# Patient Record
Sex: Male | Born: 1940 | Race: White | Hispanic: No | Marital: Married | Smoking: Current every day smoker
Health system: Southern US, Community
[De-identification: ages and names within clinical notes are randomized; demographics above are authoritative.]

## PROBLEM LIST (undated history)

## (undated) DIAGNOSIS — G4733 Obstructive sleep apnea (adult) (pediatric): Secondary | ICD-10-CM

## (undated) DIAGNOSIS — F4310 Post-traumatic stress disorder, unspecified: Secondary | ICD-10-CM

## (undated) DIAGNOSIS — R058 Other specified cough: Secondary | ICD-10-CM

## (undated) DIAGNOSIS — I2510 Atherosclerotic heart disease of native coronary artery without angina pectoris: Secondary | ICD-10-CM

## (undated) DIAGNOSIS — Z8679 Personal history of other diseases of the circulatory system: Secondary | ICD-10-CM

## (undated) DIAGNOSIS — J449 Chronic obstructive pulmonary disease, unspecified: Secondary | ICD-10-CM

## (undated) DIAGNOSIS — Z951 Presence of aortocoronary bypass graft: Secondary | ICD-10-CM

## (undated) DIAGNOSIS — T4145XA Adverse effect of unspecified anesthetic, initial encounter: Secondary | ICD-10-CM

## (undated) DIAGNOSIS — F419 Anxiety disorder, unspecified: Secondary | ICD-10-CM

## (undated) DIAGNOSIS — G629 Polyneuropathy, unspecified: Secondary | ICD-10-CM

## (undated) DIAGNOSIS — I10 Essential (primary) hypertension: Secondary | ICD-10-CM

## (undated) DIAGNOSIS — R443 Hallucinations, unspecified: Secondary | ICD-10-CM

## (undated) DIAGNOSIS — I5020 Unspecified systolic (congestive) heart failure: Secondary | ICD-10-CM

## (undated) DIAGNOSIS — Z8659 Personal history of other mental and behavioral disorders: Secondary | ICD-10-CM

## (undated) DIAGNOSIS — Z95828 Presence of other vascular implants and grafts: Secondary | ICD-10-CM

## (undated) DIAGNOSIS — C3490 Malignant neoplasm of unspecified part of unspecified bronchus or lung: Secondary | ICD-10-CM

## (undated) DIAGNOSIS — N1831 Chronic kidney disease, stage 3a: Secondary | ICD-10-CM

## (undated) DIAGNOSIS — R351 Nocturia: Secondary | ICD-10-CM

## (undated) DIAGNOSIS — R05 Cough: Secondary | ICD-10-CM

## (undated) DIAGNOSIS — F32A Depression, unspecified: Secondary | ICD-10-CM

## (undated) DIAGNOSIS — E785 Hyperlipidemia, unspecified: Secondary | ICD-10-CM

## (undated) DIAGNOSIS — J9610 Chronic respiratory failure, unspecified whether with hypoxia or hypercapnia: Secondary | ICD-10-CM

## (undated) DIAGNOSIS — I779 Disorder of arteries and arterioles, unspecified: Secondary | ICD-10-CM

## (undated) DIAGNOSIS — H9193 Unspecified hearing loss, bilateral: Secondary | ICD-10-CM

## (undated) DIAGNOSIS — F17200 Nicotine dependence, unspecified, uncomplicated: Secondary | ICD-10-CM

## (undated) DIAGNOSIS — I340 Nonrheumatic mitral (valve) insufficiency: Secondary | ICD-10-CM

## (undated) DIAGNOSIS — M1990 Unspecified osteoarthritis, unspecified site: Secondary | ICD-10-CM

## (undated) DIAGNOSIS — Z973 Presence of spectacles and contact lenses: Secondary | ICD-10-CM

## (undated) DIAGNOSIS — F329 Major depressive disorder, single episode, unspecified: Secondary | ICD-10-CM

## (undated) DIAGNOSIS — K08109 Complete loss of teeth, unspecified cause, unspecified class: Secondary | ICD-10-CM

## (undated) DIAGNOSIS — T8859XA Other complications of anesthesia, initial encounter: Secondary | ICD-10-CM

## (undated) DIAGNOSIS — I480 Paroxysmal atrial fibrillation: Secondary | ICD-10-CM

## (undated) DIAGNOSIS — K000 Anodontia: Secondary | ICD-10-CM

## (undated) DIAGNOSIS — Z86718 Personal history of other venous thrombosis and embolism: Secondary | ICD-10-CM

## (undated) HISTORY — PX: FEMORAL ARTERY - POPLITEAL ARTERY BYPASS GRAFT: SUR180

## (undated) HISTORY — DX: Personal history of other venous thrombosis and embolism: Z86.718

## (undated) HISTORY — DX: Hyperlipidemia, unspecified: E78.5

## (undated) HISTORY — DX: Essential (primary) hypertension: I10

## (undated) HISTORY — DX: Malignant neoplasm of unspecified part of unspecified bronchus or lung: C34.90

## (undated) HISTORY — PX: COLONOSCOPY: SHX174

## (undated) HISTORY — PX: HYDROCELE EXCISION: SHX482

## (undated) HISTORY — DX: Presence of aortocoronary bypass graft: Z95.1

## (undated) HISTORY — DX: Major depressive disorder, single episode, unspecified: F32.9

## (undated) HISTORY — DX: Atherosclerotic heart disease of native coronary artery without angina pectoris: I25.10

## (undated) HISTORY — PX: CARDIOVASCULAR STRESS TEST: SHX262

## (undated) HISTORY — PX: VIDEO ASSISTED THORACOSCOPY (VATS)/WEDGE RESECTION: SHX6174

## (undated) HISTORY — PX: OTHER SURGICAL HISTORY: SHX169

## (undated) HISTORY — PX: NM MYOVIEW LTD: HXRAD82

## (undated) HISTORY — DX: Disorder of arteries and arterioles, unspecified: I77.9

## (undated) HISTORY — PX: TONSILLECTOMY: SUR1361

## (undated) HISTORY — DX: Depression, unspecified: F32.A

## (undated) HISTORY — DX: Nicotine dependence, unspecified, uncomplicated: F17.200

---

## 2002-07-08 DIAGNOSIS — Z951 Presence of aortocoronary bypass graft: Secondary | ICD-10-CM | POA: Insufficient documentation

## 2002-07-08 DIAGNOSIS — I2510 Atherosclerotic heart disease of native coronary artery without angina pectoris: Secondary | ICD-10-CM

## 2002-07-08 HISTORY — DX: Atherosclerotic heart disease of native coronary artery without angina pectoris: I25.10

## 2002-07-08 HISTORY — DX: Presence of aortocoronary bypass graft: Z95.1

## 2002-07-13 ENCOUNTER — Encounter: Payer: Self-pay | Admitting: Cardiovascular Disease

## 2002-07-13 ENCOUNTER — Ambulatory Visit (HOSPITAL_COMMUNITY): Admission: RE | Admit: 2002-07-13 | Discharge: 2002-07-13 | Payer: Self-pay | Admitting: Cardiovascular Disease

## 2002-07-13 HISTORY — PX: CARDIAC CATHETERIZATION: SHX172

## 2002-07-23 ENCOUNTER — Encounter: Payer: Self-pay | Admitting: Thoracic Surgery (Cardiothoracic Vascular Surgery)

## 2002-07-27 ENCOUNTER — Inpatient Hospital Stay (HOSPITAL_COMMUNITY)
Admission: RE | Admit: 2002-07-27 | Discharge: 2002-07-31 | Payer: Self-pay | Admitting: Thoracic Surgery (Cardiothoracic Vascular Surgery)

## 2002-07-27 ENCOUNTER — Encounter: Payer: Self-pay | Admitting: Thoracic Surgery (Cardiothoracic Vascular Surgery)

## 2002-07-27 HISTORY — PX: CORONARY ARTERY BYPASS GRAFT: SHX141

## 2002-07-28 ENCOUNTER — Encounter: Payer: Self-pay | Admitting: Thoracic Surgery (Cardiothoracic Vascular Surgery)

## 2002-07-29 ENCOUNTER — Encounter: Payer: Self-pay | Admitting: Thoracic Surgery (Cardiothoracic Vascular Surgery)

## 2002-07-30 ENCOUNTER — Encounter: Payer: Self-pay | Admitting: Thoracic Surgery (Cardiothoracic Vascular Surgery)

## 2002-08-20 ENCOUNTER — Encounter
Admission: RE | Admit: 2002-08-20 | Discharge: 2002-08-20 | Payer: Self-pay | Admitting: Thoracic Surgery (Cardiothoracic Vascular Surgery)

## 2002-08-20 ENCOUNTER — Encounter: Payer: Self-pay | Admitting: Thoracic Surgery (Cardiothoracic Vascular Surgery)

## 2007-12-25 ENCOUNTER — Emergency Department (HOSPITAL_BASED_OUTPATIENT_CLINIC_OR_DEPARTMENT_OTHER): Admission: EM | Admit: 2007-12-25 | Discharge: 2007-12-25 | Payer: Self-pay | Admitting: Emergency Medicine

## 2008-09-08 ENCOUNTER — Ambulatory Visit: Payer: Self-pay | Admitting: *Deleted

## 2008-09-16 ENCOUNTER — Ambulatory Visit (HOSPITAL_COMMUNITY): Admission: RE | Admit: 2008-09-16 | Discharge: 2008-09-16 | Payer: Self-pay | Admitting: *Deleted

## 2008-09-16 ENCOUNTER — Ambulatory Visit: Payer: Self-pay | Admitting: *Deleted

## 2008-09-29 ENCOUNTER — Ambulatory Visit: Payer: Self-pay | Admitting: *Deleted

## 2008-10-24 ENCOUNTER — Inpatient Hospital Stay (HOSPITAL_COMMUNITY): Admission: RE | Admit: 2008-10-24 | Discharge: 2008-10-27 | Payer: Self-pay | Admitting: *Deleted

## 2008-10-24 ENCOUNTER — Ambulatory Visit: Payer: Self-pay | Admitting: *Deleted

## 2008-10-25 ENCOUNTER — Encounter (INDEPENDENT_AMBULATORY_CARE_PROVIDER_SITE_OTHER): Payer: Self-pay | Admitting: *Deleted

## 2008-11-10 ENCOUNTER — Ambulatory Visit: Payer: Self-pay | Admitting: *Deleted

## 2009-01-19 ENCOUNTER — Ambulatory Visit: Payer: Self-pay | Admitting: *Deleted

## 2009-04-13 ENCOUNTER — Ambulatory Visit: Payer: Self-pay | Admitting: Vascular Surgery

## 2009-07-27 ENCOUNTER — Ambulatory Visit: Payer: Self-pay | Admitting: Vascular Surgery

## 2009-09-28 ENCOUNTER — Ambulatory Visit: Payer: Self-pay | Admitting: Vascular Surgery

## 2009-11-03 ENCOUNTER — Ambulatory Visit: Payer: Self-pay | Admitting: Vascular Surgery

## 2010-02-20 ENCOUNTER — Ambulatory Visit: Payer: Self-pay | Admitting: Vascular Surgery

## 2010-02-23 ENCOUNTER — Ambulatory Visit: Payer: Self-pay | Admitting: Vascular Surgery

## 2010-02-23 ENCOUNTER — Ambulatory Visit (HOSPITAL_COMMUNITY): Admission: RE | Admit: 2010-02-23 | Discharge: 2010-02-23 | Payer: Self-pay | Admitting: Vascular Surgery

## 2010-02-23 ENCOUNTER — Encounter: Payer: Self-pay | Admitting: Vascular Surgery

## 2010-03-02 ENCOUNTER — Inpatient Hospital Stay (HOSPITAL_COMMUNITY): Admission: RE | Admit: 2010-03-02 | Discharge: 2010-03-04 | Payer: Self-pay | Admitting: Vascular Surgery

## 2010-03-02 HISTORY — PX: OTHER SURGICAL HISTORY: SHX169

## 2010-03-20 ENCOUNTER — Ambulatory Visit: Payer: Self-pay | Admitting: Vascular Surgery

## 2010-07-29 ENCOUNTER — Encounter: Payer: Self-pay | Admitting: Specialist

## 2010-09-20 LAB — PROTIME-INR: Prothrombin Time: 13.3 seconds (ref 11.6–15.2)

## 2010-09-20 LAB — BASIC METABOLIC PANEL
BUN: 13 mg/dL (ref 6–23)
CO2: 23 mEq/L (ref 19–32)
GFR calc Af Amer: >60 mL/min (ref >60)
Glucose, Bld: 110 mg/dL — ABNORMAL HIGH (ref 70–99)
Potassium: 3.6 mEq/L (ref 3.5–5.1)

## 2010-09-20 LAB — COMPREHENSIVE METABOLIC PANEL
ALT: 35 U/L (ref 0–53)
AST: 38 U/L — ABNORMAL HIGH (ref 0–37)
BUN: 15 mg/dL (ref 6–23)
GFR calc Af Amer: >60 mL/min (ref >60)
GFR calc non Af Amer: >60 mL/min (ref >60)
Glucose, Bld: 132 mg/dL — ABNORMAL HIGH (ref 70–99)
Potassium: 3.3 mEq/L — ABNORMAL LOW (ref 3.5–5.1)
Total Bilirubin: 0.4 mg/dL (ref 0.3–1.2)

## 2010-09-20 LAB — POCT I-STAT, CHEM 8
Chloride: 106 mEq/L (ref 96–112)
Creatinine, Ser: 0.9 mg/dL (ref 0.4–1.5)
Glucose, Bld: 109 mg/dL — ABNORMAL HIGH (ref 70–99)
HCT: 50 % (ref 39.0–52.0)
Hemoglobin: 17 g/dL (ref 13.0–17.0)
Potassium: 3.9 mEq/L (ref 3.5–5.1)
Sodium: 139 mEq/L (ref 135–145)

## 2010-09-20 LAB — CBC
HCT: 39.6 % (ref 39.0–52.0)
HCT: 45.5 % (ref 39.0–52.0)
Hemoglobin: 13.2 g/dL (ref 13.0–17.0)
MCH: 32.1 pg (ref 26.0–34.0)
MCHC: 33.3 g/dL (ref 30.0–36.0)
RBC: 4.75 MIL/uL (ref 4.22–5.81)
WBC: 9.3 10*3/uL (ref 4.0–10.5)

## 2010-09-20 LAB — APTT: aPTT: 29 seconds (ref 24–37)

## 2010-09-20 LAB — SURGICAL PCR SCREEN: MRSA, PCR: NEGATIVE

## 2010-09-20 LAB — TYPE AND SCREEN

## 2010-09-20 LAB — URINALYSIS, ROUTINE W REFLEX MICROSCOPIC
Glucose, UA: NEGATIVE mg/dL
Specific Gravity, Urine: 1.015 (ref 1.005–1.030)

## 2010-10-05 ENCOUNTER — Inpatient Hospital Stay (HOSPITAL_COMMUNITY)
Admission: AD | Admit: 2010-10-05 | Discharge: 2010-10-08 | DRG: 253 | Disposition: A | Discharge status: Home / Self Care | Payer: Medicare Other | Source: Ambulatory Visit | Attending: Vascular Surgery | Admitting: Vascular Surgery

## 2010-10-05 ENCOUNTER — Ambulatory Visit (INDEPENDENT_AMBULATORY_CARE_PROVIDER_SITE_OTHER): Payer: Medicare Other | Admitting: Vascular Surgery

## 2010-10-05 ENCOUNTER — Encounter (INDEPENDENT_AMBULATORY_CARE_PROVIDER_SITE_OTHER): Payer: Medicare Other

## 2010-10-05 DIAGNOSIS — I2510 Atherosclerotic heart disease of native coronary artery without angina pectoris: Secondary | ICD-10-CM | POA: Diagnosis present

## 2010-10-05 DIAGNOSIS — I82819 Embolism and thrombosis of superficial veins of unspecified lower extremities: Secondary | ICD-10-CM | POA: Diagnosis present

## 2010-10-05 DIAGNOSIS — Z01810 Encounter for preprocedural cardiovascular examination: Secondary | ICD-10-CM

## 2010-10-05 DIAGNOSIS — F17200 Nicotine dependence, unspecified, uncomplicated: Secondary | ICD-10-CM | POA: Diagnosis present

## 2010-10-05 DIAGNOSIS — I70219 Atherosclerosis of native arteries of extremities with intermittent claudication, unspecified extremity: Secondary | ICD-10-CM

## 2010-10-05 DIAGNOSIS — J449 Chronic obstructive pulmonary disease, unspecified: Secondary | ICD-10-CM | POA: Diagnosis present

## 2010-10-05 DIAGNOSIS — Z48812 Encounter for surgical aftercare following surgery on the circulatory system: Secondary | ICD-10-CM

## 2010-10-05 DIAGNOSIS — J4489 Other specified chronic obstructive pulmonary disease: Secondary | ICD-10-CM | POA: Diagnosis present

## 2010-10-05 DIAGNOSIS — I7092 Chronic total occlusion of artery of the extremities: Secondary | ICD-10-CM

## 2010-10-05 DIAGNOSIS — Z7982 Long term (current) use of aspirin: Secondary | ICD-10-CM

## 2010-10-05 DIAGNOSIS — Z01812 Encounter for preprocedural laboratory examination: Secondary | ICD-10-CM

## 2010-10-05 LAB — BASIC METABOLIC PANEL: GFR calc Af Amer: >60 mL/min (ref >60)

## 2010-10-05 LAB — CBC
HCT: 44.9 % (ref 39.0–52.0)
Hemoglobin: 15.9 g/dL (ref 13.0–17.0)
MCHC: 35.4 g/dL (ref 30.0–36.0)
MCV: 93.7 fL (ref 78.0–100.0)
RBC: 4.79 MIL/uL (ref 4.22–5.81)

## 2010-10-06 ENCOUNTER — Inpatient Hospital Stay (HOSPITAL_COMMUNITY): Payer: Medicare Other

## 2010-10-06 DIAGNOSIS — I743 Embolism and thrombosis of arteries of the lower extremities: Secondary | ICD-10-CM

## 2010-10-06 DIAGNOSIS — T82898A Other specified complication of vascular prosthetic devices, implants and grafts, initial encounter: Secondary | ICD-10-CM

## 2010-10-06 LAB — CBC
HCT: 42.4 % (ref 39.0–52.0)
MCH: 32.5 pg (ref 26.0–34.0)
MCV: 93.8 fL (ref 78.0–100.0)
Platelets: 130 10*3/uL — ABNORMAL LOW (ref 150–400)
RDW: 13.9 % (ref 11.5–15.5)

## 2010-10-06 LAB — HEPARIN LEVEL (UNFRACTIONATED): Heparin Unfractionated: 0.58 IU/mL (ref 0.30–0.70)

## 2010-10-07 LAB — CBC
MCH: 31.4 pg (ref 26.0–34.0)
MCHC: 33.1 g/dL (ref 30.0–36.0)
Platelets: 117 10*3/uL — ABNORMAL LOW (ref 150–400)
RBC: 4.14 MIL/uL — ABNORMAL LOW (ref 4.22–5.81)
RDW: 14.3 % (ref 11.5–15.5)
WBC: 8.5 10*3/uL (ref 4.0–10.5)

## 2010-10-07 LAB — BASIC METABOLIC PANEL
BUN: 15 mg/dL (ref 6–23)
Calcium: 8.4 mg/dL (ref 8.4–10.5)
Creatinine, Ser: 0.92 mg/dL (ref 0.4–1.5)
GFR calc non Af Amer: >60 mL/min (ref >60)
Glucose, Bld: 114 mg/dL — ABNORMAL HIGH (ref 70–99)
Potassium: 4.2 mEq/L (ref 3.5–5.1)

## 2010-10-07 LAB — HEPARIN LEVEL (UNFRACTIONATED): Heparin Unfractionated: <0.1 IU/mL — ABNORMAL LOW (ref 0.30–0.70)

## 2010-10-08 DIAGNOSIS — M79609 Pain in unspecified limb: Secondary | ICD-10-CM

## 2010-10-08 LAB — CROSSMATCH
ABO/RH(D): A POS
Antibody Screen: NEGATIVE
Unit division: 0

## 2010-10-08 LAB — CBC
MCH: 31 pg (ref 26.0–34.0)
MCHC: 32.4 g/dL (ref 30.0–36.0)
Platelets: 115 10*3/uL — ABNORMAL LOW (ref 150–400)
RDW: 14.2 % (ref 11.5–15.5)

## 2010-10-08 NOTE — Op Note (Signed)
NAME:  John Ball, John Ball NO.:  [id number]  MEDICAL RECORD NO.:  [id number]           PATIENT TYPE:  I  LOCATION:  3308                         FACILITY:  [facility]  PHYSICIAN:  Quita Skye. Hart Rochester, M.D.  DATE OF BIRTH:  Sep 05, 1940  DATE OF PROCEDURE: DATE OF DISCHARGE:                              OPERATIVE REPORT   PREOPERATIVE DIAGNOSES:  Ischemic right leg secondary to thrombosis, right femoral popliteal Gore-Tex graft, and possibly right above-knee pop to below-knee popliteal graft.  POSTOPERATIVE DIAGNOSIS:  Thrombosis of right femoral to above-knee popliteal Gore-Tex graft.  PROCEDURE: 1. Thrombectomy of right femoral to above-knee popliteal Gore-Tex     graft. 2. Dacron patch angioplasty of Gore-Tex and saphenous vein junction     and the above-knee popliteal artery. 3. Intraoperative arteriogram  SURGEON:  Quita Skye. Hart Rochester, M.D.  FIRST ASSISTANT:  Nurse.  ANESTHESIA:  General endotracheal.  BRIEF HISTORY:  This patient originally had a right femoral to above- knee popliteal bypass graft of Gore-Tex by Dr. Madilyn Fireman a few years ago. He occluded his outflow in the above-knee popliteal artery and Dr. Arbie Cookey in August 2011 performed above knee to below-knee popliteal bypass with saphenous vein in the right leg.  There was only a single vessel runoff through peroneal artery, which appeared to be diseased.  He now returns with a 1-week history of ischemia of the right leg with severe claudication, but no motor or sensory dysfunction.  He is scheduled for an attempted thrombectomy and/or revision of the graft.  PROCEDURE:  The patient was taken to the operating room, placed in the supine position at which time, satisfactory general endotracheal anesthesia was administered.  Right leg was prepped, Betadine scrub and solution draped in routine sterile manner.  Incision was made in the distal thigh through the previous scar and the femoral-popliteal Gore- Tex graft  dissected free that exited the adductor canal and dissected down to its anastomosis to the above-knee popliteal artery.  This graft was pulseless.  There was a saphenous vein graft anastomosed to the distal hood of the Gore-Tex, which was non-pulsatile but did appear patent.  It was also dissected free as was the native popliteal artery. The patient was then heparinized.  Longitudinal opening made in the [facility] graft just proximal to the vein anastomosis and the arteriotomy was extended through the hood of the vein anastomosis down into the vein graft about 3-4 cm.  The vein graft was not thrombosed but there was significant narrowing at the junction of the vein graft to Gore-Tex at that anastomosis.  This appeared to be the cause of the thrombosis. Fogarty catheter would traverse the vein graft easily but would not traverse the distal popliteal artery meeting a rigid obstruction.  There was good backbleeding, however.  Heparin saline could be flushed under low resistance.  Fogarty was then passed proximally up the Gore-Tex graft and the graft was easily thrombectomized with excellent flow being reestablished.  A Dacron patch was then sewn into place over this long graftotomy which include the Gore-Tex and the saphenous vein.  This was done with 6-0 Prolene.  There was fairly  good backbleeding coming from the native popliteal artery.  When anastomosis had been completed and clamps released, there was an excellent pulse and Doppler flow in the vein graft.  There was monophasic flow in the ankle at the posterior tibial which was better than the peroneal and nothing in the anterior tibial.  Intraoperative arteriogram revealed widely patent Gore-Tex and vein graft into the below-knee popliteal artery with total occlusion of popliteal outflow, tibials filling by collaterals.  Adequate hemostasis was achieved without giving protamine.  Wound closed in layers with Vicryl in subcuticular  fashion with Dermabond.  The patient taken to recovery room in stable condition.  It is felt the next option for this patient would be to use veins in the contralateral leg and do a femoral to midcalf posterior tibial bypass if that vessel remains patent.     [name], M.D.     JDL/MEDQ  D:  10/06/2010  T:  10/07/2010  Job:  [id number]  Electronically Signed by Josephina Gip M.D. on 10/08/2010 10:25:49 AM

## 2010-10-09 ENCOUNTER — Ambulatory Visit: Payer: Self-pay | Admitting: Vascular Surgery

## 2010-10-11 NOTE — H&P (Signed)
HISTORY AND PHYSICAL EXAMINATION  October 05, 2010  Re:  BAER, HINTON              DOB:  30-Aug-1940  CHIEF COMPLAINT:  Right leg pain.  HISTORY OF PRESENT ILLNESS:  This is a 70 year old gentleman with previous right leg revascularization (see PSH) who presents with chief complaint of right leg pain, onset was Sunday, which he noted to be 10/10 pain that was sharp and radiating for right knee to the foot.  The patient denies any numbness or weakness in the right foot, and he notes the hue of his right foot has become progressively more blue.  Patient denies any gangrene or ulcerations.  PAST MEDICAL HISTORY:  Coronary heart disease, hypertension, COPD, tobacco abuse, peripheral vascular disease.  PAST SURGICAL HISTORY: 1. Right common femoral artery to above-knee pop bypass in April 2010. 2. Right above the knee pop to below knee pop bypass with reverse     greater saphenous vein March 03, 2011.  SOCIAL HISTORY:  He smokes a pack a day.  No alcohol or illicit drug use.  FAMILY HISTORY:  Mother died at 98 with abdominal aortic aneurysm.  The patient's father died at 68 with Alzheimer.  MEDICATIONS:  Cymbalta, Seroquel, Tricor, Crestor, Altace.  ALLERGIES:  Aspirin and Effexor, statins and __________ .  REVIEW OF SYSTEMS:  Pain in the legs when walking, pain in the legs with lying flat, depression.  Otherwise, the rest of his review of systems was noted to be negative.  PHYSICAL EXAMINATION:  Vital signs:  Blood pressure 138/82, heart rate 73 and respirations were 18. General:  Well-developed, well-nourished, no apparent distress, alert and oriented x3. HEENT:  Normocephalic, atraumatic.  Pupils were equal, round, reactive to light.  Extraocular movements are intact.  Oropharynx without any erythema or exudate. Lungs:  Clear to auscultation bilaterally with symmetric expansion. Cardiac:  Regular rate and rhythm.  Normal S1 and S2.  No murmurs,  rubs, thrills, gallops. Vascular:  Palpable bilateral upper extremity pulses,  palpable carotids without any bruits.  I could not appreciate any pulsatile midline mass in the abdomen.  Palpable femorals.  However, no palpable popliteals. The left foot had a palpable DP and PT.  The right foot had no palpable pulses. GI:  Soft abdomen, nontender, nondistended.  No guarding, no rebound, no hepatosplenomegaly.  No obvious masses. Musculoskeletal:  The leg had a dorsal flexion and plantar flexion of 4/5.  He did not have palpable pulses.  Right foot was cyanotic.  The incisions in his right leg are well-healed.  The left leg had 5/5 plantar flexion and dorsiflexion. Neurologic:  Cranial nerves II-XII were intact.  Sensation grossly intact including the right foot.  Motor was as above.  Noninvasive vascular imaging of the right lower leg demonstrates an occlusion of the right femoral popliteal bypass above knee.  The below knee pop bypass is not well visualized but presumed to be occluded.  His ABI on the right side was 0.28 and monophasic Posterior tibial.  On the left side he had a 0.93 ABI.  Triphasic flow in the posterior tibial; dorsalis pedis was monophasic.  MEDICAL DECISION MAKING:  This is a 70 year old gentleman with previous right femoral to above-knee pop bypass and recent right above-knee pop to below knee pop bypass with known single vessel runoff from a popliteal artery presents with occlusion of the fem-pop.  We are going to admit this gentleman for pain control and heparinization.  Dr. Hart Rochester is  going to take a look at the patient once he gets to the hospital to determine whether not a thrombectomy of the right fem-pop graft would be the right procedure versus proceeding with a brand new bypass of which would involve an angiogram on Monday and then subsequently a redo femoral to below knee pop bypass by Monday or Tuesday.    Fransisco Hertz, MD Electronically  Signed  BLC/MEDQ  D:  10/05/2010  T:  10/08/2010  Job:  [id number]

## 2010-10-15 NOTE — Procedures (Unsigned)
BYPASS GRAFT EVALUATION  INDICATION:  Right lower extremity pain.  HISTORY: Diabetes:  No. Cardiac:  No. Hypertension:  Yes. Smoking:  Yes. Previous Surgery:  Right fem-pop bypass graft on 10/14/2008 by Dr. Madilyn Fireman.  Right above knee to below knee popliteal bypass graft on 03/02/2010 by Dr. Arbie Cookey.  SINGLE LEVEL ARTERIAL EXAM                              RIGHT              LEFT Brachial:                    133                135 Anterior tibial:             Absent             118 Posterior tibial:            38                 126 Peroneal: Ankle/brachial index:        0.28               0.93  PREVIOUS ABI:  Date:  03/20/2010  RIGHT:  0.75  LEFT:  0.98  LOWER EXTREMITY BYPASS GRAFT DUPLEX EXAM:  DUPLEX:  No flow visualized throughout the right femoral-popliteal bypass graft or the above knee to below knee popliteal bypass graft.  IMPRESSION: 1. Right ankle brachial index suggests severe arterial disease. 2. Left ankle brachial index suggests mild arterial disease. 3. No flow noted throughout the right lower extremity bypass grafts.  ___________________________________________ Fransisco Hertz, MD  EM/MEDQ  D:  10/08/2010  T:  10/08/2010  Job:  [id number]

## 2010-10-15 NOTE — Discharge Summary (Signed)
NAME:  John Ball, SPIKES NO.:  [id number]  MEDICAL RECORD NO.:  [id number]           PATIENT TYPE:  I  LOCATION:  2019                         FACILITY:  [facility]  PHYSICIAN:  Quita Skye. Hart Rochester, M.D.  DATE OF BIRTH:  1940-12-10  DATE OF ADMISSION:  10/05/2010 DATE OF DISCHARGE:  10/08/2010                              DISCHARGE SUMMARY   ADMIT DIAGNOSIS:  Ischemic right lower extremity.  PAST MEDICAL HISTORY AND DISCHARGE DIAGNOSES: 1. Ischemic right lower extremity status post thrombectomy of right     femoral to above-the-knee popliteal bypass with Gore-Tex, Dacron     patch angioplasty of Gore-Tex and saphenous vein junction at the     above-the-knee popliteal artery. 2. Coronary artery disease. 3. Hypertension. 4. Chronic obstructive pulmonary disease. 5. Tobacco abuse. 6. Peripheral vascular disease. 7. History of right common femoral to above-the-knee popliteal bypass     graft in April 2010.8. History of right above-the-knee popliteal to below-the-knee     popliteal bypass graft to greater saphaneous vein in March 02, 2010.  ALLERGIES:  No known drug allergies.  BRIEF HISTORY:  The patient is a 70 year old male who originally had a right femoral to above-the-knee popliteal bypass graft with Gore-Tex by Dr. Madilyn Fireman in 2010.  He occluded the outflow in the above-the-knee popliteal artery and Dr. Arbie Cookey performed an above-the-knee to below-the- knee popliteal bypass with greater saphaneous vein in the right lower extremity in August 2011.  The patient returned to the office on Friday with a 1-week history of ischemia in the right lower extremity with severe claudication, but no motor or sensory dysfunction.  The patient was subsequently admitted to the hospital and scheduled for a lower extremity thrombectomy on Saturday, March 31.  HOSPITAL COURSE:  The patient was admitted from the office secondary to right lower extremity ischemia as previously stated  on October 05, 2010. The patient was then taken to the OR on October 06, 2010 for a thrombectomy of the right femoral to above-the-knee popliteal Gore-Tex graft with Dacron patch angioplasty of the Gore-Tex and saphaneous vein junction at the above-the-knee popliteal artery.  The patient tolerated the procedure well and was hemodynamically stable immediately postoperatively.  He was transferred from the OR to the Post Anesthesia Care Unit in stable condition.  The patient was extubated without complication and woke up from anesthesia neurologically intact.  The patient's postoperative course has progressed as expected.  His Foley was discontinued in a routine manner and he has been able to void without difficulty.  He is also ambulating and tolerating a regular diet.  His pain has been well controlled and he is currently taking oral pain medications only.  The patient remained afebrile with stable vital signs throughout the postoperative course.  On October 08, 2010, he is without complaint.  PHYSICAL EXAMINATION:  VITAL SIGNS:  He is afebrile with stable vital signs. CARDIAC:  Regular rate and rhythm. LUNGS:  expiratory wheeze bilateral. ABDOMEN:  Benign. EXTREMITIES:  Bilateral lower extremities are warm and well-perfused. There is palpable graft pulse.  The incisions are clean, dry, and intact.  The patient is doing well at this time and felt stable for discharge home.  LABORATORY DATA:  CBC on October 08, 2010, white count 6.7, hemoglobin 12.4, hematocrit 38.3, platelets 115.  BMP on October 07, 2010, sodium 139, potassium 4.2, BUN 15, creatinine 0.92.  DISCHARGE INSTRUCTIONS:  The patient received specific written discharge instructions regarding diet, activity, and wound care.  He will follow up with Dr. Hart Rochester in approximately 2 weeks after discharge.  The office will contact the patient with the date and time of that appointment.  DISCHARGE MEDICATIONS: 1. Percocet 5/325 mg 1-2  q4-6 hours p.r.n. pain. 2. Altace 10 mg daily. 3. Ambien CR 12.5 mg nightly. 4. Cymbalta 60 mg daily. 5. Seroquel XR 300 mg nightly.     John Leisure, PA   ______________________________ [name], M.D.    AY/MEDQ  D:  10/08/2010  T:  10/08/2010  Job:  [id number]  Electronically Signed by John Leisure PA on 10/08/2010 01:06:07 PM Electronically Signed by John Ball M.D. on 10/15/2010 10:21:24 AM

## 2010-10-17 LAB — APTT: aPTT: 28 seconds (ref 24–37)

## 2010-10-17 LAB — CBC
Platelets: 145 10*3/uL — ABNORMAL LOW (ref 150–400)
Platelets: 165 10*3/uL (ref 150–400)
RBC: 4.64 MIL/uL (ref 4.22–5.81)
RDW: 13.7 % (ref 11.5–15.5)
WBC: 5 10*3/uL (ref 4.0–10.5)
WBC: 6.6 10*3/uL (ref 4.0–10.5)

## 2010-10-17 LAB — URINALYSIS, ROUTINE W REFLEX MICROSCOPIC
Bilirubin Urine: NEGATIVE
Glucose, UA: NEGATIVE mg/dL
Ketones, ur: NEGATIVE mg/dL
Nitrite: NEGATIVE
Specific Gravity, Urine: 1.016 (ref 1.005–1.030)
pH: 6.5 (ref 5.0–8.0)

## 2010-10-17 LAB — TYPE AND SCREEN: Antibody Screen: NEGATIVE

## 2010-10-17 LAB — ABO/RH: ABO/RH(D): A POS

## 2010-10-17 LAB — COMPREHENSIVE METABOLIC PANEL
ALT: 31 U/L (ref 0–53)
AST: 28 U/L (ref 0–37)
Albumin: 4.2 g/dL (ref 3.5–5.2)
CO2: 25 mEq/L (ref 19–32)
Chloride: 104 mEq/L (ref 96–112)
Creatinine, Ser: 1.2 mg/dL (ref 0.4–1.5)
GFR calc Af Amer: >60 mL/min (ref >60)
GFR calc non Af Amer: >60 mL/min (ref >60)
Sodium: 138 mEq/L (ref 135–145)
Total Bilirubin: 0.5 mg/dL (ref 0.3–1.2)

## 2010-10-17 LAB — BASIC METABOLIC PANEL
BUN: 11 mg/dL (ref 6–23)
Calcium: 8.8 mg/dL (ref 8.4–10.5)
Creatinine, Ser: 1.04 mg/dL (ref 0.4–1.5)
GFR calc non Af Amer: >60 mL/min (ref >60)
Glucose, Bld: 119 mg/dL — ABNORMAL HIGH (ref 70–99)

## 2010-10-18 LAB — POCT I-STAT, CHEM 8
BUN: 25 mg/dL — ABNORMAL HIGH (ref 6–23)
Calcium, Ion: 1.27 mmol/L (ref 1.12–1.32)
Chloride: 104 meq/L (ref 96–112)
Creatinine, Ser: 1.3 mg/dL (ref 0.4–1.5)
Glucose, Bld: 99 mg/dL (ref 70–99)
HCT: 50 % (ref 39.0–52.0)
Hemoglobin: 17 g/dL (ref 13.0–17.0)
Potassium: 4.3 meq/L (ref 3.5–5.1)
Sodium: 140 meq/L (ref 135–145)
TCO2: 27 mmol/L (ref 0–100)

## 2010-10-23 ENCOUNTER — Ambulatory Visit: Payer: Medicare Other | Admitting: Vascular Surgery

## 2010-11-01 ENCOUNTER — Ambulatory Visit: Payer: Self-pay

## 2010-11-06 ENCOUNTER — Ambulatory Visit (INDEPENDENT_AMBULATORY_CARE_PROVIDER_SITE_OTHER): Payer: Medicare Other | Admitting: Vascular Surgery

## 2010-11-06 ENCOUNTER — Encounter (INDEPENDENT_AMBULATORY_CARE_PROVIDER_SITE_OTHER): Payer: Medicare Other

## 2010-11-06 DIAGNOSIS — Z01810 Encounter for preprocedural cardiovascular examination: Secondary | ICD-10-CM

## 2010-11-06 DIAGNOSIS — I70219 Atherosclerosis of native arteries of extremities with intermittent claudication, unspecified extremity: Secondary | ICD-10-CM

## 2010-11-06 DIAGNOSIS — I739 Peripheral vascular disease, unspecified: Secondary | ICD-10-CM

## 2010-11-07 NOTE — Assessment & Plan Note (Signed)
OFFICE VISIT  John Ball, John Ball DOB:  [birth year]                                       11/06/2010 [id number]#:[id number]  Patient presents today for follow-up of his thrombectomy revision of a right fem-pop bypass.  He has a complex past history.  He initially underwent a right femoral to above-knee popliteal bypass by Dr. Liliane Bade.  He had recurrent ischemia and underwent an above-knee to below- knee popliteal bypass.  The initial operation was in 2010.  He subsequently underwent above-knee popliteal to below-knee popliteal bypass by myself with saphenous vein in August 2011.  He presented on March 30th with a 5-day history of progressive ischemia in his right foot and had occluded his Gore-Tex fem-pop.  He had maintained patency of his above-knee to below-knee popliteal bypass.  He underwent thrombectomy and patch angioplasty revision of his graft by Dr. Hart Rochester. He was discharged to home and is here today for follow-up.  He does have a palpable popliteal pulse and a well-perfused foot.  His wounds are all healing satisfactorily.  He did undergo vein mapping of his left leg today to determine if he had an adequate vein for bypass from vein in his left leg.  This did show patency with adequate size of his vein.  I had a long discussion with patient and his wife present.  I recommended against any elective revision.  He does have currently a good flow to his foot.  I explained that if he does have a recurrent occlusion, then I would recommend consideration of a redo right fem-pop with the saphenous veins in the left leg.  He understands this and will see [place] again in 3 months for continued duplex follow-up.    Larina Earthly, M.D. Electronically Signed  TFE/MEDQ  D:  11/06/2010  T:  11/07/2010  Job:  [id number]

## 2010-11-15 NOTE — Procedures (Unsigned)
VASCULAR LAB EXAM  INDICATION:  Left lower extremity vein mapping.  HISTORY: Diabetes.  No. Cardiac:  CABG. Hypertension:  Yes.  EXAM:  Left lower extremity vein mapping.  IMPRESSION: 1. The left greater saphenous vein is compressible from the proximal     thigh to the distal calf level with diameter measurements ranging     from 0.47 to 0.35 cm. 2. 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    ___________________________________________ Larina Earthly, M.D.  CH/MEDQ  D:  11/06/2010  T:  11/06/2010  Job:  [id number]

## 2010-11-20 NOTE — Procedures (Signed)
BYPASS GRAFT EVALUATION   INDICATION:  Followup evaluation of lower extremity bypass graft.   HISTORY:  Diabetes:  No.  Cardiac:  No.  Hypertension:  Yes.  Smoking:  Yes.  Previous Surgery:  Right fem-pop bypass graft on 10/24/2008 by Dr.  Madilyn Fireman.   SINGLE LEVEL ARTERIAL EXAM                               RIGHT              LEFT  Brachial:                    121                121  Anterior tibial:             45                 122  Posterior tibial:            74                 134  Peroneal:  Ankle/brachial index:        0.61               1.11   PREVIOUS ABI:  Date:  11/10/2008  RIGHT:  0.65  LEFT:  0.96   LOWER EXTREMITY BYPASS GRAFT DUPLEX EXAM:   DUPLEX:  Biphasic duplex waveform noted within graft and native artery.   IMPRESSION:  1. Patent right fem-pop bypass graft with no evidence of focal      stenosis.  2. Right lower extremity ABI suggests moderate arterial disease with      biphasic Doppler waveform.  3. Normal left lower extremity ABI with biphasic and monophasic      Doppler waveform.   ___________________________________________  P. Liliane Bade, M.D.   AC/MEDQ  D:  01/19/2009  T:  01/19/2009  Job:  [id number]

## 2010-11-20 NOTE — H&P (Signed)
NAME:  John, Ball NO.:  [id number]   MEDICAL RECORD NO.:  [id number]          PATIENT TYPE:  INP   LOCATION:  3314                         FACILITY:  [facility]   PHYSICIAN:  Balinda Quails, M.D.    DATE OF BIRTH:  [birth year]   DATE OF ADMISSION:  10/24/2008  DATE OF DISCHARGE:                              HISTORY & PHYSICAL   PRIMARY CARE PHYSICIAN:  Dr. Mechele Claude.   ADMISSION DIAGNOSIS:  Ischemic rest pain right lower extremity.   HISTORY:  John Ball is a 70 year old male with a long history of  right calf discomfort with ambulation.  He has noted numbness in his  right foot and a cold feeling in his toes.  His toes are noted to be  cyanotic at times.  His symptoms have been worsening over the last 6-8  weeks.  He now describes some pain in his right foot at rest and at  night.  This often awakens him during the night.   He smokes one-pack of cigarettes per day with a history of heavy tobacco  use in the past.  His risk factors for atherosclerotic disease include  hypertension, coronary artery disease and tobacco use.   Lower extremity Dopplers reveal ankle brachial indices of 1.0 on the  left and 0.5 on the right.   PAST MEDICAL HISTORY:  1. Coronary artery disease status post coronary artery bypass.  2. Hypertension.  3. Chronic obstructive pulmonary disease.  4. Tobacco abuse.   MEDICATIONS:  1. Altace 10 mg daily.  2. Aspirin 81 mg daily.  3. Cymbalta 60 mg daily.  4. Seroquel XR 300 mg at bedtime.  5. Ambien CR 12.5 mg at bedtime.  6. TriCor 145 mg each evening.   ALLERGIES:  None known.   SOCIAL HISTORY:  The patient is married with two children.  He has spent  20 years in [employer] and retired from [employer] work.  Now fully  retired.  He smokes one-pack of cigarettes daily.  No regular alcohol  use.   FAMILY HISTORY:  Mother deceased at age 69 of complications of an aortic  aneurysm.  Father deceased at age 29 of Alzheimer  disease.   REVIEW OF SYSTEMS:  The patient notes chronic feelings of depression.   PHYSICAL EXAMINATION:  GENERAL:  Moderately obese 70 year old male.  No  acute distress.  Alert and oriented.  VITAL SIGNS:  BP is 120/76, pulse is 77 per minute, respirations 18 per  minute.  HEENT:  Mouth and throat are clear.  Normocephalic.  NECK:  Supple.  No thyromegaly or adenopathy.  CARDIOVASCULAR:  No carotid bruits.  Normal heart sounds without  murmurs.  Regular rate and rhythm.  No gallops or rubs.  CHEST:  Equal  air entry bilaterally.  Distant breath sounds.  No rales or rhonchi.  ABDOMEN:  Soft, nontender.  No masses, organomegaly.  Normal bowel  sounds without bruits.  LOWER EXTREMITIES:  Femoral pulses 2+ bilaterally.  Left popliteal,  posterior tibial, and dorsalis pedis pulses are 2+.  Absent right  popliteal, posterior tibial, and dorsalis pedis pulses.  Cyanosis is noted of the right foot.  No ulceration or gangrene.  NEUROLOGIC:  Cranial nerves intact.  Strength equal bilaterally.  Reflexes 1+.   INVESTIGATIONS:  Lower extremity arteriography carried out revealed  evidence of extensive long segment occlusion right superficial femoral  artery, reconstitution of the popliteal artery, anterior tibial  occlusion and subtotal occlusion of the tibioperoneal trunk.   IMPRESSION:  1. Ischemic rest pain right foot.  2. Coronary artery disease.  3. Hypertension.  4. Chronic obstructive pulmonary disease.  5. Tobacco abuse.   PLAN:  Elective admission at [facility] for right femoral  posterior tibial bypass with left lower extremity greater saphenous  vein.      Balinda Quails, M.D.  Electronically Signed     PGH/MEDQ  D:  10/24/2008  T:  10/24/2008  Job:  [id number]   cc:   Dr. Mechele Claude

## 2010-11-20 NOTE — Assessment & Plan Note (Signed)
OFFICE VISIT   John Ball, John Ball  DOB:  15-Nov-1940                                       03/20/2010  [id number]#:[id number]   Patient presents today for follow-up of his right above-knee popliteal  graft to below-knee popliteal artery bypass with reverse saphenous vein  on 03/02/2010.  He has the usual amount of peri-incisional soreness.  He  does have a complaint of pain from his lateral thigh from his hip down  towards his knee.  He reports that this is more significant following  surgery.  He does have a history of severe lumbar disk disease, and this  may be related.  I explained that this would not be related to arterial  insufficiency since he has normal flow to the femoral level.   His incisions are all healing quite nicely with subcuticular closure.  He does have a palpable popliteal graft pulse.  Ankle arm index is  improved to 0.75, up from 0.6 preoperatively.   He will continue his walking program, and we will see him again in 3  months for continued follow-up.  He will notify us should he develop any  wound problems or signs of acute ischemia to his right foot.     Larina Earthly, M.D.  Electronically Signed   TFE/MEDQ  D:  03/20/2010  T:  03/20/2010  Job:  [id number]   cc:   Dr. Mechele Claude

## 2010-11-20 NOTE — Procedures (Signed)
DUPLEX DEEP VENOUS EXAM - LOWER EXTREMITY   INDICATION:  Right leg/foot pain.   HISTORY:  Edema:  Yes  Trauma/Surgery:  No  Pain:  Yes  PE:  No  Previous DVT:  No  Anticoagulants:  No  Other:   DUPLEX EXAM:                CFV   SFV   PopV  PTV    GSV                R  L  R  L  R  L  R   L  R  L  Thrombosis    o  o  o     o     o      o  Spontaneous   +  +  +     +     +      +  Phasic        +  +  +     +     +      +  Augmentation  +  +  +     +     +      +  Compressible  +  +  +     +     +      +  Competent     +  +  p     +     +      +   Legend:  + - yes  o - no  p - partial  D - decreased   IMPRESSION:  The right leg appears free from deep vein thrombus.  Mild  reflux was noted in the mid right superficial femoral artery.    _____________________________  Di Kindle. Edilia Bo, M.D.   CB/MEDQ  D:  09/29/2009  T:  09/29/2009  Job:  [id number]

## 2010-11-20 NOTE — Assessment & Plan Note (Signed)
OFFICE VISIT   GRANGER, CHUI  DOB:  1941/02/27                                       11/03/2009  NWGNF#:[id number]   CHIEF COMPLAINT:  Claudication symptoms and rest pain in the right calf  and foot.   DATE OF SURGERY:  11/03/2008.  He had a right fem-to-above-knee  popliteal bypass with Gore-Tex graft by Dr. Madilyn Fireman.   HISTORY OF PRESENT ILLNESS:  Patient is a 70 year old gentleman with a  history of coronary artery disease, hypertension, high cholesterol, COPD  and emphysema who had a heart bypass in 2004 and the right fem-pop  bypass in 2010, as described above.  He states that for the past month  he has had worsening symptoms of calf pain when he stands for greater  than 20 minutes.  He also has hip pain at the level of 10.  When he  stands, he complains of rest pain in the right foot from the mid foot to  the toes.  He claudicates at approximately a block with pain in the  calf.  Of note, he also has a history of low back pain which is being  worked up at this time.   Medications include Cymbalta, Seroquel, Tricor, and Crestor as well as  Altace.  Please see discharge summary from 10/27/2009 for the dosages of  those medications.  He is no longer taking the tramadol, Percocet,  Lyrica, nicotine patch, or baby aspirin.   REVIEW OF SYSTEMS:  He denies weight loss or weight gain, fever or  chills.  CARDIAC:  He has shortness of breath with exertion.  PULMONARY:  He has wheezing.  GI:  Denies black stools, bloody stools, ulcers, or reflux.  GU:  He denies any chronic renal sufficiency, burning or pain with  urination.  VASCULAR:  As above.  NEUROLOGIC:  He denies any had dizziness, blackouts, headaches or  seizures.  MUSCULOSKELETAL:  He has low back pain and hip pain on the right side.  PSYCHIATRIC:  He has depression.  HEMATOLOGY:  He denies any bleeding or clotting disorders.   SOCIAL HISTORY:  He is married with 2 children.  He smokes 1  pack of  cigarettes per day, and he does not drink any alcohol on a regular  basis.   Vascular lab showed that the right fem-pop graft was patent, but the  popliteal artery was occluded distal to the graft.  His ABIs have been  essentially unchanged with 0.57.  They were 0.62 in January of this  year.   PHYSICAL EXAMINATION:  VITAL SIGNS:  Heart rate was 89.  Blood pressure  was 149/92, and sats were 98.  HEENT:  He had a ruddy complexion.  HEENT is grossly within normal  limits.  His lungs had rhonchi bilaterally with minimal expiratory  wheeze.  Cardiac:  He had no carotid bruits.  Heart:  Rate and rhythm  was regular.  Abdomen was obese.  Vascular:  He had both his feet showed  signs of cyanosis in the toes.  He had pulses which were palpable, both  femoral arteries.  He had Doppler signal in both posterior tibial  arteries and no Doppler signal in the DPs.  He had well-healed scar in  the right leg and also scars from the vein harvest from that leg.   He has no known drug  allergies.   ASSESSMENT/PLAN:  The patient was seen by Dr. Arbie Cookey in consultation, and  it was felt that his calf and foot symptoms were probably from the  distal occlusion of the popliteal artery, but as the ABIs were  unchanged, it was felt that the patient should follow up in 6 weeks with  Dr. Arbie Cookey.  If his symptoms worsen prior to that, he will let us know  and we will schedule an angiogram and then possible bypass surgery.  Of note, the patient was advised that the hip pain was probably not due  to vascular issues and more than likely from his back issues.  It was  also stated that any bypass that was done would probably improve his  calf and foot pain but would not to relieve the symptoms of his hip  pain.   Della Goo, PA-C   Larina Earthly, M.D.  Electronically Signed   RR/MEDQ  D:  11/03/2009  T:  11/03/2009  Job:  [id number]   cc:   Dr. Mechele Claude

## 2010-11-20 NOTE — Procedures (Signed)
BYPASS GRAFT EVALUATION   INDICATION:  Followup evaluation of right lower extremity bypass graft.   HISTORY:  Diabetes:  No.  Cardiac:  No.  Hypertension:  Yes.  Smoking:  Yes.  Previous Surgery:  Right femoral-popliteal bypass graft on 10/24/2008 by  Dr. Madilyn Fireman.   SINGLE LEVEL ARTERIAL EXAM                               RIGHT              LEFT  Brachial:                    133                138  Anterior tibial:             86                 126  Posterior tibial:            81                 126  Peroneal:  Ankle/brachial index:        0.62               1.00   PREVIOUS ABI:  Date:  04/13/2009  RIGHT:  0.67  LEFT:  1.10   LOWER EXTREMITY BYPASS GRAFT DUPLEX EXAM:   DUPLEX:  Patent right femoral-popliteal bypass graft with biphasic  waveforms proximal, within and distal to the graft.  There is no focal  stenosis noted.   IMPRESSION:  1. Stable ankle brachial indices noted from previous study.  2. Patent right femoral-popliteal bypass graft.   ___________________________________________  Larina Earthly, M.D.   CB/MEDQ  D:  07/28/2009  T:  07/28/2009  Job:  [id number]

## 2010-11-20 NOTE — Procedures (Signed)
BYPASS GRAFT EVALUATION   INDICATION:  Follow up right lower extremity bypass graft.   HISTORY:  Diabetes:  No.  Cardiac:  No.  Hypertension:  Yes.  Smoking:  Yes.  Previous Surgery:  Right fem-pop bypass graft on 10/24/2008 by Dr.  Madilyn Fireman.   SINGLE LEVEL ARTERIAL EXAM                               RIGHT              LEFT  Brachial:                    136                133  Anterior tibial:             66                 138  Posterior tibial:            78                 138  Peroneal:  Ankle/brachial index:        0.57               1.01   PREVIOUS ABI:  Date:  07/27/2009  RIGHT:  0.62  LEFT:  1.0   LOWER EXTREMITY BYPASS GRAFT DUPLEX EXAM:   DUPLEX:  Biphasic Doppler waveforms noted throughout the right lower  extremity bypass graft with a total occlusion of the right mid popliteal  artery noted.   IMPRESSION:  1. Patent right femoropopliteal bypass graft with no evidence of      stenosis.  2. Totally occluded right popliteal artery.  3. Stable bilateral ankle brachial indices.        ___________________________________________  Di Kindle. Edilia Bo, M.D.   CH/MEDQ  D:  11/03/2009  T:  11/03/2009  Job:  [id number]

## 2010-11-20 NOTE — Assessment & Plan Note (Signed)
OFFICE VISIT   JOUD, INGWERSEN  DOB:  1941-03-27                                       11/10/2008  ZOXWR#:[id number]   The patient underwent right femoral popliteal bypass with 6 mm Gore-Tex  graft carried out 10/24/2008 at [facility].  He returns at this  time for postoperative evaluation.  Right ABI is improved to 0.65.  No  further rest pain.   His claudication symptoms have improved although are not entirely  relieved.   Incisions are healing well.  Mild swelling right lower extremity.  BP is  149/83, pulse 73 per minute.   The patient is doing well following his recent surgery.  Will plan  followup with him again in July with ABIs and graft scan.   Balinda Quails, M.D.  Electronically Signed   PGH/MEDQ  D:  11/10/2008  T:  11/11/2008  Job:  [id number]   cc:   Dr Mechele Claude

## 2010-11-20 NOTE — Assessment & Plan Note (Signed)
OFFICE VISIT   John Ball, John Ball  DOB:  March 12, 1941                                       02/20/2010  ZOXWR#:[id number]   Patient presents today for continued discussion regarding his right leg  ischemia.  I had seen him in April, 2011, at which time he was having  worsening claudication in his right leg.  He had recent noninvasive  vascular laboratory studies at that time which had revealed a patent  right femoral to above-knee popliteal bypass with a Gore-Tex graft but  an occlusion of the popliteal artery below this.  He was having  worsening claudication but at that time it was tolerable.  He presents  today with progressive claudication symptoms and is now having a classic  arterial rest pain in his right foot.  He reports that he is unable to  tolerate this degree of pain, both rest pain and also severe  claudication that limits him from being able to walk.   He does have an extensive past medical history which is documented in  our chart.  He does have a prior coronary bypass grafting in 2004.  In  reviewing Dr. Madilyn Fireman' preoperative evaluation and operative notes, his  initial plan was to harvest a left leg vein for femoral to distal  bypass.  At the time of surgery he elected to place a right femoral to  above-knee popliteal bypass with Gore-Tex graft, saving the left leg  vein.   On physical exam, patient is a well-developed white male appearing his  stated age in no acute distress.  Blood pressure 138/78, pulse 75,  respirations 18.  HEENT is normal except for poor dentition.  Chest:  Clear bilaterally.  Heart:  Regular rate and rhythm with a well-healed  midline incision.  Abdomen has moderate obesity.  Musculoskeletal shows  no major deformities or cyanosis.  Neurologic:  No focal weakness or  paresthesias.  Skin without ulcers or rashes.  I do not palpate a  popliteal pulse on the right side.   I had a very long discussion with patient and his  wife present.  I have  recommend that we proceed with arteriography for further evaluation.  This has been scheduled as an outpatient on 02/23/10 with Dr. Fabienne Bruns.  We have tentatively scheduled surgery for a right femoral to  distal bypass with vein from the left leg on 03/02/10 pending on the  results of the study.  It has been some time since he has followed up  with his cardiologist, Dr. Allyson Sabal, and we have scheduled an evaluation  for preoperative cardiac clearance with Dr. Allyson Sabal.  Patient and his wife  know that he may have to have his surgical procedure delayed if we are  unable to obtain clearance prior to 03/02/10.  We will discuss further  recommendations and options pending his angio on 08/19.     Larina Earthly, M.D.  Electronically Signed   TFE/MEDQ  D:  02/20/2010  T:  02/20/2010  Job:  [id number]

## 2010-11-20 NOTE — Op Note (Signed)
NAME:  John Ball, John Ball NO.:  [id number]   MEDICAL RECORD NO.:  [id number]          PATIENT TYPE:  AMB   LOCATION:  SDS                          FACILITY:  [facility]   PHYSICIAN:  Balinda Quails, M.D.    DATE OF BIRTH:  1941-07-08   DATE OF PROCEDURE:  09/16/2008  DATE OF DISCHARGE:  09/16/2008                               OPERATIVE REPORT   PHYSICIAN:  Balinda Quails, MD   DIAGNOSIS:  Right lower extremity claudication.   PROCEDURES:  1. Abdominal aortogram with bilateral lower extremity runoff      arteriography.  2. Selective right lower extremity arteriogram.   ACCESS:  Left common femoral artery 5-French sheath.   CONTRAST:  133 mL of Visipaque.   COMPLICATIONS:  None apparent.   CLINICAL NOTE:  John Ball is a 70 year old male tobacco abuser with  severe right lower extremity claudication.  Ankle brachial index is 0.5  on the right.  Imaging reveals right superficial femoral artery disease.  Brought to the cath lab at this time for diagnostic workup with  arteriography and possible percutaneous intervention.   PROCEDURE NOTE:  The patient was brought to cath lab in stable  condition.  Placed in a supine position.  Both groins were prepped and  draped in sterile fashion.  Skin and subcutaneous tissue in the left  groin instilled 1% Xylocaine.  An 18-gauge needle induced into the left  common femoral artery.  A 0.035 Wholey guidewire was advanced through  the needle into the mid abdominal aorta.  A 5-French sheath was advanced  over the guidewire.  Pigtail catheter was advanced over the guidewire to  the mid abdominal aorta.   Standard AP mid abdominal aortogram obtained.  Single renal arteries  bilaterally without significant stenosis.  The infrarenal aorta was  widely patent.   The common iliac arteries bilaterally were lined with plaque, but no  significant stenosis.  The external iliac arteries were patent  bilaterally with evidence of moderate  plaque.  The hypogastric arteries  were intact bilaterally.   Left lower extremity revealed common femoral and profunda femoris artery  to be patent.  Mild-to-moderate disease of left superficial femoral  artery, which was patent.  Intact flow to the left popliteal artery.  Intact three-vessel tibial runoff of the left lower extremity.   Right lower extremity arteriography obtained, initially revealed right  common femoral and profunda femoris artery were widely patent.  The  right superficial femoral artery had a flush occlusion at the common  femoral artery.  Long segmental occlusion of the right superficial  femoral artery down to the adductor canal was reconstitution of the  popliteal artery with moderate disease.  Popliteal artery intact to the  tibioperoneal trunk.  The anterior tibial artery was occluded.  The  tibioperoneal trunk was patent, the origin of the posterior tibial and  peroneal arteries revealed subtotal occlusion.  There was reconstitution  of the posterior tibial and peroneal arteries in the proximal right calf  with flow to the foot.   These images were further obtained with exchange of the catheter.  The  pigtail catheter was brought down the aortic bifurcation, hooked on the  bifurcation, and a Wholey wire advanced into the right external iliac  artery.  The pigtail removed and exchanged for an end-hole catheter.  Peak hold images of the popliteal trifurcation were obtained to  delineate further anatomy of the right popliteal trifurcation.   This is completed the arteriogram procedure.  The catheter removed.  Sheath removed.  No apparent complications.   FINAL IMPRESSION:  1. Widely patent renal arteries bilaterally.  2. Mild disease of aortoiliac segment without significant stenosis.  3. Mild left lower extremity peripheral vascular disease without      vessel occlusion.  4. Right lower extremity reveals long segmental occlusion right      superficial  femoral artery, also tibial occlusive disease with      anterior tibial occlusion and subtotal occlusion of the origin of      the peroneal and posterior tibial arteries.   DISPOSITION:  The patient will follow up in the office for discussion of  further management of his current issue.      Balinda Quails, M.D.  Electronically Signed     PGH/MEDQ  D:  09/16/2008  T:  09/16/2008  Job:  [id number]

## 2010-11-20 NOTE — Procedures (Signed)
BYPASS GRAFT EVALUATION   INDICATION:  Followup evaluation of lower extremity bypass graft.   HISTORY:  Diabetes:  No.  Cardiac:  No.  Hypertension:  Yes.  Smoking:  Yes.  Previous Surgery:  Right fem-pop bypass graft on 10/24/2008 by Dr.  Madilyn Fireman.   SINGLE LEVEL ARTERIAL EXAM                               RIGHT              LEFT  Brachial:                    131                133  Anterior tibial:             85                 136  Posterior tibial:            89                 146  Peroneal:  Ankle/brachial index:        0.67               1.10   PREVIOUS ABI:  Date:  01/19/2009  RIGHT:  0.61  LEFT:  1.11   LOWER EXTREMITY BYPASS GRAFT DUPLEX EXAM:   DUPLEX:  Biphasic Doppler waveforms noted within graft, native artery  imaged.   IMPRESSION:  Patent right femoral-popliteal graft with no evidence of  stenosis.  Right lower extremity ankle brachial index 0.67 which suggests some mild  to moderate disease with monophasic waveforms.  Normal left ankle brachial index 1.10 with triphasic and monophasic  waveforms.   ___________________________________________  Di Kindle. Edilia Bo, M.D.   CB/MEDQ  D:  04/13/2009  T:  04/13/2009  Job:  [id number]

## 2010-11-20 NOTE — Discharge Summary (Signed)
NAME:  John Ball, John Ball NO.:  [id number]   MEDICAL RECORD NO.:  [id number]          PATIENT TYPE:  INP   LOCATION:  2039                         FACILITY:  [facility]   PHYSICIAN:  Balinda Quails, M.D.    DATE OF BIRTH:  08/13/1940   DATE OF ADMISSION:  10/24/2008  DATE OF DISCHARGE:  10/27/2008                               DISCHARGE SUMMARY   FINAL DISCHARGE DIAGNOSES:  1. Right lower extremity claudication.  2. Hypertension.  3. History of depression.  4. Dyslipidemia.  5. Tobacco abuse.   PROCEDURE PERFORMED:  Right femoropopliteal bypass utilizing 8-mm  stretch Gore-Tex graft.   COMPLICATIONS:  None.   CONDITION ON DISCHARGE:  Stable, improving.   DISCHARGE MEDICATIONS:  1. Altace 10 mg p.o. daily.  2. Baby aspirin 81 mg p.o. daily.  3. Cymbalta 60 mg p.o. daily.  4. Seroquel XR 300 mg p.o. nightly.  5. Ambien CR 12.5 mg p.o. nightly p.r.n. sleep.  6. TriCor 145 mg p.o. daily.  7. Tramadol 50 mg tablets to help right chest.  8. He is given prescriptions for Percocet 5/325 one p.o. q.4 h. p.r.n.      pain.  9. Lyrica 75 mg p.o. b.i.d.  10.A nicotine patch 14 mg transdermally daily and he is instructed not      to smoke while using the patch.   DISPOSITION:  He is being discharged home in stable condition after  receiving careful instructions regarding the care of his wounds and his  activity level.  He is to see Dr. Madilyn Fireman in 2 weeks with ABIs.  The  office will arrange the visit.   Brief identifying statement for complete details, please refer the typed  history and physical.  Briefly, this 70 year old gentleman was referred  to Dr. Madilyn Fireman with right lower extremity claudication.  Dr. Madilyn Fireman  evaluated him, found him to have peripheral vascular disease with  significant narrowings of his SFA.  Dr. Madilyn Fireman recommended right femoral  to popliteal bypass grafting.  He was informed of the risks and benefits  of the procedure and after careful consideration he  elected to proceed  with surgery.   HOSPITAL COURSE:  Preoperative workup was completed as an outpatient.  He was brought in through same-day surgery and underwent the  aforementioned revascularization.  For complete details, please refer to  typed operative report.  The procedure was without complications.  He  was returned to the Postanesthesia Care Unit extubated.  Following  stabilization, he was transferred to a bed on a surgical step-down unit.  He was observed overnight and  was able to be transferred to a bed on a surgical convalescent floor.  Following transfer, his diet and activity level was increased as  tolerated.  By the second postoperative day, he was walking  independently.  His wounds were healing well.  He was felt stable and  was discharged home in stable condition.      Wilmon Arms, PA      P. Liliane Bade, M.D.  Electronically Signed    KEL/MEDQ  D:  10/28/2008  T:  10/29/2008  Job:  [id number]

## 2010-11-20 NOTE — Assessment & Plan Note (Signed)
OFFICE VISIT   ESTER, MABE  DOB:  04-Aug-1940                                       09/29/2008  ZOXWR#:[id number]   The patient returned to the office today with his wife to discuss the  results of his arteriogram.  He is now complaining of some rest pain in  his right foot.  This is mainly in the forefoot.  He notes this to be at  night and at rest both.   On evaluation he has no palpable pulses in his right lower extremity  beyond the femoral.  He has cyanosis of the right forefoot.  Sensation  is intact.  There is no ulceration or gangrene present.   His arteriogram has revealed fairly extensive long segment occlusion of  the right superficial femoral artery, reconstitution of the popliteal  artery above the knee, anterior tibial occlusion and subtotal occlusion  of the tibial peroneal trunk.   I think he will come to a right femoral posterior tibial bypass for limb  salvage.  He has had saphenous vein harvested from his right leg for  coronary artery bypass.  Therefore he will require placement of left leg  saphenous vein for revascularization and limb salvage of right leg.  This is scheduled for 10/24/2008 at [facility].   Balinda Quails, M.D.  Electronically Signed   PGH/MEDQ  D:  09/29/2008  T:  09/30/2008  Job:  [id number]   cc:   Mechele Claude, MD

## 2010-11-20 NOTE — Op Note (Signed)
NAME:  John Ball, John Ball NO.:  [id number]   MEDICAL RECORD NO.:  [id number]          PATIENT TYPE:  INP   LOCATION:  3314                         FACILITY:  [facility]   PHYSICIAN:  Balinda Quails, M.D.    DATE OF BIRTH:  20-Sep-1940   DATE OF PROCEDURE:  10/24/2008  DATE OF DISCHARGE:                               OPERATIVE REPORT   SURGEON:  Balinda Quails, MD   ASSISTANT:  RNFA.   ANESTHETIC:  General endotracheal.   ANESTHESIOLOGIST:  Judie Petit, MD   PREOPERATIVE DIAGNOSIS:  Ischemic rest pain, right foot.   POSTOPERATIVE DIAGNOSIS:  Ischemic rest pain, right foot.   PROCEDURE:  Right femoral-popliteal bypass with 6-mm Gore-Tex graft.   CLINICAL NOTE:  John Ball is a 70 year old male with a history  of coronary artery disease, status post coronary artery bypass, and  heavy tobacco use.  He has a 6- to 8-week history of worsening  claudication and early ischemic rest pain in his right foot.  Workup for  this revealed right superficial femoral artery occlusion and moderate  tibial vessel disease.  He does have an above-knee popliteal artery,  which is patent and he will undergo right femoral-popliteal bypass with  Gore-Tex graft at this time due to history of harvesting of saphenous  vein from the right lower extremity for coronary artery bypass.   OPERATIVE PROCEDURE:  The patient was brought to the operating room in  stable condition.  Placed in supine position.  General endotracheal  anesthesia induced.  Foley catheter in place.  Both legs prepped and  draped in sterile fashion in a supine position.   Right groin skin incision made longitudinally.  Subcutaneous tissue  divided with electrocautery.  Deep dissection carried down through  lymphatics.  Right common femoral artery exposed.  The inguinal ligament  encircled proximally with the vessel loop.  Moderate posterior plaque  present.  Excellent pulse.  Distal dissection carried down to the  origin  of the profunda and superficial femoral arteries, which were freed and  encircled with vessel loops.   At the distal right thigh, a longitudinal skin incision made along the  sartorius muscle.  Dissection carried through the subcutaneous tissue.  Sartorius muscle reflected anteriorly.  The above-knee popliteal artery  just beyond the adductor canal was identified.  Moderate plaque present,  the vessel is patent.  A 6-mm vessel, the vessel is freed and encircled  with vessel loops.   A subsartorial tunnel made between the 2 incision and a 8-mm stretch  interring Gore-Tex graft was placed through the tunnel.  The patient  administered 7000 units of heparin intravenously.   The right femoral vessels controlled with clamps.  Longitudinal  arteriotomy made in the right common femoral artery.  The graft beveled  and anastomosed end-to-side to the right common femoral artery using  running 5-0 Prolene suture.  At completion of the right femoral  anastomosis, the graft was flushed and controlled with a fistula clamp.   The popliteal artery then controlled proximally and distally with  clamps.  Longitudinal arteriotomy made.  The Gore-Tex  graft stretched to  appropriate length, divided, beveled, and anastomosed end-to-side to the  above-knee popliteal artery using running 6-0 Prolene suture.  The graft  then flushed.  Clamps were removed.  Excellent flow present.  Adequate  hemostasis obtained.  Sponge and instrument counts correct.   Subcutaneous tissue closed on both incisions with running 2-0 Vicryl  suture in a deep layer, running 3-0 Vicryl suture in superficial layer.  A 4-0 Monocryl applied to skin along with Dermabond.  Sterile dressings  applied.   The patient tolerated the procedure well.  No apparent complications.  The patient transferred to recovery room in stable condition.      Balinda Quails, M.D.  Electronically Signed     PGH/MEDQ  D:  10/24/2008  T:   10/25/2008  Job:  [id number]   cc:   Joycelyn Man. Darlyn Read, MD

## 2010-11-20 NOTE — Consult Note (Signed)
VASCULAR SURGERY CONSULTATION   John Ball, John Ball  DOB:  08/01/1940                                       09/08/2008  ZOXWR#:[id number]   REFERRAL DIAGNOSIS:  Right lower extremity claudication.   HISTORY:  The patient is a 70 year old male with complaints of right  calf discomfort with ambulation.  He also notes numbness in his foot  with ambulation and a cold feeling in his toes.  His toes become  cyanotic at times.  He has noticed this for approximately 4-6 weeks.  He  does have pain in his foot at night.  This awakens him often during the  night.  Pain is worse with exertion.   He smokes 1 pack of cigarettes daily with a history of heavy tobacco use  in the past.  Risk factors for atherosclerotic vascular disease include  hypertension, coronary artery disease, tobacco use.   Lower extremity Dopplers reveal right ABI 0.50 left ABI 1.0.   PAST MEDICAL HISTORY:  1. Coronary artery disease status post coronary bypass.  2. Hypertension.  3. COPD.   MEDICATIONS:  1. Cymbalta 60 mg daily.  2. Seroquel XR 300 mg daily.  3. Ambien CR 12.5 mg daily.  4. TriCor 145 mg daily.  5. Ramipril 10 mg daily.  6. Tramadol p.r.n.   ALLERGIES:  Effexor, Crestor, Lipitor, Abilify, Zocor, aspirin,  Celebrex, Mobic.   FAMILY HISTORY:  Mother deceased age 61 of the complications of aortic  aneurysm.  Father deceased age 45 of Alzheimer's disease.   SOCIAL HISTORY:  The patient is married with 2 children.  Spent 20 years  in the [employer] and retired to Holiday representative work, now fully retired.  Smokes 1 pack of cigarettes daily.  No regular alcohol use.   REVIEW OF SYSTEMS:  Per the Patient Encounter Form.  The patient does  note chronic feelings of depression.   PHYSICAL EXAMINATION:  A moderately obese 70 year old male.  No acute  distress.  Alert and oriented.  Vital Signs:  BP is 120/76, pulse 77 per  minute, respirations 18 per minute.  HEENT:  Mouth and throat are  clear.  Normocephalic.  Neck:  Normal thyroid.  No adenopathy.  Cardiovascular:  No carotid bruits.  Normal heart sounds without murmurs.  Regular rate  and rhythm.  Chest:  Equal air entry bilaterally.  No rales or rhonchi.  Abdomen:  Soft, nontender.  Moderate obesity.  No organomegaly or  masses.  Normal bowel sounds.  Lower Extremities:  Femoral pulse 2+  bilaterally.  Left popliteal, posterior tibial and dorsalis pedis pulse  2+.  Absent right popliteal, posterior tibial and dorsalis pedis pulse.  Cyanosis of the right forefoot present.  No ulceration or gangrene.  Neurologic:  Cranial nerves intact.  Strength equal bilaterally.  Reflexes 1+.   IMPRESSION:  1. Right lower extremity claudication associated with reduced ankle      brachial index, probable right superficial femoral artery      occlusion.  2. Coronary artery disease.  3. Hypertension.  4. Chronic obstructive pulmonary disease.  5. Depression.   PLAN:  The patient will be scheduled to undergo right lower extremity  arteriogram with possible percutaneous intervention at [facility].  To begin aspirin 81 mg daily.   Balinda Quails, M.D.  Electronically Signed  PGH/MEDQ  D:  09/08/2008  Ball:  09/09/2008  Job:  [id number]   cc:   Mechele Claude, M.D.

## 2010-11-23 NOTE — Op Note (Signed)
NAME:  John Ball, John Ball NO.:  [id number]   MEDICAL RECORD NO.:  [id number]                   PATIENT TYPE:  INP   LOCATION:  2314                                 FACILITY:  [facility]   PHYSICIAN:  Salvatore Decent. Dorris Fetch, M.D.         DATE OF BIRTH:  [birth year]   DATE OF PROCEDURE:  07/27/2002  DATE OF DISCHARGE:                                 OPERATIVE REPORT   PREOPERATIVE DIAGNOSIS:  Left main and three vessel coronary artery disease.   POSTOPERATIVE DIAGNOSIS:  Left main and three vessel coronary artery  disease.   PROCEDURE:  Median sternotomy, extracorporeal circulation, coronary artery  bypass grafting times four ( left internal mammary artery to left anterior  descending, free right internal mammary artery  to ramus intermedius,  saphenous vein graft to obtuse marginal 2, saphenous vein graft to posterior  descending), endoscopic vein harvest right thigh.   SURGEON:  Salvatore Decent. Dorris Fetch, M.D.   ASSISTANT:  Kerin Perna, M.D.  Maple Mirza, P.A.   ANESTHESIA:  General.   FINDINGS:  1. Both mammary arteries were of good quality veins, small caliber but good     quality acceptable conduit.  OM1 and acute marginal too small to graft.     Posterior descending 1 mm poor quality target.  Remaining targets good     quality.  2. Emphysema bilaterally.   CLINICAL NOTE:  The patient is a 70 year old gentleman with  a long history  of  tobacco abuse.  He recently had a routine physical examination where he  was noted to have EKG changes compared to a previous study.  He had a stress  Cardiolite performed which was positive and then underwent cardiac  catheterization by Dr. Nanetta Batty that revealed a 70% ostial left main  stenosis and three vessel coronary artery disease.  The patient  was  referred for coronary artery bypass graft .  The indications, risks,  benefits, and alternative procedures were discussed in detail with the  patient. He understood and accepted the risks, and agreed to proceed.   OPERATIVE NOTE:  The patient was brought to the preop holding area on  07/27/2002.  Lines were placed to monitor arterial, central venous  And pulmonary arterial pressure.  EKG leads were placed for continuous  telemetry.  Intravenous antibiotics were  Administered.  The patient was taken to the operating room, anesthetized and  intubated.  A Foley catheter was placed and the chest, abdomen, and legs  were prepped and draped in the usual fashion. A median sternotomy was  performed.  Left internal mammary artery was harvested in the standard  fashion.  Simultaneously, incision was made in the medial aspect of the  right leg at the level of the knee.  The greater saphenous vein was  identified and harvested endoscopically from the right thigh.  5,000 units  of heparin was given prior to dividing the distal end of the left  mammary  artery.  There was excellent flow through the cut end of the vessel.  The  mammary was placed in papaverine soaked sponge and placed into pleural  space.  Next, the right internal mammary artery  was harvested in the same  fashion.  It was also a good quality conduit.  The remainder of the full  heparin dose was given prior to dividing the distal end of the right mammary  artery.  Again, there was excellent flow through this vessel as well.   The pericardium was opened. The ascending aorta was inspected and palpated.  There was no palpable atherosclerotic disease.  After confirming  anticoagulation with the ACT testing, the aorta was cannulated via  concentric 2-0 Ethibond pledgeted purse string sutures.  A dual staged  venous cannula was placed via a purse string suture  in the right atrial  appendage.  Cardiopulmonary bypass was instituted and the patient was cooled  to 32 degrees Celsius.  The coronary arteries were inspected and anastomotic  sites were chosen.  Of note, the only graftable  vessel in the distribution  of the right coronary artery was the posterior descending which was itself a  small diseased vessel.  The first obtuse marginal branch of the left  circumflex also was too small to graft, but OM 2a and the ramus intermedius  were both graftable target and the LAD was a good target.  The right mammary  was divided proximally.  The stump was suture ligated with a 2-0 silk.  A  foam pad was placed in the pericardium to protect the left phrenic nerve.  A  temperature probe was placed in the myocardial septum, and a cardioplegia  cannula was placed in the ascending aorta.   The aorta was cross clamped, the left ventricle was entered via the aortic  root.  Cardiac arrest then was achieved with a combination of cold antegrade  blood cardioplegia and topical iced saline after achieving a complete  diastolic arrest with a myocardial septal temperate of 11 degrees Celsius.  The following distal anastomoses were performed:   First, reverse saphenous vein graft was placed end-to-side to the posterior  descending branch of the right coronary artery.  As mentioned, this was the  only graftable target in the distal right distribution.  It was a 1 mm poor  quality vessel.  The anastomosis was performed with a running 7-0 Prolene  suture. The anastomosis was probed proximally and distally to ensure patency  prior to tying the suture.  Cardioplegia was administered. There was good  hemostasis at the anastomosis.   Next, a reverse saphenous vein graft was placed end-to-side to the second  obtuse marginal branch of the left circumflex coronary artery.  This vein  graft also was of good quality and relatively small caliber.  The OM was of  good quality at the site of the anastomosis.  There was significant disease  proximally.  The probe passed easily distally.  This vessel was a 1.5 mm  target.  The anastomosis was performed with a running 7-0 Prolene suture, and again was  probed proximally and distally to ensure patency.  There was  excellent flow through the graft, cardioplegia was administered and there  was good hemostasis.   Next, the distal end of the right internal mammary artery  was spatulated.  It was anastomosed end-to-side to the ramus intermedius.  This vessel was  compromised by the left main stenosis and  a  60% stenosis in  its mid  portion.  It was a 1.5 mm good quality target.  The anastomosis was  performed with a running 8-0 Prolene suture.  There was good flow through  the anastomosis.  Cardioplegia was administered via the vein grafts in the  aortic root. There was back bleeding from the right mammary.  There was good  hemostasis with the anastomosis.   Next, the left internal mammary artery was brought through the window in the  pericardium.  The distal end of it was spatulated, and it was anastomosed  end-to-side to the distal LAD.  The LAD was a 2 mm good quality target.  The  anastomosis was performed with a running 8-0 Prolene suture.  After  completion of the mammary to LAD anastomosis, bulldog clamp was briefly  removed from the mammary artery to inspect for hemostasis  Immediate mapping  of the wound was noted.  The bulldog clamp was replaced.  The mammary tacked  to the epicardial surface of the heart with 6-0 Prolene sutures.  Additional  cardioplegia was administered.  The vein grafts in the right mammary were  cut to length proximally.  The cardioplegia cannula was removed from the  ascending aorta, and a proximal vein graft and free right mammary  anastomoses were performed while under cross clamp with 4.0 punch  aortotomies.  6-0 running Prolene suture was used for the veins and a 7-0  running Prolene suture for the artery.  Having placement of the final vein  graft, the patient was placed in the Trendelenburg position, the aortic root  was deaired.  The aortic cross clamp was removed.  Total cross clamp time  was 83  minutes.  Vein grafts were deaired.  All proximal and distal  anastomoses were then inspected for hemostasis.  Epicardial pacing wires  were placed on the right ventricle and right atrium.  The patient did  require single defibrillation with 20 joules after removal of the cross  clamp and he remained in sinus rhythm thereafter.  When the patient had been  rewarmed to a core temperature of 37 degrees Celsius, he was weaned from  cardiopulmonary bypass without difficulty, and weaned from bypass in sinus  rhythm with no inotropic support.  Total bypass time was 144 minutes.  His  initial cardiac index was greater than 3 liters per minute per meter  squared, and the patient remained hemodynamically stable throughout the post  bypass period.   A test dose of protamine was administered and was well tolerated. The atrial  and aortic cannulae were removed.  The remainder of the protamine was  administered without incident.  The chest was irrigated with 1 liter of warm normal saline solution containing 1 gram of vancomycin.  Hemostasis was  achieved.  Bilateral pleural and two mediastinal chest tubes were placed  through separate subcostal incisions.  The pericardium was reapproximated  with interrupted 3-0 silk sutures and came together easily without tension.  The sternum was closed with interrupted heavy gauged single and double  stainless steel wires. The pectoralis fascia as closed with a running #1  Vicryl suture.  The subcutaneous tissues and skin in both the chest and the  leg was closed in standard fashion with subcuticular skin closure.  All  sponge, needle and instrument counts were correct at the end of the  procedure.  There were no intraoperative complications.  The patient was  taken from the operating room to the surgical Intensive Care Unit intubated,  in critical but  stable condition.                                               Salvatore Decent Dorris Fetch, M.D.    SCH/MEDQ  D:   07/27/2002  T:  07/28/2002  Job:  [id number]   cc:   Nanetta Batty, M.D.  [address]  [place]  [place] [address]  Fax: [phone]

## 2010-11-23 NOTE — Discharge Summary (Signed)
NAME:  John Ball, John Ball NO.:  [id number]   MEDICAL RECORD NO.:  [id number]                   PATIENT TYPE:  INP   LOCATION:  2040                                 FACILITY:  [facility]   PHYSICIAN:  Salvatore Decent. Dorris Fetch, M.D.         DATE OF BIRTH:  1941/06/24   DATE OF ADMISSION:  07/27/2002  DATE OF DISCHARGE:  07/31/2002                                 DISCHARGE SUMMARY   PHYSICIAN:  1. Nanetta Batty, M.D., cardiology.  2. Mechele Claude, M.D., primary care physician in [place].   FINAL DIAGNOSES:  1. Severe left main and three vessel coronary artery disease with preserved     left ventricular function.  2. Severe chronic obstructive pulmonary disease.  3. Prolonged postoperative oxygen dependence.  4. Elevated complete blood gases postoperatively.  5. Dyslipidemia.  6. Volume excess.  7. History of smoking.   PROCEDURE:  CABG x 4 on July 27, 2002 with the following grafts:  LIMA to  LAD, free right internal mammary artery to ramus intermedius, saphenous vein  graft to PDA, saphenous vein graft to second obtuse marginal.  Endoscopic  vein harvesting from the right thigh.   BRIEF HISTORY:  The patient is a 70 year old male with a long history of  tobacco abuse.  He had gone to his primary care physician for a flu shot and  was encouraged to get a physical exam since it had been a long time since he  had one.  An EKG showed abnormalities which prompted a Cardiolite study  which led to catheterization by Dr. Nanetta Batty.  This showed severe left  main and three vessel coronary artery disease with normal left ventricular  function.  EF was 60%.  He was referred to Dr. Viviann Spare C. Hendrickson who  recommended CABG.   HOSPITAL COURSE:  He underwent the procedure on July 27, 2002.  There  were no complications postoperatively.  He had a prolonged course secondary  to severe COPD and O2 dependence.  Otherwise, there were no major  complications.  He stayed in intensive care unit an extra day to work on his  pulmonary status.  He transferred to unit 2000 postoperative day two where  he continued working on his pulmonary status using the incentive spirometer  and flutter valve.   By postoperative day four, July 31, 2002, he was walking around, feeling  well.  He was 95% on room air.  He had diuresed well.  He was in sinus  rhythm at 78 beats per minute.  Blood pressure was 120/67.  He was afebrile.  Blood work was satisfactory.  Physical exam was satisfactory.  Wounds were  healing well.  He was discharged home in stable condition.   DISCHARGE MEDICATIONS:  1. He was told to resume his home Atenolol 25 mg p.o. b.i.d.  2. He was given Lasix 40 mg q.d.  3. KCL 20 mEq q.d.  4. Wellbutrin 150 mg q.d.  5. Altace 2.5 mg q.d.  6. Combivent inhaler 2 puffs q.i.d.  7. Zocor 40 mg q.d.  8. Enteric-coated aspirin q.d.  9. Ultram 50 mg 1-2 q.4-6h. p.r.n. pain.   ALLERGIES:  No drug allergies.   CONDITION ON DISCHARGE:  Stable.   SPECIAL INSTRUCTIONS:  He was told to avoid driving, working, heavy lifting,  strenuous activity.  He was told to use his incentive spirometer daily.  He  was told he could shower.  He was told to stop smoking.  He is to get a  chest x-ray at the [facility] one hour before he saw Dr.  Viviann Spare C. Hendrickson and to bring it with him to see Dr. Viviann Spare C.  Hendrickson.   FOLLOW UP:  1. Nanetta Batty, M.D. two weeks after discharge.  2. Salvatore Decent. Dorris Fetch, M.D. two weeks after discharge.  Office will call     with an appointment.     Lissa Merlin, P.A.                          Salvatore Decent Dorris Fetch, M.D.    Alwyn Ren  D:  08/18/2002  T:  08/18/2002  Job:  [id number]   cc:   Nanetta Batty, M.D.  [address], [address]  [place]  [place] [address]  Fax: [phone]   Mechele Claude, M.D.  [place], [place]

## 2010-11-23 NOTE — Cardiovascular Report (Signed)
NAME:  John Ball, John Ball NO.:  [id number]   MEDICAL RECORD NO.:  [id number]                   PATIENT TYPE:  OIB   LOCATION:  2854                                 FACILITY:  [facility]   PHYSICIAN:  Nanetta Batty, M.D.                DATE OF BIRTH:  1940/12/15   DATE OF PROCEDURE:  07/13/2002  DATE OF DISCHARGE:                              CARDIAC CATHETERIZATION   PROCEDURE PERFORMED:  Cardiac catheterization.   INDICATION:  The patient is a 70 year old white male, patient of Dr. Darlyn Read  with a history of tobacco abuse.  He is referred for Cardiolite stress  testing which revealed ischemia in the RCA territory.  He presents now for  diagnostic coronary arteriography.   DESCRIPTION OF PROCEDURE:  The patient was brought to the second floor [facility] in the postabsorptive state.  He was  premedicated with p.o. Valium.  His right groin was prepped and shaved in  the usual sterile fashion.  Then 1% Xylocaine was used for local anesthesia.  A 6 French sheath was inserted into the right femoral artery using the  standard Seldinger technique. A 6 French right and left Judkins diagnostic  catheter as well as a 6 French pigtail catheter were used for selective  coronary angiography, left ventriculography, subselective left internal  mammary artery angiography, and distal abdominal aortography. Omnipaque dye  was used for the entirety of the case.  Retrograde, aortic, left  ventricular, and pullback pressures were recorded.   HEMODYNAMICS:  1. Aortic systolic pressure 145, diastolic pressure 76.  2. Left ventricular systolic pressure 144, end-diastolic 22.   SELECTIVE CORONARY ANGIOGRAPHY:  1. Left main:  Left main had approximately a 60-70% ostial/proximal stenosis     with damping.  2. Left anterior descending:  The LAD was free of significant disease.  3. Left circumflex:  This vessel had a 50% segmental mid stenosis at  the     takeoff of the marginal branch.  4. Ramus intermedius branch:  This vessel had a 60-70% proximal to mid     stenosis.  5. Right coronary artery:  Dominant with 70% mid, 90% at the [facility], and 75%     at the crux with 95% long segmental PLA stenosis.   LEFT VENTRICULOGRAPHY:  The RAO left ventriculogram was performed using 25  cubic centimeter of Omnipaque dye at 12 cubic centimeter per second.  The  overall LV ejection fraction was estimated greater than 60% without focal  wall motion abnormalities.   Left internal mammary artery:  This vessel was subselectively visualized and  was widely patent.  It was suitable for use during coronary artery bypass  grafting.  There was incidentally noted 70% ostial left vertebral artery  stenosis.   DISTAL ABDOMINAL AORTOGRAPHY:  This was performed __________ using 20 cubic  centimeter of Omnipaque dye at 20 cubic centimeter per second x3.  There  was  a 30% proximal right renal artery stenosis, 40% proximal right common iliac  artery stenosis.   IMPRESSION:  The patient has left main three-vessel disease with preserved  left ventricular function and minimally positive function study.  Anatomically, he would benefit most from coronary artery bypass grafting to  decrease his cardiovascular mortality.   The sheaths were removed and pressure was held on the groin to achieve  hemostasis.  The patient left the lab in stable condition.  He will be  discharged home as an outpatient and we will see him back in the office in  two weeks.  Consultation will be obtained with CVTS.  Dr. Darlyn Read was  notified.                                                     Nanetta Batty, M.D.    Cordelia Pen  D:  07/13/2002  T:  07/14/2002  Job:  [id number]   cc:   Cardiac Catheterization Laboratory   Mechele Claude, M.D.  [employer]

## 2011-02-12 ENCOUNTER — Ambulatory Visit: Payer: Medicare Other | Admitting: Vascular Surgery

## 2011-02-14 ENCOUNTER — Encounter: Payer: Self-pay | Admitting: Vascular Surgery

## 2011-02-19 ENCOUNTER — Encounter (INDEPENDENT_AMBULATORY_CARE_PROVIDER_SITE_OTHER): Payer: Medicare Other

## 2011-02-19 ENCOUNTER — Ambulatory Visit (INDEPENDENT_AMBULATORY_CARE_PROVIDER_SITE_OTHER): Payer: Medicare Other | Admitting: Vascular Surgery

## 2011-02-19 ENCOUNTER — Encounter: Payer: Self-pay | Admitting: Vascular Surgery

## 2011-02-19 VITALS — BP 126/75 | HR 82 | Resp 20 | Ht 72.0 in | Wt 230.0 lb

## 2011-02-19 DIAGNOSIS — Z48812 Encounter for surgical aftercare following surgery on the circulatory system: Secondary | ICD-10-CM

## 2011-02-19 DIAGNOSIS — I739 Peripheral vascular disease, unspecified: Secondary | ICD-10-CM

## 2011-02-19 DIAGNOSIS — I70219 Atherosclerosis of native arteries of extremities with intermittent claudication, unspecified extremity: Secondary | ICD-10-CM

## 2011-02-19 NOTE — Progress Notes (Signed)
Subjective:     Patient ID: John Ball, male   DOB: [birth year], 70 y.o.   MRN: [id number]  HPI The patient presents today for followup of his extensive prior femoropopliteal bypasses on his right leg. He had initially had a right femoral to above-knee popliteal bypass in April 2010. This was followed by right above-knee to below-knee popliteal bypass in August of 2011. He presented with occlusion of this and underwent thrombectomy and revision of his femoral-popliteal bypass in April of 2012. He has no claudication symptoms. He has no rest pain. He does report some tingling sensations and staining in his right medial knee and this seems to be related to prior surgical incisions. Past history is otherwise unchanged.   Review of Systems: 10 point ROS negative except for ; Review of Systems  Musculoskeletal: Positive for myalgias.  c/o constant pain in right leg / groin to toes Past Medical History  Diagnosis Date  . Hypertension   . Hyperlipidemia   . COPD (chronic obstructive pulmonary disease)   . Depression   . Pain     legs and feet  . Coronary heart disease     History  Substance Use Topics  . Smoking status: Current Everyday Smoker -- 0.6 packs/day for 55 years    Types: Cigarettes  . Smokeless tobacco: Not on file  . Alcohol Use: No    Family History  Problem Relation Age of Onset  . Other Mother 55    Abdominal Aortic Aneurysm   . Alzheimer's disease Father 40    Allergies  Allergen Reactions  . Abilify   . Aspirin     Causes GI Bleed  . Crestor (Rosuvastatin Calcium)   . Effexor (Venlafaxine Hydrochloride)   . Lipitor (Atorvastatin Calcium)   . Zocor (Simvastatin - High Dose)     Current outpatient prescriptions:acetaminophen (TYLENOL) 500 MG tablet, Take 500 mg by mouth every 6 (six) hours as needed.  , Disp: , Rfl: ;  DULoxetine (CYMBALTA) 60 MG capsule, Take 60 mg by mouth daily.  , Disp: , Rfl: ;  QUEtiapine (SEROQUEL XR) 300 MG 24 hr tablet, Take 300 mg  by mouth at bedtime.  , Disp: , Rfl: ;  Ramipril (ALTACE PO), Take 10 mg by mouth daily.  , Disp: , Rfl:  zolpidem (AMBIEN CR) 12.5 MG CR tablet, Take 12.5 mg by mouth at bedtime.  , Disp: , Rfl:   Filed Vitals:   02/19/11 1216  Height: 6' (1.829 m)  Weight: 230 lb (104.327 kg)    Body mass index is 31.19 kg/(m^2).    Objective:   Physical Exam Well-developed well-nourished white male appearing his stated age. Operable right popliteal pulse. Absent pedal pulses. Palpable left dorsalis pedis pulse. Feet well perfused.  Lower extremity arterial Doppler study: Right ankle arm index 0.58 left 0.94. Duplex reveals occlusion of above-knee to below-knee popliteal bypass with patent femoral to above-knee popliteal bypass   Assessment:    asymptomatic occlusion of the lower segment of his femoral to popliteal bypass    Plan:        The patient is having no claudication symptoms or tissue loss therefore will be continued to follow in her noninvasive vascular lab. He'll notify us if he developed any difficulty.

## 2011-03-05 NOTE — Procedures (Unsigned)
BYPASS GRAFT EVALUATION  INDICATION:  Follow up peripheral vascular disease.  HISTORY: Diabetes:  No. Cardiac:  CABG. Hypertension:  Yes. Smoking:  Currently. Previous Surgery:  Right femoral-to-popliteal bypass graft on 10/24/2008; right above-knee to below-knee popliteal bypass graft via saphenous vein on 03/02/2010; right thrombectomy of femoral to popliteal bypass graft on 10/07/2010.  SINGLE LEVEL ARTERIAL EXAM                              RIGHT              LEFT Brachial:                    141                137 Anterior tibial:             77                 126 Posterior tibial:            82                 132 Peroneal: Ankle/brachial index:        0.58               0.94  PREVIOUS ABI:  Date: 10/08/2010 at [facility]  RIGHT:  0.66  LEFT: 1.08  LOWER EXTREMITY BYPASS GRAFT DUPLEX EXAM:  DUPLEX: 1. Elevated velocities in the right distal external iliac artery     suggestive of 30% to 49% stenosis. 2. Patent right femoral to popliteal bypass graft with residual     thrombus, nonoccluding, present at the distal anastomosis without     hemodynamic changes present. 3. Occluded right distal popliteal above-knee to below-knee bypass     graft and native mid popliteal artery. 4. Reconstitution of flow via collaterals in the distal popliteal     artery present.  IMPRESSION: 1. Patent right femoral to popliteal bypass graft, as noted above. 2. Occlusion of the right popliteal graft and native popliteal artery,     as noted above. 3. Right ankle brachial index of 0.58 and left ankle brachial index of     0.94.  Bilaterally, mild decrease since study at [facility] on     10/08/2010.  ___________________________________________ Larina Earthly, M.D.  SH/MEDQ  D:  02/20/2011  T:  02/20/2011  Job:  [id number]

## 2011-03-15 ENCOUNTER — Telehealth: Payer: Self-pay | Admitting: *Deleted

## 2011-03-15 NOTE — Telephone Encounter (Signed)
ATC Dr. Sharen Hones office and they were closed. Wcb on monday

## 2011-03-18 NOTE — Telephone Encounter (Signed)
I spoke with John Ball and she states pt has been scheduled with another facility around [facility]. Nothing further was needed

## 2011-03-19 ENCOUNTER — Other Ambulatory Visit (HOSPITAL_COMMUNITY): Payer: Self-pay | Admitting: *Deleted

## 2011-03-19 DIAGNOSIS — R911 Solitary pulmonary nodule: Secondary | ICD-10-CM

## 2011-03-22 ENCOUNTER — Encounter (HOSPITAL_COMMUNITY)
Admission: RE | Admit: 2011-03-22 | Discharge: 2011-03-22 | Disposition: A | Discharge status: Home / Self Care | Payer: Medicare Other | Source: Ambulatory Visit | Attending: Family Medicine | Admitting: Family Medicine

## 2011-03-22 ENCOUNTER — Encounter (HOSPITAL_COMMUNITY): Payer: Self-pay

## 2011-03-22 DIAGNOSIS — R911 Solitary pulmonary nodule: Secondary | ICD-10-CM

## 2011-03-22 DIAGNOSIS — J984 Other disorders of lung: Secondary | ICD-10-CM | POA: Insufficient documentation

## 2011-03-22 LAB — GLUCOSE, CAPILLARY: Glucose-Capillary: 108 mg/dL — ABNORMAL HIGH (ref 70–99)

## 2011-03-22 MED ORDER — FLUDEOXYGLUCOSE F - 18 (FDG) INJECTION
21.6000 | Freq: Once | INTRAVENOUS | Status: AC | PRN
  Administered 2011-03-22: 21.6 via INTRAVENOUS

## 2011-04-04 LAB — BASIC METABOLIC PANEL
BUN: 35 — ABNORMAL HIGH
GFR calc Af Amer: 53 — ABNORMAL LOW
GFR calc non Af Amer: 44 — ABNORMAL LOW
Potassium: 4.6
Sodium: 142

## 2011-04-04 LAB — CBC
HCT: 42.3
Platelets: 212
RBC: 4.57
WBC: 7.4

## 2011-04-04 LAB — URINE MICROSCOPIC-ADD ON

## 2011-04-04 LAB — URINALYSIS, ROUTINE W REFLEX MICROSCOPIC
Glucose, UA: NEGATIVE
Ketones, ur: NEGATIVE
Leukocytes, UA: NEGATIVE
pH: 5.5

## 2011-04-04 LAB — DIFFERENTIAL
Eosinophils Relative: 2
Lymphocytes Relative: 23
Lymphs Abs: 1.7
Monocytes Relative: 5

## 2011-05-05 DIAGNOSIS — C7A090 Malignant carcinoid tumor of the bronchus and lung: Secondary | ICD-10-CM | POA: Insufficient documentation

## 2011-05-05 DIAGNOSIS — C3490 Malignant neoplasm of unspecified part of unspecified bronchus or lung: Secondary | ICD-10-CM | POA: Insufficient documentation

## 2011-05-15 DIAGNOSIS — C3490 Malignant neoplasm of unspecified part of unspecified bronchus or lung: Secondary | ICD-10-CM | POA: Insufficient documentation

## 2011-05-27 ENCOUNTER — Encounter (INDEPENDENT_AMBULATORY_CARE_PROVIDER_SITE_OTHER): Payer: Medicare Other | Admitting: Vascular Surgery

## 2011-05-27 DIAGNOSIS — I739 Peripheral vascular disease, unspecified: Secondary | ICD-10-CM

## 2011-05-27 DIAGNOSIS — Z48812 Encounter for surgical aftercare following surgery on the circulatory system: Secondary | ICD-10-CM

## 2011-06-04 NOTE — Procedures (Unsigned)
BYPASS GRAFT EVALUATION  INDICATION:  Followup peripheral vascular disease  HISTORY: Diabetes:  No Cardiac:  No Hypertension:  Yes Smoking:  Currently Previous Surgery:  Right femoral to popliteal artery bypass graft on 10/24/2008; right above knee to below knee popliteal artery bypass graft with saphenous vein on 03/02/2010; right thrombectomy of femoral to popliteal artery bypass graft on 10/07/2010  SINGLE LEVEL ARTERIAL EXAM                              RIGHT              LEFT Brachial: Anterior tibial: Posterior tibial: Peroneal: Ankle/brachial index:        0.55               0.96  PREVIOUS ABI:  Date:  02/19/2011  RIGHT:  0.58  LEFT:  0.94  LOWER EXTREMITY BYPASS GRAFT DUPLEX EXAM:  DUPLEX:  Elevated velocities present involving the distal external iliac artery suggestive of a 50%-75% stenosis and a peak systolic velocity of 205 cm/s. Elevated velocity at the popliteal vein graft anastomosis which may suggest stenosis.  IMPRESSION: 1. Patent right femoral artery to popliteal artery bypass graft. 2. Patent right above knee to below knee popliteal vein graft. 3. Right ankle brachial index is stable and in the moderate     claudication range. 4. Essentially unchanged since previous study on 02/19/2011.  ___________________________________________ Larina Earthly, M.D.  SH/MEDQ  D:  05/27/2011  T:  05/27/2011  Job:  [id number]

## 2011-06-27 ENCOUNTER — Emergency Department (HOSPITAL_COMMUNITY): Payer: Medicare Other

## 2011-06-27 ENCOUNTER — Emergency Department (HOSPITAL_COMMUNITY)
Admission: EM | Admit: 2011-06-27 | Discharge: 2011-06-27 | Disposition: A | Discharge status: Home / Self Care | Payer: Medicare Other | Attending: Emergency Medicine | Admitting: Emergency Medicine

## 2011-06-27 ENCOUNTER — Encounter (HOSPITAL_COMMUNITY): Payer: Self-pay | Admitting: *Deleted

## 2011-06-27 DIAGNOSIS — R0602 Shortness of breath: Secondary | ICD-10-CM | POA: Insufficient documentation

## 2011-06-27 DIAGNOSIS — I2510 Atherosclerotic heart disease of native coronary artery without angina pectoris: Secondary | ICD-10-CM | POA: Insufficient documentation

## 2011-06-27 DIAGNOSIS — I10 Essential (primary) hypertension: Secondary | ICD-10-CM | POA: Insufficient documentation

## 2011-06-27 DIAGNOSIS — J449 Chronic obstructive pulmonary disease, unspecified: Secondary | ICD-10-CM | POA: Insufficient documentation

## 2011-06-27 DIAGNOSIS — R059 Cough, unspecified: Secondary | ICD-10-CM | POA: Insufficient documentation

## 2011-06-27 DIAGNOSIS — R05 Cough: Secondary | ICD-10-CM | POA: Insufficient documentation

## 2011-06-27 DIAGNOSIS — R062 Wheezing: Secondary | ICD-10-CM | POA: Insufficient documentation

## 2011-06-27 DIAGNOSIS — J4489 Other specified chronic obstructive pulmonary disease: Secondary | ICD-10-CM | POA: Insufficient documentation

## 2011-06-27 DIAGNOSIS — E785 Hyperlipidemia, unspecified: Secondary | ICD-10-CM | POA: Insufficient documentation

## 2011-06-27 LAB — COMPREHENSIVE METABOLIC PANEL
ALT: 36 U/L (ref 0–53)
Alkaline Phosphatase: 86 U/L (ref 39–117)
BUN: 28 mg/dL — ABNORMAL HIGH (ref 6–23)
CO2: 24 mEq/L (ref 19–32)
Chloride: 99 mEq/L (ref 96–112)
GFR calc Af Amer: >90 mL/min (ref >90)
GFR calc non Af Amer: 84 mL/min — ABNORMAL LOW (ref >90)
Glucose, Bld: 137 mg/dL — ABNORMAL HIGH (ref 70–99)
Potassium: 3.9 mEq/L (ref 3.5–5.1)
Sodium: 135 mEq/L (ref 135–145)
Total Bilirubin: 0.3 mg/dL (ref 0.3–1.2)

## 2011-06-27 LAB — CBC
Hemoglobin: 15.4 g/dL (ref 13.0–17.0)
MCH: 32.1 pg (ref 26.0–34.0)
Platelets: 179 10*3/uL (ref 150–400)
RBC: 4.8 MIL/uL (ref 4.22–5.81)
WBC: 11.2 10*3/uL — ABNORMAL HIGH (ref 4.0–10.5)

## 2011-06-27 LAB — DIFFERENTIAL
Lymphocytes Relative: 18 % (ref 12–46)
Lymphs Abs: 2 10*3/uL (ref 0.7–4.0)
Monocytes Relative: 5 % (ref 3–12)
Neutrophils Relative %: 76 % (ref 43–77)

## 2011-06-27 LAB — POCT I-STAT TROPONIN I: Troponin i, poc: 0 ng/mL (ref 0.00–0.08)

## 2011-06-27 MED ORDER — IPRATROPIUM BROMIDE 0.02 % IN SOLN
0.5000 mg | Freq: Once | RESPIRATORY_TRACT | Status: DC
  Filled 2011-06-27: qty 2.5

## 2011-06-27 MED ORDER — ALBUTEROL SULFATE (5 MG/ML) 0.5% IN NEBU
5.0000 mg | INHALATION_SOLUTION | Freq: Once | RESPIRATORY_TRACT | Status: DC
  Filled 2011-06-27: qty 1

## 2011-06-27 MED ORDER — METHYLPREDNISOLONE SODIUM SUCC 125 MG IJ SOLR: 125.0000 mg | Freq: Once | INTRAMUSCULAR | Status: DC

## 2011-06-27 NOTE — ED Notes (Signed)
Pt states that he is feeling fine, [place] at 2 L o2 sat 97%

## 2011-06-27 NOTE — ED Notes (Signed)
To ed for eval of ongoing sob. Pt had right lobectomy in oct. Post surgery pt has had pneumonia

## 2011-06-27 NOTE — ED Provider Notes (Signed)
History     CSN: [id number]  Arrival date & time 06/27/11  1220   First MD Initiated Contact with Patient 06/27/11 1237      Chief Complaint  Patient presents with  . Shortness of Breath    (Consider location/radiation/quality/duration/timing/severity/associated sxs/prior treatment) Patient is a 70 y.o. male presenting with shortness of breath. The history is provided by the patient. No language interpreter was used.  Shortness of Breath  The current episode started more than 2 weeks ago. The problem has been gradually worsening. The problem is moderate. The symptoms are relieved by one or more prescription drugs. Associated symptoms include cough, shortness of breath and wheezing. Pertinent negatives include no chest pain, no chest pressure, no orthopnea, no fever, no rhinorrhea, no sore throat and no stridor. His past medical history is significant for bronchiolitis and past wheezing. His past medical history does not include asthma, eczema or asthma in the family. He has been behaving normally. Urine output has been normal. The last void occurred 6 to 12 hours ago. There were no sick contacts. Recently, medical care has been given by the PCP. Services received include medications given.   Was sent to the hospital to be admitted per PCP in [facility].  He was instructed to go to [place] but he came here instead.  R upper lobectomy [facility] for lung cancer 10/12..  Continues to smoke. Her to be evaluated for pneumonia. Past Medical History  Diagnosis Date  . Hypertension   . Hyperlipidemia   . COPD (chronic obstructive pulmonary disease)   . Depression   . Pain     legs and feet  . Coronary heart disease   . Lung nodules     Past Surgical History  Procedure Date  . Abdominal aortogram 09/16/2008    bilateral lower extremity runoff arteriography  . Extremity arteriogram 09/16/2008    right lower  . Right femoral popliteral bypass 10/24/2008    6-mm Gore Tex Graft  . Thrombectomy      right femoral to above knee popliteral Gore-Tex graft  . Angioplasty     Dacron patch of Gore-Tex and saphenour vein junction and above knee popliteral artery  . Heart bypass     Family History  Problem Relation Age of Onset  . Other Mother 7    Abdominal Aortic Aneurysm   . Alzheimer's disease Father 75    History  Substance Use Topics  . Smoking status: Current Everyday Smoker -- 0.6 packs/day for 55 years    Types: Cigarettes  . Smokeless tobacco: Not on file  . Alcohol Use: No      Review of Systems  Constitutional: Negative for fever.  HENT: Negative for sore throat and rhinorrhea.   Respiratory: Positive for cough, shortness of breath and wheezing. Negative for stridor.   Cardiovascular: Negative for chest pain and orthopnea.  All other systems reviewed and are negative.    Allergies  Abilify; Aspirin; Crestor; Effexor; Lipitor; Morphine and related; and Zocor  Home Medications   Current Outpatient Rx  Name Route Sig Dispense Refill  . ACETAMINOPHEN 500 MG PO TABS Oral Take 500 mg by mouth every 6 (six) hours as needed.      . ARFORMOTEROL TARTRATE 15 MCG/2ML IN NEBU Nebulization Take 15 mcg by nebulization 2 (two) times daily.      . DULOXETINE HCL 60 MG PO CPEP Oral Take 60 mg by mouth daily.     . ORPHENADRINE CITRATE 100 MG PO TB12 Oral Take  100 mg by mouth 2 (two) times daily as needed. As needed to relax muscles.     Marland Kitchen PREDNISONE 10 MG PO TABS Oral Take 10 mg by mouth daily. Prednisone taper. 2 tablets three times daily for 3 days, 2 tablets twice daily for 3 days, 1 tablet three times daily for 3 days, 1 tablet twice daily for 3 days, 1 tablet daily for 3 days. Date filled was 06/17/11.     Marland Kitchen QUETIAPINE FUMARATE 300 MG PO TB24 Oral Take 300 mg by mouth at bedtime.     Marland Kitchen ALTACE PO Oral Take 10 mg by mouth daily.      Marland Kitchen ZOLPIDEM TARTRATE ER 12.5 MG PO TBCR Oral Take 12.5 mg by mouth at bedtime.        BP 117/74  Pulse 91  Temp(Src) 96.9 F (36.1 C)  (Oral)  Resp 20  SpO2 97%  Physical Exam  Nursing note and vitals reviewed. Constitutional: He is oriented to person, place, and time. He appears well-developed and well-nourished.  HENT:  Head: Normocephalic.  Eyes: Pupils are equal, round, and reactive to light.  Neck: Normal range of motion.  Cardiovascular: Normal rate, normal heart sounds and intact distal pulses.  Exam reveals no gallop and no friction rub.   No murmur heard. Pulmonary/Chest: No respiratory distress. He has decreased breath sounds in the right upper field and the right middle field. He has wheezes. He has no rales. He exhibits no tenderness.       SOB no more than usual per patient ( post lobectomy)  Abdominal: Soft.  Musculoskeletal: Normal range of motion. He exhibits no edema.  Neurological: He is alert and oriented to person, place, and time.  Skin: Skin is warm and dry. No rash noted. No erythema.  Psychiatric: He has a normal mood and affect.    ED Course  Procedures (including critical care time)  Labs Reviewed  CBC - Abnormal; Notable for the following:    WBC 11.2 (*)    All other components within normal limits  DIFFERENTIAL - Abnormal; Notable for the following:    Neutro Abs 8.5 (*)    All other components within normal limits  COMPREHENSIVE METABOLIC PANEL - Abnormal; Notable for the following:    Glucose, Bld 137 (*)    BUN 28 (*)    Albumin 3.4 (*)    GFR calc non Af Amer 84 (*)    All other components within normal limits  POCT I-STAT TROPONIN I  LAB REPORT - SCANNED   Dg Chest 2 View  06/27/2011  *RADIOLOGY REPORT*  Clinical Data: shortness of breath  CHEST - 2 VIEW  Comparison: 02/23/2010  Findings: The heart size appears normal.  There is postop change compatible with right lower lobectomy identified.  Changes include asymmetric elevation of the right hemidiaphragm and right lung volume loss.  The left lung appears clear.  IMPRESSION:  1.  Postoperative change in volume loss  involving the right lung. 2.  Left lung is clear.  Original Report Authenticated By: Rosealee Albee, M.D.     1. SOB (shortness of breath)       MDM   Date: 06/27/2011  Rate: 83  Rhythm: normal sinus rhythm  QRS Axis: normal  Intervals: normal  ST/T Wave abnormalities: normal  Conduction Disutrbances:none  Narrative Interpretation:   Old EKG Reviewed: unchanged  SOB was sent to hospital by PCP to r/o pneumonia.  Chest x-ray shows no pneumonia.  SOB no worse  than usual per patient.  RUlobectomy in October.  Continues to smoke.  Wants to go home.  Will follow up with PCP tomorrow or return here if worse.     Labs Reviewed  CBC - Abnormal; Notable for the following:    WBC 11.2 (*)    All other components within normal limits  DIFFERENTIAL - Abnormal; Notable for the following:    Neutro Abs 8.5 (*)    All other components within normal limits  COMPREHENSIVE METABOLIC PANEL - Abnormal; Notable for the following:    Glucose, Bld 137 (*)    BUN 28 (*)    Albumin 3.4 (*)    GFR calc non Af Amer 84 (*)    All other components within normal limits  POCT I-STAT TROPONIN I  LAB REPORT - SCANNED         Jethro Bastos, NP 06/28/11 2249

## 2011-06-27 NOTE — ED Notes (Signed)
Pt here for evaluation of continuing SOB , pt had right sided lobectomy in Oct and developed pneumonia afterwards, pt has had SOB since.  Pt O2 sat 99% on room air

## 2011-06-27 NOTE — ED Notes (Signed)
Family at bedside. 

## 2011-07-03 NOTE — ED Provider Notes (Signed)
Medical screening examination/treatment/procedure(s) were conducted as a shared visit with non-physician practitioner(s) and myself.  I personally evaluated the patient during the encounter.  Patient is talkative with minimal dyspnea.  Chest x-ray shows no acute pneumonia. Patient desires to go home. He is lucid  Donnetta Hutching, MD 07/03/11 [phone]

## 2011-07-15 DIAGNOSIS — I10 Essential (primary) hypertension: Secondary | ICD-10-CM | POA: Diagnosis not present

## 2011-07-15 DIAGNOSIS — J449 Chronic obstructive pulmonary disease, unspecified: Secondary | ICD-10-CM | POA: Diagnosis not present

## 2011-07-15 DIAGNOSIS — C3410 Malignant neoplasm of upper lobe, unspecified bronchus or lung: Secondary | ICD-10-CM | POA: Diagnosis not present

## 2011-07-15 DIAGNOSIS — F411 Generalized anxiety disorder: Secondary | ICD-10-CM | POA: Diagnosis not present

## 2011-07-23 DIAGNOSIS — J811 Chronic pulmonary edema: Secondary | ICD-10-CM | POA: Diagnosis not present

## 2011-07-23 DIAGNOSIS — Z902 Acquired absence of lung [part of]: Secondary | ICD-10-CM | POA: Diagnosis not present

## 2011-07-23 DIAGNOSIS — Z85828 Personal history of other malignant neoplasm of skin: Secondary | ICD-10-CM | POA: Diagnosis not present

## 2011-07-23 DIAGNOSIS — Z85118 Personal history of other malignant neoplasm of bronchus and lung: Secondary | ICD-10-CM | POA: Diagnosis not present

## 2011-07-23 DIAGNOSIS — G894 Chronic pain syndrome: Secondary | ICD-10-CM | POA: Diagnosis present

## 2011-07-23 DIAGNOSIS — C3430 Malignant neoplasm of lower lobe, unspecified bronchus or lung: Secondary | ICD-10-CM | POA: Diagnosis not present

## 2011-07-23 DIAGNOSIS — E785 Hyperlipidemia, unspecified: Secondary | ICD-10-CM | POA: Diagnosis present

## 2011-07-23 DIAGNOSIS — F411 Generalized anxiety disorder: Secondary | ICD-10-CM | POA: Diagnosis present

## 2011-07-23 DIAGNOSIS — J449 Chronic obstructive pulmonary disease, unspecified: Secondary | ICD-10-CM | POA: Diagnosis not present

## 2011-07-23 DIAGNOSIS — Z951 Presence of aortocoronary bypass graft: Secondary | ICD-10-CM | POA: Diagnosis not present

## 2011-07-23 DIAGNOSIS — I10 Essential (primary) hypertension: Secondary | ICD-10-CM | POA: Diagnosis present

## 2011-07-23 DIAGNOSIS — J90 Pleural effusion, not elsewhere classified: Secondary | ICD-10-CM | POA: Diagnosis not present

## 2011-07-23 DIAGNOSIS — R222 Localized swelling, mass and lump, trunk: Secondary | ICD-10-CM | POA: Diagnosis present

## 2011-07-23 DIAGNOSIS — I70209 Unspecified atherosclerosis of native arteries of extremities, unspecified extremity: Secondary | ICD-10-CM | POA: Diagnosis present

## 2011-07-23 DIAGNOSIS — Z86718 Personal history of other venous thrombosis and embolism: Secondary | ICD-10-CM | POA: Diagnosis not present

## 2011-07-23 DIAGNOSIS — C3490 Malignant neoplasm of unspecified part of unspecified bronchus or lung: Secondary | ICD-10-CM | POA: Diagnosis not present

## 2011-07-23 DIAGNOSIS — F329 Major depressive disorder, single episode, unspecified: Secondary | ICD-10-CM | POA: Diagnosis present

## 2011-07-23 DIAGNOSIS — Z9981 Dependence on supplemental oxygen: Secondary | ICD-10-CM | POA: Diagnosis not present

## 2011-07-23 DIAGNOSIS — J9600 Acute respiratory failure, unspecified whether with hypoxia or hypercapnia: Secondary | ICD-10-CM | POA: Diagnosis not present

## 2011-07-23 DIAGNOSIS — R599 Enlarged lymph nodes, unspecified: Secondary | ICD-10-CM | POA: Diagnosis present

## 2011-07-23 DIAGNOSIS — J4489 Other specified chronic obstructive pulmonary disease: Secondary | ICD-10-CM | POA: Diagnosis not present

## 2011-07-23 DIAGNOSIS — J441 Chronic obstructive pulmonary disease with (acute) exacerbation: Secondary | ICD-10-CM | POA: Diagnosis present

## 2011-07-23 DIAGNOSIS — F17200 Nicotine dependence, unspecified, uncomplicated: Secondary | ICD-10-CM | POA: Diagnosis not present

## 2011-07-23 DIAGNOSIS — I2510 Atherosclerotic heart disease of native coronary artery without angina pectoris: Secondary | ICD-10-CM | POA: Diagnosis present

## 2011-07-23 DIAGNOSIS — J189 Pneumonia, unspecified organism: Secondary | ICD-10-CM | POA: Diagnosis not present

## 2011-08-06 DIAGNOSIS — J438 Other emphysema: Secondary | ICD-10-CM | POA: Diagnosis not present

## 2011-08-06 DIAGNOSIS — C3430 Malignant neoplasm of lower lobe, unspecified bronchus or lung: Secondary | ICD-10-CM | POA: Diagnosis not present

## 2011-08-06 DIAGNOSIS — F17200 Nicotine dependence, unspecified, uncomplicated: Secondary | ICD-10-CM | POA: Diagnosis not present

## 2011-08-12 DIAGNOSIS — R0602 Shortness of breath: Secondary | ICD-10-CM | POA: Diagnosis not present

## 2011-08-13 DIAGNOSIS — C3430 Malignant neoplasm of lower lobe, unspecified bronchus or lung: Secondary | ICD-10-CM | POA: Diagnosis not present

## 2011-08-21 DIAGNOSIS — C3430 Malignant neoplasm of lower lobe, unspecified bronchus or lung: Secondary | ICD-10-CM | POA: Diagnosis not present

## 2011-08-30 DIAGNOSIS — C3430 Malignant neoplasm of lower lobe, unspecified bronchus or lung: Secondary | ICD-10-CM | POA: Diagnosis not present

## 2011-10-10 DIAGNOSIS — I10 Essential (primary) hypertension: Secondary | ICD-10-CM | POA: Diagnosis not present

## 2011-10-10 DIAGNOSIS — Z139 Encounter for screening, unspecified: Secondary | ICD-10-CM | POA: Diagnosis not present

## 2011-10-10 DIAGNOSIS — Z1212 Encounter for screening for malignant neoplasm of rectum: Secondary | ICD-10-CM | POA: Diagnosis not present

## 2011-10-10 DIAGNOSIS — E7800 Pure hypercholesterolemia, unspecified: Secondary | ICD-10-CM | POA: Diagnosis not present

## 2011-10-10 DIAGNOSIS — Z87891 Personal history of nicotine dependence: Secondary | ICD-10-CM | POA: Diagnosis not present

## 2011-10-10 DIAGNOSIS — Z6829 Body mass index (BMI) 29.0-29.9, adult: Secondary | ICD-10-CM | POA: Diagnosis not present

## 2011-11-11 DIAGNOSIS — D485 Neoplasm of uncertain behavior of skin: Secondary | ICD-10-CM | POA: Diagnosis not present

## 2011-11-11 DIAGNOSIS — Z85828 Personal history of other malignant neoplasm of skin: Secondary | ICD-10-CM | POA: Diagnosis not present

## 2011-11-11 DIAGNOSIS — D235 Other benign neoplasm of skin of trunk: Secondary | ICD-10-CM | POA: Diagnosis not present

## 2011-11-11 DIAGNOSIS — L570 Actinic keratosis: Secondary | ICD-10-CM | POA: Diagnosis not present

## 2011-11-12 DIAGNOSIS — L988 Other specified disorders of the skin and subcutaneous tissue: Secondary | ICD-10-CM | POA: Diagnosis not present

## 2011-11-12 DIAGNOSIS — C3490 Malignant neoplasm of unspecified part of unspecified bronchus or lung: Secondary | ICD-10-CM | POA: Diagnosis not present

## 2011-11-12 DIAGNOSIS — R5381 Other malaise: Secondary | ICD-10-CM | POA: Diagnosis not present

## 2011-11-12 DIAGNOSIS — C3430 Malignant neoplasm of lower lobe, unspecified bronchus or lung: Secondary | ICD-10-CM | POA: Diagnosis not present

## 2011-11-12 DIAGNOSIS — L570 Actinic keratosis: Secondary | ICD-10-CM | POA: Diagnosis not present

## 2011-11-12 DIAGNOSIS — R5383 Other fatigue: Secondary | ICD-10-CM | POA: Diagnosis not present

## 2011-11-12 DIAGNOSIS — L820 Inflamed seborrheic keratosis: Secondary | ICD-10-CM | POA: Diagnosis not present

## 2011-11-12 DIAGNOSIS — C4491 Basal cell carcinoma of skin, unspecified: Secondary | ICD-10-CM | POA: Diagnosis not present

## 2011-11-13 DIAGNOSIS — J438 Other emphysema: Secondary | ICD-10-CM | POA: Diagnosis not present

## 2011-11-13 DIAGNOSIS — F17200 Nicotine dependence, unspecified, uncomplicated: Secondary | ICD-10-CM | POA: Diagnosis not present

## 2011-11-13 DIAGNOSIS — C3430 Malignant neoplasm of lower lobe, unspecified bronchus or lung: Secondary | ICD-10-CM | POA: Diagnosis not present

## 2011-11-13 DIAGNOSIS — R069 Unspecified abnormalities of breathing: Secondary | ICD-10-CM | POA: Diagnosis not present

## 2011-11-13 DIAGNOSIS — J90 Pleural effusion, not elsewhere classified: Secondary | ICD-10-CM | POA: Diagnosis not present

## 2011-11-15 DIAGNOSIS — L570 Actinic keratosis: Secondary | ICD-10-CM | POA: Diagnosis not present

## 2011-11-15 DIAGNOSIS — L905 Scar conditions and fibrosis of skin: Secondary | ICD-10-CM | POA: Diagnosis not present

## 2011-11-15 DIAGNOSIS — C44621 Squamous cell carcinoma of skin of unspecified upper limb, including shoulder: Secondary | ICD-10-CM | POA: Diagnosis not present

## 2011-12-17 DIAGNOSIS — C3410 Malignant neoplasm of upper lobe, unspecified bronchus or lung: Secondary | ICD-10-CM | POA: Diagnosis not present

## 2011-12-17 DIAGNOSIS — M542 Cervicalgia: Secondary | ICD-10-CM | POA: Diagnosis not present

## 2011-12-17 DIAGNOSIS — M545 Low back pain: Secondary | ICD-10-CM | POA: Diagnosis not present

## 2012-01-07 DIAGNOSIS — H109 Unspecified conjunctivitis: Secondary | ICD-10-CM | POA: Diagnosis not present

## 2012-02-04 DIAGNOSIS — C3430 Malignant neoplasm of lower lobe, unspecified bronchus or lung: Secondary | ICD-10-CM | POA: Diagnosis not present

## 2012-02-06 DIAGNOSIS — C3490 Malignant neoplasm of unspecified part of unspecified bronchus or lung: Secondary | ICD-10-CM | POA: Diagnosis not present

## 2012-02-06 DIAGNOSIS — C3430 Malignant neoplasm of lower lobe, unspecified bronchus or lung: Secondary | ICD-10-CM | POA: Diagnosis not present

## 2012-02-06 DIAGNOSIS — C4491 Basal cell carcinoma of skin, unspecified: Secondary | ICD-10-CM | POA: Diagnosis not present

## 2012-02-11 DIAGNOSIS — C3430 Malignant neoplasm of lower lobe, unspecified bronchus or lung: Secondary | ICD-10-CM | POA: Diagnosis not present

## 2012-02-12 ENCOUNTER — Encounter: Payer: Self-pay | Admitting: Vascular Surgery

## 2012-02-20 DIAGNOSIS — T1510XA Foreign body in conjunctival sac, unspecified eye, initial encounter: Secondary | ICD-10-CM | POA: Diagnosis not present

## 2012-04-20 DIAGNOSIS — H16049 Marginal corneal ulcer, unspecified eye: Secondary | ICD-10-CM | POA: Diagnosis not present

## 2012-04-20 DIAGNOSIS — H11049 Peripheral pterygium, stationary, unspecified eye: Secondary | ICD-10-CM | POA: Diagnosis not present

## 2012-04-24 DIAGNOSIS — H16049 Marginal corneal ulcer, unspecified eye: Secondary | ICD-10-CM | POA: Diagnosis not present

## 2012-04-24 DIAGNOSIS — H11049 Peripheral pterygium, stationary, unspecified eye: Secondary | ICD-10-CM | POA: Diagnosis not present

## 2012-05-01 DIAGNOSIS — T1510XA Foreign body in conjunctival sac, unspecified eye, initial encounter: Secondary | ICD-10-CM | POA: Diagnosis not present

## 2012-05-04 DIAGNOSIS — Z23 Encounter for immunization: Secondary | ICD-10-CM | POA: Diagnosis not present

## 2012-05-04 DIAGNOSIS — Z01818 Encounter for other preprocedural examination: Secondary | ICD-10-CM | POA: Diagnosis not present

## 2012-05-05 DIAGNOSIS — R5383 Other fatigue: Secondary | ICD-10-CM | POA: Diagnosis not present

## 2012-05-05 DIAGNOSIS — C3430 Malignant neoplasm of lower lobe, unspecified bronchus or lung: Secondary | ICD-10-CM | POA: Diagnosis not present

## 2012-05-05 DIAGNOSIS — C4491 Basal cell carcinoma of skin, unspecified: Secondary | ICD-10-CM | POA: Diagnosis not present

## 2012-05-05 DIAGNOSIS — R5381 Other malaise: Secondary | ICD-10-CM | POA: Diagnosis not present

## 2012-05-05 DIAGNOSIS — C3490 Malignant neoplasm of unspecified part of unspecified bronchus or lung: Secondary | ICD-10-CM | POA: Diagnosis not present

## 2012-05-08 HISTORY — PX: EYE SURGERY: SHX253

## 2012-05-12 DIAGNOSIS — T1510XA Foreign body in conjunctival sac, unspecified eye, initial encounter: Secondary | ICD-10-CM | POA: Diagnosis not present

## 2012-05-28 DIAGNOSIS — Z125 Encounter for screening for malignant neoplasm of prostate: Secondary | ICD-10-CM | POA: Diagnosis not present

## 2012-05-28 DIAGNOSIS — E7800 Pure hypercholesterolemia, unspecified: Secondary | ICD-10-CM | POA: Diagnosis not present

## 2012-06-10 DIAGNOSIS — R0602 Shortness of breath: Secondary | ICD-10-CM | POA: Diagnosis not present

## 2012-06-10 DIAGNOSIS — I10 Essential (primary) hypertension: Secondary | ICD-10-CM | POA: Diagnosis not present

## 2012-08-04 DIAGNOSIS — C3430 Malignant neoplasm of lower lobe, unspecified bronchus or lung: Secondary | ICD-10-CM | POA: Diagnosis not present

## 2012-08-07 ENCOUNTER — Other Ambulatory Visit: Payer: Self-pay | Admitting: *Deleted

## 2012-08-07 DIAGNOSIS — I739 Peripheral vascular disease, unspecified: Secondary | ICD-10-CM

## 2012-08-07 DIAGNOSIS — Z48812 Encounter for surgical aftercare following surgery on the circulatory system: Secondary | ICD-10-CM

## 2012-08-11 ENCOUNTER — Encounter: Payer: Self-pay | Admitting: Neurosurgery

## 2012-08-12 ENCOUNTER — Encounter (INDEPENDENT_AMBULATORY_CARE_PROVIDER_SITE_OTHER): Payer: Medicare Other | Admitting: *Deleted

## 2012-08-12 ENCOUNTER — Encounter: Payer: Self-pay | Admitting: Neurosurgery

## 2012-08-12 ENCOUNTER — Ambulatory Visit (INDEPENDENT_AMBULATORY_CARE_PROVIDER_SITE_OTHER): Payer: Medicare Other | Admitting: Neurosurgery

## 2012-08-12 VITALS — BP 125/78 | HR 74 | Resp 16 | Ht 72.0 in | Wt 224.0 lb

## 2012-08-12 DIAGNOSIS — Z48812 Encounter for surgical aftercare following surgery on the circulatory system: Secondary | ICD-10-CM | POA: Diagnosis not present

## 2012-08-12 DIAGNOSIS — I739 Peripheral vascular disease, unspecified: Secondary | ICD-10-CM | POA: Diagnosis not present

## 2012-08-12 NOTE — Progress Notes (Signed)
[facility] [place] PAD/PVD Office Note  CC: PAD surveillance Referring Physician: Early  History of Present Illness: 72 year old male patient of Dr. Arbie Cookey status post extensive prior femoropopliteal bypasses on his right leg. He had initially had a right femoral to above-knee popliteal bypass in April 2010. This was followed by right above-knee to below-knee popliteal bypass in August of 2011. He presented with occlusion of this and underwent thrombectomy and revision of his femoral-popliteal bypass in April of 2012. The patient denies claudication or rest pain. The patient states he can walk any distance without any difficulty other than just being "tired" .   Past Medical History  Diagnosis Date  . Hypertension   . Hyperlipidemia   . COPD (chronic obstructive pulmonary disease)   . Depression   . Pain     legs and feet  . Coronary heart disease   . Lung nodules     ROS: [x]  Positive   [ ]  Denies    General: [ ]  Weight loss, [ ]  Fever, [ ]  chills Neurologic: [ ]  Dizziness, [ ]  Blackouts, [ ]  Seizure [ ]  Stroke, [ ]  "Mini stroke", [ ]  Slurred speech, [ ]  Temporary blindness; [ ]  weakness in arms or legs, [ ]  Hoarseness Cardiac: [ ]  Chest pain/pressure, [ ]  Shortness of breath at rest [ ]  Shortness of breath with exertion, [ ]  Atrial fibrillation or irregular heartbeat Vascular: [ ]  Pain in legs with walking, [ ]  Pain in legs at rest, [ ]  Pain in legs at night,  [ ]  Non-healing ulcer, [ ]  Blood clot in vein/DVT,   Pulmonary: [ ]  Home oxygen, [ ]  Productive cough, [ ]  Coughing up blood, [ ]  Asthma,  [ ]  Wheezing Musculoskeletal:  [ ]  Arthritis, [ ]  Low back pain, [ ]  Joint pain Hematologic: [ ]  Easy Bruising, [ ]  Anemia; [ ]  Hepatitis Gastrointestinal: [ ]  Blood in stool, [ ]  Gastroesophageal Reflux/heartburn, [ ]  Trouble swallowing Urinary: [ ]  chronic Kidney disease, [ ]  on HD - [ ]  MWF or [ ]  TTHS, [ ]  Burning with urination, [ ]  Difficulty urinating Skin: [  ] Rashes, [ ]  Wounds Psychological: [ ]  Anxiety, [ ]  Depression   Social History History  Substance Use Topics  . Smoking status: Current Every Day Smoker -- 0.6 packs/day for 55 years    Types: Cigarettes  . Smokeless tobacco: Not on file  . Alcohol Use: No    Family History Family History  Problem Relation Age of Onset  . Other Mother 65    Abdominal Aortic Aneurysm   . Alzheimer's disease Father 67    Allergies  Allergen Reactions  . Aripiprazole     "makes him wild"  . Aspirin     Causes GI Bleed  . Crestor (Rosuvastatin Calcium) Itching  . Effexor (Venlafaxine Hydrochloride) Itching  . Lipitor (Atorvastatin Calcium) Itching  . Morphine And Related Itching  . Zocor (Simvastatin - High Dose) Itching    Current Outpatient Prescriptions  Medication Sig Dispense Refill  . arformoterol (BROVANA) 15 MCG/2ML NEBU Take 15 mcg by nebulization 2 (two) times daily.        . DULoxetine (CYMBALTA) 60 MG capsule Take 60 mg by mouth daily.       . QUEtiapine (SEROQUEL XR) 300 MG 24 hr tablet Take 300 mg by mouth at bedtime.       . Ramipril (ALTACE PO) Take 10 mg by mouth daily.        Marland Kitchen  zolpidem (AMBIEN CR) 12.5 MG CR tablet Take 12.5 mg by mouth at bedtime.        Marland Kitchen acetaminophen (TYLENOL) 500 MG tablet Take 500 mg by mouth every 6 (six) hours as needed.        . orphenadrine (NORFLEX) 100 MG tablet Take 100 mg by mouth 2 (two) times daily as needed. As needed to relax muscles.       . predniSONE (DELTASONE) 10 MG tablet Take 10 mg by mouth daily. Prednisone taper. 2 tablets three times daily for 3 days, 2 tablets twice daily for 3 days, 1 tablet three times daily for 3 days, 1 tablet twice daily for 3 days, 1 tablet daily for 3 days. Date filled was 06/17/11.         Physical Examination  Filed Vitals:   08/12/12 1612  BP: 125/78  Pulse: 74  Resp: 16    Body mass index is 30.38 kg/(m^2).  General:  WDWN in NAD Gait: Normal HEENT: WNL Eyes: Pupils equal Pulmonary:  normal non-labored breathing , without Rales, rhonchi,  wheezing Cardiac: RRR, without  Murmurs, rubs or gallops; No carotid bruits Abdomen: soft, NT, no masses Skin: no rashes, ulcers noted Vascular Exam/Pulses: Palpable femoral pulses, palpable but dampened lower extremity pulses bilaterally  Extremities without ischemic changes, no Gangrene , no cellulitis; no open wounds;  Musculoskeletal: no muscle wasting or atrophy  Neurologic: A&O X 3; Appropriate Affect ; SENSATION: normal; MOTOR FUNCTION:  moving all extremities equally. Speech is fluent/normal  Non-Invasive Vascular Imaging: ABIs today were 0.73 and biphasic on the right, 0.99 on the left which is improvement from previous exam November 2012  ASSESSMENT/PLAN: PAD patient without complaints of claudication or rest pain. The patient will followup in one year with repeat graft duplex and ABIs. The patient's questions were encouraged and answered, he is in agreement with this plan.  Lauree Chandler ANP  Clinic M.D.: Edilia Bo

## 2012-08-14 DIAGNOSIS — I10 Essential (primary) hypertension: Secondary | ICD-10-CM | POA: Diagnosis not present

## 2012-08-14 DIAGNOSIS — G4730 Sleep apnea, unspecified: Secondary | ICD-10-CM | POA: Diagnosis not present

## 2012-08-14 DIAGNOSIS — C3410 Malignant neoplasm of upper lobe, unspecified bronchus or lung: Secondary | ICD-10-CM | POA: Diagnosis not present

## 2012-08-14 DIAGNOSIS — J449 Chronic obstructive pulmonary disease, unspecified: Secondary | ICD-10-CM | POA: Diagnosis not present

## 2012-08-14 DIAGNOSIS — F411 Generalized anxiety disorder: Secondary | ICD-10-CM | POA: Diagnosis not present

## 2012-08-24 DIAGNOSIS — R5381 Other malaise: Secondary | ICD-10-CM | POA: Diagnosis not present

## 2012-08-24 DIAGNOSIS — R5383 Other fatigue: Secondary | ICD-10-CM | POA: Diagnosis not present

## 2012-08-24 DIAGNOSIS — C3490 Malignant neoplasm of unspecified part of unspecified bronchus or lung: Secondary | ICD-10-CM | POA: Diagnosis not present

## 2012-08-24 DIAGNOSIS — C4491 Basal cell carcinoma of skin, unspecified: Secondary | ICD-10-CM | POA: Diagnosis not present

## 2012-08-24 DIAGNOSIS — C3430 Malignant neoplasm of lower lobe, unspecified bronchus or lung: Secondary | ICD-10-CM | POA: Diagnosis not present

## 2012-10-01 DIAGNOSIS — J449 Chronic obstructive pulmonary disease, unspecified: Secondary | ICD-10-CM | POA: Diagnosis not present

## 2012-10-01 DIAGNOSIS — I10 Essential (primary) hypertension: Secondary | ICD-10-CM | POA: Diagnosis not present

## 2012-11-03 DIAGNOSIS — C3430 Malignant neoplasm of lower lobe, unspecified bronchus or lung: Secondary | ICD-10-CM | POA: Diagnosis not present

## 2012-11-03 DIAGNOSIS — C3490 Malignant neoplasm of unspecified part of unspecified bronchus or lung: Secondary | ICD-10-CM | POA: Diagnosis not present

## 2013-01-11 DIAGNOSIS — C3430 Malignant neoplasm of lower lobe, unspecified bronchus or lung: Secondary | ICD-10-CM | POA: Diagnosis not present

## 2013-01-11 DIAGNOSIS — E785 Hyperlipidemia, unspecified: Secondary | ICD-10-CM | POA: Diagnosis not present

## 2013-01-15 DIAGNOSIS — C3490 Malignant neoplasm of unspecified part of unspecified bronchus or lung: Secondary | ICD-10-CM | POA: Diagnosis not present

## 2013-01-15 DIAGNOSIS — C4491 Basal cell carcinoma of skin, unspecified: Secondary | ICD-10-CM | POA: Diagnosis not present

## 2013-01-15 DIAGNOSIS — R5381 Other malaise: Secondary | ICD-10-CM | POA: Diagnosis not present

## 2013-01-15 DIAGNOSIS — C3430 Malignant neoplasm of lower lobe, unspecified bronchus or lung: Secondary | ICD-10-CM | POA: Diagnosis not present

## 2013-01-15 DIAGNOSIS — R5383 Other fatigue: Secondary | ICD-10-CM | POA: Diagnosis not present

## 2013-01-19 DIAGNOSIS — C3430 Malignant neoplasm of lower lobe, unspecified bronchus or lung: Secondary | ICD-10-CM | POA: Diagnosis not present

## 2013-03-09 DIAGNOSIS — C3430 Malignant neoplasm of lower lobe, unspecified bronchus or lung: Secondary | ICD-10-CM | POA: Diagnosis not present

## 2013-03-09 DIAGNOSIS — R5381 Other malaise: Secondary | ICD-10-CM | POA: Diagnosis not present

## 2013-03-09 DIAGNOSIS — C4491 Basal cell carcinoma of skin, unspecified: Secondary | ICD-10-CM | POA: Diagnosis not present

## 2013-03-09 DIAGNOSIS — C3490 Malignant neoplasm of unspecified part of unspecified bronchus or lung: Secondary | ICD-10-CM | POA: Diagnosis not present

## 2013-03-24 DIAGNOSIS — C3430 Malignant neoplasm of lower lobe, unspecified bronchus or lung: Secondary | ICD-10-CM | POA: Diagnosis not present

## 2013-04-19 DIAGNOSIS — J449 Chronic obstructive pulmonary disease, unspecified: Secondary | ICD-10-CM | POA: Diagnosis not present

## 2013-04-19 DIAGNOSIS — Z23 Encounter for immunization: Secondary | ICD-10-CM | POA: Diagnosis not present

## 2013-07-29 ENCOUNTER — Other Ambulatory Visit (HOSPITAL_COMMUNITY): Payer: Self-pay | Admitting: Neurosurgery

## 2013-07-29 DIAGNOSIS — Z48812 Encounter for surgical aftercare following surgery on the circulatory system: Secondary | ICD-10-CM

## 2013-07-29 DIAGNOSIS — I739 Peripheral vascular disease, unspecified: Secondary | ICD-10-CM

## 2013-07-30 ENCOUNTER — Ambulatory Visit (HOSPITAL_COMMUNITY)
Admission: RE | Admit: 2013-07-30 | Discharge: 2013-07-30 | Disposition: A | Discharge status: Home / Self Care | Payer: Medicare Other | Source: Ambulatory Visit | Attending: Internal Medicine | Admitting: Internal Medicine

## 2013-07-30 DIAGNOSIS — I739 Peripheral vascular disease, unspecified: Secondary | ICD-10-CM | POA: Diagnosis not present

## 2013-07-30 DIAGNOSIS — I70219 Atherosclerosis of native arteries of extremities with intermittent claudication, unspecified extremity: Secondary | ICD-10-CM | POA: Diagnosis not present

## 2013-07-30 DIAGNOSIS — Z48812 Encounter for surgical aftercare following surgery on the circulatory system: Secondary | ICD-10-CM

## 2013-07-30 NOTE — Progress Notes (Signed)
Right Lower Ext. Arterial Duplex Completed. Oda Cogan, BS, RDMS, RVT

## 2013-08-03 ENCOUNTER — Telehealth: Payer: Self-pay | Admitting: Cardiovascular Disease

## 2013-08-03 ENCOUNTER — Encounter (INDEPENDENT_AMBULATORY_CARE_PROVIDER_SITE_OTHER): Payer: Self-pay

## 2013-08-03 NOTE — Telephone Encounter (Signed)
New code generated and mailed.

## 2013-08-03 NOTE — Telephone Encounter (Signed)
He would like for you to mail his code for my-chart to him please.

## 2013-08-11 ENCOUNTER — Encounter: Payer: Self-pay | Admitting: *Deleted

## 2013-08-11 ENCOUNTER — Telehealth: Payer: Self-pay | Admitting: *Deleted

## 2013-08-11 DIAGNOSIS — I739 Peripheral vascular disease, unspecified: Secondary | ICD-10-CM

## 2013-08-11 NOTE — Telephone Encounter (Signed)
Message copied by Chauncy Lean on Wed Aug 11, 2013  9:45 AM ------      Message from: Lorretta Harp      Created: Mon Aug 09, 2013  8:32 PM       No change from prior study. Repeat in 12 months. ------

## 2013-08-11 NOTE — Telephone Encounter (Signed)
Orders placed for repeat lower ext doppler in 1 year

## 2013-08-16 ENCOUNTER — Encounter: Payer: Self-pay | Admitting: *Deleted

## 2013-08-16 ENCOUNTER — Ambulatory Visit: Payer: Medicare Other | Admitting: Family

## 2013-08-16 ENCOUNTER — Inpatient Hospital Stay (HOSPITAL_COMMUNITY)
Admission: RE | Admit: 2013-08-16 | Discharge: 2013-08-16 | Disposition: A | Discharge status: Home / Self Care | Payer: Medicare Other | Source: Ambulatory Visit

## 2013-08-16 ENCOUNTER — Inpatient Hospital Stay (HOSPITAL_COMMUNITY)
Admission: RE | Admit: 2013-08-16 | Discharge: 2013-08-16 | Disposition: A | Discharge status: Home / Self Care | Payer: Medicare Other | Source: Ambulatory Visit | Attending: Neurosurgery | Admitting: Neurosurgery

## 2013-08-16 DIAGNOSIS — Z48812 Encounter for surgical aftercare following surgery on the circulatory system: Secondary | ICD-10-CM

## 2013-08-16 DIAGNOSIS — I739 Peripheral vascular disease, unspecified: Secondary | ICD-10-CM

## 2013-08-17 ENCOUNTER — Ambulatory Visit: Payer: Medicare Other | Admitting: Neurosurgery

## 2013-09-03 DIAGNOSIS — I10 Essential (primary) hypertension: Secondary | ICD-10-CM | POA: Diagnosis not present

## 2013-09-03 DIAGNOSIS — J449 Chronic obstructive pulmonary disease, unspecified: Secondary | ICD-10-CM | POA: Diagnosis not present

## 2013-09-03 DIAGNOSIS — Z7189 Other specified counseling: Secondary | ICD-10-CM | POA: Diagnosis not present

## 2013-09-03 DIAGNOSIS — Z85118 Personal history of other malignant neoplasm of bronchus and lung: Secondary | ICD-10-CM | POA: Diagnosis not present

## 2013-09-03 DIAGNOSIS — F4310 Post-traumatic stress disorder, unspecified: Secondary | ICD-10-CM | POA: Diagnosis not present

## 2013-09-03 DIAGNOSIS — I2510 Atherosclerotic heart disease of native coronary artery without angina pectoris: Secondary | ICD-10-CM | POA: Diagnosis not present

## 2013-09-03 DIAGNOSIS — G4700 Insomnia, unspecified: Secondary | ICD-10-CM | POA: Diagnosis not present

## 2013-09-03 DIAGNOSIS — F17200 Nicotine dependence, unspecified, uncomplicated: Secondary | ICD-10-CM | POA: Diagnosis not present

## 2013-09-08 ENCOUNTER — Telehealth: Payer: Self-pay | Admitting: *Deleted

## 2013-09-08 NOTE — Telephone Encounter (Signed)
Called pt with appt with Dr. Julien Nordmann 09/22/13 at 11:00 labs and 11:30 Dr. Julien Nordmann.  He verbalized understanding of appt time and place.  I called referring office and left a vm message with Margreta Journey that appt has been made and pt is aware.

## 2013-09-10 ENCOUNTER — Ambulatory Visit: Payer: Medicare Other | Admitting: Cardiovascular Disease

## 2013-09-13 ENCOUNTER — Other Ambulatory Visit: Payer: Self-pay | Admitting: *Deleted

## 2013-09-20 ENCOUNTER — Encounter: Payer: Self-pay | Admitting: *Deleted

## 2013-09-20 NOTE — [facility] Oncology Navigator Note (Unsigned)
Pt and wife called and wanted to change appt to April.  Appt was changed gave them appt time and place.

## 2013-09-22 ENCOUNTER — Ambulatory Visit: Payer: Medicare Other

## 2013-09-22 ENCOUNTER — Ambulatory Visit: Payer: Medicare Other | Admitting: Internal Medicine

## 2013-09-22 ENCOUNTER — Other Ambulatory Visit: Payer: Medicare Other

## 2013-10-13 ENCOUNTER — Ambulatory Visit: Payer: Medicare Other | Admitting: Cardiovascular Disease

## 2013-10-29 DIAGNOSIS — E119 Type 2 diabetes mellitus without complications: Secondary | ICD-10-CM | POA: Diagnosis not present

## 2013-10-29 DIAGNOSIS — I2510 Atherosclerotic heart disease of native coronary artery without angina pectoris: Secondary | ICD-10-CM | POA: Diagnosis not present

## 2013-10-29 DIAGNOSIS — J449 Chronic obstructive pulmonary disease, unspecified: Secondary | ICD-10-CM | POA: Diagnosis not present

## 2013-10-29 DIAGNOSIS — G4700 Insomnia, unspecified: Secondary | ICD-10-CM | POA: Diagnosis not present

## 2013-10-29 DIAGNOSIS — I10 Essential (primary) hypertension: Secondary | ICD-10-CM | POA: Diagnosis not present

## 2013-10-29 DIAGNOSIS — Z0000 Encounter for general adult medical examination without abnormal findings: Secondary | ICD-10-CM | POA: Diagnosis not present

## 2013-10-29 DIAGNOSIS — M549 Dorsalgia, unspecified: Secondary | ICD-10-CM | POA: Diagnosis not present

## 2013-10-29 DIAGNOSIS — Z1331 Encounter for screening for depression: Secondary | ICD-10-CM | POA: Diagnosis not present

## 2013-11-02 ENCOUNTER — Ambulatory Visit: Payer: Medicare Other

## 2013-11-02 ENCOUNTER — Other Ambulatory Visit: Payer: Medicare Other

## 2013-11-02 ENCOUNTER — Encounter: Payer: Self-pay | Admitting: Internal Medicine

## 2013-11-02 ENCOUNTER — Ambulatory Visit (HOSPITAL_BASED_OUTPATIENT_CLINIC_OR_DEPARTMENT_OTHER): Payer: Medicare Other | Admitting: Internal Medicine

## 2013-11-02 VITALS — BP 125/66 | HR 75 | Temp 97.5°F | Resp 20 | Ht 72.0 in | Wt 214.6 lb

## 2013-11-02 DIAGNOSIS — I10 Essential (primary) hypertension: Secondary | ICD-10-CM

## 2013-11-02 DIAGNOSIS — C3430 Malignant neoplasm of lower lobe, unspecified bronchus or lung: Secondary | ICD-10-CM | POA: Diagnosis not present

## 2013-11-02 DIAGNOSIS — F17200 Nicotine dependence, unspecified, uncomplicated: Secondary | ICD-10-CM | POA: Diagnosis not present

## 2013-11-02 DIAGNOSIS — J449 Chronic obstructive pulmonary disease, unspecified: Secondary | ICD-10-CM

## 2013-11-02 DIAGNOSIS — C3490 Malignant neoplasm of unspecified part of unspecified bronchus or lung: Secondary | ICD-10-CM

## 2013-11-02 NOTE — Progress Notes (Signed)
Checked in new patient with no financial issues.  °

## 2013-11-02 NOTE — Progress Notes (Signed)
[place] [phone]   [phone]  CONSULT NOTE  REFERRING PHYSICIAN: Dr. Collier Flowers  REASON FOR CONSULTATION:  73 years old white male with history of lung cancer.  HPI John Ball is a 73 y.o. male with long history of smoking and past medical history significant for multiple medical problems including history of peripheral vascular disease, hypertension, dyslipidemia, depression, coronary heart disease status post CABG under the care of Dr. Roxan Hockey in addition to a history of a stage I lung cancer status post resection in October of 2012.  The patient mentions that in the fall of 2012 he had routine chest x-ray performed at the [facility] hospital which showed a nodule in the right lower lobe. He was referred to a thoracic surgeon at [facility] and underwent right lower lobectomy for stage IA non-small cell lung cancer in October of 2012. Unfortunately I don't have any record of this surgery or the subtype of the non-small cell lung cancer. The patient knows to [facility] and he was followed by Dr. Lyda Kalata with routine CT scan every 6 months for the last 2 years. He has no evidence for disease recurrence on these CT scan. His last scan was performed in October of 2014. The plan by Dr. Lyda Kalata was to scan him on annual basis after the October scan. The patient lives between [place] and [place] but most of his physicians are in [place]. He was referred to me today for evaluation and to establish care with me for further monitoring of his history of lung cancer. The patient is feeling fine today with no specific complaints except for shortness breath with exertion and mild cough especially in the morning with clear sputum. He also use home oxygen at night time. He denied having any significant chest pain or hemoptysis. He denied having any significant weight loss or night sweats. He has no visual changes or headache. His  family history significant for a mother who died from abdominal aortic aneurysm at age 72, father died from [place] and brother with throat cancer. The patient is married and has 2 sons. He was accompanied by his wife John Ball. 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 The patient is a current smoker and he smoked less than one pack per day for the last 58 years and unfortunately continues to smoke. I strongly advise him to quit smoking and offered him to smoke cessation program. He has no history of alcohol or drug abuse. HPI  Past Medical History  Diagnosis Date  . Hypertension   . Hyperlipidemia   . COPD (chronic obstructive pulmonary disease)   . Depression   . Pain     legs and feet  . Coronary heart disease   . Lung nodules   . Cancer Oct 2012    Lung    Past Surgical History  Procedure Laterality Date  . Abdominal aortogram  09/16/2008    bilateral lower extremity runoff arteriography  . Extremity arteriogram  09/16/2008    right lower  . Right femoral popliteral bypass  10/24/2008    6-mm Gore Tex Graft  . Thrombectomy      right femoral to above knee popliteral Gore-Tex graft  . Angioplasty      Dacron patch of Gore-Tex and saphenour vein junction and above knee popliteral artery  . Heart bypass      Family History  Problem Relation Age of Onset  . Other Mother 26    Abdominal Aortic Aneurysm   . Alzheimer's disease Father 77    Social History History  Substance Use Topics  . Smoking status: Current Every Day Smoker -- 0.60 packs/day for 55 years    Types: Cigarettes  . Smokeless tobacco: Never Used  . Alcohol Use: No    Allergies  Allergen Reactions  . Aripiprazole     "makes him wild"  . Aspirin     Causes GI Bleed  . Crestor [Rosuvastatin Calcium] Itching  . Effexor [Venlafaxine Hydrochloride] Itching  . Lipitor [Atorvastatin Calcium] Itching  . Morphine And Related Itching  .  Zocor [Simvastatin - High Dose] Itching    Current Outpatient Prescriptions  Medication Sig Dispense Refill  . albuterol (PROVENTIL) (5 MG/ML) 0.5% nebulizer solution Take 2.5 mg by nebulization every 6 (six) hours as needed for wheezing or shortness of breath.      . clonazePAM (KLONOPIN) 1 MG tablet Take 1 mg by mouth 2 (two) times daily as needed.      . DULoxetine (CYMBALTA) 60 MG capsule Take 60 mg by mouth daily.       . Fluticasone-Salmeterol (ADVAIR DISKUS) 250-50 MCG/DOSE AEPB Inhale 1 puff into the lungs every 12 (twelve) hours.      Marland Kitchen QUEtiapine (SEROQUEL XR) 300 MG 24 hr tablet Take 300 mg by mouth at bedtime.       . Ramipril (ALTACE PO) Take 10 mg by mouth daily.        . Tiotropium Bromide Monohydrate (SPIRIVA HANDIHALER IN) Inhale into the lungs.      . traZODone (DESYREL) 150 MG tablet Take 150 mg by mouth at bedtime.      Marland Kitchen zolpidem (AMBIEN CR) 12.5 MG CR tablet Take 12.5 mg by mouth at bedtime.        Marland Kitchen acetaminophen (TYLENOL) 500 MG tablet Take 500 mg by mouth every 6 (six) hours as needed.        . orphenadrine (NORFLEX) 100 MG tablet Take 100 mg by mouth 2 (two) times daily as needed. As needed to relax muscles.        No current facility-administered medications for this visit.    Review of Systems  Constitutional: negative Eyes: negative Ears, nose, mouth, throat, and face: negative Respiratory: positive for cough, dyspnea on exertion and sputum Cardiovascular: negative Gastrointestinal: negative Genitourinary:negative Integument/breast: negative Hematologic/lymphatic: negative Musculoskeletal:negative Neurological: negative Behavioral/Psych: negative Endocrine: negative Allergic/Immunologic: negative  Physical Exam  YIR:SWNIO, healthy, no distress, well nourished and well developed SKIN: skin color, texture, turgor are normal, no rashes or significant lesions HEAD: Normocephalic, No masses, lesions, tenderness or abnormalities EYES: normal,  PERRLA EARS: External ears normal, Canals clear OROPHARYNX:no exudate, no erythema and lips, buccal mucosa, and tongue normal  NECK: supple, no adenopathy, no JVD LYMPH:  no palpable lymphadenopathy, no hepatosplenomegaly LUNGS: clear to auscultation , and palpation HEART: regular rate & rhythm, no murmurs and no gallops ABDOMEN:abdomen soft, non-tender, normal bowel sounds and no masses or organomegaly BACK: Back symmetric, no curvature., No CVA tenderness EXTREMITIES:no joint deformities, effusion, or inflammation, no edema, no skin discoloration, no clubbing  NEURO: alert & oriented x 3 with fluent speech, no focal motor/sensory deficits  PERFORMANCE STATUS: ECOG 1  LABORATORY DATA: Lab Results  Component Value Date   WBC 11.2* 06/27/2011   HGB 15.4 06/27/2011   HCT 44.6 06/27/2011   MCV 92.9 06/27/2011   PLT 179 06/27/2011  Chemistry      Component Value Date/Time   NA 135 06/27/2011 1306   K 3.9 06/27/2011 1306   CL 99 06/27/2011 1306   CO2 24 06/27/2011 1306   BUN 28* 06/27/2011 1306   CREATININE 0.93 06/27/2011 1306      Component Value Date/Time   CALCIUM 9.3 06/27/2011 1306   ALKPHOS 86 06/27/2011 1306   AST 33 06/27/2011 1306   ALT 36 06/27/2011 1306   BILITOT 0.3 06/27/2011 1306       RADIOGRAPHIC STUDIES: No results found.  ASSESSMENT: This is a very pleasant 74 years old white male with history of stage IA non-small cell lung cancer status post right lower lobectomy in October of 2012.   PLAN: I have a lengthy discussion with the patient and his wife today about his current condition and further evaluation of his disease. I explained to the patient that at this point I would agree with Dr. Lyda Kalata by considering him for his annual evaluation with repeat CT scan of the chest. His last scan was performed in October of 2014 with no evidence for disease recurrence. I will try to get some of his records from [facility] where he had the  surgery. The patient would like to have his scan and care to be done in [place].  I will arrange for him to have repeat CT scan of the chest with contrast in 6 months He would come back for followup visit at that time. The patient was advised to call immediately if he has any concerning symptoms in the interval. The patient voices understanding of current disease status and treatment options and is in agreement with the current care plan.  All questions were answered. The patient knows to call the clinic with any problems, questions or concerns. We can certainly see the patient much sooner if necessary.  Thank you so much for allowing me to participate in the care of EDGE MAUGER. I will continue to follow up the patient with you and assist in his care.  I spent 35 minutes counseling the patient face to face. The total time spent in the appointment was 55 minutes.  Disclaimer: This note was dictated with voice recognition software. Similar sounding words can inadvertently be transcribed and may not be corrected upon review.   Curt Bears 11/02/2013, 12:20 PM

## 2013-11-03 ENCOUNTER — Telehealth: Payer: Self-pay | Admitting: Internal Medicine

## 2013-11-03 NOTE — Telephone Encounter (Signed)
s.w. pt wife and advised on OCT adn NOV appt....ok and aware

## 2014-01-06 ENCOUNTER — Inpatient Hospital Stay (HOSPITAL_COMMUNITY)
Admission: EM | Admit: 2014-01-06 | Discharge: 2014-01-08 | DRG: 315 | Disposition: A | Discharge status: Home / Self Care | Payer: Medicare Other | Attending: Vascular Surgery | Admitting: Vascular Surgery

## 2014-01-06 ENCOUNTER — Emergency Department (HOSPITAL_COMMUNITY): Payer: Medicare Other

## 2014-01-06 ENCOUNTER — Encounter (HOSPITAL_COMMUNITY): Payer: Self-pay | Admitting: Emergency Medicine

## 2014-01-06 DIAGNOSIS — I739 Peripheral vascular disease, unspecified: Secondary | ICD-10-CM | POA: Diagnosis not present

## 2014-01-06 DIAGNOSIS — I743 Embolism and thrombosis of arteries of the lower extremities: Secondary | ICD-10-CM | POA: Diagnosis present

## 2014-01-06 DIAGNOSIS — Y832 Surgical operation with anastomosis, bypass or graft as the cause of abnormal reaction of the patient, or of later complication, without mention of misadventure at the time of the procedure: Secondary | ICD-10-CM | POA: Diagnosis present

## 2014-01-06 DIAGNOSIS — J449 Chronic obstructive pulmonary disease, unspecified: Secondary | ICD-10-CM | POA: Diagnosis present

## 2014-01-06 DIAGNOSIS — E785 Hyperlipidemia, unspecified: Secondary | ICD-10-CM | POA: Diagnosis present

## 2014-01-06 DIAGNOSIS — I2510 Atherosclerotic heart disease of native coronary artery without angina pectoris: Secondary | ICD-10-CM | POA: Diagnosis present

## 2014-01-06 DIAGNOSIS — Z85118 Personal history of other malignant neoplasm of bronchus and lung: Secondary | ICD-10-CM | POA: Diagnosis not present

## 2014-01-06 DIAGNOSIS — F3289 Other specified depressive episodes: Secondary | ICD-10-CM | POA: Diagnosis present

## 2014-01-06 DIAGNOSIS — T82898A Other specified complication of vascular prosthetic devices, implants and grafts, initial encounter: Principal | ICD-10-CM | POA: Diagnosis present

## 2014-01-06 DIAGNOSIS — I10 Essential (primary) hypertension: Secondary | ICD-10-CM | POA: Diagnosis present

## 2014-01-06 DIAGNOSIS — J4489 Other specified chronic obstructive pulmonary disease: Secondary | ICD-10-CM | POA: Diagnosis present

## 2014-01-06 DIAGNOSIS — G3189 Other specified degenerative diseases of nervous system: Secondary | ICD-10-CM | POA: Diagnosis not present

## 2014-01-06 DIAGNOSIS — N739 Female pelvic inflammatory disease, unspecified: Secondary | ICD-10-CM | POA: Diagnosis not present

## 2014-01-06 DIAGNOSIS — Z79899 Other long term (current) drug therapy: Secondary | ICD-10-CM | POA: Diagnosis not present

## 2014-01-06 DIAGNOSIS — F17200 Nicotine dependence, unspecified, uncomplicated: Secondary | ICD-10-CM | POA: Diagnosis present

## 2014-01-06 DIAGNOSIS — F329 Major depressive disorder, single episode, unspecified: Secondary | ICD-10-CM | POA: Diagnosis present

## 2014-01-06 DIAGNOSIS — Z951 Presence of aortocoronary bypass graft: Secondary | ICD-10-CM

## 2014-01-06 DIAGNOSIS — I82509 Chronic embolism and thrombosis of unspecified deep veins of unspecified lower extremity: Secondary | ICD-10-CM | POA: Diagnosis not present

## 2014-01-06 DIAGNOSIS — Z01810 Encounter for preprocedural cardiovascular examination: Secondary | ICD-10-CM | POA: Diagnosis not present

## 2014-01-06 DIAGNOSIS — M79609 Pain in unspecified limb: Secondary | ICD-10-CM

## 2014-01-06 DIAGNOSIS — I70209 Unspecified atherosclerosis of native arteries of extremities, unspecified extremity: Secondary | ICD-10-CM

## 2014-01-06 LAB — BASIC METABOLIC PANEL
Anion gap: 16 — ABNORMAL HIGH (ref 5–15)
BUN: 16 mg/dL (ref 6–23)
CO2: 22 meq/L (ref 19–32)
Calcium: 9.2 mg/dL (ref 8.4–10.5)
Chloride: 100 mEq/L (ref 96–112)
Creatinine, Ser: 0.99 mg/dL (ref 0.50–1.35)
GFR calc Af Amer: >90 mL/min (ref >90)
GFR, EST NON AFRICAN AMERICAN: 81 mL/min — AB (ref >90)
GLUCOSE: 107 mg/dL — AB (ref 70–99)
POTASSIUM: 4.6 meq/L (ref 3.7–5.3)
Sodium: 138 mEq/L (ref 137–147)

## 2014-01-06 LAB — CBC WITH DIFFERENTIAL/PLATELET
Basophils Absolute: 0 10*3/uL (ref 0.0–0.1)
Basophils Relative: 0 % (ref 0–1)
Eosinophils Absolute: 0.2 10*3/uL (ref 0.0–0.7)
Eosinophils Relative: 3 % (ref 0–5)
HCT: 43.1 % (ref 39.0–52.0)
HEMOGLOBIN: 14.8 g/dL (ref 13.0–17.0)
LYMPHS ABS: 1.8 10*3/uL (ref 0.7–4.0)
Lymphocytes Relative: 31 % (ref 12–46)
MCH: 32.5 pg (ref 26.0–34.0)
MCHC: 34.3 g/dL (ref 30.0–36.0)
MCV: 94.5 fL (ref 78.0–100.0)
MONOS PCT: 6 % (ref 3–12)
Monocytes Absolute: 0.3 10*3/uL (ref 0.1–1.0)
NEUTROS PCT: 60 % (ref 43–77)
Neutro Abs: 3.5 10*3/uL (ref 1.7–7.7)
Platelets: 142 10*3/uL — ABNORMAL LOW (ref 150–400)
RBC: 4.56 MIL/uL (ref 4.22–5.81)
RDW: 13.8 % (ref 11.5–15.5)
WBC: 5.8 10*3/uL (ref 4.0–10.5)

## 2014-01-06 LAB — APTT: aPTT: 31 seconds (ref 24–37)

## 2014-01-06 LAB — PROTIME-INR
INR: 1.01 (ref 0.00–1.49)
PROTHROMBIN TIME: 13.3 s (ref 11.6–15.2)

## 2014-01-06 LAB — FIBRINOGEN: FIBRINOGEN: 431 mg/dL (ref 204–475)

## 2014-01-06 MED ORDER — FENTANYL CITRATE 0.05 MG/ML IJ SOLN
INTRAMUSCULAR | Status: AC
  Filled 2014-01-06: qty 2

## 2014-01-06 MED ORDER — HEPARIN SODIUM (PORCINE) 1000 UNIT/ML IJ SOLN
INTRAMUSCULAR | Status: AC | PRN
  Administered 2014-01-06: 3000 [IU] via INTRAVENOUS

## 2014-01-06 MED ORDER — MIDAZOLAM HCL 2 MG/2ML IJ SOLN
INTRAMUSCULAR | Status: AC | PRN
  Administered 2014-01-06 (×2): 0.5 mg via INTRAVENOUS
  Administered 2014-01-06 (×2): 1 mg via INTRAVENOUS

## 2014-01-06 MED ORDER — FENTANYL CITRATE 0.05 MG/ML IJ SOLN
50.0000 ug | Freq: Once | INTRAMUSCULAR | Status: AC
  Administered 2014-01-06: 50 ug via INTRAVENOUS
  Filled 2014-01-06: qty 2

## 2014-01-06 MED ORDER — HEPARIN BOLUS VIA INFUSION
4000.0000 [IU] | Freq: Once | INTRAVENOUS | Status: DC
  Filled 2014-01-06: qty 4000

## 2014-01-06 MED ORDER — SODIUM CHLORIDE 0.9 % IV SOLN
INTRAVENOUS | Status: DC
  Administered 2014-01-07: 08:00:00 via INTRAVENOUS

## 2014-01-06 MED ORDER — HYDROCODONE-ACETAMINOPHEN 5-325 MG PO TABS
1.0000 | ORAL_TABLET | ORAL | Status: DC | PRN
  Administered 2014-01-06 – 2014-01-08 (×8): 2 via ORAL
  Filled 2014-01-06 (×8): qty 2

## 2014-01-06 MED ORDER — IODIXANOL 320 MG/ML IV SOLN
100.0000 mL | Freq: Once | INTRAVENOUS | Status: AC | PRN
Start: 2014-01-06 — End: 2014-01-06
  Administered 2014-01-06: 100 mL via INTRAVENOUS

## 2014-01-06 MED ORDER — ONDANSETRON HCL 4 MG/2ML IJ SOLN: 4.0000 mg | Freq: Three times a day (TID) | INTRAMUSCULAR | Status: AC | PRN

## 2014-01-06 MED ORDER — HEPARIN (PORCINE) IN NACL 100-0.45 UNIT/ML-% IJ SOLN
1500.0000 [IU]/h | INTRAMUSCULAR | Status: DC
  Administered 2014-01-06: 1400 [IU]/h via INTRAVENOUS
  Administered 2014-01-07: 1500 [IU]/h via INTRAVENOUS
  Filled 2014-01-06 (×2): qty 250

## 2014-01-06 MED ORDER — SODIUM CHLORIDE 0.9 % IV SOLN
0.5000 mg/h | INTRAVENOUS | Status: DC
  Filled 2014-01-06: qty 0.5

## 2014-01-06 MED ORDER — ONDANSETRON HCL 4 MG/2ML IJ SOLN
4.0000 mg | Freq: Once | INTRAMUSCULAR | Status: AC
  Administered 2014-01-06: 4 mg via INTRAVENOUS
  Filled 2014-01-06: qty 2

## 2014-01-06 MED ORDER — HYDROMORPHONE HCL PF 1 MG/ML IJ SOLN
1.0000 mg | INTRAMUSCULAR | Status: DC | PRN
  Administered 2014-01-06 – 2014-01-08 (×6): 1 mg via INTRAVENOUS
  Filled 2014-01-06 (×7): qty 1

## 2014-01-06 MED ORDER — HEPARIN SODIUM (PORCINE) 1000 UNIT/ML IJ SOLN
INTRAMUSCULAR | Status: AC
  Filled 2014-01-06: qty 1

## 2014-01-06 MED ORDER — FENTANYL CITRATE 0.05 MG/ML IJ SOLN
INTRAMUSCULAR | Status: AC | PRN
Start: 2014-01-06 — End: 2014-01-06
  Administered 2014-01-06 (×2): 50 ug via INTRAVENOUS
  Administered 2014-01-06 (×2): 25 ug via INTRAVENOUS

## 2014-01-06 MED ORDER — HEPARIN (PORCINE) IN NACL 100-0.45 UNIT/ML-% IJ SOLN
950.0000 [IU]/h | INTRAMUSCULAR | Status: DC
  Filled 2014-01-06: qty 250

## 2014-01-06 MED ORDER — MIDAZOLAM HCL 2 MG/2ML IJ SOLN
INTRAMUSCULAR | Status: AC
  Filled 2014-01-06: qty 4

## 2014-01-06 NOTE — ED Notes (Signed)
Pt returned from Vascular and CT, IR notified.

## 2014-01-06 NOTE — Progress Notes (Signed)
Received patient from IR post angiogram, alert and oriented, not on any heparin drip. Md made aware of the admission.VSS,No bleeding noted on the site. Keep patient on bed rest.

## 2014-01-06 NOTE — ED Provider Notes (Deleted)
Medical screening examination/treatment/procedure(s) were performed by non-physician practitioner and as supervising physician I was immediately available for consultation/collaboration.  Richarda Blade, MD 01/06/14 [phone]

## 2014-01-06 NOTE — Sedation Documentation (Signed)
John Ball released pressure to groin and applied dressing. Pt tolerated well.

## 2014-01-06 NOTE — ED Provider Notes (Signed)
CSN: [id number]     Arrival date & time 01/06/14  4158 History   First MD Initiated Contact with Patient 01/06/14 [id number]     Chief Complaint  Patient presents with  . Leg Pain   HPI Comments: Patient is a 73 y.o. Male, 3/4 ppd smoker, who presents for acute onset of right leg and calf pain which started today at 4:00 am.  The patient states that the pain is his leg is a 10/10 pain which radiates to slightly above the knee.  He has not found any aggravating or relieving factors at this time.  He says that this feels very similar to when he has had arterial occlusions in the past.  Patient has had femoropopliteal bypass of the right leg with several revisions and thrombectomy of the right leg.    Patient is a 73 y.o. male presenting with leg pain. The history is provided by the patient. No language interpreter was used.  Leg Pain Associated symptoms: no fatigue and no fever     Past Medical History  Diagnosis Date  . Hypertension   . Hyperlipidemia   . COPD (chronic obstructive pulmonary disease)   . Depression   . Pain     legs and feet  . Coronary heart disease   . Lung nodules   . Cancer Oct 2012    Lung   Past Surgical History  Procedure Laterality Date  . Abdominal aortogram  09/16/2008    bilateral lower extremity runoff arteriography  . Extremity arteriogram  09/16/2008    right lower  . Right femoral popliteral bypass  10/24/2008    6-mm Gore Tex Graft  . Thrombectomy      right femoral to above knee popliteral Gore-Tex graft  . Angioplasty      Dacron patch of Gore-Tex and saphenour vein junction and above knee popliteral artery  . Heart bypass     Family History  Problem Relation Age of Onset  . Other Mother 17    Abdominal Aortic Aneurysm   . Alzheimer's disease Father 53   History  Substance Use Topics  . Smoking status: Current Every Day Smoker -- 0.60 packs/day for 55 years    Types: Cigarettes  . Smokeless tobacco: Never Used  . Alcohol Use: No    Review  of Systems  Constitutional: Negative for fever, chills and fatigue.  Respiratory: Negative for chest tightness and shortness of breath.   Cardiovascular: Negative for chest pain, palpitations and leg swelling.  Gastrointestinal: Negative for nausea, vomiting, abdominal pain, diarrhea, constipation and blood in stool.  All other systems reviewed and are negative.     Allergies  Aripiprazole; Aspirin; Crestor; Effexor; Lipitor; Morphine and related; and Zocor  Home Medications   Prior to Admission medications   Medication Sig Start Date End Date Taking? Authorizing Provider  acetaminophen (TYLENOL) 500 MG tablet Take 500 mg by mouth every 6 (six) hours as needed for headache (pain).    Yes Historical Provider, MD  albuterol (PROVENTIL) (5 MG/ML) 0.5% nebulizer solution Take 2.5 mg by nebulization every 6 (six) hours as needed for wheezing or shortness of breath.   Yes Historical Provider, MD  DULoxetine (CYMBALTA) 60 MG capsule Take 60 mg by mouth daily.    Yes Historical Provider, MD  Fluticasone-Salmeterol (ADVAIR DISKUS) 250-50 MCG/DOSE AEPB Inhale 1 puff into the lungs every 12 (twelve) hours.   Yes Historical Provider, MD  QUEtiapine (SEROQUEL XR) 300 MG 24 hr tablet Take 300 mg by mouth at  bedtime.    Yes Historical Provider, MD  ramipril (ALTACE) 10 MG capsule Take 10 mg by mouth daily.   Yes Historical Provider, MD  tiotropium (SPIRIVA) 18 MCG inhalation capsule Place 18 mcg into inhaler and inhale daily.   Yes Historical Provider, MD  traZODone (DESYREL) 150 MG tablet Take 150 mg by mouth at bedtime.   Yes Historical Provider, MD  zolpidem (AMBIEN CR) 12.5 MG CR tablet Take 12.5 mg by mouth at bedtime.     Yes Historical Provider, MD   BP 113/89  Pulse 71  Temp(Src) 97.8 F (36.6 C) (Oral)  Resp 17  Ht 6' (1.829 m)  Wt 212 lb (96.163 kg)  BMI 28.75 kg/m2  SpO2 94% Physical Exam  Nursing note and vitals reviewed. Constitutional: He is oriented to person, place, and time.  He appears well-developed and well-nourished. No distress.  HENT:  Head: Normocephalic and atraumatic.  Mouth/Throat: Oropharynx is clear and moist. No oropharyngeal exudate.  Eyes: Conjunctivae and EOM are normal. Pupils are equal, round, and reactive to light. No scleral icterus.  Neck: Normal range of motion. Neck supple. No thyromegaly present.  Cardiovascular: Normal rate, regular rhythm and normal heart sounds.   Right foot is cool to touch without cyanosis or color change.  Sensation to light touch intact in the right foot.  Active ROM is demonstrated in the right foot.  Full active and passive ROM.  No palpable cords.  No palpable DP or PT pulses at this time.    Pulmonary/Chest: Effort normal and breath sounds normal. No respiratory distress. He has no wheezes. He has no rales. He exhibits no tenderness.  Abdominal: Soft. Bowel sounds are normal. He exhibits no distension and no mass. There is no tenderness. There is no rebound and no guarding.  Musculoskeletal: Normal range of motion.  Lymphadenopathy:    He has no cervical adenopathy.  Neurological: He is alert and oriented to person, place, and time.  Skin: Skin is dry. No rash noted. He is not diaphoretic.  Psychiatric: He has a normal mood and affect. His behavior is normal. Judgment and thought content normal.    ED Course  Procedures (including critical care time) Labs Review Labs Reviewed  CBC WITH DIFFERENTIAL - Abnormal; Notable for the following:    Platelets 142 (*)    All other components within normal limits  BASIC METABOLIC PANEL - Abnormal; Notable for the following:    Glucose, Bld 107 (*)    GFR calc non Af Amer 81 (*)    Anion gap 16 (*)    All other components within normal limits  APTT  FIBRINOGEN  PROTIME-INR    Imaging Review No results found.   EKG Interpretation   Date/Time:  Thursday January 06 2014 10:03:00 EDT Ventricular Rate:  66 PR Interval:  197 QRS Duration: 100 QT Interval:   445 QTC Calculation: 466 R Axis:   28 Text Interpretation:  Sinus rhythm Low voltage, precordial leads Baseline  wander in lead(s) V2 since last tracing no significant change Confirmed by  Eulis Foster  MD, ELLIOTT ([id number]) on 01/06/2014 10:16:20 AM      MDM   Final diagnoses:  Arterial occlusion, lower extremity  PAD (peripheral artery disease)   Patient is a 73 y.o. Male with history of PAD who presents with acute onset of right leg pain which awoke him from sleep.  Pulses were not palpable in the ED, and I was unable to find pulses with bedside doppler.  Dr. Eulis Foster  was made aware of lack of pulses and he was also unable to find them at bedside.  ABIs were ordered and are currently pending at this time.  Dr. Scot Dock from vascular surgery was consulted and basic labs and EKG were performed here in the ED in the event they were needed for surgical clearance.  They were unremarkable.  Dr. Scot Dock paged interventional radiology who are attempting to perform possible thrombolysis at this time.  They have ordered a head CT to rule out head bleed before use of lytic therapy.  Patient to be admitted to 2100 after thrombolytic procedure is performed.  Dr. Scot Dock will be the accepting attending.      Kenard Gower, PA-C 01/06/14 1417

## 2014-01-06 NOTE — Progress Notes (Addendum)
ANTICOAGULATION CONSULT NOTE - Initial Consult  Pharmacy Consult for heparin Indication: catheter guided thrombolysis    Allergies  Allergen Reactions  . Aripiprazole     "makes him wild"  . Aspirin     Causes GI Bleed  . Crestor [Rosuvastatin Calcium] Itching  . Effexor [Venlafaxine Hydrochloride] Itching  . Lipitor [Atorvastatin Calcium] Itching  . Morphine And Related Itching  . Zocor [Simvastatin - High Dose] Itching    Patient Measurements: Height: 6' (182.9 cm) Weight: 212 lb (96.163 kg) IBW/kg (Calculated) : 77.6   Vital Signs: Temp: 97.8 F (36.6 C) (07/02 0847) Temp src: Oral (07/02 0847) BP: 106/64 mmHg (07/02 1357) Pulse Rate: 74 (07/02 1357)  Labs:  Recent Labs  01/06/14 0954 01/06/14 1211  HGB 14.8  --   HCT 43.1  --   PLT 142*  --   APTT  --  31  LABPROT  --  13.3  INR  --  1.01  CREATININE 0.99  --     Estimated Creatinine Clearance: 83.5 ml/min (by C-G formula based on Cr of 0.99).   Medical History: Past Medical History  Diagnosis Date  . Hypertension   . Hyperlipidemia   . COPD (chronic obstructive pulmonary disease)   . Depression   . Pain     legs and feet  . Coronary heart disease   . Lung nodules   . Cancer Oct 2012    Lung      Assessment: 73 yo male for catheter guided thrombolysis of occluded Rt fem-pop bypass graft with possible angioplasty/stent placement to begin heparin with TNK at 0.5 mg/hr.  Goal of Therapy:  Heparin level= 0.2-0.5 Monitor platelets by anticoagulation protocol: Yes   Plan:  -Heparin bolus 4000 units IV followed by 950 units/hr (~ 10 units/kg/hr) -Heparin level in 8 hours and daily wth CBC daily  Hildred Laser, Pharm D 01/06/2014 2:29 PM

## 2014-01-06 NOTE — ED Notes (Signed)
Pt started having pain to the right leg since 0400 but states he has been having cramps in the same leg for about a week and has to stand up to relieve the pain. + pulses.

## 2014-01-06 NOTE — Sedation Documentation (Signed)
Pt moved to stretcher.  Left groin site dry and intact, no evidence of bleeding noted.

## 2014-01-06 NOTE — H&P (Signed)
Agree with PA note.  Discussed catheter directed thrombolysis with patient in depth.  Patient understands the risks and benefits.  Will get a non contrast head CT to make sure there is no gross abnormality because the patient has a history of lung cancer.  Plan for lower extremity arteriogram and probable catheter-directed thrombolysis later today.

## 2014-01-06 NOTE — ED Provider Notes (Signed)
  Face-to-face evaluation   History: Onset right leg pain 4 AM this morning, it awoke him from sleep. He has had some mild, right leg cramping for the last several days.  Physical exam: Alert, cooperative. Right leg, somewhat cooler to touch than left, intact sensation both feet. No palpable pulse in right foot, and no pulse by Doppler in right foot.  Assessment- suspect acute arterial thrombus.   Medical screening examination/treatment/procedure(s) were conducted as a shared visit with non-physician practitioner(s) and myself.  I personally evaluated the patient during the encounter  Richarda Blade, MD 01/06/14 2019

## 2014-01-06 NOTE — Sedation Documentation (Signed)
Contacted vascular PA to place admit orders to surg bed. Pt awakening spontaneously

## 2014-01-06 NOTE — Sedation Documentation (Signed)
Pt lying flat on procedure table, left groin site dressing dry and intact w/ no evidence of bleeding.

## 2014-01-06 NOTE — Procedures (Signed)
Bilateral lower extremity arteriogram was performed and no immediate complication.  Unable to access the occluded right femoral-popliteal bypass and thrombolytic therapy was not performed.  See the Radiology Procedure note for details.

## 2014-01-06 NOTE — ED Notes (Signed)
Spoke with IR, will come pick up patient when they are ready.

## 2014-01-06 NOTE — Sedation Documentation (Signed)
Notified bed control of change of pt's admitting status from ICU to a Surgical bed per Dr. Anselm Pancoast.

## 2014-01-06 NOTE — Sedation Documentation (Signed)
Pt awakened moving, reminded to lie still during the procedure. Pt verbalized understanding.

## 2014-01-06 NOTE — Sedation Documentation (Signed)
Catheter removed and groin site held by [place], RT.

## 2014-01-06 NOTE — ED Notes (Signed)
Paged vascular multiple times, no answer. Called vascular desk, stated pt ready for ABi, needed before surgery. Enroute.

## 2014-01-06 NOTE — Sedation Documentation (Signed)
Pt with eyes closed, resting peacefully.

## 2014-01-06 NOTE — Progress Notes (Signed)
ANTICOAGULATION CONSULT NOTE - Follow Up Consult  Pharmacy Consult for Heparin Indication: right LE ischemia  Allergies  Allergen Reactions  . Aripiprazole     "makes him wild"  . Aspirin     Causes GI Bleed  . Crestor [Rosuvastatin Calcium] Itching  . Effexor [Venlafaxine Hydrochloride] Itching  . Lipitor [Atorvastatin Calcium] Itching  . Morphine And Related Itching  . Zocor [Simvastatin - High Dose] Itching    Patient Measurements: Height: 6' (182.9 cm) Weight: 212 lb (96.163 kg) IBW/kg (Calculated) : 77.6 Heparin Dosing Weight: 96 kg  Vital Signs: Temp: 97.6 F (36.4 C) (07/02 1800) Temp src: Oral (07/02 0847) BP: 137/69 mmHg (07/02 1830) Pulse Rate: 83 (07/02 1830)  Labs:  Recent Labs  01/06/14 0954 01/06/14 1211  HGB 14.8  --   HCT 43.1  --   PLT 142*  --   APTT  --  31  LABPROT  --  13.3  INR  --  1.01  CREATININE 0.99  --     Estimated Creatinine Clearance: 83.5 ml/min (by C-G formula based on Cr of 0.99).  Assessment:   Post-procedure, unable to perform lysis. TNKase was not given.  Heparin not initiated prior to procedure. Heparin drip to begin 6 hrs after angiogram.  RN reports groin site stable. Noted possible discharge in on 7/3.  Goal of Therapy:  Heparin level 0.3-0.7 units/ml Monitor platelets by anticoagulation protocol: Yes   Plan:   Heparin drip to begin at 11:30pm at 1400 units/hr (~ 14 units/kg/hr).   Heparin level in am, ~ 6 hrs after heparin begins.   Daily heparin level and CBC while on heparin.  Arty Baumgartner, [place] Pager: [phone] 01/06/2014,6:55 PM

## 2014-01-06 NOTE — H&P (Signed)
John Ball is an 73 y.o. male.   Chief Complaint: Pt with long history of vascular disease. States he has had severe pain Rt leg and foot since early this am Foot feels cold to him Hx Rt fem to pop bypass 10/2008; above knee to below knee pop bypass 02/2010 Thrombectomy and revision Fem-pop bypass 10/2010 Continues to smoke most of pack cigarettes per day Request has been made to evaluate pt for possible catheter guided thrombolysis of occluded Rt fem-pop bypass graft with possible angioplasty/stent placement per Dr Scot Dock Dr Anselm Pancoast has seen and evaluated pt Now scheduled for this procedure in IR Awaiting ABI results  Of note- pt has hx lung cancer--will obtain CT head w/o contrast before thombolytics are initiated.  HPI: HTN; HLD; COPD; PVD; CAD; Lung Ca  Past Medical History  Diagnosis Date  . Hypertension   . Hyperlipidemia   . COPD (chronic obstructive pulmonary disease)   . Depression   . Pain     legs and feet  . Coronary heart disease   . Lung nodules   . Cancer Oct 2012    Lung    Past Surgical History  Procedure Laterality Date  . Abdominal aortogram  09/16/2008    bilateral lower extremity runoff arteriography  . Extremity arteriogram  09/16/2008    right lower  . Right femoral popliteral bypass  10/24/2008    6-mm Gore Tex Graft  . Thrombectomy      right femoral to above knee popliteral Gore-Tex graft  . Angioplasty      Dacron patch of Gore-Tex and saphenour vein junction and above knee popliteral artery  . Heart bypass      Family History  Problem Relation Age of Onset  . Other Mother 60    Abdominal Aortic Aneurysm   . Alzheimer's disease Father 43   Social History:  reports that he has been smoking Cigarettes.  He has a 33 pack-year smoking history. He has never used smokeless tobacco. He reports that he does not drink alcohol or use illicit drugs.  Allergies:  Allergies  Allergen Reactions  . Aripiprazole     "makes him wild"  . Aspirin    Causes GI Bleed  . Crestor [Rosuvastatin Calcium] Itching  . Effexor [Venlafaxine Hydrochloride] Itching  . Lipitor [Atorvastatin Calcium] Itching  . Morphine And Related Itching  . Zocor [Simvastatin - High Dose] Itching     (Not in a hospital admission)  Results for orders placed during the hospital encounter of 01/06/14 (from the past 48 hour(s))  CBC WITH DIFFERENTIAL     Status: Abnormal   Collection Time    01/06/14  9:54 AM      Result Value Ref Range   WBC 5.8  4.0 - 10.5 K/uL   RBC 4.56  4.22 - 5.81 MIL/uL   Hemoglobin 14.8  13.0 - 17.0 g/dL   HCT 43.1  39.0 - 52.0 %   MCV 94.5  78.0 - 100.0 fL   MCH 32.5  26.0 - 34.0 pg   MCHC 34.3  30.0 - 36.0 g/dL   RDW 13.8  11.5 - 15.5 %   Platelets 142 (*) 150 - 400 K/uL   Neutrophils Relative % 60  43 - 77 %   Neutro Abs 3.5  1.7 - 7.7 K/uL   Lymphocytes Relative 31  12 - 46 %   Lymphs Abs 1.8  0.7 - 4.0 K/uL   Monocytes Relative 6  3 - 12 %  Monocytes Absolute 0.3  0.1 - 1.0 K/uL   Eosinophils Relative 3  0 - 5 %   Eosinophils Absolute 0.2  0.0 - 0.7 K/uL   Basophils Relative 0  0 - 1 %   Basophils Absolute 0.0  0.0 - 0.1 K/uL  BASIC METABOLIC PANEL     Status: Abnormal   Collection Time    01/06/14  9:54 AM      Result Value Ref Range   Sodium 138  137 - 147 mEq/L   Potassium 4.6  3.7 - 5.3 mEq/L   Chloride 100  96 - 112 mEq/L   CO2 22  19 - 32 mEq/L   Glucose, Bld 107 (*) 70 - 99 mg/dL   BUN 16  6 - 23 mg/dL   Creatinine, Ser 0.99  0.50 - 1.35 mg/dL   Calcium 9.2  8.4 - 10.5 mg/dL   GFR calc non Af Amer 81 (*) >90 mL/min   GFR calc Af Amer >90  >90 mL/min   Comment: (NOTE)     The eGFR has been calculated using the CKD EPI equation.     This calculation has not been validated in all clinical situations.     eGFR's persistently <90 mL/min signify possible Chronic Kidney     Disease.   Anion gap 16 (*) 5 - 15   No results found.  Review of Systems  Constitutional: Negative for fever and weight loss.  HENT:  Negative for hearing loss.   Respiratory: Positive for shortness of breath and wheezing.   Cardiovascular: Negative for chest pain and leg swelling.  Gastrointestinal: Negative for nausea, vomiting and abdominal pain.  Musculoskeletal: Negative for back pain.  Neurological: Negative for dizziness, weakness and headaches.  Psychiatric/Behavioral: Positive for substance abuse.       Smoker    Blood pressure 113/89, pulse 71, temperature 97.8 F (36.6 C), temperature source Oral, resp. rate 17, height 6' (1.829 m), weight 96.163 kg (212 lb), SpO2 94.00%. Physical Exam  Constitutional: He is oriented to person, place, and time. He appears well-nourished.  Cardiovascular: Normal rate and regular rhythm.   No murmur heard. Respiratory: Effort normal. He has wheezes.  GI: Soft. Bowel sounds are normal. There is no tenderness.  Musculoskeletal: Normal range of motion.  Rt foot/leg pain Rt foot unable to doppler pulses Rt femoral good pulse Left PT 1+ doppler pulse  Neurological: He is alert and oriented to person, place, and time.  Skin: Skin is warm and dry.  Psychiatric: He has a normal mood and affect. His behavior is normal. Judgment and thought content normal.     Assessment/Plan Severe Rt foot and leg pain since this am Long hx PVD Fem-pop bypass 2010; above to below knee pop bypass 2011; revision 2012 Scheduled now for Rt femoral arteriogram with possible catheter guided thrombolysis of Rt fem-pop bypass graft with possible angioplasty/stent placement Pt and wife aware of procedure benefits and risks and agreeable to proceed Consent signed and in chart  [place] A 01/06/2014, 12:22 PM

## 2014-01-06 NOTE — Progress Notes (Signed)
   VASCULAR PROGRESS NOTE  SUBJECTIVE: Currently no rest pain right foot.  PHYSICAL EXAM:  Motor function intact right foot. Dampened, monophasic posterior tibial and peroneal signal with Doppler. No foot or calf tenderness.  ASSESSMENT AND PLAN:  * Interventional radiology was unable to cannulate his bypass graft. He has extensive collaterals suggesting possible chronic occlusion of his graft. In addition he describes significant claudication for some time. It is not clear why his symptoms became acutely worse when he awoke this morning, but currently he denies any pain in the right foot.  *  As he was not able to undergo thrombolysis of his graft, the only other option would be attempted thrombectomy of his graft. However, it is not clear if the graft has been chronically occluded. In addition, exposure of the above and below knee popliteal artery with interrupted collaterals such that if the graft occluded he would be in worse condition. If the symptoms to progress he might better be served with a entirely new right femoral to posterior tibial artery bypass with vein from the left leg. I've also had a long discussion with him about the importance of tobacco cessation.  * I will start him on intravenous heparin 6 hours from his angiogram with no bolus. This would be used only short-term. If he remains asymptomatic the plan will be for him to be discharged and followed as an outpatient. I will go ahead and order a vein map of the left greater saphenous vein for tomorrow while he is here.  Gae Gallop Beeper: [phone] 01/06/2014

## 2014-01-06 NOTE — ED Notes (Signed)
Pt transported to IR with IR staff.

## 2014-01-06 NOTE — ED Notes (Signed)
Vascular at the bedside. 

## 2014-01-06 NOTE — ED Notes (Signed)
Attempted to doppler right foot for pulses at this time. Unsuccessful at finding pedal pulse or posterior tibial pulse with doppler on right foot. Dr. Eulis Foster at the bedside to assist in finding pulse.

## 2014-01-06 NOTE — ED Notes (Signed)
Courtney, PA at the bedside.  

## 2014-01-06 NOTE — Sedation Documentation (Signed)
ACT requested per Dr. Anselm Pancoast

## 2014-01-06 NOTE — Consult Note (Signed)
[facility] NOTE MRN : [id number]  Reason for Consult: Right lower extremity pulseless and painful.   Referring Physician: ED  History of Present Illness: This is a 73 y/o male who is known to our practice.  His chief complaint is painful leg and foot that has progressed since this morning at 4 am.  He states his foot feels cold.   He had initially had a right femoral to above-knee popliteal bypass in April 2010. This was followed by right above-knee to below-knee popliteal bypass in August of 2011. He presented with occlusion of this and underwent thrombectomy and revision of his femoral-popliteal bypass in April of 2012.  He has also had coronary by-pass in 2004.  His past medical history includes:  Hypertension treated with Altace, Hyperlipidemia not currently treated with statins.  He is allergic to statins and Asprin.  He is not on any antiplatelets or anticoagulants.  He is a chronic tobacco abuser.  He has had ABI's at the [place] within the last 6 months and was told his studies were normal. We have been consulted for symptoms of claudication with rest.    No current facility-administered medications for this encounter.   Current Outpatient Prescriptions  Medication Sig Dispense Refill  . acetaminophen (TYLENOL) 500 MG tablet Take 500 mg by mouth every 6 (six) hours as needed for headache (pain).       Marland Kitchen albuterol (PROVENTIL) (5 MG/ML) 0.5% nebulizer solution Take 2.5 mg by nebulization every 6 (six) hours as needed for wheezing or shortness of breath.      . DULoxetine (CYMBALTA) 60 MG capsule Take 60 mg by mouth daily.       . Fluticasone-Salmeterol (ADVAIR DISKUS) 250-50 MCG/DOSE AEPB Inhale 1 puff into the lungs every 12 (twelve) hours.      Marland Kitchen QUEtiapine (SEROQUEL XR) 300 MG 24 hr tablet Take 300 mg by mouth at bedtime.       . ramipril (ALTACE) 10 MG capsule Take 10 mg by mouth daily.      Marland Kitchen tiotropium (SPIRIVA) 18 MCG inhalation capsule Place 18 mcg  into inhaler and inhale daily.      . traZODone (DESYREL) 150 MG tablet Take 150 mg by mouth at bedtime.      Marland Kitchen zolpidem (AMBIEN CR) 12.5 MG CR tablet Take 12.5 mg by mouth at bedtime.         Pt meds include: Statin :No Betablocker: No ASA: No Other anticoagulants/antiplatelets:   Past Medical History  Diagnosis Date  . Hypertension   . Hyperlipidemia   . COPD (chronic obstructive pulmonary disease)   . Depression   . Pain     legs and feet  . Coronary heart disease   . Lung nodules   . Cancer Oct 2012    Lung   Past Surgical History  Procedure Laterality Date  . Abdominal aortogram  09/16/2008    bilateral lower extremity runoff arteriography  . Extremity arteriogram  09/16/2008    right lower  . Right femoral popliteral bypass  10/24/2008    6-mm Gore Tex Graft  . Thrombectomy      right femoral to above knee popliteral Gore-Tex graft  . Angioplasty      Dacron patch of Gore-Tex and saphenour vein junction and above knee popliteral artery  . Heart bypass     Social History History  Substance Use Topics  . Smoking status: Current Every Day Smoker -- 0.60 packs/day for 55 years  Types: Cigarettes  . Smokeless tobacco: Never Used  . Alcohol Use: No   Family History Family History  Problem Relation Age of Onset  . Other Mother 49    Abdominal Aortic Aneurysm   . Alzheimer's disease Father 51   Allergies  Allergen Reactions  . Aripiprazole     "makes him wild"  . Aspirin     Causes GI Bleed  . Crestor [Rosuvastatin Calcium] Itching  . Effexor [Venlafaxine Hydrochloride] Itching  . Lipitor [Atorvastatin Calcium] Itching  . Morphine And Related Itching  . Zocor [Simvastatin - High Dose] Itching   REVIEW OF SYSTEMS  General: [ ]  Weight loss, [ ]  Fever, [ ]  chills Neurologic: [ ]  Dizziness, [ ]  Blackouts, [ ]  Seizure [ ]  Stroke, [ ]  "Mini stroke", [ ]  Slurred speech, [ ]  Temporary blindness; [ ]  weakness in arms or legs, [ ]  Hoarseness [ ]   Dysphagia Cardiac: [ ]  Chest pain/pressure, [ ]  Shortness of breath at rest [ ]  Shortness of breath with exertion, [ ]  Atrial fibrillation or irregular heartbeat  Vascular: [x ] Pain in legs with walking, [x ] Pain in legs at rest, [x ] Pain in legs at night,  [ ]  Non-healing ulcer, [ ]  Blood clot in vein/DVT,   Pulmonary: [ ]  Home oxygen, [ ]  Productive cough, [ ]  Coughing up blood, [ ]  Asthma,  [ ]  Wheezing [ ]  COPD Musculoskeletal:  [ ]  Arthritis, [ ]  Low back pain, [ ]  Joint pain Hematologic: [ ]  Easy Bruising, [ ]  Anemia; [ ]  Hepatitis Gastrointestinal: [ ]  Blood in stool, [ ]  Gastroesophageal Reflux/heartburn, Urinary: [ ]  chronic Kidney disease, [ ]  on HD - [ ]  MWF or [ ]  TTHS, [ ]  Burning with urination, [ ]  Difficulty urinating Skin: [ ]  Rashes, [ ]  Wounds Psychological: [ ]  Anxiety, [ ]  Depression  Physical Examination Filed Vitals:   01/06/14 0847  BP: 153/73  Temp: 97.8 F (36.6 C)  TempSrc: Oral  Resp: 22  Height: 6' (1.829 m)  Weight: 212 lb (96.163 kg)  SpO2: 98%   Body mass index is 28.75 kg/(m^2).  General:  WDWN in NAD HENT: WNL Eyes: Pupils equal Pulmonary: normal non-labored breathing , without Rales, rhonchi,  wheezing Cardiac: RRR, without  Murmurs, rubs or gallops; No carotid bruits Abdomen: soft, NT, no masses Skin: no rashes, ulcers noted;  no Gangrene , no cellulitis; no open wounds;   Vascular Exam/Pulses:Palpable right femoral pulse 2+, non palpable left femoral pulses.  Non palpable popliteal pulses bil.No doppler signals right foot, left PT doppler signal.    Musculoskeletal: no muscle wasting or atrophy; no edema  Neurologic: A&O X 3; Appropriate Affect ;  SENSATION: normal; MOTOR FUNCTION: 5/5 Symmetric Speech is fluent/normal  Significant Diagnostic Studies: CBC Lab Results  Component Value Date   WBC 11.2* 06/27/2011   HGB 15.4 06/27/2011   HCT 44.6 06/27/2011   MCV 92.9 06/27/2011   PLT 179 06/27/2011   BMET    Component Value  Date/Time   NA 135 06/27/2011 1306   K 3.9 06/27/2011 1306   CL 99 06/27/2011 1306   CO2 24 06/27/2011 1306   GLUCOSE 137* 06/27/2011 1306   BUN 28* 06/27/2011 1306   CREATININE 0.93 06/27/2011 1306   CALCIUM 9.3 06/27/2011 1306   GFRNONAA 84* 06/27/2011 1306   GFRAA >90 06/27/2011 1306   Estimated Creatinine Clearance: 88.9 ml/min (by C-G formula based on Cr of 0.93).  COAG Lab Results  Component Value  Date   INR 0.94 10/05/2010   INR 0.99 02/23/2010   INR 1.0 10/21/2008   Non-Invasive Vascular Imaging:  Pending EKG and ABI's  ASSESSMENT/PLAN:  Occlusion right Fem-pop graft. We will ask for IR consult to consider thrombollysis His symptoms started at 4 am and have progressed.    He last ate at 4:30 am.  Laurence Slate [facility] 01/06/2014 9:56 AM  IR called for thrombolysis for acutely occluded right lower extremity bypass graft.  Spoke with Dr. Jeronimo Norma.  Leontine Locket 01/06/2014 11:46 AM  Agree with above. He appears to be occluded his right femoropopliteal bypass graft. The foot does look viable. He does continue to smoke tobacco. He has had multiple procedures on his right lower extremity. 1. His original bypass was in April 2010. He had had vein harvested from the right lower extremity for CABG. Therefore, Dr. Amedeo Plenty performed a right femoral to above-knee popliteal artery with 6 mm PTFE. 2. He subsequently had revision of his graft by Dr. Donnetta Hutching on 03/02/2010. He did and a right above-knee popliteal artery bypass to below knee popliteal artery bypass with reverse vein graft. Vein was harvested from the right leg for this. The proximal graft was sewn to the distal aspect of the Gortex femoropopliteal graft. 3. On 10/06/2010, this graft occluded and Dr. Kellie Simmering performed thrombectomy of the right femoral to above-knee popliteal artery bypass graft, Dacron patch angioplasty of the Gortex and saphenous vein junction. Intraoperative arteriogram at that time revealed that the vein graft  which was anastomosed to the below knee popliteal artery was patent but there was total occlusion of the popliteal artery below that with the tibials filling via collaterals. Based on the previous arteriogram the dominant runoff was the peroneal artery previously and then reconstitution of the posterior tibial artery distally. 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  I think the best option to try to salvage the graft would be attempted thrombolysis. He has been evaluated by interventional radiology and felt to be a reasonable candidate for this. If this is not successful, then consideration could be given to evaluating him for a redo femoral tibial bypass using vein from the left leg. The risk of attempting surgical thrombectomy would be that we would interrupt significant collaterals in order to gain access to the graft above-the-knee and below the knee and if the graft reoccluded I think he would be in a lot worse shape then he is currently is currently the foot does appear viable.   Deitra Mayo, MD, [place] [phone] 01/06/2014

## 2014-01-07 LAB — PROTIME-INR
INR: 1.01 (ref 0.00–1.49)
Prothrombin Time: 13.3 seconds (ref 11.6–15.2)

## 2014-01-07 LAB — CBC
HCT: 43.5 % (ref 39.0–52.0)
Hemoglobin: 14.6 g/dL (ref 13.0–17.0)
MCH: 32.1 pg (ref 26.0–34.0)
MCHC: 33.6 g/dL (ref 30.0–36.0)
MCV: 95.6 fL (ref 78.0–100.0)
Platelets: 147 10*3/uL — ABNORMAL LOW (ref 150–400)
RBC: 4.55 MIL/uL (ref 4.22–5.81)
RDW: 13.9 % (ref 11.5–15.5)
WBC: 7.2 10*3/uL (ref 4.0–10.5)

## 2014-01-07 LAB — HEPARIN LEVEL (UNFRACTIONATED): Heparin Unfractionated: 0.27 IU/mL — ABNORMAL LOW (ref 0.30–0.70)

## 2014-01-07 LAB — POCT ACTIVATED CLOTTING TIME: Activated Clotting Time: 135 seconds

## 2014-01-07 MED ORDER — DULOXETINE HCL 60 MG PO CPEP
60.0000 mg | ORAL_CAPSULE | Freq: Every day | ORAL | Status: DC
  Administered 2014-01-07 – 2014-01-08 (×2): 60 mg via ORAL
  Filled 2014-01-07 (×2): qty 1

## 2014-01-07 MED ORDER — MOMETASONE FURO-FORMOTEROL FUM 100-5 MCG/ACT IN AERO
2.0000 | INHALATION_SPRAY | Freq: Two times a day (BID) | RESPIRATORY_TRACT | Status: DC
  Administered 2014-01-07 – 2014-01-08 (×3): 2 via RESPIRATORY_TRACT
  Filled 2014-01-07: qty 8.8

## 2014-01-07 MED ORDER — PRAZOSIN HCL 1 MG PO CAPS
1.0000 mg | ORAL_CAPSULE | Freq: Every day | ORAL | Status: DC
  Administered 2014-01-07: 1 mg via ORAL
  Filled 2014-01-07 (×2): qty 1

## 2014-01-07 MED ORDER — QUETIAPINE FUMARATE ER 300 MG PO TB24
300.0000 mg | ORAL_TABLET | Freq: Every day | ORAL | Status: DC
  Administered 2014-01-07: 300 mg via ORAL
  Filled 2014-01-07 (×2): qty 1

## 2014-01-07 MED ORDER — ONDANSETRON HCL 4 MG/2ML IJ SOLN
4.0000 mg | Freq: Four times a day (QID) | INTRAMUSCULAR | Status: DC | PRN
  Administered 2014-01-07 (×2): 4 mg via INTRAVENOUS
  Filled 2014-01-07 (×2): qty 2

## 2014-01-07 MED ORDER — TRAZODONE HCL 150 MG PO TABS
150.0000 mg | ORAL_TABLET | Freq: Every day | ORAL | Status: DC
  Administered 2014-01-07: 150 mg via ORAL
  Filled 2014-01-07 (×2): qty 1
  Filled 2014-01-07: qty 3

## 2014-01-07 MED ORDER — RAMIPRIL 10 MG PO CAPS
10.0000 mg | ORAL_CAPSULE | Freq: Every day | ORAL | Status: DC
  Administered 2014-01-07 – 2014-01-08 (×2): 10 mg via ORAL
  Filled 2014-01-07 (×2): qty 1

## 2014-01-07 MED ORDER — ALBUTEROL SULFATE (2.5 MG/3ML) 0.083% IN NEBU: 2.5000 mg | INHALATION_SOLUTION | Freq: Four times a day (QID) | RESPIRATORY_TRACT | Status: DC | PRN

## 2014-01-07 MED ORDER — CLONAZEPAM 1 MG PO TABS
1.0000 mg | ORAL_TABLET | Freq: Two times a day (BID) | ORAL | Status: DC | PRN
  Administered 2014-01-08: 1 mg via ORAL
  Filled 2014-01-07: qty 1

## 2014-01-07 MED ORDER — ALBUTEROL SULFATE (5 MG/ML) 0.5% IN NEBU: 2.5000 mg | INHALATION_SOLUTION | Freq: Four times a day (QID) | RESPIRATORY_TRACT | Status: DC | PRN

## 2014-01-07 MED ORDER — ZOLPIDEM TARTRATE 5 MG PO TABS
5.0000 mg | ORAL_TABLET | Freq: Every evening | ORAL | Status: DC | PRN
  Administered 2014-01-07: 5 mg via ORAL
  Filled 2014-01-07: qty 1

## 2014-01-07 MED ORDER — TIOTROPIUM BROMIDE MONOHYDRATE 18 MCG IN CAPS
18.0000 ug | ORAL_CAPSULE | Freq: Every day | RESPIRATORY_TRACT | Status: DC
  Administered 2014-01-07 – 2014-01-08 (×2): 18 ug via RESPIRATORY_TRACT
  Filled 2014-01-07: qty 5

## 2014-01-07 NOTE — Progress Notes (Signed)
ANTICOAGULATION CONSULT NOTE - Follow Up Consult  Pharmacy Consult for heparin Indication: right LE ischemia  Labs:  Recent Labs  01/06/14 0954 01/06/14 1211 01/07/14 0452  HGB 14.8  --  14.6  HCT 43.1  --  43.5  PLT 142*  --  147*  APTT  --  31  --   LABPROT  --  13.3 13.3  INR  --  1.01 1.01  HEPARINUNFRC  --   --  0.27*  CREATININE 0.99  --   --     Assessment: 73yo male slightly subtherapeutic on heparin with initial dosing for ischemic leg.  Goal of Therapy:  Heparin level 0.3-0.7 units/ml   Plan:  Will increase heparin gtt by 1 unit/kg/hr to 1500 units/hr and check level in 6hr.  Wynona Neat, PharmD, BCPS  01/07/2014,6:26 AM

## 2014-01-07 NOTE — Progress Notes (Addendum)
   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  Will need R femoral endarterectomy and profundaplasty minimally with possible femoral to PT bypass  Dr. Scot Dock has offered the patient procedure pending vein mapping  Pt with intact motor and sensation in R foot with signals  Will get ABI to set baseline of perfusion  Heparin drip without any value in this patient, will discontinue  Subjective    R calf pain unchanged from yesterday  Objective Filed Vitals:   01/06/14 2145 01/06/14 2245 01/07/14 0604 01/07/14 0825  BP: 121/74 120/80 131/86 156/87  Pulse: 77 85 83 82  Temp: 97.8 F (36.6 C) 98.2 F (36.8 C) 97.8 F (36.6 C) 98.7 F (37.1 C)  TempSrc: Oral Oral Oral Oral  Resp: 16 16 16 22   Height:      Weight:      SpO2: 94% 92% 92% 91%    Intake/Output Summary (Last 24 hours) at 01/07/14 1059 Last data filed at 01/07/14 0830  Gross per 24 hour  Intake    929 ml  Output    760 ml  Net    169 ml    PULM  CTAB CV  RRR GI  soft, NTND VASC  Both feet viable, R foot with intact motor and sensation, no palpable pedal pulses  Laboratory CBC    Component Value Date/Time   WBC 7.2 01/07/2014 0452   HGB 14.6 01/07/2014 0452   HCT 43.5 01/07/2014 0452   PLT 147* 01/07/2014 0452    BMET    Component Value Date/Time   NA 138 01/06/2014 0954   K 4.6 01/06/2014 0954   CL 100 01/06/2014 0954   CO2 22 01/06/2014 0954   GLUCOSE 107* 01/06/2014 0954   BUN 16 01/06/2014 0954   CREATININE 0.99 01/06/2014 0954   CALCIUM 9.2 01/06/2014 0954   GFRNONAA 81* 01/06/2014 0954   GFRAA >90 01/06/2014 El Brazil, MD [facility] [place] Office: [phone] Pager: [phone]  01/07/2014, 10:59 AM

## 2014-01-07 NOTE — Progress Notes (Signed)
UR completed.  Kamaljit Hizer, RN BSN [facility] CCM Trauma/Neuro ICU Case Manager [phone]  

## 2014-01-08 DIAGNOSIS — Z01810 Encounter for preprocedural cardiovascular examination: Secondary | ICD-10-CM | POA: Diagnosis not present

## 2014-01-08 DIAGNOSIS — I739 Peripheral vascular disease, unspecified: Secondary | ICD-10-CM | POA: Diagnosis not present

## 2014-01-08 LAB — CBC
HEMATOCRIT: 41.4 % (ref 39.0–52.0)
Hemoglobin: 13.6 g/dL (ref 13.0–17.0)
MCH: 31.4 pg (ref 26.0–34.0)
MCHC: 32.9 g/dL (ref 30.0–36.0)
MCV: 95.6 fL (ref 78.0–100.0)
Platelets: 132 10*3/uL — ABNORMAL LOW (ref 150–400)
RBC: 4.33 MIL/uL (ref 4.22–5.81)
RDW: 13.4 % (ref 11.5–15.5)
WBC: 6.4 10*3/uL (ref 4.0–10.5)

## 2014-01-08 MED ORDER — HYDROMORPHONE HCL 2 MG PO TABS: 1.0000 mg | ORAL_TABLET | Freq: Four times a day (QID) | ORAL | Status: DC | PRN

## 2014-01-08 MED ORDER — HYDROCODONE-ACETAMINOPHEN 5-325 MG PO TABS: 1.0000 | ORAL_TABLET | ORAL | Status: DC | PRN

## 2014-01-08 NOTE — Progress Notes (Addendum)
  [facility] Progress Note  01/08/2014 9:56 AM  Subjective:  Patient says he is feeling better today but still having pain in right calf.    Filed Vitals:   01/08/14 0632  BP: 110/67  Pulse: 71  Temp: 97.9 F (36.6 C)  Resp: 19    Physical Exam: Extremities:  Unchanged from yesterday. Right calf pain.  Right foot with intact motor and sensation. Non-palpable pedal pulses. Both feet are viable.   CBC    Component Value Date/Time   WBC 6.4 01/08/2014 0408   RBC 4.33 01/08/2014 0408   HGB 13.6 01/08/2014 0408   HCT 41.4 01/08/2014 0408   PLT 132* 01/08/2014 0408   MCV 95.6 01/08/2014 0408   MCH 31.4 01/08/2014 0408   MCHC 32.9 01/08/2014 0408   RDW 13.4 01/08/2014 0408   LYMPHSABS 1.8 01/06/2014 0954   MONOABS 0.3 01/06/2014 0954   EOSABS 0.2 01/06/2014 0954   BASOSABS 0.0 01/06/2014 0954    BMET    Component Value Date/Time   NA 138 01/06/2014 0954   K 4.6 01/06/2014 0954   CL 100 01/06/2014 0954   CO2 22 01/06/2014 0954   GLUCOSE 107* 01/06/2014 0954   BUN 16 01/06/2014 0954   CREATININE 0.99 01/06/2014 0954   CALCIUM 9.2 01/06/2014 0954   GFRNONAA 81* 01/06/2014 0954   GFRAA >90 01/06/2014 0954    INR    Component Value Date/Time   INR 1.01 01/07/2014 0452     Intake/Output Summary (Last 24 hours) at 01/08/14 0956 Last data filed at 01/08/14 8786  Gross per 24 hour  Intake   1540 ml  Output   1000 ml  Net    540 ml     Assessment:  73 y.o. 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 -Will need R femoral endarterectomy and profundaplasty minimally with possible femoral to PT bypass -Vein mapping of left leg to be completed today. -Home meds restarted yesterday. Patient says his pain is better. Motor and sensation intact. Non-palpable pulses. -Will discharge home today after vein mapping is completed. He will follow-up with Dr. Scot Dock.    Virgina Jock, PA-C [facility] Office:  [phone] Pager: [phone] 01/08/2014 9:56 AM  Addendum  I have independently interviewed and examined the patient, and I agree with the physician assistant's findings.  Viable R foot without any evidence of threatened limb.  Pt will be elective scheduled for a a R femoral endarterectomy with profundaplasty +/- Fem-PT bypass (dependent on available conduit) in L leg.  Adele Barthel, MD [facility] [place] Office: [phone] Pager: [phone]  01/08/2014, 10:17 AM

## 2014-01-08 NOTE — Discharge Summary (Signed)
[facility] Discharge Summary  John Ball [birth year] 73 y.o. 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 male known to our practice. His chief complaint was painful leg and foot that has progressed since that morning at 4 am. He states his foot feels cold. He had initially had a right femoral to above-knee popliteal bypass in April 2010. This was followed by right above-knee to below-knee popliteal bypass in August of 2011. He presented with occlusion of this and underwent thrombectomy and revision of his femoral-popliteal bypass in April of 2012. He has also had coronary by-pass in 2004. His past medical history includes: Hypertension treated with Altace, Hyperlipidemia not currently treated with statins. He is allergic to statins and Asprin. He is not on any antiplatelets or anticoagulants. He is a chronic tobacco abuser. He has had ABI's at the [place] within the last 6 months and was told his studies were normal.  We have been consulted for symptoms of claudication with rest.  Hospital Course:  The patient was admitted to the hospital and attempted to undergo thrombolysis by interventional radiology but was unsuccessful. His bypass graft was unable to be cannulated. He had extensive collaterals suggesting possible chronic occlusion of his graft. Dr. Scot Dock discussed alternatively undergoing thrombectomy but it was not clear whether his graft was chronically or acutely occluded. He thought a new femoral to posterior tibial artery bypass may be a better option. He was admitted to the floor and started on IV heparin. Vein mapping of his left leg was ordered. He had previous vein harvest of his left for CABG.   On the second admission day, he still complained of pain in his  right calf with no palpable pulses.   On the third admission day, his pain was slightly improved. Vein mapping was completed. He was discharged home with adequate pain medication. He was told to follow-up as outpatient to discuss the elective procedure. He had intact motor and sensation in his right foot. He was discharged home in stable condition.   He will follow-up in the [facility] office in one week.   CBC    Component Value Date/Time   WBC 6.4 01/08/2014 0408   RBC 4.33 01/08/2014 0408   HGB 13.6 01/08/2014 0408   HCT 41.4 01/08/2014 0408   PLT 132* 01/08/2014 0408   MCV 95.6 01/08/2014 0408   MCH 31.4 01/08/2014 0408   MCHC 32.9 01/08/2014 0408   RDW 13.4 01/08/2014 0408   LYMPHSABS 1.8 01/06/2014 0954   MONOABS 0.3 01/06/2014 0954   EOSABS 0.2 01/06/2014 0954   BASOSABS 0.0 01/06/2014 0954    BMET    Component Value Date/Time   NA 138 01/06/2014 0954   K 4.6 01/06/2014 0954   CL 100 01/06/2014 0954   CO2 22 01/06/2014 0954   GLUCOSE 107* 01/06/2014 0954   BUN 16 01/06/2014 0954   CREATININE 0.99 01/06/2014 0954   CALCIUM 9.2 01/06/2014 0954   GFRNONAA 81* 01/06/2014 0954   GFRAA >90 01/06/2014 0954     Discharge Instructions:   The patient is discharged to home with extensive instructions on wound care and progressive ambulation.  They are instructed not to drive or perform any heavy lifting until returning to see the physician in his office.  Discharge Instructions   Call MD for:  redness, tenderness, or signs of infection (pain, swelling, bleeding, redness, odor or green/yellow discharge around incision site)    Complete by:  As directed      Call MD for:  severe or increased pain, loss or decreased feeling  in affected limb(s)    Complete by:  As directed      Call MD for:  temperature >100.5    Complete by:  As directed      Driving Restrictions    Complete by:  As directed   NO DRIVING WHILE ON PAIN MEDICATION.     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 Arterial occlusion 01/06/2014  . Lung cancer 11/02/2013  . PAD (peripheral artery disease) 08/12/2012   Past Medical History  Diagnosis Date  . Hypertension   . Hyperlipidemia   . COPD (chronic obstructive pulmonary disease)   . Depression   . Pain     legs and feet  . Coronary heart disease   . Lung nodules   . Cancer Oct 2012    Lung       Medication List         acetaminophen 500 MG tablet  Commonly known as:  TYLENOL  Take 500 mg by mouth every 6 (six) hours as needed for headache (pain).     ADVAIR DISKUS 250-50 MCG/DOSE Aepb  Generic drug:  Fluticasone-Salmeterol  Inhale 1 puff into the lungs every 12 (twelve) hours.     albuterol (5 MG/ML) 0.5% nebulizer solution  Commonly known as:  PROVENTIL  Take 2.5 mg by nebulization every 6 (six) hours as needed for wheezing or shortness of breath.     AMBIEN CR 12.5 MG CR tablet  Generic drug:  zolpidem  Take 12.5 mg by mouth at bedtime.     clonazePAM 1 MG tablet  Commonly known as:  KLONOPIN  Take 1 mg by mouth 2 (two) times daily as needed for anxiety.     DULoxetine 60 MG capsule  Commonly known as:  CYMBALTA  Take 60 mg by mouth daily.     HYDROcodone-acetaminophen 5-325 MG per tablet  Commonly known as:  NORCO/VICODIN  Take 1-2 tablets by mouth every 4 (four) hours as needed for moderate pain.     HYDROmorphone 2 MG tablet  Commonly known as:  DILAUDID  Take 0.5 tablets (1 mg total) by mouth every 6 (six) hours as needed for severe pain (for breakthrough pain).     prazosin 1 MG capsule  Commonly known as:  MINIPRESS  Take 1 mg by mouth at bedtime.      QUEtiapine 300 MG 24 hr tablet  Commonly known as:  SEROQUEL XR  Take 300 mg by mouth at bedtime.     ramipril 10 MG capsule  Commonly known as:  ALTACE  Take 10 mg by mouth daily.     tiotropium 18 MCG inhalation capsule  Commonly known as:  SPIRIVA  Place 18 mcg into inhaler and inhale daily.  traZODone 150 MG tablet  Commonly known as:  DESYREL  Take 150 mg by mouth at bedtime.        Prescriptions: Vicodin  #50 No Refill; Dilaudid 2 mg # 20   Disposition: Home  Patient's condition: is stable.   Follow up: 1. Dr. Scot Dock in 1 week.    Virgina Jock, PA-C [facility] [phone] 01/08/2014  1:42 PM

## 2014-01-08 NOTE — Progress Notes (Signed)
Left Lower Extremity Vein Map    Left Great Saphenous Vein   Segment Diameter Comment  1. Origin 6.78mm   2. High Thigh 5.30mm Branch  3. Mid Thigh 4.48mm Branch  4. Low Thigh 3.63mm Branch  5. At Knee 3.75mm Branch  6. High Calf 2.24mm Branch  7. Low Calf 2.15mm Branch  8. Ankle 3.28mm Branch   mm    mm    mm

## 2014-01-10 ENCOUNTER — Emergency Department (HOSPITAL_COMMUNITY): Payer: Medicare Other | Admitting: Anesthesiology

## 2014-01-10 ENCOUNTER — Encounter (HOSPITAL_COMMUNITY)
Admission: EM | Disposition: A | Discharge status: Home / Self Care | Payer: Self-pay | Source: Home / Self Care | Attending: Vascular Surgery

## 2014-01-10 ENCOUNTER — Inpatient Hospital Stay (HOSPITAL_COMMUNITY)
Admission: EM | Admit: 2014-01-10 | Discharge: 2014-01-12 | DRG: 253 | Disposition: A | Discharge status: Home / Self Care | Payer: Medicare Other | Attending: Vascular Surgery | Admitting: Vascular Surgery

## 2014-01-10 ENCOUNTER — Encounter (HOSPITAL_COMMUNITY): Payer: Medicare Other | Admitting: Anesthesiology

## 2014-01-10 ENCOUNTER — Encounter (HOSPITAL_COMMUNITY): Payer: Self-pay | Admitting: Emergency Medicine

## 2014-01-10 ENCOUNTER — Telehealth: Payer: Self-pay

## 2014-01-10 DIAGNOSIS — I7092 Chronic total occlusion of artery of the extremities: Secondary | ICD-10-CM | POA: Diagnosis present

## 2014-01-10 DIAGNOSIS — J449 Chronic obstructive pulmonary disease, unspecified: Secondary | ICD-10-CM | POA: Diagnosis not present

## 2014-01-10 DIAGNOSIS — Z888 Allergy status to other drugs, medicaments and biological substances status: Secondary | ICD-10-CM | POA: Diagnosis not present

## 2014-01-10 DIAGNOSIS — Z820 Family history of epilepsy and other diseases of the nervous system: Secondary | ICD-10-CM

## 2014-01-10 DIAGNOSIS — Z951 Presence of aortocoronary bypass graft: Secondary | ICD-10-CM

## 2014-01-10 DIAGNOSIS — Z886 Allergy status to analgesic agent status: Secondary | ICD-10-CM | POA: Diagnosis not present

## 2014-01-10 DIAGNOSIS — Z86718 Personal history of other venous thrombosis and embolism: Secondary | ICD-10-CM | POA: Diagnosis not present

## 2014-01-10 DIAGNOSIS — I999 Unspecified disorder of circulatory system: Secondary | ICD-10-CM | POA: Diagnosis not present

## 2014-01-10 DIAGNOSIS — Z885 Allergy status to narcotic agent status: Secondary | ICD-10-CM | POA: Diagnosis not present

## 2014-01-10 DIAGNOSIS — I998 Other disorder of circulatory system: Secondary | ICD-10-CM | POA: Diagnosis not present

## 2014-01-10 DIAGNOSIS — F329 Major depressive disorder, single episode, unspecified: Secondary | ICD-10-CM | POA: Diagnosis present

## 2014-01-10 DIAGNOSIS — M79609 Pain in unspecified limb: Secondary | ICD-10-CM | POA: Diagnosis not present

## 2014-01-10 DIAGNOSIS — F3289 Other specified depressive episodes: Secondary | ICD-10-CM | POA: Diagnosis present

## 2014-01-10 DIAGNOSIS — Z85118 Personal history of other malignant neoplasm of bronchus and lung: Secondary | ICD-10-CM

## 2014-01-10 DIAGNOSIS — Z79899 Other long term (current) drug therapy: Secondary | ICD-10-CM | POA: Diagnosis not present

## 2014-01-10 DIAGNOSIS — E785 Hyperlipidemia, unspecified: Secondary | ICD-10-CM | POA: Diagnosis present

## 2014-01-10 DIAGNOSIS — I70409 Unspecified atherosclerosis of autologous vein bypass graft(s) of the extremities, unspecified extremity: Secondary | ICD-10-CM | POA: Diagnosis not present

## 2014-01-10 DIAGNOSIS — F17200 Nicotine dependence, unspecified, uncomplicated: Secondary | ICD-10-CM | POA: Diagnosis present

## 2014-01-10 DIAGNOSIS — I743 Embolism and thrombosis of arteries of the lower extremities: Secondary | ICD-10-CM | POA: Diagnosis not present

## 2014-01-10 DIAGNOSIS — J4489 Other specified chronic obstructive pulmonary disease: Secondary | ICD-10-CM | POA: Diagnosis not present

## 2014-01-10 DIAGNOSIS — I7090 Unspecified atherosclerosis: Secondary | ICD-10-CM

## 2014-01-10 DIAGNOSIS — T82898A Other specified complication of vascular prosthetic devices, implants and grafts, initial encounter: Secondary | ICD-10-CM | POA: Diagnosis not present

## 2014-01-10 DIAGNOSIS — I10 Essential (primary) hypertension: Secondary | ICD-10-CM | POA: Diagnosis not present

## 2014-01-10 DIAGNOSIS — I739 Peripheral vascular disease, unspecified: Secondary | ICD-10-CM | POA: Diagnosis not present

## 2014-01-10 HISTORY — PX: FEMORAL-TIBIAL BYPASS GRAFT: SHX938

## 2014-01-10 LAB — COMPREHENSIVE METABOLIC PANEL
ALT: 15 U/L (ref 0–53)
AST: 17 U/L (ref 0–37)
Albumin: 4 g/dL (ref 3.5–5.2)
Alkaline Phosphatase: 66 U/L (ref 39–117)
Anion gap: 14 (ref 5–15)
BUN: 15 mg/dL (ref 6–23)
CHLORIDE: 101 meq/L (ref 96–112)
CO2: 24 mEq/L (ref 19–32)
CREATININE: 0.94 mg/dL (ref 0.50–1.35)
Calcium: 9 mg/dL (ref 8.4–10.5)
GFR calc Af Amer: >90 mL/min (ref >90)
GFR, EST NON AFRICAN AMERICAN: 83 mL/min — AB (ref >90)
Glucose, Bld: 93 mg/dL (ref 70–99)
Potassium: 4 mEq/L (ref 3.7–5.3)
Sodium: 139 mEq/L (ref 137–147)
Total Bilirubin: <0.2 mg/dL — ABNORMAL LOW (ref 0.3–1.2)
Total Protein: 7.1 g/dL (ref 6.0–8.3)

## 2014-01-10 LAB — GLUCOSE, CAPILLARY: GLUCOSE-CAPILLARY: 102 mg/dL — AB (ref 70–99)

## 2014-01-10 LAB — CBC
HCT: 45.6 % (ref 39.0–52.0)
Hemoglobin: 15 g/dL (ref 13.0–17.0)
MCH: 31.8 pg (ref 26.0–34.0)
MCHC: 32.9 g/dL (ref 30.0–36.0)
MCV: 96.6 fL (ref 78.0–100.0)
PLATELETS: 140 10*3/uL — AB (ref 150–400)
RBC: 4.72 MIL/uL (ref 4.22–5.81)
RDW: 13.6 % (ref 11.5–15.5)
WBC: 5.6 10*3/uL (ref 4.0–10.5)

## 2014-01-10 SURGERY — CREATION, BYPASS, ARTERIAL, FEMORAL TO TIBIAL, USING GRAFT
Anesthesia: General | Laterality: Right

## 2014-01-10 SURGERY — CREATION, BYPASS, ARTERIAL, FEMORAL TO TIBIAL, USING GRAFT
Anesthesia: General | Site: Leg Upper | Laterality: Right

## 2014-01-10 MED ORDER — OXYCODONE HCL 5 MG/5ML PO SOLN: 5.0000 mg | Freq: Once | ORAL | Status: DC | PRN

## 2014-01-10 MED ORDER — LACTATED RINGERS IV SOLN
INTRAVENOUS | Status: DC | PRN
  Administered 2014-01-10 (×3): via INTRAVENOUS

## 2014-01-10 MED ORDER — GLYCOPYRROLATE 0.2 MG/ML IJ SOLN
INTRAMUSCULAR | Status: AC
  Filled 2014-01-10: qty 4

## 2014-01-10 MED ORDER — GLYCOPYRROLATE 0.2 MG/ML IJ SOLN
INTRAMUSCULAR | Status: DC | PRN
  Administered 2014-01-10: 0.2 mg via INTRAVENOUS
  Administered 2014-01-10: .7 mg via INTRAVENOUS

## 2014-01-10 MED ORDER — ALUM & MAG HYDROXIDE-SIMETH 200-200-20 MG/5ML PO SUSP: 15.0000 mL | ORAL | Status: DC | PRN

## 2014-01-10 MED ORDER — PROPOFOL 10 MG/ML IV BOLUS
INTRAVENOUS | Status: DC | PRN
  Administered 2014-01-10: 160 mg via INTRAVENOUS

## 2014-01-10 MED ORDER — QUETIAPINE FUMARATE ER 300 MG PO TB24
300.0000 mg | ORAL_TABLET | Freq: Every day | ORAL | Status: DC
  Administered 2014-01-10 – 2014-01-11 (×2): 300 mg via ORAL
  Filled 2014-01-10 (×3): qty 1

## 2014-01-10 MED ORDER — METOPROLOL TARTRATE 1 MG/ML IV SOLN: 2.0000 mg | INTRAVENOUS | Status: DC | PRN

## 2014-01-10 MED ORDER — ACETAMINOPHEN 650 MG RE SUPP: 325.0000 mg | RECTAL | Status: DC | PRN

## 2014-01-10 MED ORDER — ARTIFICIAL TEARS OP OINT
TOPICAL_OINTMENT | OPHTHALMIC | Status: AC
  Filled 2014-01-10: qty 3.5

## 2014-01-10 MED ORDER — DEXTROSE 5 % IV SOLN
1.5000 g | Freq: Two times a day (BID) | INTRAVENOUS | Status: AC
  Administered 2014-01-10 – 2014-01-11 (×2): 1.5 g via INTRAVENOUS
  Filled 2014-01-10 (×2): qty 1.5

## 2014-01-10 MED ORDER — SODIUM CHLORIDE 0.9 % IV SOLN
INTRAVENOUS | Status: DC
Start: 2014-01-10 — End: 2014-01-12
  Administered 2014-01-10: 22:00:00 via INTRAVENOUS

## 2014-01-10 MED ORDER — HYDROMORPHONE HCL PF 1 MG/ML IJ SOLN
1.0000 mg | Freq: Once | INTRAMUSCULAR | Status: AC
  Administered 2014-01-10: 1 mg via INTRAVENOUS

## 2014-01-10 MED ORDER — ONDANSETRON HCL 4 MG/2ML IJ SOLN: 4.0000 mg | Freq: Four times a day (QID) | INTRAMUSCULAR | Status: DC | PRN

## 2014-01-10 MED ORDER — ROCURONIUM BROMIDE 100 MG/10ML IV SOLN
INTRAVENOUS | Status: DC | PRN
  Administered 2014-01-10 (×2): 10 mg via INTRAVENOUS
  Administered 2014-01-10: 50 mg via INTRAVENOUS
  Administered 2014-01-10 (×3): 10 mg via INTRAVENOUS

## 2014-01-10 MED ORDER — NEOSTIGMINE METHYLSULFATE 10 MG/10ML IV SOLN
INTRAVENOUS | Status: AC
  Filled 2014-01-10: qty 1

## 2014-01-10 MED ORDER — HYDROMORPHONE HCL PF 1 MG/ML IJ SOLN: 1.0000 mg | Freq: Once | INTRAMUSCULAR | Status: DC

## 2014-01-10 MED ORDER — TIOTROPIUM BROMIDE MONOHYDRATE 18 MCG IN CAPS
18.0000 ug | ORAL_CAPSULE | Freq: Every day | RESPIRATORY_TRACT | Status: DC
  Administered 2014-01-11: 18 ug via RESPIRATORY_TRACT
  Filled 2014-01-10: qty 5

## 2014-01-10 MED ORDER — OXYCODONE HCL 5 MG PO TABS: 5.0000 mg | ORAL_TABLET | Freq: Once | ORAL | Status: DC | PRN

## 2014-01-10 MED ORDER — ZOLPIDEM TARTRATE 5 MG PO TABS
5.0000 mg | ORAL_TABLET | Freq: Every evening | ORAL | Status: DC | PRN
  Administered 2014-01-11: 5 mg via ORAL
  Filled 2014-01-10: qty 1

## 2014-01-10 MED ORDER — HEPARIN SODIUM (PORCINE) 1000 UNIT/ML IJ SOLN
INTRAMUSCULAR | Status: AC
  Filled 2014-01-10: qty 1

## 2014-01-10 MED ORDER — SODIUM CHLORIDE 0.9 % IR SOLN
Status: DC | PRN
  Administered 2014-01-10: 15:00:00

## 2014-01-10 MED ORDER — ONDANSETRON HCL 4 MG/2ML IJ SOLN
INTRAMUSCULAR | Status: AC
  Filled 2014-01-10: qty 2

## 2014-01-10 MED ORDER — PRAZOSIN HCL 1 MG PO CAPS
1.0000 mg | ORAL_CAPSULE | Freq: Every day | ORAL | Status: DC
  Administered 2014-01-10 – 2014-01-11 (×2): 1 mg via ORAL
  Filled 2014-01-10 (×3): qty 1

## 2014-01-10 MED ORDER — ACETAMINOPHEN 325 MG PO TABS: 325.0000 mg | ORAL_TABLET | ORAL | Status: DC | PRN

## 2014-01-10 MED ORDER — SUCCINYLCHOLINE CHLORIDE 20 MG/ML IJ SOLN
INTRAMUSCULAR | Status: DC | PRN
  Administered 2014-01-10: 140 mg via INTRAVENOUS

## 2014-01-10 MED ORDER — DULOXETINE HCL 60 MG PO CPEP
60.0000 mg | ORAL_CAPSULE | Freq: Every day | ORAL | Status: DC
  Administered 2014-01-11 – 2014-01-12 (×2): 60 mg via ORAL
  Filled 2014-01-10 (×2): qty 1

## 2014-01-10 MED ORDER — MIDAZOLAM HCL 5 MG/5ML IJ SOLN
INTRAMUSCULAR | Status: DC | PRN
  Administered 2014-01-10 (×2): 1 mg via INTRAVENOUS

## 2014-01-10 MED ORDER — MIDAZOLAM HCL 2 MG/2ML IJ SOLN
INTRAMUSCULAR | Status: AC
  Filled 2014-01-10: qty 2

## 2014-01-10 MED ORDER — 0.9 % SODIUM CHLORIDE (POUR BTL) OPTIME
TOPICAL | Status: DC | PRN
  Administered 2014-01-10 (×2): 1000 mL

## 2014-01-10 MED ORDER — ONDANSETRON HCL 4 MG/2ML IJ SOLN
INTRAMUSCULAR | Status: DC | PRN
  Administered 2014-01-10: 4 mg via INTRAVENOUS

## 2014-01-10 MED ORDER — RAMIPRIL 10 MG PO CAPS
10.0000 mg | ORAL_CAPSULE | Freq: Every day | ORAL | Status: DC
  Administered 2014-01-11: 10 mg via ORAL
  Filled 2014-01-10 (×3): qty 1

## 2014-01-10 MED ORDER — LABETALOL HCL 5 MG/ML IV SOLN
10.0000 mg | INTRAVENOUS | Status: DC | PRN
  Filled 2014-01-10: qty 4

## 2014-01-10 MED ORDER — ENOXAPARIN SODIUM 40 MG/0.4ML ~~LOC~~ SOLN
40.0000 mg | SUBCUTANEOUS | Status: DC
  Administered 2014-01-11: 40 mg via SUBCUTANEOUS
  Filled 2014-01-10 (×3): qty 0.4

## 2014-01-10 MED ORDER — FENTANYL CITRATE 0.05 MG/ML IJ SOLN
INTRAMUSCULAR | Status: DC | PRN
  Administered 2014-01-10 (×8): 50 ug via INTRAVENOUS

## 2014-01-10 MED ORDER — HYDRALAZINE HCL 20 MG/ML IJ SOLN: 10.0000 mg | INTRAMUSCULAR | Status: DC | PRN

## 2014-01-10 MED ORDER — HEPARIN SODIUM (PORCINE) 1000 UNIT/ML IJ SOLN
INTRAMUSCULAR | Status: DC | PRN
  Administered 2014-01-10: 9000 [IU] via INTRAVENOUS
  Administered 2014-01-10: 2000 [IU] via INTRAVENOUS

## 2014-01-10 MED ORDER — HYDROCODONE-ACETAMINOPHEN 5-325 MG PO TABS
1.0000 | ORAL_TABLET | ORAL | Status: DC | PRN
Start: 2014-01-10 — End: 2014-01-12
  Administered 2014-01-11: 1 via ORAL
  Administered 2014-01-12 (×2): 2 via ORAL
  Filled 2014-01-10 (×3): qty 2

## 2014-01-10 MED ORDER — CEFAZOLIN SODIUM 1-5 GM-% IV SOLN
1.0000 g | INTRAVENOUS | Status: AC
  Administered 2014-01-10: 1 g via INTRAVENOUS
  Filled 2014-01-10: qty 50

## 2014-01-10 MED ORDER — HYDROMORPHONE HCL PF 1 MG/ML IJ SOLN
INTRAMUSCULAR | Status: AC
  Administered 2014-01-10: 1 mg via INTRAVENOUS
  Filled 2014-01-10: qty 1

## 2014-01-10 MED ORDER — METOCLOPRAMIDE HCL 5 MG/ML IJ SOLN: 10.0000 mg | Freq: Once | INTRAMUSCULAR | Status: DC | PRN

## 2014-01-10 MED ORDER — PROTAMINE SULFATE 10 MG/ML IV SOLN
INTRAVENOUS | Status: DC | PRN
  Administered 2014-01-10 (×5): 10 mg via INTRAVENOUS

## 2014-01-10 MED ORDER — GLYCOPYRROLATE 0.2 MG/ML IJ SOLN
INTRAMUSCULAR | Status: AC
  Filled 2014-01-10: qty 1

## 2014-01-10 MED ORDER — CLONAZEPAM 1 MG PO TABS: 1.0000 mg | ORAL_TABLET | Freq: Two times a day (BID) | ORAL | Status: DC | PRN

## 2014-01-10 MED ORDER — HYDROMORPHONE HCL PF 1 MG/ML IJ SOLN: 0.2500 mg | INTRAMUSCULAR | Status: DC | PRN

## 2014-01-10 MED ORDER — GUAIFENESIN-DM 100-10 MG/5ML PO SYRP: 15.0000 mL | ORAL_SOLUTION | ORAL | Status: DC | PRN

## 2014-01-10 MED ORDER — ROCURONIUM BROMIDE 50 MG/5ML IV SOLN
INTRAVENOUS | Status: AC
  Filled 2014-01-10: qty 1

## 2014-01-10 MED ORDER — MOMETASONE FURO-FORMOTEROL FUM 100-5 MCG/ACT IN AERO
2.0000 | INHALATION_SPRAY | Freq: Two times a day (BID) | RESPIRATORY_TRACT | Status: DC
  Administered 2014-01-11 (×2): 2 via RESPIRATORY_TRACT
  Filled 2014-01-10: qty 8.8

## 2014-01-10 MED ORDER — ALBUTEROL SULFATE (2.5 MG/3ML) 0.083% IN NEBU: 2.5000 mg | INHALATION_SOLUTION | Freq: Four times a day (QID) | RESPIRATORY_TRACT | Status: DC | PRN

## 2014-01-10 MED ORDER — PANTOPRAZOLE SODIUM 40 MG PO TBEC
40.0000 mg | DELAYED_RELEASE_TABLET | Freq: Every day | ORAL | Status: DC
  Administered 2014-01-10 – 2014-01-12 (×3): 40 mg via ORAL
  Filled 2014-01-10 (×3): qty 1

## 2014-01-10 MED ORDER — ARTIFICIAL TEARS OP OINT
TOPICAL_OINTMENT | OPHTHALMIC | Status: DC | PRN
  Administered 2014-01-10: 1 via OPHTHALMIC

## 2014-01-10 MED ORDER — POTASSIUM CHLORIDE CRYS ER 20 MEQ PO TBCR: 20.0000 meq | EXTENDED_RELEASE_TABLET | Freq: Every day | ORAL | Status: DC | PRN

## 2014-01-10 MED ORDER — SODIUM CHLORIDE 0.9 % IV SOLN: 500.0000 mL | Freq: Once | INTRAVENOUS | Status: AC | PRN

## 2014-01-10 MED ORDER — PROTAMINE SULFATE 10 MG/ML IV SOLN
INTRAVENOUS | Status: AC
  Filled 2014-01-10: qty 5

## 2014-01-10 MED ORDER — HYDROMORPHONE HCL PF 1 MG/ML IJ SOLN
0.5000 mg | INTRAMUSCULAR | Status: DC | PRN
  Administered 2014-01-11 (×4): 1 mg via INTRAVENOUS
  Filled 2014-01-10 (×4): qty 1

## 2014-01-10 MED ORDER — LIDOCAINE HCL (CARDIAC) 20 MG/ML IV SOLN
INTRAVENOUS | Status: DC | PRN
  Administered 2014-01-10: 100 mg via INTRAVENOUS

## 2014-01-10 MED ORDER — DOCUSATE SODIUM 100 MG PO CAPS
100.0000 mg | ORAL_CAPSULE | Freq: Every day | ORAL | Status: DC
  Administered 2014-01-11 – 2014-01-12 (×2): 100 mg via ORAL
  Filled 2014-01-10 (×2): qty 1

## 2014-01-10 MED ORDER — LIDOCAINE HCL (CARDIAC) 20 MG/ML IV SOLN
INTRAVENOUS | Status: AC
  Filled 2014-01-10: qty 5

## 2014-01-10 MED ORDER — TRAZODONE HCL 150 MG PO TABS
150.0000 mg | ORAL_TABLET | Freq: Every day | ORAL | Status: DC
  Administered 2014-01-10 – 2014-01-11 (×2): 150 mg via ORAL
  Filled 2014-01-10 (×3): qty 1

## 2014-01-10 MED ORDER — FENTANYL CITRATE 0.05 MG/ML IJ SOLN
INTRAMUSCULAR | Status: AC
  Filled 2014-01-10: qty 5

## 2014-01-10 MED ORDER — PROPOFOL 10 MG/ML IV BOLUS
INTRAVENOUS | Status: AC
  Filled 2014-01-10: qty 20

## 2014-01-10 MED ORDER — NEOSTIGMINE METHYLSULFATE 10 MG/10ML IV SOLN
INTRAVENOUS | Status: DC | PRN
  Administered 2014-01-10: 4 mg via INTRAVENOUS

## 2014-01-10 MED ORDER — PHENOL 1.4 % MT LIQD: 1.0000 | OROMUCOSAL | Status: DC | PRN

## 2014-01-10 SURGICAL SUPPLY — 75 items
APL SKNCLS STERI-STRIP NONHPOA (GAUZE/BANDAGES/DRESSINGS) ×1
BANDAGE ESMARK 6X9 LF (GAUZE/BANDAGES/DRESSINGS) IMPLANT
BENZOIN TINCTURE PRP APPL 2/3 (GAUZE/BANDAGES/DRESSINGS) ×3 IMPLANT
BLADE 10 SAFETY STRL DISP (BLADE) IMPLANT
BLADE SURG ROTATE 9660 (MISCELLANEOUS) ×6 IMPLANT
BNDG CMPR 9X6 STRL LF SNTH (GAUZE/BANDAGES/DRESSINGS)
BNDG ESMARK 6X9 LF (GAUZE/BANDAGES/DRESSINGS)
CANISTER SUCTION 2500CC (MISCELLANEOUS) ×6 IMPLANT
CANNULA VESSEL 3MM 2 BLNT TIP (CANNULA) ×3 IMPLANT
CATH EMB 4FR 80CM (CATHETERS) ×3 IMPLANT
CLIP LIGATING EXTRA MED SLVR (CLIP) ×3 IMPLANT
CLIP LIGATING EXTRA SM BLUE (MISCELLANEOUS) ×3 IMPLANT
CLIP TI MEDIUM 6 (CLIP) ×6 IMPLANT
CLIP TI WIDE RED SMALL 6 (CLIP) ×3 IMPLANT
CLOSURE WOUND 1/2 X4 (GAUZE/BANDAGES/DRESSINGS) ×1
COVER SURGICAL LIGHT HANDLE (MISCELLANEOUS) ×3 IMPLANT
CUFF TOURNIQUET SINGLE 34IN LL (TOURNIQUET CUFF) IMPLANT
CUFF TOURNIQUET SINGLE 44IN (TOURNIQUET CUFF) IMPLANT
DRAIN SNY 10X20 3/4 PERF (WOUND CARE) IMPLANT
DRAPE WARM FLUID 44X44 (DRAPE) ×3 IMPLANT
DRAPE X-RAY CASS 24X20 (DRAPES) IMPLANT
DRSG COVADERM 4X10 (GAUZE/BANDAGES/DRESSINGS) IMPLANT
DRSG COVADERM 4X6 (GAUZE/BANDAGES/DRESSINGS) ×9 IMPLANT
DRSG COVADERM 4X8 (GAUZE/BANDAGES/DRESSINGS) ×6 IMPLANT
ELECT BLADE 6.5 EXT (BLADE) ×3 IMPLANT
ELECT REM PT RETURN 9FT ADLT (ELECTROSURGICAL) ×6
ELECTRODE REM PT RTRN 9FT ADLT (ELECTROSURGICAL) ×2 IMPLANT
EVACUATOR SILICONE 100CC (DRAIN) IMPLANT
GLOVE BIO SURGEON STRL SZ 6.5 (GLOVE) ×10 IMPLANT
GLOVE BIO SURGEON STRL SZ7.5 (GLOVE) ×3 IMPLANT
GLOVE BIO SURGEONS STRL SZ 6.5 (GLOVE) ×5
GLOVE BIOGEL PI IND STRL 6.5 (GLOVE) ×2 IMPLANT
GLOVE BIOGEL PI IND STRL 7.0 (GLOVE) ×1 IMPLANT
GLOVE BIOGEL PI INDICATOR 6.5 (GLOVE) ×4
GLOVE BIOGEL PI INDICATOR 7.0 (GLOVE) ×2
GLOVE SS BIOGEL STRL SZ 7.5 (GLOVE) ×1 IMPLANT
GLOVE SUPERSENSE BIOGEL SZ 7.5 (GLOVE) ×2
GLOVE SURG SS PI 7.0 STRL IVOR (GLOVE) ×6 IMPLANT
GOWN STRL REUS W/ TWL LRG LVL3 (GOWN DISPOSABLE) ×6 IMPLANT
GOWN STRL REUS W/ TWL XL LVL3 (GOWN DISPOSABLE) ×2 IMPLANT
GOWN STRL REUS W/TWL LRG LVL3 (GOWN DISPOSABLE) ×18
GOWN STRL REUS W/TWL XL LVL3 (GOWN DISPOSABLE) ×6
INSERT FOGARTY SM (MISCELLANEOUS) IMPLANT
KIT BASIN OR (CUSTOM PROCEDURE TRAY) ×3 IMPLANT
KIT ROOM TURNOVER OR (KITS) ×3 IMPLANT
NS IRRIG 1000ML POUR BTL (IV SOLUTION) ×6 IMPLANT
PACK PERIPHERAL VASCULAR (CUSTOM PROCEDURE TRAY) ×3 IMPLANT
PAD ARMBOARD 7.5X6 YLW CONV (MISCELLANEOUS) ×6 IMPLANT
PADDING CAST COTTON 6X4 STRL (CAST SUPPLIES) IMPLANT
PENCIL BUTTON HOLSTER BLD 10FT (ELECTRODE) ×3 IMPLANT
SET COLLECT BLD 21X3/4 12 (NEEDLE) IMPLANT
SLEEVE SURGEON STRL (DRAPES) ×3 IMPLANT
SPONGE GAUZE 4X4 12PLY (GAUZE/BANDAGES/DRESSINGS) IMPLANT
STAPLER VISISTAT 35W (STAPLE) IMPLANT
STOPCOCK 4 WAY LG BORE MALE ST (IV SETS) IMPLANT
STRIP CLOSURE SKIN 1/2X4 (GAUZE/BANDAGES/DRESSINGS) ×2 IMPLANT
SUT ETHILON 3 0 PS 1 (SUTURE) IMPLANT
SUT PROLENE 5 0 C 1 24 (SUTURE) ×6 IMPLANT
SUT PROLENE 6 0 CC (SUTURE) ×18 IMPLANT
SUT SILK 2 0 SH (SUTURE) ×3 IMPLANT
SUT SILK 3 0 (SUTURE) ×6
SUT SILK 3-0 18XBRD TIE 12 (SUTURE) ×2 IMPLANT
SUT VIC AB 2-0 CTX 36 (SUTURE) ×3 IMPLANT
SUT VIC AB 3-0 SH 27 (SUTURE) ×21
SUT VIC AB 3-0 SH 27X BRD (SUTURE) ×7 IMPLANT
SUT VICRYL 4-0 PS2 18IN ABS (SUTURE) ×12 IMPLANT
SYR 3ML LL SCALE MARK (SYRINGE) ×3 IMPLANT
TOWEL OR 17X24 6PK STRL BLUE (TOWEL DISPOSABLE) ×6 IMPLANT
TOWEL OR 17X26 10 PK STRL BLUE (TOWEL DISPOSABLE) ×6 IMPLANT
TRAY FOLEY CATH 16FRSI W/METER (SET/KITS/TRAYS/PACK) ×3 IMPLANT
TUBE CONNECTING 12'X1/4 (SUCTIONS) ×2
TUBE CONNECTING 12X1/4 (SUCTIONS) ×4 IMPLANT
TUBING EXTENTION W/L.L. (IV SETS) IMPLANT
UNDERPAD 30X30 INCONTINENT (UNDERPADS AND DIAPERS) ×3 IMPLANT
WATER STERILE IRR 1000ML POUR (IV SOLUTION) ×3 IMPLANT

## 2014-01-10 NOTE — ED Notes (Signed)
Consent signed [id number].

## 2014-01-10 NOTE — Op Note (Signed)
OPERATIVE REPORT  DATE OF SURGERY: 01/10/2014  PATIENT: John Ball, 73 y.o. male MRN: [id number]  DOB: [birth year]  PRE-OPERATIVE DIAGNOSIS: Severe ischemia of right foot with occluded right femoral to popliteal bypass  POST-OPERATIVE DIAGNOSIS:  Same  PROCEDURE: #1 right common femoral and profundus femoris artery thrombectomy and endarterectomy #2 right femoral to posterior tibial bypass with saphenous vein harvested from left leg  SURGEON:  Curt Jews, M.D.  ASSISTANT: Dr. Oneida Alar, [place] PA, Earl Lagos PA  ANESTHESIA:  Gen.  EBL: 100 ml  Total I/O In: 1000 [I.V.:1000] Out: -   BLOOD ADMINISTERED: None  DRAINS: None  SPECIMEN: None  COUNTS CORRECT:  YES  PLAN OF CARE: PACU   PATIENT DISPOSITION:  PACU - hemodynamically stable  PROCEDURE DETAILS: The patient presented to the emergency department. Been admitted several days prior with occluded right femoropopliteal bypass and ischemia. He had continued rest pain and presented to the emergency room for reevaluation. He did have motor and sensory function but severe pain and rest pain. He was taken to the operating room for revascularization. Recent admission included an arteriogram and also vein mapping of his left leg. He had a prior right femoral to above-knee popliteal bypass with Gore-Tex and 2010 and had a revision of this with a jump graft from the Gore-Tex with vein down to his below-knee popliteal artery. He had none tibial vessel occlusion with the posterior tibial reconstitution the main runoff into his right foot  Incision was made through the prior scar in his right groin carried down to isolate the common superficial femoral and profundus wrist arteries and also his prior Gore-Tex graft. He had extreme calcification in his common femoral and deep femoral artery. Upper incision was made in the medial calf through an old vein harvest incision is carried down to the posterior tibial artery which was of good  caliber and had minimal atherosclerotic change. The left leg had the saphenous vein marked with the SonoSite ultrasound and with multiple incisions with skip areas had harvest of his entire great saphenous vein on the left from the saphenofemoral junction down to the ankle. Retrogrades were ligated with 304 0 silk ties and divided. The vein was ligated distally just above the ankle and was transected at the saphenofemoral junction the saphenofemoral junction was occluded with a Cooley clamp and excised and the saphenofemoral junction was oversewn with a 5-0 Prolene suture. The vein was gently dilated and was found to be in excellent caliber throughout its course. A, was created in the subcutaneous tissue from the level of the cath up to the groin. The patient was given 9000 intravenous heparin. After adequate circulation time the common femoral artery was occluded up under the inguinal ligament with a Hanley clamp. The old hood of the occluded femoropopliteal was opened with an 11 blade and sent longitudinally with Potts scissors. There was thrombus and extensive plaque in the common femoral artery. This was endarterectomized up under the inguinal ligament. There was calcification but no flow limiting stenosis in the external iliac artery. There was complete occlusion of the profundus femoris artery and the superficial artery was chronically occluded. The Gore-Tex graft was occluded and this was ligated and divided. The superficial femoral artery was mobilized further distally and was chronically occluded and was ligated and divided as well. The arteriotomy was continued from the common femoral artery down onto the profundus femoris artery. The profundus was dissected several centimeters away from the origin. He did remain calcified but the  large plaque was stopped this level after several branch points. This was endarterectomized and the profundus femoris distal plaque was tacked with 6-0 Prolene sutures. The vein  was brought onto the field and the proximal portion was spatulated to use as a patch angioplasty across the common femoral and profundus endarterectomy. This was sewn as a long hood of the vein graft to function as a patch. With a running 6-0 Prolene suture. After completion anastomosis the usual flushing was undertaken in that flow was assured to the profundus femoris artery with excellent Doppler flow. Next the valvulotome was brought into the field and the vein valves were cut giving excellent flow through the vein graft. This was then brought to the prior created tunnel. The vein was marked to prevent twisting. This was brought into approximation with the posterior tibial artery. The posterior tibial artery was occluded proximally and distally with Serafin clamps was opened 11 blade salicylate Potts scissors. The vein was cut to the appropriate length and spatulated and sewn end-to-side to the posterior tibial artery with a running 6-0 Prolene suture. Prior to completion of the anastomosis a 2 dilator passed easily through the artery proximally and distally. The anastomosis was completed and flow was restored to the posterior tibial artery. Excellent graft-dependent Doppler flow was noted in the graft and in the posterior tibial artery at the ankle. The patient was given 50 mg of protamine to reverse the heparin. The wounds were irrigated with saline. The right groin was closed several layers of 2-0 Vicryl suture followed by closure the skin with a subcuticular Vicryl stitch suture. The cath incision on the right was closed with 3-0 Vicryl in the subcutaneous tissue and the skin. The vein harvest incisions were closed with 301 4-0 subcutaneous and subcuticular Vicryl sutures. Sterile dressings were applied and the patient was taken to the recovery in stable condition    Curt Jews, M.D. 01/10/2014 7:38 PM

## 2014-01-10 NOTE — ED Notes (Signed)
Wife states he was just discharged from [place] with a blood clot/blockage to RLE. He had surgery here that was unsuccessful and was discharged home on pain meds to await another surgery. Wife states the pain meds are not working and the pt is in too much pain.

## 2014-01-10 NOTE — ED Notes (Signed)
EDP at bedside unable to obtain pulses in foot.

## 2014-01-10 NOTE — Telephone Encounter (Signed)
Wife called.  Stated pt. was discharged on Saturday, and needs to be seen in the office today, due to constant pain in right lower extremity. Reported he is taking Hydrocodone/Acetaminphen 5/325 mg 2 tablets every 3 hrs. And Hydromorphone 2 mg approx. 1 1/2 hrs in-between doses of the previous.  Stated the pt. was told on discharge that Dr. Nicole Cella office would be calling to schedule an appt. in the office.  Stated "he has got to see someone, and have something done today, because he is miserable."   Discussed with Dr. Trula Slade.  Per Dr. Trula Slade, the pt. Needs to go to the ER, as there is nothing I can offer to him in the office.  Returned call to the wife.  Advised pt. would need to go to the ER; advised that he should not eat or drink anything further this morning, until it has been determined if he will need to go to surgery today.  Verb. Understanding.  Advised wife, nurse will notify the [facility] MD on call of the above.

## 2014-01-10 NOTE — Transfer of Care (Signed)
Immediate Anesthesia Transfer of Care Note  Patient: John Ball  Procedure(s) Performed: Procedure(s): Right Femoral to Posterior Tibial Bypass Graft using Left Leg Vein, Thrombectomy Right Common Femoral and Profunda of Leg .  (Right)  Patient Location: PACU  Anesthesia Type:General  Level of Consciousness: awake and patient cooperative  Airway & Oxygen Therapy: Patient Spontanous Breathing and Patient connected to nasal cannula oxygen  Post-op Assessment: Report given to PACU RN and Post -op Vital signs reviewed and stable  Post vital signs: Reviewed and stable  Complications: No apparent anesthesia complications

## 2014-01-10 NOTE — Consult Note (Signed)
[facility]  Reason for Consult:  Occlusion of right lower extremity bypass Referring Physician:  ED MRN #:  [id number]  History of Present Illness: This is a 73 y.o. male who was recently discharged from [facility] on 01/08/14. Dr. Scot Dock saw him on 01/06/14 after the patient presented with a painful leg and foot. He initially had a right femoral to above-knee popliteal bypass in April 2010. He then had a right above-knee to below-knee popliteal bypass in 2012. He had coronary bypass graft in 2004 using vein from his right leg. 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 Thrombolysis by interventional radiology was attempted but was successful due to inability to cannulate graft. He was motor and neurologically intact in his right foot and Dr. Scot Dock offered elective surgery as an outpatient. Vein mapping was performed on his left lower extremity. He was admitted for management of pain and discharged on 01/08/14. He was to follow-up in the [facility] clinic this week to discuss elective surgery.   He is here today because his pain is not well-controlled by pain medicine. He says the pain is the same and has not worsened. His motor and neuro sensation is still intact. He has rest pain in his foot and calf at night.   His past medical history includes: Hypertension treated with Altace, Hyperlipidemia not currently treated with statins. He is allergic to statins and Asprin. He is not on any antiplatelets or anticoagulants. He is a chronic tobacco abuser. He has had ABI's at the [place] within the last 6 months and was told his studies were normal.  He denies any chest pain or shortness of breath.    Past Medical History  Diagnosis Date  . Hypertension   . Hyperlipidemia   . COPD (chronic obstructive pulmonary disease)   . Depression   . Pain     legs and feet  . Coronary heart disease   . Lung nodules     . Cancer Oct 2012    Lung  . DVT (deep venous thrombosis)    Past Surgical History  Procedure Laterality Date  . Abdominal aortogram  09/16/2008    bilateral lower extremity runoff arteriography  . Extremity arteriogram  09/16/2008    right lower  . Right femoral popliteral bypass  10/24/2008    6-mm Gore Tex Graft  . Thrombectomy      right femoral to above knee popliteral Gore-Tex graft  . Angioplasty      Dacron patch of Gore-Tex and saphenour vein junction and above knee popliteral artery  . Heart bypass      Allergies  Allergen Reactions  . Aripiprazole     "makes him wild"  . Aspirin     Causes GI Bleed  . Crestor [Rosuvastatin Calcium] Itching  . Effexor [Venlafaxine Hydrochloride] Itching  . Lipitor [Atorvastatin Calcium] Itching  . Morphine And Related Itching  . Zocor [Simvastatin - High Dose] Itching    Prior to Admission medications   Medication Sig Start Date End Date Taking? Authorizing Provider  albuterol (PROVENTIL) (5 MG/ML) 0.5% nebulizer solution Take 2.5 mg by nebulization every 6 (six) hours as needed for wheezing or shortness of breath.   Yes Historical Provider, MD  clonazePAM (KLONOPIN) 1 MG tablet Take 1 mg by mouth 2 (two) times daily as needed for anxiety.   Yes Historical Provider, MD  DULoxetine (CYMBALTA) 60 MG capsule Take 60 mg by  mouth daily.    Yes Historical Provider, MD  Fluticasone-Salmeterol (ADVAIR DISKUS) 250-50 MCG/DOSE AEPB Inhale 1 puff into the lungs every 12 (twelve) hours.   Yes Historical Provider, MD  HYDROcodone-acetaminophen (NORCO/VICODIN) 5-325 MG per tablet Take 1-2 tablets by mouth every 4 (four) hours as needed for moderate pain.   Yes Historical Provider, MD  HYDROmorphone (DILAUDID) 2 MG tablet Take 1-2 mg by mouth every 4 (four) hours as needed for severe pain.   Yes Historical Provider, MD  prazosin (MINIPRESS) 1 MG capsule Take 1 mg by mouth at bedtime.   Yes Historical Provider, MD  QUEtiapine (SEROQUEL XR) 300 MG 24  hr tablet Take 300 mg by mouth at bedtime.    Yes Historical Provider, MD  ramipril (ALTACE) 10 MG capsule Take 10 mg by mouth daily.   Yes Historical Provider, MD  tiotropium (SPIRIVA) 18 MCG inhalation capsule Place 18 mcg into inhaler and inhale daily.   Yes Historical Provider, MD  traZODone (DESYREL) 150 MG tablet Take 150 mg by mouth at bedtime.   Yes Historical Provider, MD  zolpidem (AMBIEN CR) 12.5 MG CR tablet Take 12.5 mg by mouth at bedtime.     Yes Historical Provider, MD    History   Social History  . Marital Status: Married    Spouse Name: N/A    Number of Children: N/A  . Years of Education: N/A   Occupational History  . Not on file.   Social History Main Topics  . Smoking status: Current Every Day Smoker -- 0.60 packs/day for 55 years    Types: Cigarettes  . Smokeless tobacco: Never Used  . Alcohol Use: No  . Drug Use: No  . Sexual Activity: Not on file   Other Topics Concern  . Not on file   Social History Narrative  . No narrative on file    Family History  Problem Relation Age of Onset  . Other Mother 36    Abdominal Aortic Aneurysm   . Alzheimer's disease Father 3    ROS: [x]  Positive   [ ]  Negative   [ ]  All sytems reviewed and are negative  Cardiovascular: []  chest pain/pressure []  palpitations []  SOB lying flat []  DOE [x]  pain in legs while walking [x]  pain in legs at rest [x]  pain in legs at night []  non-healing ulcers []  hx of DVT []  swelling in legs  Pulmonary: []  productive cough []  asthma/wheezing []  home O2  Neurologic: []  weakness in []  arms []  legs []  numbness in []  arms []  legs []  hx of CVA []  mini stroke [] difficulty speaking or slurred speech []  temporary loss of vision in one eye []  dizziness  Hematologic: []  hx of cancer []  bleeding problems []  problems with blood clotting easily  Endocrine:   []  diabetes []  thyroid disease  GI []  vomiting blood []  blood in stool  GU: []  CKD/renal failure []  HD--[]   M/W/F or []  T/T/S []  burning with urination []  blood in urine  Psychiatric: []  anxiety []  depression  Musculoskeletal: []  arthritis []  joint pain  Integumentary: []  rashes []  ulcers  Constitutional: []  fever []  chills   Physical Examination  Filed Vitals:   01/10/14 1130  BP: 120/61  Pulse: 63  Temp:   Resp: 18   Body mass index is 28.75 kg/(m^2).  General:  WDWN in NAD Gait: Not observed HENT: WNL, normocephalic Eyes: Pupils equal Pulmonary: normal non-labored breathing Cardiac:  RRR, no m/g/r  Skin: without rashes, without ulcers  Vascular  Exam/Pulses: unable to palpate right pedal pulses. No doppler signals of right foot.  Musculoskeletal: no muscle wasting or atrophy  Neurologic: A&O X 3; Appropriate Affect ; SENSATION: normal; MOTOR FUNCTION:  moving all extremities equally. Speech is fluent/normal   CBC    Component Value Date/Time   WBC 6.4 01/08/2014 0408   RBC 4.33 01/08/2014 0408   HGB 13.6 01/08/2014 0408   HCT 41.4 01/08/2014 0408   PLT 132* 01/08/2014 0408   MCV 95.6 01/08/2014 0408   MCH 31.4 01/08/2014 0408   MCHC 32.9 01/08/2014 0408   RDW 13.4 01/08/2014 0408   LYMPHSABS 1.8 01/06/2014 0954   MONOABS 0.3 01/06/2014 0954   EOSABS 0.2 01/06/2014 0954   BASOSABS 0.0 01/06/2014 0954    BMET    Component Value Date/Time   NA 138 01/06/2014 0954   K 4.6 01/06/2014 0954   CL 100 01/06/2014 0954   CO2 22 01/06/2014 0954   GLUCOSE 107* 01/06/2014 0954   BUN 16 01/06/2014 0954   CREATININE 0.99 01/06/2014 0954   CALCIUM 9.2 01/06/2014 0954   GFRNONAA 81* 01/06/2014 0954   GFRAA >90 01/06/2014 0954    COAGS: Lab Results  Component Value Date   INR 1.01 01/07/2014   INR 1.01 01/06/2014   INR 0.94 10/05/2010    ASSESSMENT: This is a 73 y.o. 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   PLAN: -OR today for right profundoplasty and right femoral to posterior tibial bypass with vein at 1430.  -Admitting physician: Dr. Donnetta Hutching.    Virgina Jock,  PA-C [facility] Office: [phone] Pager: [phone]  I have examined the patient, reviewed and agree with above. Extensive recent hospitalization noted. Reviewed his arteriogram and prior intraoperative arteriograms as well. Progressive rest pain. We'll take to the operating room this afternoon for right femoral to posterior tibial bypass and reestablishing flow into his deep femoral artery as well. His left vein was marginal for bypass. We'll explore  and right fem-tib bypass  Alanis Clift, MD 01/10/2014 1:58 PM

## 2014-01-10 NOTE — Anesthesia Procedure Notes (Signed)
Procedure Name: Intubation Date/Time: 01/10/2014 2:26 PM Performed by: Susa Loffler Pre-anesthesia Checklist: Patient identified, Timeout performed, Emergency Drugs available, Suction available and Patient being monitored Patient Re-evaluated:Patient Re-evaluated prior to inductionOxygen Delivery Method: Circle system utilized Preoxygenation: Pre-oxygenation with 100% oxygen Intubation Type: IV induction, Rapid sequence and Cricoid Pressure applied Laryngoscope Size: Mac and 3 Grade View: Grade II Tube type: Oral Tube size: 7.5 mm Number of attempts: 1 Airway Equipment and Method: Stylet Placement Confirmation: ETT inserted through vocal cords under direct vision,  positive ETCO2 and breath sounds checked- equal and bilateral Secured at: 22 cm Tube secured with: Tape Dental Injury: Teeth and Oropharynx as per pre-operative assessment

## 2014-01-10 NOTE — Interval H&P Note (Signed)
History and Physical Interval Note:  01/10/2014 2:02 PM  John Ball  has presented today for surgery, with the diagnosis of r foot ischemia  The various methods of treatment have been discussed with the patient and family. After consideration of risks, benefits and other options for treatment, the patient has consented to  Procedure(s): BYPASS GRAFT RIGHT FEMORAL- POSTERIOR TIBIAL ARTERY WITH LEFT LEG VEIN (Right) as a surgical intervention .  The patient's history has been reviewed, patient examined, no change in status, stable for surgery.  I have reviewed the patient's chart and labs.  Questions were answered to the patient's satisfaction.     Aarnav Steagall

## 2014-01-10 NOTE — ED Notes (Signed)
Transport to Surgery

## 2014-01-10 NOTE — Anesthesia Postprocedure Evaluation (Signed)
  Anesthesia Post-op Note  Patient: John Ball  Procedure(s) Performed: Procedure(s): Right Femoral to Posterior Tibial Bypass Graft using Left Leg Vein, Thrombectomy Right Common Femoral and Profunda of Leg .  (Right)  Patient Location: PACU  Anesthesia Type:General  Level of Consciousness: awake and alert   Airway and Oxygen Therapy: Patient Spontanous Breathing  Post-op Pain: none  Post-op Assessment: Post-op Vital signs reviewed, Patient's Cardiovascular Status Stable and Respiratory Function Stable  Post-op Vital Signs: Reviewed  Filed Vitals:   01/10/14 2003  BP: 116/78  Pulse: 84  Temp: 36.7 C  Resp:     Complications: No apparent anesthesia complications

## 2014-01-10 NOTE — ED Notes (Signed)
Vascular Surgeon at bedside. 

## 2014-01-10 NOTE — ED Notes (Signed)
Doctor in ED stated to have blood drawn before patient transported to surgery.

## 2014-01-10 NOTE — H&P (View-Only) (Signed)
[facility]  Reason for Consult:  Occlusion of right lower extremity bypass Referring Physician:  ED MRN #:  [id number]  History of Present Illness: This is a 73 y.o. male who was recently discharged from [facility] on 01/08/14. Dr. Scot Dock saw him on 01/06/14 after the patient presented with a painful leg and foot. He initially had a right femoral to above-knee popliteal bypass in April 2010. He then had a right above-knee to below-knee popliteal bypass in 2012. He had coronary bypass graft in 2004 using vein from his right leg. 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 Thrombolysis by interventional radiology was attempted but was successful due to inability to cannulate graft. He was motor and neurologically intact in his right foot and Dr. Scot Dock offered elective surgery as an outpatient. Vein mapping was performed on his left lower extremity. He was admitted for management of pain and discharged on 01/08/14. He was to follow-up in the [facility] clinic this week to discuss elective surgery.   He is here today because his pain is not well-controlled by pain medicine. He says the pain is the same and has not worsened. His motor and neuro sensation is still intact. He has rest pain in his foot and calf at night.   His past medical history includes: Hypertension treated with Altace, Hyperlipidemia not currently treated with statins. He is allergic to statins and Asprin. He is not on any antiplatelets or anticoagulants. He is a chronic tobacco abuser. He has had ABI's at the [place] within the last 6 months and was told his studies were normal.  He denies any chest pain or shortness of breath.    Past Medical History  Diagnosis Date  . Hypertension   . Hyperlipidemia   . COPD (chronic obstructive pulmonary disease)   . Depression   . Pain     legs and feet  . Coronary heart disease   . Lung nodules     . Cancer Oct 2012    Lung  . DVT (deep venous thrombosis)    Past Surgical History  Procedure Laterality Date  . Abdominal aortogram  09/16/2008    bilateral lower extremity runoff arteriography  . Extremity arteriogram  09/16/2008    right lower  . Right femoral popliteral bypass  10/24/2008    6-mm Gore Tex Graft  . Thrombectomy      right femoral to above knee popliteral Gore-Tex graft  . Angioplasty      Dacron patch of Gore-Tex and saphenour vein junction and above knee popliteral artery  . Heart bypass      Allergies  Allergen Reactions  . Aripiprazole     "makes him wild"  . Aspirin     Causes GI Bleed  . Crestor [Rosuvastatin Calcium] Itching  . Effexor [Venlafaxine Hydrochloride] Itching  . Lipitor [Atorvastatin Calcium] Itching  . Morphine And Related Itching  . Zocor [Simvastatin - High Dose] Itching    Prior to Admission medications   Medication Sig Start Date End Date Taking? Authorizing Provider  albuterol (PROVENTIL) (5 MG/ML) 0.5% nebulizer solution Take 2.5 mg by nebulization every 6 (six) hours as needed for wheezing or shortness of breath.   Yes Historical Provider, MD  clonazePAM (KLONOPIN) 1 MG tablet Take 1 mg by mouth 2 (two) times daily as needed for anxiety.   Yes Historical Provider, MD  DULoxetine (CYMBALTA) 60 MG capsule Take 60 mg by  mouth daily.    Yes Historical Provider, MD  Fluticasone-Salmeterol (ADVAIR DISKUS) 250-50 MCG/DOSE AEPB Inhale 1 puff into the lungs every 12 (twelve) hours.   Yes Historical Provider, MD  HYDROcodone-acetaminophen (NORCO/VICODIN) 5-325 MG per tablet Take 1-2 tablets by mouth every 4 (four) hours as needed for moderate pain.   Yes Historical Provider, MD  HYDROmorphone (DILAUDID) 2 MG tablet Take 1-2 mg by mouth every 4 (four) hours as needed for severe pain.   Yes Historical Provider, MD  prazosin (MINIPRESS) 1 MG capsule Take 1 mg by mouth at bedtime.   Yes Historical Provider, MD  QUEtiapine (SEROQUEL XR) 300 MG 24  hr tablet Take 300 mg by mouth at bedtime.    Yes Historical Provider, MD  ramipril (ALTACE) 10 MG capsule Take 10 mg by mouth daily.   Yes Historical Provider, MD  tiotropium (SPIRIVA) 18 MCG inhalation capsule Place 18 mcg into inhaler and inhale daily.   Yes Historical Provider, MD  traZODone (DESYREL) 150 MG tablet Take 150 mg by mouth at bedtime.   Yes Historical Provider, MD  zolpidem (AMBIEN CR) 12.5 MG CR tablet Take 12.5 mg by mouth at bedtime.     Yes Historical Provider, MD    History   Social History  . Marital Status: Married    Spouse Name: N/A    Number of Children: N/A  . Years of Education: N/A   Occupational History  . Not on file.   Social History Main Topics  . Smoking status: Current Every Day Smoker -- 0.60 packs/day for 55 years    Types: Cigarettes  . Smokeless tobacco: Never Used  . Alcohol Use: No  . Drug Use: No  . Sexual Activity: Not on file   Other Topics Concern  . Not on file   Social History Narrative  . No narrative on file    Family History  Problem Relation Age of Onset  . Other Mother 45    Abdominal Aortic Aneurysm   . Alzheimer's disease Father 72    ROS: [x]  Positive   [ ]  Negative   [ ]  All sytems reviewed and are negative  Cardiovascular: []  chest pain/pressure []  palpitations []  SOB lying flat []  DOE [x]  pain in legs while walking [x]  pain in legs at rest [x]  pain in legs at night []  non-healing ulcers []  hx of DVT []  swelling in legs  Pulmonary: []  productive cough []  asthma/wheezing []  home O2  Neurologic: []  weakness in []  arms []  legs []  numbness in []  arms []  legs []  hx of CVA []  mini stroke [] difficulty speaking or slurred speech []  temporary loss of vision in one eye []  dizziness  Hematologic: []  hx of cancer []  bleeding problems []  problems with blood clotting easily  Endocrine:   []  diabetes []  thyroid disease  GI []  vomiting blood []  blood in stool  GU: []  CKD/renal failure []  HD--[]   M/W/F or []  T/T/S []  burning with urination []  blood in urine  Psychiatric: []  anxiety []  depression  Musculoskeletal: []  arthritis []  joint pain  Integumentary: []  rashes []  ulcers  Constitutional: []  fever []  chills   Physical Examination  Filed Vitals:   01/10/14 1130  BP: 120/61  Pulse: 63  Temp:   Resp: 18   Body mass index is 28.75 kg/(m^2).  General:  WDWN in NAD Gait: Not observed HENT: WNL, normocephalic Eyes: Pupils equal Pulmonary: normal non-labored breathing Cardiac:  RRR, no m/g/r  Skin: without rashes, without ulcers  Vascular  Exam/Pulses: unable to palpate right pedal pulses. No doppler signals of right foot.  Musculoskeletal: no muscle wasting or atrophy  Neurologic: A&O X 3; Appropriate Affect ; SENSATION: normal; MOTOR FUNCTION:  moving all extremities equally. Speech is fluent/normal   CBC    Component Value Date/Time   WBC 6.4 01/08/2014 0408   RBC 4.33 01/08/2014 0408   HGB 13.6 01/08/2014 0408   HCT 41.4 01/08/2014 0408   PLT 132* 01/08/2014 0408   MCV 95.6 01/08/2014 0408   MCH 31.4 01/08/2014 0408   MCHC 32.9 01/08/2014 0408   RDW 13.4 01/08/2014 0408   LYMPHSABS 1.8 01/06/2014 0954   MONOABS 0.3 01/06/2014 0954   EOSABS 0.2 01/06/2014 0954   BASOSABS 0.0 01/06/2014 0954    BMET    Component Value Date/Time   NA 138 01/06/2014 0954   K 4.6 01/06/2014 0954   CL 100 01/06/2014 0954   CO2 22 01/06/2014 0954   GLUCOSE 107* 01/06/2014 0954   BUN 16 01/06/2014 0954   CREATININE 0.99 01/06/2014 0954   CALCIUM 9.2 01/06/2014 0954   GFRNONAA 81* 01/06/2014 0954   GFRAA >90 01/06/2014 0954    COAGS: Lab Results  Component Value Date   INR 1.01 01/07/2014   INR 1.01 01/06/2014   INR 0.94 10/05/2010    ASSESSMENT: This is a 73 y.o. 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   PLAN: -OR today for right profundoplasty and right femoral to posterior tibial bypass with vein at 1430.  -Admitting physician: Dr. Donnetta Hutching.    Virgina Jock,  PA-C [facility] Office: [phone] Pager: [phone]  I have examined the patient, reviewed and agree with above. Extensive recent hospitalization noted. Reviewed his arteriogram and prior intraoperative arteriograms as well. Progressive rest pain. We'll take to the operating room this afternoon for right femoral to posterior tibial bypass and reestablishing flow into his deep femoral artery as well. His left vein was marginal for bypass. We'll explore  and right fem-tib bypass  Bleu Moisan, MD 01/10/2014 1:58 PM

## 2014-01-10 NOTE — Anesthesia Preprocedure Evaluation (Signed)
Anesthesia Evaluation  Patient identified by MRN, date of birth, ID band Patient awake    Reviewed: Allergy & Precautions, H&P , NPO status , Patient's Chart, lab work & pertinent test results, reviewed documented beta blocker date and time   Airway Mallampati: II TM Distance: >3 FB Neck ROM: full    Dental   Pulmonary COPDCurrent Smoker,  breath sounds clear to auscultation        Cardiovascular hypertension, On Medications + CAD, + CABG and + Peripheral Vascular Disease Rhythm:regular     Neuro/Psych PSYCHIATRIC DISORDERS negative neurological ROS     GI/Hepatic negative GI ROS, Neg liver ROS,   Endo/Other  negative endocrine ROS  Renal/GU negative Renal ROS  negative genitourinary   Musculoskeletal   Abdominal   Peds  Hematology negative hematology ROS (+)   Anesthesia Other Findings See surgeon's H&P   Reproductive/Obstetrics negative OB ROS                           Anesthesia Physical Anesthesia Plan  ASA: III and emergent  Anesthesia Plan: General   Post-op Pain Management:    Induction: Intravenous, Rapid sequence and Cricoid pressure planned  Airway Management Planned: Oral ETT  Additional Equipment:   Intra-op Plan:   Post-operative Plan: Extubation in OR  Informed Consent: I have reviewed the patients History and Physical, chart, labs and discussed the procedure including the risks, benefits and alternatives for the proposed anesthesia with the patient or authorized representative who has indicated his/her understanding and acceptance.   Dental Advisory Given  Plan Discussed with: CRNA and Surgeon  Anesthesia Plan Comments:         Anesthesia Quick Evaluation

## 2014-01-10 NOTE — ED Provider Notes (Addendum)
CSN: [id number]     Arrival date & time 01/10/14  1027 History   First MD Initiated Contact with Patient 01/10/14 1053     Chief Complaint  Patient presents with  . Leg Pain     (Consider location/radiation/quality/duration/timing/severity/associated sxs/prior Treatment) HPI Comments: Pt recently admitted for occlusion of the right lower ext that was no amenable to thrombectomy.  Upon d/c pt had vein mapping and plan for surgery in about 1 week with Dr. Scot Dock.  Pt was sent home on dialudid and hydrocodone which he has been taking however he has severe pain 10/10 anytime he tries to walk short distances and then takes 45-42min to control the pain despite taking medication.  Denies any new falls or change in exam from leaving the hospital.  Spoke with vascular today and was told to come the ER for further evaluation.  Patient is a 73 y.o. male presenting with leg pain. The history is provided by the patient and the spouse.  Leg Pain Location:  Leg and foot Time since incident:  1 week Injury: no   Leg location:  R lower leg Foot location:  R foot Pain details:    Quality:  Aching, sharp, throbbing and shooting   Radiates to:  Does not radiate   Severity:  Severe   Onset quality:  Gradual   Timing:  Constant   Progression:  Waxing and waning Relieved by:  Rest (opiates) Worsened by:  Activity Associated symptoms: no fever and no swelling   Associated symptoms comment:  Still able to move the leg and toes.  Sensation intact   Past Medical History  Diagnosis Date  . Hypertension   . Hyperlipidemia   . COPD (chronic obstructive pulmonary disease)   . Depression   . Pain     legs and feet  . Coronary heart disease   . Lung nodules   . Cancer Oct 2012    Lung  . DVT (deep venous thrombosis)    Past Surgical History  Procedure Laterality Date  . Abdominal aortogram  09/16/2008    bilateral lower extremity runoff arteriography  . Extremity arteriogram  09/16/2008    right  lower  . Right femoral popliteral bypass  10/24/2008    6-mm Gore Tex Graft  . Thrombectomy      right femoral to above knee popliteral Gore-Tex graft  . Angioplasty      Dacron patch of Gore-Tex and saphenour vein junction and above knee popliteral artery  . Heart bypass     Family History  Problem Relation Age of Onset  . Other Mother 30    Abdominal Aortic Aneurysm   . Alzheimer's disease Father 67   History  Substance Use Topics  . Smoking status: Current Every Day Smoker -- 0.60 packs/day for 55 years    Types: Cigarettes  . Smokeless tobacco: Never Used  . Alcohol Use: No    Review of Systems  Constitutional: Negative for fever.  All other systems reviewed and are negative.     Allergies  Aripiprazole; Aspirin; Crestor; Effexor; Lipitor; Morphine and related; and Zocor  Home Medications   Prior to Admission medications   Medication Sig Start Date End Date Taking? Authorizing Provider  acetaminophen (TYLENOL) 500 MG tablet Take 500 mg by mouth every 6 (six) hours as needed for headache (pain).     Historical Provider, MD  albuterol (PROVENTIL) (5 MG/ML) 0.5% nebulizer solution Take 2.5 mg by nebulization every 6 (six) hours as needed for wheezing  or shortness of breath.    Historical Provider, MD  clonazePAM (KLONOPIN) 1 MG tablet Take 1 mg by mouth 2 (two) times daily as needed for anxiety.    Historical Provider, MD  DULoxetine (CYMBALTA) 60 MG capsule Take 60 mg by mouth daily.     Historical Provider, MD  Fluticasone-Salmeterol (ADVAIR DISKUS) 250-50 MCG/DOSE AEPB Inhale 1 puff into the lungs every 12 (twelve) hours.    Historical Provider, MD  HYDROcodone-acetaminophen (NORCO/VICODIN) 5-325 MG per tablet Take 1-2 tablets by mouth every 4 (four) hours as needed for moderate pain. 01/08/14   Alvia Grove, PA-C  HYDROmorphone (DILAUDID) 2 MG tablet Take 0.5 tablets (1 mg total) by mouth every 6 (six) hours as needed for severe pain (for breakthrough pain). 01/08/14    Alvia Grove, PA-C  prazosin (MINIPRESS) 1 MG capsule Take 1 mg by mouth at bedtime.    Historical Provider, MD  QUEtiapine (SEROQUEL XR) 300 MG 24 hr tablet Take 300 mg by mouth at bedtime.     Historical Provider, MD  ramipril (ALTACE) 10 MG capsule Take 10 mg by mouth daily.    Historical Provider, MD  tiotropium (SPIRIVA) 18 MCG inhalation capsule Place 18 mcg into inhaler and inhale daily.    Historical Provider, MD  traZODone (DESYREL) 150 MG tablet Take 150 mg by mouth at bedtime.    Historical Provider, MD  zolpidem (AMBIEN CR) 12.5 MG CR tablet Take 12.5 mg by mouth at bedtime.      Historical Provider, MD   BP 121/74  Pulse 62  Temp(Src) 98.3 F (36.8 C) (Oral)  Resp 17  Ht 6' (1.829 m)  Wt 212 lb (96.163 kg)  BMI 28.75 kg/m2  SpO2 94% Physical Exam  Nursing note and vitals reviewed. Constitutional: He is oriented to person, place, and time. He appears well-developed and well-nourished. No distress.  HENT:  Head: Normocephalic and atraumatic.  Eyes: EOM are normal. Pupils are equal, round, and reactive to light.  Cardiovascular: Normal rate.   Pulmonary/Chest: Effort normal.  Musculoskeletal:  Right foot from the ankle down is cold to the touch.  Toes slightly mottled compared to left and cyanotic nailbeds.  No palpable or dopplerable PT or DP pulses.  Pt is able to flex and extend the foot and sensation is intact.  No reproducible right calf pain  Neurological: He is alert and oriented to person, place, and time.  Psychiatric: He has a normal mood and affect. His behavior is normal.    ED Course  Procedures (including critical care time) Labs Review Labs Reviewed - No data to display  Imaging Review No results found.   EKG Interpretation None      MDM   Final diagnoses:  Arterial occlusion    Patient with evidence of arterial compromise of the right lower extremity. Patient was admitted 01-06-14 discharge 01-08-14 after attempts at thrombectomy of his  bypass graft. Patient had been napping before leaving and was scheduled for surgery next week defects or occlusion. No palpable or dopplerable pulse at the DP or PT on exam. His foot is cold to the touch and his toes are slightly mottled. Patient and his wife has been exam since being hospitalized and has not changed. He returned today because of uncontrolled pain in his right calf and leg when he attempts to ambulate. He is taking hydrocodone and Dilaudid tablets. I spoke with the vascular office today who recommended he come in for further care.  Currently the patient  is lying in the bed his pain is only a 3/10. He is not requesting any pain medication at this time. Will discuss with vascular.  11:42 AM Dr. Donnetta Hutching evaluating the pt.   Blanchie Dessert, MD 01/10/14 1142  Blanchie Dessert, MD 01/10/14 1142

## 2014-01-10 NOTE — ED Notes (Signed)
Phlebotomy at bedside.

## 2014-01-11 DIAGNOSIS — I743 Embolism and thrombosis of arteries of the lower extremities: Secondary | ICD-10-CM

## 2014-01-11 LAB — BASIC METABOLIC PANEL
Anion gap: 13 (ref 5–15)
BUN: 12 mg/dL (ref 6–23)
CO2: 24 meq/L (ref 19–32)
CREATININE: 0.91 mg/dL (ref 0.50–1.35)
Calcium: 8.6 mg/dL (ref 8.4–10.5)
Chloride: 104 mEq/L (ref 96–112)
GFR calc Af Amer: >90 mL/min (ref >90)
GFR calc non Af Amer: 84 mL/min — ABNORMAL LOW (ref >90)
GLUCOSE: 93 mg/dL (ref 70–99)
Potassium: 4.1 mEq/L (ref 3.7–5.3)
SODIUM: 141 meq/L (ref 137–147)

## 2014-01-11 LAB — CBC
HCT: 40.7 % (ref 39.0–52.0)
HEMOGLOBIN: 13.7 g/dL (ref 13.0–17.0)
MCH: 32.2 pg (ref 26.0–34.0)
MCHC: 33.7 g/dL (ref 30.0–36.0)
MCV: 95.8 fL (ref 78.0–100.0)
PLATELETS: 130 10*3/uL — AB (ref 150–400)
RBC: 4.25 MIL/uL (ref 4.22–5.81)
RDW: 13.8 % (ref 11.5–15.5)
WBC: 7.7 10*3/uL (ref 4.0–10.5)

## 2014-01-11 MED ORDER — NICOTINE 21 MG/24HR TD PT24
21.0000 mg | MEDICATED_PATCH | Freq: Every day | TRANSDERMAL | Status: DC
  Administered 2014-01-11 – 2014-01-12 (×2): 21 mg via TRANSDERMAL
  Filled 2014-01-11 (×2): qty 1

## 2014-01-11 NOTE — Progress Notes (Signed)
Patient tolerated ambulation in hallway, using rolling walker gait steady 250 feet Joylene Draft A

## 2014-01-11 NOTE — Progress Notes (Signed)
Pt transferred to 2w30 via wheelchair with monitor and wife with patient

## 2014-01-11 NOTE — Evaluation (Signed)
Physical Therapy Evaluation Patient Details Name: John Ball MRN: [id number] DOB: [birth year] Today's Date: 01/11/2014   History of Present Illness  Severe ischemia of right foot with occluded right femoral to popliteal bypass and underwent RLE bypass on 01/10/14.  Clinical Impression  Pt admitted with above. Pt currently with functional limitations due to the deficits listed below (see PT Problem List).  Pt will benefit from skilled PT to increase their independence and safety with mobility to allow discharge home with wife.      Follow Up Recommendations No PT follow up    Equipment Recommendations  None recommended by PT    Recommendations for Other Services       Precautions / Restrictions Precautions Precautions: Fall      Mobility  Bed Mobility Overal bed mobility: Modified Independent                Transfers Overall transfer level: Needs assistance Equipment used: Rolling walker (2 wheeled) Transfers: Sit to/from Stand Sit to Stand: Min assist         General transfer comment: Verbal cues for hand placement and assist to bring hips up.  Ambulation/Gait Ambulation/Gait assistance: Min assist Ambulation Distance (Feet): 110 Feet Assistive device: Rolling walker (2 wheeled) Gait Pattern/deviations: Step-through pattern;Decreased step length - left;Decreased stance time - right;Antalgic Gait velocity: decr Gait velocity interpretation: Below normal speed for age/gender    Stairs            Wheelchair Mobility    Modified Rankin (Stroke Patients Only)       Balance Overall balance assessment: Needs assistance Sitting-balance support: No upper extremity supported Sitting balance-Leahy Scale: Fair     Standing balance support: Bilateral upper extremity supported Standing balance-Leahy Scale: Poor Standing balance comment: Support of walker                             Pertinent Vitals/Pain VSS    Home Living  Family/patient expects to be discharged to:: Private residence Living Arrangements: Spouse/significant other Available Help at Discharge: Family Type of Home: House Home Access: Level entry     Home Layout: One level Home Equipment: Environmental consultant - 2 wheels      Prior Function Level of Independence: Independent               Hand Dominance        Extremity/Trunk Assessment   Upper Extremity Assessment: Overall WFL for tasks assessed           Lower Extremity Assessment: RLE deficits/detail RLE Deficits / Details: Decr due to pain/soreness.       Communication   Communication: No difficulties  Cognition Arousal/Alertness: Awake/alert Behavior During Therapy: WFL for tasks assessed/performed Overall Cognitive Status: Within Functional Limits for tasks assessed                      General Comments      Exercises        Assessment/Plan    PT Assessment Patient needs continued PT services  PT Diagnosis Difficulty walking;Generalized weakness;Acute pain   PT Problem List Decreased strength;Decreased balance;Decreased mobility;Decreased knowledge of use of DME;Pain  PT Treatment Interventions DME instruction;Gait training;Functional mobility training;Therapeutic activities;Therapeutic exercise;Balance training;Patient/family education   PT Goals (Current goals can be found in the Care Plan section) Acute Rehab PT Goals Patient Stated Goal: Return home PT Goal Formulation: With patient Time For Goal Achievement: 01/18/14 Potential to Achieve  Goals: Good    Frequency Min 3X/week   Barriers to discharge        Co-evaluation               End of Session Equipment Utilized During Treatment: Gait belt Activity Tolerance: Patient tolerated treatment well Patient left: in chair;with nursing/sitter in room (Pt in w/c for transfer to new room) Nurse Communication: Mobility status         Time: 8466-5993 PT Time Calculation (min): 16  min   Charges:   PT Evaluation $Initial PT Evaluation Tier I: 1 Procedure PT Treatments $Gait Training: 8-22 mins   PT G Codes:          Averie Meiner 17-Jan-2014, 11:18 AM  Suanne Marker PT [phone]

## 2014-01-11 NOTE — Progress Notes (Signed)
VASCULAR LAB PRELIMINARY  ARTERIAL  ABI completed:    RIGHT    LEFT    PRESSURE WAVEFORM  PRESSURE WAVEFORM  BRACHIAL 128 Triphasic  BRACHIAL 127  Triphasic   DP  absent DP  absent  AT  absent AT  absent  PT 119 Monophasic  PT 117 Monophasic   PER 91 Monophasic  PER 94 Monophasic   GREAT TOE  NA GREAT TOE  NA    RIGHT LEFT  ABI 0.93 0.91     Dhamar Gregory, RVT 01/11/2014, 12:30 PM

## 2014-01-11 NOTE — Plan of Care (Signed)
Problem: Phase I Progression Outcomes Goal: Patient extubated within - Outcome: Completed/Met Date Met:  01/11/14 In PACU

## 2014-01-11 NOTE — Progress Notes (Signed)
Attempted to see pt x2.  Pt at test. Will attempt back as schedule allows. John Ball, [place] [phone]

## 2014-01-11 NOTE — Progress Notes (Addendum)
  [facility] Progress Note  01/11/2014 7:31 AM 1 Day Post-Op  Subjective:  Some soreness in legs and right foot. Otherwise no other complaints. Pain is well-controlled.   Tmax 98.9 BP sys 110s-160s O2 99% 2L  Filed Vitals:   01/11/14 0705  BP:   Pulse:   Temp: 98.9 F (37.2 C)  Resp:     Physical Exam: Incisions:  Bilateral leg incisions dressed. Will take down dressings tomorrow.  Extremities: DP and PT doppler signals of right foot. PT signal on left.  Palpable right graft pulse. Both feet are warm. Motor and sensation intact.   CBC    Component Value Date/Time   WBC 7.7 01/11/2014 0405   RBC 4.25 01/11/2014 0405   HGB 13.7 01/11/2014 0405   HCT 40.7 01/11/2014 0405   PLT 130* 01/11/2014 0405   MCV 95.8 01/11/2014 0405   MCH 32.2 01/11/2014 0405   MCHC 33.7 01/11/2014 0405   RDW 13.8 01/11/2014 0405   LYMPHSABS 1.8 01/06/2014 0954   MONOABS 0.3 01/06/2014 0954   EOSABS 0.2 01/06/2014 0954   BASOSABS 0.0 01/06/2014 0954    BMET    Component Value Date/Time   NA 141 01/11/2014 0405   K 4.1 01/11/2014 0405   CL 104 01/11/2014 0405   CO2 24 01/11/2014 0405   GLUCOSE 93 01/11/2014 0405   BUN 12 01/11/2014 0405   CREATININE 0.91 01/11/2014 0405   CALCIUM 8.6 01/11/2014 0405   GFRNONAA 84* 01/11/2014 0405   GFRAA >90 01/11/2014 0405    INR    Component Value Date/Time   INR 1.01 01/07/2014 0452     Intake/Output Summary (Last 24 hours) at 01/11/14 0731 Last data filed at 01/11/14 0700  Gross per 24 hour  Intake   2650 ml  Output   1875 ml  Net    775 ml     Assessment:  73 y.o. male is s/p:  1) right common femoral and profundus femoris artery thrombectomy and endarterectomy 2) right femoral to posterior tibial bypass with saphenous vein harvested from left leg   1 Day Post-Op  Plan: -DP/PT dopper signals right foot and palpable graft pulse. Both feet arm warm.  -PT and OT  -Hgb stable postoperatively. -Foley discontinued.  -Will transfer to 2W today. -DVT  prophylaxis:  Lovenox     Virgina Jock, PA-C [facility] Office: [phone] Pager: [phone] 01/11/2014 7:31 AM     I have examined the patient, reviewed and agree with above. Comfortable this morning with incisional soreness. Right foot rest pain has resolved. Dressings ultra intact. No hematomas. Right foot well-perfused and 3+ graft pulse on the right  Ersie Savino, MD 01/11/2014 8:06 AM

## 2014-01-12 ENCOUNTER — Encounter (HOSPITAL_COMMUNITY): Payer: Self-pay | Admitting: Vascular Surgery

## 2014-01-12 ENCOUNTER — Telehealth: Payer: Self-pay | Admitting: Vascular Surgery

## 2014-01-12 MED ORDER — HYDROCODONE-ACETAMINOPHEN 5-325 MG PO TABS: 1.0000 | ORAL_TABLET | ORAL | Status: DC | PRN

## 2014-01-12 NOTE — Progress Notes (Signed)
Physical Therapy Treatment Patient Details Name: John Ball MRN: [id number] DOB: 12/08/1940 Today's Date: 01/12/2014    History of Present Illness Severe ischemia of right foot with occluded right femoral to popliteal bypass and underwent RLE bypass on 01/10/14.    PT Comments    Pt has met PT goals and plan is to d/c home today. Pt ambulated 300' with RW and supervision. PT signing off.   Follow Up Recommendations  No PT follow up     Equipment Recommendations  None recommended by PT    Recommendations for Other Services       Precautions / Restrictions Precautions Precautions: Fall Restrictions Weight Bearing Restrictions: No    Mobility  Bed Mobility Overal bed mobility: Modified Independent             General bed mobility comments: pt able to get in and out of bed and well as scoot to head  Transfers Overall transfer level: Modified independent Equipment used: Rolling walker (2 wheeled) Transfers: Sit to/from Stand           General transfer comment: no assistance needed, proper hand placement demo by pt  Ambulation/Gait Ambulation/Gait assistance: Supervision Ambulation Distance (Feet): 300 Feet Assistive device: Rolling walker (2 wheeled) Gait Pattern/deviations: Step-through pattern;Decreased stride length;Antalgic Gait velocity: decr   General Gait Details: pt still with antalgic pattern and decreased wt to left side causing left drift but self corrected and educated on gait progression as pain decreases   Stairs            Wheelchair Mobility    Modified Rankin (Stroke Patients Only)       Balance Overall balance assessment: Needs assistance Sitting-balance support: No upper extremity supported Sitting balance-Leahy Scale: Good     Standing balance support: Bilateral upper extremity supported Standing balance-Leahy Scale: Poor Standing balance comment: requires support due to pain                    Cognition  Arousal/Alertness: Awake/alert Behavior During Therapy: WFL for tasks assessed/performed Overall Cognitive Status: Within Functional Limits for tasks assessed                      Exercises General Exercises - Lower Extremity Ankle Circles/Pumps: AROM;Both;10 reps;Seated Long Arc Quad: AROM;Both;10 reps;Seated    General Comments        Pertinent Vitals/Pain 6/10 LLE pain on pain meds, positioned for comfort    Home Living                      Prior Function            PT Goals (current goals can now be found in the care plan section) Acute Rehab PT Goals Patient Stated Goal: Return home PT Goal Formulation: With patient Time For Goal Achievement: 01/18/14 Potential to Achieve Goals: Good Progress towards PT goals: Goals met/education completed, patient discharged from PT    Frequency  Min 3X/week    PT Plan Current plan remains appropriate    Co-evaluation             End of Session Equipment Utilized During Treatment: Gait belt Activity Tolerance: Patient tolerated treatment well Patient left: in bed;with call bell/phone within reach;with family/visitor present     Time: 2620-3559 PT Time Calculation (min): 19 min  Charges:  $Gait Training: 8-22 mins  G Codes:     Leighton Roach, PT  Acute Rehab Services  [phone]  Leighton Roach 01/12/2014, 9:43 AM

## 2014-01-12 NOTE — Evaluation (Signed)
Occupational Therapy Evaluation Patient Details Name: John Ball MRN: [id number] DOB: 26-May-1941 Today's Date: 01/12/2014    History of Present Illness Severe ischemia of right foot with occluded right femoral to popliteal bypass and underwent RLE bypass on 01/10/14.   Clinical Impression   Pt presents to OT at overall S- min A with ADL activity. Wife will A as needed.  Education complete    Follow Up Recommendations  No OT follow up    Equipment Recommendations  None recommended by OT    Recommendations for Other Services       Precautions / Restrictions Precautions Precautions: Fall Restrictions Weight Bearing Restrictions: No      Mobility Bed Mobility Overal bed mobility: Modified Independent             General bed mobility comments: pt able to get in and out of bed and well as scoot to head  Transfers Overall transfer level: Modified independent Equipment used: Rolling walker (2 wheeled) Transfers: Sit to/from Stand           General transfer comment: no assistance needed, proper hand placement demo by pt    Balance Overall balance assessment: Needs assistance Sitting-balance support: No upper extremity supported Sitting balance-Leahy Scale: Good     Standing balance support: Bilateral upper extremity supported Standing balance-Leahy Scale: Poor Standing balance comment: requires support due to pain                            ADL Overall ADL's : Needs assistance/impaired                                       General ADL Comments: Pt overall min a with ADL activity and wife will A as needed.  Wife will also provide 24/7 S initially. Educated regarding safety with ADL activity      Vision                            Extremity/Trunk Assessment     Lower Extremity Assessment RLE Deficits / Details: Decr due to pain/soreness.       Communication Communication Communication: No difficulties    Cognition Arousal/Alertness: Awake/alert Behavior During Therapy: WFL for tasks assessed/performed Overall Cognitive Status: Within Functional Limits for tasks assessed                     General Comments       Exercises Exercises: General Lower Extremity          Home Living Family/patient expects to be discharged to:: Private residence Living Arrangements: Spouse/significant other Available Help at Discharge: Family Type of Home: House Home Access: Level entry     [place]: [place]: Environmental consultant - 2 wheels          Prior Functioning/Environment Level of Independence: Independent                      OT Goals(Current goals can be found in the care plan section) Acute Rehab OT Goals Patient Stated Goal: Return home  OT Frequency:                End of Session    Activity Tolerance:  Patient tolerated treatment well Patient left: in bed;with family/visitor present;with call bell/phone within reach   Time: 1000-1017 OT Time Calculation (min): 17 min Charges:  OT General Charges $OT Visit: 1 Procedure OT Evaluation $Initial OT Evaluation Tier I: 1 Procedure OT Treatments $Self Care/Home Management : 8-22 mins G-Codes:    Payton Mccallum D 01/23/2014, 10:17 AM

## 2014-01-12 NOTE — Progress Notes (Signed)
Discharge education, medication, prescriptions, and follow-up appts reviewed with pt and wife, both stated understanding, both stated they had a walker at home to use, incision care reviewed and emphasized, IV and tele removed, pt getting dressed Rickard Rhymes, RN

## 2014-01-12 NOTE — Progress Notes (Addendum)
  [facility] Progress Note  01/12/2014 7:35 AM 2 Days Post-Op  Subjective:  No complaints--ready to go home  Afebrile VSS 96% 2LO2NC-pt on RA when I went in the room  Filed Vitals:   01/11/14 2110  BP: 103/54  Pulse: 80  Temp: 98.2 F (36.8 C)  Resp: 16    Physical Exam: Incisions:  Bandages removed--All saphenous vein harvest sites and bilateral groin wounds are c/d/i with steri strips in place. Extremities:  + palpable graft pulse right; + palpable right PT  CBC    Component Value Date/Time   WBC 7.7 01/11/2014 0405   RBC 4.25 01/11/2014 0405   HGB 13.7 01/11/2014 0405   HCT 40.7 01/11/2014 0405   PLT 130* 01/11/2014 0405   MCV 95.8 01/11/2014 0405   MCH 32.2 01/11/2014 0405   MCHC 33.7 01/11/2014 0405   RDW 13.8 01/11/2014 0405   LYMPHSABS 1.8 01/06/2014 0954   MONOABS 0.3 01/06/2014 0954   EOSABS 0.2 01/06/2014 0954   BASOSABS 0.0 01/06/2014 0954    BMET    Component Value Date/Time   NA 141 01/11/2014 0405   K 4.1 01/11/2014 0405   CL 104 01/11/2014 0405   CO2 24 01/11/2014 0405   GLUCOSE 93 01/11/2014 0405   BUN 12 01/11/2014 0405   CREATININE 0.91 01/11/2014 0405   CALCIUM 8.6 01/11/2014 0405   GFRNONAA 84* 01/11/2014 0405   GFRAA >90 01/11/2014 0405    INR    Component Value Date/Time   INR 1.01 01/07/2014 0452     Intake/Output Summary (Last 24 hours) at 01/12/14 0735 Last data filed at 01/12/14 0100  Gross per 24 hour  Intake   1080 ml  Output    929 ml  Net    151 ml    ABI's 01/11/14:  RIGHT    LEFT     PRESSURE  WAVEFORM   PRESSURE  WAVEFORM   BRACHIAL  128  Triphasic  BRACHIAL  127  Triphasic   DP   absent  DP   absent   AT   absent  AT   absent   PT  119  Monophasic  PT  117  Monophasic   PER  91  Monophasic  PER  94  Monophasic   GREAT TOE   NA  GREAT TOE   NA     RIGHT  LEFT   ABI  0.93  0.91      Assessment:  73 y.o. male is s/p:  #1 right common femoral and profundus femoris artery thrombectomy and endarterectomy  #2 right femoral to  posterior tibial bypass with saphenous vein harvested from left leg  2 Days Post-Op  Plan: -pt doing well with a palpable graft pulse. -he has been ambulating well -will discharge today after pt is seen by Dr. Annabell Sabal with Dr. Donnetta Hutching in 2-3 weeks -d/c dilaudid on discharge as pt should no longer require this as bypass graft is patent.  Rx given for Vicodin. -DVT prophylaxis:  Lovenox   Leontine Locket, PA-C [facility] [phone] 01/12/2014 7:35 AM

## 2014-01-12 NOTE — Telephone Encounter (Addendum)
Message copied by Gena Fray on Wed Jan 12, 2014 12:01 PM ------      Message from: Denman George      Created: Wed Jan 12, 2014 10:55 AM      Regarding: Steph log; and needs 2-3 wk f/u w/ [facility]                   ----- Message -----         From: Gabriel Earing, PA-C         Sent: 01/12/2014   7:52 AM           To: [facility] Charge Pool            S/p  #1 right common femoral and profundus femoris artery thrombectomy and endarterectomy      #2 right femoral to posterior tibial bypass with saphenous vein harvested from left leg      01/10/14.            F/u with Dr. Donnetta Hutching in 2-3 weeks.            Thanks ------  01/12/14: left message for pt re appt, dpm

## 2014-01-12 NOTE — Discharge Summary (Signed)
[facility] Discharge Summary  John Ball [birth year] 73 y.o. 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 male who was recently discharged from [facility] on 01/08/14. Dr. Scot Dock saw him on 01/06/14 after the patient presented with a painful leg and foot. He initially had a right femoral to above-knee popliteal bypass in April 2010. He then had a right above-knee to below-knee popliteal bypass in 2012. He had coronary bypass graft in 2004 using vein from his right leg. 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 Thrombolysis by interventional radiology was attempted but was successful due to inability to cannulate graft. He was motor and neurologically intact in his right foot and Dr. Scot Dock offered elective surgery as an outpatient. Vein mapping was performed on his left lower extremity. He was admitted for management of pain and discharged on 01/08/14. He was to follow-up in the [facility] clinic this week to discuss elective surgery.  He is here today because his pain is not well-controlled by pain medicine. He says the pain is the same and has not worsened. His motor and neuro sensation is still intact. He has rest pain in his foot and calf at night.  His past medical history includes: Hypertension treated with Altace, Hyperlipidemia not currently treated with statins. He is allergic to statins and Asprin. He is not on any antiplatelets or anticoagulants. He is a chronic tobacco abuser. He has had ABI's at the [place] within the last 6 months and was told his studies were normal.  He denies any chest pain or shortness of breath.   Hospital Course:  The patient was admitted to the hospital and taken to the operating room on 01/10/2014 and underwent: #1 right common femoral and  profundus femoris artery thrombectomy and endarterectomy  #2 right femoral to posterior tibial bypass with saphenous vein harvested from left leg     The pt tolerated the procedure well and was transported to the PACU in good condition. By POD 1, he was doing well with a palpable graft pulse.  He was transferred to the telemetry unit.  On POD 2, he is doing well.  Incisions are c/d/i with steri strips in place.  He does have a + palpable graft pulse and a palpable PT on the right.  He has been ambulating well.  ABI's on POD 1 are as follows:  RIGHT    LEFT     PRESSURE  WAVEFORM   PRESSURE  WAVEFORM   BRACHIAL  128  Triphasic  BRACHIAL  127  Triphasic   DP   absent  DP   absent   AT   absent  AT   absent   PT  119  Monophasic  PT  117  Monophasic   PER  91  Monophasic  PER  94  Monophasic   GREAT TOE   NA  GREAT TOE   NA     RIGHT  LEFT   ABI  0.93  0.91     The remainder of the hospital course consisted of increasing mobilization and increasing intake of solids without difficulty.  CBC    Component Value Date/Time   WBC 7.7 01/11/2014 0405   RBC 4.25 01/11/2014 0405   HGB 13.7 01/11/2014 0405   HCT 40.7 01/11/2014 0405  PLT 130* 01/11/2014 0405   MCV 95.8 01/11/2014 0405   MCH 32.2 01/11/2014 0405   MCHC 33.7 01/11/2014 0405   RDW 13.8 01/11/2014 0405   LYMPHSABS 1.8 01/06/2014 0954   MONOABS 0.3 01/06/2014 0954   EOSABS 0.2 01/06/2014 0954   BASOSABS 0.0 01/06/2014 0954    BMET    Component Value Date/Time   NA 141 01/11/2014 0405   K 4.1 01/11/2014 0405   CL 104 01/11/2014 0405   CO2 24 01/11/2014 0405   GLUCOSE 93 01/11/2014 0405   BUN 12 01/11/2014 0405   CREATININE 0.91 01/11/2014 0405   CALCIUM 8.6 01/11/2014 0405   GFRNONAA 84* 01/11/2014 0405   GFRAA >90 01/11/2014 0405     Discharge Instructions:   The patient is discharged with extensive instructions on wound care and progressive ambulation.  They are instructed not to drive or perform any heavy lifting until returning to see the physician in  his office.  Discharge Instructions   Call MD for:  redness, tenderness, or signs of infection (pain, swelling, bleeding, redness, odor or green/yellow discharge around incision site)    Complete by:  As directed      Call MD for:  severe or increased pain, loss or decreased feeling  in affected limb(s)    Complete by:  As directed      Call MD for:  temperature >100.5    Complete by:  As directed      Discharge wound care:    Complete by:  As directed   Wash the groin wound with soap and water daily and pat dry. (No tub bath-only shower)  Then put a dry gauze or washcloth there to keep this area dry daily and as needed.  Do not use Vaseline or neosporin on your incisions.  Only use soap and water on your incisions and then protect and keep dry.     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 Arterial occlusion 01/06/2014  . Lung cancer 11/02/2013  . PAD (peripheral artery disease) 08/12/2012   Past Medical History  Diagnosis Date  . Hypertension   . Hyperlipidemia   . COPD (chronic obstructive pulmonary disease)   . Depression   . Pain     legs and feet  . Coronary heart disease   . Lung nodules   . Cancer Oct 2012    Lung  . DVT (deep venous thrombosis)        Medication List    STOP taking these medications       HYDROmorphone 2 MG tablet  Commonly known as:  DILAUDID      TAKE these medications       ADVAIR DISKUS 250-50 MCG/DOSE Aepb  Generic drug:  Fluticasone-Salmeterol  Inhale 1 puff into the lungs every 12 (twelve) hours.     albuterol (5 MG/ML) 0.5% nebulizer solution  Commonly known as:  PROVENTIL  Take 2.5 mg by nebulization every 6 (six) hours  as needed for wheezing or shortness of breath.     AMBIEN CR 12.5  MG CR tablet  Generic drug:  zolpidem  Take 12.5 mg by mouth at bedtime.     clonazePAM 1 MG tablet  Commonly known as:  KLONOPIN  Take 1 mg by mouth 2 (two) times daily as needed for anxiety.     DULoxetine 60 MG capsule  Commonly known as:  CYMBALTA  Take 60 mg by mouth daily.     HYDROcodone-acetaminophen 5-325 MG per tablet  Commonly known as:  NORCO/VICODIN  Take 1-2 tablets by mouth every 4 (four) hours as needed for moderate pain.     prazosin 1 MG capsule  Commonly known as:  MINIPRESS  Take 1 mg by mouth at bedtime.     QUEtiapine 300 MG 24 hr tablet  Commonly known as:  SEROQUEL XR  Take 300 mg by mouth at bedtime.     ramipril 10 MG capsule  Commonly known as:  ALTACE  Take 10 mg by mouth daily.     tiotropium 18 MCG inhalation capsule  Commonly known as:  SPIRIVA  Place 18 mcg into inhaler and inhale daily.     traZODone 150 MG tablet  Commonly known as:  DESYREL  Take 150 mg by mouth at bedtime.       Discontinue dilaudid on discharge as pt should not require this with patent bypass graft.  Vicodin #30 No Refill  Disposition: home  Patient's condition: is Good  Follow up: 1. Dr. Donnetta Hutching in 2-3 weeks   Leontine Locket, PA-C [facility] [phone] 01/12/2014  7:53 AM  - For VQI Registry use --- Instructions: Press F2 to tab through selections.  Delete question if not applicable.   Post-op:  Wound infection: No  Graft infection: No  Transfusion: No  If yes, n/a units given New Arrhythmia: No Ipsilateral amputation: No, [ ]  Minor, [ ]  BKA, [ ]  AKA Discharge patency: [ x] Primary, [ ]  Primary assisted, [ ]  Secondary, [ ]  Occluded Patency judged by: [ ]  Dopper only, [ x] Palpable graft pulse, [ x] Palpable distal pulse, [ ]  ABI inc. > 0.15, [ ]  Duplex Discharge ABI: R 0.93, L 0.91 Discharge TBI: R , L  D/C Ambulatory Status: Ambulatory  Complications: MI:  No, [ ]  Troponin only, [ ]  EKG or Clinical CHF: No Resp failure:No, [ ]  Pneumonia, [ ]  Ventilator Chg in renal function: No, [ ]  Inc. Cr > 0.5, [ ]  Temp. Dialysis, [ ]  Permanent dialysis Stroke: No, [ ]  Minor, [ ]  Major Return to OR: No  Reason for return to OR: [ ]  Bleeding, [ ]  Infection, [ ]  Thrombosis, [ ]  Revision  Discharge medications: Statin use:  No-allergy ASA use:  No-allergy Plavix use:  no Beta blocker use: no Coumadin use: no

## 2014-01-17 NOTE — Discharge Summary (Signed)
Agree with plans for discharge.  Deitra Mayo, MD, [place] [phone] 01/17/2014

## 2014-01-19 ENCOUNTER — Telehealth: Payer: Self-pay

## 2014-01-19 ENCOUNTER — Ambulatory Visit (HOSPITAL_COMMUNITY)
Admission: RE | Admit: 2014-01-19 | Discharge: 2014-01-19 | Disposition: A | Discharge status: Home / Self Care | Payer: Medicare Other | Source: Ambulatory Visit | Attending: Vascular Surgery | Admitting: Vascular Surgery

## 2014-01-19 ENCOUNTER — Ambulatory Visit (INDEPENDENT_AMBULATORY_CARE_PROVIDER_SITE_OTHER): Payer: Self-pay | Admitting: Family

## 2014-01-19 ENCOUNTER — Ambulatory Visit (INDEPENDENT_AMBULATORY_CARE_PROVIDER_SITE_OTHER)
Admission: RE | Admit: 2014-01-19 | Discharge: 2014-01-19 | Disposition: A | Discharge status: Home / Self Care | Payer: Medicare Other | Source: Ambulatory Visit | Attending: Vascular Surgery | Admitting: Vascular Surgery

## 2014-01-19 ENCOUNTER — Other Ambulatory Visit: Payer: Self-pay | Admitting: Vascular Surgery

## 2014-01-19 ENCOUNTER — Encounter: Payer: Self-pay | Admitting: Family

## 2014-01-19 VITALS — BP 130/79 | HR 66 | Resp 20 | Ht 72.0 in | Wt 211.0 lb

## 2014-01-19 DIAGNOSIS — Z48812 Encounter for surgical aftercare following surgery on the circulatory system: Secondary | ICD-10-CM

## 2014-01-19 DIAGNOSIS — M7989 Other specified soft tissue disorders: Secondary | ICD-10-CM

## 2014-01-19 DIAGNOSIS — I739 Peripheral vascular disease, unspecified: Secondary | ICD-10-CM

## 2014-01-19 DIAGNOSIS — M79609 Pain in unspecified limb: Secondary | ICD-10-CM | POA: Insufficient documentation

## 2014-01-19 DIAGNOSIS — M79604 Pain in right leg: Secondary | ICD-10-CM

## 2014-01-19 NOTE — Telephone Encounter (Signed)
Pt's wife called to report onset of pain in right upper leg last night; reported pt. c/o feeling like his leg is on fire, from the right hip to the knee.  Stated he had not c/o any pain prior to last night.  C/o the right leg feels heavy, and has numbness in right knee and just above right knee.  The pt. Stated "there is something going on in my right leg."  Wife stated pt. c/o pain with standing.  Stated there is swelling from the right foot to the knee.  Reported the swelling seems to improve overnight.  Wife reports the right foot feels warm to touch.  Will discuss w/ MD in office, and return call.

## 2014-01-19 NOTE — Telephone Encounter (Signed)
Discussed with Dr. Scot Dock.  Recommended to schedule pt. for right lower extremity arterial duplex to check patency of bypass graft; if normal, then do a right lower extremity venous duplex to rule out DVT. If both tests are normal, then bring pt. back to f/u with Dr. Donnetta Hutching at some point.

## 2014-01-19 NOTE — Progress Notes (Signed)
[facility] [place] HISTORY AND PHYSICAL -PAD  History of Present Illness John Ball is a 73 y.o. male patient of Dr. Donnetta Hutching who is status post extensive prior femoropopliteal bypasses on his right leg. He had initially had a right femoral to above-knee popliteal bypass in April 2010. This was followed by right above-knee to below-knee popliteal bypass in August of 2011. He presented with occlusion of this and underwent thrombectomy and revision of his femoral-popliteal bypass in April of 2012.  He is also s/p right common femoral and profundus femoris artery thrombectomy and endarterectomy; right femoral to posterior tibial bypass with saphenous vein harvested from left leg on 01/10/14. He has a history of DVT. He returns today for new onset of right upper leg pain, started last night: burning, numbness, he denies injuring right leg.  He has had little pain until last night He had hallucinations last Thursday night. He walks very little due to his very small house and not walking outside; states he elevates his legs when not walking. He denies any history of stroke or TIA, denies history of MI, but did have a CABG.  The patient denies New Medical or Surgical History.  Pt Diabetic: No Pt smoker: smoker  (less than 1 ppd, started at age 47 yrs)  Pt meds include: Statin :No, statins caused ithching ASA: No, record indicates that ASA caused GI bleed, but wife states this is not the case, that he bruised easily taking 81 mg ASA daily Other anticoagulants/antiplatelets: no  Past Medical History  Diagnosis Date  . Hypertension   . Hyperlipidemia   . COPD (chronic obstructive pulmonary disease)   . Depression   . Pain     legs and feet  . Coronary heart disease   . Lung nodules   . Cancer Oct 2012    Lung  . DVT (deep venous thrombosis)     Social History History  Substance Use Topics  . Smoking status: Current Every Day Smoker -- 0.60 packs/day for 55 years     Types: Cigarettes  . Smokeless tobacco: Never Used  . Alcohol Use: No    Family History Family History  Problem Relation Age of Onset  . Other Mother 83    Abdominal Aortic Aneurysm   . Alzheimer's disease Father 34    Past Surgical History  Procedure Laterality Date  . Abdominal aortogram  09/16/2008    bilateral lower extremity runoff arteriography  . Extremity arteriogram  09/16/2008    right lower  . Right femoral popliteral bypass  10/24/2008    6-mm Gore Tex Graft  . Thrombectomy      right femoral to above knee popliteral Gore-Tex graft  . Angioplasty      Dacron patch of Gore-Tex and saphenour vein junction and above knee popliteral artery  . Heart bypass    . Femoral-tibial bypass graft Right 01/10/2014    Procedure: Right Femoral to Posterior Tibial Bypass Graft using Left Leg Vein, Thrombectomy Right Common Femoral and Profunda of Leg . ;  Surgeon: Rosetta Posner, MD;  Location: [facility];  Service: Vascular;  Laterality: Right;    Allergies  Allergen Reactions  . Aripiprazole     "makes him wild"  . Aspirin     Causes GI Bleed  . Crestor [Rosuvastatin Calcium] Itching  . Effexor [Venlafaxine Hydrochloride] Itching  . Lipitor [Atorvastatin Calcium] Itching  . Morphine And Related Itching  . Zocor [Simvastatin - High Dose] Itching  Current Outpatient Prescriptions  Medication Sig Dispense Refill  . albuterol (PROVENTIL) (5 MG/ML) 0.5% nebulizer solution Take 2.5 mg by nebulization every 6 (six) hours as needed for wheezing or shortness of breath.      . clonazePAM (KLONOPIN) 1 MG tablet Take 1 mg by mouth 2 (two) times daily as needed for anxiety.      . DULoxetine (CYMBALTA) 60 MG capsule Take 60 mg by mouth daily.       . Fluticasone-Salmeterol (ADVAIR DISKUS) 250-50 MCG/DOSE AEPB Inhale 1 puff into the lungs every 12 (twelve) hours.      Marland Kitchen HYDROcodone-acetaminophen (NORCO/VICODIN) 5-325 MG per tablet Take 1-2 tablets by mouth every 4 (four) hours as needed for  moderate pain.  30 tablet  0  . prazosin (MINIPRESS) 1 MG capsule Take 1 mg by mouth at bedtime.      Marland Kitchen QUEtiapine (SEROQUEL XR) 300 MG 24 hr tablet Take 300 mg by mouth at bedtime.       . ramipril (ALTACE) 10 MG capsule Take 10 mg by mouth daily.      Marland Kitchen tiotropium (SPIRIVA) 18 MCG inhalation capsule Place 18 mcg into inhaler and inhale daily.      . traZODone (DESYREL) 150 MG tablet Take 150 mg by mouth at bedtime.      Marland Kitchen zolpidem (AMBIEN CR) 12.5 MG CR tablet Take 12.5 mg by mouth at bedtime.         No current facility-administered medications for this visit.    ROS: See HPI for pertinent positives and negatives.   Physical Examination  Filed Vitals:   01/19/14 1409  BP: 130/79  Pulse: 66  Resp: 20    General: A&O x 3, WDWN. Gait: limp Eyes: Pupils are = Pulmonary: CTAB, without wheezes , rales or rhonchi. Cardiac: regular Rythm , without detected murmur.         Carotid Bruits Left Right   Negative Negative  Aorta is not palpable. Radial pulses: are 1+ palpable                           VASCULAR EXAM: Extremities without ischemic changes  without Gangrene; with open surgical incision right medial thigh, about 2 cm in length, 1 cm in width incision dehiscence, to dermal layer, surrounded by moderate erythema, scant serous drainage. Left leg and other right leg incisions healing well, well proximated. 2-3+ pitting and non pitting edema in right foot and lower leg, 2+ in left lower leg and foot.                                                                                                          LE Pulses LEFT RIGHT       FEMORAL   palpable   palpable        POPLITEAL  not palpable   not palpable       POSTERIOR TIBIAL  biphasic by Doppler and not palpable   biphasic by Doppler and not palpable  DORSALIS PEDIS      ANTERIOR TIBIAL non-Dopplerable and not palpable  non-Dopplerable and not palpable    Abdomen: soft, NT, no masses. Skin: no rashes, no  ulcers noted, see extremities. Musculoskeletal: no muscle wasting or atrophy.  Neurologic: A&O X 3; Appropriate Affect ; SENSATION: normal; MOTOR FUNCTION:  moving all extremities equally, motor strength 5/5 throughout. Speech is fluent/normal. CN 2-12 intact.   Non-Invasive Vascular Imaging: DATE: 01/19/2014 LOWER EXTREMITY VENOUS DUPLEX EVALUATION    INDICATION: Right lower extremity edema    PREVIOUS INTERVENTION(S): Right common femoral to posterior tibial bypass graft with saphenous vein from left leg 01/10/2014; Right femoral to popliteal bypass graft 10/24/2008 with thrombectomy; Right above knee to below knee popliteal bypass graft 03/02/2010    DUPLEX EXAM:      Common Femoral Vein  Femoral Vein Popliteal Vein Posterior Tibial Vein  Peroneal Vein Great Saphenous Vein   Right Left Right Left Right Left Right Left Right Left Right Left  Spontaneous + + +  +  +  +  Harvest   Phasic + + +  +  +  +     Compressible + + +  +  +  +     Augmentation + + +  +  +  +     Competent NA NA NA  NA  +  +        Legend:  + = Yes, -  = No; P = Partial, D = Decreased, NV = Not Visualized, NA = Not Examined    Thrombosis - - -  -  -  -   -                                                        Legend:  A = Acute, C = Chronic, O = Obstructive, P = Partially Obstructive     ADDITIONAL FINDINGS:     IMPRESSION: 1. Technically difficult exam due to extreme edema from recent surgery 2. No evidence of right lower extremity acute deep vein thrombosis.    LOWER EXTREMITY ARTERIAL DUPLEX EVALUATION    INDICATION: Right lower extremity pain    PREVIOUS INTERVENTION(S): Right common femoral to posterior tibial bypass graft with saphenous vein from left leg 01/10/2014; Right femoral to popliteal bypass graft 10/24/2008 with thrombectomy; right above knee to below knee popliteal bypass graft 03/02/2010.    DUPLEX EXAM: Right lower extremity bypass evaluation.    RIGHT  LEFT   Peak Systolic Velocity  (cm/s) Ratio (if abnormal) Waveform  Peak Systolic Velocity (cm/s) Ratio (if abnormal) Waveform  66  M brisk Inflow Artery     64  B Proximal Anastomosis     56  B damp Proximal Graft     123  M brisk Mid Graft     72  M  Distal Graft     79  M Distal Anastomosis     144  M brisk Outflow Artery     Not performed Today's ABI / TBI Not performed   Previous ABI / TBI (  )     Waveform:    M - Monophasic       B - Biphasic       T - Triphasic  If Ankle Brachial Index (ABI) or Toe Brachial Index (  TBI) performed, please see complete report     ADDITIONAL FINDINGS:     IMPRESSION: 1. Patent right common femoral to posterior tibial artery bypass graft with no evidence for restenosis.    Compared to the previous exam:  No recent exam.     ASSESSMENT: DESTAN FRANCHINI is a 73 y.o. male who is status post extensive prior femoropopliteal bypasses on his right leg. He had initially had a right femoral to above-knee popliteal bypass in April 2010. This was followed by right above-knee to below-knee popliteal bypass in August of 2011. He presented with occlusion of this and underwent thrombectomy and revision of his femoral-popliteal bypass in April of 2012.  He is also s/p right common femoral and profundus femoris artery thrombectomy and endarterectomy; right femoral to posterior tibial bypass with saphenous vein harvested from left leg on 01/10/14. Venous Duplex today shows no evidence of right lower extremity DVT. Right lower extremity arterial Duplex today indicates patent right common femoral to posterior tibial artery bypass graft with no evidence for restenosis.  Dr. Scot Dock spoke with pt and wife, and examined pt. He is not diabetic but continues to smoke; he was counseled re smoking cessation.   PLAN:   I discussed in depth with the patient the nature of atherosclerosis, and emphasized the importance of maximal medical management including strict control of blood pressure, blood glucose,  and lipid levels, obtaining regular exercise, and cessation of smoking.  The patient is aware that without maximal medical management the underlying atherosclerotic disease process will progress, limiting the benefit of any interventions.  Based on the patient's vascular studies and examination, pt will return to clinic as scheduled on 7/28 with Dr. Donnetta Hutching.  The patient was given information about PAD including signs, symptoms, treatment, what symptoms should prompt the patient to seek immediate medical care, and risk reduction measures to take.  Clemon Chambers, RN, MSN, FNP-C [facility] Phone: [phone]  Clinic MD: Scot Dock  01/19/2014 2:29 PM

## 2014-01-20 ENCOUNTER — Other Ambulatory Visit (HOSPITAL_COMMUNITY): Payer: Medicare Other

## 2014-01-31 ENCOUNTER — Encounter: Payer: Self-pay | Admitting: Vascular Surgery

## 2014-02-01 ENCOUNTER — Encounter: Payer: Self-pay | Admitting: Vascular Surgery

## 2014-02-01 ENCOUNTER — Ambulatory Visit (INDEPENDENT_AMBULATORY_CARE_PROVIDER_SITE_OTHER): Payer: Self-pay | Admitting: Vascular Surgery

## 2014-02-01 VITALS — BP 120/7 | HR 80 | Temp 97.9°F | Ht 72.0 in | Wt 239.0 lb

## 2014-02-01 DIAGNOSIS — M79605 Pain in left leg: Secondary | ICD-10-CM | POA: Insufficient documentation

## 2014-02-01 DIAGNOSIS — M79609 Pain in unspecified limb: Secondary | ICD-10-CM

## 2014-02-01 DIAGNOSIS — I739 Peripheral vascular disease, unspecified: Secondary | ICD-10-CM

## 2014-02-01 NOTE — Progress Notes (Signed)
Here today for followup of recent revascularization right leg. He has a long history of bypass his right leg dating back to 2010 with Dr. Amedeo Plenty. He is a prosthetic graft that time has had multiple revisions. He presented with severe ischemia with limb threatening problem Zeriyah Wain July. He underwent extensive common femoral profundus thrombectomy and endarterectomy and then a right femoral to posterior tibial bypass with vein harvested from his left leg on 01/10/2014. He did well the hospital. He does continue to have some postoperative pain. This is some nerve irritation of his anterior thigh and this appears to be stable. He does have some drainage from his right groin and some of the vein harvest incisions from a stitch abscess from the Vicryl suture. He is walking without difficulty in his mode his yard several times overriding or.  Physical exam he does have some slight drainage and had the Vicryl suture exposed in the groin this was removed. No evidence of deep infection. Does have erythema on several of the skin harvest incisions on the left leg. Easily palpable subcutaneous stem posterior tibial graft pulse with several perfused right foot  Underwent vascular lab studies on 01/19/2014 showing widely patent graft with normal flow in his right foot  Stable overall. I have started him on Keflex 500 mg by mouth 3 times a day for 2 weeks due to the erythema at the incisions. He will continue usual activity we'll see him back in 3-4 weeks for continued followup

## 2014-02-25 ENCOUNTER — Other Ambulatory Visit (HOSPITAL_COMMUNITY): Payer: Self-pay | Admitting: Respiratory Therapy

## 2014-02-25 DIAGNOSIS — I739 Peripheral vascular disease, unspecified: Secondary | ICD-10-CM | POA: Diagnosis not present

## 2014-02-25 DIAGNOSIS — G4700 Insomnia, unspecified: Secondary | ICD-10-CM | POA: Diagnosis not present

## 2014-02-25 DIAGNOSIS — J449 Chronic obstructive pulmonary disease, unspecified: Secondary | ICD-10-CM | POA: Diagnosis not present

## 2014-02-25 DIAGNOSIS — J441 Chronic obstructive pulmonary disease with (acute) exacerbation: Secondary | ICD-10-CM

## 2014-02-28 ENCOUNTER — Encounter: Payer: Self-pay | Admitting: Vascular Surgery

## 2014-03-01 ENCOUNTER — Encounter: Payer: Self-pay | Admitting: Vascular Surgery

## 2014-03-01 ENCOUNTER — Ambulatory Visit (INDEPENDENT_AMBULATORY_CARE_PROVIDER_SITE_OTHER): Payer: Medicare Other | Admitting: Vascular Surgery

## 2014-03-01 VITALS — BP 134/70 | HR 77 | Ht 72.0 in | Wt 214.8 lb

## 2014-03-01 DIAGNOSIS — I739 Peripheral vascular disease, unspecified: Secondary | ICD-10-CM

## 2014-03-01 NOTE — Addendum Note (Signed)
Addended by: Mena Goes on: 03/01/2014 03:52 PM   Modules accepted: Orders

## 2014-03-01 NOTE — Progress Notes (Signed)
     Postoperative Visit Bypass Surgery Date of Surgery: 01/10/2014 Surgeon:Dr. Maegan Buller  History of Present Illness  John Ball is a 73 y.o. male who presents for postoperative follow-up QAS:TMHDQ common femoral and profundus femoris artery thrombectomy and endarterectomy and right femoral to posterior tibial bypass with saphenous vein harvested from left leg.  The patient's wounds are clean, dry, intact and healed.   The right groin has healed without drainage at this time.   Physical Examination  Filed Vitals:   03/01/14 1331  BP: 134/70  Pulse: 77    Incisions are clean, well healed and dry. Distal vein harvest site has small compressible fluid edema.  No erythema and non tender to palpation. Feet have moderate edema.  Sensation is grossly intact and equal bil. Palpable femoral pulses bil. Groins soft without hematoma.   Medical Decision Making  John Ball is a 73 y.o. year old male who presents s/p right lower extremity bypass surgery .  The patient's bypass incisions are clean, dry, intact or healed with good resolution of pre-operative symptoms. The patient's surveillance will included ABI and bypass duplex studies which will be  in: 3 months.  The edema may take 6 weeks to 3 months to subside.  The lateral right thigh pain will subside over time as well. The patient was seen in clinic by Dr. Donnetta Hutching.  Clinic MD: Dr. Donnetta Hutching   I have examined the patient, reviewed and agree with above. Continues to do well with no further rest pain. Moderate swelling at the end of the day. We'll continue his walking program and see [place] in 3  Blondell Laperle, MD 03/01/2014 2:26 PM

## 2014-03-04 ENCOUNTER — Ambulatory Visit (HOSPITAL_COMMUNITY)
Admission: RE | Admit: 2014-03-04 | Discharge: 2014-03-04 | Disposition: A | Discharge status: Home / Self Care | Payer: Medicare Other | Source: Ambulatory Visit | Attending: Family Medicine | Admitting: Family Medicine

## 2014-03-04 DIAGNOSIS — J441 Chronic obstructive pulmonary disease with (acute) exacerbation: Secondary | ICD-10-CM | POA: Diagnosis not present

## 2014-03-04 LAB — PULMONARY FUNCTION TEST
DL/VA % pred: 56 %
DL/VA: 2.58 ml/min/mmHg/L
DLCO COR: 12.29 ml/min/mmHg
DLCO UNC: 12.29 ml/min/mmHg
DLCO cor % pred: 38 %
DLCO unc % pred: 38 %
FEF 25-75 PRE: 0.93 L/s
FEF2575-%Pred-Pre: 38 %
FEV1-%Pred-Pre: 56 %
FEV1-Pre: 1.83 L
FEV1FVC-%Pred-Pre: 81 %
FEV6-%PRED-PRE: 71 %
FEV6-Pre: 2.97 L
FEV6FVC-%PRED-PRE: 104 %
FVC-%PRED-PRE: 70 %
FVC-Pre: 3.08 L
Pre FEV1/FVC ratio: 59 %
Pre FEV6/FVC Ratio: 98 %

## 2014-04-29 DIAGNOSIS — G894 Chronic pain syndrome: Secondary | ICD-10-CM | POA: Diagnosis not present

## 2014-04-29 DIAGNOSIS — Z23 Encounter for immunization: Secondary | ICD-10-CM | POA: Diagnosis not present

## 2014-04-29 DIAGNOSIS — G609 Hereditary and idiopathic neuropathy, unspecified: Secondary | ICD-10-CM | POA: Diagnosis not present

## 2014-04-29 DIAGNOSIS — J449 Chronic obstructive pulmonary disease, unspecified: Secondary | ICD-10-CM | POA: Diagnosis not present

## 2014-04-29 DIAGNOSIS — I10 Essential (primary) hypertension: Secondary | ICD-10-CM | POA: Diagnosis not present

## 2014-05-02 ENCOUNTER — Other Ambulatory Visit (HOSPITAL_BASED_OUTPATIENT_CLINIC_OR_DEPARTMENT_OTHER): Payer: Medicare Other

## 2014-05-02 ENCOUNTER — Ambulatory Visit (HOSPITAL_COMMUNITY)
Admission: RE | Admit: 2014-05-02 | Discharge: 2014-05-02 | Disposition: A | Discharge status: Home / Self Care | Payer: Medicare Other | Source: Ambulatory Visit | Attending: Internal Medicine | Admitting: Internal Medicine

## 2014-05-02 ENCOUNTER — Encounter (HOSPITAL_COMMUNITY): Payer: Self-pay

## 2014-05-02 DIAGNOSIS — Z85118 Personal history of other malignant neoplasm of bronchus and lung: Secondary | ICD-10-CM

## 2014-05-02 DIAGNOSIS — C3490 Malignant neoplasm of unspecified part of unspecified bronchus or lung: Secondary | ICD-10-CM

## 2014-05-02 DIAGNOSIS — R918 Other nonspecific abnormal finding of lung field: Secondary | ICD-10-CM | POA: Diagnosis not present

## 2014-05-02 LAB — CBC WITH DIFFERENTIAL/PLATELET
BASO%: 0.7 % (ref 0.0–2.0)
BASOS ABS: 0 10*3/uL (ref 0.0–0.1)
EOS ABS: 0.2 10*3/uL (ref 0.0–0.5)
EOS%: 3.9 % (ref 0.0–7.0)
HCT: 43.9 % (ref 38.4–49.9)
HEMOGLOBIN: 14.4 g/dL (ref 13.0–17.1)
LYMPH#: 1.6 10*3/uL (ref 0.9–3.3)
LYMPH%: 30.1 % (ref 14.0–49.0)
MCH: 31.4 pg (ref 27.2–33.4)
MCHC: 32.7 g/dL (ref 32.0–36.0)
MCV: 96.1 fL (ref 79.3–98.0)
MONO#: 0.4 10*3/uL (ref 0.1–0.9)
MONO%: 7.5 % (ref 0.0–14.0)
NEUT%: 57.8 % (ref 39.0–75.0)
NEUTROS ABS: 3 10*3/uL (ref 1.5–6.5)
Platelets: 142 10*3/uL (ref 140–400)
RBC: 4.57 10*6/uL (ref 4.20–5.82)
RDW: 14.1 % (ref 11.0–14.6)
WBC: 5.2 10*3/uL (ref 4.0–10.3)

## 2014-05-02 LAB — COMPREHENSIVE METABOLIC PANEL (CC13)
ALT: 14 U/L (ref 0–55)
ANION GAP: 8 meq/L (ref 3–11)
AST: 14 U/L (ref 5–34)
Albumin: 3.8 g/dL (ref 3.5–5.0)
Alkaline Phosphatase: 61 U/L (ref 40–150)
BUN: 15.7 mg/dL (ref 7.0–26.0)
CALCIUM: 9.5 mg/dL (ref 8.4–10.4)
CHLORIDE: 107 meq/L (ref 98–109)
CO2: 24 mEq/L (ref 22–29)
Creatinine: 1.1 mg/dL (ref 0.7–1.3)
Glucose: 101 mg/dl (ref 70–140)
Potassium: 4.1 mEq/L (ref 3.5–5.1)
Sodium: 139 mEq/L (ref 136–145)
Total Bilirubin: 0.4 mg/dL (ref 0.20–1.20)
Total Protein: 6.7 g/dL (ref 6.4–8.3)

## 2014-05-02 MED ORDER — IOHEXOL 300 MG/ML  SOLN
80.0000 mL | Freq: Once | INTRAMUSCULAR | Status: AC | PRN
  Administered 2014-05-02: 80 mL via INTRAVENOUS

## 2014-05-08 HISTORY — PX: NM MYOVIEW LTD: HXRAD82

## 2014-05-09 ENCOUNTER — Encounter: Payer: Self-pay | Admitting: Internal Medicine

## 2014-05-09 ENCOUNTER — Ambulatory Visit (HOSPITAL_BASED_OUTPATIENT_CLINIC_OR_DEPARTMENT_OTHER): Payer: Medicare Other | Admitting: Internal Medicine

## 2014-05-09 VITALS — BP 119/61 | HR 78 | Temp 97.9°F | Resp 21 | Ht 72.0 in | Wt 215.1 lb

## 2014-05-09 DIAGNOSIS — C3431 Malignant neoplasm of lower lobe, right bronchus or lung: Secondary | ICD-10-CM | POA: Diagnosis not present

## 2014-05-09 DIAGNOSIS — C3411 Malignant neoplasm of upper lobe, right bronchus or lung: Secondary | ICD-10-CM

## 2014-05-09 NOTE — Progress Notes (Signed)
[facility] [phone]   [phone]  OFFICE PROGRESS NOTE  John Ball, IBTEHAL, John Ball [address]  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  PRIOR THERAPY: status post right lower lobe wedge resection for stage IA non-small cell lung cancer in October of 2012 at [facility].  CURRENT THERAPY:Observation.  INTERVAL HISTORY: John Ball 73 y.o. male returns to the clinic today for six-month follow-up visit accompanied by his wife. The patient is feeling fine today with no specific complaints. He denied having any significant chest pain, shortness of breath, cough or hemoptysis. He has no nausea or vomiting, no fever or chills. He has no weight loss or night sweats. He had repeat CT scan of the chest performed recently and he is here for evaluation and discussion of his scan results.  MEDICAL HISTORY: Past Medical History  Diagnosis Date  . Hypertension   . Hyperlipidemia   . COPD (chronic obstructive pulmonary disease)   . Depression   . Pain     legs and feet  . Coronary heart disease   . Lung nodules   . Cancer Oct 2012    Lung  . DVT (deep venous thrombosis)     ALLERGIES:  is allergic to aripiprazole; aspirin; crestor; effexor; lipitor; morphine and related; and zocor.  MEDICATIONS:  Current Outpatient Prescriptions  Medication Sig Dispense Refill  . albuterol (PROVENTIL) (5 MG/ML) 0.5% nebulizer solution Take 2.5 mg by nebulization every 6 (six) hours as needed for wheezing or shortness of breath.    . clonazePAM (KLONOPIN) 1 MG tablet Take 1 mg by mouth 2 (two) times daily as needed for anxiety.    . DULoxetine (CYMBALTA) 60 MG capsule Take 60 mg by mouth daily.     . Fluticasone-Salmeterol (ADVAIR DISKUS) 250-50 MCG/DOSE AEPB Inhale 1 puff into the lungs every 12 (twelve) hours.    Marland Kitchen HYDROcodone-acetaminophen  (NORCO/VICODIN) 5-325 MG per tablet Take 1-2 tablets by mouth every 4 (four) hours as needed for moderate pain. 30 tablet 0  . prazosin (MINIPRESS) 1 MG capsule Take 1 mg by mouth at bedtime.    Marland Kitchen QUEtiapine (SEROQUEL XR) 300 MG 24 hr tablet Take 300 mg by mouth at bedtime.     . ramipril (ALTACE) 10 MG capsule Take 10 mg by mouth daily.    Marland Kitchen tiotropium (SPIRIVA) 18 MCG inhalation capsule Place 18 mcg into inhaler and inhale daily.    . traZODone (DESYREL) 150 MG tablet Take 150 mg by mouth at bedtime.    Marland Kitchen zolpidem (AMBIEN CR) 12.5 MG CR tablet Take 12.5 mg by mouth at bedtime.       No current facility-administered medications for this visit.    SURGICAL HISTORY:  Past Surgical History  Procedure Laterality Date  . Abdominal aortogram  09/16/2008    bilateral lower extremity runoff arteriography  . Extremity arteriogram  09/16/2008    right lower  . Right femoral popliteral bypass  10/24/2008    6-mm Gore Tex Graft  . Thrombectomy      right femoral to above knee popliteral Gore-Tex graft  . Angioplasty      Dacron patch of Gore-Tex and saphenour vein junction and above knee popliteral artery  . Heart bypass    . Femoral-tibial bypass graft Right 01/10/2014    Procedure: Right Femoral to Posterior Tibial Bypass Graft using Left  Leg Vein, Thrombectomy Right Common Femoral and Profunda of Leg . ;  Surgeon: Rosetta Posner, John Ball;  Location: [facility] OR;  Service: Vascular;  Laterality: Right;    REVIEW OF SYSTEMS:  A comprehensive review of systems was negative.   PHYSICAL EXAMINATION: General appearance: alert, cooperative and no distress Head: Normocephalic, without obvious abnormality, atraumatic Neck: no adenopathy, no JVD, supple, symmetrical, trachea midline and thyroid not enlarged, symmetric, no tenderness/mass/nodules Lymph nodes: Cervical, supraclavicular, and axillary nodes normal. Resp: clear to auscultation bilaterally Back: symmetric, no curvature. ROM normal. No CVA  tenderness. Cardio: regular rate and rhythm, S1, S2 normal, no murmur, click, rub or gallop GI: soft, non-tender; bowel sounds normal; no masses,  no organomegaly Extremities: extremities normal, atraumatic, no cyanosis or edema  ECOG PERFORMANCE STATUS: 1 - Symptomatic but completely ambulatory  Blood pressure 119/61, pulse 78, temperature 97.9 F (36.6 C), temperature source Oral, resp. rate 21, height 6' (1.829 m), weight 215 lb 1.6 oz (97.569 kg), SpO2 97 %.  LABORATORY DATA: Lab Results  Component Value Date   WBC 5.2 05/02/2014   HGB 14.4 05/02/2014   HCT 43.9 05/02/2014   MCV 96.1 05/02/2014   PLT 142 05/02/2014      Chemistry      Component Value Date/Time   NA 139 05/02/2014 1156   NA 141 01/11/2014 0405   K 4.1 05/02/2014 1156   K 4.1 01/11/2014 0405   CL 104 01/11/2014 0405   CO2 24 05/02/2014 1156   CO2 24 01/11/2014 0405   BUN 15.7 05/02/2014 1156   BUN 12 01/11/2014 0405   CREATININE 1.1 05/02/2014 1156   CREATININE 0.91 01/11/2014 0405      Component Value Date/Time   CALCIUM 9.5 05/02/2014 1156   CALCIUM 8.6 01/11/2014 0405   ALKPHOS 61 05/02/2014 1156   ALKPHOS 66 01/10/2014 1309   AST 14 05/02/2014 1156   AST 17 01/10/2014 1309   ALT 14 05/02/2014 1156   ALT 15 01/10/2014 1309   BILITOT 0.40 05/02/2014 1156   BILITOT <0.2* 01/10/2014 1309       RADIOGRAPHIC STUDIES: Ct Chest W Contrast  05/02/2014   CLINICAL DATA:  Followup lung cancer.  EXAM: CT CHEST WITH CONTRAST  TECHNIQUE: Multidetector CT imaging of the chest was performed during intravenous contrast administration.  CONTRAST:  35mL OMNIPAQUE IOHEXOL 300 MG/ML  SOLN  COMPARISON:  03/22/2011  FINDINGS: Mediastinum: Normal heart size. No pericardial effusion. Calcified atherosclerotic disease involves the thoracic aorta and the coronary arteries. Previous median sternotomy and CABG procedure noted. Right hilar lymph node measures 1.7 cm, image 31/ series 2. Previously 1.4 cm. No mediastinal  adenopathy identified.  Lungs/Pleura: No pleural effusion identified. Moderate changes of centrilobular emphysema. Indeterminate nodular density in the left lower lobe measures 8 mm, image 37/series 5, new from previous exam. Post treatment changes from wedge resection surgery noted in the right lower lung. There is no evidence to suggest local tumor recurrence within the right lung.  Upper Abdomen: Incidental imaging through the upper abdomen is unremarkable. The adrenal glands both appear normal. The visualized portions of the liver and spleen are normal.  Musculoskeletal: Spondylosis noted within the thoracic spine. No aggressive lytic or sclerotic bone lesions.  IMPRESSION: 1. No acute cardiopulmonary abnormalities. 2. Stable appearance of the right lung status post wedge resection surgery. There is an enlarged right hilar lymph node which is slightly increased in size from previous exam. This could be better assessed with PET-CT. 3. Indeterminate nodular density  in the left lower lobe is likely postinflammatory. Attention on follow-up exam recommended.   Electronically Signed   By: Kerby Moors M.D.   On: 05/02/2014 14:25    ASSESSMENT AND PLAN: this is a very pleasant 73 years old white male with history of stage IA non-small cell lung cancer status post right lower lobe wedge resection in October 2012. The patient has been observation since that time and he has no evidence for disease recurrence on the recent CT scan of the chest. I discussed the scan results with the patient and his wife. I recommended for him to continue on observation with repeat CT scan of the chest in one year. He was advised to call immediately if he has any concerning symptoms in the interval. The patient voices understanding of current disease status and treatment options and is in agreement with the current care plan.  All questions were answered. The patient knows to call the clinic with any problems, questions or  concerns. We can certainly see the patient much sooner if necessary.  Disclaimer: This note was dictated with voice recognition software. Similar sounding words can inadvertently be transcribed and may not be corrected upon review.

## 2014-05-09 NOTE — Patient Instructions (Signed)
Smoking Cessation, Tips for Success  If you are ready to quit smoking, congratulations! You have chosen to help yourself be healthier. Cigarettes bring nicotine, tar, carbon monoxide, and other irritants into your body. Your lungs, heart, and blood vessels will be able to work better without these poisons. There are many different ways to quit smoking. Nicotine gum, nicotine patches, a nicotine inhaler, or nicotine nasal spray can help with physical craving. Hypnosis, support groups, and medicines help break the habit of smoking.  WHAT THINGS CAN I DO TO MAKE QUITTING EASIER?   Here are some tips to help you quit for good:  · Pick a date when you will quit smoking completely. Tell all of your friends and family about your plan to quit on that date.  · Do not try to slowly cut down on the number of cigarettes you are smoking. Pick a quit date and quit smoking completely starting on that day.  · Throw away all cigarettes.    · Clean and remove all ashtrays from your home, work, and car.  · On a card, write down your reasons for quitting. Carry the card with you and read it when you get the urge to smoke.  · Cleanse your body of nicotine. Drink enough water and fluids to keep your urine clear or pale yellow. Do this after quitting to flush the nicotine from your body.  · Learn to predict your moods. Do not let a bad situation be your excuse to have a cigarette. Some situations in your life might tempt you into wanting a cigarette.  · Never have "just one" cigarette. It leads to wanting another and another. Remind yourself of your decision to quit.  · Change habits associated with smoking. If you smoked while driving or when feeling stressed, try other activities to replace smoking. Stand up when drinking your coffee. Brush your teeth after eating. Sit in a different chair when you read the paper. Avoid alcohol while trying to quit, and try to drink fewer caffeinated beverages. Alcohol and caffeine may urge you to  smoke.  · Avoid foods and drinks that can trigger a desire to smoke, such as sugary or spicy foods and alcohol.  · Ask people who smoke not to smoke around you.  · Have something planned to do right after eating or having a cup of coffee. For example, plan to take a walk or exercise.  · Try a relaxation exercise to calm you down and decrease your stress. Remember, you may be tense and nervous for the first 2 weeks after you quit, but this will pass.  · Find new activities to keep your hands busy. Play with a pen, coin, or rubber band. Doodle or draw things on paper.  · Brush your teeth right after eating. This will help cut down on the craving for the taste of tobacco after meals. You can also try mouthwash.    · Use oral substitutes in place of cigarettes. Try using lemon drops, carrots, cinnamon sticks, or chewing gum. Keep them handy so they are available when you have the urge to smoke.  · When you have the urge to smoke, try deep breathing.  · Designate your home as a nonsmoking area.  · If you are a heavy smoker, ask your health care provider about a prescription for nicotine chewing gum. It can ease your withdrawal from nicotine.  · Reward yourself. Set aside the cigarette money you save and buy yourself something nice.  · Look for   support from others. Join a support group or smoking cessation program. Ask someone at home or at work to help you with your plan to quit smoking.  · Always ask yourself, "Do I need this cigarette or is this just a reflex?" Tell yourself, "Today, I choose not to smoke," or "I do not want to smoke." You are reminding yourself of your decision to quit.  · Do not replace cigarette smoking with electronic cigarettes (commonly called e-cigarettes). The safety of e-cigarettes is unknown, and some may contain harmful chemicals.  · If you relapse, do not give up! Plan ahead and think about what you will do the next time you get the urge to smoke.  HOW WILL I FEEL WHEN I QUIT SMOKING?  You  may have symptoms of withdrawal because your body is used to nicotine (the addictive substance in cigarettes). You may crave cigarettes, be irritable, feel very hungry, cough often, get headaches, or have difficulty concentrating. The withdrawal symptoms are only temporary. They are strongest when you first quit but will go away within 10-14 days. When withdrawal symptoms occur, stay in control. Think about your reasons for quitting. Remind yourself that these are signs that your body is healing and getting used to being without cigarettes. Remember that withdrawal symptoms are easier to treat than the major diseases that smoking can cause.   Even after the withdrawal is over, expect periodic urges to smoke. However, these cravings are generally short lived and will go away whether you smoke or not. Do not smoke!  WHAT RESOURCES ARE AVAILABLE TO HELP ME QUIT SMOKING?  Your health care provider can direct you to community resources or hospitals for support, which may include:  · Group support.  · Education.  · Hypnosis.  · Therapy.  Document Released: 03/22/2004 Document Revised: 11/08/2013 Document Reviewed: 12/10/2012  ExitCare® Patient Information ©2015 [employer]. This information is not intended to replace advice given to you by your health care provider. Make sure you discuss any questions you have with your health care provider.

## 2014-05-31 ENCOUNTER — Other Ambulatory Visit (HOSPITAL_COMMUNITY): Payer: Medicare Other

## 2014-05-31 ENCOUNTER — Ambulatory Visit: Payer: Medicare Other | Admitting: Vascular Surgery

## 2014-06-06 ENCOUNTER — Ambulatory Visit (INDEPENDENT_AMBULATORY_CARE_PROVIDER_SITE_OTHER): Payer: Medicare Other | Admitting: Cardiology

## 2014-06-06 ENCOUNTER — Encounter: Payer: Self-pay | Admitting: Cardiology

## 2014-06-06 ENCOUNTER — Telehealth: Payer: Self-pay | Admitting: Cardiovascular Disease

## 2014-06-06 VITALS — BP 130/84 | HR 77 | Ht 72.0 in | Wt 213.1 lb

## 2014-06-06 DIAGNOSIS — E785 Hyperlipidemia, unspecified: Secondary | ICD-10-CM | POA: Insufficient documentation

## 2014-06-06 DIAGNOSIS — I10 Essential (primary) hypertension: Secondary | ICD-10-CM | POA: Insufficient documentation

## 2014-06-06 DIAGNOSIS — F17210 Nicotine dependence, cigarettes, uncomplicated: Secondary | ICD-10-CM

## 2014-06-06 DIAGNOSIS — J449 Chronic obstructive pulmonary disease, unspecified: Secondary | ICD-10-CM | POA: Insufficient documentation

## 2014-06-06 DIAGNOSIS — Z720 Tobacco use: Secondary | ICD-10-CM | POA: Insufficient documentation

## 2014-06-06 DIAGNOSIS — R079 Chest pain, unspecified: Secondary | ICD-10-CM | POA: Diagnosis not present

## 2014-06-06 DIAGNOSIS — I2510 Atherosclerotic heart disease of native coronary artery without angina pectoris: Secondary | ICD-10-CM

## 2014-06-06 DIAGNOSIS — F17200 Nicotine dependence, unspecified, uncomplicated: Secondary | ICD-10-CM | POA: Insufficient documentation

## 2014-06-06 DIAGNOSIS — I779 Disorder of arteries and arterioles, unspecified: Secondary | ICD-10-CM | POA: Insufficient documentation

## 2014-06-06 DIAGNOSIS — Z951 Presence of aortocoronary bypass graft: Secondary | ICD-10-CM

## 2014-06-06 MED ORDER — ISOSORBIDE MONONITRATE ER 30 MG PO TB24: 30.0000 mg | ORAL_TABLET | Freq: Every day | ORAL | Status: DC

## 2014-06-06 MED ORDER — NITROGLYCERIN 0.4 MG SL SUBL: 0.4000 mg | SUBLINGUAL_TABLET | SUBLINGUAL | Status: DC | PRN

## 2014-06-06 NOTE — Progress Notes (Signed)
PATIENT: John Ball MRN: [id number] DOB: May 18, 1941 PCP: Cloyd Stagers, MD  Clinic Note: Chief Complaint  Patient presents with  . other    pt denies sob and swelling. pt states that he has experienced chest pain but not today    HPI: John Ball is a 73 y.o. male with a PMH below who presents today for urgent evaluation for chest pain. He has an extensive history of CAD status post CABG in 2004 along with PAD (R. Pop-Pop followed by femoropopliteal bypass recently in July 2015 at [facility]). He is also status post right lower lobectomy for squamous cell carcinoma of the lung. In addition to smoking, his cardiovascular risk factors include hypertension and hyperlipidemia as well as mother and father with stroke history.  He was a former patient of Dr. Quay Burow, who performed his preoperative catheterization before CABG and was seen by Dr. Debara Pickett in August of 2011 for preoperative evaluation for his second of 3 peripheral arterial surgeries. He had nuclear stress test that was negative for ischemia and relatively normal echocardiogram as well. He is essentially been lost to cardiology followup since.  He stopped taking aspirin a few months ago because of bruising. He has not been on a statin for his lipids, and his beta blocker because of his COPD. He is on ACE inhibitor only.  Interval History: he presents today for an urgent evaluation for an episode of chest pain he had yesterday. Basically, he and his wife just moved back to [place] a couple weeks ago for good. For the Thanksgiving weekend, he and his wife had driven out to [place] to visit family. They go back on Saturday and he had no major problems. He does have a little pain in the right leg when sitting for long period.  He actually was doing relatively well until yesterday (Sunday, November 29) when he developed sudden onsetchest discomfort described as a pressure and tightness in his  substernal area. It lasted about 10 minutes, subsiding after he took 2 full dose aspirin. Since then he progresses not just felt like himself. He has however been able to complete the task that he was doing yesterday and then was able to hang to tease him in the new house without having any exacerbation of his chest discomfort. It was associated with a little dyspnea but no sweating or sense of doom.  He has not had any further episodes, does note over the last several months he has intermittently been having some discomfort in his chest but nothing is mentioned before.  He denies any heart failure symptoms of PND, orthopnea or edema. He does have baseline resting intermittent wheezes with exertional dyspnea from COPD, but this is not changed. He does have occasional intermittent palpitations but nothing overly worrisome rapid irregular heart rates. No syncope/near syncope or TIAs with amaurosis fugax symptoms, however he did feel a bit malaise and fatigue when he had discomfort yesterday.  Cardiovascular ROS: positive for - chest pain, dyspnea on exertion and edema negative for - irregular heartbeat, loss of consciousness, murmur, orthopnea, palpitations, paroxysmal nocturnal dyspnea or rapid heart rate :  Past Medical History  Diagnosis Date  . COPD (chronic obstructive pulmonary disease)   . Left main coronary artery disease Jan 2004    a) Abn Myoview (RCA Ischemia) --> CATH: Ostial LM 60-70%, 50% mCx, 60-70% mid RI, 70-90-75% m-d RCA w/ 95% PLS --> CABG; b) Echo 8/'07: Normal LV thickness and function, mild  LV dilation. Essentially normal echo  . 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 Diaphragmatic attenuation  . Peripheral arterial occlusive disease 2011    a) Gore-Tex graft right AK popA-BK popA --> b) 2012: thrombosed graft --> thrombectomy with dacron patch = 1 V runnof via Peroneal; c) 01/2014: R  femoropopliteal bypass with left femoral vein  . Essential hypertension   . Hyperlipidemia with target LDL less than 70   . Smoker unmotivated to quit     Reportedly he came close to having a nervous breakdown when he last tried to quit  . Squamous cell lung cancer  2012-2014    a) 04/2011: R L Lobectomy & med node dissection ([id number]) - [facility] [place], [place]) w/o post-op Rx; b) CT-A Chest Jan 2013: R pl effusion, R hilar LAN (3.3 cm x 2.8 cm & 2.2 cm x 1.3 cm); c) 2/'13 PET-CT Chest: Bilat Hilar LAN, no distant Mets  d) CT chest 9/'14: Stable shotty hilar nodes bilaterally w/ no pathologic sized LAD or suspicious Pulm nodule;   . History of DVT of lower extremity   . Chronic pain     Feet & lower legs  . Depression   . Insomnia secondary to anxiety     takes Ambien    Prior Cardiac Evaluation and Past Surgical History: Past Surgical History  Procedure Laterality Date  . Abdominal aortogram  09/16/2008    bilateral lower extremity runoff arteriography  . Extremity arteriogram  09/16/2008    right lower  . Right femoral popliteral bypass  10/24/2008    6-mm Gore Tex Graft  . Thrombectomy      right femoral to above knee popliteral Gore-Tex graft  . Angioplasty      Dacron patch of Gore-Tex and saphenour vein junction and above knee popliteral artery  . Coronary artery bypass graft  07/2002    LIMA-LAD, freeRIMA-RI,SVG-OM2, SVG-rPDA  . Femoral-tibial bypass graft Right 01/10/2014    Procedure: Right Femoral to Posterior Tibial Bypass Graft using Left Leg Vein, Thrombectomy Right Common Femoral and Profunda of Leg . ;  Surgeon: Rosetta Posner, MD;  Location: [facility];  Service: Vascular;  Laterality: Right;  . Coronary artery bypass graft    . Lower lobectomy Right October 2012    With mediastinal node dissection  . Cardiac catheterization  January 2004    60-70% ostial left main, 50% midCx, 60-70% mid RI, 70, 90 and 75% mid-to distal RCA with 95% PLA    Allergies  Allergen Reactions  .  Aripiprazole     "makes him wild"  . Aspirin     Causes GI Bleed  . Crestor [Rosuvastatin Calcium] Itching  . Effexor [Venlafaxine Hydrochloride] Itching  . Lipitor [Atorvastatin Calcium] Itching  . Morphine And Related Itching  . Zocor [Simvastatin - High Dose] Itching    Current Outpatient Prescriptions  Medication Sig Dispense Refill  . albuterol (PROVENTIL) (5 MG/ML) 0.5% nebulizer solution Take 2.5 mg by nebulization every 6 (six) hours as needed for wheezing or shortness of breath.    . clonazePAM (KLONOPIN) 1 MG tablet Take 1 mg by mouth 2 (two) times daily as needed for anxiety.    . DULoxetine (CYMBALTA) 60 MG capsule Take 60 mg by mouth daily.     . Fluticasone-Salmeterol (ADVAIR DISKUS) 250-50 MCG/DOSE AEPB Inhale 1 puff into the lungs every 12 (twelve) hours.    Marland Kitchen  HYDROcodone-acetaminophen (NORCO/VICODIN) 5-325 MG per tablet Take 1-2 tablets by mouth every 4 (four) hours as needed for moderate pain. 30 tablet 0  . prazosin (MINIPRESS) 1 MG capsule Take 1 mg by mouth at bedtime.    Marland Kitchen QUEtiapine (SEROQUEL XR) 300 MG 24 hr tablet Take 300 mg by mouth at bedtime.     . ramipril (ALTACE) 10 MG capsule Take 10 mg by mouth daily.    Marland Kitchen tiotropium (SPIRIVA) 18 MCG inhalation capsule Place 18 mcg into inhaler and inhale daily.    . traZODone (DESYREL) 150 MG tablet Take 150 mg by mouth at bedtime.    Marland Kitchen zolpidem (AMBIEN CR) 12.5 MG CR tablet Take 12.5 mg by mouth at bedtime.      . isosorbide mononitrate (IMDUR) 30 MG 24 hr tablet Take 1 tablet (30 mg total) by mouth daily. 30 tablet 6  . nitroGLYCERIN (NITROSTAT) 0.4 MG SL tablet Place 1 tablet (0.4 mg total) under the tongue every 5 (five) minutes as needed for chest pain. 25 tablet 6   No current facility-administered medications for this visit.    History   Social History Narrative   He is married with 2 children. Does not exercise regularly.   He and his wife have been recently living in [place], [place] (but just moved back  to [place], [place] in November).   He still smokes about a half-pack a day and is not interested in quitting. No alcohol or recreational drug use.    family history includes Alzheimer's disease (age of onset: 18) in his father; Other (age of onset: 61) in his mother; Stroke in his father and mother.  ROS: A comprehensive Review of Systems - Was performed Review of Systems  Constitutional: Negative for fever, chills, weight loss and malaise/fatigue.  HENT: Negative for congestion and nosebleeds.   Eyes: Negative for blurred vision and double vision.  Respiratory: Positive for cough, shortness of breath and wheezing. Negative for hemoptysis and sputum production.        Chronic daily cough  Cardiovascular: Positive for claudication.       Non-symptom limiting RLE  Gastrointestinal: Positive for heartburn. Negative for nausea, vomiting, abdominal pain, constipation, blood in stool and melena.  Genitourinary: Negative for dysuria and hematuria.  Musculoskeletal: Positive for joint pain. Negative for myalgias and falls.       Both feet & legs  Neurological: Positive for tingling (neuropathy). Negative for dizziness, tremors, sensory change, speech change, focal weakness, seizures, loss of consciousness, weakness and headaches.  Endo/Heme/Allergies: Bruises/bleeds easily.  Psychiatric/Behavioral: Positive for depression. Negative for memory loss. The patient is nervous/anxious and has insomnia.   All other systems reviewed and are negative.   PHYSICAL EXAM BP 130/84 mmHg  Pulse 77  Ht 6' (1.829 m)  Wt 213 lb 1.6 oz (96.662 kg)  BMI 28.90 kg/m2 Physical Exam  Constitutional: He is oriented to person, place, and time. He appears well-developed and well-nourished. No distress.  Somewhat chronically ill-appearing  HENT:  Head: Normocephalic and atraumatic.  Nose: Nose normal.  Mouth/Throat: Oropharynx is clear and moist. No oropharyngeal exudate.  Eyes: Conjunctivae and EOM are normal.  Pupils are equal, round, and reactive to light. No scleral icterus.  Neck: Normal range of motion. No JVD present. No thyromegaly present.  Cardiovascular: Normal rate and regular rhythm.   Occasional extrasystoles are present. Exam reveals distant heart sounds and decreased pulses. Exam reveals no gallop, no friction rub and no midsystolic click.   No murmur heard.  Pulses:      Femoral pulses are on the right side with bruit.      Right popliteal pulse not accessible.       Dorsalis pedis pulses are 0 on the right side, and 1+ on the left side.       Posterior tibial pulses are 1+ on the right side, and 1+ on the left side.  Pulmonary/Chest: Effort normal. No respiratory distress. He has wheezes. He has no rales. He exhibits no tenderness.  Diffuse intradural extra wheezes/rhonchi; There is use of accessory muscles but no increased work of breathing.  Abdominal: Soft. Bowel sounds are normal. He exhibits no distension. There is no tenderness. There is no rebound and no guarding.  Genitourinary:  deferred  Musculoskeletal: Normal range of motion. He exhibits edema.  Neurological: He is alert and oriented to person, place, and time. No cranial nerve deficit.  Skin: Skin is warm and dry. No rash noted. No erythema. No pallor.  Diffuse ecchymosis in both hands and arms  Psychiatric: He has a normal mood and affect. His behavior is normal. Judgment and thought content normal.     Adult ECG Report   Rate: 77 ;  Rhythm: normal sinus rhythm  QRS Axis: 23 ;  PR Interval: 190 ;  QRS Duration: 86; QTc: 452;  Voltages: normal  Conduction Disturbances: none  Other Abnormalities: none   Narrative Interpretation: normal EKG  Recent Labs:    Chemistry      Component Value Date/Time   NA 139 05/02/2014 1156   NA 141 01/11/2014 0405   K 4.1 05/02/2014 1156   K 4.1 01/11/2014 0405   CL 104 01/11/2014 0405   CO2 24 05/02/2014 1156   CO2 24 01/11/2014 0405   BUN 15.7 05/02/2014 1156   BUN 12  01/11/2014 0405   CREATININE 1.1 05/02/2014 1156   CREATININE 0.91 01/11/2014 0405      Component Value Date/Time   CALCIUM 9.5 05/02/2014 1156   CALCIUM 8.6 01/11/2014 0405   ALKPHOS 61 05/02/2014 1156   ALKPHOS 66 01/10/2014 1309   AST 14 05/02/2014 1156   AST 17 01/10/2014 1309   ALT 14 05/02/2014 1156   ALT 15 01/10/2014 1309   BILITOT 0.40 05/02/2014 1156   BILITOT <0.2* 01/10/2014 1309     Lab Results  Component Value Date   WBC 5.2 05/02/2014   HGB 14.4 05/02/2014   HCT 43.9 05/02/2014   MCV 96.1 05/02/2014   PLT 142 05/02/2014     ASSESSMENT / PLAN: 72 year old gentleman with long-standing CAD and PVD history who continues to smoke, is not on aspirin or statin presents following a prolonged episode of chest pain/pressure yesterday.  Chest pain at rest He definitely had one concerning episode of chest tightness and pressure that does sound like resting angina. Despite this, it he was able to carry on with activity later on that day he did not have any angina. He does not note any additional chest pain episodes since that one prolonged episode, has had some minor episodes in the last few months. He has known CAD and PVD with CABG many years ago.  He has not had a cardiac assessment since 2011. Based on his risk factors and the nature of his symptoms is at least moderate risk for cardiac etiology.  Plan:  Lexiscan Myoview - unable to walk on treadmill  Sublingual nitroglycerin when necessary  Imdur 30 mg daily  Restart baby aspirin 81 mg  If there is  recurrent episode of prolonged chest pain requiring more than one nitroglycerin, he should call 911. Otherwise he can call the office in the morning.  Low threshold for proceeding to cardiac catheterization for recurrent symptoms.  Left main coronary artery disease Negative ischemic evaluation in 2011. Unfortunately this time he is having some symptoms. It has been at least 4 years since his last evaluation,  therefore wanted to go and proceed with a Myoview to assess this episode of chest discomfort. I am restarting aspirin. He is on an ACE inhibitor a beta blocker because of his COPD. He is also not on a Statin which will address in followup.  I don't unfortunately have any recent labs to tell me his lipid panel.  Essential hypertension Blood pressure is well controlled on ACE inhibitor. Without knowing his EF, would not want to start a different antianginal agent beyond Imdur.  S/P CABG x 4 See above with left main CAD. Would be due for followup Myoview anyway. More warranted with concerning chest pain  Hyperlipidemia with target LDL less than 70 Not on any therapeutic agents. Will need to have labs checked after next visit. He should probably be on a statin simply because of his combination of both PAD and CAD.  Smoker unmotivated to quit I was pretty much told that he simply cannot tolerate trying to quit smoking. He had a nervous breakdown according to the wife during his last attempt.  We talked about briefly, but he was not interested in pursuing the conversation..  Peripheral arterial occlusive disease He does have mild claudication symptoms, but are not lifestyle threatening. A sibling is to be on aggressive cardiovascular risk factor modification. Despite his bruising will need to start aspirin back. He should be on a statin or some type of anti-lipid medication.  At this point with limited symptoms, he would not need Pletal  COPD (chronic obstructive pulmonary disease) On multiple inhalers.      Orders Placed This Encounter  Procedures  . Myocardial Perfusion Imaging    Standing Status: Future     Number of Occurrences:      Standing Expiration Date: 06/06/2015    Order Specific Question:  Where should this test be performed    Answer:  [facility]    Order Specific Question:  Type of stress    Answer:  Lexiscan    Order Specific Question:  Patient weight in lbs     Answer:  213  . EKG 12-Lead   Meds ordered this encounter  Medications  . isosorbide mononitrate (IMDUR) 30 MG 24 hr tablet    Sig: Take 1 tablet (30 mg total) by mouth daily.    Dispense:  30 tablet    Refill:  6  . nitroGLYCERIN (NITROSTAT) 0.4 MG SL tablet    Sig: Place 1 tablet (0.4 mg total) under the tongue every 5 (five) minutes as needed for chest pain.    Dispense:  25 tablet    Refill:  6    Followup: 1 week -- with either Dr. Gwenlyn Found or Ellyn Hack or APP  Crystin Lechtenberg W. Ellyn Hack, M.D., M.S. Interventional Cardiolgy [facility]

## 2014-06-06 NOTE — Telephone Encounter (Signed)
Mrs,[name] is calling because John Ball had a episode on yesterday morning while he was at [facility] . Stating that he had Chest pains and pain in Left arm . Happen to be on the aisle with the baby aspirin took two and then sat down . This morning he is feeling extremely tired wants to know if he should come i to see somene.  Please call    Thanks

## 2014-06-06 NOTE — Assessment & Plan Note (Signed)
He definitely had one concerning episode of chest tightness and pressure that does sound like resting angina. Despite this, it he was able to carry on with activity later on that day he did not have any angina. He does not note any additional chest pain episodes since that one prolonged episode, has had some minor episodes in the last few months. He has known CAD and PVD with CABG many years ago.  He has not had a cardiac assessment since 2011. Based on his risk factors and the nature of his symptoms is at least moderate risk for cardiac etiology.  Plan:  Lexiscan Myoview - unable to walk on treadmill  Sublingual nitroglycerin when necessary  Imdur 30 mg daily  Restart baby aspirin 81 mg  If there is recurrent episode of prolonged chest pain requiring more than one nitroglycerin, he should call 911. Otherwise he can call the office in the morning.  Low threshold for proceeding to cardiac catheterization for recurrent symptoms.

## 2014-06-06 NOTE — Patient Instructions (Signed)
Start IMDUR (ISOSORBIDE MN) 30 MG ONE TABLET   AT BEDTIME.  May use NTG SUBLINGUAL -IF YOU NEED  TO USE ntg once go to the [facility] ER  This  [facility] physician has requested that you have a lexiscan myoview. For further information please visit [email or web address]. Please follow instruction sheet, as given.  Your physician recommends that you schedule a follow-up appointment  NEXT WEEK WITH Dr Vilma Meckel DR [facility] OR EXTENDER to follow up with test results

## 2014-06-06 NOTE — Telephone Encounter (Signed)
Pt. To see Dr Ellyn Hack at 2:15 today

## 2014-06-07 ENCOUNTER — Ambulatory Visit: Payer: Medicare Other | Admitting: Vascular Surgery

## 2014-06-07 ENCOUNTER — Other Ambulatory Visit (HOSPITAL_COMMUNITY): Payer: Medicare Other

## 2014-06-07 NOTE — Assessment & Plan Note (Signed)
He does have mild claudication symptoms, but are not lifestyle threatening. A sibling is to be on aggressive cardiovascular risk factor modification. Despite his bruising will need to start aspirin back. He should be on a statin or some type of anti-lipid medication.  At this point with limited symptoms, he would not need Pletal

## 2014-06-07 NOTE — Assessment & Plan Note (Signed)
Not on any therapeutic agents. Will need to have labs checked after next visit. He should probably be on a statin simply because of his combination of both PAD and CAD.

## 2014-06-07 NOTE — Assessment & Plan Note (Signed)
See above with left main CAD. Would be due for followup Myoview anyway. More warranted with concerning chest pain

## 2014-06-07 NOTE — Assessment & Plan Note (Signed)
Blood pressure is well controlled on ACE inhibitor. Without knowing his EF, would not want to start a different antianginal agent beyond Imdur.

## 2014-06-07 NOTE — Assessment & Plan Note (Signed)
Negative ischemic evaluation in 2011. Unfortunately this time he is having some symptoms. It has been at least 4 years since his last evaluation, therefore wanted to go and proceed with a Myoview to assess this episode of chest discomfort. I am restarting aspirin. He is on an ACE inhibitor a beta blocker because of his COPD. He is also not on a Statin which will address in followup.  I don't unfortunately have any recent labs to tell me his lipid panel.

## 2014-06-07 NOTE — Assessment & Plan Note (Signed)
On multiple inhalers.

## 2014-06-07 NOTE — Assessment & Plan Note (Signed)
I was pretty much told that he simply cannot tolerate trying to quit smoking. He had a nervous breakdown according to the wife during his last attempt.  We talked about briefly, but he was not interested in pursuing the conversation.Marland Kitchen

## 2014-06-15 ENCOUNTER — Telehealth (HOSPITAL_COMMUNITY): Payer: Self-pay

## 2014-06-15 ENCOUNTER — Other Ambulatory Visit: Payer: Self-pay | Admitting: *Deleted

## 2014-06-15 DIAGNOSIS — I739 Peripheral vascular disease, unspecified: Secondary | ICD-10-CM

## 2014-06-15 DIAGNOSIS — Z48812 Encounter for surgical aftercare following surgery on the circulatory system: Secondary | ICD-10-CM

## 2014-06-15 NOTE — Telephone Encounter (Signed)
Encounter complete. 

## 2014-06-17 ENCOUNTER — Ambulatory Visit (HOSPITAL_COMMUNITY)
Admission: RE | Admit: 2014-06-17 | Discharge: 2014-06-17 | Disposition: A | Discharge status: Home / Self Care | Payer: Medicare Other | Source: Ambulatory Visit | Attending: Cardiology | Admitting: Cardiology

## 2014-06-17 DIAGNOSIS — I2510 Atherosclerotic heart disease of native coronary artery without angina pectoris: Secondary | ICD-10-CM | POA: Diagnosis not present

## 2014-06-17 DIAGNOSIS — I10 Essential (primary) hypertension: Secondary | ICD-10-CM | POA: Diagnosis not present

## 2014-06-17 DIAGNOSIS — Z6828 Body mass index (BMI) 28.0-28.9, adult: Secondary | ICD-10-CM | POA: Diagnosis not present

## 2014-06-17 DIAGNOSIS — Z86718 Personal history of other venous thrombosis and embolism: Secondary | ICD-10-CM | POA: Insufficient documentation

## 2014-06-17 DIAGNOSIS — F17210 Nicotine dependence, cigarettes, uncomplicated: Secondary | ICD-10-CM | POA: Diagnosis not present

## 2014-06-17 DIAGNOSIS — I739 Peripheral vascular disease, unspecified: Secondary | ICD-10-CM | POA: Insufficient documentation

## 2014-06-17 DIAGNOSIS — Z459 Encounter for adjustment and management of unspecified implanted device: Secondary | ICD-10-CM | POA: Insufficient documentation

## 2014-06-17 DIAGNOSIS — Z951 Presence of aortocoronary bypass graft: Secondary | ICD-10-CM | POA: Diagnosis not present

## 2014-06-17 DIAGNOSIS — R002 Palpitations: Secondary | ICD-10-CM | POA: Insufficient documentation

## 2014-06-17 DIAGNOSIS — R0609 Other forms of dyspnea: Secondary | ICD-10-CM | POA: Insufficient documentation

## 2014-06-17 DIAGNOSIS — M79606 Pain in leg, unspecified: Secondary | ICD-10-CM | POA: Insufficient documentation

## 2014-06-17 DIAGNOSIS — C3490 Malignant neoplasm of unspecified part of unspecified bronchus or lung: Secondary | ICD-10-CM | POA: Diagnosis not present

## 2014-06-17 DIAGNOSIS — R079 Chest pain, unspecified: Secondary | ICD-10-CM | POA: Diagnosis not present

## 2014-06-17 DIAGNOSIS — Z85118 Personal history of other malignant neoplasm of bronchus and lung: Secondary | ICD-10-CM | POA: Insufficient documentation

## 2014-06-17 DIAGNOSIS — E663 Overweight: Secondary | ICD-10-CM | POA: Insufficient documentation

## 2014-06-17 DIAGNOSIS — J449 Chronic obstructive pulmonary disease, unspecified: Secondary | ICD-10-CM | POA: Diagnosis not present

## 2014-06-17 DIAGNOSIS — R5383 Other fatigue: Secondary | ICD-10-CM | POA: Insufficient documentation

## 2014-06-17 MED ORDER — AMINOPHYLLINE 25 MG/ML IV SOLN
125.0000 mg | Freq: Once | INTRAVENOUS | Status: AC
Start: 2014-06-17 — End: 2014-06-17
  Administered 2014-06-17: 125 mg via INTRAVENOUS

## 2014-06-17 MED ORDER — REGADENOSON 0.4 MG/5ML IV SOLN
0.4000 mg | Freq: Once | INTRAVENOUS | Status: AC
  Administered 2014-06-17: 0.4 mg via INTRAVENOUS

## 2014-06-17 MED ORDER — TECHNETIUM TC 99M SESTAMIBI GENERIC - CARDIOLITE
30.9000 | Freq: Once | INTRAVENOUS | Status: AC | PRN
  Administered 2014-06-17: 30.9 via INTRAVENOUS

## 2014-06-17 MED ORDER — TECHNETIUM TC 99M SESTAMIBI GENERIC - CARDIOLITE
10.3000 | Freq: Once | INTRAVENOUS | Status: AC | PRN
  Administered 2014-06-17: 10 via INTRAVENOUS

## 2014-06-17 NOTE — Procedures (Addendum)
[facility] [address] [place] [address] [phone]  Cardiology Nuclear Med Study  John Ball is a 73 y.o. male     MRN : [id number]     DOB: [birth year]  Procedure Date: 06/17/2014  Nuclear Med Background Indication for Stress Test:  Evaluation for Ischemia and Graft Patency History:  COPD and CAD;CABG X4-07/2002;Lung cancer;Last NUC MPI on 02/27/2010-nonischemic Cardiac Risk Factors: Claudication, Hypertension, Lipids, Overweight, PVD, Smoker and Lower extremity DVT;  Symptoms:  Chest Pain, DOE, Fatigue, Palpitations and Lower extremity pain.   Nuclear Pre-Procedure Caffeine/Decaff Intake:  1:00am NPO After: 11am   IV Site: R Forearm  IV 0.9% NS with Angio Cath:  22g  Chest Size (in):  48"  IV Started by: Rolene Course, RN  Height: 6' (1.829 m)  Cup Size: n/a  BMI:  Body mass index is 28.88 kg/(m^2). Weight:  213 lb (96.616 kg)   Tech Comments:  n/a    Nuclear Med Study 1 or 2 day study: 1 day  Stress Test Type:  [place]  Order Authorizing Provider:  Glenetta Hew, MD   Resting Radionuclide: Technetium 32m Sestamibi  Resting Radionuclide Dose: 10.3 mCi   Stress Radionuclide:  Technetium 64m Sestamibi  Stress Radionuclide Dose: 30.9 mCi           Stress Protocol Rest HR: 67 Stress HR: 81  Rest BP: 132/69 Stress BP: 137/68  Exercise Time (min): n/a METS: n/a          Dose of Adenosine (mg):  n/a Dose of Lexiscan: 0.4 mg  Dose of Atropine (mg): n/a Dose of Dobutamine: n/a mcg/kg/min (at max HR)  Stress Test Technologist: Mellody Memos, CCT Nuclear Technologist: Imagene Riches, CNMT   Rest Procedure:  Myocardial perfusion imaging was performed at rest 45 minutes following the intravenous administration of Technetium 18m Sestamibi. Stress Procedure:  The patient received IV Lexiscan 0.4 mg over 15-seconds.  Technetium 63m Sestamibi injected IV at 30-seconds.  Patient experienced shortness of breath  and was administered 125 mg of Aminophylline IV. There were no significant changes with Lexiscan.  Quantitative spect images were obtained after a 45 minute delay.  Transient Ischemic Dilatation (Normal <1.22):  1.16  QGS EDV:  105 ml QGS ESV:  46 ml LV Ejection Fraction: 56%   Rest ECG: NSR, RVCD.  Stress ECG: No significant ST segment change suggestive of ischemia.  QPS Raw Data Images:  Acquisition technically good; normal left ventricular size. Stress Images:  There is decreased uptake in the inferior septal wall. Rest Images:  There is decreased uptake in the inferior septal wall, less prominent compared to the stress images. Subtraction (SDS):  These findings are consistent with small prior inferior septal infarct and mild to moderate peri-infarct ischemia.  Impression Exercise Capacity:  Lexiscan with no exercise. BP Response:  Normal blood pressure response. Clinical Symptoms:  There is dyspnea. ECG Impression:  No significant ST segment change suggestive of ischemia. Comparison with Prior Nuclear Study: No images to compare  Overall Impression:  Intermediate risk stress nuclear study with a moderate size, moderate intensity, partially reversible inferior septal defect consistent with prior infarct and mild to moderate peri-infarct ischemia.  LV Wall Motion:  Mild hypokinesis of the inferior septal wall.   Kirk Ruths, MD  06/17/2014 4:13 PM

## 2014-06-20 ENCOUNTER — Telehealth: Payer: Self-pay | Admitting: *Deleted

## 2014-06-20 NOTE — Telephone Encounter (Signed)
Spoke to wife   RN offered an appointment to  discuss results of myoview with Dr Ellyn Hack.. Wife states patient has an appointment with Tarri Fuller PA. She states they would gather keep appointment at 79 :30 am instead of  4 pm due to traffic.

## 2014-06-22 ENCOUNTER — Encounter: Payer: Self-pay | Admitting: Physician Assistant

## 2014-06-22 ENCOUNTER — Ambulatory Visit (INDEPENDENT_AMBULATORY_CARE_PROVIDER_SITE_OTHER): Payer: Medicare Other | Admitting: Physician Assistant

## 2014-06-22 VITALS — BP 110/65 | HR 77 | Ht 72.0 in | Wt 213.2 lb

## 2014-06-22 DIAGNOSIS — R9439 Abnormal result of other cardiovascular function study: Secondary | ICD-10-CM | POA: Diagnosis not present

## 2014-06-22 DIAGNOSIS — D689 Coagulation defect, unspecified: Secondary | ICD-10-CM

## 2014-06-22 DIAGNOSIS — Z01818 Encounter for other preprocedural examination: Secondary | ICD-10-CM | POA: Diagnosis not present

## 2014-06-22 DIAGNOSIS — I779 Disorder of arteries and arterioles, unspecified: Secondary | ICD-10-CM | POA: Diagnosis not present

## 2014-06-22 DIAGNOSIS — F17210 Nicotine dependence, cigarettes, uncomplicated: Secondary | ICD-10-CM

## 2014-06-22 DIAGNOSIS — I10 Essential (primary) hypertension: Secondary | ICD-10-CM | POA: Diagnosis not present

## 2014-06-22 DIAGNOSIS — R5383 Other fatigue: Secondary | ICD-10-CM | POA: Diagnosis not present

## 2014-06-22 DIAGNOSIS — E785 Hyperlipidemia, unspecified: Secondary | ICD-10-CM

## 2014-06-22 DIAGNOSIS — R079 Chest pain, unspecified: Secondary | ICD-10-CM

## 2014-06-22 DIAGNOSIS — Z951 Presence of aortocoronary bypass graft: Secondary | ICD-10-CM | POA: Diagnosis not present

## 2014-06-22 DIAGNOSIS — F17200 Nicotine dependence, unspecified, uncomplicated: Secondary | ICD-10-CM

## 2014-06-22 DIAGNOSIS — I2510 Atherosclerotic heart disease of native coronary artery without angina pectoris: Secondary | ICD-10-CM

## 2014-06-22 NOTE — Assessment & Plan Note (Signed)
Continues to smoke.

## 2014-06-22 NOTE — Patient Instructions (Addendum)
Your physician has requested that you have a cardiac catheterization. We will be in contact with you to schedule this procedure. Cardiac catheterization is used to diagnose and/or treat various heart conditions. Doctors may recommend this procedure for a number of different reasons. The most common reason is to evaluate chest pain. Chest pain can be a symptom of coronary artery disease (CAD), and cardiac catheterization can show whether plaque is narrowing or blocking your heart's arteries. This procedure is also used to evaluate the valves, as well as measure the blood flow and oxygen levels in different parts of your heart. For further information please visit [email or web address].   Following your catheterization, you will not be allowed to drive for 3 days.  No lifting, pushing, or pulling greater that 10 pounds is allowed for 1 week.  You will be required to have the following tests prior to the procedure:  1. Blood work-the blood work can be done no more than 7 days prior to the procedure.  It can be done at any [facility] lab.  There is one downstairs on the first floor of this building and one in the [place] ([address])

## 2014-06-22 NOTE — Assessment & Plan Note (Addendum)
He has not had any CP since being started on Imdur.  06/17/14:  Intermediate risk stress nuclear study with a moderate size, moderate intensity, partially reversible inferior septal defect consistent with prior infarct and mild to moderate peri-infarct ischemia.  The patient will be scheduled for a left heart cath.  He is due for LEA dopplers with Dr. Donnetta Hutching on 12/22.  The patient's wife is very concerned about the cath through the groin(SP Right FEMORAL-TIBIAL BYPASS GRAFT).    Allen's test to the left hand is poor.  Little perfusion through the radial.  May need to do a left groin approach.

## 2014-06-22 NOTE — Assessment & Plan Note (Signed)
Pressure well controlled.

## 2014-06-22 NOTE — Assessment & Plan Note (Signed)
Statin allergy

## 2014-06-22 NOTE — Progress Notes (Signed)
Patient ID: John Ball, male   DOB: 08-Sep-1940, 73 y.o.   MRN: [id number]    Date:  06/22/2014   ID:  John Ball, DOB 08-04-1940, MRN [id number]  PCP:  Cloyd Stagers, MD  Primary Cardiologist:  harding    History of Present Illness: John Ball is a 73 y.o. male with a PMH below who presents today for follow-up after his stress test.  It was read as intermediate risk with a moderate size, moderate intensity, partially reversible inferior septal defect consistent with prior infarct and mild to moderate peri-infarct ischemia.    He has an extensive history of CAD status post CABG in 2004 along with PAD (R. Pop-Pop followed by femoropopliteal bypass recently in July 2015 at [facility]). He is also status post right lower lobectomy for squamous cell carcinoma of the lung. In addition to smoking, his cardiovascular risk factors include hypertension and hyperlipidemia as well as mother and father with stroke history.  He was a former patient of Dr. Quay Burow, who performed his preoperative catheterization before CABG and was seen by Dr. Debara Pickett in August of 2011 for preoperative evaluation for his second of 3 peripheral arterial surgeries. He had nuclear stress test that was negative for ischemia and relatively normal echocardiogram as well.  He stopped taking aspirin a few months ago because of bruising. He has not been on a statin for his lipids, and his beta blocker because of his COPD. He is on ACE inhibitor only.   Patient states that since being started on indoor he's had no further chest pain.  He currently denies nausea, vomiting, fever,  shortness of breath beyond baseline, orthopnea, dizziness, PND, cough, congestion, abdominal pain, hematochezia, melena, lower extremity edema, claudication.  His wife is very concerned about doing heart cath through the groin is of his femoropopliteal bypass surgery.  Wt Readings from Last 3 Encounters:  06/22/14 213 lb  3.2 oz (96.707 kg)  06/17/14 213 lb (96.616 kg)  06/06/14 213 lb 1.6 oz (96.662 kg)     Past Medical History  Diagnosis Date  . COPD (chronic obstructive pulmonary disease)   . 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 Essentially normal echo  . 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 Diaphragmatic attenuation  . Peripheral arterial occlusive disease 2011    a) Gore-Tex graft right AK popA-BK popA --> b) 2012: thrombosed graft --> thrombectomy with dacron patch = 1 V runnof via Peroneal; c) 01/2014: R femoropopliteal bypass with left femoral vein  . Essential hypertension   . Hyperlipidemia with target LDL less than 70   . Smoker unmotivated to quit     Reportedly he came close to having a nervous breakdown when he last tried to quit  . Squamous cell lung cancer  2012-2014    a) 04/2011: R L Lobectomy & med node dissection (T1aN0M0) - [facility] [place], [place]) w/o post-op Rx; b) CT-A Chest Jan 2013: R pl effusion, R hilar LAN (3.3 cm x 2.8 cm & 2.2 cm x 1.3 cm); c) 2/'13 PET-CT Chest: Bilat Hilar LAN, no distant Mets  d) CT chest 9/'14: Stable shotty hilar nodes bilaterally w/ no  pathologic sized LAD or suspicious Pulm nodule;   . History of DVT of lower extremity   . Chronic pain     Feet & lower legs  . Depression   . Insomnia secondary to anxiety     takes Ambien    Current Outpatient Prescriptions  Medication Sig Dispense Refill  . albuterol (PROVENTIL) (5 MG/ML) 0.5% nebulizer solution Take 2.5 mg by nebulization every 6 (six) hours as needed for wheezing or shortness of breath.    . clonazePAM (KLONOPIN) 1 MG tablet Take 1 mg by mouth 2 (two) times daily as needed for anxiety.    . DULoxetine (CYMBALTA) 60 MG  capsule Take 60 mg by mouth daily.     . Fluticasone-Salmeterol (ADVAIR DISKUS) 250-50 MCG/DOSE AEPB Inhale 1 puff into the lungs every 12 (twelve) hours.    . gabapentin (NEURONTIN) 100 MG capsule Take 100 mg by mouth at bedtime.    Marland Kitchen HYDROcodone-acetaminophen (NORCO/VICODIN) 5-325 MG per tablet Take 1-2 tablets by mouth every 4 (four) hours as needed for moderate pain. 30 tablet 0  . nitroGLYCERIN (NITROSTAT) 0.4 MG SL tablet Place 1 tablet (0.4 mg total) under the tongue every 5 (five) minutes as needed for chest pain. 25 tablet 6  . OXYGEN Inhale into the lungs at bedtime.    . prazosin (MINIPRESS) 1 MG capsule Take 1 mg by mouth at bedtime.    Marland Kitchen QUEtiapine (SEROQUEL XR) 300 MG 24 hr tablet Take 300 mg by mouth at bedtime.     Marland Kitchen tiotropium (SPIRIVA) 18 MCG inhalation capsule Place 18 mcg into inhaler and inhale daily.    . traZODone (DESYREL) 150 MG tablet Take 150 mg by mouth at bedtime.    Marland Kitchen zolpidem (AMBIEN CR) 12.5 MG CR tablet Take 12.5 mg by mouth at bedtime.       No current facility-administered medications for this visit.    Allergies:    Allergies  Allergen Reactions  . Aripiprazole     "makes him wild"  . Aspirin     Causes GI Bleed  . Crestor [Rosuvastatin Calcium] Itching  . Effexor [Venlafaxine Hydrochloride] Itching  . Lipitor [Atorvastatin Calcium] Itching  . Morphine And Related Itching  . Zocor [Simvastatin - High Dose] Itching    Social History:  The patient  reports that he has been smoking Cigarettes.  He has a 33 pack-year smoking history. He has never used smokeless tobacco. He reports that he does not drink alcohol or use illicit drugs.   Family history:   Family History  Problem Relation Age of Onset  . Other Mother 48    Abdominal Aortic Aneurysm   . Alzheimer's disease Father 31  . Stroke Mother   . Stroke Father     ROS:  Please see the history of present illness.  All other systems reviewed and negative.   PHYSICAL EXAM: VS:  BP 110/65 mmHg   Pulse 77  Ht 6' (1.829 m)  Wt 213 lb 3.2 oz (96.707 kg)  BMI 28.91 kg/m2 Well nourished, well developed, in no acute distress HEENT: Pupils are equal round react to light accommodation extraocular movements are intact.  Neck: no JVDNo cervical lymphadenopathy. Cardiac: Regular rate and rhythm without murmurs rubs or gallops. Lungs:  clear to auscultation bilaterally, no wheezing, rhonchi or rales Ext: Trace right lower extremity edema.  2+right, 1+ left radial and 2+dorsalis pedis, PT pulses. Skin: warm and dry Neuro:  Grossly normal    ASSESSMENT AND PLAN:  Problem List  Items Addressed This Visit    Smoker unmotivated to quit (Chronic)    Continues to smoke    S/P CABG x 4 (Chronic)   Left main coronary artery disease (Chronic)   Essential hypertension (Chronic)    Pressure well-controlled    Chest pain at rest    He has not had any CP since being started on Imdur.  06/17/14:  Intermediate risk stress nuclear study with a moderate size, moderate intensity, partially reversible inferior septal defect consistent with prior infarct and mild to moderate peri-infarct ischemia.  The patient will be scheduled for a left heart cath.  He is due for LEA dopplers with Dr. Donnetta Hutching on 12/22.  The patient's wife is very concerned about the cath through the groin(SP Right FEMORAL-TIBIAL BYPASS GRAFT).    Allen's test to the left hand is poor.  Little perfusion through the radial.  May need to do a left groin approach.     Other Visit Diagnoses    Abnormal stress test    -  Primary    Relevant Orders       LEFT HEART CATHETERIZATION WITH CORONARY ANGIOGRAM    Preoperative testing        Relevant Orders       Basic metabolic panel       CBC    Coagulation disorder        Relevant Orders       Protime-INR       APTT    Other fatigue        Relevant Orders       TSH

## 2014-06-27 ENCOUNTER — Encounter: Payer: Self-pay | Admitting: Vascular Surgery

## 2014-06-28 ENCOUNTER — Ambulatory Visit (INDEPENDENT_AMBULATORY_CARE_PROVIDER_SITE_OTHER): Payer: Medicare Other | Admitting: Vascular Surgery

## 2014-06-28 ENCOUNTER — Encounter: Payer: Self-pay | Admitting: Vascular Surgery

## 2014-06-28 ENCOUNTER — Ambulatory Visit (INDEPENDENT_AMBULATORY_CARE_PROVIDER_SITE_OTHER)
Admission: RE | Admit: 2014-06-28 | Discharge: 2014-06-28 | Disposition: A | Discharge status: Home / Self Care | Payer: Medicare Other | Source: Ambulatory Visit | Attending: Vascular Surgery | Admitting: Vascular Surgery

## 2014-06-28 ENCOUNTER — Ambulatory Visit (HOSPITAL_COMMUNITY)
Admission: RE | Admit: 2014-06-28 | Discharge: 2014-06-28 | Disposition: A | Discharge status: Home / Self Care | Payer: Medicare Other | Source: Ambulatory Visit | Attending: Vascular Surgery | Admitting: Vascular Surgery

## 2014-06-28 VITALS — BP 133/79 | HR 75 | Resp 18 | Ht 71.5 in | Wt 212.5 lb

## 2014-06-28 DIAGNOSIS — Z48812 Encounter for surgical aftercare following surgery on the circulatory system: Secondary | ICD-10-CM | POA: Diagnosis not present

## 2014-06-28 DIAGNOSIS — I739 Peripheral vascular disease, unspecified: Secondary | ICD-10-CM

## 2014-06-28 DIAGNOSIS — I779 Disorder of arteries and arterioles, unspecified: Secondary | ICD-10-CM

## 2014-06-28 NOTE — Progress Notes (Addendum)
History of Present Illness  John Ball is a 73 y.o. male who is here for a follow up visit s/p right common femoral and profundus femoris artery thrombectomy and endarterectomy and right femoral to posterior tibial bypass with saphenous vein harvested from left leg.  He reports no fever, chills no BN/V.  He has no complaints of claudication at this time.    Past Medical History  Diagnosis Date  . COPD (chronic obstructive pulmonary disease)   . 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 Essentially normal echo  . 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 Diaphragmatic attenuation  . Peripheral arterial occlusive disease 2011    a) Gore-Tex graft right AK popA-BK popA --> b) 2012: thrombosed graft --> thrombectomy with dacron patch = 1 V runnof via Peroneal; c) 01/2014: R femoropopliteal bypass with left femoral vein  . Essential hypertension   . Hyperlipidemia with target LDL less than 70   . Smoker unmotivated to quit     Reportedly he came close to having a nervous breakdown when he last tried to quit  . Squamous cell lung cancer  2012-2014    a) 04/2011: R L Lobectomy & med node dissection (T1aN0M0) - [facility] [place], [place]) w/o post-op Rx; b) CT-A Chest Jan 2013: R pl effusion, R hilar LAN (3.3 cm x 2.8 cm & 2.2 cm x 1.3 cm); c) 2/'13 PET-CT Chest: Bilat Hilar LAN, no distant Mets  d) CT chest 9/'14: Stable shotty hilar nodes bilaterally w/ no pathologic sized LAD or suspicious Pulm nodule;   . History of DVT of lower extremity   . Chronic pain     Feet & lower legs  . Depression   . Insomnia secondary to anxiety     takes Ambien    Past Surgical History  Procedure Laterality Date  . Abdominal aortogram   09/16/2008    bilateral lower extremity runoff arteriography  . Extremity arteriogram  09/16/2008    right lower  . Right femoral popliteral bypass  10/24/2008    6-mm Gore Tex Graft  . Thrombectomy      right femoral to above knee popliteral Gore-Tex graft  . Angioplasty      Dacron patch of Gore-Tex and saphenour vein junction and above knee popliteral artery  . Coronary artery bypass graft  07/2002    LIMA-LAD, freeRIMA-RI,SVG-OM2, SVG-rPDA  . Femoral-tibial bypass graft Right 01/10/2014    Procedure: Right Femoral to Posterior Tibial Bypass Graft using Left Leg Vein, Thrombectomy Right Common Femoral and Profunda of Leg . ;  Surgeon: Rosetta Posner, MD;  Location: [facility] OR;  Service: Vascular;  Laterality: Right;  . Coronary artery bypass graft    . Lower lobectomy Right October 2012    With mediastinal node dissection  . Cardiac catheterization  January 2004    60-70% ostial left main, 50% midCx, 60-70% mid RI, 70, 90 and 75% mid-to distal RCA with 95% PLA    History   Social History  . Marital Status: Married    Spouse Name: N/A    Number of Children: 2  . Years of Education: N/A   Occupational History  .  Not on file.   Social History Main Topics  . Smoking status: Current Every Day Smoker -- 0.60 packs/day for 55 years    Types: Cigarettes  . Smokeless tobacco: Never Used  . Alcohol Use: No  . Drug Use: No  . Sexual Activity: Not on file   Other Topics Concern  . Not on file   Social History Narrative   He is married with 2 children. Does not exercise regularly.   He and his wife have been recently living in [place], [place] (but just moved back to [place], [place] in November).   He still smokes about a half-pack a day and is not interested in quitting. No alcohol or recreational drug use.    Family History  Problem Relation Age of Onset  . Other Mother 79    Abdominal Aortic Aneurysm   . Alzheimer's disease Father 83  . Stroke Mother   . Stroke Father      Current Outpatient Prescriptions on File Prior to Visit  Medication Sig Dispense Refill  . albuterol (PROVENTIL) (5 MG/ML) 0.5% nebulizer solution Take 2.5 mg by nebulization every 6 (six) hours as needed for wheezing or shortness of breath.    . clonazePAM (KLONOPIN) 1 MG tablet Take 1 mg by mouth 2 (two) times daily as needed for anxiety.    . DULoxetine (CYMBALTA) 60 MG capsule Take 60 mg by mouth daily.     Marland Kitchen gabapentin (NEURONTIN) 100 MG capsule Take 100 mg by mouth at bedtime.    Marland Kitchen HYDROcodone-acetaminophen (NORCO/VICODIN) 5-325 MG per tablet Take 1-2 tablets by mouth every 4 (four) hours as needed for moderate pain. 30 tablet 0  . nitroGLYCERIN (NITROSTAT) 0.4 MG SL tablet Place 1 tablet (0.4 mg total) under the tongue every 5 (five) minutes as needed for chest pain. 25 tablet 6  . OXYGEN Inhale into the lungs at bedtime.    . prazosin (MINIPRESS) 1 MG capsule Take 1 mg by mouth at bedtime.    Marland Kitchen QUEtiapine (SEROQUEL XR) 300 MG 24 hr tablet Take 300 mg by mouth at bedtime.     Marland Kitchen tiotropium (SPIRIVA) 18 MCG inhalation capsule Place 18 mcg into inhaler and inhale daily.    . traZODone (DESYREL) 150 MG tablet Take 150 mg by mouth at bedtime.    Marland Kitchen zolpidem (AMBIEN CR) 12.5 MG CR tablet Take 12.5 mg by mouth at bedtime.      . Fluticasone-Salmeterol (ADVAIR DISKUS) 250-50 MCG/DOSE AEPB Inhale 1 puff into the lungs every 12 (twelve) hours.     No current facility-administered medications on file prior to visit.    Allergies  Allergen Reactions  . Aripiprazole     "makes him wild"  . Aspirin     Causes GI Bleed  . Crestor [Rosuvastatin Calcium] Itching  . Effexor [Venlafaxine Hydrochloride] Itching  . Lipitor [Atorvastatin Calcium] Itching  . Morphine And Related Itching  . Zocor [Simvastatin - High Dose] Itching    Review of Systems: As listed above, otherwise negative.  Physical Examination  Filed Vitals:   06/28/14 1619  BP: 133/79  Pulse: 75  Resp: 18  Height: 5'  11.5" (1.816 m)  Weight: 212 lb 8 oz (96.389 kg)    General: A&O x 3, WDWN S  Pulmonary: Sym exp, good air movt, CTAB, no rales, rhonchi, & wheezing  Cardiac: RRR, Nl S1, S2, no Murmurs, rubs or gallops  Gastrointestinal: soft, NTND  Musculoskeletal: M/S 5/5 throughout, Extremities without ischemic changes  ABI: Right 1.04 Left 0.94 Normal arterial perfusion   Medical Decision Making  John Ball is a 73 y.o. male S/P 01/10/2014 PROCEDURE: #1 right common femoral and profundus femoris artery thrombectomy and endarterectomy #2 right femoral to posterior tibial bypass with saphenous vein harvested from left leg. He is doing well over all.  He has healed his surgery wounds and has no rest pain.   He was seen today in conjunction with Dr. Donnetta Hutching.  He was asking Dr. Luther Parody recommendation for access to perform his Coronary artery stent in the near future.  He has 2+ palpable femoral pulses.  Dr. Donnetta Hutching recommends left femoral artery approach first, if neccessary you could go to the right femoral approach, that is the by-pass graft side.  He will f/u in 6 months for ABI's and Arterial duplex of bilateral LE extremities.  Theda Sers Kourtnee Lahey [facility] PA-C [facility] [place] Office: [phone]   06/28/2014, 4:49 PM  I have examined the patient, reviewed and agree with above. All overall. Does have a patent right femoral to tibial bypass. No prior history of left bypass. Does have left leg vein harvest. Should be okay for access the left groin if needed for cardiac cath. Has normal left femoral pulse  EARLY, TODD, MD 06/28/2014 5:39 PM

## 2014-06-29 ENCOUNTER — Other Ambulatory Visit: Payer: Self-pay

## 2014-06-29 DIAGNOSIS — I739 Peripheral vascular disease, unspecified: Secondary | ICD-10-CM

## 2014-06-29 DIAGNOSIS — Z48812 Encounter for surgical aftercare following surgery on the circulatory system: Secondary | ICD-10-CM

## 2014-07-05 ENCOUNTER — Encounter: Payer: Self-pay | Admitting: Cardiovascular Disease

## 2014-07-05 ENCOUNTER — Telehealth: Payer: Self-pay | Admitting: Cardiovascular Disease

## 2014-07-05 ENCOUNTER — Other Ambulatory Visit: Payer: Self-pay | Admitting: *Deleted

## 2014-07-05 DIAGNOSIS — R9439 Abnormal result of other cardiovascular function study: Secondary | ICD-10-CM

## 2014-07-05 NOTE — Progress Notes (Signed)
Entered Hosp Orders for [employer]

## 2014-07-05 NOTE — Telephone Encounter (Signed)
Spoke with Mrs.Mcbrearty regarding appointment for her husband's heart cath.  This is scheduled for Tuesday 07/12/14 at 9:00 am---arrival time is 7:00 am at short stay at [facility] after midnight---blood work to be done 07/07/14 and patient is to prepare for an overnight stay.  I told Mrs. Wherley I would discuss her husband's medications with Dr. Claiborne Billings on Thursday and he would receive a call if he needed to hold any medications prior to his procedure.  She voiced her understanding.

## 2014-07-07 DIAGNOSIS — D689 Coagulation defect, unspecified: Secondary | ICD-10-CM | POA: Diagnosis not present

## 2014-07-07 DIAGNOSIS — R5383 Other fatigue: Secondary | ICD-10-CM | POA: Diagnosis not present

## 2014-07-07 DIAGNOSIS — Z01818 Encounter for other preprocedural examination: Secondary | ICD-10-CM | POA: Diagnosis not present

## 2014-07-07 LAB — BASIC METABOLIC PANEL
BUN: 16 mg/dL (ref 6–23)
CO2: 24 meq/L (ref 19–32)
Calcium: 9.5 mg/dL (ref 8.4–10.5)
Chloride: 103 mEq/L (ref 96–112)
Creat: 1.21 mg/dL (ref 0.50–1.35)
GLUCOSE: 82 mg/dL (ref 70–99)
POTASSIUM: 4.3 meq/L (ref 3.5–5.3)
SODIUM: 139 meq/L (ref 135–145)

## 2014-07-07 LAB — PROTIME-INR
INR: 0.95 (ref <1.50)
Prothrombin Time: 12.7 seconds (ref 11.6–15.2)

## 2014-07-07 LAB — CBC
HEMATOCRIT: 46.5 % (ref 39.0–52.0)
Hemoglobin: 16 g/dL (ref 13.0–17.0)
MCH: 31.9 pg (ref 26.0–34.0)
MCHC: 34.4 g/dL (ref 30.0–36.0)
MCV: 92.6 fL (ref 78.0–100.0)
MPV: 11 fL (ref 9.4–12.4)
Platelets: 174 10*3/uL (ref 150–400)
RBC: 5.02 MIL/uL (ref 4.22–5.81)
RDW: 13.9 % (ref 11.5–15.5)
WBC: 6.3 10*3/uL (ref 4.0–10.5)

## 2014-07-07 LAB — TSH: TSH: 3.09 u[IU]/mL (ref 0.350–4.500)

## 2014-07-07 LAB — APTT: aPTT: 35 seconds (ref 24–37)

## 2014-07-12 ENCOUNTER — Ambulatory Visit (HOSPITAL_COMMUNITY)
Admission: RE | Admit: 2014-07-12 | Discharge: 2014-07-12 | Disposition: A | Discharge status: Home / Self Care | Payer: Medicare Other | Source: Ambulatory Visit | Attending: Cardiovascular Disease | Admitting: Cardiovascular Disease

## 2014-07-12 ENCOUNTER — Encounter (HOSPITAL_COMMUNITY): Payer: Self-pay | Admitting: Cardiovascular Disease

## 2014-07-12 ENCOUNTER — Encounter (HOSPITAL_COMMUNITY)
Admission: RE | Disposition: A | Discharge status: Home / Self Care | Payer: Self-pay | Source: Ambulatory Visit | Attending: Cardiovascular Disease

## 2014-07-12 DIAGNOSIS — E785 Hyperlipidemia, unspecified: Secondary | ICD-10-CM | POA: Insufficient documentation

## 2014-07-12 DIAGNOSIS — Z86718 Personal history of other venous thrombosis and embolism: Secondary | ICD-10-CM | POA: Diagnosis not present

## 2014-07-12 DIAGNOSIS — F17210 Nicotine dependence, cigarettes, uncomplicated: Secondary | ICD-10-CM | POA: Insufficient documentation

## 2014-07-12 DIAGNOSIS — Z951 Presence of aortocoronary bypass graft: Secondary | ICD-10-CM | POA: Insufficient documentation

## 2014-07-12 DIAGNOSIS — I2510 Atherosclerotic heart disease of native coronary artery without angina pectoris: Secondary | ICD-10-CM | POA: Diagnosis not present

## 2014-07-12 DIAGNOSIS — I10 Essential (primary) hypertension: Secondary | ICD-10-CM | POA: Diagnosis not present

## 2014-07-12 DIAGNOSIS — F329 Major depressive disorder, single episode, unspecified: Secondary | ICD-10-CM | POA: Insufficient documentation

## 2014-07-12 DIAGNOSIS — I739 Peripheral vascular disease, unspecified: Secondary | ICD-10-CM | POA: Insufficient documentation

## 2014-07-12 DIAGNOSIS — J449 Chronic obstructive pulmonary disease, unspecified: Secondary | ICD-10-CM | POA: Diagnosis not present

## 2014-07-12 DIAGNOSIS — R079 Chest pain, unspecified: Secondary | ICD-10-CM | POA: Diagnosis present

## 2014-07-12 DIAGNOSIS — R9439 Abnormal result of other cardiovascular function study: Secondary | ICD-10-CM | POA: Insufficient documentation

## 2014-07-12 DIAGNOSIS — I25709 Atherosclerosis of coronary artery bypass graft(s), unspecified, with unspecified angina pectoris: Secondary | ICD-10-CM | POA: Insufficient documentation

## 2014-07-12 HISTORY — PX: LEFT HEART CATHETERIZATION WITH CORONARY/GRAFT ANGIOGRAM: SHX5450

## 2014-07-12 SURGERY — LEFT HEART CATHETERIZATION WITH CORONARY/GRAFT ANGIOGRAM
Anesthesia: LOCAL

## 2014-07-12 MED ORDER — ACETAMINOPHEN 325 MG PO TABS: 650.0000 mg | ORAL_TABLET | ORAL | Status: DC | PRN

## 2014-07-12 MED ORDER — SODIUM CHLORIDE 0.9 % IV SOLN: INTRAVENOUS | Status: DC

## 2014-07-12 MED ORDER — FENTANYL CITRATE 0.05 MG/ML IJ SOLN
INTRAMUSCULAR | Status: AC
  Filled 2014-07-12: qty 2

## 2014-07-12 MED ORDER — ASPIRIN 81 MG PO CHEW: 81.0000 mg | CHEWABLE_TABLET | ORAL | Status: DC

## 2014-07-12 MED ORDER — SODIUM CHLORIDE 0.9 % IV SOLN
INTRAVENOUS | Status: DC
  Administered 2014-07-12: 08:00:00 via INTRAVENOUS

## 2014-07-12 MED ORDER — LIDOCAINE HCL (PF) 1 % IJ SOLN
INTRAMUSCULAR | Status: AC
  Filled 2014-07-12: qty 30

## 2014-07-12 MED ORDER — MIDAZOLAM HCL 2 MG/2ML IJ SOLN
INTRAMUSCULAR | Status: AC
  Filled 2014-07-12: qty 2

## 2014-07-12 MED ORDER — HEPARIN (PORCINE) IN NACL 2-0.9 UNIT/ML-% IJ SOLN
INTRAMUSCULAR | Status: AC
  Filled 2014-07-12: qty 1500

## 2014-07-12 MED ORDER — SODIUM CHLORIDE 0.9 % IJ SOLN: 3.0000 mL | INTRAMUSCULAR | Status: DC | PRN

## 2014-07-12 MED ORDER — NITROGLYCERIN 1 MG/10 ML FOR IR/CATH LAB
INTRA_ARTERIAL | Status: AC
  Filled 2014-07-12: qty 10

## 2014-07-12 MED ORDER — ONDANSETRON HCL 4 MG/2ML IJ SOLN: 4.0000 mg | Freq: Four times a day (QID) | INTRAMUSCULAR | Status: DC | PRN

## 2014-07-12 NOTE — Discharge Instructions (Signed)
Angiogram, Care After °Refer to this sheet in the next few weeks. These instructions provide you with information on caring for yourself after your procedure. Your health care provider may also give you more specific instructions. Your treatment has been planned according to current medical practices, but problems sometimes occur. Call your health care provider if you have any problems or questions after your procedure.  °WHAT TO EXPECT AFTER THE PROCEDURE °After your procedure, it is typical to have the following sensations: °· Minor discomfort or tenderness and a small bump at the catheter insertion site. The bump should usually decrease in size and tenderness within 1 to 2 weeks. °· Any bruising will usually fade within 2 to 4 weeks. °HOME CARE INSTRUCTIONS  °· You may need to keep taking blood thinners if they were prescribed for you. Take medicines only as directed by your health care provider. °· Do not apply powder or lotion to the site. °· Do not take baths, swim, or use a hot tub until your health care provider approves. °· You may shower 24 hours after the procedure. Remove the bandage (dressing) and gently wash the site with plain soap and water. Gently pat the site dry. °· Inspect the site at least twice daily. °· Limit your activity for the first 48 hours. Do not bend, squat, or lift anything over 20 lb (9 kg) or as directed by your health care provider. °· Plan to have someone take you home after the procedure. Follow instructions about when you can drive or return to work. °SEEK MEDICAL CARE IF: °· You get light-headed when standing up. °· You have drainage (other than a small amount of blood on the dressing). °· You have chills. °· You have a fever. °· You have redness, warmth, swelling, or pain at the insertion site. °SEEK IMMEDIATE MEDICAL CARE IF:  °· You develop chest pain or shortness of breath, feel faint, or pass out. °· You have bleeding, swelling larger than a walnut, or drainage from the  catheter insertion site. °· You develop pain, discoloration, coldness, or severe bruising in the leg or arm that held the catheter. °· You have heavy bleeding from the site. If this happens, hold pressure on the site and call 911. °MAKE SURE YOU: °· Understand these instructions. °· Will watch your condition. °· Will get help right away if you are not doing well or get worse. °Document Released: 01/10/2005 Document Revised: 11/08/2013 Document Reviewed: 11/16/2012 °Exit[employer]® Patient Information ©2015 [employer]. This information is not intended to replace advice given to you by your health care provider. Make sure you discuss any questions you have with your health care provider. ° °

## 2014-07-12 NOTE — Progress Notes (Signed)
Site area: left groin Site Prior to Removal:  Level 0 Pressure Applied For: 20 minutes Manual:   yes Patient Status During Pull:  stable Post Pull Site:  Level 0 Post Pull Instructions Given:  yes Post Pull Pulses Present: yes Dressing Applied:  tegaderm Bedrest begins @ 1250 Comments: no complications

## 2014-07-12 NOTE — Interval H&P Note (Signed)
Cath Lab Visit (complete for each Cath Lab visit)  Clinical Evaluation Leading to the Procedure:   ACS: No.  Non-ACS:    Anginal Classification: CCS III  Anti-ischemic medical therapy: Minimal Therapy (1 class of medications)  Non-Invasive Test Results: Intermediate-risk stress test findings: cardiac mortality 1-3%/year  Prior CABG: Previous CABG      History and Physical Interval Note:  07/12/2014 10:58 AM  John Ball  has presented today for surgery, with the diagnosis of cp  The various methods of treatment have been discussed with the patient and family. After consideration of risks, benefits and other options for treatment, the patient has consented to  Procedure(s): LEFT HEART CATHETERIZATION WITH CORONARY/GRAFT ANGIOGRAM (N/A) as a surgical intervention .  The patient's history has been reviewed, patient examined, no change in status, stable for surgery.  I have reviewed the patient's chart and labs.  Questions were answered to the patient's satisfaction.     Estefany Goebel A

## 2014-07-12 NOTE — H&P (View-Only) (Signed)
Patient ID: John Ball, male   DOB: Dec 24, 1940, 74 y.o.   MRN: [id number]    Date:  06/22/2014   ID:  John Ball, DOB 10-14-1940, MRN [id number]  PCP:  Cloyd Stagers, MD  Primary Cardiologist:  harding    History of Present Illness: John Ball is a 74 y.o. male with a PMH below who presents today for follow-up after his stress test.  It was read as intermediate risk with a moderate size, moderate intensity, partially reversible inferior septal defect consistent with prior infarct and mild to moderate peri-infarct ischemia.    He has an extensive history of CAD status post CABG in 2004 along with PAD (R. Pop-Pop followed by femoropopliteal bypass recently in July 2015 at [facility]). He is also status post right lower lobectomy for squamous cell carcinoma of the lung. In addition to smoking, his cardiovascular risk factors include hypertension and hyperlipidemia as well as mother and father with stroke history.  He was a former patient of Dr. Quay Burow, who performed his preoperative catheterization before CABG and was seen by Dr. Debara Pickett in August of 2011 for preoperative evaluation for his second of 3 peripheral arterial surgeries. He had nuclear stress test that was negative for ischemia and relatively normal echocardiogram as well.  He stopped taking aspirin a few months ago because of bruising. He has not been on a statin for his lipids, and his beta blocker because of his COPD. He is on ACE inhibitor only.   Patient states that since being started on indoor he's had no further chest pain.  He currently denies nausea, vomiting, fever,  shortness of breath beyond baseline, orthopnea, dizziness, PND, cough, congestion, abdominal pain, hematochezia, melena, lower extremity edema, claudication.  His wife is very concerned about doing heart cath through the groin is of his femoropopliteal bypass surgery.  Wt Readings from Last 3 Encounters:  06/22/14 213 lb  3.2 oz (96.707 kg)  06/17/14 213 lb (96.616 kg)  06/06/14 213 lb 1.6 oz (96.662 kg)     Past Medical History  Diagnosis Date  . COPD (chronic obstructive pulmonary disease)   . 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 Essentially normal echo  . 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 Diaphragmatic attenuation  . Peripheral arterial occlusive disease 2011    a) Gore-Tex graft right AK popA-BK popA --> b) 2012: thrombosed graft --> thrombectomy with dacron patch = 1 V runnof via Peroneal; c) 01/2014: R femoropopliteal bypass with left femoral vein  . Essential hypertension   . Hyperlipidemia with target LDL less than 70   . Smoker unmotivated to quit     Reportedly he came close to having a nervous breakdown when he last tried to quit  . Squamous cell lung cancer  2012-2014    a) 04/2011: R L Lobectomy & med node dissection ([id number]) - [facility] [place], [place]) w/o post-op Rx; b) CT-A Chest Jan 2013: R pl effusion, R hilar LAN (3.3 cm x 2.8 cm & 2.2 cm x 1.3 cm); c) 2/'13 PET-CT Chest: Bilat Hilar LAN, no distant Mets  d) CT chest 9/'14: Stable shotty hilar nodes bilaterally w/ no  pathologic sized LAD or suspicious Pulm nodule;   . History of DVT of lower extremity   . Chronic pain     Feet & lower legs  . Depression   . Insomnia secondary to anxiety     takes Ambien    Current Outpatient Prescriptions  Medication Sig Dispense Refill  . albuterol (PROVENTIL) (5 MG/ML) 0.5% nebulizer solution Take 2.5 mg by nebulization every 6 (six) hours as needed for wheezing or shortness of breath.    . clonazePAM (KLONOPIN) 1 MG tablet Take 1 mg by mouth 2 (two) times daily as needed for anxiety.    . DULoxetine (CYMBALTA) 60 MG  capsule Take 60 mg by mouth daily.     . Fluticasone-Salmeterol (ADVAIR DISKUS) 250-50 MCG/DOSE AEPB Inhale 1 puff into the lungs every 12 (twelve) hours.    . gabapentin (NEURONTIN) 100 MG capsule Take 100 mg by mouth at bedtime.    Marland Kitchen HYDROcodone-acetaminophen (NORCO/VICODIN) 5-325 MG per tablet Take 1-2 tablets by mouth every 4 (four) hours as needed for moderate pain. 30 tablet 0  . nitroGLYCERIN (NITROSTAT) 0.4 MG SL tablet Place 1 tablet (0.4 mg total) under the tongue every 5 (five) minutes as needed for chest pain. 25 tablet 6  . OXYGEN Inhale into the lungs at bedtime.    . prazosin (MINIPRESS) 1 MG capsule Take 1 mg by mouth at bedtime.    Marland Kitchen QUEtiapine (SEROQUEL XR) 300 MG 24 hr tablet Take 300 mg by mouth at bedtime.     Marland Kitchen tiotropium (SPIRIVA) 18 MCG inhalation capsule Place 18 mcg into inhaler and inhale daily.    . traZODone (DESYREL) 150 MG tablet Take 150 mg by mouth at bedtime.    Marland Kitchen zolpidem (AMBIEN CR) 12.5 MG CR tablet Take 12.5 mg by mouth at bedtime.       No current facility-administered medications for this visit.    Allergies:    Allergies  Allergen Reactions  . Aripiprazole     "makes him wild"  . Aspirin     Causes GI Bleed  . Crestor [Rosuvastatin Calcium] Itching  . Effexor [Venlafaxine Hydrochloride] Itching  . Lipitor [Atorvastatin Calcium] Itching  . Morphine And Related Itching  . Zocor [Simvastatin - High Dose] Itching    Social History:  The patient  reports that he has been smoking Cigarettes.  He has a 33 pack-year smoking history. He has never used smokeless tobacco. He reports that he does not drink alcohol or use illicit drugs.   Family history:   Family History  Problem Relation Age of Onset  . Other Mother 80    Abdominal Aortic Aneurysm   . Alzheimer's disease Father 2  . Stroke Mother   . Stroke Father     ROS:  Please see the history of present illness.  All other systems reviewed and negative.   PHYSICAL EXAM: VS:  BP 110/65 mmHg   Pulse 77  Ht 6' (1.829 m)  Wt 213 lb 3.2 oz (96.707 kg)  BMI 28.91 kg/m2 Well nourished, well developed, in no acute distress HEENT: Pupils are equal round react to light accommodation extraocular movements are intact.  Neck: no JVDNo cervical lymphadenopathy. Cardiac: Regular rate and rhythm without murmurs rubs or gallops. Lungs:  clear to auscultation bilaterally, no wheezing, rhonchi or rales Ext: Trace right lower extremity edema.  2+right, 1+ left radial and 2+dorsalis pedis, PT pulses. Skin: warm and dry Neuro:  Grossly normal    ASSESSMENT AND PLAN:  Problem List  Items Addressed This Visit    Smoker unmotivated to quit (Chronic)    Continues to smoke    S/P CABG x 4 (Chronic)   Left main coronary artery disease (Chronic)   Essential hypertension (Chronic)    Pressure well-controlled    Chest pain at rest    He has not had any CP since being started on Imdur.  06/17/14:  Intermediate risk stress nuclear study with a moderate size, moderate intensity, partially reversible inferior septal defect consistent with prior infarct and mild to moderate peri-infarct ischemia.  The patient will be scheduled for a left heart cath.  He is due for LEA dopplers with Dr. Donnetta Hutching on 12/22.  The patient's wife is very concerned about the cath through the groin(SP Right FEMORAL-TIBIAL BYPASS GRAFT).    Allen's test to the left hand is poor.  Little perfusion through the radial.  May need to do a left groin approach.     Other Visit Diagnoses    Abnormal stress test    -  Primary    Relevant Orders       LEFT HEART CATHETERIZATION WITH CORONARY ANGIOGRAM    Preoperative testing        Relevant Orders       Basic metabolic panel       CBC    Coagulation disorder        Relevant Orders       Protime-INR       APTT    Other fatigue        Relevant Orders       TSH

## 2014-07-12 NOTE — CV Procedure (Signed)
John Ball is a 74 y.o. male    [id number]  [id number] LOCATION:  FACILITY: [facility]  PHYSICIAN: Troy Sine, MD, [facility] 1941-06-29   DATE OF PROCEDURE:  07/12/2014    CARDIAC CATHETERIZATION     HISTORY:    John Ball is a 74 y.o. male saw by Dr. Ellyn Hack.  The patient is status post prior CABG revascularization surgery in 2004 with a LIMA graft to the LAD, a free RIMA graft to the ramus and related vessel, saphenous vein graft to the OM 2 vessel, and saphenous vein graft to the RCA.  A recent nuclear study was intermediate risk with and demonstrated a moderate size, moderate intensity, partially reversible inferior septal defect consistent with prior infarct and there was evidence for mild to moderate peri-infarction ischemia.  He now presents for cardiac catheterization.  The patient has peripheral vascular disease and is status post a right femoropopliteal surgery.  He has poor left radial pulse.  Catheterization is to be performed via the left femoral artery approach.   PROCEDURE: Left heart catheterization: coronary angiography of native coronary anatomy and selective angiography into the saphenous vein grafts, free RIMA graft, and left internal mammary artery, left ventriculography   The patient was brought to the [facility] cardiac catherization laboratory in the fasting state. He  was premedicated with Versed 2 mg and fentanyl 25 g. His Her right groin was prepped and shaved in usual sterile fashion. Xylocaine 1% was used for local anesthesia. A 5 French sheath was inserted into the R femoral artery.  Due to resistance in the left common iliac proximal to the aortic bifurcation.  A Glidewire was necessary to navigate this region.  Throughout the remainder of the catheterization a long exchange wire was used for catheter exchanges.  Diagnostic catheterizatiion was done with 5 [place] FL5, FR4, left bypass graft, LIMA  and pigtail catheters. Left ventriculography was done with  28 cc Omnipaque contrast. Hemostasis was obtained by direct manual compression. The patient tolerated the procedure well.   HEMODYNAMICS:   Central Aorta: 150/72   Left Ventricle: 150/23  ANGIOGRAPHY:  Left main:  Proximal stenosis of 70%.  The vessel trifurcated into an LAD, a ramus intermediate vessel, and the left circumflex coronary artery  LAD: Moderate size vessel that had proximal coronary calcification and smooth proximal narrowing of 40 to less than 50%.  There were significant septal collaterals supplying the distal portion of the RCA.  A "flush and fill" phenomena was seen in the mid LAD.  Due to competitive filling from the LIMA graft.  Ramus Intermediate: Moderate size vessel with competitive filling from the free RIMA graft in its midsegment.  Left circumflex: The proximal circumflex was normal and gave rise to a moderate-sized first marginal branch which was free of significant disease.  The second marginal branch was not visualized distally.  Right coronary artery: Total proximal occlusion after a large RV marginal branch with faint right to right and LAD septals to distal PDA-RCA collateralization  LIMA to LAD: Widely patent and anastomosed into the mid LAD.  The distal LAD was not well-visualized but appeared small caliber.  There appear to be as 30% eccentric proximal subclavian stenosis immediately after it arose from the aorta.  SVG to OM 2 vessel was widely patent.  Free  RIMA graft that supplied the ramus intermediate vessel was widely patent.  SVG which had supplied the RCA was totally occluded at its ostium   Left ventriculography revealed LV dysfunction with  an ejection fraction of 45-50%.  There was a suggestion of mid to basal inferior hypokinesis.    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  Significant native coronary obstructive disease with 70% proximal left main stenosis; 50%  proximal LAD stenosis, occluded OM 2 branch arising from the distal circumflex and proximal RCA occlusion after anterior RV marginal branch with left to right predominant septal collateralization to the distal RCA territory.  Patent left internal artery graft supplying the mid LAD.  Patent saphenous vein graft supplying the OM 2 vessel.  Patent free RIMA graft supplying the ramus intermediate vessel.  Occluded saphenous vein graft which had previously supplied the RCA.  RECOMMENDATION:  Medical therapy.  The patient's recent nuclear study which revealed a moderate size, moderate intensity, partially reversible inferior septal defect with mild to moderate peri-infarction ischemia is concordant with the occluded native RCA and occluded vein graft.  The patient does have distal collateralization from the septal perforating arteries arising from the LAD.  Troy Sine, MD, [facility] 07/12/2014 12:09 PM

## 2014-10-31 DIAGNOSIS — F5101 Primary insomnia: Secondary | ICD-10-CM | POA: Diagnosis not present

## 2014-10-31 DIAGNOSIS — J449 Chronic obstructive pulmonary disease, unspecified: Secondary | ICD-10-CM | POA: Diagnosis not present

## 2014-10-31 DIAGNOSIS — I739 Peripheral vascular disease, unspecified: Secondary | ICD-10-CM | POA: Diagnosis not present

## 2014-10-31 DIAGNOSIS — Z0000 Encounter for general adult medical examination without abnormal findings: Secondary | ICD-10-CM | POA: Diagnosis not present

## 2014-10-31 DIAGNOSIS — Z1389 Encounter for screening for other disorder: Secondary | ICD-10-CM | POA: Diagnosis not present

## 2014-10-31 DIAGNOSIS — I2510 Atherosclerotic heart disease of native coronary artery without angina pectoris: Secondary | ICD-10-CM | POA: Diagnosis not present

## 2014-10-31 DIAGNOSIS — F339 Major depressive disorder, recurrent, unspecified: Secondary | ICD-10-CM | POA: Diagnosis not present

## 2014-10-31 DIAGNOSIS — F17210 Nicotine dependence, cigarettes, uncomplicated: Secondary | ICD-10-CM | POA: Diagnosis not present

## 2014-10-31 DIAGNOSIS — I10 Essential (primary) hypertension: Secondary | ICD-10-CM | POA: Diagnosis not present

## 2014-12-21 DIAGNOSIS — D494 Neoplasm of unspecified behavior of bladder: Secondary | ICD-10-CM | POA: Diagnosis not present

## 2014-12-27 ENCOUNTER — Other Ambulatory Visit: Payer: Self-pay | Admitting: Urology

## 2014-12-30 ENCOUNTER — Encounter: Payer: Self-pay | Admitting: Family

## 2015-01-03 ENCOUNTER — Ambulatory Visit (HOSPITAL_COMMUNITY)
Admission: RE | Admit: 2015-01-03 | Discharge: 2015-01-03 | Disposition: A | Discharge status: Home / Self Care | Payer: Medicare Other | Source: Ambulatory Visit | Attending: Vascular Surgery | Admitting: Vascular Surgery

## 2015-01-03 ENCOUNTER — Encounter: Payer: Self-pay | Admitting: Family

## 2015-01-03 ENCOUNTER — Ambulatory Visit (INDEPENDENT_AMBULATORY_CARE_PROVIDER_SITE_OTHER): Payer: Medicare Other | Admitting: Family

## 2015-01-03 ENCOUNTER — Other Ambulatory Visit: Payer: Self-pay | Admitting: Vascular Surgery

## 2015-01-03 VITALS — BP 129/80 | HR 65 | Resp 16 | Ht 72.0 in | Wt 217.0 lb

## 2015-01-03 DIAGNOSIS — I739 Peripheral vascular disease, unspecified: Secondary | ICD-10-CM

## 2015-01-03 DIAGNOSIS — Z9889 Other specified postprocedural states: Secondary | ICD-10-CM | POA: Diagnosis not present

## 2015-01-03 DIAGNOSIS — Z720 Tobacco use: Secondary | ICD-10-CM | POA: Diagnosis not present

## 2015-01-03 DIAGNOSIS — Z4889 Encounter for other specified surgical aftercare: Secondary | ICD-10-CM

## 2015-01-03 DIAGNOSIS — Z48812 Encounter for surgical aftercare following surgery on the circulatory system: Secondary | ICD-10-CM

## 2015-01-03 DIAGNOSIS — I779 Disorder of arteries and arterioles, unspecified: Secondary | ICD-10-CM | POA: Diagnosis not present

## 2015-01-03 DIAGNOSIS — Z95820 Peripheral vascular angioplasty status with implants and grafts: Secondary | ICD-10-CM | POA: Diagnosis not present

## 2015-01-03 DIAGNOSIS — I70203 Unspecified atherosclerosis of native arteries of extremities, bilateral legs: Secondary | ICD-10-CM | POA: Diagnosis not present

## 2015-01-03 DIAGNOSIS — I10 Essential (primary) hypertension: Secondary | ICD-10-CM | POA: Insufficient documentation

## 2015-01-03 DIAGNOSIS — F17200 Nicotine dependence, unspecified, uncomplicated: Secondary | ICD-10-CM

## 2015-01-03 DIAGNOSIS — Z95828 Presence of other vascular implants and grafts: Secondary | ICD-10-CM

## 2015-01-03 NOTE — Progress Notes (Signed)
[facility] [place] HISTORY AND PHYSICAL -PAD  History of Present Illness John Ball is a 74 y.o. male patient of Dr. Donnetta Hutching who is status post extensive prior femoropopliteal bypasses on his right leg. He had initially had a right femoral to above-knee popliteal bypass in April 2010. This was followed by right above-knee to below-knee popliteal bypass in August of 2011. He presented with occlusion of this and underwent thrombectomy and revision of his femoral-popliteal bypass in April of 2012.  He is also s/p right common femoral and profundus femoris artery thrombectomy and endarterectomy; right femoral to posterior tibial bypass with saphenous vein harvested from left leg on 01/10/14. He has a history of DVT. He returns today for routine follow up. He walks about 30 minutes daily and no longer has claudication symptoms with walking, denies non healing wounds in feet or lower legs. His walking is limited by his dyspnea.  He denies any history of stroke or TIA, denies history of MI, but did have a CABG.  He has a history of lung cancer with right lobectomy.  The patient reports New Medical or Surgical History: he will have a lesion removed from his bladder in 2 weeks.  Pt Diabetic: No Pt smoker: smoker (less than 1 ppd, started at age 49 yrs)  Pt meds include: Statin :No, statins caused ithching ASA: No, record indicates that ASA caused GI bleed, but wife states this is not the case, that he bruised easily taking 81 mg ASA daily Other anticoagulants/antiplatelets: no    Past Medical History  Diagnosis Date  . COPD (chronic obstructive pulmonary disease)   . 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 Essentially normal echo  . 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 Diaphragmatic attenuation  . Peripheral arterial occlusive disease 2011    a) Gore-Tex graft right AK popA-BK popA --> b) 2012: thrombosed graft --> thrombectomy with dacron patch = 1 V runnof via Peroneal; c) 01/2014: R femoropopliteal bypass with left femoral vein  . Essential hypertension   . Hyperlipidemia with target LDL less than 70   . Smoker unmotivated to quit     Reportedly he came close to having a nervous breakdown when he last tried to quit  . 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 History of DVT of lower extremity   . Chronic pain     Feet & lower legs  . Depression   . Insomnia secondary to anxiety     takes Ambien    Social History History  Substance Use Topics  . Smoking status: Current Every Day Smoker -- 0.60 packs/day for 55 years    Types: Cigarettes  . Smokeless tobacco: Never Used  . Alcohol Use: No  Family History Family History  Problem Relation Age of Onset  . Other Mother 65    Abdominal Aortic Aneurysm   . Stroke Mother   . Alzheimer's disease Father 54  . Stroke Father     Past Surgical History  Procedure Laterality Date  . Abdominal aortogram  09/16/2008    bilateral lower extremity runoff arteriography  . Extremity arteriogram  09/16/2008    right lower  . Right femoral popliteral bypass  10/24/2008    6-mm Gore Tex Graft  . Thrombectomy      right femoral to above knee popliteral Gore-Tex graft  . Angioplasty      Dacron patch of Gore-Tex and saphenour vein junction and above knee popliteral artery  .  Coronary artery bypass graft  07/2002    LIMA-LAD, freeRIMA-RI,SVG-OM2, SVG-rPDA  . Femoral-tibial bypass graft Right 01/10/2014    Procedure: Right Femoral to Posterior Tibial Bypass Graft using Left Leg Vein, Thrombectomy Right Common Femoral and Profunda of Leg . ;  Surgeon: Rosetta Posner, MD;  Location: [facility];  Service: Vascular;  Laterality: Right;  . Coronary artery bypass graft  2004    x4  . Lower lobectomy Right October 2012    With mediastinal node dissection  . Cardiac catheterization  January 2004    60-70% ostial left main, 50% midCx, 60-70% mid RI, 70, 90 and 75% mid-to distal RCA with 95% PLA  . Left heart catheterization with coronary/graft angiogram N/A 07/12/2014    Procedure: LEFT HEART CATHETERIZATION WITH Beatrix Fetters;  Surgeon: Troy Sine, MD;  Location: [facility] CATH LAB;  Service: Cardiovascular;  Laterality: N/A;    Allergies  Allergen Reactions  . Aripiprazole     "makes him wild"  . Aspirin     Causes GI Bleed  . Crestor [Rosuvastatin Calcium] Itching  . Effexor [Venlafaxine Hydrochloride] Itching  . Lipitor [Atorvastatin Calcium] Itching  . Morphine And Related Itching  . Zetia [Ezetimibe]   . Zocor [Simvastatin - High Dose] Itching    Current Outpatient Prescriptions  Medication Sig Dispense Refill  . albuterol (PROVENTIL HFA;VENTOLIN HFA) 108 (90 BASE) MCG/ACT inhaler Inhale 1 puff into the lungs every 6 (six) hours as needed for wheezing or shortness of breath.    Marland Kitchen albuterol (PROVENTIL) (5 MG/ML) 0.5% nebulizer solution Take 2.5 mg by nebulization every 6 (six) hours as needed for wheezing or shortness of breath.    . budesonide-formoterol (SYMBICORT) 160-4.5 MCG/ACT inhaler Inhale 2 puffs into the lungs 2 (two) times daily.    . clonazePAM (KLONOPIN) 1 MG tablet Take 1 mg by mouth 2 (two) times daily as needed for anxiety.    . DULoxetine (CYMBALTA) 60 MG capsule Take 60 mg by mouth daily.     Marland Kitchen gabapentin (NEURONTIN) 100 MG capsule Take 100 mg  by mouth at bedtime.    Marland Kitchen HYDROcodone-acetaminophen (NORCO/VICODIN) 5-325 MG per tablet Take 1-2 tablets by mouth every 4 (four) hours as needed for moderate pain. 30 tablet 0  . nitroGLYCERIN (NITROSTAT) 0.4 MG SL tablet Place 1 tablet (0.4 mg total) under the tongue every 5 (five) minutes as needed for chest pain. 25 tablet 6  . OXYGEN Inhale into the lungs at bedtime.    . prazosin (MINIPRESS) 1 MG capsule Take 1 mg by mouth at bedtime.    Marland Kitchen QUEtiapine (SEROQUEL XR) 300 MG 24 hr tablet Take 300 mg by mouth at bedtime.     . ramipril (ALTACE) 10 MG capsule Take 10 mg by  mouth daily.    Marland Kitchen tiotropium (SPIRIVA) 18 MCG inhalation capsule Place 18 mcg into inhaler and inhale daily.    . traZODone (DESYREL) 150 MG tablet Take 150 mg by mouth at bedtime.    Marland Kitchen zolpidem (AMBIEN CR) 12.5 MG CR tablet Take 12.5 mg by mouth at bedtime.       No current facility-administered medications for this visit.    ROS: See HPI for pertinent positives and negatives.   Physical Examination  Filed Vitals:   01/03/15 1534  BP: 129/80  Pulse: 65  Resp: 16  Height: 6' (1.829 m)  Weight: 217 lb (98.431 kg)  SpO2: 96%   Body mass index is 29.42 kg/(m^2).   General: A&O x 3, WDWN. Gait: normal Eyes: Pupils are = Pulmonary: limited air movement in all fields, rales in both bases, rhonchi in left posterior fields. He has pursed lips expirations at rest.  Cardiac: regular Rythm , without detected murmur.     Carotid Bruits Left Right   Negative Negative  Aorta is not palpable. Radial pulses: are 1+ palpable   VASCULAR EXAM: Extremities without ischemic changes  without Gangrene; surgical incisions are now healed. Trace bilateral pitting and non pitting edema in feet and ankles.     LE Pulses LEFT RIGHT   FEMORAL   palpable  palpable    POPLITEAL  palpable  not palpable   POSTERIOR TIBIAL  palpable  palpable    DORSALIS PEDIS  ANTERIOR TIBIAL monophasic by Doppler and not palpable   Biphasic by Doppler and not palpable    Abdomen: soft, NT, no palpable masses. Skin: no rashes, no ulcers, see extremities. Musculoskeletal: no muscle wasting or atrophy. Neurologic: A&O X 3; Appropriate Affect,; MOTOR FUNCTION: moving all extremities equally, motor strength 5/5 throughout. Speech is fluent/normal. CN 2-12 intact except he is hard of hearing.           Non-Invasive Vascular Imaging: DATE: 01/03/2015 LOWER EXTREMITY ARTERIAL DUPLEX EVALUATION    INDICATION: Right lower extremity graft    PREVIOUS INTERVENTION(S): Two previously failed right bypass grafts with placement of new femorotibial graft 01/10/2014; right CFA and PFA endarterectomy at same time.    DUPLEX EXAM:     RIGHT  LEFT   Peak Systolic Velocity (cm/s) Ratio (if abnormal) Waveform  Peak Systolic Velocity (cm/s) Ratio (if abnormal) Waveform  256  T Inflow Artery     34  B Proximal Anastomosis     41  B Proximal Graft     58  B Mid Graft     60  B  Distal Graft     114  T Distal Anastomosis     91  B Outflow Artery     1.19 Today's ABI / TBI 1.08  1.04 Previous ABI / TBI (06/28/2014 ) 0.94    Waveform:    M - Monophasic       B - Biphasic       T - Triphasic  If Ankle Brachial Index (ABI) or Toe Brachial Index (TBI) performed, please see complete report     ADDITIONAL FINDINGS: Arteries of the left lower extremity were examined with velocities listed on the attached diagram.     IMPRESSION: 1. Widely patent right femorotibial graft without evidence of restenosis or hyperplasia; significant (50%-74%) stenosis is observed in the native common femoral artery inflow. 2. Significant (50%-74%) focal stenosis is observed in the left mid superficial femoral artery.    Compared to the previous  exam:  No change in the right graft.      ASSESSMENT: John Ball is a 74 y.o. male who is status post extensive prior femoropopliteal bypasses on his right leg. He had initially had a right femoral to above-knee popliteal bypass in April 2010. This was followed by right above-knee to below-knee popliteal bypass in August of 2011. He presented with occlusion of this and underwent thrombectomy and revision of his femoral-popliteal bypass in April of 2012.  He is also s/p right common femoral and profundus femoris artery thrombectomy and endarterectomy; right femoral to posterior tibial bypass with saphenous vein harvested from left leg on 01/10/14. He no longer has claudication symptoms with walking, no signs of ischemia in his lower extremities. His walking is limited by dyspnea, apparently from severe COPD; also has a history of lung cancer with right lobectomy.  Today's right LE arterial Duplex suggests a widely patent right femorotibial graft without evidence of restenosis or hyperplasia; significant (50%-74%) stenosis is observed in the native common femoral artery inflow. Significant (50%-74%) focal stenosis is observed in the left mid superficial femoral artery. No change in the right graft from 06/28/14 Duplex.  Fortunately he does not have DM, but unfortunately he continues to smoke and is not receptive to quitting.   PLAN:  The patient was counseled re smoking cessation and given several free resources re smoking cessation.  Graduated walking program advised and pt instructed how to accomplish this.   Based on the patient's vascular studies and examination, pt will return to clinic in 6 months with ABI's and right LE arterial Duplex.   I discussed in depth with the patient the nature of atherosclerosis, and emphasized the importance of maximal medical management including strict control of blood pressure, blood glucose, and lipid levels, obtaining regular exercise, and cessation  of smoking.  The patient is aware that without maximal medical management the underlying atherosclerotic disease process will progress, limiting the benefit of any interventions.  The patient was given information about PAD including signs, symptoms, treatment, what symptoms should prompt the patient to seek immediate medical care, and risk reduction measures to take.  Clemon Chambers, RN, MSN, FNP-C [facility] Phone: [phone]  Clinic MD: Early  01/03/2015 3:09 PM

## 2015-01-03 NOTE — Patient Instructions (Signed)
Peripheral Vascular Disease Peripheral Vascular Disease (PVD), also called Peripheral Arterial Disease (PAD), is a circulation problem caused by cholesterol (atherosclerotic plaque) deposits in the arteries. PVD commonly occurs in the lower extremities (legs) but it can occur in other areas of the body, such as your arms. The cholesterol buildup in the arteries reduces blood flow which can cause pain and other serious problems. The presence of PVD can place a person at risk for Coronary Artery Disease (CAD).  CAUSES  Causes of PVD can be many. It is usually associated with more than one risk factor such as:   High Cholesterol.  Smoking.  Diabetes.  Lack of exercise or inactivity.  High blood pressure (hypertension).  Obesity.  Family history. SYMPTOMS   When the lower extremities are affected, patients with PVD may experience:  Leg pain with exertion or physical activity. This is called INTERMITTENT CLAUDICATION. This may present as cramping or numbness with physical activity. The location of the pain is associated with the level of blockage. For example, blockage at the abdominal level (distal abdominal aorta) may result in buttock or hip pain. Lower leg arterial blockage may result in calf pain.  As PVD becomes more severe, pain can develop with less physical activity.  In people with severe PVD, leg pain may occur at rest.  Other PVD signs and symptoms:  Leg numbness or weakness.  Coldness in the affected leg or foot, especially when compared to the other leg.  A change in leg color.  Patients with significant PVD are more prone to ulcers or sores on toes, feet or legs. These may take longer to heal or may reoccur. The ulcers or sores can become infected.  If signs and symptoms of PVD are ignored, gangrene may occur. This can result in the loss of toes or loss of an entire limb.  Not all leg pain is related to PVD. Other medical conditions can cause leg pain such  as:  Blood clots (embolism) or Deep Vein Thrombosis.  Inflammation of the blood vessels (vasculitis).  Spinal stenosis. DIAGNOSIS  Diagnosis of PVD can involve several different types of tests. These can include:  Pulse Volume Recording Method (PVR). This test is simple, painless and does not involve the use of X-rays. PVR involves measuring and comparing the blood pressure in the arms and legs. An ABI (Ankle-Brachial Index) is calculated. The normal ratio of blood pressures is 1. As this number becomes smaller, it indicates more severe disease.  < 0.95 - indicates significant narrowing in one or more leg vessels.  <0.8 - there will usually be pain in the foot, leg or buttock with exercise.  <0.4 - will usually have pain in the legs at rest.  <0.25 - usually indicates limb threatening PVD.  Doppler detection of pulses in the legs. This test is painless and checks to see if you have a pulses in your legs/feet.  A dye or contrast material (a substance that highlights the blood vessels so they show up on x-ray) may be given to help your caregiver better see the arteries for the following tests. The dye is eliminated from your body by the kidney's. Your caregiver may order blood work to check your kidney function and other laboratory values before the following tests are performed:  Magnetic Resonance Angiography (MRA). An MRA is a picture study of the blood vessels and arteries. The MRA machine uses a large magnet to produce images of the blood vessels.  Computed Tomography Angiography (CTA). A CTA   is a specialized x-ray that looks at how the blood flows in your blood vessels. An IV may be inserted into your arm so contrast dye can be injected.  Angiogram. Is a procedure that uses x-rays to look at your blood vessels. This procedure is minimally invasive, meaning a small incision (cut) is made in your groin. A small tube (catheter) is then inserted into the artery of your groin. The catheter  is guided to the blood vessel or artery your caregiver wants to examine. Contrast dye is injected into the catheter. X-rays are then taken of the blood vessel or artery. After the images are obtained, the catheter is taken out. TREATMENT  Treatment of PVD involves many interventions which may include:  Lifestyle changes:  Quitting smoking.  Exercise.  Following a low fat, low cholesterol diet.  Control of diabetes.  Foot care is very important to the PVD patient. Good foot care can help prevent infection.  Medication:  Cholesterol-lowering medicine.  Blood pressure medicine.  Anti-platelet drugs.  Certain medicines may reduce symptoms of Intermittent Claudication.  Interventional/Surgical options:  Angioplasty. An Angioplasty is a procedure that inflates a balloon in the blocked artery. This opens the blocked artery to improve blood flow.  Stent Implant. A wire mesh tube (stent) is placed in the artery. The stent expands and stays in place, allowing the artery to remain open.  Peripheral Bypass Surgery. This is a surgical procedure that reroutes the blood around a blocked artery to help improve blood flow. This type of procedure may be performed if Angioplasty or stent implants are not an option. SEEK IMMEDIATE MEDICAL CARE IF:   You develop pain or numbness in your arms or legs.  Your arm or leg turns cold, becomes blue in color.  You develop redness, warmth, swelling and pain in your arms or legs. MAKE SURE YOU:   Understand these instructions.  Will watch your condition.  Will get help right away if you are not doing well or get worse. Document Released: 08/01/2004 Document Revised: 09/16/2011 Document Reviewed: 06/28/2008 ExitCare Patient Information 2015 [employer]. This information is not intended to replace advice given to you by your health care provider. Make sure you discuss any questions you have with your health care provider.    Smoking  Cessation Quitting smoking is important to your health and has many advantages. However, it is not always easy to quit since nicotine is a very addictive drug. Oftentimes, people try 3 times or more before being able to quit. This document explains the best ways for you to prepare to quit smoking. Quitting takes hard work and a lot of effort, but you can do it. ADVANTAGES OF QUITTING SMOKING  You will live longer, feel better, and live better.  Your body will feel the impact of quitting smoking almost immediately.  Within 20 minutes, blood pressure decreases. Your pulse returns to its normal level.  After 8 hours, carbon monoxide levels in the blood return to normal. Your oxygen level increases.  After 24 hours, the chance of having a heart attack starts to decrease. Your breath, hair, and body stop smelling like smoke.  After 48 hours, damaged nerve endings begin to recover. Your sense of taste and smell improve.  After 72 hours, the body is virtually free of nicotine. Your bronchial tubes relax and breathing becomes easier.  After 2 to 12 weeks, lungs can hold more air. Exercise becomes easier and circulation improves.  The risk of having a heart attack, stroke,   cancer, or lung disease is greatly reduced.  After 1 year, the risk of coronary heart disease is cut in half.  After 5 years, the risk of stroke falls to the same as a nonsmoker.  After 10 years, the risk of lung cancer is cut in half and the risk of other cancers decreases significantly.  After 15 years, the risk of coronary heart disease drops, usually to the level of a nonsmoker.  If you are pregnant, quitting smoking will improve your chances of having a healthy baby.  The people you live with, especially any children, will be healthier.  You will have extra money to spend on things other than cigarettes. QUESTIONS TO THINK ABOUT BEFORE ATTEMPTING TO QUIT You may want to talk about your answers with your health care  provider.  Why do you want to quit?  If you tried to quit in the past, what helped and what did not?  What will be the most difficult situations for you after you quit? How will you plan to handle them?  Who can help you through the tough times? Your family? Friends? A health care provider?  What pleasures do you get from smoking? What ways can you still get pleasure if you quit? Here are some questions to ask your health care provider:  How can you help me to be successful at quitting?  What medicine do you think would be best for me and how should I take it?  What should I do if I need more help?  What is smoking withdrawal like? How can I get information on withdrawal? GET READY  Set a quit date.  Change your environment by getting rid of all cigarettes, ashtrays, matches, and lighters in your home, car, or work. Do not let people smoke in your home.  Review your past attempts to quit. Think about what worked and what did not. GET SUPPORT AND ENCOURAGEMENT You have a better chance of being successful if you have help. You can get support in many ways.  Tell your family, friends, and coworkers that you are going to quit and need their support. Ask them not to smoke around you.  Get individual, group, or telephone counseling and support. Programs are available at local hospitals and health centers. Call your local health department for information about programs in your area.  Spiritual beliefs and practices may help some smokers quit.  Download a "quit meter" on your computer to keep track of quit statistics, such as how long you have gone without smoking, cigarettes not smoked, and money saved.  Get a self-help book about quitting smoking and staying off tobacco. LEARN NEW SKILLS AND BEHAVIORS  Distract yourself from urges to smoke. Talk to someone, go for a walk, or occupy your time with a task.  Change your normal routine. Take a different route to work. Drink tea  instead of coffee. Eat breakfast in a different place.  Reduce your stress. Take a hot bath, exercise, or read a book.  Plan something enjoyable to do every day. Reward yourself for not smoking.  Explore interactive web-based programs that specialize in helping you quit. GET MEDICINE AND USE IT CORRECTLY Medicines can help you stop smoking and decrease the urge to smoke. Combining medicine with the above behavioral methods and support can greatly increase your chances of successfully quitting smoking.  Nicotine replacement therapy helps deliver nicotine to your body without the negative effects and risks of smoking. Nicotine replacement therapy includes nicotine gum, lozenges,   inhalers, nasal sprays, and skin patches. Some may be available over-the-counter and others require a prescription.  Antidepressant medicine helps people abstain from smoking, but how this works is unknown. This medicine is available by prescription.  Nicotinic receptor partial agonist medicine simulates the effect of nicotine in your brain. This medicine is available by prescription. Ask your health care provider for advice about which medicines to use and how to use them based on your health history. Your health care provider will tell you what side effects to look out for if you choose to be on a medicine or therapy. Carefully read the information on the package. Do not use any other product containing nicotine while using a nicotine replacement product.  RELAPSE OR DIFFICULT SITUATIONS Most relapses occur within the first 3 months after quitting. Do not be discouraged if you start smoking again. Remember, most people try several times before finally quitting. You may have symptoms of withdrawal because your body is used to nicotine. You may crave cigarettes, be irritable, feel very hungry, cough often, get headaches, or have difficulty concentrating. The withdrawal symptoms are only temporary. They are strongest when you  first quit, but they will go away within 10-14 days. To reduce the chances of relapse, try to:  Avoid drinking alcohol. Drinking lowers your chances of successfully quitting.  Reduce the amount of caffeine you consume. Once you quit smoking, the amount of caffeine in your body increases and can give you symptoms, such as a rapid heartbeat, sweating, and anxiety.  Avoid smokers because they can make you want to smoke.  Do not let weight gain distract you. Many smokers will gain weight when they quit, usually less than 10 pounds. Eat a healthy diet and stay active. You can always lose the weight gained after you quit.  Find ways to improve your mood other than smoking. FOR MORE INFORMATION  www.smokefree.gov  Document Released: 06/18/2001 Document Revised: 11/08/2013 Document Reviewed: 10/03/2011 ExitCare Patient Information 2015 [employer]. This information is not intended to replace advice given to you by your health care provider. Make sure you discuss any questions you have with your health care provider.    Smoking Cessation, Tips for Success If you are ready to quit smoking, congratulations! You have chosen to help yourself be healthier. Cigarettes bring nicotine, tar, carbon monoxide, and other irritants into your body. Your lungs, heart, and blood vessels will be able to work better without these poisons. There are many different ways to quit smoking. Nicotine gum, nicotine patches, a nicotine inhaler, or nicotine nasal spray can help with physical craving. Hypnosis, support groups, and medicines help break the habit of smoking. WHAT THINGS CAN I DO TO MAKE QUITTING EASIER?  Here are some tips to help you quit for good:  Pick a date when you will quit smoking completely. Tell all of your friends and family about your plan to quit on that date.  Do not try to slowly cut down on the number of cigarettes you are smoking. Pick a quit date and quit smoking completely starting on that  day.  Throw away all cigarettes.   Clean and remove all ashtrays from your home, work, and car.  On a card, write down your reasons for quitting. Carry the card with you and read it when you get the urge to smoke.  Cleanse your body of nicotine. Drink enough water and fluids to keep your urine clear or pale yellow. Do this after quitting to flush the nicotine from   your body.  Learn to predict your moods. Do not let a bad situation be your excuse to have a cigarette. Some situations in your life might tempt you into wanting a cigarette.  Never have "just one" cigarette. It leads to wanting another and another. Remind yourself of your decision to quit.  Change habits associated with smoking. If you smoked while driving or when feeling stressed, try other activities to replace smoking. Stand up when drinking your coffee. Brush your teeth after eating. Sit in a different chair when you read the paper. Avoid alcohol while trying to quit, and try to drink fewer caffeinated beverages. Alcohol and caffeine may urge you to smoke.  Avoid foods and drinks that can trigger a desire to smoke, such as sugary or spicy foods and alcohol.  Ask people who smoke not to smoke around you.  Have something planned to do right after eating or having a cup of coffee. For example, plan to take a walk or exercise.  Try a relaxation exercise to calm you down and decrease your stress. Remember, you may be tense and nervous for the first 2 weeks after you quit, but this will pass.  Find new activities to keep your hands busy. Play with a pen, coin, or rubber band. Doodle or draw things on paper.  Brush your teeth right after eating. This will help cut down on the craving for the taste of tobacco after meals. You can also try mouthwash.   Use oral substitutes in place of cigarettes. Try using lemon drops, carrots, cinnamon sticks, or chewing gum. Keep them handy so they are available when you have the urge to  smoke.  When you have the urge to smoke, try deep breathing.  Designate your home as a nonsmoking area.  If you are a heavy smoker, ask your health care provider about a prescription for nicotine chewing gum. It can ease your withdrawal from nicotine.  Reward yourself. Set aside the cigarette money you save and buy yourself something nice.  Look for support from others. Join a support group or smoking cessation program. Ask someone at home or at work to help you with your plan to quit smoking.  Always ask yourself, "Do I need this cigarette or is this just a reflex?" Tell yourself, "Today, I choose not to smoke," or "I do not want to smoke." You are reminding yourself of your decision to quit.  Do not replace cigarette smoking with electronic cigarettes (commonly called e-cigarettes). The safety of e-cigarettes is unknown, and some may contain harmful chemicals.  If you relapse, do not give up! Plan ahead and think about what you will do the next time you get the urge to smoke. HOW WILL I FEEL WHEN I QUIT SMOKING? You may have symptoms of withdrawal because your body is used to nicotine (the addictive substance in cigarettes). You may crave cigarettes, be irritable, feel very hungry, cough often, get headaches, or have difficulty concentrating. The withdrawal symptoms are only temporary. They are strongest when you first quit but will go away within 10-14 days. When withdrawal symptoms occur, stay in control. Think about your reasons for quitting. Remind yourself that these are signs that your body is healing and getting used to being without cigarettes. Remember that withdrawal symptoms are easier to treat than the major diseases that smoking can cause.  Even after the withdrawal is over, expect periodic urges to smoke. However, these cravings are generally short lived and will go away whether you   smoke or not. Do not smoke! WHAT RESOURCES ARE AVAILABLE TO HELP ME QUIT SMOKING? Your health care  provider can direct you to community resources or hospitals for support, which may include:  Group support.  Education.  Hypnosis.  Therapy. Document Released: 03/22/2004 Document Revised: 11/08/2013 Document Reviewed: 12/10/2012 ExitCare Patient Information 2015 [employer]. This information is not intended to replace advice given to you by your health care provider. Make sure you discuss any questions you have with your health care provider.  

## 2015-01-04 NOTE — Addendum Note (Signed)
Addended by: Dorthula Rue L on: 01/04/2015 11:37 AM   Modules accepted: Orders

## 2015-01-10 ENCOUNTER — Encounter (HOSPITAL_BASED_OUTPATIENT_CLINIC_OR_DEPARTMENT_OTHER): Payer: Self-pay | Admitting: *Deleted

## 2015-01-10 NOTE — Progress Notes (Addendum)
SPOKE W/ WIFE.  NPO AFTER MN.  ARRIVE AT 0715.  NEEDS ISTAT.  CURRENT CXR AND EKG IN CHART AND EPIC.  WILL TAKE CYMBALTA, DO SPIRIVA AND SYMBICORT INHALERS AM DOS W/ SIPS OF WATER.  WILL BRING O2 MACHINE DOS.  ALSO, ADVISED SINCE PT HAS INCENTIVE SPIROMETER TO USE SEVERAL TIMES DAILY UNTIL DOS.  REVIEWED CHART W/ DR B. JUDD MDA,  STATED WILL REVIEW W/ DR DENENNY MDA, WILL GET BACK TO ME TODAY IF OK TO PROCEED AT THIS FACILITY.  DR B. JUDD SPOKE W/ DR [facility] MDA'S,  STATE THAT PT NEEDS CARDIAC CLEARANCE IN REGARDS OF BEING APPROPRIATE FOR THIS PROCEDURE AT THIS [facility] SETTING. ALSO, STATED THEY KNOW HE IS A INTERMEDIATE TO HIGH RISK GIVEN HIS EXTENSIVE HX AND PT WOULD NEED TO EXCEPT RISK BUR WOULD LIKE THERE OPINION WITH CLEARANCE.  CALLED AND LM FOR PAM, OR SCHEDULER FOR DR ESKRIDGE, ABOUT NEEDING CLEARANCE.

## 2015-01-11 ENCOUNTER — Ambulatory Visit (INDEPENDENT_AMBULATORY_CARE_PROVIDER_SITE_OTHER): Payer: Medicare Other | Admitting: Cardiovascular Disease

## 2015-01-11 ENCOUNTER — Telehealth: Payer: Self-pay | Admitting: Urology

## 2015-01-11 ENCOUNTER — Telehealth: Payer: Self-pay | Admitting: Physician Assistant

## 2015-01-11 ENCOUNTER — Encounter: Payer: Self-pay | Admitting: Cardiovascular Disease

## 2015-01-11 ENCOUNTER — Encounter (HOSPITAL_BASED_OUTPATIENT_CLINIC_OR_DEPARTMENT_OTHER): Payer: Self-pay | Admitting: Anesthesiology

## 2015-01-11 VITALS — BP 126/78 | HR 74 | Ht 72.0 in | Wt 215.0 lb

## 2015-01-11 DIAGNOSIS — I25709 Atherosclerosis of coronary artery bypass graft(s), unspecified, with unspecified angina pectoris: Secondary | ICD-10-CM

## 2015-01-11 DIAGNOSIS — I10 Essential (primary) hypertension: Secondary | ICD-10-CM | POA: Diagnosis not present

## 2015-01-11 NOTE — Telephone Encounter (Signed)
Wrong provide

## 2015-01-11 NOTE — Telephone Encounter (Signed)
Scheduling looking for an appointment date for this patient

## 2015-01-11 NOTE — Telephone Encounter (Signed)
Pt. To see Dr. Gwenlyn Found today at 4:15 pm

## 2015-01-11 NOTE — Progress Notes (Signed)
Patient came in to be seen for surgical clearance after being added on Dr Kennon Holter schedule.  The patient became very loud and angry about having to wait for Dr Gwenlyn Found to enter the room.  He raised his voice and used hand motions to jesture me out of the room and usher Dr Gwenlyn Found in the room.  Dr Gwenlyn Found entered the room.  The patient continued to talk in a loud and angry voice saying that he had waited long enough and we weren't doing him any favors.  He voiced that he did not care if we cleared him or not.  Dr Gwenlyn Found promptly stood up and left the room without seeing the patient.  Dr Gwenlyn Found did NOT clear the patient for surgery and will not see the patient again.

## 2015-01-11 NOTE — Telephone Encounter (Signed)
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 This request is coming from the anesthesia. Please fax to [employer] at [phone].

## 2015-01-11 NOTE — Anesthesia Preprocedure Evaluation (Deleted)
Anesthesia Evaluation    Airway        Dental   Pulmonary Current Smoker,           Cardiovascular hypertension,      Neuro/Psych    GI/Hepatic   Endo/Other    Renal/GU      Musculoskeletal   Abdominal   Peds  Hematology   Anesthesia Other Findings   Reproductive/Obstetrics                             Anesthesia Physical Anesthesia Plan Anesthesia Quick Evaluation  

## 2015-01-12 NOTE — Progress Notes (Signed)
After reading last office visit note, left message for Pam, or scheduler for Dr. Junious Silk.  Pt didn't receive cardiac clearance.  Will have to be rescheduled or moved to the main.

## 2015-01-13 ENCOUNTER — Ambulatory Visit (HOSPITAL_BASED_OUTPATIENT_CLINIC_OR_DEPARTMENT_OTHER): Admission: RE | Admit: 2015-01-13 | Payer: Medicare Other | Source: Ambulatory Visit | Admitting: Urology

## 2015-01-13 HISTORY — DX: Cough: R05

## 2015-01-13 HISTORY — DX: Complete loss of teeth, unspecified cause, unspecified class: K08.109

## 2015-01-13 HISTORY — DX: Presence of spectacles and contact lenses: Z97.3

## 2015-01-13 HISTORY — DX: Presence of other vascular implants and grafts: Z95.828

## 2015-01-13 HISTORY — DX: Unspecified osteoarthritis, unspecified site: M19.90

## 2015-01-13 HISTORY — DX: Anodontia: K00.0

## 2015-01-13 HISTORY — DX: Unspecified hearing loss, bilateral: H91.93

## 2015-01-13 HISTORY — DX: Obstructive sleep apnea (adult) (pediatric): G47.33

## 2015-01-13 HISTORY — DX: Other specified cough: R05.8

## 2015-01-13 HISTORY — DX: Chronic obstructive pulmonary disease, unspecified: J44.9

## 2015-01-13 HISTORY — DX: Polyneuropathy, unspecified: G62.9

## 2015-01-13 HISTORY — DX: Anxiety disorder, unspecified: F41.9

## 2015-01-13 HISTORY — DX: Personal history of other diseases of the circulatory system: Z86.79

## 2015-01-13 HISTORY — DX: Personal history of other mental and behavioral disorders: Z86.59

## 2015-01-13 HISTORY — DX: Other complications of anesthesia, initial encounter: T88.59XA

## 2015-01-13 HISTORY — DX: Nocturia: R35.1

## 2015-01-13 HISTORY — DX: Adverse effect of unspecified anesthetic, initial encounter: T41.45XA

## 2015-01-13 HISTORY — DX: Post-traumatic stress disorder, unspecified: F43.10

## 2015-01-13 SURGERY — TURBT (TRANSURETHRAL RESECTION OF BLADDER TUMOR)
Anesthesia: General

## 2015-01-24 ENCOUNTER — Ambulatory Visit: Payer: Medicare Other | Admitting: Cardiology

## 2015-01-26 ENCOUNTER — Encounter: Payer: Self-pay | Admitting: Cardiology

## 2015-01-26 ENCOUNTER — Ambulatory Visit (INDEPENDENT_AMBULATORY_CARE_PROVIDER_SITE_OTHER): Payer: Medicare Other | Admitting: Cardiology

## 2015-01-26 VITALS — BP 124/78 | HR 83 | Ht 72.0 in | Wt 216.0 lb

## 2015-01-26 DIAGNOSIS — I779 Disorder of arteries and arterioles, unspecified: Secondary | ICD-10-CM

## 2015-01-26 DIAGNOSIS — I10 Essential (primary) hypertension: Secondary | ICD-10-CM | POA: Diagnosis not present

## 2015-01-26 DIAGNOSIS — E785 Hyperlipidemia, unspecified: Secondary | ICD-10-CM

## 2015-01-26 DIAGNOSIS — I2510 Atherosclerotic heart disease of native coronary artery without angina pectoris: Secondary | ICD-10-CM

## 2015-01-26 DIAGNOSIS — Z951 Presence of aortocoronary bypass graft: Secondary | ICD-10-CM

## 2015-01-26 DIAGNOSIS — Z01810 Encounter for preprocedural cardiovascular examination: Secondary | ICD-10-CM

## 2015-01-26 DIAGNOSIS — Z79899 Other long term (current) drug therapy: Secondary | ICD-10-CM

## 2015-01-26 DIAGNOSIS — I25810 Atherosclerosis of coronary artery bypass graft(s) without angina pectoris: Secondary | ICD-10-CM

## 2015-01-26 DIAGNOSIS — F17200 Nicotine dependence, unspecified, uncomplicated: Secondary | ICD-10-CM

## 2015-01-26 DIAGNOSIS — F17210 Nicotine dependence, cigarettes, uncomplicated: Secondary | ICD-10-CM

## 2015-01-26 NOTE — Patient Instructions (Addendum)
PATIENT HAS CARDIAC CLEARANCE FOR SURGERY WITH DR [facility]  (low risk /low procedure) Letter was routed to Dr Junious Silk.  LABS- CMP LIPID in 6 month will mail letter in 5 months.   Your physician wants you to follow-up in [place] 30 min appointment.  You will receive a reminder letter in the mail two months in advance. If you don't receive a letter, please call our office to schedule the follow-up appointment.

## 2015-01-27 ENCOUNTER — Other Ambulatory Visit: Payer: Self-pay | Admitting: Urology

## 2015-01-28 ENCOUNTER — Encounter: Payer: Self-pay | Admitting: Cardiology

## 2015-01-28 DIAGNOSIS — Z01810 Encounter for preprocedural cardiovascular examination: Secondary | ICD-10-CM | POA: Insufficient documentation

## 2015-01-28 DIAGNOSIS — Z7189 Other specified counseling: Secondary | ICD-10-CM | POA: Insufficient documentation

## 2015-01-28 NOTE — Assessment & Plan Note (Signed)
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 This correlates with his Myoview. I don't really know when this happened, [place] may have [place] Regardless, he has not had recurrent symptoms. Unfortunately we are limited in our medical management. He is on an ACE inhibitor, but unfortunately not aspirin, statin or beta blocker for reasons noted above. In the past without about Plavix and he was resistant to use Plavix or aspirin.

## 2015-01-28 NOTE — Assessment & Plan Note (Signed)
He is not to try to quit based on prior experience.

## 2015-01-28 NOTE — Assessment & Plan Note (Signed)
Statin "allergy ". Intolerant to his Zetia. We'll need to check lipids prior to next visit. Consider evaluation for PCSK9 inhibition.

## 2015-01-28 NOTE — Assessment & Plan Note (Signed)
Well-controlled on ACE inhibitor.

## 2015-01-28 NOTE — Progress Notes (Signed)
PCP: Shamleffer, Herschell Dimes, MD  Clinic Note: Chief Complaint  Patient presents with  . NEED CARDIAC CLEARANCE    Patient has no compliants.  . Coronary Artery Disease  . PAD  . Nicotine Dependence    No interest in quitting    HPI: John Ball is a 74 y.o. male with a PMH (CAD status post CABG, HTN, HLD, smoking with COPD; below who presents today for delayed follow-up visit and now for preoperative risk evaluation for urologic surgery.  John Ball was last seen on 12/12/2014 for preoperative evaluation for urologic surgery to remove a bladder polyp/mass, but became quite agitated and upset after having to wait for Dr. Gwenlyn Found for preoperative evaluation. That visit did not amount to an evaluation. He had been former patient of Dr. Gwenlyn Found and then Dr. Debara Pickett. He had had a negative stress test in August 2011 with a relatively normal echocardiogram at that time.  He does not take aspirin because of GI issues and bruising. He does not take statin because of itching and refuses to take any. He is not on beta blocker due to COPD but does take an ACE inhibitor. He does not have diabetes, but does continue to smoke with no interest in quitting.  I talked to him briefly about smoking sedation, and he simply laughed.  I last saw him in November 2015 and he was noticing a couple episodes of chest discomfort that were with minimal exertion lasting roughly 10 minutes. They resolved with aspirin. The symptoms were evaluated with a nuclear stress test on December 11 which was read as an INTERMEDIATE RISK study. Because I was out of town, he was called back to see John Sabina, PA-C for precath evaluation. He underwent cardiac catheterization by Dr. Claiborne Billings on 07/12/2014 revealing occluded native RCA with occluded vein graft to the RCA. No PCI was performed as this finding correlated well with the stress test findings.  Recent Hospitalizations: Jul 12, 2014 - for cardiac catheterization  Studies  Reviewed:  Myoview 06/17/2014:  moderate size, moderate intensity, partially reversible inferior septal defect consistent with prior infarct with mild to moderate peri-infarct ischemia and mild hypokinesis of the inferoseptal wall. This was read as an INTERMEDIATE RISK study.  Cardiac Catheterization 07/12/2014:  pLM 70%  LAD: Proximal 40-50; competitive filling from a patent LIMA-LAD graft  RI: Competitive flow from widely patent free RIMA-RI graft  Cx: Normal caliber, moderate OM1. OM 2 not visualized.  SVG-OM 2 widely patent  RCA: 100% CTO after large RVM  -- Faint R-R and LAD septal-the PDA collaterals    SVG-RCA 100% CTO  LVEF 4550% with mid-basal inferior HK  Medical management as the identified lesion correlated with stress test   LEA Dopplers 6/'16 IMPRESSION: 2. Widely patent right femorotibial graft without evidence of restenosis or hyperplasia; significant (50%-74%) stenosis is observed in the native common femoral artery inflow. 3. Significant (50%-74%) focal stenosis is observed in the left mid superficial femoral artery.    Compared to the previous exam:  No change in the right graft.          Interval History: John Ball presents today in the slightly better mood and without any major cardiac complaints. He recently saw Clemon Chambers, NP from vascular surgery on June 28.  His arterial Dopplers are relatively stable as indicated above. He was noted to have some mild nonlimiting claudication symptoms.   From a cardiac standpoint, he denies any resting or exertional chest tightness or pressure. He  does have baseline exertional dyspnea related to COPD from long-term smoking, but has not noted any worsening symptoms. No PND or orthopnea but does have mild lower extremity edema that is relieved with foot elevation. No palpitations, lightheadedness, dizziness, weakness or syncope/near syncope. No TIA/amaurosis fugax symptoms. No melena, hematochezia, hematuria, or  epstaxis.  Mostly because of his baseline exertional dyspnea from COPD, he really doesn't do much exertion, but he is able to walk around the block without having anginal symptoms with profound dyspnea. He is able to achieve at least 6 METS without active symptoms. In fact other than the couple episodes of just discomfort that he had back in November 2015, he is not had any recurrent symptoms.   Past Medical History  Diagnosis Date  . 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 Essentially normal echo  . 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 Diaphragmatic attenuation  . Essential hypertension   . Hyperlipidemia with target LDL less than 70   . Smoker unmotivated to quit     Reportedly he came close to having a nervous breakdown when he last tried to quit  . History of DVT of lower extremity   . Depression   . CAD (coronary artery disease)     cardiologist-  dr kelly/ dr berry  ----  s/p  cabg x4 in 2004  w/ severe LM disease  . COPD, severe   . Arthritis   . Peripheral arterial occlusive disease followed by dr Early--  per last dopplers 06-28-2014 no change right graft,  50-74% stenosis common and left mid superficial femoral artery    2011   a) Gore-Tex graft right AK popA-BK popA --> b) 2012: thrombosed graft --> thrombectomy with dacron patch = 1 V runnof via Peroneal; c) 01/2014: R femoropopliteal bypass with left femoral vein  . 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 History of arterial bypass of lower extremity     RIGHT FEM-POP  . Dyspnea on exertion     CHRONIC  . Recurrent productive cough     SMOKER'S COUGH  . PTSD (post-traumatic stress disorder)     anxiety attack's---  [place] [employer]  . History of panic attacks   . OSA (obstructive sleep apnea)     uses O2  via [place]  --  intolerate to CPAP  . Neuropathy, peripheral   . Nocturia   . History of CHF (congestive heart failure)   . Wears glasses   . Hearing loss of both ears     does wear his hearing aids  . No natural teeth     does not wear his dentures  .  Complication of anesthesia     hx prolonged post op oxygen dependent/  hx hallucinations for 2 day post op lobectomy 2012  . Anxiety     Past Surgical History  Procedure Laterality Date  . Femoral-tibial bypass graft Right 01/10/2014    Procedure: Right Femoral to Posterior Tibial Bypass Graft using Left Leg Vein, Thrombectomy Right Common Femoral and Profunda of Leg . ;  Surgeon: Rosetta Posner, MD;  Location: [facility];  Service: Vascular;  Laterality: Right;  . Left heart catheterization with coronary/graft angiogram N/A 07/12/2014    Procedure: LEFT HEART CATHETERIZATION WITH Beatrix Fetters;  Surgeon: Troy Sine, MD;  [facility];  Service: Cardiovascular;  Laterality: N/A;  mild LV dysfunction,  ef 45-50% , mild inferior hypocontractility; sig. native coronary obstuctive disease with left to right predominant septal collateralization;  occluded SVG to RCA graft , other graft's patent  . Coronary artery bypass graft  07-27-2002  dr Malvin Johns, freeRIMA-RI,SVG-OM2, SVG-rPDA  . Cardiovascular stress test  last one  06-17-2014   dr  Ellyn Hack    Carlton Adam study with no exercise; Intermediate Risk  Study;   moderate size and intensity, partially reversible inferior septal defect consistent with prior infarct and mild to moderate peri-infarct ischemia;  mild hypokinesis of the inferior septal wall,  normal LV function, ef 56%  . Tonsillectomy    . Femoral artery - popliteal artery bypass graft Right 10-24-2008   dr early    w/ GoreTex graft  . Right above knee popliteal graft to below knee popliteal artery bypass with reverse saphenous vein  03-02-2010  . Thrombectomy of right femoral to above knee popliteal goretex graft angioplasty of goretex and saphenous vein junction and above knee popliteal artery   10-06-2010   DR EARLY  . Video assisted thoracoscopy (vats)/wedge resection Right 05-01-2011    [facility]    LOWER LOBECTOMY W/  NODE DISSECTION  . Cardiac catheterization  07-13-2002    Ischemia RCA region on Cardiolite// 60-70% ostial left main, 50% midCx, 60-70% mid RI,  70% mRCA, 90% JeNu and crux 75% RCA ,  95% PLA//  severe LM and 3Vessel CAD, perserved LV, ef 60%  . Left ankle surgery  1990's  . Hydrocele excision Left   . Eye surgery Left Nov 2013  . Colonoscopy  2008    WNL    ROS: A comprehensive was performed. Review of Systems  Constitutional: Negative for malaise/fatigue.  Eyes: Positive for blurred vision.  Respiratory: Positive for cough (Morning cough is routine), sputum production (early in the morning, but not more than usual a.m. phlegm), shortness of breath (At baseline) and wheezing.   Cardiovascular: Positive for claudication (Not limiting).  Gastrointestinal: Negative for constipation, blood in stool and melena.  Genitourinary: Negative for hematuria.       Noted to have a bladder polyp  Neurological: Negative for focal weakness.       Peripheral neuropathy  Endo/Heme/Allergies: Bruises/bleeds easily (Less noticeable without aspirin).  All other systems reviewed and are negative.   Current Outpatient Prescriptions on File Prior to Visit  Medication Sig  Dispense Refill  . albuterol (PROVENTIL HFA;VENTOLIN HFA) 108 (90 BASE) MCG/ACT inhaler Inhale 1 puff into the lungs every 6 (six) hours as needed for wheezing or shortness of breath.    Marland Kitchen albuterol (PROVENTIL) (5 MG/ML) 0.5% nebulizer solution Take 2.5 mg by nebulization every 6 (six) hours as needed for wheezing or shortness of breath.    Marland Kitchen  clonazePAM (KLONOPIN) 1 MG tablet Take 1 mg by mouth 2 (two) times daily as needed for anxiety.    . DULoxetine (CYMBALTA) 60 MG capsule Take 60 mg by mouth every morning.     . gabapentin (NEURONTIN) 100 MG capsule Take 100 mg by mouth at bedtime.    . nitroGLYCERIN (NITROSTAT) 0.4 MG SL tablet Place 1 tablet (0.4 mg total) under the tongue every 5 (five) minutes as needed for chest pain. 25 tablet 6  . OXYGEN Inhale into the lungs at bedtime. Uses O2 -- intolerant to CPAP    . prazosin (MINIPRESS) 1 MG capsule Take 1 mg by mouth at bedtime.    Marland Kitchen QUEtiapine (SEROQUEL XR) 300 MG 24 hr tablet Take 300 mg by mouth at bedtime.     . ramipril (ALTACE) 10 MG capsule Take 10 mg by mouth every morning.     . tiotropium (SPIRIVA) 18 MCG inhalation capsule Place 18 mcg into inhaler and inhale every morning.     . traZODone (DESYREL) 150 MG tablet Take 150 mg by mouth at bedtime.    Marland Kitchen zolpidem (AMBIEN CR) 12.5 MG CR tablet Take 12.5 mg by mouth at bedtime.       No current facility-administered medications on file prior to visit.   Allergies  Allergen Reactions  . Aripiprazole Other (See Comments)    "makes him wild"  . Aspirin Other (See Comments)    Avoids -- bruises real bad  . Crestor [Rosuvastatin Calcium] Other (See Comments)    myalgia  . Effexor [Venlafaxine Hydrochloride] Itching  . Lipitor [Atorvastatin Calcium] Other (See Comments)    myalgia  . Morphine And Related Itching  . Zetia [Ezetimibe] Other (See Comments)    myalgia  . Zocor [Simvastatin - High Dose] Other (See Comments)    myalgia    History   Social History  . Marital Status:  Married    Spouse Name: N/A  . Number of Children: 2  . Years of Education: N/A   Occupational History  . Not on file.   Social History Main Topics  . Smoking status: Current Every Day Smoker -- 0.50 packs/day for 55 years    Types: Cigarettes  . Smokeless tobacco: Never Used  . Alcohol Use: No  . Drug Use: No  . Sexual Activity: Not on file   Other Topics Concern  . Not on file   Social History Narrative   He is married with 2 children. Does not exercise regularly.   He and his wife have been recently living in [place], [place] (but just moved back to [place], [place] in November).   He still smokes about a half-pack a day and is not interested in quitting. No alcohol or recreational drug use.   Family History  Problem Relation Age of Onset  . Other Mother 62    Abdominal Aortic Aneurysm   . Stroke Mother   . Alzheimer's disease Father 83  . Stroke Father     Wt Readings from Last 3 Encounters:  01/26/15 97.977 kg (216 lb)  01/11/15 97.523 kg (215 lb)  01/03/15 98.431 kg (217 lb)    PHYSICAL EXAM BP 124/78 mmHg  Pulse 83  Ht 6' (1.829 m)  Wt 97.977 kg (216 lb)  BMI 29.29 kg/m2 General appearance: alert, cooperative, appears stated age, no distress and borderline obese. He smells of cigarette smoke and has baseline appearance of barrel chest with accessory muscle use for respirations but no increased worker breathing. Chronically  ill appearing.  Neck: no adenopathy, no carotid bruit and no JVD Lungs: Diffuse coarse rhonchorous/wheezing sounds throughout overall poor air movement, but no respiratory distress. He has diffuse expiratory wheezes. No rales. Heart: RRR with normal S1 and S2 - but Overall distant heart sounds. Intermittent extrasystole. No M/R/G Abdomen: soft, non-tender; bowel sounds normal; no masses,  no organomegaly; no HSM Extremities: No clubbing or cyanosis. Minimal/trace edema  Pulses: 2+ and symmetric radial pulses; bilateral femoral bruits with  diminished pulses on both distal lower extremities. Skin: mobility and turgor normal ;   Changes consistent with PAD with somewhat scaly skin is thin and mildly brawny; both arms still have bruises  Neurologic: Mental status: Alert, oriented, thought content appropriate Cranial nerves: normal (II-XII grossly intact) Psych: Much more stable mood and affect then noted last week. He still remains stubborn but has relatively normal thought content if not poor judgment.   Adult ECG Report actually performed for the July 8 visit.   Rate: 74 ;  Rhythm: normal sinus rhythm and Essentially normal EKG  Narrative Interpretation: Normal EKG with normal axis, intervals and durations. Borderline low voltage.   Other studies Reviewed: Additional studies/ records that were reviewed today include:  Recent Labs: No results found for: CHOL, HDL, LDLCALC, [place] Grater, CHOLHDL    Chemistry      Component Value Date/Time   NA 139 07/07/2014 1105   K 4.3 07/07/2014 1105   CL 103 07/07/2014 1105   CO2 24 07/07/2014 1105   BUN 16 07/07/2014 1105   CREATININE 1.21 07/07/2014 1105      Component Value Date/Time   CALCIUM 9.5 07/07/2014 1105   ALKPHOS 61 05/02/2014 1156   AST 14 05/02/2014 1156   ALT 14 05/02/2014 1156   BILITOT 0.40 05/02/2014 1156      ASSESSMENT / PLAN: Problem List Items Addressed This Visit    Coronary artery disease involving coronary bypass graft of native heart without angina pectoris (Chronic)    Occluded native right and vein graft conduit to the RCA. This correlates with his Myoview. I don't really know when this happened, it may have been [place]. Regardless, he has not had recurrent symptoms. Unfortunately we are limited in our medical management. He is on an ACE inhibitor, but unfortunately not aspirin, statin or beta blocker for reasons noted above. In the past without about Plavix and he was resistant to use Plavix or aspirin.        Essential hypertension (Chronic)    Well-controlled on ACE inhibitor.      Relevant Orders   Lipid panel   Comprehensive metabolic panel   Hyperlipidemia with target LDL less than 70 - Primary (Chronic)    Statin "allergy ". Intolerant to his Zetia. We'll need to check lipids prior to next visit. Consider evaluation for PCSK9 inhibition.      Relevant Orders   Lipid panel   Comprehensive metabolic panel   Left main coronary artery disease (Chronic)    Patent grafts to the LCA system by recent cath      Peripheral arterial occlusive disease (Chronic)    Stable Dopplers with mild claudication symptoms. Not lifestyle limiting. Monitored by vascular surgery      Relevant Orders   Lipid panel   Comprehensive metabolic panel   Preoperative cardiovascular examination    The patient does have CAD status post CABG but with no active anginal or heart failure symptoms. He has PAD but  no documented cerebrovascular disease. No TIA or CVA. Normal renal function, nondiabetic  PREOPERATIVE CARDIAC RISK ASSESSMENT   Revised Cardiac Risk Index:  High Risk Surgery: no; Urologic  Defined as Intraperitoneal, intrathoracic or suprainguinal vascular  Active CAD: no; no angina (recent cath for abnormal myoview revealed occluded SVG-RCA & native RCA, but otherwise patent conduits to the LCA system).  CHF: no; DOE is more related to Significant COPD  Cerebrovascular Disease: no; - no documented Carotid disease  Diabetes: no; On Insulin: no  CKD (Cr >~ 2): no; 1.21 in Dec 2015  Total: 0 Estimated Risk of Adverse (cardiac) Outcome: LOW Estimated Risk of MI, PE, VF/VT (Cardiac Arrest), Complete Heart Block: <1 %   ACC/AHA Guidelines for "Clearance":  Step 1 - Need for Emergency Surgery: No:   If Yes - go straight to OR with perioperative surveillance  Step 2 - Active Cardiac Conditions (Unstable Angina, Decompensated HF, Significant  Arrhytmias - Complete HB, Mobitz II,  Symptomatic VT or SVT, Severe Aortic Stenosis - mean gradient > 40 mmHg, Valve area < 1.0 cm2):   No: see above  If Yes - Evaluate & Treat per ACC/AHA Guidelines  Step 3 -  Low Risk Surgery: Yes  If Yes --> proceed to OR  If No --> Step 4  Step 4 - Functional Capacity >= 4 METS without symptoms: Yes  If Yes --> proceed to OR  If No --> Step 5 Regardless, in the absence of active cardiac symptoms, a nuclear stress test or other evaluation would not modify management.  Recommendation would be to proceed to the OR without any further evaluation. Unfortunately, he is not on a beta blocker because of his COPD. He is not on statin due to intolerance.  His major preoperative risk revolves around his chronic smoking-related COPD/lung issues.      S/P CABG x 4 (Chronic)   Relevant Orders   Lipid panel   Comprehensive metabolic panel   Smoker unmotivated to quit (Chronic)    He is not to try to quit based on prior experience.       Other Visit Diagnoses    Polypharmacy        Relevant Orders    Lipid panel    Comprehensive metabolic panel       Current medicines are reviewed at length with the patient today. (+/- concerns) he still refuses to take statins or other medications for cholesterol. The following changes have been made: None    Catherine Oak, Leonie Green, M.D., M.S. Interventional Cardiologist   Pager # [phone]

## 2015-01-28 NOTE — Assessment & Plan Note (Signed)
Stable Dopplers with mild claudication symptoms. Not lifestyle limiting. Monitored by vascular surgery

## 2015-01-28 NOTE — Assessment & Plan Note (Signed)
The patient does have CAD status post CABG but with no active anginal or heart failure symptoms. He has PAD but no documented cerebrovascular disease. No TIA or CVA. Normal renal function, nondiabetic  PREOPERATIVE CARDIAC RISK ASSESSMENT   Revised Cardiac Risk Index:  High Risk Surgery: no; Urologic  Defined as Intraperitoneal, intrathoracic or suprainguinal vascular  Active CAD: no; no angina (recent cath for abnormal myoview revealed occluded SVG-RCA & native RCA, but otherwise patent conduits to the LCA system).  CHF: no; DOE is more related to Significant COPD  Cerebrovascular Disease: no; - no documented Carotid disease  Diabetes: no; On Insulin: no  CKD (Cr >~ 2): no; 1.21 in Dec 2015  Total: 0 Estimated Risk of Adverse (cardiac) Outcome: LOW Estimated Risk of MI, PE, VF/VT (Cardiac Arrest), Complete Heart Block: <1 %   ACC/AHA Guidelines for "Clearance":  Step 1 - Need for Emergency Surgery: No:   If Yes - go straight to OR with perioperative surveillance  Step 2 - Active Cardiac Conditions (Unstable Angina, Decompensated HF, Significant  Arrhytmias - Complete HB, Mobitz II, Symptomatic VT or SVT, Severe Aortic Stenosis - mean gradient > 40 mmHg, Valve area < 1.0 cm2):   No: see above  If Yes - Evaluate & Treat per ACC/AHA Guidelines  Step 3 -  Low Risk Surgery: Yes  If Yes --> proceed to OR  If No --> Step 4  Step 4 - Functional Capacity >= 4 METS without symptoms: Yes  If Yes --> proceed to OR  If No --> Step 5 Regardless, in the absence of active cardiac symptoms, a nuclear stress test or other evaluation would not modify management.  Recommendation would be to proceed to the OR without any further evaluation. Unfortunately, he is not on a beta blocker because of his COPD. He is not on statin due to intolerance.  His major preoperative risk revolves around his chronic smoking-related COPD/lung issues.

## 2015-01-28 NOTE — Assessment & Plan Note (Signed)
Patent grafts to the LCA system by recent cath

## 2015-02-03 ENCOUNTER — Encounter: Payer: Self-pay | Admitting: Cardiovascular Disease

## 2015-02-10 NOTE — Patient Outreach (Signed)
[facility]) Care Management  02/10/2015  MERDITH BOYD [birth year] [id number]   Referral from [facility], assigned Quinn Plowman, RN to outreach.  Ronnell Freshwater. [place], [place] Management [place] Assistant Phone: [phone] Fax: [phone]

## 2015-02-14 ENCOUNTER — Encounter (HOSPITAL_BASED_OUTPATIENT_CLINIC_OR_DEPARTMENT_OTHER): Payer: Self-pay | Admitting: *Deleted

## 2015-02-14 NOTE — Progress Notes (Signed)
SPOKE W/ WIFE.  NPO AFTER MN.  ARRIVE AT 0745.  NEEDS ISTAT.  CURRENT EKG, CHEST CT, AND CARDIAC CLEARANCE IN CHART AND EPIC. WILL TAKE CYMBALTA, VIT. D, AND SYMBICORT / SPIRIVA INHALER'S AM DOS W/ SIPS OF WATER.  FROM PREVIOUS INSTRUCTIONS , WHEN PT WAS SCHEDULED ON 01-13-2015,  USE INCENTIVE SPIROMETER SEVERAL TIMES DAILY UNTIL DOS.

## 2015-02-20 ENCOUNTER — Other Ambulatory Visit: Payer: Self-pay

## 2015-02-20 NOTE — Patient Outreach (Signed)
[facility]) Care Management  02/20/2015  John Ball 26-Dec-1940 [id number]   SUBJECTIVE:  Telephone call to patient regarding high risk referral outreach.  Discussed and offered [facility] care management services to patient.  Patient states, "its hard to say whether or not I need your services,  I am having bladder surgery in the morning."  Patient refused services at this time.  Patient agreed to receive [facility] outreach letter and pamphlet in the event services are needed.    PLAN: RNCM will send patient outreach letter and pamphlet. RNCM will refer patient to [facility] to close due to refusal of services. RNCM will notify patients primary MD of refusal of services.  Quinn Plowman RN,BSN,CCM [place] Coordinator [phone]

## 2015-02-21 ENCOUNTER — Ambulatory Visit (HOSPITAL_BASED_OUTPATIENT_CLINIC_OR_DEPARTMENT_OTHER): Payer: Medicare Other | Admitting: Anesthesiology

## 2015-02-21 ENCOUNTER — Encounter (HOSPITAL_BASED_OUTPATIENT_CLINIC_OR_DEPARTMENT_OTHER): Payer: Self-pay | Admitting: Anesthesiology

## 2015-02-21 ENCOUNTER — Encounter (HOSPITAL_BASED_OUTPATIENT_CLINIC_OR_DEPARTMENT_OTHER)
Admission: RE | Disposition: A | Discharge status: Home / Self Care | Payer: Self-pay | Source: Ambulatory Visit | Attending: Urology

## 2015-02-21 ENCOUNTER — Ambulatory Visit (HOSPITAL_BASED_OUTPATIENT_CLINIC_OR_DEPARTMENT_OTHER)
Admission: RE | Admit: 2015-02-21 | Discharge: 2015-02-21 | Disposition: A | Discharge status: Home / Self Care | Payer: Medicare Other | Source: Ambulatory Visit | Attending: Urology | Admitting: Urology

## 2015-02-21 DIAGNOSIS — F419 Anxiety disorder, unspecified: Secondary | ICD-10-CM | POA: Diagnosis not present

## 2015-02-21 DIAGNOSIS — F329 Major depressive disorder, single episode, unspecified: Secondary | ICD-10-CM | POA: Insufficient documentation

## 2015-02-21 DIAGNOSIS — Z888 Allergy status to other drugs, medicaments and biological substances status: Secondary | ICD-10-CM | POA: Diagnosis not present

## 2015-02-21 DIAGNOSIS — I739 Peripheral vascular disease, unspecified: Secondary | ICD-10-CM | POA: Insufficient documentation

## 2015-02-21 DIAGNOSIS — M1990 Unspecified osteoarthritis, unspecified site: Secondary | ICD-10-CM | POA: Diagnosis not present

## 2015-02-21 DIAGNOSIS — Z951 Presence of aortocoronary bypass graft: Secondary | ICD-10-CM | POA: Insufficient documentation

## 2015-02-21 DIAGNOSIS — G4733 Obstructive sleep apnea (adult) (pediatric): Secondary | ICD-10-CM | POA: Insufficient documentation

## 2015-02-21 DIAGNOSIS — Z85118 Personal history of other malignant neoplasm of bronchus and lung: Secondary | ICD-10-CM | POA: Diagnosis not present

## 2015-02-21 DIAGNOSIS — N3289 Other specified disorders of bladder: Secondary | ICD-10-CM | POA: Diagnosis not present

## 2015-02-21 DIAGNOSIS — G629 Polyneuropathy, unspecified: Secondary | ICD-10-CM | POA: Insufficient documentation

## 2015-02-21 DIAGNOSIS — Z886 Allergy status to analgesic agent status: Secondary | ICD-10-CM | POA: Insufficient documentation

## 2015-02-21 DIAGNOSIS — C672 Malignant neoplasm of lateral wall of bladder: Secondary | ICD-10-CM | POA: Diagnosis not present

## 2015-02-21 DIAGNOSIS — N135 Crossing vessel and stricture of ureter without hydronephrosis: Secondary | ICD-10-CM | POA: Insufficient documentation

## 2015-02-21 DIAGNOSIS — Z885 Allergy status to narcotic agent status: Secondary | ICD-10-CM | POA: Diagnosis not present

## 2015-02-21 DIAGNOSIS — C679 Malignant neoplasm of bladder, unspecified: Secondary | ICD-10-CM | POA: Insufficient documentation

## 2015-02-21 DIAGNOSIS — D494 Neoplasm of unspecified behavior of bladder: Secondary | ICD-10-CM | POA: Diagnosis present

## 2015-02-21 DIAGNOSIS — I509 Heart failure, unspecified: Secondary | ICD-10-CM | POA: Diagnosis not present

## 2015-02-21 DIAGNOSIS — F17210 Nicotine dependence, cigarettes, uncomplicated: Secondary | ICD-10-CM | POA: Insufficient documentation

## 2015-02-21 DIAGNOSIS — I2510 Atherosclerotic heart disease of native coronary artery without angina pectoris: Secondary | ICD-10-CM | POA: Insufficient documentation

## 2015-02-21 DIAGNOSIS — E785 Hyperlipidemia, unspecified: Secondary | ICD-10-CM | POA: Diagnosis not present

## 2015-02-21 DIAGNOSIS — I10 Essential (primary) hypertension: Secondary | ICD-10-CM | POA: Diagnosis not present

## 2015-02-21 DIAGNOSIS — F4310 Post-traumatic stress disorder, unspecified: Secondary | ICD-10-CM | POA: Insufficient documentation

## 2015-02-21 DIAGNOSIS — Z86718 Personal history of other venous thrombosis and embolism: Secondary | ICD-10-CM | POA: Diagnosis not present

## 2015-02-21 DIAGNOSIS — J449 Chronic obstructive pulmonary disease, unspecified: Secondary | ICD-10-CM | POA: Diagnosis not present

## 2015-02-21 HISTORY — PX: CYSTOSCOPY W/ URETERAL STENT PLACEMENT: SHX1429

## 2015-02-21 HISTORY — PX: TRANSURETHRAL RESECTION OF BLADDER TUMOR: SHX2575

## 2015-02-21 LAB — POCT I-STAT 4, (NA,K, GLUC, HGB,HCT)
GLUCOSE: 103 mg/dL — AB (ref 65–99)
HEMATOCRIT: 48 % (ref 39.0–52.0)
HEMOGLOBIN: 16.3 g/dL (ref 13.0–17.0)
POTASSIUM: 4.5 mmol/L (ref 3.5–5.1)
SODIUM: 141 mmol/L (ref 135–145)

## 2015-02-21 SURGERY — TURBT (TRANSURETHRAL RESECTION OF BLADDER TUMOR)
Anesthesia: Spinal | Site: Ureter

## 2015-02-21 MED ORDER — MIDAZOLAM HCL 5 MG/5ML IJ SOLN
INTRAMUSCULAR | Status: DC | PRN
  Administered 2015-02-21 (×2): 1 mg via INTRAVENOUS

## 2015-02-21 MED ORDER — FENTANYL CITRATE (PF) 100 MCG/2ML IJ SOLN
INTRAMUSCULAR | Status: AC
  Filled 2015-02-21: qty 4

## 2015-02-21 MED ORDER — FENTANYL CITRATE (PF) 100 MCG/2ML IJ SOLN
25.0000 ug | INTRAMUSCULAR | Status: DC | PRN
  Filled 2015-02-21: qty 1

## 2015-02-21 MED ORDER — MIDAZOLAM HCL 2 MG/2ML IJ SOLN
INTRAMUSCULAR | Status: AC
  Filled 2015-02-21: qty 2

## 2015-02-21 MED ORDER — CEFAZOLIN SODIUM 1-5 GM-% IV SOLN
1.0000 g | INTRAVENOUS | Status: DC
  Filled 2015-02-21: qty 50

## 2015-02-21 MED ORDER — DIAZEPAM 5 MG PO TABS
2.5000 mg | ORAL_TABLET | Freq: Once | ORAL | Status: AC
  Administered 2015-02-21: 2.5 mg via ORAL
  Filled 2015-02-21: qty 1

## 2015-02-21 MED ORDER — KETAMINE HCL 10 MG/ML IJ SOLN
INTRAMUSCULAR | Status: AC
  Filled 2015-02-21: qty 1

## 2015-02-21 MED ORDER — DIAZEPAM 5 MG PO TABS
ORAL_TABLET | ORAL | Status: AC
  Filled 2015-02-21: qty 1

## 2015-02-21 MED ORDER — BUPIVACAINE IN DEXTROSE 0.75-8.25 % IT SOLN
INTRATHECAL | Status: DC | PRN
  Administered 2015-02-21: 1.5 mL via INTRATHECAL

## 2015-02-21 MED ORDER — PROPOFOL 10 MG/ML IV BOLUS
INTRAVENOUS | Status: DC | PRN
  Administered 2015-02-21: 20 mg via INTRAVENOUS

## 2015-02-21 MED ORDER — BELLADONNA ALKALOIDS-OPIUM 16.2-60 MG RE SUPP
RECTAL | Status: AC
  Filled 2015-02-21: qty 1

## 2015-02-21 MED ORDER — IOHEXOL 350 MG/ML SOLN
INTRAVENOUS | Status: DC | PRN
  Administered 2015-02-21: 20 mL

## 2015-02-21 MED ORDER — FENTANYL CITRATE (PF) 100 MCG/2ML IJ SOLN
INTRAMUSCULAR | Status: DC | PRN
  Administered 2015-02-21: 50 ug via INTRAVENOUS

## 2015-02-21 MED ORDER — SODIUM CHLORIDE 0.9 % IV SOLN
250.0000 mg | INTRAVENOUS | Status: DC | PRN
  Administered 2015-02-21: 5 ug/kg/min via INTRAVENOUS

## 2015-02-21 MED ORDER — CEFAZOLIN SODIUM-DEXTROSE 2-3 GM-% IV SOLR
2.0000 g | INTRAVENOUS | Status: AC
  Administered 2015-02-21: 2 g via INTRAVENOUS
  Filled 2015-02-21: qty 50

## 2015-02-21 MED ORDER — ONDANSETRON HCL 4 MG/2ML IJ SOLN
INTRAMUSCULAR | Status: DC | PRN
  Administered 2015-02-21: 4 mg via INTRAVENOUS

## 2015-02-21 MED ORDER — PROPOFOL 500 MG/50ML IV EMUL
INTRAVENOUS | Status: DC | PRN
  Administered 2015-02-21: 75 ug/kg/min via INTRAVENOUS

## 2015-02-21 MED ORDER — PHENYLEPHRINE HCL 10 MG/ML IJ SOLN
10.0000 mg | INTRAVENOUS | Status: DC | PRN
  Administered 2015-02-21: 50 ug/min via INTRAVENOUS

## 2015-02-21 MED ORDER — LIDOCAINE HCL (CARDIAC) 20 MG/ML IV SOLN
INTRAVENOUS | Status: DC | PRN
  Administered 2015-02-21: 80 mg via INTRAVENOUS

## 2015-02-21 MED ORDER — SODIUM CHLORIDE 0.9 % IR SOLN
Status: DC | PRN
  Administered 2015-02-21: 12000 mL

## 2015-02-21 MED ORDER — MITOMYCIN CHEMO FOR BLADDER INSTILLATION 40 MG
40.0000 mg | Freq: Once | INTRAVENOUS | Status: AC
  Administered 2015-02-21: 40 mg via INTRAVESICAL
  Filled 2015-02-21: qty 40

## 2015-02-21 MED ORDER — PROMETHAZINE HCL 25 MG/ML IJ SOLN
6.2500 mg | INTRAMUSCULAR | Status: DC | PRN
  Filled 2015-02-21: qty 1

## 2015-02-21 MED ORDER — CEFAZOLIN SODIUM-DEXTROSE 2-3 GM-% IV SOLR
INTRAVENOUS | Status: AC
  Filled 2015-02-21: qty 50

## 2015-02-21 MED ORDER — LACTATED RINGERS IV SOLN
INTRAVENOUS | Status: DC
  Administered 2015-02-21 (×2): via INTRAVENOUS
  Filled 2015-02-21: qty 1000

## 2015-02-21 MED ORDER — LIDOCAINE HCL 2 % EX GEL
CUTANEOUS | Status: DC | PRN
  Administered 2015-02-21: 1

## 2015-02-21 MED ORDER — LACTATED RINGERS IV SOLN
INTRAVENOUS | Status: DC
  Administered 2015-02-21: 09:00:00 via INTRAVENOUS
  Filled 2015-02-21: qty 1000

## 2015-02-21 SURGICAL SUPPLY — 46 items
ADAPTER CATH URET PLST 4-6FR (CATHETERS) IMPLANT
ADPR CATH URET STRL DISP 4-6FR (CATHETERS)
APL SKNCLS STERI-STRIP NONHPOA (GAUZE/BANDAGES/DRESSINGS)
BAG DRAIN URO-CYSTO SKYTR STRL (DRAIN) ×4 IMPLANT
BAG DRN ANRFLXCHMBR STRAP LEK (BAG)
BAG DRN UROCATH (DRAIN) ×2
BAG URINE DRAINAGE (UROLOGICAL SUPPLIES) ×4 IMPLANT
BAG URINE LEG 19OZ MD ST LTX (BAG) IMPLANT
BENZOIN TINCTURE PRP APPL 2/3 (GAUZE/BANDAGES/DRESSINGS) IMPLANT
CANISTER SUCT LVC 12 LTR MEDI- (MISCELLANEOUS) IMPLANT
CATH FOLEY 2WAY SLVR  5CC 20FR (CATHETERS)
CATH FOLEY 2WAY SLVR  5CC 22FR (CATHETERS)
CATH FOLEY 2WAY SLVR 5CC 20FR (CATHETERS) IMPLANT
CATH FOLEY 2WAY SLVR 5CC 22FR (CATHETERS) IMPLANT
CATH INTERMIT  6FR 70CM (CATHETERS) IMPLANT
CATH URET 5FR 28IN CONE TIP (BALLOONS) ×2
CATH URET 5FR 28IN OPEN ENDED (CATHETERS) IMPLANT
CATH URET 5FR 70CM CONE TIP (BALLOONS) ×2 IMPLANT
CLOTH BEACON ORANGE TIMEOUT ST (SAFETY) ×4 IMPLANT
DRSG TELFA 3X8 NADH (GAUZE/BANDAGES/DRESSINGS) IMPLANT
ELECT BUTTON BIOP 24F 90D PLAS (MISCELLANEOUS) IMPLANT
ELECT REM PT RETURN 9FT ADLT (ELECTROSURGICAL)
ELECTRODE REM PT RTRN 9FT ADLT (ELECTROSURGICAL) IMPLANT
EVACUATOR MICROVAS BLADDER (UROLOGICAL SUPPLIES) IMPLANT
GLOVE BIO SURGEON STRL SZ 6.5 (GLOVE) ×3 IMPLANT
GLOVE BIO SURGEON STRL SZ7.5 (GLOVE) ×4 IMPLANT
GLOVE BIO SURGEONS STRL SZ 6.5 (GLOVE) ×1
GLOVE BIOGEL PI IND STRL 6.5 (GLOVE) ×4 IMPLANT
GLOVE BIOGEL PI INDICATOR 6.5 (GLOVE) ×4
GOWN STRL REUS W/ TWL XL LVL3 (GOWN DISPOSABLE) ×2 IMPLANT
GOWN STRL REUS W/TWL XL LVL3 (GOWN DISPOSABLE) ×8 IMPLANT
GUIDEWIRE 0.038 PTFE COATED (WIRE) IMPLANT
GUIDEWIRE ANG ZIPWIRE 038X150 (WIRE) IMPLANT
GUIDEWIRE STR DUAL SENSOR (WIRE) IMPLANT
HOLDER FOLEY CATH W/STRAP (MISCELLANEOUS) IMPLANT
IV NS IRRIG 3000ML ARTHROMATIC (IV SOLUTION) ×16 IMPLANT
KIT BALLIN UROMAX 15FX10 (LABEL) IMPLANT
KIT BALLN UROMAX 15FX4 (MISCELLANEOUS) IMPLANT
KIT BALLN UROMAX 26 75X4 (MISCELLANEOUS)
LOOP CUT BIPOLAR 24F LRG (ELECTROSURGICAL) ×4 IMPLANT
MANIFOLD NEPTUNE II (INSTRUMENTS) ×4 IMPLANT
NS IRRIG 500ML POUR BTL (IV SOLUTION) IMPLANT
PACK CYSTO (CUSTOM PROCEDURE TRAY) ×4 IMPLANT
PLUG CATH AND CAP STER (CATHETERS) IMPLANT
SET ASPIRATION TUBING (TUBING) IMPLANT
SET HIGH PRES BAL DIL (LABEL)

## 2015-02-21 NOTE — Progress Notes (Signed)
Unable to void, bladder scan greater than 824, complaining of being uncomfortable and getting anxious, see orders.

## 2015-02-21 NOTE — Op Note (Signed)
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 He presents today for TURBT with possible mitomycin-C. Also bilateral retrograde pyelograms to complete the evaluation.  Findings:   On cystoscopy the fossa navicularis was quite tight and required dilation to 28 [place] to allow passage of the cystoscope and the resectoscope sheath. The remainder of the urethra appeared normal. The prostatic urethra appeared normal. The trigone and ureteral orifices were in their normal orthotopic position with clear efflux. There was a bladder tumor a few centimeters lateral to the right ureteral orifice which appeared papillary in nature. The remainder of the bladder mucosa was normal. There were no stones or foreign bodies in the bladder.   Right retrograde pyelogram-this outlined a single ureter single collecting system unit without filling defect, stricture or dilation. There was brisk drainage.  Left retrograde pyelogram-this outlined a single ureter single collecting system unit without filling defect, stricture or dilation. There was brisk drainage.  On exam under anesthesia the penis was circumcised without mass or lesion. The testicles were descended bilaterally and palpably normal. On digital rectal exam the prostate was mildly enlarged but smooth without hard area or nodule.  Description of procedure: After consent was obtained patient brought to the operating room. After adequate anesthesia he is placed in lithotomy position and prepped and draped in the usual sterile fashion. A timeout was performed confirm the patient and  procedure. The cystoscope was passed per urethra and the bladder inspected with the 30 and 70 lens. The bladder was flushed repeatedly and and a cone-tipped catheter was used to cannulate the right ureteral orifice and retrograde injection of contrast was performed utilizing scout and spot imaging. In a similar manner the left ureteral orifice was cannulated and left retrograde injection of contrast was performed.  The cystoscope was removed. An exam under anesthesia was performed. I remove my lead and changed gloves and gowns.  The resectoscope was passed per urethra after the fossa navicularis was dilated to 28 [place]. The loop was then used to resect the tumor down to the base. This was sent as bladder tumor. At the base a was a little thicker and the cut setting was not cutting the tissue was just continuing to cauterize. Therefore I inserted the cold cup biopsy forceps and guided good bite at the base which appeared to have some muscle. This was sent separately as bladder tumor base. Entire area was fulgurated there was excellent hemostasis and no concern for perforation. The bladder was filled and the scope removed. An 102 French Foley catheter was placed which was draining clear urine.  The patient was awakened taken to recovery room in stable condition.  Installation of mitomycin C: In the recovery room mitomycin-C 40 mg/40 mL of sterile irrigation was instilled per the Foley into the bladder and left indwelling for one hour. The bladder was then drained.  Complications: None  Blood loss: Minimal  Specimens to pathology: #1 bladder tumor chips #2 bladder tumor base biopsy  Drains: 18 French Foley catheter which will be removed in recovery.  Disposition: Patient stable to PACU

## 2015-02-21 NOTE — Transfer of Care (Signed)
Immediate Anesthesia Transfer of Care Note  Patient: John Ball  Procedure(s) Performed: Procedure(s): TRANSURETHRAL RESECTION OF BLADDER TUMOR (TURBT) (N/A) BILATERAL RETROGRADE PYELOGRAM (N/A)  Patient Location: PACU  Anesthesia Type:Spinal and MAC combined with regional for post-op pain  Level of Consciousness: awake, alert , oriented and patient cooperative  Airway & Oxygen Therapy: Patient Spontanous Breathing and Patient connected to nasal cannula oxygen  Post-op Assessment: Report given to RN and Post -op Vital signs reviewed and stable  Post vital signs: Reviewed and stable  Last Vitals:  Filed Vitals:   02/21/15 1018  BP: 102/66  Pulse: 61  Temp: 36.4 C  Resp: 16    Complications: No apparent anesthesia complications

## 2015-02-21 NOTE — H&P (Signed)
H&P  Chief Complaint: bladder tumor   History of Present Illness: Pt presents for TURBT of bladder tumor and bilateral retrograde pyelogram to complete surveillance of upper tracts. Bladder tumor was noted on CT scan for surveillance of lung cancer at the [place]. He's been well without dysuria or gross hematuria. He saw Dr. Ellyn Hack for preop clearance from cardiology point of view. Appreciate his input.   Past Medical History  Diagnosis Date  . 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 Essentially normal echo  . 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 Diaphragmatic attenuation  . Essential hypertension   . Hyperlipidemia with target LDL less than 70   . Smoker unmotivated to quit     Reportedly he came close to having a nervous breakdown when he last tried to quit  . History of DVT of lower extremity   . Depression   . CAD (coronary artery disease)     cardiologist-  dr kelly/ dr berry  ----  s/p  cabg x4 in 2004  w/ severe LM disease  . COPD, severe   . Arthritis   . Peripheral arterial occlusive disease followed by dr Early--  per last dopplers 06-28-2014 no change right graft,  50-74% stenosis common and left mid superficial femoral artery    2011   a) Gore-Tex graft right AK popA-BK popA --> b) 2012: thrombosed graft --> thrombectomy with dacron patch = 1 V runnof via Peroneal; c) 01/2014: R femoropopliteal bypass with left femoral vein  . 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 History of arterial bypass of lower extremity     RIGHT FEM-POP  . Dyspnea on exertion     CHRONIC  . Recurrent productive cough     SMOKER'S COUGH  . PTSD (post-traumatic stress disorder)     anxiety attack's---  [place]  . History of panic attacks   . OSA (obstructive sleep apnea)     uses O2  via [place]  --  intolerate to CPAP  . Neuropathy, peripheral   . Nocturia   . History of CHF (congestive heart failure)   . Wears glasses   . Hearing loss of both ears     does wear his hearing aids  . No natural teeth     does not wear his dentures  . Complication of anesthesia     hx prolonged post op oxygen dependent/  hx hallucinations for 2 day post op lobectomy 2012  . Anxiety    Past Surgical History  Procedure Laterality Date  . Femoral-tibial bypass graft Right 01/10/2014    Procedure: Right Femoral to Posterior Tibial Bypass  Graft using Left Leg Vein, Thrombectomy Right Common Femoral and Profunda of Leg . ;  Surgeon: Rosetta Posner, MD;  Location: [place];  Service: Vascular;  Laterality: Right;  . Left heart catheterization with coronary/graft angiogram N/A 07/12/2014    Procedure: LEFT HEART CATHETERIZATION WITH Beatrix Fetters;  Surgeon: Troy Sine, MD;  Location: [facility] C[facility];  Service: Cardiovascular;  Laterality: N/A;  mild LV dysfunction,  ef 45-50% , mild inferior hypocontractility; sig. native coronary obstuctive disease with left to right predominant septal collateralization;  occluded SVG to RCA graft , other graft's patent  . Coronary artery bypass graft  07-27-2002  dr Malvin Johns, freeRIMA-RI,SVG-OM2, SVG-rPDA  . Cardiovascular stress test  last one  06-17-2014   dr  Ellyn Hack    Carlton Adam study with no exercise; Intermediate  Risk Study;   moderate size and intensity, partially reversible inferior septal defect consistent with prior infarct and mild to moderate peri-infarct ischemia;  mild hypokinesis of the inferior septal wall,  normal LV function, ef 56%  . Tonsillectomy    . Femoral artery - popliteal artery bypass graft Right 10-24-2008   dr early    w/ GoreTex graft  . Right above knee popliteal graft to below knee popliteal artery bypass with reverse saphenous vein  03-02-2010  . Thrombectomy of right femoral to above knee popliteal goretex graft angioplasty of goretex and saphenous vein junction and above knee popliteal artery   10-06-2010   DR EARLY  . Video assisted thoracoscopy (vats)/wedge resection Right 05-01-2011    [facility]    LOWER LOBECTOMY W/  NODE DISSECTION  . Cardiac catheterization  07-13-2002    Ischemia RCA region on Cardiolite// 60-70% ostial left main, 50% midCx, 60-70% mid RI,  70% mRCA, 90% JeNu and crux 75% RCA ,  95% PLA//  severe LM and 3Vessel CAD, perserved LV, ef 60%  . Left ankle surgery  1990's  . Hydrocele excision Left   . Eye surgery Left Nov 2013  . Colonoscopy  2008    WNL    Home Medications:  Prescriptions prior to admission  Medication Sig Dispense Refill Last Dose  . albuterol (PROVENTIL HFA;VENTOLIN HFA) 108 (90 BASE) MCG/ACT inhaler Inhale 1 puff into the lungs every 6 (six) hours as needed for wheezing or shortness of breath.   02/20/2015 at Unknown time  . albuterol (PROVENTIL) (5 MG/ML) 0.5% nebulizer solution Take 2.5 mg by nebulization every 6 (six) hours as needed for wheezing or shortness of breath.   02/21/2015 at 0600  . Cholecalciferol (VITAMIN D3) 1000 UNITS CAPS Take 1 capsule by mouth every morning.   02/20/2015 at Unknown time  . clonazePAM (KLONOPIN) 1 MG tablet Take 1 mg by mouth 2 (two) times daily as needed for anxiety.   02/20/2015 at Unknown time  . DULoxetine (CYMBALTA) 60 MG capsule Take 60 mg by mouth every morning.    02/20/2015 at Unknown time  .  gabapentin (NEURONTIN) 100 MG capsule Take 100 mg by mouth at bedtime.   02/20/2015 at Unknown time  . OXYGEN Inhale into the lungs at bedtime. Uses O2 -- intolerant to CPAP   02/21/2015 at Unknown time  . prazosin (MINIPRESS) 1 MG capsule Take 1 mg by mouth at bedtime.   02/20/2015 at Unknown time  . QUEtiapine (SEROQUEL XR) 300 MG 24 hr tablet Take 300 mg by mouth at bedtime.    02/20/2015 at Unknown time  . ramipril (ALTACE) 10 MG capsule Take 10 mg  by mouth every morning.    02/20/2015 at Unknown time  . tiotropium (SPIRIVA) 18 MCG inhalation capsule Place 18 mcg into inhaler and inhale every morning.    02/20/2015 at Unknown time  . traZODone (DESYREL) 150 MG tablet Take 150 mg by mouth at bedtime.   02/20/2015 at Unknown time  . zolpidem (AMBIEN CR) 12.5 MG CR tablet Take 12.5 mg by mouth at bedtime.     02/20/2015 at Unknown time  . nitroGLYCERIN (NITROSTAT) 0.4 MG SL tablet Place 1 tablet (0.4 mg total) under the tongue every 5 (five) minutes as needed for chest pain. 25 tablet 6 Unknown at Unknown time   Allergies:  Allergies  Allergen Reactions  . Aripiprazole Other (See Comments)    "makes him wild"  . Aspirin Other (See Comments)    Avoids -- bruises real bad  . Crestor [Rosuvastatin Calcium] Other (See Comments)    myalgia  . Effexor [Venlafaxine Hydrochloride] Itching  . Lipitor [Atorvastatin Calcium] Other (See Comments)    myalgia  . Morphine And Related Itching  . Zetia [Ezetimibe] Other (See Comments)    myalgia  . Zocor [Simvastatin - High Dose] Other (See Comments)    myalgia    Family History  Problem Relation Age of Onset  . Other Mother 22    Abdominal Aortic Aneurysm   . Stroke Mother   . Alzheimer's disease Father 78  . Stroke Father    Social History:  reports that he has been smoking Cigarettes.  He has a 27.5 pack-year smoking history. He has never used smokeless tobacco. He reports that he does not drink alcohol or use illicit drugs.  ROS: A complete  review of systems was performed.  All systems are negative except for pertinent findings as noted. ROS   Physical Exam:  Vital signs in last 24 hours: Temp:  [97.6 F (36.4 C)] 97.6 F (36.4 C) (08/16 0751) Pulse Rate:  [81] 81 (08/16 0751) Resp:  [20] 20 (08/16 0751) BP: (135)/(76) 135/76 mmHg (08/16 0751) SpO2:  [99 %] 99 % (08/16 0751) Weight:  [98.431 kg (217 lb)] 98.431 kg (217 lb) (08/16 0751) General:  Alert and oriented, No acute distress HEENT: Normocephalic, atraumatic Cardiovascular: Regular rate and rhythm Lungs: Regular rate and effort Abdomen: Soft, nontender, nondistended, no abdominal masses Back: No CVA tenderness Extremities: No edema Neurologic: Grossly intact  Laboratory Data:  No results found for this or any previous visit (from the past 24 hour(s)). No results found for this or any previous visit (from the past 240 hour(s)). Creatinine: No results for input(s): CREATININE in the last 168 hours.  Istat was stable.   Impression/Assessment/plan: I discussed with the patient and his wife the nature, potential benefits, risks and alternatives to ,TURBT with bilateral retrograde pyelogram, postoperative mitomycin-C installation including side effects of the proposed treatment, the likelihood of the patient achieving the goals of the procedure, and any potential problems that might occur during the procedure or recuperation.  Discussed post-op care and restrictions. Discussed again risk of bladder perforation, bleeding and infection among others. Discussed need for Foley catheter, ureteral stents and staged procedures/restaging.All questions answered. Patient elects to proceed.   John Ball 02/21/2015, 8:45 AM

## 2015-02-21 NOTE — Anesthesia Postprocedure Evaluation (Incomplete)
  Anesthesia Post-op Note  Patient: John Ball  Procedure(s) Performed: Procedure(s): TRANSURETHRAL RESECTION OF BLADDER TUMOR (TURBT) (N/A) BILATERAL RETROGRADE PYELOGRAM (N/A)  Patient Location: {PLACES; ANE POST:19477::"PACU"}  Anesthesia Type:{PROCEDURES; ANE POST ANESTHESIA TYPE:19480}  Level of Consciousness: {FINDINGS; ANE POST LEVEL OF CONSCIOUSNESS:19484}  Airway and Oxygen Therapy: {Exam; oxygen device:30095}  Post-op Pain: {Desc; pain severity:12299}  Post-op Assessment: {ASSESSMENT; ANE POST:19482} LLE Motor Response: Purposeful movement, Responds to commands LLE Sensation: Decreased RLE Motor Response: Purposeful movement, Responds to commands RLE Sensation: Decreased L Sensory Level: L5-Outer lower leg, top of foot, great toe R Sensory Level: L2-Upper inner thigh, upper buttock  Post-op Vital Signs: {DESC; ANE POST MPNTIR:44315}  Last Vitals:  Filed Vitals:   02/21/15 1030  BP: 121/72  Pulse: 59  Temp:   Resp: 10    Complications: {FINDINGS; ANE POST COMPLICATIONS:19485}

## 2015-02-21 NOTE — Anesthesia Procedure Notes (Addendum)
Spinal Patient location during procedure: OR Start time: 02/21/2015 9:10 AM Staffing Anesthesiologist: Franne Grip Performed by: anesthesiologist  Preanesthetic Checklist Completed: patient identified, site marked, surgical consent, pre-op evaluation, timeout performed, IV checked, risks and benefits discussed and monitors and equipment checked Spinal Block Patient position: sitting Prep: Betadine Patient monitoring: heart rate, continuous pulse ox and blood pressure Approach: midline Location: L3-4 Injection technique: single-shot Needle Needle type: Spinocan  Needle gauge: 22 G Needle length: 9 cm Additional Notes Expiration date of kit checked and confirmed. Patient tolerated procedure well, without complications. CSF clear. No paresthesia.    Procedure Name: MAC Date/Time: 02/21/2015 9:10 AM Performed by: Wanita Chamberlain Pre-anesthesia Checklist: Patient identified, Timeout performed, Emergency Drugs available, Suction available and Patient being monitored Patient Re-evaluated:Patient Re-evaluated prior to inductionOxygen Delivery Method: Nasal cannula Intubation Type: IV induction Placement Confirmation: breath sounds checked- equal and bilateral and positive ETCO2

## 2015-02-21 NOTE — Discharge Instructions (Signed)
Cystoscopy, Care After °Refer to this sheet in the next few weeks. These instructions provide you with information on caring for yourself after your procedure. Your caregiver may also give you more specific instructions. Your treatment has been planned according to current medical practices, but problems sometimes occur. Call your caregiver if you have any problems or questions after your procedure. °HOME CARE INSTRUCTIONS  °Things you can do to ease any discomfort after your procedure include: °· Drinking enough water and fluids to keep your urine clear or pale yellow. °· Taking a warm bath to relieve any burning feelings. °SEEK IMMEDIATE MEDICAL CARE IF:  °· You have an increase in blood in your urine. °· You notice blood clots in your urine. °· You have difficulty passing urine. °· You have the chills. °· You have abdominal pain. °· You have a fever or persistent symptoms for more than 2-3 days. °· You have a fever and your symptoms suddenly get worse. °MAKE SURE YOU:  °· Understand these instructions. °· Will watch your condition. °· Will get help right away if you are not doing well or get worse. °Document Released: 01/11/2005 Document Revised: 02/24/2013 Document Reviewed: 12/16/2011 °ExitCare® Patient Information ©2015 [employer]. This information is not intended to replace advice given to you by your health care provider. Make sure you discuss any questions you have with your health care provider. ° °Post Anesthesia Home Care Instructions ° °Activity: °Get plenty of rest for the remainder of the day. A responsible adult should stay with you for 24 hours following the procedure.  °For the next 24 hours, DO NOT: °-Drive a car °-Operate machinery °-Drink alcoholic beverages °-Take any medication unless instructed by your physician °-Make any legal decisions or sign important papers. ° °Meals: °Start with liquid foods such as gelatin or soup. Progress to regular foods as tolerated. Avoid greasy, spicy, heavy  foods. If nausea and/or vomiting occur, drink only clear liquids until the nausea and/or vomiting subsides. Call your physician if vomiting continues. ° °Special Instructions/Symptoms: °Your throat may feel dry or sore from the anesthesia or the breathing tube placed in your throat during surgery. If this causes discomfort, gargle with warm salt water. The discomfort should disappear within 24 hours. ° °If you had a scopolamine patch placed behind your ear for the management of post- operative nausea and/or vomiting: ° °1. The medication in the patch is effective for 72 hours, after which it should be removed.  Wrap patch in a tissue and discard in the trash. Wash hands thoroughly with soap and water. °2. You may remove the patch earlier than 72 hours if you experience unpleasant side effects which may include dry mouth, dizziness or visual disturbances. °3. Avoid touching the patch. Wash your hands with soap and water after contact with the patch. °  ° °

## 2015-02-21 NOTE — Anesthesia Preprocedure Evaluation (Addendum)
Anesthesia Evaluation  Patient identified by MRN, date of birth, ID band Patient awake    Reviewed: Allergy & Precautions, NPO status , Patient's Chart, lab work & pertinent test results  History of Anesthesia Complications (+) history of anesthetic complications  Airway Mallampati: II  TM Distance: >3 FB Neck ROM: Full    Dental no notable dental hx.    Pulmonary shortness of breath, with exertion and Long-Term Oxygen Therapy, sleep apnea and Oxygen sleep apnea , COPD COPD inhaler and oxygen dependent, Current Smoker,  Severe COPD with oxygen dependency. His room air saturation is 98% today. He states that his breathing is better than average today because he is not outside in the yard today. He appears somewhat SOB. He did take his advair today.   + decreased breath sounds+ wheezing      Cardiovascular hypertension, Pt. on medications + CAD and + Peripheral Vascular Disease Normal cardiovascular examRhythm:Regular Rate:Normal     Neuro/Psych PSYCHIATRIC DISORDERS Anxiety Depression  Neuromuscular disease    GI/Hepatic negative GI ROS, Neg liver ROS,   Endo/Other  negative endocrine ROS  Renal/GU negative Renal ROS  negative genitourinary   Musculoskeletal  (+) Arthritis -,   Abdominal   Peds negative pediatric ROS (+)  Hematology negative hematology ROS (+)   Anesthesia Other Findings   Reproductive/Obstetrics negative OB ROS                          Anesthesia Physical Anesthesia Plan  ASA: IV  Anesthesia Plan: Spinal   Post-op Pain Management:    Induction: Intravenous  Airway Management Planned:   Additional Equipment:   Intra-op Plan:   Post-operative Plan: Extubation in OR  Informed Consent: I have reviewed the patients History and Physical, chart, labs and discussed the procedure including the risks, benefits and alternatives for the proposed anesthesia with the patient or  authorized representative who has indicated his/her understanding and acceptance.   Dental advisory given  Plan Discussed with: CRNA  Anesthesia Plan Comments: (Discussed risks and benefits of and differences between spinal and general. Discussed risks of spinal including headache, backache, failure, bleeding and hematoma, infection, and nerve damage and paralysis. Patient consents to spinal. Questions answered. Coagulation studies and platelet count acceptable.   Spinal may be advantageous for him due to his severe COPD. He is on no anticoagulants. His last coag studies were normal.  Spinal lidocaine is on backorder and is unavailable. Plan bupivacaine spinal.)      Anesthesia Quick Evaluation

## 2015-02-22 ENCOUNTER — Encounter (HOSPITAL_BASED_OUTPATIENT_CLINIC_OR_DEPARTMENT_OTHER): Payer: Self-pay | Admitting: Urology

## 2015-02-22 DIAGNOSIS — C672 Malignant neoplasm of lateral wall of bladder: Secondary | ICD-10-CM | POA: Diagnosis not present

## 2015-02-22 DIAGNOSIS — R338 Other retention of urine: Secondary | ICD-10-CM | POA: Diagnosis not present

## 2015-02-22 NOTE — Anesthesia Postprocedure Evaluation (Signed)
  Anesthesia Post-op Note  Patient: John Ball  Procedure(s) Performed: Procedure(s) (LRB): TRANSURETHRAL RESECTION OF BLADDER TUMOR (TURBT) (N/A) BILATERAL RETROGRADE PYELOGRAM (N/A)  Patient Location: PACU  Anesthesia Type: Spinal  Level of Consciousness: awake and alert   Airway and Oxygen Therapy: Patient Spontanous Breathing  Post-op Pain: mild  Post-op Assessment: Post-op Vital signs reviewed, Patient's Cardiovascular Status Stable, Respiratory Function Stable, Patent Airway and No signs of Nausea or vomiting  Last Vitals:  Filed Vitals:   02/21/15 1610  BP: 159/71  Pulse: 64  Temp: 37.1 C  Resp: 20    Post-op Vital Signs: stable   Complications: No apparent anesthesia complications

## 2015-02-24 NOTE — Patient Outreach (Signed)
[facility]) Care Management  02/20/2015  John Ball 04/07/1941 [id number]   Notification from Quinn Plowman, RN to close case due to patient refused [place] Management services.  Thanks, Ronnell Freshwater. [place], [facility] Assistant Phone: [phone] Fax: [phone]

## 2015-02-28 DIAGNOSIS — C672 Malignant neoplasm of lateral wall of bladder: Secondary | ICD-10-CM | POA: Diagnosis not present

## 2015-04-20 ENCOUNTER — Ambulatory Visit (HOSPITAL_COMMUNITY)
Admission: RE | Admit: 2015-04-20 | Discharge: 2015-04-20 | Disposition: A | Discharge status: Home / Self Care | Payer: Medicare Other | Source: Ambulatory Visit | Attending: Internal Medicine | Admitting: Internal Medicine

## 2015-04-20 DIAGNOSIS — C3490 Malignant neoplasm of unspecified part of unspecified bronchus or lung: Secondary | ICD-10-CM | POA: Diagnosis not present

## 2015-04-20 DIAGNOSIS — R590 Localized enlarged lymph nodes: Secondary | ICD-10-CM | POA: Diagnosis not present

## 2015-04-20 DIAGNOSIS — J432 Centrilobular emphysema: Secondary | ICD-10-CM | POA: Insufficient documentation

## 2015-04-20 DIAGNOSIS — Z902 Acquired absence of lung [part of]: Secondary | ICD-10-CM | POA: Diagnosis not present

## 2015-04-20 DIAGNOSIS — C3411 Malignant neoplasm of upper lobe, right bronchus or lung: Secondary | ICD-10-CM | POA: Diagnosis not present

## 2015-05-02 DIAGNOSIS — I739 Peripheral vascular disease, unspecified: Secondary | ICD-10-CM | POA: Diagnosis not present

## 2015-05-02 DIAGNOSIS — J449 Chronic obstructive pulmonary disease, unspecified: Secondary | ICD-10-CM | POA: Diagnosis not present

## 2015-05-02 DIAGNOSIS — G894 Chronic pain syndrome: Secondary | ICD-10-CM | POA: Diagnosis not present

## 2015-05-02 DIAGNOSIS — G4700 Insomnia, unspecified: Secondary | ICD-10-CM | POA: Diagnosis not present

## 2015-05-02 DIAGNOSIS — I10 Essential (primary) hypertension: Secondary | ICD-10-CM | POA: Diagnosis not present

## 2015-05-02 DIAGNOSIS — I2510 Atherosclerotic heart disease of native coronary artery without angina pectoris: Secondary | ICD-10-CM | POA: Diagnosis not present

## 2015-06-20 ENCOUNTER — Telehealth: Payer: Self-pay | Admitting: *Deleted

## 2015-06-20 DIAGNOSIS — I779 Disorder of arteries and arterioles, unspecified: Secondary | ICD-10-CM

## 2015-06-20 DIAGNOSIS — Z951 Presence of aortocoronary bypass graft: Secondary | ICD-10-CM

## 2015-06-20 DIAGNOSIS — Z79899 Other long term (current) drug therapy: Secondary | ICD-10-CM

## 2015-06-20 DIAGNOSIS — E785 Hyperlipidemia, unspecified: Secondary | ICD-10-CM

## 2015-06-20 DIAGNOSIS — I10 Essential (primary) hypertension: Secondary | ICD-10-CM

## 2015-06-20 NOTE — Telephone Encounter (Signed)
Mailed letter and lab slip  Lipid, cmp 07-29-15

## 2015-06-20 NOTE — Telephone Encounter (Signed)
-----   Message from Raiford Simmonds, RN sent at 01/26/2015  5:19 PM EDT ----- Labs cmp ,lipid  Mailed  Due before 07/28/14

## 2015-06-23 ENCOUNTER — Encounter: Payer: Self-pay | Admitting: Family

## 2015-06-30 ENCOUNTER — Ambulatory Visit (HOSPITAL_COMMUNITY)
Admission: RE | Admit: 2015-06-30 | Discharge: 2015-06-30 | Disposition: A | Discharge status: Home / Self Care | Payer: Medicare Other | Source: Ambulatory Visit | Attending: Family | Admitting: Family

## 2015-06-30 ENCOUNTER — Encounter: Payer: Self-pay | Admitting: Family

## 2015-06-30 ENCOUNTER — Ambulatory Visit (INDEPENDENT_AMBULATORY_CARE_PROVIDER_SITE_OTHER): Payer: Medicare Other | Admitting: Family

## 2015-06-30 ENCOUNTER — Ambulatory Visit (INDEPENDENT_AMBULATORY_CARE_PROVIDER_SITE_OTHER)
Admission: RE | Admit: 2015-06-30 | Discharge: 2015-06-30 | Disposition: A | Discharge status: Home / Self Care | Payer: Medicare Other | Source: Ambulatory Visit | Attending: Family | Admitting: Family

## 2015-06-30 VITALS — BP 135/73 | HR 75 | Ht 72.0 in | Wt 218.0 lb

## 2015-06-30 DIAGNOSIS — Z720 Tobacco use: Secondary | ICD-10-CM | POA: Diagnosis not present

## 2015-06-30 DIAGNOSIS — I10 Essential (primary) hypertension: Secondary | ICD-10-CM | POA: Diagnosis not present

## 2015-06-30 DIAGNOSIS — Z48812 Encounter for surgical aftercare following surgery on the circulatory system: Secondary | ICD-10-CM

## 2015-06-30 DIAGNOSIS — Z4889 Encounter for other specified surgical aftercare: Secondary | ICD-10-CM

## 2015-06-30 DIAGNOSIS — F17200 Nicotine dependence, unspecified, uncomplicated: Secondary | ICD-10-CM

## 2015-06-30 DIAGNOSIS — I498 Other specified cardiac arrhythmias: Secondary | ICD-10-CM | POA: Diagnosis not present

## 2015-06-30 DIAGNOSIS — I70201 Unspecified atherosclerosis of native arteries of extremities, right leg: Secondary | ICD-10-CM | POA: Diagnosis not present

## 2015-06-30 DIAGNOSIS — E785 Hyperlipidemia, unspecified: Secondary | ICD-10-CM | POA: Insufficient documentation

## 2015-06-30 DIAGNOSIS — R0989 Other specified symptoms and signs involving the circulatory and respiratory systems: Secondary | ICD-10-CM

## 2015-06-30 DIAGNOSIS — I779 Disorder of arteries and arterioles, unspecified: Secondary | ICD-10-CM

## 2015-06-30 DIAGNOSIS — Z95828 Presence of other vascular implants and grafts: Secondary | ICD-10-CM | POA: Diagnosis not present

## 2015-06-30 NOTE — Progress Notes (Signed)
[facility] [place] HISTORY AND PHYSICAL -PAD  History of Present Illness John Ball is a 74 y.o. male patient of Dr. Donnetta Hutching who is status post extensive prior femoropopliteal bypasses on his right leg. He had initially had a right femoral to above-knee popliteal bypass in April 2010. This was followed by right above-knee to below-knee popliteal bypass in August of 2011. He presented with occlusion of this and underwent thrombectomy and revision of his femoral-popliteal bypass in April of 2012.  He is also s/p right common femoral and profundus femoris artery thrombectomy and endarterectomy; right femoral to posterior tibial bypass with saphenous vein harvested from left leg on 01/10/14. He has a history of DVT. He returns today for routine follow up. He walks about 30 minutes daily and no longer has claudication symptoms with walking, denies non healing wounds in feet or lower legs. His walking is limited by his dyspnea.  He denies any history of stroke or TIA, denies history of MI, but did have a CABG.  He has a history of lung cancer with right lobectomy.  The patient reports New Medical or Surgical History: he had bladder lesion removed in the Summer of 2016, states not cancer  Pt Diabetic: No Pt smoker: smoker (less than 1 ppd, started at age 53 yrs)  Pt meds include: Statin :No, statins caused ithching ASA: No, record indicates that ASA caused GI bleed, but wife states this is not the case, that he bruised easily taking 81 mg ASA daily Other anticoagulants/antiplatelets: no   Past Medical History  Diagnosis Date  . 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 Essentially normal echo  . S/P CABG x 11 Jul 2002    a) LIMA-LAD, freeRIMA-RI, SVG-OM2, SVG-RPDA ; b) MYOVIEW  02/2010: EF 51% no ischemia or infarction. Diaphragmatic attenuation  . Essential hypertension   . Hyperlipidemia with target LDL less than 70   . Smoker unmotivated to quit     Reportedly he came close to having a nervous breakdown when he last tried to quit  . History of DVT of lower extremity   . Depression   . CAD (coronary artery disease)     cardiologist-  dr kelly/ dr berry  ----  s/p  cabg x4 in 2004  w/ severe LM disease  . COPD, severe ([place])   . Arthritis   . Peripheral arterial occlusive disease ([place]) followed by dr Early--  per last dopplers 06-28-2014 no change right graft,  50-74% stenosis common and left mid superficial femoral artery    2011   a) Gore-Tex graft right AK popA-BK popA --> b) 2012: thrombosed graft --> thrombectomy with dacron patch = 1 V runnof via Peroneal; c) 01/2014: R femoropopliteal bypass with left femoral vein  . 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 History of arterial bypass of lower extremity     RIGHT FEM-POP  . Dyspnea on exertion     CHRONIC  . Recurrent productive cough     SMOKER'S COUGH  . PTSD (post-traumatic stress disorder)     anxiety attack's---  [place] V[place]  . History of panic attacks   . OSA (obstructive sleep apnea)     uses O2  via [place]  --  intolerate to CPAP  . Neuropathy, peripheral ([place])   . Nocturia   . History of CHF (congestive heart failure)   . Wears glasses   . Hearing loss of both ears     does wear his hearing aids  . No natural teeth     does not  wear his dentures  . Complication of anesthesia     hx prolonged post op oxygen dependent/  hx hallucinations for 2 day post op lobectomy 2012  . Anxiety     Social History Social History  Substance Use Topics  . Smoking status: Current Every Day Smoker -- 0.50 packs/day for 55 years    Types: Cigarettes  . Smokeless tobacco: Never Used  . Alcohol Use: No    Family History Family History  Problem Relation Age of Onset  . Other Mother 43    Abdominal Aortic Aneurysm   . Stroke Mother   . Alzheimer's disease Father 19  . Stroke Father     Past Surgical History  Procedure Laterality Date  . Femoral-tibial bypass graft Right 01/10/2014    Procedure: Right Femoral to Posterior Tibial Bypass Graft using Left Leg Vein, Thrombectomy Right Common Femoral and Profunda of Leg . ;  Surgeon: Rosetta Posner, MD;  Location: [facility];  Service: Vascular;  Laterality: Right;  . Left heart catheterization with coronary/graft angiogram N/A 07/12/2014    Procedure: LEFT HEART CATHETERIZATION WITH Beatrix Fetters;  Surgeon: Troy Sine, MD;  Location: [facility] CATH LAB;  Service: Cardiovascular;  Laterality: N/A;  mild LV dysfunction,  ef 45-50% , mild inferior hypocontractility; sig. native coronary obstuctive disease with left to right predominant septal collateralization;  occluded SVG to RCA graft , other graft's patent  . Coronary artery bypass graft  07-27-2002  dr Malvin Johns, freeRIMA-RI,SVG-OM2, SVG-rPDA  . Cardiovascular stress test  last one  06-17-2014   dr  Ellyn Hack    Carlton Adam study with no exercise; Intermediate Risk Study;   moderate size and intensity, partially reversible inferior septal defect consistent with prior infarct and mild to moderate peri-infarct ischemia;  mild hypokinesis of the inferior septal wall,  normal LV function, ef 56%  . Tonsillectomy    . Femoral artery - popliteal artery bypass graft Right 10-24-2008   dr early    w/ GoreTex graft  . Right above  knee popliteal graft to below knee popliteal artery bypass with reverse saphenous vein  03-02-2010  . Thrombectomy of right femoral to above knee popliteal goretex graft angioplasty of goretex and saphenous vein junction and above knee popliteal artery   10-06-2010   DR EARLY  . Video assisted thoracoscopy (vats)/wedge resection Right 05-01-2011    FORSYTH    LOWER LOBECTOMY W/  NODE DISSECTION  . Cardiac catheterization  07-13-2002    Ischemia RCA region on Cardiolite// 60-70% ostial left main, 50% midCx, 60-70% mid RI,  70% mRCA, 90% JeNu and crux 75% RCA ,  95% PLA//  severe LM and 3Vessel CAD, perserved LV, ef 60%  . Left ankle surgery  1990's  . Hydrocele excision Left   . Eye surgery Left Nov 2013  . Colonoscopy  2008    WNL  . Transurethral resection of bladder tumor N/A 02/21/2015    Procedure: TRANSURETHRAL RESECTION OF BLADDER TUMOR (TURBT);  Surgeon: Festus Aloe, MD;  Location: [facility];  Service: Urology;  Laterality: N/A;  . Cystoscopy w/ ureteral stent placement N/A 02/21/2015    Procedure: BILATERAL RETROGRADE PYELOGRAM;  Surgeon: Festus Aloe, MD;  Location: [facility];  Service: Urology;  Laterality: N/A;    Allergies  Allergen Reactions  . Aripiprazole Other (See Comments)    "makes him wild"  . Aspirin Other (See Comments)    Avoids -- bruises real bad  . Crestor [Rosuvastatin Calcium] Other (See Comments)    myalgia  . Effexor [Venlafaxine Hydrochloride] Itching  . Lipitor [Atorvastatin Calcium] Other (See Comments)    myalgia  . Morphine And Related Itching  . Zetia [Ezetimibe] Other (See Comments)    myalgia  . Zocor [Simvastatin - High Dose] Other (See Comments)    myalgia    Current Outpatient Prescriptions  Medication Sig Dispense Refill  . albuterol (PROVENTIL HFA;VENTOLIN HFA) 108 (90 BASE) MCG/ACT inhaler Inhale 1 puff into the lungs every 6 (six) hours as needed for wheezing or shortness of breath.    Marland Kitchen  albuterol (PROVENTIL) (5 MG/ML) 0.5% nebulizer solution Take 2.5 mg by nebulization every 6 (six) hours as needed for wheezing or shortness of breath.    . Cholecalciferol (VITAMIN D3) 1000 UNITS CAPS Take 1 capsule by mouth every morning.    . clonazePAM (KLONOPIN) 1 MG tablet Take 1 mg by mouth 2 (two) times daily as needed for anxiety.    . DULoxetine (CYMBALTA) 60 MG capsule Take by mouth. 60 mg tablet every morning and 30 mg tablet qhs    . gabapentin (NEURONTIN) 100 MG capsule Take 100 mg by mouth at bedtime.    . nitroGLYCERIN (NITROSTAT) 0.4 MG SL tablet Place 1 tablet (0.4 mg total) under the tongue every 5 (five) minutes as needed for chest pain. 25 tablet 6  . OXYGEN Inhale into the lungs at bedtime. Uses O2 -- intolerant to CPAP    . prazosin (MINIPRESS) 1 MG capsule Take 1 mg by mouth at bedtime.    Marland Kitchen QUEtiapine (SEROQUEL XR) 300 MG 24 hr tablet Take 300 mg by mouth at bedtime.     . ramipril (ALTACE) 10 MG capsule Take 10 mg by mouth every morning.     . tiotropium (SPIRIVA) 18 MCG inhalation capsule Place 18 mcg into inhaler and inhale every morning.     . traZODone (DESYREL) 150 MG tablet Take 150 mg by mouth at bedtime.    Marland Kitchen zolpidem (AMBIEN CR) 12.5 MG CR tablet Take 12.5 mg by mouth at bedtime.       No current facility-administered medications for this visit.    ROS: See HPI for pertinent positives and negatives.   Physical Examination  Filed Vitals:   06/30/15 1159  BP: 135/73  Pulse: 75  Height: 6' (1.829 m)  Weight: 218 lb (98.884 kg)  SpO2: 98%   Body mass index is 29.56 kg/(m^2).  General: A&O x 3, WDWN. Gait: normal Eyes: Pupils are = Pulmonary: limited air movement in all fields, rales in both bases, rhonchi in left posterior fields. He has pursed lips expirations at rest.  Cardiac:  regular Rythm , without detected murmur.     Carotid Bruits Left Right   Negative Negative  Aorta is not palpable. Radial pulses: are: 2+ right, non  palpable left radial pulse, 2+ palpable left brachial pulse   VASCULAR EXAM: Extremities without ischemic changes  without Gangrene. Trace bilateral pitting and non pitting edema in feet and ankles.     LE Pulses LEFT RIGHT   FEMORAL  not palpable 3+ palpable    POPLITEAL Not palpable  not palpable   POSTERIOR TIBIAL palpable  Not palpable    DORSALIS PEDIS  ANTERIOR TIBIAL monophasic by Doppler and not palpable   Biphasic by Doppler and not palpable    Abdomen: soft, NT, no palpable masses. Skin: no rashes, no ulcers, see extremities. Musculoskeletal: no muscle wasting or atrophy. Neurologic: A&O X 3; Appropriate Affect,; MOTOR FUNCTION: moving all extremities equally, motor strength 5/5 throughout. Speech is fluent/normal. CN 2-12 intact except he is hard of hearing.                Non-Invasive Vascular Imaging: DATE: 06/30/2015  DUPLEX SCAN OF BYPASS: Right leg: Patent graft with evidence of 50-74% stenosis of the native CFA/distal EIA inflow, just proximal to endarterectomy.   ABI (Date: 06/30/2015)  R: 1.04 (1.19, 01/03/15), DP: triphasic, PT: triphasic, TBI: 0.37 (0.63)  L: 0.81 (1.05), DP: monophasic, PT: triphasic, TBI: 0.31 (0.15)   ASSESSMENT: IZEAR PINE is a 74 y.o. male who is status post extensive prior femoropopliteal bypasses on his right leg. He had initially had a right femoral to above-knee popliteal bypass in April 2010. This was followed by right above-knee to below-knee popliteal bypass in August of 2011. He presented with occlusion of this and underwent thrombectomy and revision of his femoral-popliteal bypass in April of 2012.  He is also s/p right common femoral and profundus femoris artery thrombectomy and  endarterectomy; right femoral to posterior tibial bypass with saphenous vein harvested from left leg on 01/10/14. He no longer has claudication symptoms with walking, no signs of ischemia in his lower extremities. His walking is limited by dyspnea, apparently from severe COPD; also has a history of lung cancer with right lobectomy.  Today's right LE arterial duplex suggests a patent graft with evidence of 50-74% stenosis of the native CFA/distal EIA inflow, just proximal to endarterectomy.  Right ABI remains normal with all triphasic waveforms. Left ABI decreased to mild arterial occlusive disease from normal, with tri and monophasic waveforms.    His left femoral pulse is not palpable today, was palpable at his previous visit.  Unfortunately he continues to smoke, see Plan.  Face to face time with patient was 25 minutes. Over 50% of this time was spent on counseling and coordination of care.   PLAN:  The patient was counseled re smoking cessation and given several free resources re smoking cessation.   Based on the patient's vascular studies and examination, pt will return to clinic in 6 months with left iliac artery duplex (non palpable left femoral pulse, drop in left ABI), ABI's, and right LE arterial duplex. I advised pt and wife to notify our office if he develops claudication sx's or non healing wounds in his feet/legs.   I discussed in depth with the patient the nature of atherosclerosis, and emphasized the importance of maximal medical management including strict control of blood pressure, blood glucose, and lipid levels, obtaining regular exercise, and cessation of smoking.  The patient is aware that without maximal medical management the  underlying atherosclerotic disease process will progress, limiting the benefit of any interventions.  The patient was given information about PAD including signs, symptoms, treatment, what symptoms should prompt the patient to seek immediate medical  care, and risk reduction measures to take.  Clemon Chambers, RN, MSN, FNP-C [facility] Phone: [phone]  Clinic MD: Early on call  06/30/2015 12:14 PM

## 2015-06-30 NOTE — Patient Instructions (Signed)

## 2015-07-11 ENCOUNTER — Encounter (HOSPITAL_COMMUNITY): Payer: Medicare Other

## 2015-07-11 ENCOUNTER — Ambulatory Visit: Payer: Medicare Other | Admitting: Family

## 2015-07-11 ENCOUNTER — Other Ambulatory Visit (HOSPITAL_COMMUNITY): Payer: Medicare Other

## 2015-09-05 ENCOUNTER — Other Ambulatory Visit: Payer: Self-pay | Admitting: Internal Medicine

## 2015-09-05 ENCOUNTER — Ambulatory Visit
Admission: RE | Admit: 2015-09-05 | Discharge: 2015-09-05 | Disposition: A | Discharge status: Home / Self Care | Payer: Medicare Other | Source: Ambulatory Visit | Attending: Internal Medicine | Admitting: Internal Medicine

## 2015-09-05 DIAGNOSIS — J441 Chronic obstructive pulmonary disease with (acute) exacerbation: Secondary | ICD-10-CM

## 2015-09-05 DIAGNOSIS — R05 Cough: Secondary | ICD-10-CM | POA: Diagnosis not present

## 2015-09-05 DIAGNOSIS — R440 Auditory hallucinations: Secondary | ICD-10-CM | POA: Diagnosis not present

## 2015-09-05 DIAGNOSIS — R0602 Shortness of breath: Secondary | ICD-10-CM | POA: Diagnosis not present

## 2015-09-05 DIAGNOSIS — F339 Major depressive disorder, recurrent, unspecified: Secondary | ICD-10-CM | POA: Diagnosis not present

## 2015-09-05 DIAGNOSIS — R52 Pain, unspecified: Secondary | ICD-10-CM | POA: Diagnosis not present

## 2015-09-25 ENCOUNTER — Other Ambulatory Visit: Payer: Self-pay | Admitting: Internal Medicine

## 2015-09-25 ENCOUNTER — Ambulatory Visit
Admission: RE | Admit: 2015-09-25 | Discharge: 2015-09-25 | Disposition: A | Discharge status: Home / Self Care | Payer: Medicare Other | Source: Ambulatory Visit | Attending: Internal Medicine | Admitting: Internal Medicine

## 2015-09-25 DIAGNOSIS — R0602 Shortness of breath: Secondary | ICD-10-CM | POA: Diagnosis not present

## 2015-09-25 DIAGNOSIS — J449 Chronic obstructive pulmonary disease, unspecified: Secondary | ICD-10-CM

## 2015-11-01 DIAGNOSIS — J449 Chronic obstructive pulmonary disease, unspecified: Secondary | ICD-10-CM | POA: Diagnosis not present

## 2015-11-01 DIAGNOSIS — I2510 Atherosclerotic heart disease of native coronary artery without angina pectoris: Secondary | ICD-10-CM | POA: Diagnosis not present

## 2015-11-01 DIAGNOSIS — I739 Peripheral vascular disease, unspecified: Secondary | ICD-10-CM | POA: Diagnosis not present

## 2015-11-01 DIAGNOSIS — I10 Essential (primary) hypertension: Secondary | ICD-10-CM | POA: Diagnosis not present

## 2015-11-01 DIAGNOSIS — G894 Chronic pain syndrome: Secondary | ICD-10-CM | POA: Diagnosis not present

## 2015-11-01 DIAGNOSIS — Z0000 Encounter for general adult medical examination without abnormal findings: Secondary | ICD-10-CM | POA: Diagnosis not present

## 2015-11-01 DIAGNOSIS — Z1389 Encounter for screening for other disorder: Secondary | ICD-10-CM | POA: Diagnosis not present

## 2015-11-01 DIAGNOSIS — G4700 Insomnia, unspecified: Secondary | ICD-10-CM | POA: Diagnosis not present

## 2015-11-30 ENCOUNTER — Encounter: Payer: Self-pay | Admitting: Cardiology

## 2015-12-06 DIAGNOSIS — I739 Peripheral vascular disease, unspecified: Secondary | ICD-10-CM | POA: Diagnosis not present

## 2015-12-06 DIAGNOSIS — I2510 Atherosclerotic heart disease of native coronary artery without angina pectoris: Secondary | ICD-10-CM | POA: Diagnosis not present

## 2015-12-06 DIAGNOSIS — J449 Chronic obstructive pulmonary disease, unspecified: Secondary | ICD-10-CM | POA: Diagnosis not present

## 2015-12-06 DIAGNOSIS — G894 Chronic pain syndrome: Secondary | ICD-10-CM | POA: Diagnosis not present

## 2016-01-11 ENCOUNTER — Encounter (HOSPITAL_COMMUNITY): Payer: Medicare Other

## 2016-01-11 ENCOUNTER — Ambulatory Visit: Payer: Medicare Other | Admitting: Family

## 2016-01-11 ENCOUNTER — Other Ambulatory Visit (HOSPITAL_COMMUNITY): Payer: Medicare Other

## 2016-01-18 ENCOUNTER — Ambulatory Visit: Payer: Medicare Other | Admitting: Family

## 2016-03-05 ENCOUNTER — Ambulatory Visit (INDEPENDENT_AMBULATORY_CARE_PROVIDER_SITE_OTHER): Payer: Medicare Other | Admitting: Pulmonary Disease

## 2016-03-05 ENCOUNTER — Encounter: Payer: Self-pay | Admitting: Family

## 2016-03-05 ENCOUNTER — Encounter: Payer: Self-pay | Admitting: Pulmonary Disease

## 2016-03-05 ENCOUNTER — Ambulatory Visit (HOSPITAL_COMMUNITY)
Admission: RE | Admit: 2016-03-05 | Discharge: 2016-03-05 | Disposition: A | Discharge status: Home / Self Care | Payer: Medicare Other | Source: Ambulatory Visit | Attending: Pulmonary Disease | Admitting: Pulmonary Disease

## 2016-03-05 VITALS — BP 138/60 | HR 74 | Ht 72.0 in | Wt 220.4 lb

## 2016-03-05 DIAGNOSIS — J432 Centrilobular emphysema: Secondary | ICD-10-CM

## 2016-03-05 DIAGNOSIS — J449 Chronic obstructive pulmonary disease, unspecified: Secondary | ICD-10-CM | POA: Diagnosis not present

## 2016-03-05 DIAGNOSIS — Z720 Tobacco use: Secondary | ICD-10-CM

## 2016-03-05 DIAGNOSIS — J9611 Chronic respiratory failure with hypoxia: Secondary | ICD-10-CM

## 2016-03-05 DIAGNOSIS — Z23 Encounter for immunization: Secondary | ICD-10-CM

## 2016-03-05 DIAGNOSIS — F17210 Nicotine dependence, cigarettes, uncomplicated: Secondary | ICD-10-CM

## 2016-03-05 DIAGNOSIS — J4489 Other specified chronic obstructive pulmonary disease: Secondary | ICD-10-CM

## 2016-03-05 NOTE — Progress Notes (Signed)
Past surgical history He  has a past surgical history that includes Femoral-tibial Bypass Graft (Right, 01/10/2014); left heart catheterization with coronary/graft angiogram (N/A, 07/12/2014); Coronary artery bypass graft (07-27-2002  dr Roxan Hockey); Cardiovascular stress test (last one  06-17-2014   dr  harding); Tonsillectomy; Femoral artery - popliteal artery bypass graft (Right, 10-24-2008   dr early); RIGHT ABOVE KNEE POPLITEAL GRAFT TO BELOW KNEE POPLITEAL ARTERY BYPASS WITH REVERSE SAPHENOUS VEIN (03-02-2010); THROMBECTOMY OF RIGHT FEMORAL TO ABOVE KNEE POPLITEAL GORETEX GRAFT ANGIOPLASTY OF GORETEX AND SAPHENOUS VEIN JUNCTION AND ABOVE KNEE POPLITEAL ARTERY  (10-06-2010   DR EARLY); Video assisted thoracoscopy (vats)/wedge resection (Right, 05-01-2011    FORSYTH); Cardiac catheterization (07-13-2002); LEFT ANKLE SURGERY (1990's); Hydrocele surgery (Left); Eye surgery (Left, Nov 2013); Colonoscopy (2008    WNL); Transurethral resection of bladder tumor (N/A, 02/21/2015); and Cystoscopy w/ ureteral stent placement (N/A, 02/21/2015).  Family history His family history includes Alzheimer's disease (age of onset: 3) in his father; Other (age of onset: 37) in his mother; Stroke in his father and mother.  Social history He  reports that he has been smoking Cigarettes.  He has a 27.50 pack-year smoking history. He has never used smokeless tobacco. He reports that he does not drink alcohol or use drugs.  Allergies  Allergen Reactions  . Aripiprazole Other (See Comments)    "makes him wild"  . Aspirin Other (See Comments)    Avoids -- bruises real bad  . Crestor [Rosuvastatin Calcium] Other (See Comments)    myalgia  . Effexor [Venlafaxine Hydrochloride] Itching  . Lipitor [Atorvastatin Calcium] Other (See Comments)    myalgia  . Morphine And Related Itching  . Zetia [Ezetimibe] Other (See Comments)    myalgia  . Zocor [Simvastatin - High Dose] Other (See Comments)    myalgia    Current  Outpatient Prescriptions on File Prior to Visit  Medication Sig  . albuterol (PROVENTIL HFA;VENTOLIN HFA) 108 (90 BASE) MCG/ACT inhaler Inhale 1 puff into the lungs every 6 (six) hours as needed for wheezing or shortness of breath.  Marland Kitchen albuterol (PROVENTIL) (5 MG/ML) 0.5% nebulizer solution Take 2.5 mg by nebulization every 6 (six) hours as needed for wheezing or shortness of breath.  . Cholecalciferol (VITAMIN D3) 1000 UNITS CAPS Take 1 capsule by mouth every morning.  . clonazePAM (KLONOPIN) 1 MG tablet Take 1 mg by mouth 2 (two) times daily as needed for anxiety.  . DULoxetine (CYMBALTA) 60 MG capsule Take by mouth. 60 mg tablet every morning and 30 mg tablet qhs  . gabapentin (NEURONTIN) 100 MG capsule Take 100 mg by mouth 3 (three) times daily.   . nitroGLYCERIN (NITROSTAT) 0.4 MG SL tablet Place 1 tablet (0.4 mg total) under the tongue every 5 (five) minutes as needed for chest pain.  . OXYGEN Inhale into the lungs at bedtime. Uses O2 -- intolerant to CPAP  . prazosin (MINIPRESS) 1 MG capsule Take 1 mg by mouth at bedtime.  Marland Kitchen QUEtiapine (SEROQUEL XR) 300 MG 24 hr tablet Take 150 mg by mouth at bedtime.   . ramipril (ALTACE) 10 MG capsule Take 5 mg by mouth every morning.   . tiotropium (SPIRIVA) 18 MCG inhalation capsule Place 18 mcg into inhaler and inhale every morning.   . traZODone (DESYREL) 150 MG tablet Take 150 mg by mouth at bedtime.  Marland Kitchen zolpidem (AMBIEN CR) 12.5 MG CR tablet Take 12.5 mg by mouth at bedtime.     No current facility-administered medications on file prior to visit.  Chief Complaint  Patient presents with  . Pulmonary  Consult    Pt wants to establish with lung doctor in [place] - Has been seeing Dr [facility] since having Right Thoracotomy in 2012 for squamous cell ( this was done [facility]) - c/o SOB with walking fast and doing simple tasks - Pt was given oxygen at one time but did not requalify so it was d/ced - PT still has a concentraotor that  he owns but no portable -     Pulmonary tests CT chest 04/20/15 >> atherosclerosis, mild centrilobular/paraseptal emphysema, s/p RLL ectomy Spirometry 03/05/16 >> FEV1 1.21 (36%), FEV1% 54%  Past medical history He  has a past medical history of Anxiety; Arthritis; CAD (coronary artery disease); Complication of anesthesia; COPD, severe ([place]); Depression; Dyspnea on exertion; Essential hypertension; Hearing loss of both ears; History of arterial bypass of lower extremity; History of CHF (congestive heart failure); History of DVT of lower extremity; History of panic attacks; Hyperlipidemia with target LDL less than 70; Left main coronary artery disease (Jan 2004); Neuropathy, peripheral ([place]); No natural teeth; Nocturia; OSA (obstructive sleep apnea); Peripheral arterial occlusive disease ([place]) (followed by dr Early--  per last dopplers 06-28-2014 no change right graft,  50-74% stenosis common and left mid superficial femoral artery); PTSD (post-traumatic stress disorder); Recurrent productive cough; S/P CABG x 4 (Jan 2004); Smoker unmotivated to quit; Squamous cell lung cancer ([place]) (2012-2014   (oncologist at [facility] -- dr Bretta Bang--  no recurrence , continued observation); and Wears glasses.  Vital signs BP 138/60   Pulse 74   Ht 6' (1.829 m)   Wt 220 lb 6.4 oz (100 kg)   SpO2 96%   BMI 29.89 kg/m   History of present illness John Ball is a 75 y.o. male smoker with COPD.  He was being followed at [place], but wants to establish with pulmonary in [place].  He has extensive history of smoking, and COPD with emphysema and chronic bronchitis.  He continues to smoke cigarettes.  He had Rt lower lobectomy in 2012 for lung cancer and is followed by Dr. Julien Nordmann.    He was using oxygen during the day, but this was taken away by [place].  He has oxygen concentrator that he uses at night.  This has been paid for and is his.  He feels he needs oxygen during the day again.  He has cough with  clear sputum.  He gets episodes of wheezing and intermittent chest discomfort.  His walking is limited by dyspnea, chest discomfort and leg cramps.  He gets ankle swelling if he keeps his legs down >> better when he puts his legs up.  He has been using spiriva, and advair >> these help.  He has been using albuterol 4 times per day >> he was told to do this, but doesn't feel like he needs so often.  He gets tremor if he uses albuterol too much >> was told to increase his gabapentin to help with these tremors, but hasn't increased dose yet.  Physical exam  General - No distress ENT - No sinus tenderness, no oral exudate, no LAN, no thyromegaly, TM clear, pupils equal/reactive Cardiac - s1s2 regular, no murmur, pulses symmetric Chest - decreased breath sounds, scattered crackles that clear with coughing, no wheeze Back - No focal tenderness Abd - Soft, non-tender, no organomegaly, + bowel sounds Ext - 1+ ankle edema Neuro - Normal strength, cranial nerves intact Skin - No  rashes Psych - Normal mood, and behavior   CMP Latest Ref Rng & Units 02/21/2015 07/07/2014 05/02/2014  Glucose 65 - 99 mg/dL 103(H) 82 101  BUN 6 - 23 mg/dL - 16 15.7  Creatinine 0.50 - 1.35 mg/dL - 1.21 1.1  Sodium 135 - 145 mmol/L 141 139 139  Potassium 3.5 - 5.1 mmol/L 4.5 4.3 4.1  Chloride 96 - 112 mEq/L - 103 -  CO2 19 - 32 mEq/L - 24 24  Calcium 8.4 - 10.5 mg/dL - 9.5 9.5  Total Protein 6.4 - 8.3 g/dL - - 6.7  Total Bilirubin 0.20 - 1.20 mg/dL - - 0.40  Alkaline Phos 40 - 150 U/L - - 61  AST 5 - 34 U/L - - 14  ALT 0 - 55 U/L - - 14     CBC Latest Ref Rng & Units 02/21/2015 07/07/2014 05/02/2014  WBC 4.0 - 10.5 K/uL - 6.3 5.2  Hemoglobin 13.0 - 17.0 g/dL 16.3 16.0 14.4  Hematocrit 39.0 - 52.0 % 48.0 46.5 43.9  Platelets 150 - 400 K/uL - 174 142    Discussion 75 yo male smoker with hx of COPD with emphysema.  He has continued symptoms of cough, wheeze, chest congestion, and dyspnea.  He has hx of CAD, PAD,  and lung cancer s/p Rt lower lobectomy.   Assessment/plan  COPD with emphysema and chronic bronchitis. - continue advair, spiriva - explained that he only needs to use albuterol prn - flu shot today - he reports having received prevnar and pneumovax previously - prn mucinex  Chronic respiratory failure with hypoxia. - he is concerned he needs supplemental oxygen during the day >> he was not able to complete ambulatory oximetry today due to chest discomfort and leg pains - will arrange for ABG on room air - continue 2 liters oxygen at night  Tobacco abuse. - encouraged him to try nicotine patch to help with smoking cessation  Tremor. - advised he could continue gabapentin nightly for now, and monitor occurrence of tremor as he decreases frequency of albuterol use >> if tremor persists, then he should increase gabapentin frequency   Patient Instructions  Flu shot today  Will arrange for arterial blood gas testing  Mucinex 1200 mg twice per day as needed  Follow up in 4 months    Chesley Mires, MD [place] [facility]Sleep Pager:  [phone] 03/05/2016, 11:37 AM

## 2016-03-05 NOTE — Patient Instructions (Addendum)
Flu shot today  Will arrange for arterial blood gas testing  Mucinex 1200 mg twice per day as needed  Follow up in 4 months

## 2016-03-06 ENCOUNTER — Encounter: Payer: Self-pay | Admitting: Family

## 2016-03-06 LAB — BLOOD GAS, ARTERIAL
Acid-Base Excess: 1.3 mmol/L (ref 0.0–2.0)
Bicarbonate: 25.8 mmol/L (ref 20.0–28.0)
DRAWN BY: 27052
FIO2: 0.21
O2 Saturation: 91.2 %
PATIENT TEMPERATURE: 98.6
TCO2: 22.7 mmol/L (ref 0–100)
pCO2 arterial: 42.6 mmHg (ref 32.0–48.0)
pH, Arterial: 7.4 (ref 7.350–7.450)
pO2, Arterial: 59.3 mmHg — ABNORMAL LOW (ref 83.0–108.0)

## 2016-03-07 ENCOUNTER — Telehealth: Payer: Self-pay | Admitting: Pulmonary Disease

## 2016-03-07 ENCOUNTER — Ambulatory Visit (INDEPENDENT_AMBULATORY_CARE_PROVIDER_SITE_OTHER): Payer: Medicare Other | Admitting: Family

## 2016-03-07 ENCOUNTER — Ambulatory Visit (INDEPENDENT_AMBULATORY_CARE_PROVIDER_SITE_OTHER)
Admission: RE | Admit: 2016-03-07 | Discharge: 2016-03-07 | Disposition: A | Discharge status: Home / Self Care | Payer: Medicare Other | Source: Ambulatory Visit | Attending: Family | Admitting: Family

## 2016-03-07 ENCOUNTER — Encounter: Payer: Self-pay | Admitting: Family

## 2016-03-07 ENCOUNTER — Ambulatory Visit (HOSPITAL_COMMUNITY)
Admission: RE | Admit: 2016-03-07 | Discharge: 2016-03-07 | Disposition: A | Discharge status: Home / Self Care | Payer: Medicare Other | Source: Ambulatory Visit | Attending: Family | Admitting: Family

## 2016-03-07 ENCOUNTER — Other Ambulatory Visit: Payer: Self-pay | Admitting: Vascular Surgery

## 2016-03-07 VITALS — BP 142/85 | HR 84 | Temp 97.6°F | Resp 20 | Ht 72.0 in | Wt 221.0 lb

## 2016-03-07 DIAGNOSIS — Z4889 Encounter for other specified surgical aftercare: Secondary | ICD-10-CM | POA: Diagnosis not present

## 2016-03-07 DIAGNOSIS — I779 Disorder of arteries and arterioles, unspecified: Secondary | ICD-10-CM

## 2016-03-07 DIAGNOSIS — I498 Other specified cardiac arrhythmias: Secondary | ICD-10-CM

## 2016-03-07 DIAGNOSIS — F17200 Nicotine dependence, unspecified, uncomplicated: Secondary | ICD-10-CM

## 2016-03-07 DIAGNOSIS — Z95828 Presence of other vascular implants and grafts: Secondary | ICD-10-CM

## 2016-03-07 DIAGNOSIS — Z48812 Encounter for surgical aftercare following surgery on the circulatory system: Secondary | ICD-10-CM

## 2016-03-07 DIAGNOSIS — J432 Centrilobular emphysema: Secondary | ICD-10-CM

## 2016-03-07 DIAGNOSIS — R0989 Other specified symptoms and signs involving the circulatory and respiratory systems: Secondary | ICD-10-CM

## 2016-03-07 DIAGNOSIS — I70201 Unspecified atherosclerosis of native arteries of extremities, right leg: Secondary | ICD-10-CM | POA: Insufficient documentation

## 2016-03-07 DIAGNOSIS — Z720 Tobacco use: Secondary | ICD-10-CM

## 2016-03-07 DIAGNOSIS — I83892 Varicose veins of left lower extremities with other complications: Secondary | ICD-10-CM

## 2016-03-07 DIAGNOSIS — J9611 Chronic respiratory failure with hypoxia: Secondary | ICD-10-CM

## 2016-03-07 NOTE — Telephone Encounter (Signed)
Spoke with pt's wife. She is requesting the pt's blood work results. Advised her that I would get this message to VS to address. She is aware that he does not come in today until 3pm.  VS - please advise. Thanks.

## 2016-03-07 NOTE — Patient Instructions (Signed)
Peripheral Vascular Disease Peripheral vascular disease (PVD) is a disease of the blood vessels that are not part of your heart and brain. A simple term for PVD is poor circulation. In most cases, PVD narrows the blood vessels that carry blood from your heart to the rest of your body. This can result in a decreased supply of blood to your arms, legs, and internal organs, like your stomach or kidneys. However, it most often affects a person's lower legs and feet. There are two types of PVD.  Organic PVD. This is the more common type. It is caused by damage to the structure of blood vessels.  Functional PVD. This is caused by conditions that make blood vessels contract and tighten (spasm). Without treatment, PVD tends to get worse over time. PVD can also lead to acute ischemic limb. This is when an arm or limb suddenly has trouble getting enough blood. This is a medical emergency. CAUSES Each type of PVD has many different causes. The most common cause of PVD is buildup of a fatty material (plaque) inside of your arteries (atherosclerosis). Small amounts of plaque can break off from the walls of the blood vessels and become lodged in a smaller artery. This blocks blood flow and can cause acute ischemic limb. Other common causes of PVD include:  Blood clots that form inside of blood vessels.  Injuries to blood vessels.  Diseases that cause inflammation of blood vessels or cause blood vessel spasms.  Health behaviors and health history that increase your risk of developing PVD. RISK FACTORS  You may have a greater risk of PVD if you:  Have a family history of PVD.  Have certain medical conditions, including:  High cholesterol.  Diabetes.  High blood pressure (hypertension).  Coronary heart disease.  Past problems with blood clots.  Past injury, such as burns or a broken bone. These may have damaged blood vessels in your limbs.  Buerger disease. This is caused by inflamed blood  vessels in your hands and feet.  Some forms of arthritis.  Rare birth defects that affect the arteries in your legs.  Use tobacco.  Do not get enough exercise.  Are obese.  Are age 50 or older. SIGNS AND SYMPTOMS  PVD may cause many different symptoms. Your symptoms depend on what part of your body is not getting enough blood. Some common signs and symptoms include:  Cramps in your lower legs. This may be a symptom of poor leg circulation (claudication).  Pain and weakness in your legs while you are physically active that goes away when you rest (intermittent claudication).  Leg pain when at rest.  Leg numbness, tingling, or weakness.  Coldness in a leg or foot, especially when compared with the other leg.  Skin or hair changes. These can include:  Hair loss.  Shiny skin.  Pale or bluish skin.  Thick toenails.  Inability to get or maintain an erection (erectile dysfunction). People with PVD are more prone to developing ulcers and sores on their toes, feet, or legs. These may take longer than normal to heal. DIAGNOSIS Your health care provider may diagnose PVD from your signs and symptoms. The health care provider will also do a physical exam. You may have tests to find out what is causing your PVD and determine its severity. Tests may include:  Blood pressure recordings from your arms and legs and measurements of the strength of your pulses (pulse volume recordings).  Imaging studies using sound waves to take pictures of   the blood flow through your blood vessels (Doppler ultrasound).  Injecting a dye into your blood vessels before having imaging studies using:  X-rays (angiogram or arteriogram).  Computer-generated X-rays (CT angiogram).  A powerful electromagnetic field and a computer (magnetic resonance angiogram or MRA). TREATMENT Treatment for PVD depends on the cause of your condition and the severity of your symptoms. It also depends on your age. Underlying  causes need to be treated and controlled. These include long-lasting (chronic) conditions, such as diabetes, high cholesterol, and high blood pressure. You may need to first try making lifestyle changes and taking medicines. Surgery may be needed if these do not work. Lifestyle changes may include:  Quitting smoking.  Exercising regularly.  Following a low-fat, low-cholesterol diet. Medicines may include:  Blood thinners to prevent blood clots.  Medicines to improve blood flow.  Medicines to improve your blood cholesterol levels. Surgical procedures may include:  A procedure that uses an inflated balloon to open a blocked artery and improve blood flow (angioplasty).  A procedure to put in a tube (stent) to keep a blocked artery open (stent implant).  Surgery to reroute blood flow around a blocked artery (peripheral bypass surgery).  Surgery to remove dead tissue from an infected wound on the affected limb.  Amputation. This is surgical removal of the affected limb. This may be necessary in cases of acute ischemic limb that are not improved through medical or surgical treatments. HOME CARE INSTRUCTIONS  Take medicines only as directed by your health care provider.  Do not use any tobacco products, including cigarettes, chewing tobacco, or electronic cigarettes. If you need help quitting, ask your health care provider.  Lose weight if you are overweight, and maintain a healthy weight as directed by your health care provider.  Eat a diet that is low in fat and cholesterol. If you need help, ask your health care provider.  Exercise regularly. Ask your health care provider to suggest some good activities for you.  Use compression stockings or other mechanical devices as directed by your health care provider.  Take good care of your feet.  Wear comfortable shoes that fit well.  Check your feet often for any cuts or sores. SEEK MEDICAL CARE IF:  You have cramps in your legs  while walking.  You have leg pain when you are at rest.  You have coldness in a leg or foot.  Your skin changes.  You have erectile dysfunction.  You have cuts or sores on your feet that are not healing. SEEK IMMEDIATE MEDICAL CARE IF:  Your arm or leg turns cold and blue.  Your arms or legs become red, warm, swollen, painful, or numb.  You have chest pain or trouble breathing.  You suddenly have weakness in your face, arm, or leg.  You become very confused or lose the ability to speak.  You suddenly have a very bad headache or lose your vision.   This information is not intended to replace advice given to you by your health care provider. Make sure you discuss any questions you have with your health care provider.   Document Released: 08/01/2004 Document Revised: 07/15/2014 Document Reviewed: 12/02/2013 Elsevier Interactive Patient Education 2016 [employer]     Steps to Quit Smoking  Smoking tobacco can be harmful to your health and can affect almost every organ in your body. Smoking puts you, and those around you, at risk for developing many serious chronic diseases. Quitting smoking is difficult, but it is one of   the best things that you can do for your health. It is never too late to quit. WHAT ARE THE BENEFITS OF QUITTING SMOKING? When you quit smoking, you lower your risk of developing serious diseases and conditions, such as:  Lung cancer or lung disease, such as COPD.  Heart disease.  Stroke.  Heart attack.  Infertility.  Osteoporosis and bone fractures. Additionally, symptoms such as coughing, wheezing, and shortness of breath may get better when you quit. You may also find that you get sick less often because your body is stronger at fighting off colds and infections. If you are pregnant, quitting smoking can help to reduce your chances of having a baby of low birth weight. HOW DO I GET READY TO QUIT? When you decide to quit smoking, create a plan to  make sure that you are successful. Before you quit:  Pick a date to quit. Set a date within the next two weeks to give you time to prepare.  Write down the reasons why you are quitting. Keep this list in places where you will see it often, such as on your bathroom mirror or in your car or wallet.  Identify the people, places, things, and activities that make you want to smoke (triggers) and avoid them. Make sure to take these actions:  Throw away all cigarettes at home, at work, and in your car.  Throw away smoking accessories, such as ashtrays and lighters.  Clean your car and make sure to empty the ashtray.  Clean your home, including curtains and carpets.  Tell your family, friends, and coworkers that you are quitting. Support from your loved ones can make quitting easier.  Talk with your health care provider about your options for quitting smoking.  Find out what treatment options are covered by your health insurance. WHAT STRATEGIES CAN I USE TO QUIT SMOKING?  Talk with your healthcare provider about different strategies to quit smoking. Some strategies include:  Quitting smoking altogether instead of gradually lessening how much you smoke over a period of time. Research shows that quitting "cold turkey" is more successful than gradually quitting.  Attending in-person counseling to help you build problem-solving skills. You are more likely to have success in quitting if you attend several counseling sessions. Even short sessions of 10 minutes can be effective.  Finding resources and support systems that can help you to quit smoking and remain smoke-free after you quit. These resources are most helpful when you use them often. They can include:  Online chats with a counselor.  Telephone quitlines.  Printed self-help materials.  Support groups or group counseling.  Text messaging programs.  Mobile phone applications.  Taking medicines to help you quit smoking. (If you are  pregnant or breastfeeding, talk with your health care provider first.) Some medicines contain nicotine and some do not. Both types of medicines help with cravings, but the medicines that include nicotine help to relieve withdrawal symptoms. Your health care provider may recommend:  Nicotine patches, gum, or lozenges.  Nicotine inhalers or sprays.  Non-nicotine medicine that is taken by mouth. Talk with your health care provider about combining strategies, such as taking medicines while you are also receiving in-person counseling. Using these two strategies together makes you more likely to succeed in quitting than if you used either strategy on its own. If you are pregnant or breastfeeding, talk with your health care provider about finding counseling or other support strategies to quit smoking. Do not take medicine to help you   quit smoking unless told to do so by your health care provider. WHAT THINGS CAN I DO TO MAKE IT EASIER TO QUIT? Quitting smoking might feel overwhelming at first, but there is a lot that you can do to make it easier. Take these important actions:  Reach out to your family and friends and ask that they support and encourage you during this time. Call telephone quitlines, reach out to support groups, or work with a counselor for support.  Ask people who smoke to avoid smoking around you.  Avoid places that trigger you to smoke, such as bars, parties, or smoke-break areas at work.  Spend time around people who do not smoke.  Lessen stress in your life, because stress can be a smoking trigger for some people. To lessen stress, try:  Exercising regularly.  Deep-breathing exercises.  Yoga.  Meditating.  Performing a body scan. This involves closing your eyes, scanning your body from head to toe, and noticing which parts of your body are particularly tense. Purposefully relax the muscles in those areas.  Download or purchase mobile phone or tablet apps (applications)  that can help you stick to your quit plan by providing reminders, tips, and encouragement. There are many free apps, such as QuitGuide from the CDC (Centers for Disease Control and Prevention). You can find other support for quitting smoking (smoking cessation) through smokefree.gov and other websites. HOW WILL I FEEL WHEN I QUIT SMOKING? Within the first 24 hours of quitting smoking, you may start to feel some withdrawal symptoms. These symptoms are usually most noticeable 2-3 days after quitting, but they usually do not last beyond 2-3 weeks. Changes or symptoms that you might experience include:  Mood swings.  Restlessness, anxiety, or irritation.  Difficulty concentrating.  Dizziness.  Strong cravings for sugary foods in addition to nicotine.  Mild weight gain.  Constipation.  Nausea.  Coughing or a sore throat.  Changes in how your medicines work in your body.  A depressed mood.  Difficulty sleeping (insomnia). After the first 2-3 weeks of quitting, you may start to notice more positive results, such as:  Improved sense of smell and taste.  Decreased coughing and sore throat.  Slower heart rate.  Lower blood pressure.  Clearer skin.  The ability to breathe more easily.  Fewer sick days. Quitting smoking is very challenging for most people. Do not get discouraged if you are not successful the first time. Some people need to make many attempts to quit before they achieve long-term success. Do your best to stick to your quit plan, and talk with your health care provider if you have any questions or concerns.   This information is not intended to replace advice given to you by your health care provider. Make sure you discuss any questions you have with your health care provider.   Document Released: 06/18/2001 Document Revised: 11/08/2014 Document Reviewed: 11/08/2014 Elsevier Interactive Patient Education 2016 [employer]  

## 2016-03-07 NOTE — Telephone Encounter (Signed)
ABG    Component Value Date/Time   PHART 7.400 03/05/2016 1222   PCO2ART 42.6 03/05/2016 1222   PO2ART 59.3 (L) 03/05/2016 1222   HCO3 25.8 03/05/2016 1222   TCO2 22.7 03/05/2016 1222   O2SAT 91.2 03/05/2016 1222    He has peripheral edema and has PaO2 < 60 mmHg in chronic stable condition on room air at rest.  Results d/w pt's wife.  Explained this should qualify him for supplemental oxygen set up during the day.  Will send order to have [facility] arrange for 2 liters oxygen 24/7.

## 2016-03-07 NOTE — Progress Notes (Signed)
[facility] [place]   CC: Follow up peripheral artery occlusive disease  History of Present Illness John Ball is a 75 y.o. male patient of Dr. Donnetta Hutching who is status post extensive prior femoropopliteal bypasses on his right leg. He had initially had a right femoral to above-knee popliteal bypass in April 2010. This was followed by right above-knee to below-knee popliteal bypass in August of 2011. He presented with occlusion of this and underwent thrombectomy and revision of his femoral-popliteal bypass in April of 2012.  He is also s/p right common femoral and profundus femoris artery thrombectomy and endarterectomy; right femoral to posterior tibial bypass with saphenous vein harvested from left leg on 01/10/14. He has a history of DVT. He returns today for routine follow up. He walks about 30 minutes daily and no longer has claudication symptoms with walking, denies non healing wounds in feet or lower legs. His walking is limited by his dyspnea.  He denies any history of stroke or TIA, denies history of MI, but did have a CABG.  He has a history of lung cancer with right lobectomy.  The patient reports New Medical or Surgical History: he had bladder lesion --removed in the Summer of 2016, states not cancer  Pt Diabetic: No Pt smoker: smoker (less than 1 ppd, started at age 25 yrs)  Pt meds include: Statin :No, statins caused itching ASA: No, record indicates that ASA caused GI bleed, but wife states this is not the case, that he bruised easily taking 81 mg ASA daily Other anticoagulants/antiplatelets: no   Past Medical History:  Diagnosis Date  . Anxiety   . Arthritis   . CAD (coronary artery disease)    cardiologist-  dr kelly/ dr berry  ----  s/p  cabg x4 in 2004  w/ severe LM disease  . Complication of anesthesia    hx prolonged post op oxygen dependent/  hx hallucinations for 2 day post op lobectomy 2012  . COPD, severe ([place])   . Depression    . Dyspnea on exertion    CHRONIC  . Essential hypertension   . Hearing loss of both ears    does wear his hearing aids  . History of arterial bypass of lower extremity    RIGHT FEM-POP  . History of CHF (congestive heart failure)   . History of DVT of lower extremity   . History of panic attacks   . Hyperlipidemia with target LDL less than 70   . Left main coronary artery disease Jan 2004   a) Abn Myoview (RCA Ischemia) --> CATH: Ostial LM 60-70%, 50% mCx, 60-70% mid RI, 70-90-75% m-d RCA w/ 95% PLS --> CABG; b) Echo 8/'07: Normal LV thickness and function, mild LV dilation. Essentially normal echo  . Neuropathy, peripheral ([place])   . 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 Nocturia   . OSA (obstructive sleep apnea)    uses O2  via [place]  --  intolerate to CPAP  . Peripheral arterial occlusive disease ([place]) followed by dr Early--  per last dopplers 06-28-2014 no change right graft,  50-74% stenosis common and left mid superficial femoral artery   2011   a) Gore-Tex graft right AK popA-BK popA --> b) 2012: thrombosed graft --> thrombectomy with dacron patch = 1 V runnof via Peroneal; c) 01/2014: R femoropopliteal bypass with left femoral vein  . PTSD (post-traumatic stress disorder)    anxiety attack's---  [place] [employer]  .  Recurrent productive cough    SMOKER'S COUGH  . 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 Diaphragmatic attenuation  . Smoker unmotivated to quit    Reportedly he came close to having a nervous breakdown when he last tried to quit  . 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 Wears glasses     Social History Social History  Substance Use Topics  . Smoking status: Current Every Day Smoker    Packs/day: 0.50    Years: 55.00    Types: Cigarettes  . Smokeless tobacco: Never Used  . Alcohol use No    Family History Family History  Problem Relation Age of Onset  . Other Mother 55    Abdominal Aortic Aneurysm   . Stroke Mother   . Alzheimer's disease Father 16  . Stroke Father     Past Surgical History:  Procedure Laterality Date  . CARDIAC CATHETERIZATION  07-13-2002   Ischemia RCA region on Cardiolite// 60-70% ostial left main, 50% midCx, 60-70% mid RI,  70% mRCA, 90% JeNu and crux 75% RCA ,  95% PLA//  severe LM and 3Vessel CAD, perserved LV, ef 60%  . CARDIOVASCULAR STRESS TEST  last one  06-17-2014   dr  Ellyn Hack   [place] Adam study with no exercise; Intermediate Risk Study;   moderate size and intensity, partially reversible inferior septal defect consistent with prior infarct and mild to moderate peri-infarct ischemia;  mild hypokinesis of the inferior septal wall,  normal LV function, ef 56%  . COLONOSCOPY  2008    WNL  . CORONARY ARTERY BYPASS GRAFT  07-27-2002  dr Malvin Johns, freeRIMA-RI,SVG-OM2, SVG-rPDA  . CYSTOSCOPY W/ URETERAL STENT PLACEMENT N/A 02/21/2015   Procedure: BILATERAL RETROGRADE PYELOGRAM;  Surgeon: Festus Aloe, MD;  Location: [facility];  Service: Urology;  Laterality: N/A;  . EYE SURGERY Left Nov 2013  . FEMORAL ARTERY - POPLITEAL ARTERY BYPASS GRAFT Right 10-24-2008   dr early   w/ GoreTex graft  . FEMORAL-TIBIAL BYPASS GRAFT Right 01/10/2014   Procedure: Right Femoral to Posterior Tibial Bypass Graft using Left Leg Vein, Thrombectomy Right Common Femoral and Profunda of Leg . ;  Surgeon: Rosetta Posner, MD;  Location: [facility];  Service:  Vascular;  Laterality: Right;  . HYDROCELE EXCISION Left   . LEFT ANKLE SURGERY  1990's  . LEFT HEART CATHETERIZATION WITH CORONARY/GRAFT ANGIOGRAM N/A 07/12/2014   Procedure: LEFT HEART CATHETERIZATION WITH Beatrix Fetters;  Surgeon: Troy Sine, MD;  Location: [facility] CATH LAB;  Service: Cardiovascular;  Laterality: N/A;  mild LV dysfunction,  ef 45-50% , mild inferior hypocontractility; sig. native coronary obstuctive disease with left to right predominant septal collateralization;  occluded SVG to RCA graft , other graft's patent  . RIGHT ABOVE KNEE POPLITEAL GRAFT TO BELOW KNEE POPLITEAL ARTERY BYPASS WITH REVERSE SAPHENOUS VEIN  03-02-2010  .  THROMBECTOMY OF RIGHT FEMORAL TO ABOVE KNEE POPLITEAL GORETEX GRAFT ANGIOPLASTY OF GORETEX AND SAPHENOUS VEIN JUNCTION AND ABOVE KNEE POPLITEAL ARTERY   10-06-2010   DR EARLY  . TONSILLECTOMY    . TRANSURETHRAL RESECTION OF BLADDER TUMOR N/A 02/21/2015   Procedure: TRANSURETHRAL RESECTION OF BLADDER TUMOR (TURBT);  Surgeon: Festus Aloe, MD;  Location: [facility];  Service: Urology;  Laterality: N/A;  . VIDEO ASSISTED THORACOSCOPY (VATS)/WEDGE RESECTION Right 05-01-2011    FORSYTH   LOWER LOBECTOMY W/  NODE DISSECTION    Allergies  Allergen Reactions  . Aripiprazole Other (See Comments)    "makes him wild"  . Aspirin Other (See Comments)    Avoids -- bruises real bad  . Crestor [Rosuvastatin Calcium] Other (See Comments)    myalgia  . Effexor [Venlafaxine Hydrochloride] Itching  . Lipitor [Atorvastatin Calcium] Other (See Comments)    myalgia  . Morphine And Related Itching  . Zetia [Ezetimibe] Other (See Comments)    myalgia  . Zocor [Simvastatin - High Dose] Other (See Comments)    myalgia    Current Outpatient Prescriptions  Medication Sig Dispense Refill  . albuterol (PROVENTIL HFA;VENTOLIN HFA) 108 (90 BASE) MCG/ACT inhaler Inhale 1 puff into the lungs every 6 (six) hours as needed for wheezing or shortness of  breath.    Marland Kitchen albuterol (PROVENTIL) (5 MG/ML) 0.5% nebulizer solution Take 2.5 mg by nebulization every 6 (six) hours as needed for wheezing or shortness of breath.    . Cholecalciferol (VITAMIN D3) 1000 UNITS CAPS Take 1 capsule by mouth every morning.    . clonazePAM (KLONOPIN) 1 MG tablet Take 1 mg by mouth 2 (two) times daily as needed for anxiety.    . DULoxetine (CYMBALTA) 60 MG capsule Take by mouth. 60 mg tablet every morning and 30 mg tablet qhs    . Fluticasone-Salmeterol (ADVAIR) 250-50 MCG/DOSE AEPB Inhale 1 puff into the lungs 2 (two) times daily.    Marland Kitchen gabapentin (NEURONTIN) 100 MG capsule Take 100 mg by mouth 3 (three) times daily.     . nitroGLYCERIN (NITROSTAT) 0.4 MG SL tablet Place 1 tablet (0.4 mg total) under the tongue every 5 (five) minutes as needed for chest pain. 25 tablet 6  . OXYGEN Inhale into the lungs at bedtime. Uses O2 -- intolerant to CPAP    . prazosin (MINIPRESS) 1 MG capsule Take 1 mg by mouth at bedtime.    Marland Kitchen QUEtiapine (SEROQUEL XR) 300 MG 24 hr tablet Take 150 mg by mouth at bedtime.     . ramipril (ALTACE) 10 MG capsule Take 5 mg by mouth every morning.     . tiotropium (SPIRIVA) 18 MCG inhalation capsule Place 18 mcg into inhaler and inhale every morning.     . traZODone (DESYREL) 150 MG tablet Take 150 mg by mouth at bedtime.    Marland Kitchen zolpidem (AMBIEN CR) 12.5 MG CR tablet Take 12.5 mg by mouth at bedtime.       No current facility-administered medications for this visit.     ROS: See HPI for pertinent positives and negatives.   Physical Examination  Vitals:   03/07/16 1006 03/07/16 1008  BP: (!) 145/88 (!) 142/85  Pulse: 84   Resp: 20   Temp: 97.6 F (36.4 C)   TempSrc: Oral   SpO2: 95%   Weight: 221 lb (100.2 kg)   Height: 6' (1.829 m)    Body mass index is 29.97 kg/m.  General: A&O x 3, WDWN. Gait: normal  Eyes: Pupils are = Pulmonary: limited air movement in all fields, rales in both bases, rhonchi in left posterior fields. He has  pursed lips expirations at rest, is dyspneic with walking.  Cardiac: regular rhythm, no detected murmur.     Carotid Bruits Left Right   Negative Negative  Aorta is not palpable. Radial pulses: are: 2+ right, non palpable left radial pulse, 2+ palpable left brachial pulse   VASCULAR EXAM: Extremities without ischemic changes  without Gangrene. Trace bilateral pitting and non pitting edema in feet and ankles.     LE Pulses LEFT RIGHT   FEMORAL  not palpable 2+ palpable    POPLITEAL Not palpable  not palpable   POSTERIOR TIBIAL palpable  Not palpable    DORSALIS PEDIS  ANTERIOR TIBIAL monophasic by Doppler and not palpable   monophasic by Doppler and not palpable    Abdomen: soft, NT, no palpable masses. Skin: no rashes, no ulcers, see extremities. Musculoskeletal: no muscle wasting or atrophy. Neurologic: A&O X 3; Appropriate Affect,; MOTOR FUNCTION: moving all extremities equally, motor strength 5/5 throughout. Speech is fluent/normal. CN 2-12 intact except he is hard of hearing. Psychiatric: Irritable.     ASSESSMENT: John Ball is a 76 y.o. male  who is status post extensive prior femoropopliteal bypasses on his right leg. He had initially had a right femoral to above-knee popliteal bypass in April 2010. This was followed by right above-knee to below-knee popliteal bypass in August of 2011. He presented with occlusion of this and underwent thrombectomy and revision of his femoral-popliteal bypass in April of 2012.  He is also s/p right common femoral and profundus femoris artery thrombectomy and endarterectomy; right femoral to posterior tibial bypass with saphenous vein harvested from left leg on 01/10/14. He no longer has  claudication symptoms with walking, no signs of ischemia in his lower extremities. His walking is limited by dyspnea, apparently from severe COPD; also has a history of lung cancer with right lobectomy. Non palpable left femoral artery.  DATA Today's right LE arterial duplex suggests a patent graft with evidence of 50-74% stenosis of the native CFA/distal EIA inflow, just proximal to endarterectomy site.  No significant change from exam of 06/30/15. Right ABI decreased to 95% from 100% with tri and monophasic waveforms, right TBI at 0.34 Left ABI remains stable at 80% (mild arterial occlusive disease), with tri and monophasic waveforms, TBI at 0.31 Aortoiliac duplex: Left CFA with large calcific plaque, velocity of 170 cm/s, not hemodynamically significant.   Unfortunately he continues to smoke, see Plan.   PLAN:  The patient was counseled re smoking cessation and given several free resources re smoking cessation.   Based on the patient's vascular studies and examination, pt will return to clinic in 1 year with ABI's, and right LE arterial duplex.  I discussed in depth with the patient the nature of atherosclerosis, and emphasized the importance of maximal medical management including strict control of blood pressure, blood glucose, and lipid levels, obtaining regular exercise, and cessation of smoking.  The patient is aware that without maximal medical management the underlying atherosclerotic disease process will progress, limiting the benefit of any interventions.  The patient was given information about PAD including signs, symptoms, treatment, what symptoms should prompt the patient to seek immediate medical care, and risk reduction measures to take.  Clemon Chambers, RN, MSN, FNP-C [facility] Phone: [phone]  Clinic MD: [facility]  03/07/16 10:27 AM

## 2016-03-08 ENCOUNTER — Telehealth: Payer: Self-pay | Admitting: Pulmonary Disease

## 2016-03-08 DIAGNOSIS — J449 Chronic obstructive pulmonary disease, unspecified: Secondary | ICD-10-CM

## 2016-03-08 NOTE — Telephone Encounter (Signed)
John Ball from [facility] called and states that per patient insurance in order to qualify for oxygen his blood gas PO2 reading needs to be 55 or less and patient's was 59. So he doesn't qualify. He can be reached at [phone]. -pr

## 2016-03-08 NOTE — Telephone Encounter (Signed)
Please explain to him that medicare guidelines allow for home oxygen set up with PaO2 56 to 59 mmHg if he has evidence for heart failure as demonstrate by peripheral edema (Group II coverage).    He has peripheral edema, and this is documented in my note.  This should be sufficient to qualify him for home oxygen set up.

## 2016-03-08 NOTE — Telephone Encounter (Signed)
VS---Jason called from [facility] and state that the POC in the blood gas was at 59 and it must be 55 or below for the pt to qualify for the oxygen.  Please advise. thanks

## 2016-03-08 NOTE — Telephone Encounter (Signed)
Thank you done!

## 2016-03-08 NOTE — Telephone Encounter (Signed)
Patient called upset about pt oxygen, she would like oxygen in the home today. She can be reached at [phone]

## 2016-03-08 NOTE — Addendum Note (Signed)
Addended by: Reola Calkins on: 03/08/2016 03:46 PM   Modules accepted: Orders

## 2016-03-08 NOTE — Telephone Encounter (Signed)
Called and spoke with John Ball and he is working on the order now.  He will contact the pts wife to give her updates.  She is aware.    VS I had to put in a new order for the oxygen.  Can you please sign this order. thanks

## 2016-03-08 NOTE — Telephone Encounter (Signed)
Order signed.

## 2016-04-18 DIAGNOSIS — G894 Chronic pain syndrome: Secondary | ICD-10-CM | POA: Diagnosis not present

## 2016-04-18 DIAGNOSIS — J449 Chronic obstructive pulmonary disease, unspecified: Secondary | ICD-10-CM | POA: Diagnosis not present

## 2016-04-18 DIAGNOSIS — I739 Peripheral vascular disease, unspecified: Secondary | ICD-10-CM | POA: Diagnosis not present

## 2016-04-18 DIAGNOSIS — E669 Obesity, unspecified: Secondary | ICD-10-CM | POA: Diagnosis not present

## 2016-04-18 DIAGNOSIS — Z6830 Body mass index (BMI) 30.0-30.9, adult: Secondary | ICD-10-CM | POA: Diagnosis not present

## 2016-04-18 DIAGNOSIS — E119 Type 2 diabetes mellitus without complications: Secondary | ICD-10-CM | POA: Diagnosis not present

## 2016-04-18 DIAGNOSIS — F339 Major depressive disorder, recurrent, unspecified: Secondary | ICD-10-CM | POA: Diagnosis not present

## 2016-04-18 DIAGNOSIS — I10 Essential (primary) hypertension: Secondary | ICD-10-CM | POA: Diagnosis not present

## 2016-04-18 DIAGNOSIS — I2510 Atherosclerotic heart disease of native coronary artery without angina pectoris: Secondary | ICD-10-CM | POA: Diagnosis not present

## 2016-06-10 ENCOUNTER — Ambulatory Visit (INDEPENDENT_AMBULATORY_CARE_PROVIDER_SITE_OTHER): Payer: Medicare Other | Admitting: Pulmonary Disease

## 2016-06-10 ENCOUNTER — Encounter: Payer: Self-pay | Admitting: Pulmonary Disease

## 2016-06-10 VITALS — BP 128/80 | HR 75 | Ht 72.0 in | Wt 220.4 lb

## 2016-06-10 DIAGNOSIS — J449 Chronic obstructive pulmonary disease, unspecified: Secondary | ICD-10-CM

## 2016-06-10 DIAGNOSIS — Z720 Tobacco use: Secondary | ICD-10-CM

## 2016-06-10 DIAGNOSIS — J432 Centrilobular emphysema: Secondary | ICD-10-CM

## 2016-06-10 DIAGNOSIS — J9611 Chronic respiratory failure with hypoxia: Secondary | ICD-10-CM

## 2016-06-10 NOTE — Progress Notes (Signed)
Current Outpatient Prescriptions on File Prior to Visit  Medication Sig  . albuterol (PROVENTIL HFA;VENTOLIN HFA) 108 (90 BASE) MCG/ACT inhaler Inhale 1 puff into the lungs every 6 (six) hours as needed for wheezing or shortness of breath.  Marland Kitchen albuterol (PROVENTIL) (5 MG/ML) 0.5% nebulizer solution Take 2.5 mg by nebulization every 6 (six) hours as needed for wheezing or shortness of breath.  . Cholecalciferol (VITAMIN D3) 1000 UNITS CAPS Take 1 capsule by mouth every morning.  . clonazePAM (KLONOPIN) 1 MG tablet Take 1 mg by mouth 2 (two) times daily as needed for anxiety.  . DULoxetine (CYMBALTA) 60 MG capsule Take by mouth. 60 mg tablet every morning and 30 mg tablet qhs  . Fluticasone-Salmeterol (ADVAIR) 250-50 MCG/DOSE AEPB Inhale 1 puff into the lungs 2 (two) times daily.  Marland Kitchen gabapentin (NEURONTIN) 100 MG capsule Take 100 mg by mouth 3 (three) times daily.   . nitroGLYCERIN (NITROSTAT) 0.4 MG SL tablet Place 1 tablet (0.4 mg total) under the tongue every 5 (five) minutes as needed for chest pain.  . OXYGEN Inhale into the lungs at bedtime. Uses O2 -- intolerant to CPAP  . prazosin (MINIPRESS) 1 MG capsule Take 1 mg by mouth at bedtime.  Marland Kitchen QUEtiapine (SEROQUEL XR) 300 MG 24 hr tablet Take 150 mg by mouth at bedtime.   . ramipril (ALTACE) 10 MG capsule Take 5 mg by mouth every morning.   . tiotropium (SPIRIVA) 18 MCG inhalation capsule Place 18 mcg into inhaler and inhale every morning.   . traZODone (DESYREL) 150 MG tablet Take 150 mg by mouth at bedtime.  Marland Kitchen zolpidem (AMBIEN CR) 12.5 MG CR tablet Take 12.5 mg by mouth at bedtime.     No current facility-administered medications on file prior to visit.      Chief Complaint  Patient presents with  . Follow-up    Denies any current breathing issues.     Pulmonary tests CT chest 04/20/15 >> atherosclerosis, mild centrilobular/paraseptal emphysema, s/p RLL ectomy Spirometry 03/05/16 >> FEV1 1.21 (36%), FEV1% 54% ABG 03/07/16 >> pH 7.40,  PaCO2 42.6, PaO2 59.3  Past medical history Anxiety, Depression, CAD s/p CABG, HTN, DVT, Panic attacks, HLD, Neuropathy, PTSD, Stage 1 NSCLC s/p Rt lower lobectomy 2012  Past surgical history, Family history, Social history, Allergies all reviewed.  Vital Signs BP 128/80 (BP Location: Left Arm, Cuff Size: Normal)   Pulse 75   Ht 6' (1.829 m)   Wt 220 lb 6.4 oz (100 kg)   SpO2 96%   BMI 29.89 kg/m   History of Present Illness John Ball is a 75 y.o. male smoker with COPD/emphysema/bronchitis, and chronic respiratory failure with hypoxia.  He didn't want to wait for appointment and cancelled about today.  His wife states that he will reschedule appointment.  Physical Exam Deferred.  Assessment/Plan  COPD with emphysema and chronic bronchitis. - continue advair, spiriva, prn albuterol - will need to discuss pneumonia vaccines and pulmonary rehab at next visit  Chronic respiratory failure with hypoxia. - he has PaO2 < 60 and chronic leg edema in setting of COPD - continue supplemental oxygen  Tobacco abuse. - will need to discuss smoking cessation at next visit  Patient Instructions  Patient will reschedule visit    Chesley Mires, MD [place] [facility] Pager:  [phone] 06/10/2016, 11:42 AM

## 2016-06-10 NOTE — Patient Instructions (Signed)
Patient will reschedule visit

## 2016-07-21 ENCOUNTER — Encounter (HOSPITAL_COMMUNITY): Payer: Self-pay | Admitting: *Deleted

## 2016-07-21 ENCOUNTER — Emergency Department (HOSPITAL_COMMUNITY): Payer: Medicare Other

## 2016-07-21 ENCOUNTER — Inpatient Hospital Stay (HOSPITAL_COMMUNITY)
Admission: EM | Admit: 2016-07-21 | Discharge: 2016-07-23 | DRG: 190 | Disposition: A | Discharge status: Home / Self Care | Payer: Medicare Other | Attending: Internal Medicine | Admitting: Internal Medicine

## 2016-07-21 DIAGNOSIS — F410 Panic disorder [episodic paroxysmal anxiety] without agoraphobia: Secondary | ICD-10-CM | POA: Diagnosis present

## 2016-07-21 DIAGNOSIS — R05 Cough: Secondary | ICD-10-CM | POA: Diagnosis not present

## 2016-07-21 DIAGNOSIS — I779 Disorder of arteries and arterioles, unspecified: Secondary | ICD-10-CM | POA: Diagnosis present

## 2016-07-21 DIAGNOSIS — I110 Hypertensive heart disease with heart failure: Secondary | ICD-10-CM | POA: Diagnosis present

## 2016-07-21 DIAGNOSIS — I25810 Atherosclerosis of coronary artery bypass graft(s) without angina pectoris: Secondary | ICD-10-CM

## 2016-07-21 DIAGNOSIS — F17200 Nicotine dependence, unspecified, uncomplicated: Secondary | ICD-10-CM

## 2016-07-21 DIAGNOSIS — R0602 Shortness of breath: Secondary | ICD-10-CM | POA: Diagnosis not present

## 2016-07-21 DIAGNOSIS — R7989 Other specified abnormal findings of blood chemistry: Secondary | ICD-10-CM

## 2016-07-21 DIAGNOSIS — Z951 Presence of aortocoronary bypass graft: Secondary | ICD-10-CM | POA: Diagnosis not present

## 2016-07-21 DIAGNOSIS — F411 Generalized anxiety disorder: Secondary | ICD-10-CM | POA: Diagnosis present

## 2016-07-21 DIAGNOSIS — F4310 Post-traumatic stress disorder, unspecified: Secondary | ICD-10-CM | POA: Diagnosis present

## 2016-07-21 DIAGNOSIS — I739 Peripheral vascular disease, unspecified: Secondary | ICD-10-CM | POA: Diagnosis present

## 2016-07-21 DIAGNOSIS — F17210 Nicotine dependence, cigarettes, uncomplicated: Secondary | ICD-10-CM | POA: Diagnosis present

## 2016-07-21 DIAGNOSIS — F329 Major depressive disorder, single episode, unspecified: Secondary | ICD-10-CM | POA: Diagnosis present

## 2016-07-21 DIAGNOSIS — R778 Other specified abnormalities of plasma proteins: Secondary | ICD-10-CM | POA: Diagnosis present

## 2016-07-21 DIAGNOSIS — Z9981 Dependence on supplemental oxygen: Secondary | ICD-10-CM | POA: Diagnosis not present

## 2016-07-21 DIAGNOSIS — J9621 Acute and chronic respiratory failure with hypoxia: Secondary | ICD-10-CM | POA: Diagnosis present

## 2016-07-21 DIAGNOSIS — I5032 Chronic diastolic (congestive) heart failure: Secondary | ICD-10-CM | POA: Diagnosis present

## 2016-07-21 DIAGNOSIS — Z85118 Personal history of other malignant neoplasm of bronchus and lung: Secondary | ICD-10-CM

## 2016-07-21 DIAGNOSIS — G4733 Obstructive sleep apnea (adult) (pediatric): Secondary | ICD-10-CM | POA: Diagnosis present

## 2016-07-21 DIAGNOSIS — C3491 Malignant neoplasm of unspecified part of right bronchus or lung: Secondary | ICD-10-CM | POA: Diagnosis not present

## 2016-07-21 DIAGNOSIS — H9193 Unspecified hearing loss, bilateral: Secondary | ICD-10-CM | POA: Diagnosis present

## 2016-07-21 DIAGNOSIS — C7A090 Malignant carcinoid tumor of the bronchus and lung: Secondary | ICD-10-CM | POA: Diagnosis present

## 2016-07-21 DIAGNOSIS — Z95828 Presence of other vascular implants and grafts: Secondary | ICD-10-CM

## 2016-07-21 DIAGNOSIS — I10 Essential (primary) hypertension: Secondary | ICD-10-CM | POA: Diagnosis not present

## 2016-07-21 DIAGNOSIS — C3490 Malignant neoplasm of unspecified part of unspecified bronchus or lung: Secondary | ICD-10-CM | POA: Diagnosis present

## 2016-07-21 DIAGNOSIS — Z7951 Long term (current) use of inhaled steroids: Secondary | ICD-10-CM | POA: Diagnosis not present

## 2016-07-21 DIAGNOSIS — Z720 Tobacco use: Secondary | ICD-10-CM | POA: Diagnosis present

## 2016-07-21 DIAGNOSIS — R748 Abnormal levels of other serum enzymes: Secondary | ICD-10-CM | POA: Diagnosis not present

## 2016-07-21 DIAGNOSIS — I248 Other forms of acute ischemic heart disease: Secondary | ICD-10-CM | POA: Diagnosis present

## 2016-07-21 DIAGNOSIS — F339 Major depressive disorder, recurrent, unspecified: Secondary | ICD-10-CM | POA: Diagnosis present

## 2016-07-21 DIAGNOSIS — G629 Polyneuropathy, unspecified: Secondary | ICD-10-CM | POA: Diagnosis present

## 2016-07-21 DIAGNOSIS — Z86718 Personal history of other venous thrombosis and embolism: Secondary | ICD-10-CM | POA: Diagnosis not present

## 2016-07-21 DIAGNOSIS — I25709 Atherosclerosis of coronary artery bypass graft(s), unspecified, with unspecified angina pectoris: Secondary | ICD-10-CM | POA: Diagnosis present

## 2016-07-21 DIAGNOSIS — J441 Chronic obstructive pulmonary disease with (acute) exacerbation: Secondary | ICD-10-CM | POA: Diagnosis present

## 2016-07-21 DIAGNOSIS — Z886 Allergy status to analgesic agent status: Secondary | ICD-10-CM | POA: Diagnosis not present

## 2016-07-21 DIAGNOSIS — Z888 Allergy status to other drugs, medicaments and biological substances status: Secondary | ICD-10-CM

## 2016-07-21 DIAGNOSIS — I2510 Atherosclerotic heart disease of native coronary artery without angina pectoris: Secondary | ICD-10-CM | POA: Diagnosis present

## 2016-07-21 LAB — CBC
HCT: 47.1 % (ref 39.0–52.0)
HEMOGLOBIN: 15.5 g/dL (ref 13.0–17.0)
MCH: 33.3 pg (ref 26.0–34.0)
MCHC: 32.9 g/dL (ref 30.0–36.0)
MCV: 101.1 fL — AB (ref 78.0–100.0)
Platelets: 183 10*3/uL (ref 150–400)
RBC: 4.66 MIL/uL (ref 4.22–5.81)
RDW: 14.1 % (ref 11.5–15.5)
WBC: 7.5 10*3/uL (ref 4.0–10.5)

## 2016-07-21 LAB — BASIC METABOLIC PANEL
ANION GAP: 10 (ref 5–15)
BUN: 14 mg/dL (ref 6–20)
CALCIUM: 9.8 mg/dL (ref 8.9–10.3)
CO2: 27 mmol/L (ref 22–32)
CREATININE: 0.98 mg/dL (ref 0.61–1.24)
Chloride: 105 mmol/L (ref 101–111)
GFR calc Af Amer: >60 mL/min (ref >60)
GFR calc non Af Amer: >60 mL/min (ref >60)
GLUCOSE: 119 mg/dL — AB (ref 65–99)
Potassium: 4.5 mmol/L (ref 3.5–5.1)
Sodium: 142 mmol/L (ref 135–145)

## 2016-07-21 LAB — I-STAT ARTERIAL BLOOD GAS, ED
Acid-Base Excess: 3 mmol/L — ABNORMAL HIGH (ref 0.0–2.0)
Bicarbonate: 30.1 mmol/L — ABNORMAL HIGH (ref 20.0–28.0)
O2 SAT: 100 %
PCO2 ART: 55.4 mmHg — AB (ref 32.0–48.0)
PH ART: 7.344 — AB (ref 7.350–7.450)
PO2 ART: 183 mmHg — AB (ref 83.0–108.0)
Patient temperature: 98.8
TCO2: 32 mmol/L (ref 0–100)

## 2016-07-21 LAB — INFLUENZA PANEL BY PCR (TYPE A & B)
INFLBPCR: NEGATIVE
Influenza A By PCR: NEGATIVE

## 2016-07-21 LAB — BRAIN NATRIURETIC PEPTIDE: B Natriuretic Peptide: 43.1 pg/mL (ref 0.0–100.0)

## 2016-07-21 LAB — TROPONIN I: TROPONIN I: 0.03 ng/mL — AB (ref <0.03)

## 2016-07-21 MED ORDER — DULOXETINE HCL 60 MG PO CPEP
60.0000 mg | ORAL_CAPSULE | Freq: Every day | ORAL | Status: DC
  Administered 2016-07-22 – 2016-07-23 (×2): 60 mg via ORAL
  Filled 2016-07-21 (×2): qty 1

## 2016-07-21 MED ORDER — ONDANSETRON HCL 4 MG/2ML IJ SOLN: 4.0000 mg | Freq: Three times a day (TID) | INTRAMUSCULAR | Status: DC | PRN

## 2016-07-21 MED ORDER — IPRATROPIUM-ALBUTEROL 0.5-2.5 (3) MG/3ML IN SOLN
3.0000 mL | RESPIRATORY_TRACT | Status: DC
  Administered 2016-07-22 (×2): 3 mL via RESPIRATORY_TRACT
  Filled 2016-07-21 (×2): qty 3

## 2016-07-21 MED ORDER — NICOTINE 21 MG/24HR TD PT24
21.0000 mg | MEDICATED_PATCH | Freq: Every day | TRANSDERMAL | Status: DC
  Administered 2016-07-22 – 2016-07-23 (×2): 21 mg via TRANSDERMAL
  Filled 2016-07-21 (×2): qty 1

## 2016-07-21 MED ORDER — AZITHROMYCIN 500 MG PO TABS
500.0000 mg | ORAL_TABLET | Freq: Every evening | ORAL | Status: AC
  Administered 2016-07-22: 500 mg via ORAL
  Filled 2016-07-21: qty 1

## 2016-07-21 MED ORDER — METHYLPREDNISOLONE SODIUM SUCC 125 MG IJ SOLR
60.0000 mg | Freq: Two times a day (BID) | INTRAMUSCULAR | Status: DC
  Administered 2016-07-22 – 2016-07-23 (×3): 60 mg via INTRAVENOUS
  Filled 2016-07-21 (×3): qty 2

## 2016-07-21 MED ORDER — GABAPENTIN 100 MG PO CAPS
100.0000 mg | ORAL_CAPSULE | Freq: Every day | ORAL | Status: DC
  Administered 2016-07-22 (×2): 100 mg via ORAL
  Filled 2016-07-21 (×2): qty 1

## 2016-07-21 MED ORDER — TRAZODONE HCL 100 MG PO TABS
150.0000 mg | ORAL_TABLET | Freq: Every day | ORAL | Status: DC
  Administered 2016-07-22 (×2): 150 mg via ORAL
  Filled 2016-07-21 (×2): qty 1

## 2016-07-21 MED ORDER — FUROSEMIDE 10 MG/ML IJ SOLN
60.0000 mg | Freq: Once | INTRAMUSCULAR | Status: AC
  Administered 2016-07-21: 60 mg via INTRAVENOUS
  Filled 2016-07-21: qty 6

## 2016-07-21 MED ORDER — ENOXAPARIN SODIUM 40 MG/0.4ML ~~LOC~~ SOLN
40.0000 mg | SUBCUTANEOUS | Status: DC
  Administered 2016-07-22 – 2016-07-23 (×2): 40 mg via SUBCUTANEOUS
  Filled 2016-07-21 (×2): qty 0.4

## 2016-07-21 MED ORDER — DM-GUAIFENESIN ER 30-600 MG PO TB12
1.0000 | ORAL_TABLET | Freq: Two times a day (BID) | ORAL | Status: DC
  Administered 2016-07-22 – 2016-07-23 (×4): 1 via ORAL
  Filled 2016-07-21 (×4): qty 1

## 2016-07-21 MED ORDER — PRAZOSIN HCL 1 MG PO CAPS
1.0000 mg | ORAL_CAPSULE | Freq: Every day | ORAL | Status: DC
  Administered 2016-07-22 (×2): 1 mg via ORAL
  Filled 2016-07-21 (×2): qty 1

## 2016-07-21 MED ORDER — DULOXETINE HCL 30 MG PO CPEP
30.0000 mg | ORAL_CAPSULE | Freq: Every day | ORAL | Status: DC
  Administered 2016-07-22: 30 mg via ORAL
  Filled 2016-07-21 (×2): qty 1

## 2016-07-21 MED ORDER — CLONAZEPAM 1 MG PO TABS
1.0000 mg | ORAL_TABLET | Freq: Two times a day (BID) | ORAL | Status: DC | PRN
  Administered 2016-07-22 (×2): 1 mg via ORAL
  Filled 2016-07-21 (×2): qty 1

## 2016-07-21 MED ORDER — ZOLPIDEM TARTRATE 5 MG PO TABS
5.0000 mg | ORAL_TABLET | Freq: Every evening | ORAL | Status: DC | PRN
  Administered 2016-07-22: 5 mg via ORAL
  Filled 2016-07-21: qty 1

## 2016-07-21 MED ORDER — QUETIAPINE FUMARATE ER 300 MG PO TB24
300.0000 mg | ORAL_TABLET | Freq: Every day | ORAL | Status: DC
  Administered 2016-07-22 (×2): 300 mg via ORAL
  Filled 2016-07-21 (×2): qty 1

## 2016-07-21 MED ORDER — LEVOFLOXACIN IN D5W 750 MG/150ML IV SOLN
750.0000 mg | Freq: Once | INTRAVENOUS | Status: AC
  Administered 2016-07-21: 750 mg via INTRAVENOUS
  Filled 2016-07-21: qty 150

## 2016-07-21 MED ORDER — ALBUTEROL SULFATE (2.5 MG/3ML) 0.083% IN NEBU
5.0000 mg | INHALATION_SOLUTION | Freq: Once | RESPIRATORY_TRACT | Status: AC
  Administered 2016-07-21: 5 mg via RESPIRATORY_TRACT
  Filled 2016-07-21: qty 6

## 2016-07-21 MED ORDER — RAMIPRIL 2.5 MG PO CAPS
5.0000 mg | ORAL_CAPSULE | Freq: Every morning | ORAL | Status: DC
  Administered 2016-07-22 – 2016-07-23 (×2): 5 mg via ORAL
  Filled 2016-07-21 (×2): qty 2

## 2016-07-21 MED ORDER — ALBUTEROL SULFATE (2.5 MG/3ML) 0.083% IN NEBU: 5.0000 mg | INHALATION_SOLUTION | RESPIRATORY_TRACT | Status: DC | PRN

## 2016-07-21 MED ORDER — VITAMIN D 1000 UNITS PO TABS
1000.0000 [IU] | ORAL_TABLET | Freq: Every morning | ORAL | Status: DC
  Administered 2016-07-22 – 2016-07-23 (×2): 1000 [IU] via ORAL
  Filled 2016-07-21 (×2): qty 1

## 2016-07-21 MED ORDER — METHYLPREDNISOLONE SODIUM SUCC 125 MG IJ SOLR
125.0000 mg | Freq: Once | INTRAMUSCULAR | Status: AC
  Administered 2016-07-21: 125 mg via INTRAVENOUS
  Filled 2016-07-21: qty 2

## 2016-07-21 MED ORDER — AZITHROMYCIN 500 MG PO TABS: 250.0000 mg | ORAL_TABLET | Freq: Every day | ORAL | Status: DC

## 2016-07-21 MED ORDER — NITROGLYCERIN 0.4 MG SL SUBL: 0.4000 mg | SUBLINGUAL_TABLET | SUBLINGUAL | Status: DC | PRN

## 2016-07-21 NOTE — H&P (Signed)
History and Physical    John Ball ZJQ:[id number] DOB: [birth year] DOA: 07/21/2016  Referring MD/NP/PA:   PCP: Leeroy Cha, MD   Patient coming from:  The patient is coming from home.  At baseline, pt is independent for most of ADL.   Chief Complaint: Shortness of breath and cough  HPI: John Ball is a 76 y.o. male with medical history significant of Hypertension, COPD, on 2 L oxygen at home, depression, anxiety, tobacco abuse, CAD, S/P of CABG, PTSD, OSA not on CPAP, PVD, DVT, CHF (no 2-D echo on record), severe hearing loss bilaterally, squamous cell lung cancer (stage IA, s/p of right lobectomy, no further treatment needed), who presents with shortness breath and cough.  Patient states that he has been having shortness of breath for almost a week. He has cough with clear mucus production. He also has wheezing. Denies fever, chills, chest pain, runny nose or sore throat. Patient does not have a nausea, vomiting, diarrhea, abdominal pain, symptoms of UTI or unilateral weakness. No leg edema.  ED Course: pt was found to have BNP 43.1, positive troponin 0.03, WBC 7.5, electrolytes renal function okay, temperature normal, tachycardia, oxygen saturation 92% on room air, negative chest x-ray for acute abnormalities, flu PCR negative. ABG with pH of 7.344, PCO2 55 and PO2 183. Patient is admitted to telemetry bed as inpatient.  Review of Systems:   General: no fevers, chills, no changes in body weight, has fatigue HEENT: no blurry vision, hearing changes or sore throat Respiratory: has dyspnea, coughing, wheezing CV: no chest pain, no palpitations GI: no nausea, vomiting, abdominal pain, diarrhea, constipation GU: no dysuria, burning on urination, increased urinary frequency, hematuria  Ext: no leg edema Neuro: no unilateral weakness, numbness, or tingling, no vision change or hearing loss Skin: no rash, no skin tear. MSK: No muscle spasm, no deformity, no limitation of  range of movement in spin Heme: No easy bruising.  Travel history: No recent long distant travel.  Allergy:  Allergies  Allergen Reactions  . Aripiprazole Other (See Comments)    "makes him wild"  . Aspirin Other (See Comments)    Avoids -- bruises real bad  . Crestor [Rosuvastatin Calcium] Other (See Comments)    myalgia  . Effexor [Venlafaxine Hydrochloride] Itching  . Lipitor [Atorvastatin Calcium] Other (See Comments)    myalgia  . Morphine And Related Itching  . Zetia [Ezetimibe] Other (See Comments)    myalgia  . Zocor [Simvastatin - High Dose] Other (See Comments)    myalgia    Past Medical History:  Diagnosis Date  . Anxiety   . Arthritis   . CAD (coronary artery disease)    cardiologist-  dr kelly/ dr berry  ----  s/p  cabg x4 in 2004  w/ severe LM disease  . Complication of anesthesia    hx prolonged post op oxygen dependent/  hx hallucinations for 2 day post op lobectomy 2012  . COPD, severe ([place])   . Depression   . Dyspnea on exertion    CHRONIC  . Essential hypertension   . Hearing loss of both ears    does wear his hearing aids  . History of arterial bypass of lower extremity    RIGHT FEM-POP  . History of CHF (congestive heart failure)   . History of DVT of lower extremity   . History of panic attacks   . Hyperlipidemia with target LDL less than 70   . Left main coronary artery disease Jan 2004  a) Abn Myoview (RCA Ischemia) --> CATH: Ostial LM 60-70%, 50% mCx, 60-70% mid RI, 70-90-75% m-d RCA w/ 95% PLS --> CABG; b) Echo 8/'07: Normal LV thickness and function, mild LV dilation. Essentially normal echo  . Neuropathy, peripheral ([place])   . 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 Nocturia   . OSA (obstructive sleep apnea)    uses O2  via [place]  --  intolerate to CPAP  . Peripheral arterial occlusive disease ([place]) followed by dr Early--  per last dopplers 06-28-2014 no change right graft,  50-74% stenosis common and left mid superficial femoral  artery   2011   a) Gore-Tex graft right AK popA-BK popA --> b) 2012: thrombosed graft --> thrombectomy with dacron patch = 1 V runnof via Peroneal; c) 01/2014: R femoropopliteal bypass with left femoral vein  . PTSD (post-traumatic stress disorder)    anxiety attack's---  [place] [employer]  . Recurrent productive cough    SMOKER'S COUGH  . 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 Diaphragmatic attenuation  . Smoker unmotivated to quit    Reportedly he came close to having a nervous breakdown when he last tried to quit  . Squamous cell lung cancer ([place]) 2012-2014   (oncologist at [facility] -- dr Bretta Bang--  no recurrence , continued observation   Stage IA  Non-small cell--- a) 04/2011: R L Lobectomy & med node dissection ([id number]) - [facility] [place], [place]) w/o post-op Rx; b) CT-A Chest Jan 2013: R pl effusion, R hilar LAN (3.3 cm x 2.8 cm & 2.2 cm x 1.3 cm); c) 2/'13 PET-CT Chest: Bilat Hilar LAN, no distant Mets  d) CT chest 9/'14: Stable shotty hilar nodes bilaterally w/ no pathologic sized LAD or suspicious Pulm nodule;   . Wears glasses     Past Surgical History:  Procedure Laterality Date  . CARDIAC CATHETERIZATION  07-13-2002   Ischemia RCA region on Cardiolite// 60-70% ostial left main, 50% midCx, 60-70% mid RI,  70% mRCA, 90% JeNu and crux 75% RCA ,  95% PLA//  severe LM and 3Vessel CAD, perserved LV, ef 60%  . CARDIOVASCULAR STRESS TEST  last one  06-17-2014   dr  Ellyn Hack   [place] Adam study with no exercise; Intermediate Risk Study;   moderate size and intensity, partially reversible inferior septal defect consistent with prior infarct and mild to moderate peri-infarct ischemia;  mild hypokinesis of the inferior septal wall,  normal LV function, ef 56%  . COLONOSCOPY  2008    WNL  . CORONARY ARTERY BYPASS GRAFT  07-27-2002  dr Malvin Johns, freeRIMA-RI,SVG-OM2, SVG-rPDA  . CYSTOSCOPY W/ URETERAL  STENT PLACEMENT N/A 02/21/2015   Procedure: BILATERAL RETROGRADE PYELOGRAM;  Surgeon: Festus Aloe, MD;  Location: [facility];  Service: Urology;  Laterality: N/A;  . EYE SURGERY Left Nov 2013  . FEMORAL ARTERY - POPLITEAL ARTERY BYPASS GRAFT Right 10-24-2008   dr early   w/ GoreTex graft  . FEMORAL-TIBIAL BYPASS GRAFT Right 01/10/2014   Procedure: Right Femoral to Posterior Tibial Bypass Graft using Left Leg Vein, Thrombectomy Right Common Femoral and Profunda of Leg . ;  Surgeon: Rosetta Posner, MD;  Location: [facility];  Service: Vascular;  Laterality: Right;  . HYDROCELE EXCISION Left   . LEFT ANKLE SURGERY  1990's  . LEFT HEART CATHETERIZATION WITH CORONARY/GRAFT ANGIOGRAM N/A 07/12/2014  Procedure: LEFT HEART CATHETERIZATION WITH Beatrix Fetters;  Surgeon: Troy Sine, MD;  Location: [facility] CATH [facility];  Service: Cardiovascular;  Laterality: N/A;  mild LV dysfunction,  ef 45-50% , mild inferior hypocontractility; sig. native coronary obstuctive disease with left to right predominant septal collateralization;  occluded SVG to RCA graft , other graft's patent  . RIGHT ABOVE KNEE POPLITEAL GRAFT TO BELOW KNEE POPLITEAL ARTERY BYPASS WITH REVERSE SAPHENOUS VEIN  03-02-2010  . THROMBECTOMY OF RIGHT FEMORAL TO ABOVE KNEE POPLITEAL GORETEX GRAFT ANGIOPLASTY OF GORETEX AND SAPHENOUS VEIN JUNCTION AND ABOVE KNEE POPLITEAL ARTERY   10-06-2010   DR EARLY  . TONSILLECTOMY    . TRANSURETHRAL RESECTION OF BLADDER TUMOR N/A 02/21/2015   Procedure: TRANSURETHRAL RESECTION OF BLADDER TUMOR (TURBT);  Surgeon: Festus Aloe, MD;  Location[facility];  Service: Urology;  Laterality: N/A;  . VIDEO ASSISTED THORACOSCOPY (VATS)/WEDGE RESECTION Right 05-01-2011    FORSYTH   LOWER LOBECTOMY W/  NODE DISSECTION    Social History:  reports that he has been smoking Cigarettes.  He has a 27.50 pack-year smoking history. He has never used smokeless tobacco. He reports that he does not  drink alcohol or use drugs.  Family History:  Family History  Problem Relation Age of Onset  . Other Mother 23    Abdominal Aortic Aneurysm   . Stroke Mother   . Alzheimer's disease Father 66  . Stroke Father      Prior to Admission medications   Medication Sig Start Date End Date Taking? Authorizing Provider  albuterol (PROVENTIL HFA;VENTOLIN HFA) 108 (90 BASE) MCG/ACT inhaler Inhale 1 puff into the lungs every 6 (six) hours as needed for wheezing or shortness of breath.   Yes Historical Provider, MD  albuterol (PROVENTIL) (5 MG/ML) 0.5% nebulizer solution Take 2.5 mg by nebulization every 6 (six) hours as needed for wheezing or shortness of breath.   Yes Historical Provider, MD  budesonide-formoterol (SYMBICORT) 160-4.5 MCG/ACT inhaler Inhale 2 puffs into the lungs daily as needed. For shortness of breath   Yes Historical Provider, MD  Cholecalciferol (VITAMIN D3) 1000 UNITS CAPS Take 1 capsule by mouth every morning.   Yes Historical Provider, MD  clonazePAM (KLONOPIN) 1 MG tablet Take 1 mg by mouth 2 (two) times daily as needed for anxiety.   Yes Historical Provider, MD  DULoxetine (CYMBALTA) 60 MG capsule Take by mouth. 60 mg tablet every morning and 30 mg tablet qhs   Yes Historical Provider, MD  Fluticasone-Salmeterol (ADVAIR) 250-50 MCG/DOSE AEPB Inhale 1 puff into the lungs 2 (two) times daily.   Yes Historical Provider, MD  gabapentin (NEURONTIN) 100 MG capsule Take 100 mg by mouth at bedtime.    Yes Historical Provider, MD  OXYGEN Inhale into the lungs at bedtime. Uses O2 -- intolerant to CPAP   Yes Historical Provider, MD  prazosin (MINIPRESS) 1 MG capsule Take 1 mg by mouth at bedtime.   Yes Historical Provider, MD  QUEtiapine (SEROQUEL XR) 300 MG 24 hr tablet Take 300 mg by mouth at bedtime.    Yes Historical Provider, MD  ramipril (ALTACE) 10 MG capsule Take 5 mg by mouth every morning.    Yes Historical Provider, MD  tiotropium (SPIRIVA) 18 MCG inhalation capsule Place 18  mcg into inhaler and inhale every morning.    Yes Historical Provider, MD  traZODone (DESYREL) 150 MG tablet Take 150 mg by mouth at bedtime.   Yes Historical Provider, MD  zolpidem (AMBIEN CR) 12.5 MG CR  tablet Take 12.5 mg by mouth at bedtime.     Yes Historical Provider, MD  nitroGLYCERIN (NITROSTAT) 0.4 MG SL tablet Place 1 tablet (0.4 mg total) under the tongue every 5 (five) minutes as needed for chest pain. 06/06/14   Leonie Man, MD    Physical Exam: Vitals:   07/21/16 2315 07/22/16 0105 07/22/16 0432 07/22/16 0440  BP:    102/71  Pulse:  (!) 109 (!) 106 (!) 102  Resp:  '20 20 18  '$ Temp:    97.8 F (36.6 C)  TempSrc:    Oral  SpO2:  95% 96% 96%  Weight: 95 kg (209 lb 6.4 oz)     Height:       General: Not in acute distress HEENT:       Eyes: PERRL, EOMI, no scleral icterus.       ENT: No discharge from the ears and nose, no pharynx injection, no tonsillar enlargement.        Neck: No JVD, no bruit, no mass felt. Heme: No neck lymph node enlargement. Cardiac: S1/S2, RRR, No murmurs, No gallops or rubs. Respiratory: has decreased air movement and wheezing bilaterally. GI: Soft, nondistended, nontender, no rebound pain, no organomegaly, BS present. GU: No hematuria Ext: No pitting leg edema bilaterally. 2+DP/PT pulse bilaterally. Musculoskeletal: No joint deformities, No joint redness or warmth, no limitation of ROM in spin. Skin: No rashes.  Neuro: Alert, oriented X3, cranial nerves II-XII grossly intact, moves all extremities normally.  Psych: Patient is not psychotic, no suicidal or hemocidal ideation.  Labs on Admission: I have personally reviewed following labs and imaging studies  CBC:  Recent Labs Lab 07/21/16 1615  WBC 7.5  HGB 15.5  HCT 47.1  MCV 101.1*  PLT 818   Basic Metabolic Panel:  Recent Labs Lab 07/21/16 1615  NA 142  K 4.5  CL 105  CO2 27  GLUCOSE 119*  BUN 14  CREATININE 0.98  CALCIUM 9.8   GFR: Estimated Creatinine Clearance:  80.3 mL/min (by C-G formula based on SCr of 0.98 mg/dL). Liver Function Tests: No results for input(s): AST, ALT, ALKPHOS, BILITOT, PROT, ALBUMIN in the last 168 hours. No results for input(s): LIPASE, AMYLASE in the last 168 hours. No results for input(s): AMMONIA in the last 168 hours. Coagulation Profile: No results for input(s): INR, PROTIME in the last 168 hours. Cardiac Enzymes:  Recent Labs Lab 07/21/16 1615 07/21/16 2324 07/22/16 0456  TROPONINI 0.03* <0.03 <0.03   BNP (last 3 results) No results for input(s): PROBNP in the last 8760 hours. HbA1C: No results for input(s): HGBA1C in the last 72 hours. CBG: No results for input(s): GLUCAP in the last 168 hours. Lipid Profile:  Recent Labs  07/22/16 0456  CHOL 186  HDL 38*  LDLCALC 132*  TRIG 78  CHOLHDL 4.9   Thyroid Function Tests: No results for input(s): TSH, T4TOTAL, FREET4, T3FREE, THYROIDAB in the last 72 hours. Anemia Panel: No results for input(s): VITAMINB12, FOLATE, FERRITIN, TIBC, IRON, RETICCTPCT in the last 72 hours. Urine analysis:    Component Value Date/Time   COLORURINE YELLOW 02/23/2010 1546   APPEARANCEUR CLEAR 02/23/2010 1546   LABSPEC 1.015 02/23/2010 1546   PHURINE 5.0 02/23/2010 1546   GLUCOSEU NEGATIVE 02/23/2010 1546   HGBUR NEGATIVE 02/23/2010 1546   BILIRUBINUR NEGATIVE 02/23/2010 1546   KETONESUR NEGATIVE 02/23/2010 1546   PROTEINUR NEGATIVE 02/23/2010 1546   UROBILINOGEN 0.2 02/23/2010 1546   NITRITE NEGATIVE 02/23/2010 1546   LEUKOCYTESUR  02/23/2010 1546    NEGATIVE MICROSCOPIC NOT DONE ON URINES WITH NEGATIVE PROTEIN, BLOOD, LEUKOCYTES, NITRITE, OR GLUCOSE <1000 mg/dL.   Sepsis Labs: '@LABRCNTIP'$ (procalcitonin:4,lacticidven:4) )No results found for this or any previous visit (from the past 240 hour(s)).   Radiological Exams on Admission: Dg Chest 2 View  Result Date: 07/21/2016 CLINICAL DATA:  SOB, "heaviness" over entire chest and productive cough x 1 week. (white  phlegm EXAM: CHEST  2 VIEW COMPARISON:  09/25/2015 FINDINGS: Changes from CABG surgery are stable. The cardiac silhouette is normal in size and configuration. No mediastinal or hilar masses. No convincing adenopathy. Moderate right and small left pleural effusions, stable. Lungs show prominent bronchovascular and interstitial markings, also stable. No lung consolidation. No pneumothorax. Skeletal structures are demineralized but grossly intact. IMPRESSION: 1. No significant change from the prior study. 2. Moderate right and small left pleural effusions. Bronchovascular and interstitial prominence without overt pulmonary edema. No convincing pneumonia. Electronically Signed   By: Lajean Manes M.D.   On: 07/21/2016 17:01     EKG: Independently reviewed. Sinus rhythm, QTC 457, PA-C, mild T-wave inversion in V5 to V6, and in inferior leads.  Assessment/Plan Principal Problem:   Acute on chronic respiratory failure with hypoxia (HCC) Active Problems:   Squamous cell lung cancer (HCC)   S/P CABG x 4   Peripheral arterial occlusive disease ([place])   Smoker unmotivated to quit   Essential hypertension   Coronary artery disease involving coronary bypass graft of native heart without angina pectoris   COPD exacerbation (HCC)   CHF (congestive heart failure) (HCC)   Depression   Elevated troponin   Acute on chronic respiratory failure with hypoxia due to COPD exacerbation: Patient has a shortness breath, wheezing, cough, CXR with no infiltration, consistent with COPD exacerbation. Patient is euvolemic on physical examination, less likely to have CHF exacerbation.  -will admit patient to telemetry bed as inpt -Nebulizers: scheduled Duoneb and prn albuterol -Solu-Medrol 60 mg IV bid -Oral azithromycin for 5 days.  -Mucinex for cough  -Urine drug screen, HIV -Urine S. pneumococcal antigen -Follow up blood culture x2, sputum culture  Positive trop and hx of CAD: s/p of CABG. Trop was initially  positive 0.03, but becomes negative on the repeated tests. No CP, likely due to mild demand ischemia. -When necessary nitroglycerin -Patient is allergic to aspirin -Check A1c, FLP  Tobacco abuse: -Did counseling about importance of quitting smoking -Nicotine patch  CHF (congestive heart failure): No 2 echo on record. BNP 43.1. Pt does not have leg edema or JVD. CHF is compensated. Patient received one dose of Lasix 60 mg 1 in ED -watch volume status closely  Essential hypertension: -continue ramipril -Patient is also on prazosin  Depression and anxiety: Stable, no suicidal or homicidal ideations. -Continue home medications: Cymbalta and Klonopin   DVT ppx: SQ Lovenox Code Status: Full code Family Communication: Yes, patient's wife at bed side Disposition Plan:  Anticipate discharge back to previous home environment Consults called:  none Admission status: Inpatient/tele    Date of Service 07/22/2016    Ivor Costa [facility] Pager [phone]  If 7PM-7AM, please contact night-coverage [email or web address] Password [facility] 07/22/2016, 5:55 AM

## 2016-07-21 NOTE — ED Notes (Signed)
Attempted to call report

## 2016-07-21 NOTE — ED Triage Notes (Signed)
The pt has had sob for over a week   hes oln home 02  Not on him now.    No pain

## 2016-07-21 NOTE — ED Notes (Signed)
MD at bedside. 

## 2016-07-21 NOTE — ED Notes (Signed)
Pt and family member asking over and over about test results and plan of care for patient. MD has been notified

## 2016-07-21 NOTE — ED Provider Notes (Signed)
[facility] DEPT Provider Note   CSN: [id number] Arrival date & time: 07/21/16  9935     History   Chief Complaint Chief Complaint  Patient presents with  . Shortness of Breath    HPI John Ball is a 76 y.o. male.  HPI   77 year old man history of COPD and congestive heart failure comes in today complaining of increasing shortness breath over the past week. Have cough productive of discolored sputum. He has had some subjective chills but no fevers. He has had some increased edema. Denies any chest pain, nausea, vomiting, or diarrhea.  Past Medical History:  Diagnosis Date  . Anxiety   . Arthritis   . CAD (coronary artery disease)    cardiologist-  dr kelly/ dr berry  ----  s/p  cabg x4 in 2004  w/ severe LM disease  . Complication of anesthesia    hx prolonged post op oxygen dependent/  hx hallucinations for 2 day post op lobectomy 2012  . COPD, severe ([place])   . Depression   . Dyspnea on exertion    CHRONIC  . Essential hypertension   . Hearing loss of both ears    does wear his hearing aids  . History of arterial bypass of lower extremity    RIGHT FEM-POP  . History of CHF (congestive heart failure)   . History of DVT of lower extremity   . History of panic attacks   . Hyperlipidemia with target LDL less than 70   . Left main coronary artery disease Jan 2004   a) Abn Myoview (RCA Ischemia) --> CATH: Ostial LM 60-70%, 50% mCx, 60-70% mid RI, 70-90-75% m-d RCA w/ 95% PLS --> CABG; b) Echo 8/'07: Normal LV thickness and function, mild LV dilation. Essentially normal echo  . 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 Nocturia   . OSA (obstructive sleep apnea)    uses O2  via [place]  --  intolerate to CPAP  . Peripheral arterial occlusive disease ([place]) followed by dr Early--  per last dopplers 06-28-2014 no change right graft,  50-74% stenosis common and left mid superficial femoral artery   2011   a) Gore-Tex graft right AK  popA-BK popA --> b) 2012: thrombosed graft --> thrombectomy with dacron patch = 1 V runnof via Peroneal; c) 01/2014: R femoropopliteal bypass with left femoral vein  . PTSD (post-traumatic stress disorder)    anxiety attack's---  [place] [employer]  . Recurrent productive cough    SMOKER'S COUGH  . 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 Diaphragmatic attenuation  . Smoker unmotivated to quit    Reportedly he came close to having a nervous breakdown when he last tried to quit  . 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 Wears glasses  Patient Active Problem List   Diagnosis Date Noted  . Preoperative cardiovascular examination 01/28/2015  . Abnormal stress test   . Coronary artery disease involving coronary bypass graft of native heart without angina pectoris   . Chest pain at rest 06/06/2014  . Peripheral arterial occlusive disease ([place])   . Hyperlipidemia with target LDL less than 70   . Smoker unmotivated to quit   . Essential hypertension   . COPD (chronic obstructive pulmonary disease) ([place])   . Pain of left lower extremity 02/01/2014  . Squamous cell lung cancer ([place]) 05/05/2011  . Left main coronary artery disease 07/08/2002  . 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/2002    Past Surgical History:  Procedure Laterality Date  . CARDIAC CATHETERIZATION  07-13-2002   Ischemia RCA region on Cardiolite// 60-70% ostial left main, 50% midCx, 60-70% mid RI,   70% mRCA, 90% JeNu and crux 75% RCA ,  95% PLA//  severe LM and 3Vessel CAD, perserved LV, ef 60%  . CARDIOVASCULAR STRESS TEST  last one  06-17-2014   dr  Ellyn Hack   Carlton Adam study with no exercise; Intermediate Risk Study;   moderate size and intensity, partially reversible inferior septal defect consistent with prior infarct and mild to moderate peri-infarct ischemia;  mild hypokinesis of the inferior septal wall,  normal LV function, ef 56%  . COLONOSCOPY  2008    WNL  . CORONARY ARTERY BYPASS GRAFT  07-27-2002  dr Malvin Johns, freeRIMA-RI,SVG-OM2, SVG-rPDA  . CYSTOSCOPY W/ URETERAL STENT PLACEMENT N/A 02/21/2015   Procedure: BILATERAL RETROGRADE PYELOGRAM;  Surgeon: Festus Aloe, MD;  Location: [facility];  Service: Urology;  Laterality: N/A;  . EYE SURGERY Left Nov 2013  . FEMORAL ARTERY - POPLITEAL ARTERY BYPASS GRAFT Right 10-24-2008   dr early   w/ GoreTex graft  . FEMORAL-TIBIAL BYPASS GRAFT Right 01/10/2014   Procedure: Right Femoral to Posterior Tibial Bypass Graft using Left Leg Vein, Thrombectomy Right Common Femoral and Profunda of Leg . ;  Surgeon: Rosetta Posner, MD;  Location: [facility];  Service: Vascular;  Laterality: Right;  . HYDROCELE EXCISION Left   . LEFT ANKLE SURGERY  1990's  . LEFT HEART CATHETERIZATION WITH CORONARY/GRAFT ANGIOGRAM N/A 07/12/2014   Procedure: LEFT HEART CATHETERIZATION WITH Beatrix Fetters;  Surgeon: Troy Sine, MD;  Location: [facility] CATH LAB;  Service: Cardiovascular;  Laterality: N/A;  mild LV dysfunction,  ef 45-50% , mild inferior hypocontractility; sig. native coronary obstuctive disease with left to right predominant septal collateralization;  occluded SVG to RCA graft , other graft's patent  . RIGHT ABOVE KNEE POPLITEAL GRAFT TO BELOW KNEE POPLITEAL ARTERY BYPASS WITH REVERSE SAPHENOUS VEIN  03-02-2010  . THROMBECTOMY OF RIGHT FEMORAL TO ABOVE KNEE POPLITEAL GORETEX GRAFT ANGIOPLASTY OF GORETEX AND SAPHENOUS VEIN  JUNCTION AND ABOVE KNEE POPLITEAL ARTERY   10-06-2010   DR EARLY  . TONSILLECTOMY    . TRANSURETHRAL RESECTION OF BLADDER TUMOR N/A 02/21/2015   Procedure: TRANSURETHRAL RESECTION OF BLADDER TUMOR (TURBT);  Surgeon: Festus Aloe, MD;  [facility];  Service: Urology;  Laterality: N/A;  . VIDEO ASSISTED THORACOSCOPY (VATS)/WEDGE RESECTION Right 05-01-2011    FORSYTH   LOWER LOBECTOMY W/  NODE DISSECTION       Home Medications    Prior to Admission medications   Medication Sig Start Date End Date Taking? Authorizing Provider  albuterol (PROVENTIL HFA;VENTOLIN HFA) 108 (90 BASE) MCG/ACT inhaler Inhale 1 puff into the lungs every 6 (six)  hours as needed for wheezing or shortness of breath.   Yes Historical Provider, MD  albuterol (PROVENTIL) (5 MG/ML) 0.5% nebulizer solution Take 2.5 mg by nebulization every 6 (six) hours as needed for wheezing or shortness of breath.   Yes Historical Provider, MD  budesonide-formoterol (SYMBICORT) 160-4.5 MCG/ACT inhaler Inhale 2 puffs into the lungs daily as needed. For shortness of breath   Yes Historical Provider, MD  Cholecalciferol (VITAMIN D3) 1000 UNITS CAPS Take 1 capsule by mouth every morning.   Yes Historical Provider, MD  clonazePAM (KLONOPIN) 1 MG tablet Take 1 mg by mouth 2 (two) times daily as needed for anxiety.   Yes Historical Provider, MD  DULoxetine (CYMBALTA) 60 MG capsule Take by mouth. 60 mg tablet every morning and 30 mg tablet qhs   Yes Historical Provider, MD  Fluticasone-Salmeterol (ADVAIR) 250-50 MCG/DOSE AEPB Inhale 1 puff into the lungs 2 (two) times daily.   Yes Historical Provider, MD  gabapentin (NEURONTIN) 100 MG capsule Take 100 mg by mouth at bedtime.    Yes Historical Provider, MD  OXYGEN Inhale into the lungs at bedtime. Uses O2 -- intolerant to CPAP   Yes Historical Provider, MD  prazosin (MINIPRESS) 1 MG capsule Take 1 mg by mouth at bedtime.   Yes Historical Provider, MD  QUEtiapine (SEROQUEL  XR) 300 MG 24 hr tablet Take 300 mg by mouth at bedtime.    Yes Historical Provider, MD  ramipril (ALTACE) 10 MG capsule Take 5 mg by mouth every morning.    Yes Historical Provider, MD  tiotropium (SPIRIVA) 18 MCG inhalation capsule Place 18 mcg into inhaler and inhale every morning.    Yes Historical Provider, MD  traZODone (DESYREL) 150 MG tablet Take 150 mg by mouth at bedtime.   Yes Historical Provider, MD  zolpidem (AMBIEN CR) 12.5 MG CR tablet Take 12.5 mg by mouth at bedtime.     Yes Historical Provider, MD  nitroGLYCERIN (NITROSTAT) 0.4 MG SL tablet Place 1 tablet (0.4 mg total) under the tongue every 5 (five) minutes as needed for chest pain. 06/06/14   Leonie Man, MD    Family History Family History  Problem Relation Age of Onset  . Other Mother 19    Abdominal Aortic Aneurysm   . Stroke Mother   . Alzheimer's disease Father 67  . Stroke Father     Social History Social History  Substance Use Topics  . Smoking status: Current Every Day Smoker    Packs/day: 0.50    Years: 55.00    Types: Cigarettes  . Smokeless tobacco: Never Used  . Alcohol use No     Allergies   Aripiprazole; Aspirin; Crestor [rosuvastatin calcium]; Effexor [venlafaxine hydrochloride]; Lipitor [atorvastatin calcium]; Morphine and related; Zetia [ezetimibe]; and Zocor [simvastatin - high dose]   Review of Systems Review of Systems  All other systems reviewed and are negative.    Physical Exam Updated Vital Signs BP 126/79   Pulse 95   Temp 98.8 F (37.1 C)   Resp 19   Ht 6' (1.829 m)   SpO2 92%   Physical Exam  Constitutional: He is oriented to person, place, and time. He appears well-developed and well-nourished. No distress.  HENT:  Head: Normocephalic and atraumatic.  Right Ear: External ear normal.  Left Ear: External ear normal.  Nose: Nose normal.  Mouth/Throat: Oropharynx is clear and moist.  Eyes: EOM are normal. Pupils are equal, round, and reactive to light.  Neck:  Normal range of motion.  Neck supple.  Cardiovascular: Normal rate, regular rhythm and normal heart sounds.   Pulmonary/Chest: Effort normal.  Diffuse rhonchi, decreased breath sounds, and extra releases.  Abdominal: Soft. Bowel sounds are normal.  Musculoskeletal: He exhibits edema.  Neurological: He is alert and oriented to person, place, and time. He displays normal reflexes.  Skin: Skin is warm. Capillary refill takes less than 2 seconds.  Psychiatric: He has a normal mood and affect. His behavior is normal.  Nursing note and vitals reviewed.    ED Treatments / Results  Labs (all labs ordered are listed, but only abnormal results are displayed) Labs Reviewed  BASIC METABOLIC PANEL - Abnormal; Notable for the following:       Result Value   Glucose, Bld 119 (*)    All other components within normal limits  CBC - Abnormal; Notable for the following:    MCV 101.1 (*)    All other components within normal limits  TROPONIN I - Abnormal; Notable for the following:    Troponin I 0.03 (*)    All other components within normal limits  I-STAT ARTERIAL BLOOD GAS, ED - Abnormal; Notable for the following:    pH, Arterial 7.344 (*)    pCO2 arterial 55.4 (*)    pO2, Arterial 183.0 (*)    Bicarbonate 30.1 (*)    Acid-Base Excess 3.0 (*)    All other components within normal limits  BRAIN NATRIURETIC PEPTIDE  INFLUENZA PANEL BY PCR (TYPE A & B, H1N1)    EKG  EKG Interpretation  Date/Time:  Sunday July 21 2016 16:09:51 EST Ventricular Rate:  87 PR Interval:  186 QRS Duration: 86 QT Interval:  380 QTC Calculation: 457 R Axis:   47 Text Interpretation:  Sinus rhythm with Premature atrial complexes Nonspecific ST and T wave abnormality Abnormal ECG Confirmed by Frisco Cordts MD, Andee Poles [phone]) on 07/21/2016 5:51:38 PM       Radiology Dg Chest 2 View  Result Date: 07/21/2016 CLINICAL DATA:  SOB, "heaviness" over entire chest and productive cough x 1 week. (white phlegm EXAM: CHEST  2  VIEW COMPARISON:  09/25/2015 FINDINGS: Changes from CABG surgery are stable. The cardiac silhouette is normal in size and configuration. No mediastinal or hilar masses. No convincing adenopathy. Moderate right and small left pleural effusions, stable. Lungs show prominent bronchovascular and interstitial markings, also stable. No lung consolidation. No pneumothorax. Skeletal structures are demineralized but grossly intact. IMPRESSION: 1. No significant change from the prior study. 2. Moderate right and small left pleural effusions. Bronchovascular and interstitial prominence without overt pulmonary edema. No convincing pneumonia. Electronically Signed   By: Lajean Manes M.D.   On: 07/21/2016 17:01    Procedures Procedures (including critical care time)  Medications Ordered in ED Medications  furosemide (LASIX) injection 60 mg (60 mg Intravenous Given 07/21/16 1833)  albuterol (PROVENTIL) (2.5 MG/3ML) 0.083% nebulizer solution 5 mg (5 mg Nebulization Given 07/21/16 1831)  methylPREDNISolone sodium succinate (SOLU-MEDROL) 125 mg/2 mL injection 125 mg (125 mg Intravenous Given 07/21/16 1833)  levofloxacin (LEVAQUIN) IVPB 750 mg (0 mg Intravenous Stopped 07/21/16 2017)     Initial Impression / Assessment and Plan / ED Course  I have reviewed the triage vital signs and the nursing notes.  Pertinent labs & imaging results that were available during my care of the patient were reviewed by me and considered in my medical decision making (see chart for details).  Clinical Course    1- copd exacerbation-patient given neb, solumedrol, levaquin,, flu negative  2-chf- lasix given, patient appears hemodynamically stable   Discussed with Dr. Blaine Hamper and will admit to a telemetry bed Final Clinical Impressions(s) / ED Diagnoses   Final diagnoses:  None    New Prescriptions New Prescriptions   No medications on file     Pattricia Boss, MD 07/21/16 2159

## 2016-07-22 DIAGNOSIS — J9621 Acute and chronic respiratory failure with hypoxia: Secondary | ICD-10-CM

## 2016-07-22 LAB — LIPID PANEL
Cholesterol: 186 mg/dL (ref 0–200)
HDL: 38 mg/dL — ABNORMAL LOW (ref >40)
LDL Cholesterol: 132 mg/dL — ABNORMAL HIGH (ref 0–99)
Total CHOL/HDL Ratio: 4.9 RATIO
Triglycerides: 78 mg/dL (ref <150)
VLDL: 16 mg/dL (ref 0–40)

## 2016-07-22 LAB — TROPONIN I
Troponin I: <0.03 ng/mL (ref <0.03)
Troponin I: <0.03 ng/mL (ref <0.03)

## 2016-07-22 LAB — STREP PNEUMONIAE URINARY ANTIGEN: STREP PNEUMO URINARY ANTIGEN: NEGATIVE

## 2016-07-22 LAB — HIV ANTIBODY (ROUTINE TESTING W REFLEX): HIV Screen 4th Generation wRfx: NONREACTIVE

## 2016-07-22 MED ORDER — GUAIFENESIN 100 MG/5ML PO SOLN
5.0000 mL | ORAL | Status: DC | PRN
  Administered 2016-07-22: 100 mg via ORAL
  Filled 2016-07-22: qty 5

## 2016-07-22 MED ORDER — IPRATROPIUM-ALBUTEROL 0.5-2.5 (3) MG/3ML IN SOLN
3.0000 mL | Freq: Four times a day (QID) | RESPIRATORY_TRACT | Status: DC
  Administered 2016-07-22 (×4): 3 mL via RESPIRATORY_TRACT
  Filled 2016-07-22 (×4): qty 3

## 2016-07-22 MED ORDER — GUAIFENESIN-DM 100-10 MG/5ML PO SYRP: 5.0000 mL | ORAL_SOLUTION | ORAL | Status: DC | PRN

## 2016-07-22 NOTE — Progress Notes (Signed)
Patient arrived to 5w23 via stretcher from Emergency room. Admitted with COPD exacerbation.  Wife is at the bedside. Patient is alert and oriented. He is hard of hearing. O2 applied at 2 LPM via nasal cannula. Patient is O2 dependent at home. Also has nebs scheduled Q4 hours which he also takes at home. Patient has a frequent productive cough with thick yellow/green sputum noted. Patient aware that a sputum sample is needed for culture. Patient is ambulatory. He does not use any assistive devices for ambulating. He does have upper and lower dentures which he does not wear. Weakness noted to bilateral lower extremities. All skin is clean dry and intact. Verified Skin with charge nurse Lexine Baton. Patient and wife verbalized understanding about being a high fall risk and both aware of bed alarm and when to call for assistance. Respirations are even and unlabored. Bilateral breath sounds are coarse with expiratory wheezing noted.  No rhonchi heard upon assessment. SOB on exertion noted but no respiratory distress noted. Encouraged patient to turn, cough, and deep breathe. Provided patient with incentive spirometer. Patient and wife verbalized understanding about proper use of the incentive spirometer. Bowel sounds positive in all 4 quads. Telemetry box #24 applied. Normal sinus rhythm noted. Patient denies pain. Bilateral radial and dorsalis pedis pulses are palpable. No edema noted. Urine sample collected and sent to lab. See H&P for PMH. No s/s of distress noted. Will continue to monitor and treat per MD orders.

## 2016-07-22 NOTE — Evaluation (Signed)
Occupational Therapy Evaluation and Discharge Patient Details Name: John Ball MRN: [id number] DOB: [birth year] Today's Date: 07/22/2016    History of Present Illness  John Ball is a 76 y.o. male with PMHx:  HTN, COPD, on 2 L oxygen at home, depression, anxiety, tobacco abuse, CAD, S/P of CABG, PTSD, OSA, PVD, DVT, CHF, and severe hearing loss bilaterally, presents with shortness breath and cough   Clinical Impression   This 76 yo male admitted with above presents to acute OT at a Mod I level for all basic ADLs. His sats at rest on 2 liters was 95 with HR of 87 and post activity on 2 liters sats 99% and HR 100. No further OT needs, we will sign off.     Follow Up Recommendations  No OT follow up    Equipment Recommendations  None recommended by OT       Precautions / Restrictions Precautions Precautions: None Precaution Comments: on home O2 (2 liters) Restrictions Weight Bearing Restrictions: No      Mobility Bed Mobility Overal bed mobility: Independent                Transfers Overall transfer level: Modified independent Equipment used: None                       ADL Overall ADL's : Modified independent                                                       Pertinent Vitals/Pain Pain Assessment: No/denies pain     Hand Dominance Right   Extremity/Trunk Assessment Upper Extremity Assessment Upper Extremity Assessment: Overall WFL for tasks assessed   Lower Extremity Assessment Lower Extremity Assessment: Defer to PT evaluation       Communication Communication Communication: No difficulties   Cognition Arousal/Alertness: Awake/alert Behavior During Therapy: WFL for tasks assessed/performed Overall Cognitive Status: Within Functional Limits for tasks assessed                                Home Living Family/patient expects to be discharged to:: Private residence Living Arrangements:  Spouse/significant other Available Help at Discharge: Family;Available 24 hours/day Type of Home: House Home Access: Level entry     Home Layout: One level     Bathroom Shower/Tub: Walk-in shower;Curtain   Bathroom Toilet: Handicapped height     Home Equipment: Environmental consultant - 2 wheels   Additional Comments: does have one step in his house; on 2liters of O2 this weekend      Prior Functioning/Environment Level of Independence: Independent                      OT Goals(Current goals can be found in the care plan section) Acute Rehab OT Goals Patient Stated Goal: to be able to always breath this good  OT Frequency:                End of Session Equipment Utilized During Treatment: Gait belt;Oxygen (2 liters)  Activity Tolerance: Patient tolerated treatment well (purse lipped breathing he did without cues) Patient left: in chair;with call bell/phone within reach;with chair alarm set   Time: 9169-4503 OT Time Calculation (min): 22 min Charges:  OT General  Charges $OT Visit: 1 Procedure OT Evaluation $OT Eval Moderate Complexity: 1 Procedure  Almon Register [id number] 07/22/2016, 9:08 AM

## 2016-07-22 NOTE — Evaluation (Signed)
Physical Therapy Evaluation Patient Details Name: John Ball MRN: [id number] DOB: 1941/02/04 Today's Date: 07/22/2016   History of Present Illness   John Ball is a 76 y.o. male with PMHx:  HTN, COPD, on 2 L oxygen at home, depression, anxiety, tobacco abuse, CAD, S/P of CABG, PTSD, OSA, PVD, DVT, CHF, and severe hearing loss bilaterally, presents with shortness breath and cough  Clinical Impression  Pt functioning near baseline however ambulation limited by DOE. SpO2 dec to 87% on 2LO2 via [place]. Acute PT to follow to progress activity tolerance and independence.    Follow Up Recommendations No PT follow up;Supervision/Assistance - 24 hour    Equipment Recommendations  None recommended by PT    Recommendations for Other Services       Precautions / Restrictions Precautions Precautions: None Precaution Comments: on home O2 (2 liters) Restrictions Weight Bearing Restrictions: No      Mobility  Bed Mobility Overal bed mobility: Independent             General bed mobility comments: pt up in chair  Transfers Overall transfer level: Modified independent Equipment used: None             General transfer comment: pt with good technique  Ambulation/Gait Ambulation/Gait assistance: Min guard Ambulation Distance (Feet): 120 Feet Assistive device: Rolling walker (2 wheeled) Gait Pattern/deviations: Step-through pattern;Decreased stride length Gait velocity: slow Gait velocity interpretation: Below normal speed for age/gender General Gait Details: DOE, SpO2 dec to 87% on 2LO2 via [place]  Stairs            Wheelchair Mobility    Modified Rankin (Stroke Patients Only)       Balance Overall balance assessment: Modified Independent                                           Pertinent Vitals/Pain Pain Assessment: No/denies pain    Home Living Family/patient expects to be discharged to:: Private residence Living Arrangements:  Spouse/significant other Available Help at Discharge: Family;Available 24 hours/day Type of Home: House Home Access: Level entry     Home Layout: One level Home Equipment: Walker - 2 wheels Additional Comments: does have one step in his house; on 2liters of O2 this weekend    Prior Function Level of Independence: Independent               Hand Dominance   Dominant Hand: Right    Extremity/Trunk Assessment   Upper Extremity Assessment Upper Extremity Assessment: Defer to OT evaluation    Lower Extremity Assessment Lower Extremity Assessment: Overall WFL for tasks assessed    Cervical / Trunk Assessment Cervical / Trunk Assessment: Normal  Communication   Communication: No difficulties  Cognition Arousal/Alertness: Awake/alert Behavior During Therapy: WFL for tasks assessed/performed Overall Cognitive Status: Within Functional Limits for tasks assessed                      General Comments      Exercises     Assessment/Plan    PT Assessment Patient needs continued PT services  PT Problem List Cardiopulmonary status limiting activity          PT Treatment Interventions DME instruction;Gait training;Stair training;Functional mobility training;Therapeutic activities;Therapeutic exercise;Balance training    PT Goals (Current goals can be found in the Care Plan section)  Acute Rehab PT Goals PT Goal  Formulation: With patient Time For Goal Achievement: 07/29/16 Potential to Achieve Goals: Good Additional Goals Additional Goal #1: Pt to score >19 on DGI to indicate minimal falls risk.    Frequency Min 2X/week   Barriers to discharge        Co-evaluation               End of Session Equipment Utilized During Treatment: Gait belt Activity Tolerance:  (limited by DOE) Patient left: in chair;with call bell/phone within reach;with chair alarm set (MD present) Nurse Communication: Mobility status         Time: 9093-1121 PT Time  Calculation (min) (ACUTE ONLY): 19 min   Charges:   PT Evaluation $PT Eval Low Complexity: 1 Procedure     PT G Codes:        Josh Nicolosi M Belmira Daley 07/22/2016, 10:04 AM   Kittie Plater, PT, DPT Pager #: [phone] Office #: [phone]

## 2016-07-22 NOTE — Progress Notes (Signed)
[facility] Progress Note  Patient: John Ball FOY:[id number]   PCP: Leeroy Cha, MD DOB: [birth year]   DOA: 07/21/2016   DOS: 07/22/2016   Date of Service: the patient was seen and examined on 07/22/2016  Brief hospital course: Pt. with PMH of COPD, HTN, chronic 2 L of oxygen use at home, anxiety, depression, CAD S/P CABG, OSA, PVD, CHF; admitted on 07/21/2016, with complaint of shortness of breath, was found to have COPD exacerbation due to bronchitis. Currently further plan is continue current plan.  Assessment and Plan: 1. Acute on chronic respiratory failure with hypoxia (HCC) Feeling better, but remains hypoxic on ambulation on 2 L of oxygen. Continue nebulizers, continue this to Medrol 60 mg twice a day. Continue azithromycin. Follow up on blood cultures.  2. Coronary artery disease. S/P CABG in the past. No chest pain at present. Continue to closely monitor.   3. Tobacco abuse. Nicotine patch. Counseled the patient regarding quitting the smoking.  4. Chronic CHF. Unsure regarding the type of the CHF, likely diastolic. Euvolemic at present. Patient was given IV Lasix in the ER. We'll continue to closely monitor.  5. HTN. A relatively stable at present. Continuing ramipril and prazosin.  6. Mood disorder. Continuing home medication.  Bowel regimen: last BM 07/21/2016 Diet: Cardiac diet DVT Prophylaxis: subcutaneous Heparin  Advance goals of care discussion: full  Family Communication: no family was present at bedside, at the time of interview.   Disposition:  Discharge to home. Expected discharge date: 01/16-17/2018  Consultants: none Procedures: none  Antibiotics: Anti-infectives    Start     Dose/Rate Route Frequency Ordered Stop   07/23/16 1800  azithromycin (ZITHROMAX) tablet 250 mg     250 mg Oral Daily 07/21/16 2314 07/27/16 0959   07/22/16 1800  azithromycin (ZITHROMAX) tablet 500 mg     500 mg Oral Every evening 07/21/16 2314  07/23/16 1759   07/21/16 1800  levofloxacin (LEVAQUIN) IVPB 750 mg     750 mg 100 mL/hr over 90 Minutes Intravenous  Once 07/21/16 1759 07/21/16 2017        Subjective: Feeling better, continues to have shortness of breath at rest as well as on exertion. Unable to talk in full sentences.  Objective: Physical Exam: Vitals:   07/22/16 0432 07/22/16 0440 07/22/16 0936 07/22/16 1237  BP:  102/71    Pulse: (!) 106 (!) 102    Resp: 20 18    Temp:  97.8 F (36.6 C)    TempSrc:  Oral    SpO2: 96% 96% 93% 95%  Weight:      Height:        Intake/Output Summary (Last 24 hours) at 07/22/16 1407 Last data filed at 07/21/16 2048  Gross per 24 hour  Intake              150 ml  Output             1375 ml  Net            -1225 ml   Filed Weights   07/21/16 2314 07/21/16 2315  Weight: 95.3 kg (210 lb 3.2 oz) 95 kg (209 lb 6.4 oz)    General: Alert, Awake and Oriented to Time, Place and Person. Appear in moderate distress, affect appropriate Eyes: PERRL, Conjunctiva normal ENT: Oral Mucosa clear moist. Neck: no JVD, no Abnormal Mass Or lumps Cardiovascular: S1 and S2 Present, aortic systolic  Murmur, Respiratory: Bilateral Air entry equal and Decreased, no use of  accessory muscle, no Crackles, bilateral wheezes Abdomen: Bowel Sound present, Soft and no tenderness Skin: no redness, no Rash, no induration Extremities: no Pedal edema, no calf tenderness Neurologic: Grossly no focal neuro deficit. Bilaterally Equal motor strength  Data Reviewed: CBC:  Recent Labs Lab 07/21/16 1615  WBC 7.5  HGB 15.5  HCT 47.1  MCV 101.1*  PLT 409   Basic Metabolic Panel:  Recent Labs Lab 07/21/16 1615  NA 142  K 4.5  CL 105  CO2 27  GLUCOSE 119*  BUN 14  CREATININE 0.98  CALCIUM 9.8    Liver Function Tests: No results for input(s): AST, ALT, ALKPHOS, BILITOT, PROT, ALBUMIN in the last 168 hours. No results for input(s): LIPASE, AMYLASE in the last 168 hours. No results for  input(s): AMMONIA in the last 168 hours. Coagulation Profile: No results for input(s): INR, PROTIME in the last 168 hours. Cardiac Enzymes:  Recent Labs Lab 07/21/16 1615 07/21/16 2324 07/22/16 0456 07/22/16 1041  TROPONINI 0.03* <0.03 <0.03 <0.03   BNP (last 3 results) No results for input(s): PROBNP in the last 8760 hours.  CBG: No results for input(s): GLUCAP in the last 168 hours.  Studies: Dg Chest 2 View  Result Date: 07/21/2016 CLINICAL DATA:  SOB, "heaviness" over entire chest and productive cough x 1 week. (white phlegm EXAM: CHEST  2 VIEW COMPARISON:  09/25/2015 FINDINGS: Changes from CABG surgery are stable. The cardiac silhouette is normal in size and configuration. No mediastinal or hilar masses. No convincing adenopathy. Moderate right and small left pleural effusions, stable. Lungs show prominent bronchovascular and interstitial markings, also stable. No lung consolidation. No pneumothorax. Skeletal structures are demineralized but grossly intact. IMPRESSION: 1. No significant change from the prior study. 2. Moderate right and small left pleural effusions. Bronchovascular and interstitial prominence without overt pulmonary edema. No convincing pneumonia. Electronically Signed   By: Lajean Manes M.D.   On: 07/21/2016 17:01     Scheduled Meds: . azithromycin  500 mg Oral QPM   Followed by  . [START ON 07/23/2016] azithromycin  250 mg Oral Daily  . cholecalciferol  1,000 Units Oral q morning - 10a  . dextromethorphan-guaiFENesin  1 tablet Oral BID  . DULoxetine  30 mg Oral QHS  . DULoxetine  60 mg Oral Daily  . enoxaparin (LOVENOX) injection  40 mg Subcutaneous Q24H  . gabapentin  100 mg Oral QHS  . ipratropium-albuterol  3 mL Nebulization QID  . methylPREDNISolone (SOLU-MEDROL) injection  60 mg Intravenous Q12H  . nicotine  21 mg Transdermal Daily  . prazosin  1 mg Oral QHS  . QUEtiapine  300 mg Oral QHS  . ramipril  5 mg Oral q morning - 10a  . traZODone  150 mg  Oral QHS   Continuous Infusions: PRN Meds: albuterol, clonazePAM, nitroGLYCERIN, ondansetron, zolpidem  Time spent: 30 minutes  Author: Berle Mull, MD [facility] Pager: [phone] 07/22/2016 2:07 PM  If 7PM-7AM, please contact night-coverage at [email or web address], password [facility]

## 2016-07-22 NOTE — Progress Notes (Addendum)
Nutrition Brief Note  RD consulted for assessment of nutritional needs/status per COPD GOLD protocol.  Wt Readings from Last 15 Encounters:  07/21/16 209 lb 6.4 oz (95 kg)  06/10/16 220 lb 6.4 oz (100 kg)  03/07/16 221 lb (100.2 kg)  03/05/16 220 lb 6.4 oz (100 kg)  06/30/15 218 lb (98.9 kg)  02/21/15 217 lb (98.4 kg)  01/26/15 216 lb (98 kg)  01/11/15 215 lb (97.5 kg)  01/03/15 217 lb (98.4 kg)  07/12/14 212 lb (96.2 kg)  06/28/14 212 lb 8 oz (96.4 kg)  06/22/14 213 lb 3.2 oz (96.7 kg)  06/17/14 213 lb (96.6 kg)  06/06/14 213 lb 1.6 oz (96.7 kg)  05/09/14 215 lb 1.6 oz (97.6 kg)   John Ball is a 76 y.o. male with medical history significant of Hypertension, COPD, on 2 L oxygen at home, depression, anxiety, tobacco abuse, CAD, S/P of CABG, PTSD, OSA not on CPAP, PVD, DVT, CHF (no 2-D echo on record), severe hearing loss bilaterally, squamous cell lung cancer (stage IA, s/p of right lobectomy, no further treatment needed), who presents with shortness breath and cough.  Pt on phone or sleeping soundly at times of both visits.   Per MST report, denies any weight loss or poor appetite. Noted UBW ranges between 212-217#.   Pt did not appear to have signs of fat or muscle wasting.   Body mass index is 28.4 kg/m. Patient meets criteria for overweight based on current BMI.   Current diet order is Heart Healthy, patient is consuming approximately n/a% of meals at this time. Labs and medications reviewed.   No nutrition interventions warranted at this time. If nutrition issues arise, please consult RD.   John Ball A. John Ball, RD, LDN, CDE Pager: [phone] After hours Pager: [phone]

## 2016-07-23 LAB — HEMOGLOBIN A1C
HEMOGLOBIN A1C: 5.8 % — AB (ref 4.8–5.6)
MEAN PLASMA GLUCOSE: 120 mg/dL

## 2016-07-23 MED ORDER — NICOTINE 21 MG/24HR TD PT24: 21.0000 mg | MEDICATED_PATCH | Freq: Every day | TRANSDERMAL | 0 refills | Status: DC

## 2016-07-23 MED ORDER — PREDNISONE 10 MG PO TABS: ORAL_TABLET | ORAL | 0 refills | Status: DC

## 2016-07-23 MED ORDER — GUAIFENESIN 100 MG/5ML PO SOLN: 5.0000 mL | ORAL | 0 refills | Status: DC | PRN

## 2016-07-23 MED ORDER — LEVOFLOXACIN 500 MG PO TABS: 500.0000 mg | ORAL_TABLET | Freq: Every day | ORAL | 0 refills | Status: DC

## 2016-07-23 MED ORDER — DM-GUAIFENESIN ER 30-600 MG PO TB12: 1.0000 | ORAL_TABLET | Freq: Two times a day (BID) | ORAL | 0 refills | Status: DC

## 2016-07-23 MED ORDER — IPRATROPIUM-ALBUTEROL 0.5-2.5 (3) MG/3ML IN SOLN
3.0000 mL | Freq: Three times a day (TID) | RESPIRATORY_TRACT | Status: DC
  Administered 2016-07-23: 3 mL via RESPIRATORY_TRACT
  Filled 2016-07-23: qty 3

## 2016-07-23 MED ORDER — LEVOFLOXACIN 500 MG PO TABS
500.0000 mg | ORAL_TABLET | Freq: Every day | ORAL | Status: DC
  Administered 2016-07-23: 500 mg via ORAL
  Filled 2016-07-23: qty 1

## 2016-07-23 MED ORDER — LEVOFLOXACIN 500 MG PO TABS: 500.0000 mg | ORAL_TABLET | Freq: Every day | ORAL | 0 refills | Status: AC

## 2016-07-23 MED ORDER — PREDNISONE 50 MG PO TABS: 50.0000 mg | ORAL_TABLET | Freq: Every day | ORAL | Status: DC

## 2016-07-23 NOTE — Care Management Note (Addendum)
Case Management Note  Patient Details  Name: John Ball MRN: [id number] Date of Birth: 30-Jul-1940  Subjective/Objective:            Pt with hx of COPD, HTN, chronic 2 L of oxygen use at home, anxiety, depression, CAD S/P CABG, OSA, PVD, CHF. Admitted with complaints of shortness of breath, found to have COPD exacerbation due to bronchitis. Resides with wife. States independent with ADL's. DME: oxygen, nebulizer.  PCP: Leeroy Cha  Dr. Sylvan Cheese( pulmonologist)  Action/Plan: Plan is to dc to home today.  Expected Discharge Date:  07/23/16               Expected Discharge Plan:  Home/Self Care  In-House Referral:     Discharge planning Services     Post Acute Care Choice:  Resumption of Svcs/PTA Provider Choice offered to:  Patient  DME Arranged:  Oxygen DME Agency:   [facility]  HH Arranged:    HH Agency:     Status of Service:  Completed, signed off  If discussed at [place] of Stay Meetings, dates discussed:    Additional Comments: CM received COPD Gold Protocol. Pt has no needs regarding COPD. States is being managed per Dr.Snood.  Whitman Hero [place], RN 07/23/2016, 10:26 AM

## 2016-07-23 NOTE — Progress Notes (Signed)
Patient is discharge home without O2 refusing to wait for home oxygen. Discharge instruction given, patient and wife verbalized understanding,

## 2016-07-26 LAB — CULTURE, BLOOD (ROUTINE X 2)
Culture: NO GROWTH
Culture: NO GROWTH

## 2016-07-30 NOTE — Discharge Summary (Signed)
[facility] Discharge Summary   Patient: John Ball GGE:[id number]   PCP: Leeroy Cha, MD DOB: 07/24/1940   Date of admission: 07/21/2016   Date of discharge: 07/23/2016    Discharge Diagnoses:  Principal Problem:   Acute on chronic respiratory failure with hypoxia ([place]) Active Problems:   Squamous cell lung cancer ([place])   S/P CABG x 4   Peripheral arterial occlusive disease ([place])   Smoker unmotivated to quit   Essential hypertension   Coronary artery disease involving coronary bypass graft of native heart without angina pectoris   COPD exacerbation (HCC)   CHF (congestive heart failure) (HCC)   Depression   Elevated troponin   Admitted From: home Disposition:  home  Recommendations for Outpatient Follow-up:  1. Follow-up with PCP in one week  Follow-up Information    Leeroy Cha, MD. Schedule an appointment as soon as possible for a visit in 1 week(s).   Specialty:  Internal Medicine Contact information: [address] [place] [address] [phone]          Diet recommendation: Cardiac diet  Activity: The patient is advised to gradually reintroduce usual activities.  Discharge Condition: good  Code Status: Full code  History of present illness: As per the H and P dictated on admission, "John Ball is a 76 y.o. male with medical history significant of Hypertension, COPD, on 2 L oxygen at home, depression, anxiety, tobacco abuse, CAD, S/P of CABG, PTSD, OSA not on CPAP, PVD, DVT, CHF (no 2-D echo on record), severe hearing loss bilaterally, squamous cell lung cancer (stage IA, s/p of right lobectomy, no further treatment needed), who presents with shortness breath and cough.  Patient states that he has been having shortness of breath for almost a week. He has cough with clear mucus production. He also has wheezing. Denies fever, chills, chest pain, runny nose or sore throat. Patient does not have a nausea,  vomiting, diarrhea, abdominal pain, symptoms of UTI or unilateral weakness. No leg edema."  Hospital Course:   Summary of his active problems in the hospital is as following. 1. Acute on chronic respiratory failure with hypoxia (HCC) Presented with worsening hypoxia and shortness of breath with wheezing. Influenza PCR negative. Blood cultures negative. Started on azithromycin, with switch to Levaquin on discharge. Finish prednisone taper  2. Coronary artery disease. S/P CABG in the past. No chest pain at present. Continue to closely monitor.   3. Tobacco abuse. Nicotine patch. Counseled the patient regarding quitting the smoking.  4. Chronic CHF. Unsure regarding the type of the CHF, likely diastolic. Euvolemic at present. Patient was given IV Lasix in the ER.  5. HTN. relatively stable at present. Continuing ramipril and prazosin.  6. Mood disorder. Continuing home medication.  All other chronic medical condition were stable during the hospitalization.  Patient was ambulatory seen by physical therapy, who recommended no further therapy on discharge On the day of the discharge the patient's vitals were stable, and no other acute medical condition were reported by patient. the patient was felt safe to be discharge at home with family.  Procedures and Results:  none   Consultations:  none  DISCHARGE MEDICATION: Discharge Medication List as of 07/23/2016 12:23 PM    CONTINUE these medications which have CHANGED   Details  dextromethorphan-guaiFENesin (MUCINEX DM) 30-600 MG 12hr tablet Take 1 tablet by mouth 2 (two) times daily., Starting Tue 07/23/2016, Normal    guaiFENesin (ROBITUSSIN) 100 MG/5ML SOLN Take 5 mLs (100  mg total) by mouth every 4 (four) hours as needed for cough or to loosen phlegm., Starting Tue 07/23/2016, Normal    levofloxacin (LEVAQUIN) 500 MG tablet Take 1 tablet (500 mg total) by mouth daily., Starting Tue 07/23/2016, Until Sat 07/27/2016,  Normal    nicotine (NICODERM CQ - DOSED IN MG/24 HOURS) 21 mg/24hr patch Place 1 patch (21 mg total) onto the skin daily., Starting Tue 07/23/2016, Normal    predniSONE (DELTASONE) 10 MG tablet Take '50mg'$  daily for 3days,Take '40mg'$  daily for 3days,Take '30mg'$  daily for 3days,Take '20mg'$  daily for 3days,Take '10mg'$  daily for 3days, then stop, Normal      CONTINUE these medications which have NOT CHANGED   Details  albuterol (PROVENTIL HFA;VENTOLIN HFA) 108 (90 BASE) MCG/ACT inhaler Inhale 1 puff into the lungs every 6 (six) hours as needed for wheezing or shortness of breath., Historical Med    albuterol (PROVENTIL) (5 MG/ML) 0.5% nebulizer solution Take 2.5 mg by nebulization every 6 (six) hours as needed for wheezing or shortness of breath., Historical Med    budesonide-formoterol (SYMBICORT) 160-4.5 MCG/ACT inhaler Inhale 2 puffs into the lungs daily as needed. For shortness of breath, Historical Med    Cholecalciferol (VITAMIN D3) 1000 UNITS CAPS Take 1 capsule by mouth every morning., Historical Med    clonazePAM (KLONOPIN) 1 MG tablet Take 1 mg by mouth 2 (two) times daily as needed for anxiety., Historical Med    DULoxetine (CYMBALTA) 60 MG capsule Take by mouth. 60 mg tablet every morning and 30 mg tablet qhs, Historical Med    Fluticasone-Salmeterol (ADVAIR) 250-50 MCG/DOSE AEPB Inhale 1 puff into the lungs 2 (two) times daily., Historical Med    gabapentin (NEURONTIN) 100 MG capsule Take 100 mg by mouth at bedtime. , Historical Med    nitroGLYCERIN (NITROSTAT) 0.4 MG SL tablet Place 1 tablet (0.4 mg total) under the tongue every 5 (five) minutes as needed for chest pain., Starting Mon 06/06/2014, Normal    OXYGEN Inhale into the lungs at bedtime. Uses O2 -- intolerant to CPAP, Historical Med    prazosin (MINIPRESS) 1 MG capsule Take 1 mg by mouth at bedtime., Historical Med    QUEtiapine (SEROQUEL XR) 300 MG 24 hr tablet Take 300 mg by mouth at bedtime. , Historical Med    ramipril  (ALTACE) 10 MG capsule Take 5 mg by mouth every morning. , Historical Med    tiotropium (SPIRIVA) 18 MCG inhalation capsule Place 18 mcg into inhaler and inhale every morning. , Historical Med    traZODone (DESYREL) 150 MG tablet Take 150 mg by mouth at bedtime., Historical Med    zolpidem (AMBIEN CR) 12.5 MG CR tablet Take 12.5 mg by mouth at bedtime.  , Historical Med       Allergies  Allergen Reactions  . Aripiprazole Other (See Comments)    "makes him wild"  . Aspirin Other (See Comments)    Avoids -- bruises real bad  . Crestor [Rosuvastatin Calcium] Other (See Comments)    myalgia  . Effexor [Venlafaxine Hydrochloride] Itching  . Lipitor [Atorvastatin Calcium] Other (See Comments)    myalgia  . Morphine And Related Itching  . Zetia [Ezetimibe] Other (See Comments)    myalgia  . Zocor [Simvastatin - High Dose] Other (See Comments)    myalgia   Discharge Instructions    Diet - low sodium heart healthy    Complete by:  As directed    Discharge instructions    Complete by:  As directed  It is important that you read following instructions as well as go over your medication list with RN to help you understand your care after this hospitalization.  Discharge Instructions: Please follow-up with PCP in one week  Please request your primary care physician to go over all Hospital Tests and Procedure/Radiological results at the follow up,  Please get all Hospital records sent to your PCP by signing hospital release before you go home.   Do not take more than prescribed Pain, Sleep and Anxiety Medications. You were cared for by a hospitalist during your hospital stay. If you have any questions about your discharge medications or the care you received while you were in the hospital after you are discharged, you can call the unit and ask to speak with the hospitalist on call if the hospitalist that took care of you is not available.  Once you are discharged, your primary care  physician will handle any further medical issues. Please note that NO REFILLS for any discharge medications will be authorized once you are discharged, as it is imperative that you return to your primary care physician (or establish a relationship with a primary care physician if you do not have one) for your aftercare needs so that they can reassess your need for medications and monitor your lab values. You Must read complete instructions/literature along with all the possible adverse reactions/side effects for all the Medicines you take and that have been prescribed to you. Take any new Medicines after you have completely understood and accept all the possible adverse reactions/side effects. Wear Seat belts while driving. If you have smoked or chewed Tobacco in the last 2 yrs please stop smoking and/or stop any Recreational drug use.   Increase activity slowly    Complete by:  As directed      Discharge Exam: Filed Weights   07/21/16 2314 07/21/16 2315  Weight: 95.3 kg (210 lb 3.2 oz) 95 kg (209 lb 6.4 oz)   Vitals:   07/22/16 2305 07/23/16 0537  BP: 137/64 133/70  Pulse:  98  Resp:  (!) 22  Temp:  98.1 F (36.7 C)   General: Appear in no distress, no Rash; Oral Mucosa moist. Cardiovascular: S1 and S2 Present, no Murmur, no JVD Respiratory: Bilateral Air entry present and Clear to Auscultation, no Crackles, Occasional wheezes Abdomen: Bowel Sound present, Soft and no tenderness Extremities: no Pedal edema, no calf tenderness Neurology: Grossly no focal neuro deficit.  The results of significant diagnostics from this hospitalization (including imaging, microbiology, ancillary and laboratory) are listed below for reference.    Significant Diagnostic Studies: Dg Chest 2 View  Result Date: 07/21/2016 CLINICAL DATA:  SOB, "heaviness" over entire chest and productive cough x 1 week. (white phlegm EXAM: CHEST  2 VIEW COMPARISON:  09/25/2015 FINDINGS: Changes from CABG surgery are stable.  The cardiac silhouette is normal in size and configuration. No mediastinal or hilar masses. No convincing adenopathy. Moderate right and small left pleural effusions, stable. Lungs show prominent bronchovascular and interstitial markings, also stable. No lung consolidation. No pneumothorax. Skeletal structures are demineralized but grossly intact. IMPRESSION: 1. No significant change from the prior study. 2. Moderate right and small left pleural effusions. Bronchovascular and interstitial prominence without overt pulmonary edema. No convincing pneumonia. Electronically Signed   By: Lajean Manes M.D.   On: 07/21/2016 17:01    Microbiology: Recent Results (from the past 240 hour(s))  Culture, blood (routine x 2) Call MD if unable to obtain prior to antibiotics  being given     Status: None   Collection Time: 07/21/16 11:24 PM  Result Value Ref Range Status   Specimen Description BLOOD RIGHT ANTECUBITAL  Final   Special Requests BOTTLES DRAWN AEROBIC AND ANAEROBIC 5CC  Final   Culture NO GROWTH 5 DAYS  Final   Report Status 07/26/2016 FINAL  Final  Culture, blood (routine x 2) Call MD if unable to obtain prior to antibiotics being given     Status: None   Collection Time: 07/21/16 11:31 PM  Result Value Ref Range Status   Specimen Description BLOOD LEFT HAND  Final   Special Requests BOTTLES DRAWN AEROBIC AND ANAEROBIC 10ML EACH  Final   Culture NO GROWTH 5 DAYS  Final   Report Status 07/26/2016 FINAL  Final    Time spent: 30 minutes  Signed:  Berle Mull  [facility] 07/23/2016  11:58 AM

## 2016-07-31 ENCOUNTER — Ambulatory Visit (INDEPENDENT_AMBULATORY_CARE_PROVIDER_SITE_OTHER): Payer: Medicare Other | Admitting: Pulmonary Disease

## 2016-07-31 ENCOUNTER — Ambulatory Visit (INDEPENDENT_AMBULATORY_CARE_PROVIDER_SITE_OTHER)
Admission: RE | Admit: 2016-07-31 | Discharge: 2016-07-31 | Disposition: A | Discharge status: Home / Self Care | Payer: Medicare Other | Source: Ambulatory Visit | Attending: Pulmonary Disease | Admitting: Pulmonary Disease

## 2016-07-31 ENCOUNTER — Encounter: Payer: Self-pay | Admitting: Pulmonary Disease

## 2016-07-31 ENCOUNTER — Inpatient Hospital Stay: Payer: Medicare Other | Admitting: Pulmonary Disease

## 2016-07-31 VITALS — BP 118/80 | HR 80 | Ht 72.0 in | Wt 208.0 lb

## 2016-07-31 DIAGNOSIS — J441 Chronic obstructive pulmonary disease with (acute) exacerbation: Secondary | ICD-10-CM | POA: Diagnosis not present

## 2016-07-31 DIAGNOSIS — J90 Pleural effusion, not elsewhere classified: Secondary | ICD-10-CM | POA: Diagnosis not present

## 2016-07-31 DIAGNOSIS — J432 Centrilobular emphysema: Secondary | ICD-10-CM

## 2016-07-31 DIAGNOSIS — J9611 Chronic respiratory failure with hypoxia: Secondary | ICD-10-CM

## 2016-07-31 DIAGNOSIS — E119 Type 2 diabetes mellitus without complications: Secondary | ICD-10-CM | POA: Diagnosis not present

## 2016-07-31 DIAGNOSIS — F339 Major depressive disorder, recurrent, unspecified: Secondary | ICD-10-CM | POA: Diagnosis not present

## 2016-07-31 DIAGNOSIS — I2510 Atherosclerotic heart disease of native coronary artery without angina pectoris: Secondary | ICD-10-CM | POA: Diagnosis not present

## 2016-07-31 NOTE — Progress Notes (Signed)
Current Outpatient Prescriptions on File Prior to Visit  Medication Sig  . albuterol (PROVENTIL HFA;VENTOLIN HFA) 108 (90 BASE) MCG/ACT inhaler Inhale 1 puff into the lungs every 6 (six) hours as needed for wheezing or shortness of breath.  Marland Kitchen albuterol (PROVENTIL) (5 MG/ML) 0.5% nebulizer solution Take 2.5 mg by nebulization every 6 (six) hours as needed for wheezing or shortness of breath.  . budesonide-formoterol (SYMBICORT) 160-4.5 MCG/ACT inhaler Inhale 2 puffs into the lungs daily as needed. For shortness of breath  . Cholecalciferol (VITAMIN D3) 1000 UNITS CAPS Take 1 capsule by mouth every morning.  . clonazePAM (KLONOPIN) 1 MG tablet Take 1 mg by mouth 2 (two) times daily as needed for anxiety.  Marland Kitchen dextromethorphan-guaiFENesin (MUCINEX DM) 30-600 MG 12hr tablet Take 1 tablet by mouth 2 (two) times daily.  . DULoxetine (CYMBALTA) 60 MG capsule Take by mouth. 60 mg tablet every morning and 30 mg tablet qhs  . Fluticasone-Salmeterol (ADVAIR) 250-50 MCG/DOSE AEPB Inhale 1 puff into the lungs 2 (two) times daily.  Marland Kitchen gabapentin (NEURONTIN) 100 MG capsule Take 100 mg by mouth at bedtime.   Marland Kitchen guaiFENesin (ROBITUSSIN) 100 MG/5ML SOLN Take 5 mLs (100 mg total) by mouth every 4 (four) hours as needed for cough or to loosen phlegm.  . nicotine (NICODERM CQ - DOSED IN MG/24 HOURS) 21 mg/24hr patch Place 1 patch (21 mg total) onto the skin daily.  . nitroGLYCERIN (NITROSTAT) 0.4 MG SL tablet Place 1 tablet (0.4 mg total) under the tongue every 5 (five) minutes as needed for chest pain.  . OXYGEN Inhale into the lungs at bedtime. Uses O2 -- intolerant to CPAP  . prazosin (MINIPRESS) 1 MG capsule Take 1 mg by mouth at bedtime.  . predniSONE (DELTASONE) 10 MG tablet Take '50mg'$  daily for 3days,Take '40mg'$  daily for 3days,Take '30mg'$  daily for 3days,Take '20mg'$  daily for 3days,Take '10mg'$  daily for 3days, then stop  . QUEtiapine (SEROQUEL XR) 300 MG 24 hr tablet Take 300 mg by mouth at bedtime.   . ramipril (ALTACE) 10  MG capsule Take 5 mg by mouth every morning.   . tiotropium (SPIRIVA) 18 MCG inhalation capsule Place 18 mcg into inhaler and inhale every morning.   . traZODone (DESYREL) 150 MG tablet Take 150 mg by mouth at bedtime.  Marland Kitchen zolpidem (AMBIEN CR) 12.5 MG CR tablet Take 12.5 mg by mouth at bedtime.     No current facility-administered medications on file prior to visit.      Chief Complaint  Patient presents with  . Follow-up    Hospital Follow up. Breathing has been ok since d/c    Pulmonary tests CT chest 04/20/15 >> atherosclerosis, mild centrilobular/paraseptal emphysema, s/p RLL ectomy Spirometry 03/05/16 >> FEV1 1.21 (36%), FEV1% 54% ABG 03/07/16 >> pH 7.40, PaCO2 42.6, PaO2 59.3  Past medical history Anxiety, Depression, CAD s/p CABG, HTN, DVT, Panic attacks, HLD, Neuropathy, PTSD, Stage 1 NSCLC s/p Rt lower lobectomy 2012  Past surgical history, Family history, Social history, Allergies all reviewed.  Vital Signs BP 118/80 (BP Location: Left Arm, Patient Position: Sitting, Cuff Size: Normal)   Pulse 80   Ht 6' (1.829 m)   Wt 208 lb (94.3 kg)   SpO2 98%   BMI 28.21 kg/m   History of Present Illness John Ball is a 76 y.o. male smoker with COPD/emphysema/bronchitis, and chronic respiratory failure with hypoxia.  He was in hospital recently.  He still has cough and clear sputum.  He is using 2 liters oxygen.  He is frustrated with his current DME and wants to change.  He denies chest pain, fever, or leg swelling.  He is using symbicort prn, but also using advair bid (when he remembers to use it).  His CXR from hospital stay shows persistent Rt pleural effusion >> he wasn't aware he had fluid around his lung.  Physical Exam  General - wearing oxygen ENT- no sinus tenderness, no oral exudate, no LAN Cardiac - regular, no murmur Chest - decreased BS with dullness Rt base, no wheeze Abd - soft, non tender Ext - 1+ edema Skin - no rashes Neuro - normal strength Psych -  anxious  Dg Chest 2 View  Result Date: 07/21/2016 CLINICAL DATA:  SOB, "heaviness" over entire chest and productive cough x 1 week. (white phlegm EXAM: CHEST  2 VIEW COMPARISON:  09/25/2015 FINDINGS: Changes from CABG surgery are stable. The cardiac silhouette is normal in size and configuration. No mediastinal or hilar masses. No convincing adenopathy. Moderate right and small left pleural effusions, stable. Lungs show prominent bronchovascular and interstitial markings, also stable. No lung consolidation. No pneumothorax. Skeletal structures are demineralized but grossly intact. IMPRESSION: 1. No significant change from the prior study. 2. Moderate right and small left pleural effusions. Bronchovascular and interstitial prominence without overt pulmonary edema. No convincing pneumonia. Electronically Signed   By: Lajean Manes M.D.   On: 07/21/2016 17:01    Assessment/Plan  COPD with emphysema and chronic bronchitis. - continue spiriva, advair, and prn albuterol - advised that he doesn't need to use symbicort if he is using advair - complete prednisone taper  Chronic respiratory failure with hypoxia. - he has PaO2 < 60 and chronic leg edema in setting of COPD - continue 2 liters oxygen 24/7 - will switch him to [facility] for his DME  Tobacco abuse. - discussed options to assist with smoking cessation  Right pleural effusion. - will f/u CXR - could be related to changes after Rt lower lobectomy    Patient Instructions  Chest xray today >> will call with results  Can stop symbicort and just use advair twice per day  Follow up in 4 months    Chesley Mires, MD [facility] Pager:  [phone] 07/31/2016, 4:29 PM

## 2016-07-31 NOTE — Patient Instructions (Signed)
Chest xray today >> will call with results  Can stop symbicort and just use advair twice per day  Follow up in 4 months

## 2016-08-01 ENCOUNTER — Telehealth: Payer: Self-pay | Admitting: Pulmonary Disease

## 2016-08-01 NOTE — Telephone Encounter (Signed)
Dg Chest 2 View  Result Date: 07/31/2016 CLINICAL DATA:  Pleural effusion, increased dyspnea. History wedge resection. EXAM: CHEST  2 VIEW COMPARISON:  Chest x-ray 07/21/2016, CT chest 04/20/2015 FINDINGS: Chronic scarring in the right lung base. Trace right pleural effusion. Prior right lower lobectomy. Lungs are otherwise clear. No focal consolidation or pneumothorax. Stable cardiomediastinal silhouette. Prior CABG. No acute osseous abnormality. IMPRESSION: 1. No acute cardiopulmonary disease. 2. Stable right basilar scarring likely related to prior surgery. Electronically Signed   By: Kathreen Devoid   On: 07/31/2016 17:06     Spoke with pt's wife.  Explained that findings are suggestive of volume loss in setting of Rt lower lobectomy.  No additional interventions needed at this time.

## 2016-08-01 NOTE — Telephone Encounter (Signed)
Pt is requesting CXR results from 07/31/2016.  VS - please advise. Thanks.

## 2016-08-15 ENCOUNTER — Emergency Department (HOSPITAL_COMMUNITY)
Admission: EM | Admit: 2016-08-15 | Discharge: 2016-08-15 | Disposition: A | Discharge status: Home / Self Care | Payer: Medicare Other | Attending: Physician Assistant | Admitting: Physician Assistant

## 2016-08-15 ENCOUNTER — Emergency Department (HOSPITAL_COMMUNITY): Payer: Medicare Other

## 2016-08-15 ENCOUNTER — Encounter (HOSPITAL_COMMUNITY): Payer: Self-pay | Admitting: Emergency Medicine

## 2016-08-15 DIAGNOSIS — F17210 Nicotine dependence, cigarettes, uncomplicated: Secondary | ICD-10-CM | POA: Insufficient documentation

## 2016-08-15 DIAGNOSIS — I509 Heart failure, unspecified: Secondary | ICD-10-CM | POA: Insufficient documentation

## 2016-08-15 DIAGNOSIS — Z79899 Other long term (current) drug therapy: Secondary | ICD-10-CM | POA: Diagnosis not present

## 2016-08-15 DIAGNOSIS — I2510 Atherosclerotic heart disease of native coronary artery without angina pectoris: Secondary | ICD-10-CM | POA: Insufficient documentation

## 2016-08-15 DIAGNOSIS — R0602 Shortness of breath: Secondary | ICD-10-CM | POA: Diagnosis not present

## 2016-08-15 DIAGNOSIS — I110 Hypertensive heart disease with heart failure: Secondary | ICD-10-CM | POA: Insufficient documentation

## 2016-08-15 DIAGNOSIS — J441 Chronic obstructive pulmonary disease with (acute) exacerbation: Secondary | ICD-10-CM | POA: Diagnosis not present

## 2016-08-15 DIAGNOSIS — Z951 Presence of aortocoronary bypass graft: Secondary | ICD-10-CM | POA: Diagnosis not present

## 2016-08-15 LAB — I-STAT TROPONIN, ED
Troponin i, poc: 0 ng/mL (ref 0.00–0.08)
Troponin i, poc: 0.02 ng/mL (ref 0.00–0.08)

## 2016-08-15 LAB — CBC
HCT: 40.4 % (ref 39.0–52.0)
Hemoglobin: 13.4 g/dL (ref 13.0–17.0)
MCH: 31.8 pg (ref 26.0–34.0)
MCHC: 33.2 g/dL (ref 30.0–36.0)
MCV: 96 fL (ref 78.0–100.0)
PLATELETS: 123 10*3/uL — AB (ref 150–400)
RBC: 4.21 MIL/uL — ABNORMAL LOW (ref 4.22–5.81)
RDW: 13.8 % (ref 11.5–15.5)
WBC: 4.1 10*3/uL (ref 4.0–10.5)

## 2016-08-15 LAB — BASIC METABOLIC PANEL
Anion gap: 9 (ref 5–15)
BUN: 19 mg/dL (ref 6–20)
CO2: 26 mmol/L (ref 22–32)
CREATININE: 1.23 mg/dL (ref 0.61–1.24)
Calcium: 8.9 mg/dL (ref 8.9–10.3)
Chloride: 104 mmol/L (ref 101–111)
GFR calc Af Amer: >60 mL/min (ref >60)
GFR, EST NON AFRICAN AMERICAN: 56 mL/min — AB (ref >60)
Glucose, Bld: 95 mg/dL (ref 65–99)
Potassium: 3.8 mmol/L (ref 3.5–5.1)
SODIUM: 139 mmol/L (ref 135–145)

## 2016-08-15 LAB — BRAIN NATRIURETIC PEPTIDE: B NATRIURETIC PEPTIDE 5: 31.6 pg/mL (ref 0.0–100.0)

## 2016-08-15 MED ORDER — IPRATROPIUM-ALBUTEROL 0.5-2.5 (3) MG/3ML IN SOLN
3.0000 mL | Freq: Once | RESPIRATORY_TRACT | Status: AC
  Administered 2016-08-15: 3 mL via RESPIRATORY_TRACT
  Filled 2016-08-15: qty 3

## 2016-08-15 MED ORDER — PREDNISONE 20 MG PO TABS: ORAL_TABLET | ORAL | 0 refills | Status: DC

## 2016-08-15 MED ORDER — AZITHROMYCIN 250 MG PO TABS: 250.0000 mg | ORAL_TABLET | Freq: Once | ORAL | 0 refills | Status: AC

## 2016-08-15 MED ORDER — PREDNISONE 20 MG PO TABS
60.0000 mg | ORAL_TABLET | Freq: Once | ORAL | Status: AC
  Administered 2016-08-15: 60 mg via ORAL
  Filled 2016-08-15: qty 3

## 2016-08-15 NOTE — ED Triage Notes (Signed)
Per pt, states he woke up this am SOB-unable to catch breath-states he is having left arm pain as well-was hospitalized 3 weeks ago for CHF

## 2016-08-15 NOTE — Discharge Instructions (Signed)
You came with complaints of chest pain and shortness of breath. After nebulization treatment you felt much better. We are giving U steroids to help decrease inflammation as well as others azithromycin because we think you have a COPD exacerbation. If you have increased pain, shortness breath, concerns, please return immediately to the emergency department.

## 2016-08-15 NOTE — ED Provider Notes (Signed)
[facility] DEPT Provider Note   CSN: [id number] Arrival date & time: 08/15/16  1730     History   Chief Complaint Chief Complaint  Patient presents with  . Shortness of Breath    HPI John Ball is a 76 y.o. male.  HPI   Patient is a 76 year old male with history of COPD. Patient here with shortness of breath cough is increased sputum and chest pain with shortness of breath. Patient reports this been going on for the last several days. Patient was here earlier yesterday with his wife.  She has noted no fevers. Chest pain not made worse with exertion. Patient has chronic dyspnea on exertion and wears 2 L home oxygen. Patient continues to smoke every day.  Past Medical History:  Diagnosis Date  . Anxiety   . Arthritis   . CAD (coronary artery disease)    cardiologist-  dr kelly/ dr berry  ----  s/p  cabg x4 in 2004  w/ severe LM disease  . Complication of anesthesia    hx prolonged post op oxygen dependent/  hx hallucinations for 2 day post op lobectomy 2012  . COPD, severe ([place])   . Depression   . Dyspnea on exertion    CHRONIC  . Essential hypertension   . Hearing loss of both ears    does wear his hearing aids  . History of arterial bypass of lower extremity    RIGHT FEM-POP  . History of CHF (congestive heart failure)   . History of DVT of lower extremity   . History of panic attacks   . Hyperlipidemia with target LDL less than 70   . Left main coronary artery disease Jan 2004   a) Abn Myoview (RCA Ischemia) --> CATH: Ostial LM 60-70%, 50% mCx, 60-70% mid RI, 70-90-75% m-d RCA w/ 95% PLS --> CABG; b) Echo 8/'07: Normal LV thickness and function, mild LV dilation. Essentially normal echo  . 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 Nocturia   . OSA (obstructive sleep apnea)    uses O2  via [facility]  --  intolerate to CPAP  . Peripheral arterial occlusive disease ([place]) followed by dr Early--  per last dopplers 06-28-2014  no change right graft,  50-74% stenosis common and left mid superficial femoral artery   2011   a) Gore-Tex graft right AK popA-BK popA --> b) 2012: thrombosed graft --> thrombectomy with dacron patch = 1 V runnof via Peroneal; c) 01/2014: R femoropopliteal bypass with left femoral vein  . PTSD (post-traumatic stress disorder)    anxiety attack's---  [place] [employer]  . Recurrent productive cough    SMOKER'S COUGH  . 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 Diaphragmatic attenuation  . Smoker unmotivated to quit    Reportedly he came close to having a nervous breakdown when he last tried to quit  . 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 Wears glasses     Patient Active Problem List   Diagnosis Date Noted  . COPD exacerbation ([place]) 07/21/2016  . Acute on chronic respiratory failure with hypoxia ([place]) 07/21/2016  . CHF (congestive heart failure) ([place]) 07/21/2016  . Depression 07/21/2016  . Elevated troponin 07/21/2016  . Preoperative cardiovascular examination 01/28/2015  . Abnormal stress test   . Coronary artery disease involving coronary bypass graft of native heart without angina pectoris   . Chest pain at rest 06/06/2014  . Peripheral arterial occlusive disease ([place])   . Hyperlipidemia with target LDL less than 70   . Smoker unmotivated to quit   . Essential hypertension   . COPD (chronic obstructive pulmonary disease) ([place])   . Pain of  left lower extremity 02/01/2014  . Squamous cell lung cancer ([place]) 05/05/2011  . Left main coronary artery disease 07/08/2002  . 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/2002    Past Surgical History:  Procedure Laterality Date  . CARDIAC CATHETERIZATION  07-13-2002   Ischemia RCA region on Cardiolite// 60-70% ostial left main, 50% midCx, 60-70% mid RI,  70% mRCA, 90% JeNu and crux 75% RCA ,  95% PLA//  severe LM and 3Vessel CAD, perserved LV, ef 60%  . CARDIOVASCULAR STRESS TEST  last one  06-17-2014   dr  Ellyn Hack   Carlton Adam study with no exercise; Intermediate Risk Study;   moderate size and intensity, partially reversible inferior septal defect consistent with prior infarct and mild to moderate peri-infarct ischemia;  mild hypokinesis of the inferior septal wall,  normal LV function, ef 56%  . COLONOSCOPY  2008    WNL  . CORONARY ARTERY BYPASS GRAFT  07-27-2002  dr Malvin Johns, freeRIMA-RI,SVG-OM2, SVG-rPDA  . CYSTOSCOPY W/ URETERAL STENT PLACEMENT N/A 02/21/2015   Procedure: BILATERAL RETROGRADE PYELOGRAM;  Surgeon: Festus Aloe, MD;  Location: [facility];  Service: Urology;  Laterality: N/A;  . EYE SURGERY Left Nov 2013  . FEMORAL ARTERY - POPLITEAL ARTERY BYPASS GRAFT Right 10-24-2008   dr early   w/ GoreTex graft  . FEMORAL-TIBIAL BYPASS GRAFT Right 01/10/2014   Procedure: Right Femoral to Posterior Tibial Bypass Graft using Left Leg Vein, Thrombectomy Right Common Femoral and Profunda of Leg . ;  Surgeon: Rosetta Posner, MD;  Location: [facility];  Service: Vascular;  Laterality: Right;  . HYDROCELE EXCISION Left   . LEFT ANKLE SURGERY  1990's  . LEFT HEART CATHETERIZATION WITH CORONARY/GRAFT ANGIOGRAM N/A 07/12/2014   Procedure: LEFT HEART CATHETERIZATION WITH Beatrix Fetters;  Surgeon: Troy Sine, MD;  Location: [facility] CATH LAB;  Service: Cardiovascular;  Laterality: N/A;  mild LV dysfunction,  ef 45-50% , mild inferior hypocontractility; sig. native coronary obstuctive  disease with left to right predominant septal collateralization;  occluded SVG to RCA graft , other graft's patent  . RIGHT ABOVE KNEE POPLITEAL GRAFT TO BELOW KNEE POPLITEAL ARTERY BYPASS WITH REVERSE SAPHENOUS VEIN  03-02-2010  . THROMBECTOMY OF RIGHT FEMORAL TO ABOVE KNEE POPLITEAL GORETEX GRAFT ANGIOPLASTY OF GORETEX AND SAPHENOUS VEIN JUNCTION AND ABOVE KNEE POPLITEAL ARTERY   10-06-2010   DR EARLY  . TONSILLECTOMY    . TRANSURETHRAL RESECTION OF BLADDER TUMOR N/A 02/21/2015   Procedure: TRANSURETHRAL RESECTION OF BLADDER TUMOR (TURBT);  Surgeon: Festus Aloe, MD;  Location: [facility];  Service: Urology;  Laterality: N/A;  . VIDEO ASSISTED THORACOSCOPY (VATS)/WEDGE  RESECTION Right 05-01-2011    [facility]   LOWER LOBECTOMY W/  NODE DISSECTION       Home Medications    Prior to Admission medications   Medication Sig Start Date End Date Taking? Authorizing Provider  albuterol (PROVENTIL HFA;VENTOLIN HFA) 108 (90 BASE) MCG/ACT inhaler Inhale 1 puff into the lungs every 6 (six) hours as needed for wheezing or shortness of breath.    Historical Provider, MD  albuterol (PROVENTIL) (5 MG/ML) 0.5% nebulizer solution Take 2.5 mg by nebulization every 6 (six) hours as needed for wheezing or shortness of breath.    Historical Provider, MD  azithromycin (ZITHROMAX Z-PAK) 250 MG tablet Take 1 tablet (250 mg total) by mouth once. Take one daily for 4 days 08/15/16 08/15/16  Reighlyn Elmes Lyn Bianca Vester, MD  budesonide-formoterol (SYMBICORT) 160-4.5 MCG/ACT inhaler Inhale 2 puffs into the lungs daily as needed. For shortness of breath    Historical Provider, MD  Cholecalciferol (VITAMIN D3) 1000 UNITS CAPS Take 1 capsule by mouth every morning.    Historical Provider, MD  clonazePAM (KLONOPIN) 1 MG tablet Take 1 mg by mouth 2 (two) times daily as needed for anxiety.    Historical Provider, MD  dextromethorphan-guaiFENesin (MUCINEX DM) 30-600 MG 12hr tablet Take 1 tablet by mouth 2 (two) times  daily. 07/23/16   Lavina Hamman, MD  DULoxetine (CYMBALTA) 60 MG capsule Take by mouth. 60 mg tablet every morning and 30 mg tablet qhs    Historical Provider, MD  Fluticasone-Salmeterol (ADVAIR) 250-50 MCG/DOSE AEPB Inhale 1 puff into the lungs 2 (two) times daily.    Historical Provider, MD  gabapentin (NEURONTIN) 100 MG capsule Take 100 mg by mouth at bedtime.     Historical Provider, MD  guaiFENesin (ROBITUSSIN) 100 MG/5ML SOLN Take 5 mLs (100 mg total) by mouth every 4 (four) hours as needed for cough or to loosen phlegm. 07/23/16   Lavina Hamman, MD  nicotine (NICODERM CQ - DOSED IN MG/24 HOURS) 21 mg/24hr patch Place 1 patch (21 mg total) onto the skin daily. 07/23/16   Lavina Hamman, MD  nitroGLYCERIN (NITROSTAT) 0.4 MG SL tablet Place 1 tablet (0.4 mg total) under the tongue every 5 (five) minutes as needed for chest pain. 06/06/14   Leonie Man, MD  OXYGEN Inhale into the lungs at bedtime. Uses O2 -- intolerant to CPAP    Historical Provider, MD  prazosin (MINIPRESS) 1 MG capsule Take 1 mg by mouth at bedtime.    Historical Provider, MD  predniSONE (DELTASONE) 10 MG tablet Take '50mg'$  daily for 3days,Take '40mg'$  daily for 3days,Take '30mg'$  daily for 3days,Take '20mg'$  daily for 3days,Take '10mg'$  daily for 3days, then stop 07/24/16   Lavina Hamman, MD  predniSONE (DELTASONE) 20 MG tablet Day 1 and 2: Take 3 tabs  Day 3-5: Take 2 tabs.  Day 5-8: take 1 tab 08/15/16   Tabb Croghan Lyn Kerry Chisolm, MD  QUEtiapine (SEROQUEL XR) 300 MG 24 hr tablet Take 300 mg by mouth at bedtime.     Historical Provider, MD  ramipril (ALTACE) 10 MG capsule Take 5 mg by mouth every morning.     Historical Provider, MD  tiotropium (SPIRIVA) 18 MCG inhalation capsule Place 18 mcg into inhaler and inhale every morning.     Historical Provider, MD  traZODone (DESYREL) 150 MG tablet Take 150 mg by mouth at bedtime.    Historical Provider, MD  zolpidem (AMBIEN CR) 12.5 MG CR tablet Take 12.5 mg by mouth at bedtime.  Historical  Provider, MD    Family History Family History  Problem Relation Age of Onset  . Other Mother 32    Abdominal Aortic Aneurysm   . Stroke Mother   . Alzheimer's disease Father 34  . Stroke Father     Social History Social History  Substance Use Topics  . Smoking status: Current Every Day Smoker    Packs/day: 0.50    Years: 55.00    Types: Cigarettes  . Smokeless tobacco: Never Used  . Alcohol use No     Allergies   Aripiprazole; Aspirin; Crestor [rosuvastatin calcium]; Effexor [venlafaxine hydrochloride]; Lipitor [atorvastatin calcium]; Morphine and related; Zetia [ezetimibe]; and Zocor [simvastatin - high dose]   Review of Systems Review of Systems  Constitutional: Positive for fatigue. Negative for activity change.  Respiratory: Positive for cough, shortness of breath and wheezing.   Cardiovascular: Negative for chest pain.  Gastrointestinal: Negative for abdominal pain.     Physical Exam Updated Vital Signs BP 128/67 (BP Location: Right Arm)   Pulse 89   Temp 98.3 F (36.8 C) (Oral)   Resp 20   Ht 6' (1.829 m)   Wt 208 lb (94.3 kg)   SpO2 95%   BMI 28.21 kg/m   Physical Exam  Constitutional: He is oriented to person, place, and time. He appears well-nourished.  HENT:  Head: Normocephalic.  Eyes: Conjunctivae are normal.  Cardiovascular: Normal rate and regular rhythm.   Pulmonary/Chest: No respiratory distress. He has wheezes.  On home 2 L. Diffuse scattered wheezing. Purse lipped  breathing which patient states is baseline.  Abdominal: Soft. There is no tenderness.  Musculoskeletal: He exhibits no edema.  Neurological: He is oriented to person, place, and time.  Skin: Skin is warm and dry. He is not diaphoretic.  Psychiatric: He has a normal mood and affect. His behavior is normal.     ED Treatments / Results  Labs (all labs ordered are listed, but only abnormal results are displayed) Labs Reviewed  BASIC METABOLIC PANEL - Abnormal; Notable for  the following:       Result Value   GFR calc non Af Amer 56 (*)    All other components within normal limits  CBC - Abnormal; Notable for the following:    RBC 4.21 (*)    Platelets 123 (*)    All other components within normal limits  BRAIN NATRIURETIC PEPTIDE  I-STAT TROPOININ, ED  I-STAT TROPOININ, ED    EKG  EKG Interpretation  Date/Time:  Thursday August 15 2016 17:39:04 EST Ventricular Rate:  100 PR Interval:    QRS Duration: 129 QT Interval:  368 QTC Calculation: 475 R Axis:   10 Text Interpretation:  Sinus tachycardia Probable left atrial enlargement Nonspecific intraventricular conduction delay Probable anteroseptal infarct, old Baseline wander in lead(s) V2 No significant change since last tracing Confirmed by Winfred Leeds  MD, SAM [phone]) on 08/15/2016 5:44:42 PM Also confirmed by Gerald Leitz ([id number])  on 08/15/2016 7:20:32 PM       Radiology Dg Chest 2 View  Result Date: 08/15/2016 CLINICAL DATA:  Shortness of breath EXAM: CHEST  2 VIEW COMPARISON:  07/31/2016, 07/21/2016, 09/25/2015 FINDINGS: Post sternotomy changes are again visualized. Scarring and pleural thickening at the right lung base appear unchanged. Interstitial opacities at the left lung base are also unchanged. No focal consolidation. Stable heart size. No pneumothorax. Degenerative changes of the spine. IMPRESSION: No active cardiopulmonary disease. Electronically Signed   By: Donavan Foil M.D.   On: 08/15/2016  18:39    Procedures Procedures (including critical care time)  Medications Ordered in ED Medications  predniSONE (DELTASONE) tablet 60 mg (60 mg Oral Given 08/15/16 1931)  ipratropium-albuterol (DUONEB) 0.5-2.5 (3) MG/3ML nebulizer solution 3 mL (3 mLs Nebulization Given 08/15/16 1934)     Initial Impression / Assessment and Plan / ED Course  I have reviewed the triage vital signs and the nursing notes.  Pertinent labs & imaging results that were available during my care of the patient were  reviewed by me and considered in my medical decision making (see chart for details).     Patient is a 76 yo with hisotyr of CHF and COPD still smoking here with SOB and CP for the last several days. Patient initially had wheezing. He is on his home oxygen. Never hypoxic or tachypneic. We treated him with DuoNeb, prednisone and azithro. This much improved his symptoms. We did a delta troponin given the patient had chest pain has history of CHF and CAD. However I do not think it's likely to due to cardiac ischemia given that has been going on for several days and associated with short of breath.   Patient feels much improved. His on his home oxygen. We'll have him follow-up with his cardiologist as planned. Final Clinical Impressions(s) / ED Diagnoses   Final diagnoses:  COPD exacerbation (HCC)    New Prescriptions New Prescriptions   AZITHROMYCIN (ZITHROMAX Z-PAK) 250 MG TABLET    Take 1 tablet (250 mg total) by mouth once. Take one daily for 4 days   PREDNISONE (DELTASONE) 20 MG TABLET    Day 1 and 2: Take 3 tabs  Day 3-5: Take 2 tabs.  Day 5-8: take 1 tab     Bayne Fosnaugh Julio Alm, MD 08/15/16 2254

## 2016-08-26 ENCOUNTER — Telehealth: Payer: Self-pay | Admitting: Pulmonary Disease

## 2016-08-26 NOTE — Telephone Encounter (Signed)
PCCs  Can we re-fax pt order for oxygen to the DME that is in original order. [facility] told the wife of the pt that they did not receive the order.

## 2016-08-26 NOTE — Telephone Encounter (Signed)
[facility] unable to service pt going to send over list of DME's that can.John Ball

## 2016-08-26 NOTE — Telephone Encounter (Signed)
refaxed to [facility] and got comformation it went through [employer]

## 2016-11-05 DIAGNOSIS — Z1389 Encounter for screening for other disorder: Secondary | ICD-10-CM | POA: Diagnosis not present

## 2016-11-05 DIAGNOSIS — E785 Hyperlipidemia, unspecified: Secondary | ICD-10-CM | POA: Diagnosis not present

## 2016-11-05 DIAGNOSIS — I739 Peripheral vascular disease, unspecified: Secondary | ICD-10-CM | POA: Diagnosis not present

## 2016-11-05 DIAGNOSIS — F339 Major depressive disorder, recurrent, unspecified: Secondary | ICD-10-CM | POA: Diagnosis not present

## 2016-11-05 DIAGNOSIS — Z125 Encounter for screening for malignant neoplasm of prostate: Secondary | ICD-10-CM | POA: Diagnosis not present

## 2016-11-05 DIAGNOSIS — I2510 Atherosclerotic heart disease of native coronary artery without angina pectoris: Secondary | ICD-10-CM | POA: Diagnosis not present

## 2016-11-05 DIAGNOSIS — Z0000 Encounter for general adult medical examination without abnormal findings: Secondary | ICD-10-CM | POA: Diagnosis not present

## 2016-11-05 DIAGNOSIS — I10 Essential (primary) hypertension: Secondary | ICD-10-CM | POA: Diagnosis not present

## 2016-11-05 DIAGNOSIS — E119 Type 2 diabetes mellitus without complications: Secondary | ICD-10-CM | POA: Diagnosis not present

## 2016-12-05 ENCOUNTER — Encounter: Payer: Self-pay | Admitting: Pulmonary Disease

## 2016-12-05 ENCOUNTER — Ambulatory Visit (INDEPENDENT_AMBULATORY_CARE_PROVIDER_SITE_OTHER): Payer: Medicare Other | Admitting: Pulmonary Disease

## 2016-12-05 VITALS — BP 118/60 | HR 73 | Ht 72.0 in | Wt 215.8 lb

## 2016-12-05 DIAGNOSIS — J432 Centrilobular emphysema: Secondary | ICD-10-CM

## 2016-12-05 DIAGNOSIS — Z720 Tobacco use: Secondary | ICD-10-CM | POA: Diagnosis not present

## 2016-12-05 DIAGNOSIS — J9611 Chronic respiratory failure with hypoxia: Secondary | ICD-10-CM

## 2016-12-05 MED ORDER — FLUTICASONE-UMECLIDIN-VILANT 100-62.5-25 MCG/INH IN AEPB: 1.0000 | INHALATION_SPRAY | Freq: Every day | RESPIRATORY_TRACT | 5 refills | Status: DC

## 2016-12-05 NOTE — Patient Instructions (Signed)
Trelegy 1 puff daily >> rinse mouth after each use  Stop advair, symbicort, and spiriva  Call in 2 weeks to update status after switch to trelegy  Will have you speak with patient care coordinators about getting a different home care company for supplemental oxygen  Follow up in 6 months

## 2016-12-05 NOTE — Progress Notes (Signed)
Current Outpatient Prescriptions on File Prior to Visit  Medication Sig  . albuterol (PROVENTIL HFA;VENTOLIN HFA) 108 (90 BASE) MCG/ACT inhaler Inhale 1 puff into the lungs every 6 (six) hours as needed for wheezing or shortness of breath.  Marland Kitchen albuterol (PROVENTIL) (5 MG/ML) 0.5% nebulizer solution Take 2.5 mg by nebulization every 6 (six) hours as needed for wheezing or shortness of breath.  . Cholecalciferol (VITAMIN D3) 1000 UNITS CAPS Take 1 capsule by mouth every morning.  . clonazePAM (KLONOPIN) 1 MG tablet Take 1 mg by mouth 2 (two) times daily as needed for anxiety.  . DULoxetine (CYMBALTA) 60 MG capsule Take by mouth. 60 mg tablet every morning and 30 mg tablet qhs  . gabapentin (NEURONTIN) 100 MG capsule Take 100 mg by mouth at bedtime.   . nitroGLYCERIN (NITROSTAT) 0.4 MG SL tablet Place 1 tablet (0.4 mg total) under the tongue every 5 (five) minutes as needed for chest pain.  . OXYGEN Inhale into the lungs at bedtime. Uses O2 -- intolerant to CPAP  . prazosin (MINIPRESS) 1 MG capsule Take 1 mg by mouth at bedtime.  Marland Kitchen QUEtiapine (SEROQUEL XR) 300 MG 24 hr tablet Take 300 mg by mouth at bedtime.   . ramipril (ALTACE) 10 MG capsule Take 5 mg by mouth every morning.   . traZODone (DESYREL) 150 MG tablet Take 150 mg by mouth at bedtime.  Marland Kitchen zolpidem (AMBIEN CR) 12.5 MG CR tablet Take 12.5 mg by mouth at bedtime.    Marland Kitchen dextromethorphan-guaiFENesin (MUCINEX DM) 30-600 MG 12hr tablet Take 1 tablet by mouth 2 (two) times daily. (Patient not taking: Reported on 12/05/2016)  . guaiFENesin (ROBITUSSIN) 100 MG/5ML SOLN Take 5 mLs (100 mg total) by mouth every 4 (four) hours as needed for cough or to loosen phlegm. (Patient not taking: Reported on 12/05/2016)   No current facility-administered medications on file prior to visit.      Chief Complaint  Patient presents with  . Follow-up    c/o increased sob x 2-3 wks. with humidity,occass. wheezing,cough-clear,denies cp or tightness    Pulmonary  tests CT chest 04/20/15 >> atherosclerosis, mild centrilobular/paraseptal emphysema, s/p RLL ectomy Spirometry 03/05/16 >> FEV1 1.21 (36%), FEV1% 54% ABG 03/07/16 >> pH 7.40, PaCO2 42.6, PaO2 59.3  Past medical history Anxiety, Depression, CAD s/p CABG, HTN, DVT, Panic attacks, HLD, Neuropathy, PTSD, Stage 1 NSCLC s/p Rt lower lobectomy 2012  Past surgical history, Family history, Social history, Allergies all reviewed.  Vital Signs BP 118/60 (BP Location: Left Arm, Cuff Size: Normal)   Pulse 73   Ht 6' (1.829 m)   Wt 215 lb 12.8 oz (97.9 kg)   SpO2 97%   BMI 29.27 kg/m   History of Present Illness John Ball is a 76 y.o. male smoker with COPD/emphysema/bronchitis, and chronic respiratory failure with hypoxia.  He still smokes 1/2 ppd.  He is confused about his inhalers.  Uses advair bid, but then symbicort prn.  Uses albuterol in between.  Uses spiriva daily.  Has cough with clear sputum.  Winded with activity and needs inhaler more when working outside.  Denies chest pain, fever, hemoptysis, gland swelling, or leg swelling.  Uses 2 liters oxygen 24/7.  He has trouble with his DME and can only get 2 tanks apparently.  As a result he as to go portions of the day w/o supplemental oxygen.  Physical Exam  General - pleasant, wearing oxygen Eyes - pupils reactive, wears glasses ENT - no sinus tenderness, no oral  exudate, no LAN Cardiac - regular, no murmur Chest - no wheeze, rales Abd - soft, non tender Ext - no edema Skin - no rashes Neuro - normal strength Psych - normal mood   Dg Chest 2 View  Result Date: 08/15/2016 CLINICAL DATA:  Shortness of breath EXAM: CHEST  2 VIEW COMPARISON:  07/31/2016, 07/21/2016, 09/25/2015 FINDINGS: Post sternotomy changes are again visualized. Scarring and pleural thickening at the right lung base appear unchanged. Interstitial opacities at the left lung base are also unchanged. No focal consolidation. Stable heart size. No pneumothorax.  Degenerative changes of the spine. IMPRESSION: No active cardiopulmonary disease. Electronically Signed   By: Donavan Foil M.D.   On: 08/15/2016 18:39    Assessment/Plan  COPD with emphysema and chronic bronchitis. - will change him to trelegy and prn albuterol - discussed roles of his inhalers and when to use them  Chronic respiratory failure with hypoxia. - he has PaO2 < 60 and chronic leg edema in setting of COPD - continue 2 liters 24/7 - will try to arrange for new DME >> not sure why Tira didn't go through  Tobacco abuse. - reviewed options to assist with smoking cessation  Time spent 26 minutes   Patient Instructions  Trelegy 1 puff daily >> rinse mouth after each use  Stop advair, symbicort, and spiriva  Call in 2 weeks to update status after switch to trelegy  Will have you speak with patient care coordinators about getting a different home care company for supplemental oxygen  Follow up in 6 months    Chesley Mires, MD [place] [facility] Pager:  [phone] 12/05/2016, 9:58 AM

## 2016-12-22 IMAGING — CR DG CHEST 2V
2 series · 2 of 2 positions shown · non-contrast
Comparison: 09/05/2015, 06/27/2011

CLINICAL DATA: Followup from 09/05/2015, with continued shortness
of breath, history of CABG and lung cancer

EXAM:
CHEST  2 VIEW

[w chest pa]
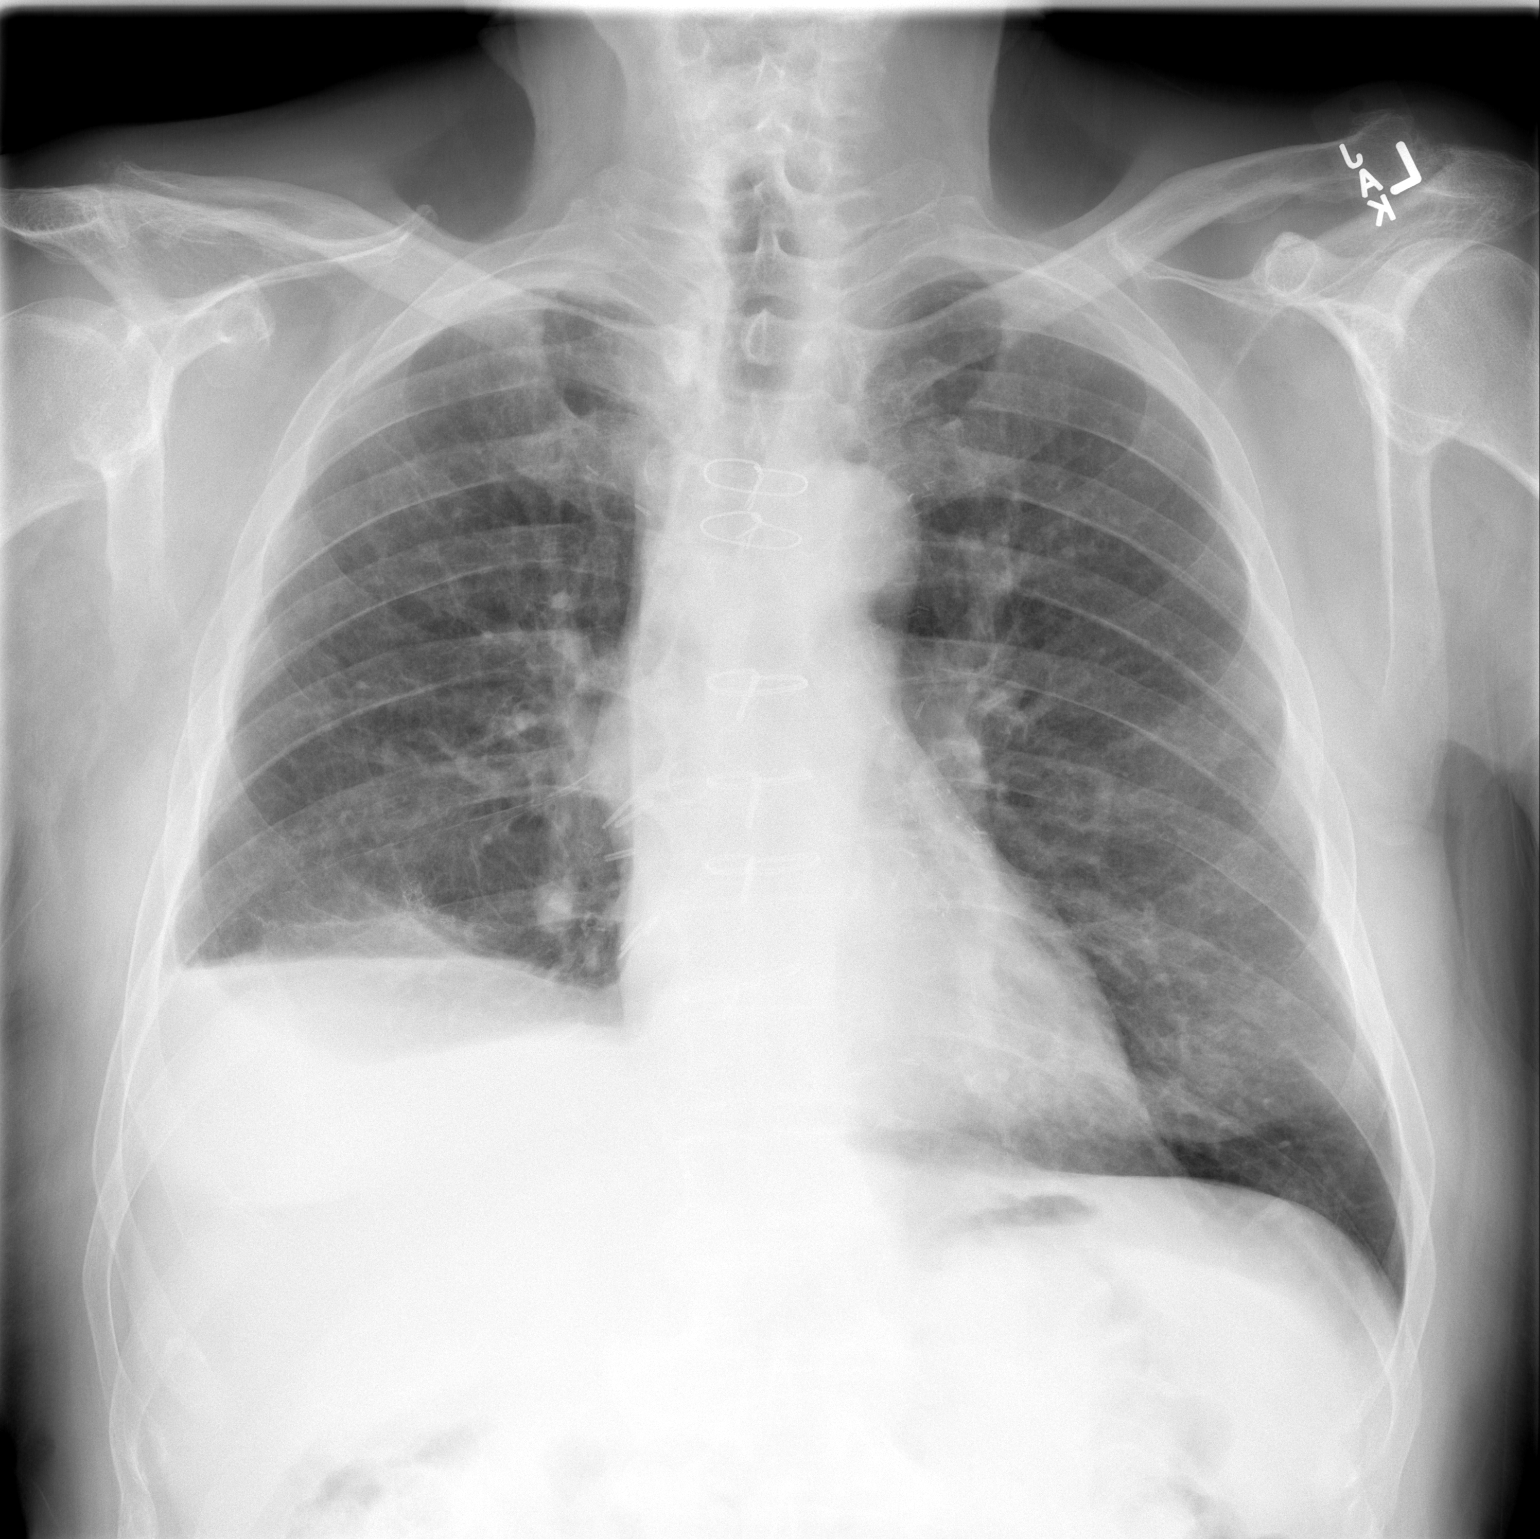

[w chest lat]
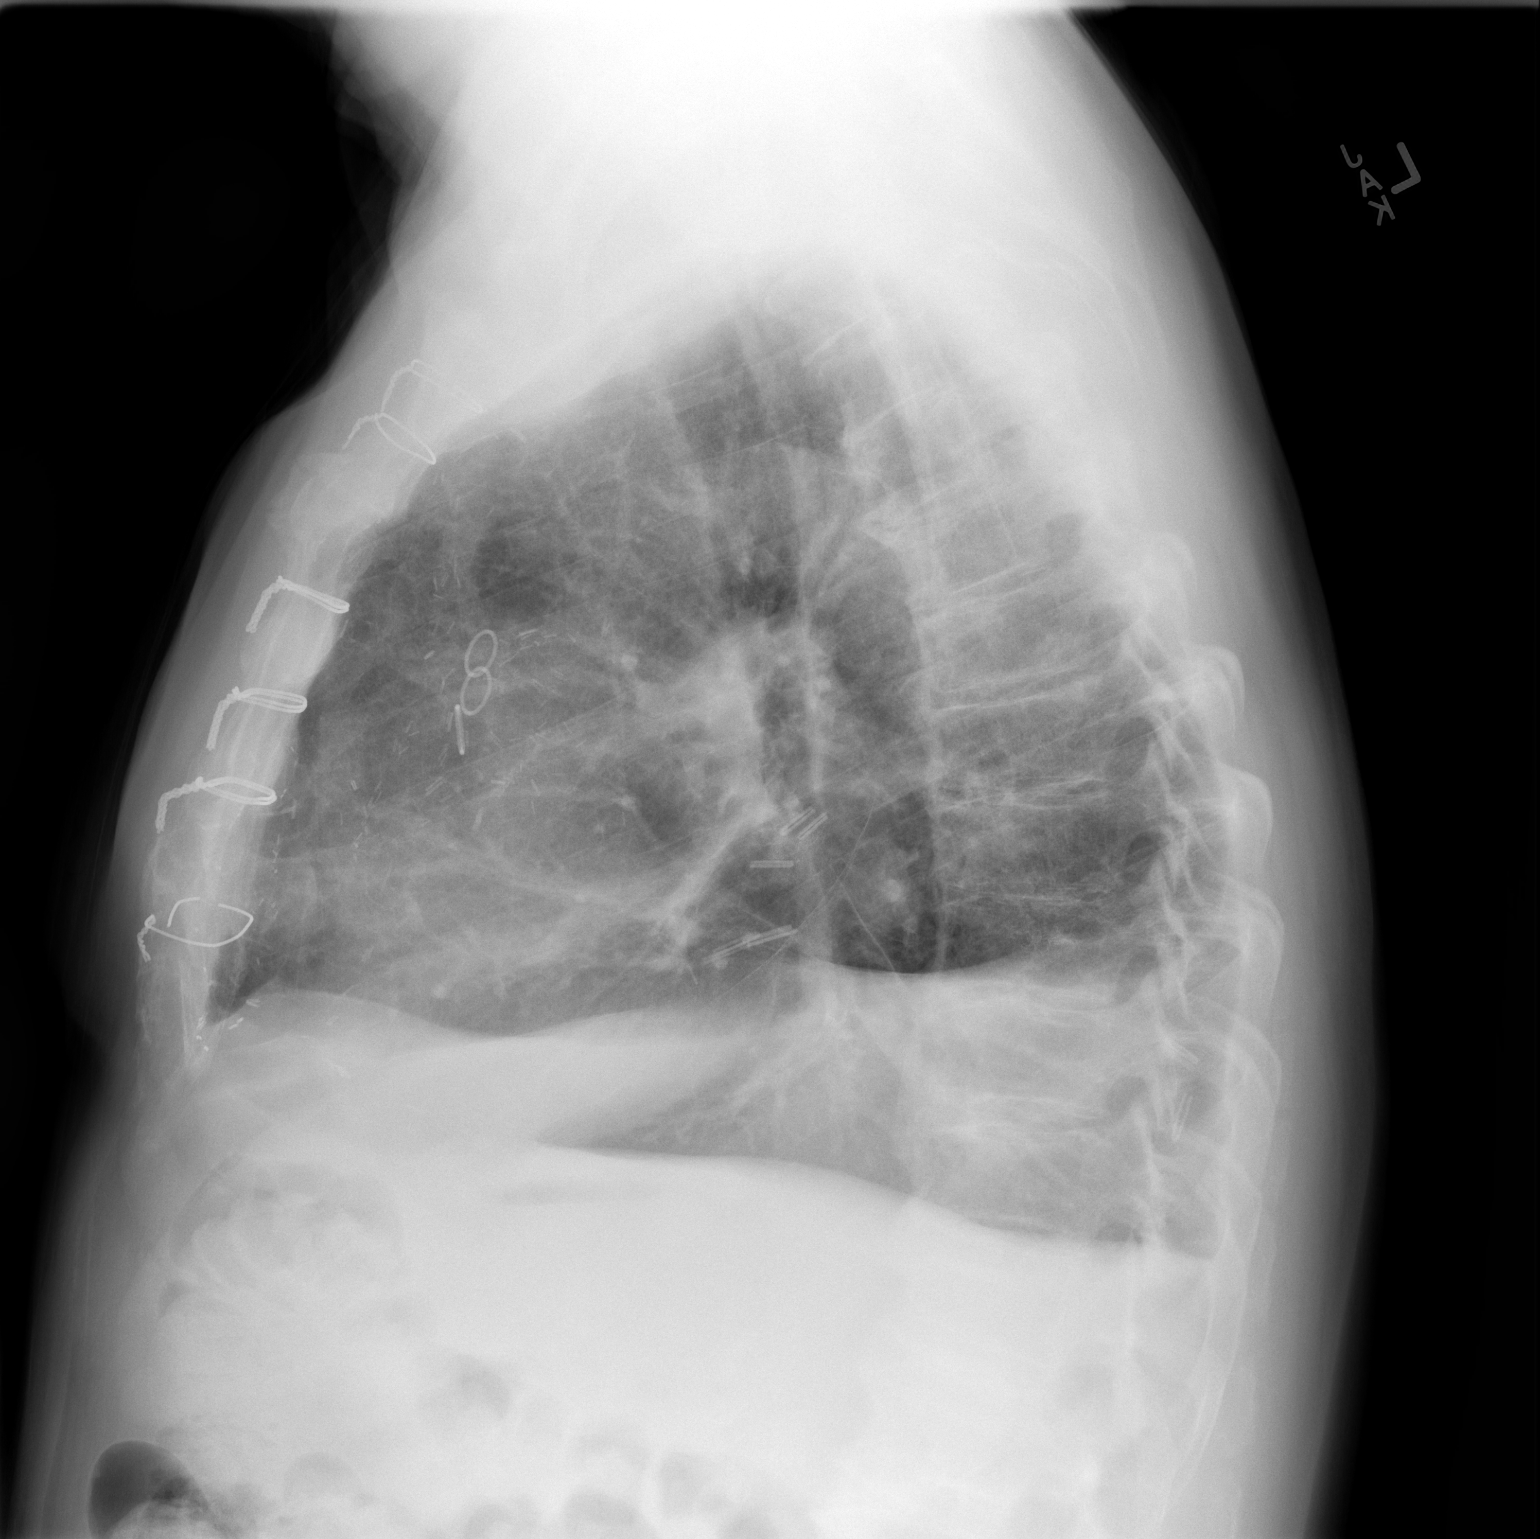

[2 of 2 positions shown; findings below may reference images not displayed]

FINDINGS: Status post right lower lobectomy. Chronic elevation of the right
diaphragm and opacity at the right lung base.

Heart size is normal. Status post CABG. Vascular pattern is normal.

There remains mild interstitial prominence, which is increased from
06/27/2011 but decreased from 09/05/2015.
IMPRESSION: Diffuse mild interstitial process improved from 09/05/2015. This
could represent improved interstitial edema or improving atypical
pneumonia. Would suggest continued follow-up to ensure complete
resolution relative to prior study performed 06/27/2011

## 2017-03-05 ENCOUNTER — Encounter: Payer: Self-pay | Admitting: Vascular Surgery

## 2017-03-12 DIAGNOSIS — I10 Essential (primary) hypertension: Secondary | ICD-10-CM | POA: Diagnosis not present

## 2017-03-12 DIAGNOSIS — I739 Peripheral vascular disease, unspecified: Secondary | ICD-10-CM | POA: Diagnosis not present

## 2017-03-12 DIAGNOSIS — J449 Chronic obstructive pulmonary disease, unspecified: Secondary | ICD-10-CM | POA: Diagnosis not present

## 2017-03-12 DIAGNOSIS — G894 Chronic pain syndrome: Secondary | ICD-10-CM | POA: Diagnosis not present

## 2017-03-12 DIAGNOSIS — Z23 Encounter for immunization: Secondary | ICD-10-CM | POA: Diagnosis not present

## 2017-03-13 ENCOUNTER — Ambulatory Visit (HOSPITAL_COMMUNITY)
Admission: RE | Admit: 2017-03-13 | Discharge: 2017-03-13 | Disposition: A | Discharge status: Home / Self Care | Payer: Medicare Other | Source: Ambulatory Visit | Attending: Family | Admitting: Family

## 2017-03-13 ENCOUNTER — Ambulatory Visit: Payer: Medicare Other

## 2017-03-13 DIAGNOSIS — I771 Stricture of artery: Secondary | ICD-10-CM | POA: Insufficient documentation

## 2017-03-13 DIAGNOSIS — Z95828 Presence of other vascular implants and grafts: Secondary | ICD-10-CM | POA: Diagnosis not present

## 2017-03-13 DIAGNOSIS — I779 Disorder of arteries and arterioles, unspecified: Secondary | ICD-10-CM

## 2017-03-13 DIAGNOSIS — R0989 Other specified symptoms and signs involving the circulatory and respiratory systems: Secondary | ICD-10-CM

## 2017-03-13 DIAGNOSIS — I498 Other specified cardiac arrhythmias: Secondary | ICD-10-CM | POA: Diagnosis not present

## 2017-05-07 ENCOUNTER — Ambulatory Visit (INDEPENDENT_AMBULATORY_CARE_PROVIDER_SITE_OTHER): Payer: Medicare Other | Admitting: Family Medicine

## 2017-05-07 ENCOUNTER — Encounter: Payer: Self-pay | Admitting: Family Medicine

## 2017-05-07 VITALS — BP 133/82 | HR 64 | Ht 72.0 in | Wt 212.0 lb

## 2017-05-07 DIAGNOSIS — I779 Disorder of arteries and arterioles, unspecified: Secondary | ICD-10-CM

## 2017-05-07 DIAGNOSIS — K08109 Complete loss of teeth, unspecified cause, unspecified class: Secondary | ICD-10-CM

## 2017-05-07 DIAGNOSIS — F17200 Nicotine dependence, unspecified, uncomplicated: Secondary | ICD-10-CM

## 2017-05-07 DIAGNOSIS — F319 Bipolar disorder, unspecified: Secondary | ICD-10-CM | POA: Diagnosis not present

## 2017-05-07 DIAGNOSIS — K000 Anodontia: Secondary | ICD-10-CM

## 2017-05-07 DIAGNOSIS — G4733 Obstructive sleep apnea (adult) (pediatric): Secondary | ICD-10-CM | POA: Diagnosis not present

## 2017-05-07 DIAGNOSIS — C3490 Malignant neoplasm of unspecified part of unspecified bronchus or lung: Secondary | ICD-10-CM

## 2017-05-07 DIAGNOSIS — I208 Other forms of angina pectoris: Secondary | ICD-10-CM

## 2017-05-07 DIAGNOSIS — I2510 Atherosclerotic heart disease of native coronary artery without angina pectoris: Secondary | ICD-10-CM | POA: Diagnosis not present

## 2017-05-07 DIAGNOSIS — F4310 Post-traumatic stress disorder, unspecified: Secondary | ICD-10-CM

## 2017-05-07 DIAGNOSIS — H9193 Unspecified hearing loss, bilateral: Secondary | ICD-10-CM

## 2017-05-07 DIAGNOSIS — E785 Hyperlipidemia, unspecified: Secondary | ICD-10-CM | POA: Diagnosis not present

## 2017-05-07 DIAGNOSIS — I10 Essential (primary) hypertension: Secondary | ICD-10-CM

## 2017-05-07 NOTE — Progress Notes (Signed)
New patient office visit note:  Impression and Recommendations:    1. Smoker unmotivated to quit   2. PTSD (post-traumatic stress disorder)   3. Malignant neoplasm of lung, unspecified laterality, unspecified part of lung ([place])   4. Left main coronary artery disease   5. Hyperlipidemia with target LDL less than 70   6. Essential hypertension   7. Bipolar I disorder ([place])   8. Atypical angina ([place])   9. OSA (obstructive sleep apnea)   10. No natural teeth   11. Bilateral hearing loss, unspecified hearing loss type     - gave pt handouts on smoking cessation  Since he recently had labs at the [place], please bring me those results once I review him if I feel he needs additional ones we will call you and have you come in for blood work.  For his psychiatry medicines including Seroquel, Cymbalta, clonazepam, Ambien, trazodone, these should come from his psychiatrist as these are psychiatric medicines that should be managed by that specialist.  When you need refills you just call the pharmacy for those refills.   - told to get me list of all specialists he sees/ all docs he sees.    - we discussed patient will continue to get medications from his specialist for his specialty conditions.  Patient needs to get me a list of all doctors.    -Since patient has a PCP as well as multiple specialist at the [place], he tells me he does not know when he will follow-up with me.  However explained with his multiple medical problems, I will need to see him every 3-4 months.  The patient was counseled, risk factors were discussed, anticipatory guidance given.   New Prescriptions   AMLODIPINE (NORVASC) 2.5 MG TABLET    Take 1 tablet (2.5 mg total) by mouth daily.    Discontinued Medications   DEXTROMETHORPHAN-GUAIFENESIN (MUCINEX DM) 30-600 MG 12HR TABLET    Take 1 tablet by mouth 2 (two) times daily.   FLUTICASONE-UMECLIDIN-VILANT (TRELEGY ELLIPTA) 100-62.5-25 MCG/INH AEPB    Inhale 1 puff into the  lungs daily.   GUAIFENESIN (ROBITUSSIN) 100 MG/5ML SOLN    Take 5 mLs (100 mg total) by mouth every 4 (four) hours as needed for cough or to loosen phlegm.   PRAZOSIN (MINIPRESS) 1 MG CAPSULE    Take 1 mg by mouth at bedtime.    Modified Medications   Modified Medication Previous Medication   NITROGLYCERIN (NITROSTAT) 0.4 MG SL TABLET nitroGLYCERIN (NITROSTAT) 0.4 MG SL tablet      Place 1 tablet (0.4 mg total) under the tongue every 5 (five) minutes as needed for chest pain.    Place 1 tablet (0.4 mg total) under the tongue every 5 (five) minutes as needed for chest pain.    Gross side effects, risk and benefits, and alternatives of medications discussed with patient.  Patient is aware that all medications have potential side effects and we are unable to predict every side effect or drug-drug interaction that may occur.  Expresses verbal understanding and consents to current therapy plan and treatment regimen.  Return in about 4 months (around 09/04/2017).  Please see AVS handed out to patient at the end of our visit for further patient instructions/ counseling done pertaining to today's office visit.    Note: This document was prepared using Dragon voice recognition software and may include unintentional dictation errors.  ----------------------------------------------------------------------------------------------------------------------    Subjective:    Chief complaint:   Chief  Complaint  Patient presents with  . Establish Care     HPI: John Ball is a pleasant 76 y.o. male who presents to [facility] today to review their medical history with me and establish care.   I asked the patient to review their chronic problem list with me to ensure everything was updated and accurate.    All recent office visits with other providers, any medical records that patient brought in etc  - I reviewed today.     Also asked pt to get me medical records  from [facility] providers/ specialists that they had seen within the past 3-5 years- if they are in private practice and/or do not work for a [employer], [facility], [facility], [facility] or [employer] owned practice.  Told them to call their specialists to clarify this if they are not sure.   Bipolar I -  [facility] Vikki Ports.  Patient has been on Seroquel for years, clonazepam, Cymbalta, Ambien, and trazodone for his PTSD and bipolar 1 disorder.  He gets these medicines from his psychiatrist.    Louisa Second at [facility] and Dr Halford Chessman-   Cards doc at [facility] and civilian  Dr Mary Sella- PCP at the [facility].   Dr Junious Silk- Urology.  Patient not sure why he sees him.   See problem list with detailed discussion of conditions under "overview".   Wt Readings from Last 3 Encounters:  08/28/17 210 lb (95.3 kg)  07/15/17 206 lb 4.8 oz (93.6 kg)  05/07/17 212 lb (96.2 kg)   BP Readings from Last 3 Encounters:  08/28/17 (!) 152/80  07/15/17 105/68  07/14/17 126/74   Pulse Readings from Last 3 Encounters:  08/28/17 74  07/15/17 89  07/14/17 90   BMI Readings from Last 3 Encounters:  08/28/17 28.48 kg/m  07/15/17 27.98 kg/m  05/07/17 28.75 kg/m    Patient Care Team    Relationship Specialty Notifications Start End  Mellody Dance, DO PCP - General Family Medicine  05/07/17     Patient Active Problem List   Diagnosis Date Noted  . Atypical angina ([place]) 08/28/2017    Priority: High  . COPD exacerbation ([place]) 07/21/2016    Priority: High  . Coronary artery disease involving coronary bypass graft of native heart with angina pectoris [facility])     Priority: High  . Peripheral arterial occlusive disease ([place])     Priority: High  . Smoker unmotivated to quit     Priority: High  . Essential hypertension     Priority: High  . 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 OSA (obstructive sleep apnea) 09/10/2017    Priority: Medium  . Bipolar I disorder ([place]) 05/07/2017    Priority: Medium  . Major depression,  recurrent ([place]) 07/21/2016    Priority: Medium  . Hyperlipidemia with target LDL less than 70     Priority: Medium  . Lung cancer ([place]) 05/15/2011    Priority: Medium  . PTSD (post-traumatic stress disorder) 09/10/2017    Priority: Low  . No natural teeth 09/10/2017    Priority: Low  . Hearing loss of both ears 09/10/2017    Priority: Low  . 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 Radial artery occlusion, left ([place]) 08/28/2017  . Acute on chronic respiratory failure with hypoxia ([place]) 07/21/2016  . CHF (congestive heart failure) ([place]) 07/21/2016  . Abnormal stress test   . Chest pain  at rest 06/06/2014  . COPD (chronic obstructive pulmonary disease) ([place])   . Pain of left lower extremity 02/01/2014  . Left main coronary artery disease 07/08/2002    Past Surgical History:  Procedure Laterality Date  . CARDIAC CATHETERIZATION  07-13-2002   Ischemia RCA region on Cardiolite// 60-70% ostial left main, 50% midCx, 60-70% mid RI,  70% mRCA, 90% JeNu and crux 75% RCA ,  95% PLA//  severe LM and 3Vessel CAD, perserved LV, ef 60%  . CARDIOVASCULAR STRESS TEST  last one  06-17-2014   dr  Ellyn Hack   Carlton Adam study with no exercise; Intermediate Risk Study;   moderate size and intensity, partially reversible inferior septal defect consistent with prior infarct and mild to moderate peri-infarct ischemia;  mild hypokinesis of the inferior septal wall,  normal LV function, ef 56%  . COLONOSCOPY  2008    WNL  . CORONARY ARTERY BYPASS GRAFT  07/27/2002   LIMA-LAD, freeRIMA-RI,SVG-OM2, SVG-rPDA; Dr. Roxan Hockey  . CYSTOSCOPY W/ URETERAL STENT PLACEMENT N/A 02/21/2015   Procedure: BILATERAL RETROGRADE PYELOGRAM;  Surgeon: Festus Aloe, MD;  Location: [facility];  Service: Urology;  Laterality: N/A;  . EYE SURGERY Left Nov 2013  . FEMORAL ARTERY - POPLITEAL ARTERY BYPASS GRAFT Right 10-24-2008   dr early   w/ GoreTex graft  . FEMORAL-TIBIAL BYPASS GRAFT Right  01/10/2014   Procedure: Right Femoral to Posterior Tibial Bypass Graft using Left Leg Vein, Thrombectomy Right Common Femoral and Profunda of Leg . ;  Surgeon: Rosetta Posner, MD;  Location: [facility];  Service: Vascular;  Laterality: Right;  . HYDROCELE EXCISION Left   . LEFT ANKLE SURGERY  1990's  . LEFT HEART CATHETERIZATION WITH CORONARY/GRAFT ANGIOGRAM N/A 07/12/2014   Procedure: LEFT HEART CATHETERIZATION WITH Beatrix Fetters;  Surgeon: Troy Sine, MD;  Location: [facility] CATH LAB;  Service: Cardiovascular;  Laterality: N/A;  pLM 70%, pLAD 40-50%- patent LIMA-LAD.  RI -competitive flow noted from fRIMA-RI graft, circumflex, normal caliber with moderate OM1.  OM 2 occluded.  SVG-OM 2 patent. RCA CTO after large RVM, faint R-R and LAD septal-PDA collateral  . NM MYOVIEW LTD  05/2014   Moderate sized, moderate intensity/partially reversible inferior defect consistent with prior infarct mild/moderate peri-infarct ischemia.  Inferoseptal HK.  INTERMEDIATE RISK. --> Cath showed CTO of Native RCA & SVG-rPDA.  Marland Kitchen RIGHT ABOVE KNEE POPLITEAL GRAFT TO BELOW KNEE POPLITEAL ARTERY BYPASS WITH REVERSE SAPHENOUS VEIN  03-02-2010  . THROMBECTOMY OF RIGHT FEMORAL TO ABOVE KNEE POPLITEAL GORETEX GRAFT ANGIOPLASTY OF GORETEX AND SAPHENOUS VEIN JUNCTION AND ABOVE KNEE POPLITEAL ARTERY   10-06-2010   DR EARLY  . TONSILLECTOMY    . TRANSURETHRAL RESECTION OF BLADDER TUMOR N/A 02/21/2015   Procedure: TRANSURETHRAL RESECTION OF BLADDER TUMOR (TURBT);  Surgeon: Festus Aloe, MD;  Location: [facility];  Service: Urology;  Laterality: N/A;  . VIDEO ASSISTED THORACOSCOPY (VATS)/WEDGE RESECTION Right 05-01-2011    FORSYTH   LOWER LOBECTOMY W/  NODE DISSECTION     Family History  Problem Relation Age of Onset  . Other Mother 34       Abdominal Aortic Aneurysm   . Stroke Mother   . Alzheimer's disease Father 59  . Stroke Father      Social History   Substance and Sexual Activity  Drug Use No      Social History   Substance and Sexual Activity  Alcohol Use No     Social History   Tobacco Use  Smoking  Status Current Every Day Smoker  . Packs/day: 0.50  . Years: 55.00  . Pack years: 27.50  . Types: Cigarettes  Smokeless Tobacco Never Used     Outpatient Encounter Medications as of 05/07/2017  Medication Sig Note  . ADVAIR DISKUS 500-50 MCG/DOSE AEPB  05/07/2017: Name brand only  . albuterol (PROVENTIL HFA;VENTOLIN HFA) 108 (90 BASE) MCG/ACT inhaler Inhale 1 puff into the lungs every 6 (six) hours as needed for wheezing or shortness of breath.   Marland Kitchen albuterol (PROVENTIL) (5 MG/ML) 0.5% nebulizer solution Take 2.5 mg by nebulization every 6 (six) hours as needed for wheezing or shortness of breath.   . Cholecalciferol (VITAMIN D3) 1000 UNITS CAPS Take 1 capsule by mouth every morning.   . clonazePAM (KLONOPIN) 1 MG tablet Take 1 mg by mouth 2 (two) times daily as needed for anxiety.   . DULoxetine (CYMBALTA) 60 MG capsule Take by mouth. 60 mg tablet every morning and 30 mg tablet qhs 05/07/2017: Brand name only   . gabapentin (NEURONTIN) 100 MG capsule Take 100 mg by mouth at bedtime.    . OXYGEN Inhale into the lungs at bedtime. Uses O2 -- intolerant to CPAP   . prazosin (MINIPRESS) 1 MG capsule Take 1 mg by mouth at bedtime.   Marland Kitchen QUEtiapine (SEROQUEL XR) 300 MG 24 hr tablet Take 300 mg by mouth at bedtime.  05/07/2017: Name brand only   . ramipril (ALTACE) 10 MG capsule Take 5 mg by mouth every morning.    . tiotropium (SPIRIVA HANDIHALER) 18 MCG inhalation capsule Place 18 mcg into inhaler and inhale daily. 05/07/2017: Name brand only   . traZODone (DESYREL) 150 MG tablet Take 150 mg by mouth at bedtime.   Marland Kitchen zolpidem (AMBIEN CR) 12.5 MG CR tablet Take 12.5 mg by mouth at bedtime.     . [DISCONTINUED] nitroGLYCERIN (NITROSTAT) 0.4 MG SL tablet Place 1 tablet (0.4 mg total) under the tongue every 5 (five) minutes as needed for chest pain.   . [DISCONTINUED]  dextromethorphan-guaiFENesin (MUCINEX DM) 30-600 MG 12hr tablet Take 1 tablet by mouth 2 (two) times daily. (Patient not taking: Reported on 12/05/2016)   . [DISCONTINUED] Fluticasone-Umeclidin-Vilant (TRELEGY ELLIPTA) 100-62.5-25 MCG/INH AEPB Inhale 1 puff into the lungs daily.   . [DISCONTINUED] guaiFENesin (ROBITUSSIN) 100 MG/5ML SOLN Take 5 mLs (100 mg total) by mouth every 4 (four) hours as needed for cough or to loosen phlegm. (Patient not taking: Reported on 12/05/2016)   . [DISCONTINUED] prazosin (MINIPRESS) 1 MG capsule Take 1 mg by mouth at bedtime.    No facility-administered encounter medications on file as of 05/07/2017.     Allergies: Aripiprazole; Aspirin; Crestor [rosuvastatin calcium]; Effexor [venlafaxine hydrochloride]; Lipitor [atorvastatin calcium]; Morphine and related; Zetia [ezetimibe]; and Zocor [simvastatin - high dose]  Patient filled out ROS sheet as part of new patient packet: Review of Systems  Constitutional: Negative for chills, diaphoresis, fever, malaise/fatigue and weight loss.  HENT: Negative for congestion, sore throat and tinnitus.   Eyes: Negative for blurred vision, double vision and photophobia.  Respiratory: Negative for cough and wheezing.   Cardiovascular: Negative for chest pain and palpitations.  Gastrointestinal: Negative for blood in stool, diarrhea, nausea and vomiting.  Genitourinary: Negative for dysuria, frequency and urgency.  Musculoskeletal: Negative for joint pain and myalgias.  Skin: Negative for itching and rash.  Neurological: Negative for dizziness, focal weakness, weakness and headaches.  Endo/Heme/Allergies: Negative for environmental allergies and polydipsia. Does not bruise/bleed easily.  Psychiatric/Behavioral: Negative for depression and memory  loss. The patient is not nervous/anxious and does not have insomnia.      Objective:   Blood pressure 133/82, pulse 64, height 6' (1.829 m), weight 212 lb (96.2 kg). Body mass index  is 28.75 kg/m. General: Well Developed, well nourished, and in no acute distress.  Neuro: Alert and oriented x3, extra-ocular muscles intact, sensation grossly intact.  HEENT:[place]/AT, PERRLA, neck supple, No carotid bruits Skin: no gross rashes  Cardiac: Regular rate and rhythm Respiratory: Essentially clear to auscultation bilaterally. Not using accessory muscles, speaking in full sentences.  Abdominal: not grossly distended Musculoskeletal: Ambulates w/o diff, FROM * 4 ext.  Vasc: less 2 sec cap RF, warm and pink  Psych:  No HI/SI, judgement and insight good, Euthymic mood. Full Affect.    No results found for this or any previous visit (from the past 2160 hour(s)).

## 2017-05-07 NOTE — Patient Instructions (Addendum)
Since he recently had labs at the [place], please bring me those results once I review him if I feel he needs additional ones we will call you and have you come in for blood work.  For his psychiatry medicines including Seroquel, Cymbalta, clonazepam, Ambien, trazodone, these should come from his psychiatrist as these are psychiatric medicines that should be managed by that specialist.  When you need refills you just call the pharmacy for those refills.   - told to get me list of all specialists he sees/ all docs he sees.    - we discussed patient will continue to get medications from his specialist for his specialty conditions.  Patient needs to get me a list of all doctors.    -Since patient has a PCP as well as multiple specialist at the [place], he tells me he does not know when he will follow-up with me.  However explained with his multiple medical problems, I will need to see him every 3-4 months.

## 2017-06-16 ENCOUNTER — Ambulatory Visit: Payer: Medicare Other | Admitting: Pulmonary Disease

## 2017-06-17 ENCOUNTER — Ambulatory Visit: Payer: Medicare Other | Admitting: Pulmonary Disease

## 2017-07-14 ENCOUNTER — Telehealth: Payer: Self-pay | Admitting: *Deleted

## 2017-07-14 ENCOUNTER — Encounter: Payer: Self-pay | Admitting: Pulmonary Disease

## 2017-07-14 ENCOUNTER — Ambulatory Visit (INDEPENDENT_AMBULATORY_CARE_PROVIDER_SITE_OTHER): Payer: Medicare Other | Admitting: Pulmonary Disease

## 2017-07-14 VITALS — BP 126/74 | HR 90

## 2017-07-14 DIAGNOSIS — J4489 Other specified chronic obstructive pulmonary disease: Secondary | ICD-10-CM

## 2017-07-14 DIAGNOSIS — J432 Centrilobular emphysema: Secondary | ICD-10-CM | POA: Diagnosis not present

## 2017-07-14 DIAGNOSIS — Z720 Tobacco use: Secondary | ICD-10-CM

## 2017-07-14 DIAGNOSIS — J449 Chronic obstructive pulmonary disease, unspecified: Secondary | ICD-10-CM | POA: Diagnosis not present

## 2017-07-14 DIAGNOSIS — J9611 Chronic respiratory failure with hypoxia: Secondary | ICD-10-CM | POA: Diagnosis not present

## 2017-07-14 NOTE — Progress Notes (Signed)
[facility]  Chief Complaint  Patient presents with  . Follow-up    Pt breathing worsen, SOB with exertion worsen in last 3 weeks. Pt's wife states O2 sats drop more with exertion and moving. Pt on 2 liters O2 [facility]. Pt had chest pain and tightness with left arm pain since Saturday.    Vital signs: BP 126/74 (BP Location: Left Arm, Cuff Size: Normal)   Pulse 90   SpO2 100%   History of Present Illness: John Ball is a 77 y.o. male smoker with COPD with emphysema and chronic bronchitis, and chronic respiratory failure with hypoxia.  He has hx of NSCLC s/p Rt lower lobectomy.  He continues to smoke.  He gets episodes of arm cramping and pain.  This is mostly in his left arm.  He also gets similar feelings in his legs Lt > Rt.  These happen when he walks and significantly limit his activity.  He gets short of breath after walking about 100 feet.  He has to stop to rest after walking this far.  He has cough with clear sputum.  He was told that he should only use spiriva in an emergency.  He has been using advair.    Physical Exam:  General - pleasant Eyes - pupils reactive, wears glasses ENT - no sinus tenderness, no oral exudate, no LAN Cardiac - regular, no murmur Chest - decreased breath sounds, prolonged exhalation, no wheeze Abd - soft, non tender Ext - no edema Skin - yellow discoloration on fingers Neuro - normal strength Psych - normal mood  Ambulatory oximetry on 2 liters today >> maintained SpO2 above 99% after 3 laps.  Assessment/Plan:  COPD with emphysema and chronic bronchitis. - discussed proper role of his inhalers - continue spiriva daily and advair bid - albuterol prn - he will check with PCP about whether he already received pneumovax  Chronic respiratory failure with hypoxia. - he has PaO2 < 60 and chronic leg edema in setting of COPD - continue 2 liters oxygen 24/7  Tobacco abuse. - discussed options to  assist with smoking cessation - explained how continued tobacco abuse is likely contributing to his claudication symptoms - he will think about his options  Peripheral artery disease. - he appears to be having claudication - advised him to follow up with vascular surgery   Patient Instructions  Use spiriva one puff daily Use advair one puff in the morning and one puff at night [facility] you already received the pneumonia 23 vaccine  Follow up in 6 months  Time spent 29 minutes.  Chesley Mires, MD [facility] 07/14/2017, 9:41 AM Pager:  [phone]  Flow Sheet  Pulmonary tests: CT chest 04/20/15 >> atherosclerosis, mild centrilobular/paraseptal emphysema, s/p RLL ectomy Spirometry 03/05/16 >> FEV1 1.21 (36%), FEV1% 54% ABG 03/07/16 >> pH 7.40, PaCO2 42.6, PaO2 59.3  Events:  Past Medical History: He  has a past medical history of Anxiety, Arthritis, CAD (coronary artery disease), Complication of anesthesia, COPD, severe ([place]), Depression, Essential hypertension, Hearing loss of both ears, History of arterial bypass of lower extremity, History of CHF (congestive heart failure), History of DVT of lower extremity, History of panic attacks, Hyperlipidemia with target LDL less than 70, Left main coronary artery disease (Jan 2004), Neuropathy, peripheral, 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  Past Surgical History: He  has a past surgical history that includes Femoral-tibial  Bypass Graft (Right, 01/10/2014); left heart catheterization with coronary/graft angiogram (N/A, 07/12/2014); Coronary artery bypass graft (07-27-2002  dr Roxan Hockey); Cardiovascular stress test (last one  06-17-2014   dr  harding); Tonsillectomy; Femoral artery - popliteal artery bypass graft (Right, 10-24-2008   dr early); RIGHT ABOVE KNEE POPLITEAL GRAFT TO BELOW KNEE POPLITEAL ARTERY BYPASS WITH REVERSE SAPHENOUS VEIN (03-02-2010); THROMBECTOMY OF RIGHT FEMORAL TO ABOVE KNEE POPLITEAL GORETEX GRAFT ANGIOPLASTY OF GORETEX AND SAPHENOUS VEIN JUNCTION AND ABOVE KNEE POPLITEAL ARTERY  (10-06-2010   DR EARLY); Video assisted thoracoscopy (vats)/wedge resection (Right, 05-01-2011    FORSYTH); Cardiac catheterization (07-13-2002); LEFT ANKLE SURGERY (1990's); Hydrocele surgery (Left); Eye surgery (Left, Nov 2013); Colonoscopy (2008    WNL); Transurethral resection of bladder tumor (N/A, 02/21/2015); and Cystoscopy w/ ureteral stent placement (N/A, 02/21/2015).  Family History: His family history includes Alzheimer's disease (age of onset: 4) in his father; Other (age of onset: 74) in his mother; Stroke in his father and mother.  Social History: He  reports that he has been smoking cigarettes.  He has a 27.50 pack-year smoking history. he has never used smokeless tobacco. He reports that he does not drink alcohol or use drugs.  Medications: Allergies as of 07/14/2017      Reactions   Aripiprazole Other (See Comments)   "makes him wild"   Aspirin Other (See Comments)   Avoids -- bruises real bad   Crestor [rosuvastatin Calcium] Other (See Comments)   myalgia   Effexor [venlafaxine Hydrochloride] Itching   Lipitor [atorvastatin Calcium] Other (See Comments)   myalgia   Morphine And Related Itching   Zetia [ezetimibe] Other (See Comments)   myalgia   Zocor [simvastatin - High Dose] Other (See Comments)   myalgia      Medication List        Accurate as of 07/14/17  9:41 AM. Always use your most recent  med list.          ADVAIR DISKUS 500-50 MCG/DOSE Aepb Generic drug:  Fluticasone-Salmeterol   albuterol (5 MG/ML) 0.5% nebulizer solution Commonly known as:  PROVENTIL Take 2.5 mg by nebulization every 6 (six) hours as needed for wheezing or shortness of breath.   albuterol 108 (90 Base) MCG/ACT inhaler Commonly known as:  PROVENTIL HFA;VENTOLIN HFA Inhale 1 puff into the lungs every 6 (six) hours as needed for wheezing or shortness of breath.   AMBIEN CR 12.5 MG CR tablet Generic drug:  zolpidem Take 12.5 mg by mouth at bedtime.   clonazePAM 1 MG tablet Commonly known as:  KLONOPIN Take 1 mg by mouth 2 (two) times daily as needed for anxiety.   DULoxetine 60 MG capsule Commonly known as:  CYMBALTA Take by mouth. 60 mg tablet every morning and 30 mg tablet qhs   gabapentin 100 MG capsule Commonly known as:  NEURONTIN Take 100 mg by mouth at bedtime.   nitroGLYCERIN 0.4 MG SL tablet Commonly known as:  NITROSTAT Place 1 tablet (0.4 mg total) under the tongue every 5 (five) minutes as needed for chest pain.   OXYGEN Inhale into the lungs at bedtime. Uses O2 -- intolerant  to CPAP   prazosin 1 MG capsule Commonly known as:  MINIPRESS Take 1 mg by mouth at bedtime.   QUEtiapine 300 MG 24 hr tablet Commonly known as:  SEROQUEL XR Take 300 mg by mouth at bedtime.   ramipril 10 MG capsule Commonly known as:  ALTACE Take 5 mg by mouth every morning.   SPIRIVA HANDIHALER 18 MCG inhalation capsule Generic drug:  tiotropium Place 18 mcg into inhaler and inhale daily.   traZODone 150 MG tablet Commonly known as:  DESYREL Take 150 mg by mouth at bedtime.   Vitamin D3 1000 units Caps Take 1 capsule by mouth every morning.

## 2017-07-14 NOTE — Telephone Encounter (Signed)
Family for this patient to states patient has been having right foot and LLE pain past walking short distances. Has also had some pain in his right arm off and on over the past week. Denies any signs or symptoms of stroke or MI when questioned. States colo and temp is good and equal in both feet, and pulses where checked by his lung doctor today. He is not taking anything for this pain . Appointment give for NP on 07/15/17.

## 2017-07-14 NOTE — Patient Instructions (Addendum)
Use spiriva one puff daily Use advair one puff in the morning and one puff at night [facility] you already received the pneumonia 23 vaccine  Follow up in 6 months

## 2017-07-15 ENCOUNTER — Ambulatory Visit (INDEPENDENT_AMBULATORY_CARE_PROVIDER_SITE_OTHER): Payer: Medicare Other | Admitting: Family

## 2017-07-15 ENCOUNTER — Encounter: Payer: Self-pay | Admitting: Family

## 2017-07-15 VITALS — BP 105/68 | HR 89 | Temp 97.7°F | Resp 20 | Ht 72.0 in | Wt 206.3 lb

## 2017-07-15 DIAGNOSIS — Z95828 Presence of other vascular implants and grafts: Secondary | ICD-10-CM

## 2017-07-15 DIAGNOSIS — F17200 Nicotine dependence, unspecified, uncomplicated: Secondary | ICD-10-CM

## 2017-07-15 DIAGNOSIS — I779 Disorder of arteries and arterioles, unspecified: Secondary | ICD-10-CM | POA: Diagnosis not present

## 2017-07-15 DIAGNOSIS — R0989 Other specified symptoms and signs involving the circulatory and respiratory systems: Secondary | ICD-10-CM

## 2017-07-15 DIAGNOSIS — I498 Other specified cardiac arrhythmias: Secondary | ICD-10-CM | POA: Diagnosis not present

## 2017-07-15 NOTE — Patient Instructions (Signed)
Steps to Quit Smoking Smoking tobacco can be bad for your health. It can also affect almost every organ in your body. Smoking puts you and people around you at risk for many serious long-lasting (chronic) diseases. Quitting smoking is hard, but it is one of the best things that you can do for your health. It is never too late to quit. What are the benefits of quitting smoking? When you quit smoking, you lower your risk for getting serious diseases and conditions. They can include:  Lung cancer or lung disease.  Heart disease.  Stroke.  Heart attack.  Not being able to have children (infertility).  Weak bones (osteoporosis) and broken bones (fractures).  If you have coughing, wheezing, and shortness of breath, those symptoms may get better when you quit. You may also get sick less often. If you are pregnant, quitting smoking can help to lower your chances of having a baby of low birth weight. What can I do to help me quit smoking? Talk with your doctor about what can help you quit smoking. Some things you can do (strategies) include:  Quitting smoking totally, instead of slowly cutting back how much you smoke over a period of time.  Going to in-person counseling. You are more likely to quit if you go to many counseling sessions.  Using resources and support systems, such as: ? Online chats with a counselor. ? Phone quitlines. ? Printed self-help materials. ? Support groups or group counseling. ? Text messaging programs. ? Mobile phone apps or applications.  Taking medicines. Some of these medicines may have nicotine in them. If you are pregnant or breastfeeding, do not take any medicines to quit smoking unless your doctor says it is okay. Talk with your doctor about counseling or other things that can help you.  Talk with your doctor about using more than one strategy at the same time, such as taking medicines while you are also going to in-person counseling. This can help make  quitting easier. What things can I do to make it easier to quit? Quitting smoking might feel very hard at first, but there is a lot that you can do to make it easier. Take these steps:  Talk to your family and friends. Ask them to support and encourage you.  Call phone quitlines, reach out to support groups, or work with a counselor.  Ask people who smoke to not smoke around you.  Avoid places that make you want (trigger) to smoke, such as: ? Bars. ? Parties. ? Smoke-break areas at work.  Spend time with people who do not smoke.  Lower the stress in your life. Stress can make you want to smoke. Try these things to help your stress: ? Getting regular exercise. ? Deep-breathing exercises. ? Yoga. ? Meditating. ? Doing a body scan. To do this, close your eyes, focus on one area of your body at a time from head to toe, and notice which parts of your body are tense. Try to relax the muscles in those areas.  Download or buy apps on your mobile phone or tablet that can help you stick to your quit plan. There are many free apps, such as QuitGuide from the CDC (Centers for Disease Control and Prevention). You can find more support from smokefree.gov and other websites.  This information is not intended to replace advice given to you by your health care provider. Make sure you discuss any questions you have with your health care provider. Document Released: 04/20/2009 Document   Revised: 02/20/2016 Document Reviewed: 11/08/2014 Elsevier Interactive Patient Education  2018 [employer].     Peripheral Vascular Disease Peripheral vascular disease (PVD) is a disease of the blood vessels that are not part of your heart and brain. A simple term for PVD is poor circulation. In most cases, PVD narrows the blood vessels that carry blood from your heart to the rest of your body. This can result in a decreased supply of blood to your arms, legs, and internal organs, like your stomach or kidneys.  However, it most often affects a person's lower legs and feet. There are two types of PVD.  Organic PVD. This is the more common type. It is caused by damage to the structure of blood vessels.  Functional PVD. This is caused by conditions that make blood vessels contract and tighten (spasm).  Without treatment, PVD tends to get worse over time. PVD can also lead to acute ischemic limb. This is when an arm or limb suddenly has trouble getting enough blood. This is a medical emergency. Follow these instructions at home:  Take medicines only as told by your doctor.  Do not use any tobacco products, including cigarettes, chewing tobacco, or electronic cigarettes. If you need help quitting, ask your doctor.  Lose weight if you are overweight, and maintain a healthy weight as told by your doctor.  Eat a diet that is low in fat and cholesterol. If you need help, ask your doctor.  Exercise regularly. Ask your doctor for some good activities for you.  Take good care of your feet. ? Wear comfortable shoes that fit well. ? Check your feet often for any cuts or sores. Contact a doctor if:  You have cramps in your legs while walking.  You have leg pain when you are at rest.  You have coldness in a leg or foot.  Your skin changes.  You are unable to get or have an erection (erectile dysfunction).  You have cuts or sores on your feet that are not healing. Get help right away if:  Your arm or leg turns cold and blue.  Your arms or legs become red, warm, swollen, painful, or numb.  You have chest pain or trouble breathing.  You suddenly have weakness in your face, arm, or leg.  You become very confused or you cannot speak.  You suddenly have a very bad headache.  You suddenly cannot see. This information is not intended to replace advice given to you by your health care provider. Make sure you discuss any questions you have with your health care provider. Document Released:  09/18/2009 Document Revised: 11/30/2015 Document Reviewed: 12/02/2013 Elsevier Interactive Patient Education  2017 [employer] Inc.  

## 2017-07-15 NOTE — Progress Notes (Signed)
[facility]    CC: 2-3 year hx of right arm pain, left lower leg claudication, chronic dyspnea with COPD, anxiety  History of Present Illness John Ball is a 77 y.o. male patient of Dr. Donnetta Hutching who is status post extensive prior femoropopliteal bypasses on his right leg. He had initially had a right femoral to above-knee popliteal bypass in April 2010. This was followed by right above-knee to below-knee popliteal bypass in August of 2011. He presented with occlusion of this and underwent thrombectomy and revision of his femoral-popliteal bypass in April of 2012.  He is also s/p right common femoral and profundus femoris artery thrombectomy and endarterectomy; right femoral to posterior tibial bypass with saphenous vein harvested from left leg on 01/10/14. He has a history of DVT.  He returns today with c/o 2-3 year hx of right foot cramping, right arm pain for about a year, left lower leg pain after walking about 75 feet, relieved by sitting, Pt states he informed his PCP about these sx's, states he was told "that's part of getting old".   Wife states pt has a lot of anxiety, "it don't take much for him to get in a tizzy", states he has PTSD.  She also states his cardiologist at the [facility] checked him recently and states "his heart was fine".  Wife states pt saw his pulmonologist yesterday, she states there is no change in his lung status, but that "he is full of anxiety".    His walking is also limited by his dyspnea, has severe COPD. He has a history of lung cancer with right lobectomy.  He denies any history of stroke or TIA, denies history of MI, but did have a CABG.   He had a bladder lesion --removed in the Summer of 2016, states not cancer  Pt Diabetic: No Pt smoker: smoker (1/2 ppd, started at age 35 yrs)  Pt meds include: Statin :No, statins caused itching ASA: No, record indicates that ASA caused GI bleed, but wife states this is not the case, that  he bruised easily taking 81 mg ASA daily Other anticoagulants/antiplatelets: no     Past Medical History:  Diagnosis Date  . Anxiety   . Arthritis   . CAD (coronary artery disease)    cardiologist-  dr kelly/ dr berry  ----  s/p  cabg x4 in 2004  w/ severe LM disease  . Complication of anesthesia    hx prolonged post op oxygen dependent/  hx hallucinations for 2 day post op lobectomy 2012  . COPD, severe ([place])   . Depression   . Essential hypertension   . Hearing loss of both ears    does wear his hearing aids  . History of arterial bypass of lower extremity    RIGHT FEM-POP  . History of CHF (congestive heart failure)   . History of DVT of lower extremity   . History of panic attacks   . Hyperlipidemia with target LDL less than 70   . Left main coronary artery disease Jan 2004   a) Abn Myoview (RCA Ischemia) --> CATH: Ostial LM 60-70%, 50% mCx, 60-70% mid RI, 70-90-75% m-d RCA w/ 95% PLS --> CABG; b) Echo 8/'07: Normal LV thickness and function, mild LV dilation. Essentially normal echo  . Neuropathy, peripheral   . 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 Nocturia   . OSA (obstructive sleep apnea)    uses O2  via [place]  --  intolerate to CPAP  . Peripheral arterial occlusive disease ([place]) followed by dr Early--  per last dopplers 06-28-2014 no change right graft,  50-74% stenosis common and left mid superficial femoral artery   2011   a) Gore-Tex graft right AK popA-BK popA --> b) 2012: thrombosed graft --> thrombectomy with dacron patch = 1 V runnof via Peroneal; c) 01/2014: R femoropopliteal bypass with left femoral vein  . PTSD (post-traumatic stress disorder)    anxiety attack's---  [place] [employer]  . Recurrent productive cough    SMOKER'S COUGH  . 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 Diaphragmatic attenuation  . Smoker unmotivated to quit    Reportedly he came close to having a  nervous breakdown when he last tried to quit  . 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 Wears glasses     Social History Social History   Tobacco Use  . Smoking status: Current Every Day Smoker    Packs/day: 0.50    Years: 55.00    Pack years: 27.50    Types: Cigarettes  . Smokeless tobacco: Never Used  Substance Use Topics  . Alcohol use: No  . Drug use: No    Family History Family History  Problem Relation Age of Onset  . Other Mother 58       Abdominal Aortic Aneurysm   . Stroke Mother   . Alzheimer's disease Father 52  . Stroke Father     Past Surgical History:  Procedure Laterality Date  . CARDIAC CATHETERIZATION  07-13-2002   Ischemia RCA region on Cardiolite// 60-70% ostial left main, 50% midCx, 60-70% mid RI,  70% mRCA, 90% JeNu and crux 75% RCA ,  95% PLA//  severe LM and 3Vessel CAD, perserved LV, ef 60%  . CARDIOVASCULAR STRESS TEST  last one  06-17-2014   dr  Ellyn Hack   [place] Adam study with no exercise; Intermediate Risk Study;   moderate size and intensity, partially reversible inferior septal defect consistent with prior infarct and mild to moderate peri-infarct ischemia;  mild hypokinesis of the inferior septal wall,  normal LV function, ef 56%  . COLONOSCOPY  2008    WNL  . CORONARY ARTERY BYPASS GRAFT  07-27-2002  dr Malvin Johns, freeRIMA-RI,SVG-OM2, SVG-rPDA  . CYSTOSCOPY W/ URETERAL STENT PLACEMENT N/A 02/21/2015   Procedure: BILATERAL RETROGRADE PYELOGRAM;  Surgeon: Festus Aloe, MD;  Location: [facility];  Service: Urology;  Laterality: N/A;  . EYE SURGERY Left Nov 2013  . FEMORAL ARTERY - POPLITEAL ARTERY BYPASS GRAFT Right 10-24-2008   dr early   w/ GoreTex graft  . FEMORAL-TIBIAL BYPASS GRAFT Right 01/10/2014   Procedure: Right Femoral to Posterior Tibial Bypass Graft using Left Leg Vein, Thrombectomy Right Common Femoral and Profunda of Leg . ;  Surgeon: Rosetta Posner, MD;  Location: [facility];  Service: Vascular;  Laterality: Right;  . HYDROCELE EXCISION Left   . LEFT ANKLE SURGERY  1990's  . LEFT HEART CATHETERIZATION WITH CORONARY/GRAFT ANGIOGRAM N/A 07/12/2014   Procedure: LEFT HEART CATHETERIZATION WITH Beatrix Fetters;  Surgeon: Troy Sine, MD;  Location: [facility] C[facility] LAB;  Service: Cardiovascular;  Laterality: N/A;  mild LV dysfunction,  ef 45-50% , mild inferior hypocontractility; sig. native coronary obstuctive disease with left to right predominant septal collateralization;  occluded SVG to RCA graft , other graft's patent  . RIGHT ABOVE KNEE POPLITEAL GRAFT TO BELOW KNEE POPLITEAL ARTERY BYPASS WITH REVERSE SAPHENOUS VEIN  03-02-2010  . THROMBECTOMY OF RIGHT FEMORAL TO ABOVE KNEE POPLITEAL GORETEX GRAFT ANGIOPLASTY OF GORETEX AND SAPHENOUS VEIN JUNCTION AND ABOVE KNEE POPLITEAL ARTERY   10-06-2010   DR EARLY  . TONSILLECTOMY    . TRANSURETHRAL RESECTION OF BLADDER TUMOR N/A 02/21/2015   Procedure: TRANSURETHRAL RESECTION OF BLADDER TUMOR (TURBT);  Surgeon: Festus Aloe, MD;  Location: [facility];  Service: Urology;  Laterality: N/A;  . VIDEO ASSISTED THORACOSCOPY (VATS)/WEDGE RESECTION Right 05-01-2011    FORSYTH   LOWER LOBECTOMY W/  NODE DISSECTION    Allergies  Allergen Reactions  . Aripiprazole Other (See Comments)    "makes him wild"  . Aspirin Other (See Comments)    Avoids -- bruises real bad  . Crestor [Rosuvastatin Calcium] Other (See Comments)    myalgia  . Effexor [Venlafaxine Hydrochloride]  Itching  . Lipitor [Atorvastatin Calcium] Other (See Comments)    myalgia  . Morphine And Related Itching  . Zetia [Ezetimibe] Other (See Comments)    myalgia  . Zocor [Simvastatin - High Dose] Other (See Comments)    myalgia    Current Outpatient Medications  Medication Sig Dispense Refill  . ADVAIR DISKUS 500-50 MCG/DOSE AEPB     . albuterol (PROVENTIL HFA;VENTOLIN HFA) 108 (90 BASE) MCG/ACT inhaler Inhale 1 puff into the lungs every 6 (six) hours as needed for wheezing or shortness of breath.    Marland Kitchen albuterol (PROVENTIL) (5 MG/ML) 0.5% nebulizer solution Take 2.5 mg by nebulization every 6 (six) hours as needed for wheezing or shortness of breath.    . Cholecalciferol (VITAMIN D3) 1000 UNITS CAPS Take 1 capsule by mouth every morning.    . clonazePAM (KLONOPIN) 1 MG tablet Take 1 mg by mouth 2 (two) times daily as needed for anxiety.    . DULoxetine (CYMBALTA) 60 MG capsule Take by mouth. 60 mg tablet every morning and 30 mg tablet qhs    . gabapentin (NEURONTIN) 100 MG capsule Take 100 mg by mouth at bedtime.     . nitroGLYCERIN (NITROSTAT) 0.4 MG SL tablet Place 1 tablet (0.4 mg total) under the tongue every 5 (five) minutes as needed for chest pain. 25 tablet 6  . OXYGEN Inhale into the lungs at bedtime. Uses O2 -- intolerant to CPAP    . prazosin (MINIPRESS) 1 MG capsule Take 1 mg by mouth at bedtime.    Marland Kitchen QUEtiapine (SEROQUEL XR) 300 MG 24 hr tablet Take 300 mg by mouth at bedtime.     . ramipril (ALTACE) 10 MG capsule Take 5 mg by mouth every morning.     . tiotropium (SPIRIVA HANDIHALER) 18 MCG inhalation capsule Place 18 mcg into inhaler and inhale daily.    . traZODone (DESYREL) 150 MG tablet Take 150 mg by mouth at bedtime.    Marland Kitchen zolpidem (AMBIEN CR) 12.5 MG CR tablet Take 12.5 mg by mouth at bedtime.  No current facility-administered medications for this visit.     ROS: See HPI for pertinent positives and negatives.   Physical Examination  Vitals:   07/15/17 0836   BP: 105/68  Pulse: 89  Resp: 20  Temp: 97.7 F (36.5 C)  TempSrc: Oral  SpO2: 97%  Weight: 206 lb 4.8 oz (93.6 kg)  Height: 6' (1.829 m)   Body mass index is 27.98 kg/m.  General: A&O x 3, WDWN male. Gait: normal Eyes: PERRLA Pulmonary: limited air movement in all fields, no rales, rhonchi, or wheezing. He has pursed lips expirations at rest, is dyspneic with walking.  Cardiac: regular rhythm and rate, no detected murmur.     Carotid Bruits Left Right   Negative Negative   Abdominal aortic pulse is not palpable. Radial pulses: are: 2+ right, non palpable left radial pulse, 2+ palpable left brachial pulse   VASCULAR EXAM: Extremitieswithout ischemic changes  without Gangrene. Without open wounds.      LE Pulses LEFT RIGHT   FEMORAL  not palpable, + Doppler signal 2+ palpable    POPLITEAL Not palpable  2+palpableat bypass, medial to knee   POSTERIOR TIBIAL Not palpable, + Doppler signal  1+ palpable    DORSALIS PEDIS  ANTERIOR TIBIAL monophasic by Doppler and not palpable   monophasic by Doppler and not palpable    Abdomen: soft, NT, no palpable masses. Skin: no rashes, no ulcers, see extremities. Musculoskeletal: no muscle wasting or atrophy. Neurologic: A&O X 3; Appropriate Affect,; MOTOR FUNCTION: moving all extremities equally, motor strength 5/5 throughout. Speech is fluent/normal. CN 2-12 intact except he is hard of hearing. Psychiatric: Asleep when not speaking to him. Wife states he mistakenly took his sleeping meds this morning.      ASSESSMENT: JENNER ROSIER is a 77 y.o. male who is status post extensive prior femoropopliteal bypasses on his right leg. He had initially had a right femoral to  above-knee popliteal bypass in April 2010. This was followed by right above-knee to below-knee popliteal bypass in August of 2011. He presented with occlusion of this and underwent thrombectomy and revision of his femoral-popliteal bypass in April of 2012.  He is also s/p right common femoral and profundus femoris artery thrombectomy and endarterectomy; right femoral to posterior tibial bypass with saphenous vein harvested from left leg on 01/10/14.  He has pain in his left lower leg after walking about 75 feet, relieved by sitting, no signs of ischemia in his lower extremities. His walking is limited by dyspnea, apparently from severe COPD; also has a history of lung cancer with right lobectomy. Non palpable left femoral artery has remained present.   Wife states pt saw his pulmonologist yesterday, she states there is no change in his lung status, but that "he is full of anxiety".   I discussed with Dr. Donnetta Hutching pt HPI and physical exam results.  DATA  None today.   Right LE Arterial Duplex (03-13-17): Right femorotibial bypass graft with no evidence of restenosis. 50-74% stenosis of the native left common femoral artery inflow proximal to the endarterectomy site (273 cm/s) (plaque visualized).  No significant change compared to to the exam on 03-07-16.   ABI (Date: 03-13-17):  R:   ABI: 1.15 (was 0.95 on 03-07-16),   PT: tri  DP: bi  TBI:  0.57  L:   ABI: 0.71 (was 0.80),   PT: mono  DP: mono  TBI: 0.26 Stable and normal ABI on the right with tri and biphasic waveforms, slight  decline on the left to moderate disease with monophasic waveforms.    PLAN:  The patient was counseled re smoking cessation and given several free resources re smoking cessation.   Based on the patient's vascular studies and examination, pt will return to clinic in 3 months with ABI's, and right LE arterial duplex.  I discussed in depth with the patient the nature of atherosclerosis, and emphasized the  importance of maximal medical management including strict control of blood pressure, blood glucose, and lipid levels, obtaining regular exercise, and cessation of smoking.  The patient is aware that without maximal medical management the underlying atherosclerotic disease process will progress, limiting the benefit of any interventions.  The patient was given information about PAD including signs, symptoms, treatment, what symptoms should prompt the patient to seek immediate medical care, and risk reduction measures to take.  Clemon Chambers, RN, MSN, FNP-C [facility] [employer] Phone: [phone]  Clinic MD: Early  07/15/17 11:42 AM

## 2017-07-16 NOTE — Addendum Note (Signed)
Addended by: Lianne Cure A on: 07/16/2017 11:02 AM   Modules accepted: Orders

## 2017-08-11 ENCOUNTER — Telehealth: Payer: Self-pay | Admitting: Family Medicine

## 2017-08-11 NOTE — Telephone Encounter (Signed)
Called and spoke to the patient's wife and she states that the medications have been sent to the mail order. MPulliam, CMA/RT(R)

## 2017-08-11 NOTE — Telephone Encounter (Signed)
Pt's wife states she wants Dr. Raliegh Scarlet to continue Rx (originally prescribed by pt's psychiatrist who doesn't not have electronic capability to transmit Rx to Mail order pharmacy---- Advised would forward message to provider for review & some one would be contacting her back@ [phone]  QUEtiapine (SEROQUEL XR) 300 MG 24 hr tablet [[id number]]  Order Details  Dose: 300 mg Route: Oral Frequency: Daily at bedtime  Dispense Quantity: -- Refills: -- Fills remaining: --        Sig: Take 300 mg by mouth at bedtime     &   DULoxetine (CYMBALTA) 60 MG capsule [[id number]]  Order Details  Dose: -- Route: Oral Frequency: --  Dispense Quantity: -- Refills: -- Fills remaining: --        Sig: Take by mouth. 60 mg tablet every morning and 30 mg tablet qh     Pt uses:  Preferred Pharmacies      [facility] [place], [place] [phone] (Phone) [phone] (Fax)   --glh

## 2017-08-28 ENCOUNTER — Encounter: Payer: Self-pay | Admitting: Cardiology

## 2017-08-28 ENCOUNTER — Ambulatory Visit (INDEPENDENT_AMBULATORY_CARE_PROVIDER_SITE_OTHER): Payer: Medicare Other | Admitting: Cardiology

## 2017-08-28 VITALS — BP 152/80 | HR 74 | Ht 72.0 in | Wt 210.0 lb

## 2017-08-28 DIAGNOSIS — I779 Disorder of arteries and arterioles, unspecified: Secondary | ICD-10-CM

## 2017-08-28 DIAGNOSIS — Z951 Presence of aortocoronary bypass graft: Secondary | ICD-10-CM | POA: Diagnosis not present

## 2017-08-28 DIAGNOSIS — I2510 Atherosclerotic heart disease of native coronary artery without angina pectoris: Secondary | ICD-10-CM

## 2017-08-28 DIAGNOSIS — R079 Chest pain, unspecified: Secondary | ICD-10-CM | POA: Diagnosis not present

## 2017-08-28 DIAGNOSIS — I25709 Atherosclerosis of coronary artery bypass graft(s), unspecified, with unspecified angina pectoris: Secondary | ICD-10-CM | POA: Diagnosis not present

## 2017-08-28 DIAGNOSIS — F17200 Nicotine dependence, unspecified, uncomplicated: Secondary | ICD-10-CM

## 2017-08-28 DIAGNOSIS — I70208 Unspecified atherosclerosis of native arteries of extremities, other extremity: Secondary | ICD-10-CM | POA: Diagnosis not present

## 2017-08-28 DIAGNOSIS — E785 Hyperlipidemia, unspecified: Secondary | ICD-10-CM

## 2017-08-28 DIAGNOSIS — I5030 Unspecified diastolic (congestive) heart failure: Secondary | ICD-10-CM

## 2017-08-28 DIAGNOSIS — I25810 Atherosclerosis of coronary artery bypass graft(s) without angina pectoris: Secondary | ICD-10-CM

## 2017-08-28 DIAGNOSIS — I10 Essential (primary) hypertension: Secondary | ICD-10-CM

## 2017-08-28 DIAGNOSIS — I2089 Other forms of angina pectoris: Secondary | ICD-10-CM

## 2017-08-28 DIAGNOSIS — I739 Peripheral vascular disease, unspecified: Secondary | ICD-10-CM

## 2017-08-28 DIAGNOSIS — I208 Other forms of angina pectoris: Secondary | ICD-10-CM | POA: Diagnosis not present

## 2017-08-28 MED ORDER — AMLODIPINE BESYLATE 2.5 MG PO TABS: 2.5000 mg | ORAL_TABLET | Freq: Every day | ORAL | 4 refills | Status: DC

## 2017-08-28 MED ORDER — NITROGLYCERIN 0.4 MG SL SUBL: 0.4000 mg | SUBLINGUAL_TABLET | SUBLINGUAL | 6 refills | Status: AC | PRN

## 2017-08-28 NOTE — Progress Notes (Signed)
PCP: Mellody Dance, DO  Clinic Note: Chief Complaint  Patient presents with  . Follow-up    Significant anginal symptoms  . Coronary Artery Disease    HPI: John Ball is a 77 y.o. male with a PMH below who presents today after last being seen in July 2016.  He has a history of CAD-CABG, hypertension, hyperlipidemia and long-standing smoking history with COPD. Prior to my seeing him in July 2016, he was seen by Dr. Gwenlyn Found as well as Dr. Debara Pickett. He is also followed by vascular surgery for PAD   He had a Myoview stress test in August 2011 that was negative for ischemia.  In November 2015 he had some chest discomfort and was evaluated with a nuclear stress test read as "intermediate risk ". ->    Cardiac catheterization January 2016 revealed occluded native RCA and occluded vein graft RCA.  No PCI.  Recommended medical management.  John Ball was last seen on July 2016.  No major complaints.  No active cardiac complaints.  Baseline exertional dyspnea from COPD.  Not interested in smoking cessation.  Able to achieve at least 6 METS.    Recent Hospitalizations:   ER 08/15/2016 ~ [facility] : Admitted with several days of worsening shortness of chest pain.  Had significant wheezing.  Treated with nebs, prednisone and azithromycin with significant improvement.  Chest discomfort was thought to be musculoskeletal  Studies Personally Reviewed - (if available, images/films reviewed: From Epic Chart or Care Everywhere)  Lower extremity arterial Dopplers/ABIs March 13, 2017: Right ABI 1.15 with no evidence of arterial disease.  Left ABI shows moderate occlusive disease -0.71  Interval History: Williom returns today with progressively worsening dyspnea and chest pain.  Since he left the hospital, some of the coughing is improved, but he still notes having intermittent episodes of chest pain and exertional dyspnea.  He has had chest pain which have been sharp in nature but also some  tightness.  When he has his chest pain he usually takes for aspirins and it tends to go away.  He also notes having exertional discomfort in both his legs.  When I asked him which one bothers him more dyspnea on legs or chest pain, he says it usually is either the dyspnea or chest pain that hurts more than the legs. He has resting dyspnea, but that is baseline.  He does not really have PND orthopnea.  He has frequent nocturia because of his history of prostate surgery.  No significant edema.  He denies any rapid irregular heartbeats or palpitations.  No syncope/near syncope. No TIA/amaurosis fugax symptoms. No melena, hematochezia, hematuria, or epstaxis. ++ claudication.  ROS: A comprehensive was performed. Review of Systems  Constitutional: Positive for malaise/fatigue. Negative for chills and fever.  HENT: Negative for congestion and nosebleeds.   Respiratory: Positive for cough (Morning cough.  Also now throughout the day), shortness of breath and wheezing. Negative for sputum production (No longer as productive as it was in the ER).   Cardiovascular: Positive for chest pain.  Gastrointestinal: Negative for abdominal pain, blood in stool, constipation and melena.  Genitourinary: Negative for hematuria.  Musculoskeletal: Positive for joint pain.  Neurological: Positive for tingling (He has some polyneuropathy). Negative for dizziness.  Endo/Heme/Allergies: Negative for environmental allergies.  Psychiatric/Behavioral: Positive for memory loss. Negative for depression. The patient is not nervous/anxious.   All other systems reviewed and are negative.  I have reviewed and (if needed) personally updated the patient's problem  list, medications, allergies, past medical and surgical history, social and family history.   Past Medical History:  Diagnosis Date  . Anxiety   . Arthritis   . Complication of anesthesia    hx prolonged post op oxygen dependent/  hx hallucinations for 2 day post op  lobectomy 2012  . COPD, severe ([place])   . Depression   . Essential hypertension   . Hearing loss of both ears    does wear his hearing aids  . History of arterial bypass of lower extremity    RIGHT FEM-POP  . History of CHF (congestive heart failure)   . History of DVT of lower extremity   . History of panic attacks   . Hyperlipidemia with target LDL less than 70   . Left main coronary artery disease 2004   a) Severe LM CAD 2004 -->s/p  cabg x4 (LIMA-LAD, free RIMA-RI, SVG-OM 2, SVG-RPDA); b) 06/2014: Abnormal Myoview --> c)cardiac Cath Jan 2016: pLM 70%, pLAD 40-50%- patent LIMA-LAD.  RI -competitive flow noted from RIMA-RI graft, circumflex, normal caliber with moderate OM1.  OM 2 occluded.  SVG-OM 2 patent. RCA CTO after large RV M, faint R-R and LAD septal-PDA collaterals =--> new  . Neuropathy, peripheral   . 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 Nocturia   . OSA (obstructive sleep apnea)    uses O2  via   --  intolerate to CPAP  . Peripheral arterial occlusive disease ([place]) followed by dr Early--  per last dopplers 06-28-2014 no change right graft,  50-74% stenosis common and left mid superficial femoral artery   2011   a) Gore-Tex graft right AK popA-BK popA --> b) 2012: thrombosed graft --> thrombectomy with dacron patch = 1 V runnof via Peroneal; c) 01/2014: R femoropopliteal bypass with left femoral vein  . PTSD (post-traumatic stress disorder)    anxiety attack's---  [place] [employer]  . Recurrent productive cough    SMOKER'S COUGH  . 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  Medical management  . Smoker unmotivated to quit    Reportedly he came close to having a nervous breakdown when he last tried to quit  . 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 Wears glasses     Past Surgical History:  Procedure Laterality Date  . CARDIAC CATHETERIZATION  07-13-2002   Ischemia RCA region on Cardiolite// 60-70% ostial left main, 50% midCx, 60-70% mid RI,  70% mRCA, 90% JeNu and crux 75% RCA ,  95% PLA//  severe LM and 3Vessel CAD, perserved LV, ef 60%  . CARDIOVASCULAR STRESS TEST  last one  06-17-2014   dr  Ellyn Hack   Carl[place] Adam study with no exercise; Intermediate Risk Study;   moderate size and intensity, partially reversible  inferior septal defect consistent with prior infarct and mild to moderate peri-infarct ischemia;  mild hypokinesis of the inferior septal wall,  normal LV function, ef 56%  . COLONOSCOPY  2008    WNL  . CORONARY ARTERY BYPASS GRAFT  07/27/2002   LIMA-LAD, freeRIMA-RI,SVG-OM2, SVG-rPDA; Dr. Roxan Hockey  . CYSTOSCOPY W/ URETERAL STENT PLACEMENT N/A 02/21/2015   Procedure: BILATERAL RETROGRADE PYELOGRAM;  Surgeon: Festus Aloe, MD;  Location: [facility];  Service: Urology;  Laterality: N/A;  . EYE SURGERY Left Nov 2013  . FEMORAL ARTERY - POPLITEAL ARTERY BYPASS GRAFT Right 10-24-2008   dr early   w/ GoreTex graft  . FEMORAL-TIBIAL BYPASS GRAFT Right 01/10/2014   Procedure: Right Femoral to Posterior Tibial Bypass Graft using Left Leg Vein, Thrombectomy Right Common Femoral and Profunda of Leg . ;  Surgeon: Rosetta Posner, MD;  Location: [place];  Service: Vascular;  Laterality: Right;  . HYDROCELE EXCISION Left   . LEFT ANKLE SURGERY  1990's  . LEFT HEART CATHETERIZATION WITH CORONARY/GRAFT ANGIOGRAM N/A  07/12/2014   Procedure: LEFT HEART CATHETERIZATION WITH Beatrix Fetters;  Surgeon: Troy Sine, MD;  Location[facility] CATH LAB;  Service: Cardiovascular;  Laterality: N/A;  pLM 70%, pLAD 40-50%- patent LIMA-LAD.  RI -competitive flow noted from fRIMA-RI graft, circumflex, normal caliber with moderate OM1.  OM 2 occluded.  SVG-OM 2 patent. RCA CTO after large RVM, faint R-R and LAD septal-PDA collateral  . NM MYOVIEW LTD  05/2014   Moderate sized, moderate intensity/partially reversible inferior defect consistent with prior infarct mild/moderate peri-infarct ischemia.  Inferoseptal HK.  INTERMEDIATE RISK. --> Cath showed CTO of Native RCA & SVG-rPDA.  Marland Kitchen RIGHT ABOVE KNEE POPLITEAL GRAFT TO BELOW KNEE POPLITEAL ARTERY BYPASS WITH REVERSE SAPHENOUS VEIN  03-02-2010  . THROMBECTOMY OF RIGHT FEMORAL TO ABOVE KNEE POPLITEAL GORETEX GRAFT ANGIOPLASTY OF GORETEX AND SAPHENOUS VEIN JUNCTION AND ABOVE KNEE POPLITEAL ARTERY   10-06-2010   DR EARLY  . TONSILLECTOMY    . TRANSURETHRAL RESECTION OF BLADDER TUMOR N/A 02/21/2015   Procedure: TRANSURETHRAL RESECTION OF BLADDER TUMOR (TURBT);  Surgeon: Festus Aloe, MD;  Location: [facility]  Service: Urology;  Laterality: N/A;  . VIDEO ASSISTED THORACOSCOPY (VATS)/WEDGE RESECTION Right 05-01-2011    FORSYTH   LOWER LOBECTOMY W/  NODE DISSECTION    Cardiac Catheterization 07/12/2014: ? pLM 70%  LAD: Proximal 40-50; competitive filling from a patent LIMA-LAD graft  RI: Competitive flow from widely patent free RIMA-RI graft  Cx: Normal caliber, moderate OM1. OM 2 not visualized.  SVG-OM 2 widely patent ? RCA: 100% CTO after large RVM  -- Faint R-R and LAD septal-the PDA collaterals    SVG-RCA 100% CTO  LVEF 4550% with mid-basal inferior HK  Medical management as the identified lesion correlated with stress test   Current Meds  Medication Sig  . ADVAIR DISKUS 500-50 MCG/DOSE AEPB   . albuterol (PROVENTIL HFA;VENTOLIN HFA) 108  (90 BASE) MCG/ACT inhaler Inhale 1 puff into the lungs every 6 (six) hours as needed for wheezing or shortness of breath.  Marland Kitchen albuterol (PROVENTIL) (5 MG/ML) 0.5% nebulizer solution Take 2.5 mg by nebulization every 6 (six) hours as needed for wheezing or shortness of breath.  . Cholecalciferol (VITAMIN D3) 1000 UNITS CAPS Take 1 capsule by mouth every morning.  . clonazePAM (KLONOPIN) 1 MG tablet Take 1 mg by mouth 2 (two) times daily as needed for anxiety.  . DULoxetine (CYMBALTA) 60 MG capsule Take by mouth. 60 mg tablet  every morning and 30 mg tablet qhs  . gabapentin (NEURONTIN) 100 MG capsule Take 100 mg by mouth at bedtime.   . nitroGLYCERIN (NITROSTAT) 0.4 MG SL tablet Place 1 tablet (0.4 mg total) under the tongue every 5 (five) minutes as needed for chest pain.  . OXYGEN Inhale into the lungs at bedtime. Uses O2 -- intolerant to CPAP  . prazosin (MINIPRESS) 1 MG capsule Take 1 mg by mouth at bedtime.  Marland Kitchen QUEtiapine (SEROQUEL XR) 300 MG 24 hr tablet Take 300 mg by mouth at bedtime.   . ramipril (ALTACE) 10 MG capsule Take 5 mg by mouth every morning.   . tiotropium (SPIRIVA HANDIHALER) 18 MCG inhalation capsule Place 18 mcg into inhaler and inhale daily.  . traZODone (DESYREL) 150 MG tablet Take 150 mg by mouth at bedtime.  Marland Kitchen zolpidem (AMBIEN CR) 12.5 MG CR tablet Take 12.5 mg by mouth at bedtime.    . [DISCONTINUED] nitroGLYCERIN (NITROSTAT) 0.4 MG SL tablet Place 1 tablet (0.4 mg total) under the tongue every 5 (five) minutes as needed for chest pain.    Allergies  Allergen Reactions  . Aripiprazole Other (See Comments)    "makes him wild"  . Aspirin Other (See Comments)    Avoids -- bruises real bad  . Crestor [Rosuvastatin Calcium] Other (See Comments)    myalgia  . Effexor [Venlafaxine Hydrochloride] Itching  . Lipitor [Atorvastatin Calcium] Other (See Comments)    myalgia  . Morphine And Related Itching  . Zetia [Ezetimibe] Other (See Comments)    myalgia  . Zocor  [Simvastatin - High Dose] Other (See Comments)    myalgia    Social History   Tobacco Use  . Smoking status: Current Every Day Smoker    Packs/day: 0.50    Years: 55.00    Pack years: 27.50    Types: Cigarettes  . Smokeless tobacco: Never Used  Substance Use Topics  . Alcohol use: No  . Drug use: No   Social History   Social History Narrative   He is married with 2 children. Does not exercise regularly.   He and his wife have been recently living in [place], [place] (but just moved back to [place], [place] in November).   He still smokes about a half-pack a day and is not interested in quitting. No alcohol or recreational drug use.    family history includes Alzheimer's disease (age of onset: 63) in his father; Other (age of onset: 63) in his mother; Stroke in his father and mother.  Wt Readings from Last 3 Encounters:  08/28/17 210 lb (95.3 kg)  07/15/17 206 lb 4.8 oz (93.6 kg)  05/07/17 212 lb (96.2 kg)    PHYSICAL EXAM BP (!) 152/80   Pulse 74   Ht 6' (1.829 m)   Wt 210 lb (95.3 kg)   BMI 28.48 kg/m  Physical Exam  Constitutional: He is oriented to person, place, and time. He appears well-developed and well-nourished.  Appears older than stated age.  No acute distress.  Well-groomed.  Smells like cigarette smoke.  Baseline accessory muscle use for breathing.  HENT:  Head: Normocephalic and atraumatic.  Very poor teeth  Eyes: EOM are normal. Pupils are equal, round, and reactive to light.  Neck: Neck supple. No JVD present. No tracheal deviation present.  Cardiovascular: Regular rhythm, S1 normal, S2 normal and normal heart sounds.  Occasional extrasystoles are present. PMI is not displaced. Exam reveals decreased pulses. Exam reveals no gallop and no midsystolic  click.  Significantly minimize pulses in the left wrist.  Barely palpable.  (CABG listed RIMA and LIMA, but did not mention use of radial artery. Concerning disparity between blood pressure cuff, will check    Pulmonary/Chest: Effort normal. No respiratory distress. He has wheezes. He has no rales (No rales, but definite diffuse rhonchi and interstitial sounds.  Diffusely abnormal lung exam.).  Abdominal: Soft. Bowel sounds are normal. He exhibits no distension. There is no tenderness. There is no rebound.  Musculoskeletal: Normal range of motion. He exhibits edema (Trivial).  Neurological: He is alert and oriented to person, place, and time. No cranial nerve deficit. Coordination normal.  Skin: Skin is warm and dry.  Psychiatric: He has a normal mood and affect. Judgment and thought content normal.   He still remains stubborn but has relatively normal thought content if not poor judgment.     Adult ECG Report  Rate: 74 ;  Rhythm: normal sinus rhythm, premature atrial contractions (PAC) and Nonspecific ST and T wave changes.  Otherwise normal EKG;   Narrative Interpretation: Stable EKG   Other studies Reviewed: Additional studies/ records that were reviewed today include:  Recent Labs: Reviewed Lab Results  Component Value Date   CREATININE 1.23 08/15/2016   BUN 19 08/15/2016   NA 139 08/15/2016   K 3.8 08/15/2016   CL 104 08/15/2016   CO2 26 08/15/2016   Lab Results  Component Value Date   CHOL 186 07/22/2016   HDL 38 (L) 07/22/2016   LDLCALC 132 (H) 07/22/2016   TRIG 78 07/22/2016   CHOLHDL 4.9 07/22/2016     ASSESSMENT / PLAN: Problem List Items Addressed This Visit    Atypical angina ([place])    He is having symptoms of chest discomfort that are not completely consistent with angina, but there are some similarities.  It is similar to back when he had issues with his coronaries before, but last evaluation of his cath did not show any new findings besides occluded native RCA.  Plan: Recommend that he uses nitroglycerin in addition to asked her chest pain will add amlodipine for antianginal effect.  If necessary we could have him stop his ACE inhibitor. No beta-blocker because of  his COPD.      Relevant Medications   nitroGLYCERIN (NITROSTAT) 0.4 MG SL tablet   amLODipine (NORVASC) 2.5 MG tablet   Other Relevant Orders   EKG 12-Lead (Completed)   MYOCARDIAL PERFUSION IMAGING   Chest pain at rest    I do think that some of his chest pain is musculoskeletal in nature cough, but the part that is associate with exertional dyspnea is concerning for possible ischemic etiology. It would appear to this the first time that symptoms been associated with exertion as well as somewhat had just been at rest.      Relevant Medications   nitroGLYCERIN (NITROSTAT) 0.4 MG SL tablet   Other Relevant Orders   EKG 12-Lead (Completed)   MYOCARDIAL PERFUSION IMAGING   CHF (congestive heart failure) ([place])    Seems relatively euvolemic.  I suspect that some diastolic component to it.  Not currently on diuretic.  But on relatively stable dose of ACE inhibitor.  I suspect that he he may very well need a full echocardiogram to get a better sense of his heart and lung function, for now we are proceeding with a stress test to exclude ischemic etiology.      Relevant Medications   nitroGLYCERIN (NITROSTAT) 0.4 MG SL tablet  amLODipine (NORVASC) 2.5 MG tablet   Coronary artery disease involving coronary bypass graft of native heart with angina pectoris (HCC) - Primary (Chronic)    Extensive native CAD with also occluded vein graft to the RCA.  Now having some symptoms that are somewhat concerning for angina.  It is hard to tell if this is because of his recent COPD exacerbation or it is coronary disease. Plan: Add amlodipine for little red blood pressure control and antianginal effect.  Would not add beta-blocker at this point because of dyspnea and wheezing.  Plan: Myoview Stress Test      Relevant Medications   nitroGLYCERIN (NITROSTAT) 0.4 MG SL tablet   amLODipine (NORVASC) 2.5 MG tablet   Essential hypertension (Chronic)    Blood pressure is quite high.  Should easily be able to  tolerate amlodipine.  Using amlodipine for antianginal benefit is I am reluctant to start him on a beta-blocker because of his significant wheezing.      Relevant Medications   nitroGLYCERIN (NITROSTAT) 0.4 MG SL tablet   amLODipine (NORVASC) 2.5 MG tablet   Hyperlipidemia with target LDL less than 70 (Chronic)    He is no longer on Zetia.  Is statin intolerant.  His LDL is 132.  He definitely needs this treated, possibly PCSK9 inhibitor.  We will proceed with his cardiac and noncardiac vascular disease.  Once complete, we can then consider PCSK9 inhibitor.      Relevant Medications   nitroGLYCERIN (NITROSTAT) 0.4 MG SL tablet   amLODipine (NORVASC) 2.5 MG tablet   Left main coronary artery disease (Chronic)    Level of symptoms that she is feeling now almost makes me feel as though it should be lethargic going through small cloud comparator her now recovering post hospitalization is being back on oxygen and being monitored. Last stress test evaluation showed no evidence of ischemia.  We will  Proceed with relook Myoview stress test to reevaluate symptoms concerning for angina and stress.  Most notably continue ramipril with amlodipine.  Not currently on statin.      Relevant Medications   nitroGLYCERIN (NITROSTAT) 0.4 MG SL tablet   amLODipine (NORVASC) 2.5 MG tablet   Other Relevant Orders   MYOCARDIAL PERFUSION IMAGING   Radial artery occlusion, left (HCC)    Significantly reduced pulse in the left arm versus right.  I am concerned because he has a LIMA graft and he is now having symptoms concerning for angina.  We will check upper extremity arterial Dopplers.      Relevant Medications   nitroGLYCERIN (NITROSTAT) 0.4 MG SL tablet   amLODipine (NORVASC) 2.5 MG tablet   S/P CABG x 4 (Chronic)   Relevant Orders   EKG 12-Lead (Completed)   MYOCARDIAL PERFUSION IMAGING   Smoker unmotivated to quit (Chronic)    No plans to quit even though we are now going back into look for  ischemia.       Other Visit Diagnoses    PAD (peripheral artery disease) (HCC)       Relevant Medications   nitroGLYCERIN (NITROSTAT) 0.4 MG SL tablet   amLODipine (NORVASC) 2.5 MG tablet      Current medicines are reviewed at length with the patient today. (+/- concerns) none The following changes have been made:See below  Patient Instructions  Medication INSTRUCTION  NITROGLYCERIN 0.4 MG USE SUBLINGUAL ( UNDER THE TONGUE)AS NEEDED FOR CHEST DISCOMFORT     Amlodipine 2.5 mg one tablet in the morning.    TEST SCHEDULE  AT [place] Your physician has requested that you have a lexiscan myoview. For further information please visit [email or web address]. Please follow instruction sheet, as given.  And Your physician has requested that you have a carotid duplex. This test is an ultrasound of the carotid arteries in your neck. It looks at blood flow through these arteries that supply the brain with blood. Allow one hour for this exam. There are no restrictions or special instructions.  And Your physician has requested that you have  upper extremity arterial duplex. This test is an ultrasound of the arteries in the arms. It looks at arterial blood flow in the  arms. Allow one hour for Upper Arterial scans. There are no restrictions or special instructions   Your physician recommends that you schedule a follow-up appointment in 1 month with DR Meriel Kelliher.   If you need a refill on your cardiac medications before your next appointment, please call your pharmacy.    Studies Ordered:   Orders Placed This Encounter  Procedures  . MYOCARDIAL PERFUSION IMAGING  . EKG 12-Lead      Glenetta Hew, M.D., M.S. Interventional Cardiologist   Pager # [phone] Phone # [phone] [address] [place], [place] [address]   Thank you for choosing [facility] at [facility]!!

## 2017-08-28 NOTE — Patient Instructions (Addendum)
Medication INSTRUCTION  NITROGLYCERIN 0.4 MG USE SUBLINGUAL ( UNDER THE TONGUE)AS NEEDED FOR CHEST DISCOMFORT     Amlodipine 2.5 mg one tablet in the morning.    TEST SCHEDULE AT [place] Your physician has requested that you have a lexiscan myoview. For further information please visit [email or web address]. Please follow instruction sheet, as given.  And Your physician has requested that you have a carotid duplex. This test is an ultrasound of the carotid arteries in your neck. It looks at blood flow through these arteries that supply the brain with blood. Allow one hour for this exam. There are no restrictions or special instructions.  And Your physician has requested that you have  upper extremity arterial duplex. This test is an ultrasound of the arteries in the arms. It looks at arterial blood flow in the  arms. Allow one hour for Upper Arterial scans. There are no restrictions or special instructions   Your physician recommends that you schedule a follow-up appointment in 1 month with DR HARDING.   If you need a refill on your cardiac medications before your next appointment, please call your pharmacy.

## 2017-08-29 ENCOUNTER — Other Ambulatory Visit: Payer: Self-pay | Admitting: Cardiology

## 2017-08-29 DIAGNOSIS — I70208 Unspecified atherosclerosis of native arteries of extremities, other extremity: Secondary | ICD-10-CM

## 2017-08-29 DIAGNOSIS — I739 Peripheral vascular disease, unspecified: Secondary | ICD-10-CM

## 2017-08-30 ENCOUNTER — Encounter: Payer: Self-pay | Admitting: Cardiology

## 2017-08-30 NOTE — Assessment & Plan Note (Signed)
He is no longer on Zetia.  Is statin intolerant.  His LDL is 132.  He definitely needs this treated, possibly PCSK9 inhibitor.  We will proceed with his cardiac and noncardiac vascular disease.  Once complete, we can then consider PCSK9 inhibitor.

## 2017-08-30 NOTE — Assessment & Plan Note (Signed)
No plans to quit even though we are now going back into look for ischemia.

## 2017-08-30 NOTE — Assessment & Plan Note (Signed)
Blood pressure is quite high.  Should easily be able to tolerate amlodipine.  Using amlodipine for antianginal benefit is I am reluctant to start him on a beta-blocker because of his significant wheezing.

## 2017-08-30 NOTE — Assessment & Plan Note (Signed)
He is having symptoms of chest discomfort that are not completely consistent with angina, but there are some similarities.  It is similar to back when he had issues with his coronaries before, but last evaluation of his cath did not show any new findings besides occluded native RCA.  Plan: Recommend that he uses nitroglycerin in addition to asked her chest pain will add amlodipine for antianginal effect.  If necessary we could have him stop his ACE inhibitor. No beta-blocker because of his COPD.

## 2017-08-30 NOTE — Assessment & Plan Note (Signed)
Significantly reduced pulse in the left arm versus right.  I am concerned because he has a LIMA graft and he is now having symptoms concerning for angina.  We will check upper extremity arterial Dopplers.

## 2017-08-30 NOTE — Assessment & Plan Note (Signed)
I do think that some of his chest pain is musculoskeletal in nature cough, but the part that is associate with exertional dyspnea is concerning for possible ischemic etiology. It would appear to this the first time that symptoms been associated with exertion as well as somewhat had just been at rest.

## 2017-08-30 NOTE — Assessment & Plan Note (Signed)
Seems relatively euvolemic.  I suspect that some diastolic component to it.  Not currently on diuretic.  But on relatively stable dose of ACE inhibitor.  I suspect that he he may very well need a full echocardiogram to get a better sense of his heart and lung function, for now we are proceeding with a stress test to exclude ischemic etiology.

## 2017-08-30 NOTE — Assessment & Plan Note (Signed)
Extensive native CAD with also occluded vein graft to the RCA.  Now having some symptoms that are somewhat concerning for angina.  It is hard to tell if this is because of his recent COPD exacerbation or it is coronary disease. Plan: Add amlodipine for little red blood pressure control and antianginal effect.  Would not add beta-blocker at this point because of dyspnea and wheezing.  Plan: Myoview Stress Test

## 2017-08-30 NOTE — Assessment & Plan Note (Addendum)
Level of symptoms that she is feeling now almost makes me feel as though it should be lethargic going through small cloud comparator her now recovering post hospitalization is being back on oxygen and being monitored. Last stress test evaluation showed no evidence of ischemia.  We will  Proceed with relook Myoview stress test to reevaluate symptoms concerning for angina and stress.  Most notably continue ramipril with amlodipine.  Not currently on statin.

## 2017-09-05 HISTORY — PX: TRANSTHORACIC ECHOCARDIOGRAM: SHX275

## 2017-09-10 ENCOUNTER — Telehealth (HOSPITAL_COMMUNITY): Payer: Self-pay

## 2017-09-10 ENCOUNTER — Encounter: Payer: Self-pay | Admitting: Family Medicine

## 2017-09-10 DIAGNOSIS — K000 Anodontia: Secondary | ICD-10-CM

## 2017-09-10 DIAGNOSIS — K08109 Complete loss of teeth, unspecified cause, unspecified class: Secondary | ICD-10-CM | POA: Insufficient documentation

## 2017-09-10 DIAGNOSIS — H9193 Unspecified hearing loss, bilateral: Secondary | ICD-10-CM | POA: Insufficient documentation

## 2017-09-10 DIAGNOSIS — F4310 Post-traumatic stress disorder, unspecified: Secondary | ICD-10-CM | POA: Insufficient documentation

## 2017-09-10 DIAGNOSIS — G4733 Obstructive sleep apnea (adult) (pediatric): Secondary | ICD-10-CM | POA: Insufficient documentation

## 2017-09-10 NOTE — Telephone Encounter (Signed)
Encounter complete. 

## 2017-09-12 ENCOUNTER — Ambulatory Visit (HOSPITAL_BASED_OUTPATIENT_CLINIC_OR_DEPARTMENT_OTHER)
Admission: RE | Admit: 2017-09-12 | Discharge: 2017-09-12 | Disposition: A | Discharge status: Home / Self Care | Payer: Medicare Other | Source: Ambulatory Visit | Attending: Cardiology | Admitting: Cardiology

## 2017-09-12 ENCOUNTER — Inpatient Hospital Stay (HOSPITAL_COMMUNITY): Payer: Medicare Other

## 2017-09-12 ENCOUNTER — Other Ambulatory Visit: Payer: Self-pay

## 2017-09-12 ENCOUNTER — Ambulatory Visit (HOSPITAL_COMMUNITY)
Admission: RE | Admit: 2017-09-12 | Payer: Medicare Other | Source: Ambulatory Visit | Attending: Cardiology | Admitting: Cardiology

## 2017-09-12 ENCOUNTER — Emergency Department (HOSPITAL_COMMUNITY): Payer: Medicare Other

## 2017-09-12 ENCOUNTER — Encounter (HOSPITAL_COMMUNITY): Payer: Self-pay

## 2017-09-12 ENCOUNTER — Inpatient Hospital Stay (HOSPITAL_COMMUNITY)
Admission: EM | Admit: 2017-09-12 | Discharge: 2017-09-13 | DRG: 190 | Disposition: A | Discharge status: Home / Self Care | Payer: Medicare Other | Attending: Internal Medicine | Admitting: Internal Medicine

## 2017-09-12 ENCOUNTER — Ambulatory Visit (HOSPITAL_COMMUNITY): Payer: Medicare Other

## 2017-09-12 DIAGNOSIS — J440 Chronic obstructive pulmonary disease with acute lower respiratory infection: Secondary | ICD-10-CM | POA: Diagnosis present

## 2017-09-12 DIAGNOSIS — I2510 Atherosclerotic heart disease of native coronary artery without angina pectoris: Secondary | ICD-10-CM | POA: Diagnosis not present

## 2017-09-12 DIAGNOSIS — H9193 Unspecified hearing loss, bilateral: Secondary | ICD-10-CM | POA: Diagnosis present

## 2017-09-12 DIAGNOSIS — J441 Chronic obstructive pulmonary disease with (acute) exacerbation: Principal | ICD-10-CM

## 2017-09-12 DIAGNOSIS — I70208 Unspecified atherosclerosis of native arteries of extremities, other extremity: Secondary | ICD-10-CM

## 2017-09-12 DIAGNOSIS — Z86718 Personal history of other venous thrombosis and embolism: Secondary | ICD-10-CM

## 2017-09-12 DIAGNOSIS — Z888 Allergy status to other drugs, medicaments and biological substances status: Secondary | ICD-10-CM

## 2017-09-12 DIAGNOSIS — Z885 Allergy status to narcotic agent status: Secondary | ICD-10-CM | POA: Diagnosis not present

## 2017-09-12 DIAGNOSIS — I739 Peripheral vascular disease, unspecified: Secondary | ICD-10-CM

## 2017-09-12 DIAGNOSIS — R0602 Shortness of breath: Secondary | ICD-10-CM | POA: Diagnosis present

## 2017-09-12 DIAGNOSIS — Z886 Allergy status to analgesic agent status: Secondary | ICD-10-CM | POA: Diagnosis not present

## 2017-09-12 DIAGNOSIS — J181 Lobar pneumonia, unspecified organism: Secondary | ICD-10-CM | POA: Diagnosis not present

## 2017-09-12 DIAGNOSIS — F17210 Nicotine dependence, cigarettes, uncomplicated: Secondary | ICD-10-CM | POA: Diagnosis present

## 2017-09-12 DIAGNOSIS — G4733 Obstructive sleep apnea (adult) (pediatric): Secondary | ICD-10-CM | POA: Diagnosis present

## 2017-09-12 DIAGNOSIS — E860 Dehydration: Secondary | ICD-10-CM | POA: Diagnosis present

## 2017-09-12 DIAGNOSIS — I509 Heart failure, unspecified: Secondary | ICD-10-CM | POA: Diagnosis present

## 2017-09-12 DIAGNOSIS — I110 Hypertensive heart disease with heart failure: Secondary | ICD-10-CM | POA: Diagnosis present

## 2017-09-12 DIAGNOSIS — Z974 Presence of external hearing-aid: Secondary | ICD-10-CM

## 2017-09-12 DIAGNOSIS — J189 Pneumonia, unspecified organism: Secondary | ICD-10-CM | POA: Diagnosis not present

## 2017-09-12 DIAGNOSIS — R05 Cough: Secondary | ICD-10-CM | POA: Diagnosis not present

## 2017-09-12 DIAGNOSIS — I493 Ventricular premature depolarization: Secondary | ICD-10-CM | POA: Diagnosis present

## 2017-09-12 DIAGNOSIS — R079 Chest pain, unspecified: Secondary | ICD-10-CM

## 2017-09-12 DIAGNOSIS — R0600 Dyspnea, unspecified: Secondary | ICD-10-CM | POA: Diagnosis present

## 2017-09-12 DIAGNOSIS — Z9981 Dependence on supplemental oxygen: Secondary | ICD-10-CM | POA: Diagnosis not present

## 2017-09-12 DIAGNOSIS — Z951 Presence of aortocoronary bypass graft: Secondary | ICD-10-CM

## 2017-09-12 DIAGNOSIS — J9611 Chronic respiratory failure with hypoxia: Secondary | ICD-10-CM | POA: Diagnosis not present

## 2017-09-12 DIAGNOSIS — I208 Other forms of angina pectoris: Secondary | ICD-10-CM

## 2017-09-12 DIAGNOSIS — Z85118 Personal history of other malignant neoplasm of bronchus and lung: Secondary | ICD-10-CM | POA: Diagnosis not present

## 2017-09-12 DIAGNOSIS — Z902 Acquired absence of lung [part of]: Secondary | ICD-10-CM

## 2017-09-12 DIAGNOSIS — F4310 Post-traumatic stress disorder, unspecified: Secondary | ICD-10-CM | POA: Diagnosis present

## 2017-09-12 DIAGNOSIS — F17200 Nicotine dependence, unspecified, uncomplicated: Secondary | ICD-10-CM | POA: Diagnosis not present

## 2017-09-12 LAB — COMPREHENSIVE METABOLIC PANEL
ALBUMIN: 3.9 g/dL (ref 3.5–5.0)
ALT: 22 U/L (ref 17–63)
ANION GAP: 11 (ref 5–15)
AST: 18 U/L (ref 15–41)
Alkaline Phosphatase: 66 U/L (ref 38–126)
BUN: 25 mg/dL — ABNORMAL HIGH (ref 6–20)
CO2: 27 mmol/L (ref 22–32)
CREATININE: 1.09 mg/dL (ref 0.61–1.24)
Calcium: 9 mg/dL (ref 8.9–10.3)
Chloride: 103 mmol/L (ref 101–111)
GFR calc Af Amer: >60 mL/min (ref >60)
GFR calc non Af Amer: >60 mL/min (ref >60)
GLUCOSE: 119 mg/dL — AB (ref 65–99)
Potassium: 3.7 mmol/L (ref 3.5–5.1)
SODIUM: 141 mmol/L (ref 135–145)
Total Bilirubin: 1.1 mg/dL (ref 0.3–1.2)
Total Protein: 7.4 g/dL (ref 6.5–8.1)

## 2017-09-12 LAB — EXPECTORATED SPUTUM ASSESSMENT W REFEX TO RESP CULTURE

## 2017-09-12 LAB — CBC WITH DIFFERENTIAL/PLATELET
Basophils Absolute: 0 10*3/uL (ref 0.0–0.1)
Basophils Relative: 0 %
Eosinophils Absolute: 0.1 10*3/uL (ref 0.0–0.7)
Eosinophils Relative: 1 %
HEMATOCRIT: 40.4 % (ref 39.0–52.0)
Hemoglobin: 13.4 g/dL (ref 13.0–17.0)
Lymphocytes Relative: 9 %
Lymphs Abs: 1 10*3/uL (ref 0.7–4.0)
MCH: 34 pg (ref 26.0–34.0)
MCHC: 33.2 g/dL (ref 30.0–36.0)
MCV: 102.5 fL — ABNORMAL HIGH (ref 78.0–100.0)
MONO ABS: 0.8 10*3/uL (ref 0.1–1.0)
MONOS PCT: 8 %
NEUTROS ABS: 8.4 10*3/uL — AB (ref 1.7–7.7)
Neutrophils Relative %: 82 %
Platelets: 148 10*3/uL — ABNORMAL LOW (ref 150–400)
RBC: 3.94 MIL/uL — ABNORMAL LOW (ref 4.22–5.81)
RDW: 14 % (ref 11.5–15.5)
WBC: 10.2 10*3/uL (ref 4.0–10.5)

## 2017-09-12 LAB — BRAIN NATRIURETIC PEPTIDE: B Natriuretic Peptide: 73.2 pg/mL (ref 0.0–100.0)

## 2017-09-12 LAB — MYOCARDIAL PERFUSION IMAGING
CHL CUP NUCLEAR SDS: 5
CHL CUP NUCLEAR SRS: 6
LV dias vol: 146 mL (ref 62–150)
LVSYSVOL: 84 mL
NUC STRESS TID: 1.22
Peak HR: 96 {beats}/min
Rest HR: 86 {beats}/min
SSS: 11

## 2017-09-12 LAB — URINALYSIS, ROUTINE W REFLEX MICROSCOPIC
BILIRUBIN URINE: NEGATIVE
GLUCOSE, UA: NEGATIVE mg/dL
HGB URINE DIPSTICK: NEGATIVE
KETONES UR: NEGATIVE mg/dL
Leukocytes, UA: NEGATIVE
NITRITE: NEGATIVE
PH: 5 (ref 5.0–8.0)
Protein, ur: NEGATIVE mg/dL
Specific Gravity, Urine: 1.021 (ref 1.005–1.030)

## 2017-09-12 LAB — MRSA PCR SCREENING: MRSA BY PCR: NEGATIVE

## 2017-09-12 LAB — INFLUENZA PANEL BY PCR (TYPE A & B)
Influenza A By PCR: NEGATIVE
Influenza B By PCR: NEGATIVE

## 2017-09-12 LAB — EXPECTORATED SPUTUM ASSESSMENT W GRAM STAIN, RFLX TO RESP C

## 2017-09-12 LAB — I-STAT TROPONIN, ED: TROPONIN I, POC: 0.05 ng/mL (ref 0.00–0.08)

## 2017-09-12 LAB — I-STAT CG4 LACTIC ACID, ED: Lactic Acid, Venous: 0.95 mmol/L (ref 0.5–1.9)

## 2017-09-12 MED ORDER — DULOXETINE HCL 30 MG PO CPEP: 60.0000 mg | ORAL_CAPSULE | Freq: Every day | ORAL | Status: DC

## 2017-09-12 MED ORDER — IPRATROPIUM-ALBUTEROL 0.5-2.5 (3) MG/3ML IN SOLN
3.0000 mL | Freq: Once | RESPIRATORY_TRACT | Status: AC
  Administered 2017-09-12: 3 mL via RESPIRATORY_TRACT
  Filled 2017-09-12: qty 3

## 2017-09-12 MED ORDER — CLONAZEPAM 1 MG PO TABS
1.0000 mg | ORAL_TABLET | Freq: Two times a day (BID) | ORAL | Status: DC | PRN
  Administered 2017-09-13: 1 mg via ORAL
  Filled 2017-09-12: qty 1

## 2017-09-12 MED ORDER — REGADENOSON 0.4 MG/5ML IV SOLN
0.4000 mg | Freq: Once | INTRAVENOUS | Status: AC
  Administered 2017-09-12: 0.4 mg via INTRAVENOUS

## 2017-09-12 MED ORDER — NITROGLYCERIN 0.4 MG SL SUBL: 0.4000 mg | SUBLINGUAL_TABLET | SUBLINGUAL | Status: DC | PRN

## 2017-09-12 MED ORDER — IPRATROPIUM-ALBUTEROL 0.5-2.5 (3) MG/3ML IN SOLN
3.0000 mL | Freq: Four times a day (QID) | RESPIRATORY_TRACT | Status: DC
  Administered 2017-09-12 – 2017-09-13 (×4): 3 mL via RESPIRATORY_TRACT
  Filled 2017-09-12 (×5): qty 3

## 2017-09-12 MED ORDER — TECHNETIUM TC 99M TETROFOSMIN IV KIT
10.8000 | PACK | Freq: Once | INTRAVENOUS | Status: AC | PRN
  Administered 2017-09-12: 10.8 via INTRAVENOUS
  Filled 2017-09-12: qty 11

## 2017-09-12 MED ORDER — TECHNETIUM TC 99M TETROFOSMIN IV KIT
32.1000 | PACK | Freq: Once | INTRAVENOUS | Status: AC | PRN
  Administered 2017-09-12: 32.1 via INTRAVENOUS
  Filled 2017-09-12: qty 33

## 2017-09-12 MED ORDER — SODIUM CHLORIDE 0.9 % IV SOLN
500.0000 mg | Freq: Once | INTRAVENOUS | Status: AC
  Administered 2017-09-12: 500 mg via INTRAVENOUS
  Filled 2017-09-12: qty 500

## 2017-09-12 MED ORDER — ALBUTEROL SULFATE (2.5 MG/3ML) 0.083% IN NEBU
5.0000 mg | INHALATION_SOLUTION | Freq: Once | RESPIRATORY_TRACT | Status: AC
  Administered 2017-09-12: 5 mg via RESPIRATORY_TRACT
  Filled 2017-09-12: qty 6

## 2017-09-12 MED ORDER — GABAPENTIN 100 MG PO CAPS
100.0000 mg | ORAL_CAPSULE | Freq: Every day | ORAL | Status: DC
  Administered 2017-09-12: 100 mg via ORAL
  Filled 2017-09-12: qty 1

## 2017-09-12 MED ORDER — GUAIFENESIN ER 600 MG PO TB12
600.0000 mg | ORAL_TABLET | Freq: Two times a day (BID) | ORAL | Status: DC
  Administered 2017-09-12 – 2017-09-13 (×2): 600 mg via ORAL
  Filled 2017-09-12 (×2): qty 1

## 2017-09-12 MED ORDER — ACETAMINOPHEN 325 MG PO TABS
650.0000 mg | ORAL_TABLET | Freq: Four times a day (QID) | ORAL | Status: DC | PRN
  Administered 2017-09-12: 650 mg via ORAL
  Filled 2017-09-12: qty 2

## 2017-09-12 MED ORDER — IOPAMIDOL (ISOVUE-370) INJECTION 76%
INTRAVENOUS | Status: AC
  Administered 2017-09-12: 100 mL via INTRAVENOUS
  Filled 2017-09-12: qty 100

## 2017-09-12 MED ORDER — MOMETASONE FURO-FORMOTEROL FUM 200-5 MCG/ACT IN AERO
2.0000 | INHALATION_SPRAY | Freq: Two times a day (BID) | RESPIRATORY_TRACT | Status: DC
  Administered 2017-09-13: 2 via RESPIRATORY_TRACT
  Filled 2017-09-12: qty 8.8

## 2017-09-12 MED ORDER — TIOTROPIUM BROMIDE MONOHYDRATE 18 MCG IN CAPS
18.0000 ug | ORAL_CAPSULE | Freq: Every day | RESPIRATORY_TRACT | Status: DC
  Administered 2017-09-13: 18 ug via RESPIRATORY_TRACT
  Filled 2017-09-12: qty 5

## 2017-09-12 MED ORDER — METHYLPREDNISOLONE SODIUM SUCC 125 MG IJ SOLR
60.0000 mg | Freq: Three times a day (TID) | INTRAMUSCULAR | Status: DC
Start: 2017-09-12 — End: 2017-09-13
  Administered 2017-09-12 – 2017-09-13 (×3): 60 mg via INTRAVENOUS
  Filled 2017-09-12 (×3): qty 2

## 2017-09-12 MED ORDER — CEFTRIAXONE SODIUM 1 G IJ SOLR
1.0000 g | INTRAMUSCULAR | Status: DC
  Administered 2017-09-13: 1 g via INTRAVENOUS
  Filled 2017-09-12: qty 1

## 2017-09-12 MED ORDER — ENOXAPARIN SODIUM 40 MG/0.4ML ~~LOC~~ SOLN
40.0000 mg | SUBCUTANEOUS | Status: DC
  Administered 2017-09-12: 40 mg via SUBCUTANEOUS
  Filled 2017-09-12: qty 0.4

## 2017-09-12 MED ORDER — METHYLPREDNISOLONE SODIUM SUCC 125 MG IJ SOLR
125.0000 mg | Freq: Once | INTRAMUSCULAR | Status: AC
  Administered 2017-09-12: 125 mg via INTRAVENOUS
  Filled 2017-09-12: qty 2

## 2017-09-12 MED ORDER — DULOXETINE HCL 60 MG PO CPEP: 60.0000 mg | ORAL_CAPSULE | Freq: Every day | ORAL | Status: DC

## 2017-09-12 MED ORDER — SODIUM CHLORIDE 0.9 % IV SOLN
1.0000 g | Freq: Once | INTRAVENOUS | Status: AC
  Administered 2017-09-12: 1 g via INTRAVENOUS
  Filled 2017-09-12: qty 10

## 2017-09-12 MED ORDER — SODIUM CHLORIDE 0.9 % IJ SOLN
INTRAMUSCULAR | Status: AC
  Filled 2017-09-12: qty 50

## 2017-09-12 MED ORDER — FUROSEMIDE 10 MG/ML IJ SOLN: 40.0000 mg | Freq: Once | INTRAMUSCULAR | Status: DC

## 2017-09-12 MED ORDER — AMLODIPINE BESYLATE 5 MG PO TABS
2.5000 mg | ORAL_TABLET | Freq: Every day | ORAL | Status: DC
  Administered 2017-09-13: 2.5 mg via ORAL
  Filled 2017-09-12: qty 1

## 2017-09-12 MED ORDER — AZITHROMYCIN 250 MG PO TABS
500.0000 mg | ORAL_TABLET | Freq: Every day | ORAL | Status: DC
  Administered 2017-09-13: 500 mg via ORAL
  Filled 2017-09-12: qty 2

## 2017-09-12 MED ORDER — RAMIPRIL 5 MG PO CAPS
5.0000 mg | ORAL_CAPSULE | Freq: Every morning | ORAL | Status: DC
  Administered 2017-09-13: 5 mg via ORAL
  Filled 2017-09-12: qty 1

## 2017-09-12 MED ORDER — PRAZOSIN HCL 1 MG PO CAPS
1.0000 mg | ORAL_CAPSULE | Freq: Every day | ORAL | Status: DC
  Administered 2017-09-12: 1 mg via ORAL
  Filled 2017-09-12 (×2): qty 1

## 2017-09-12 MED ORDER — DULOXETINE HCL 30 MG PO CPEP: 30.0000 mg | ORAL_CAPSULE | Freq: Every day | ORAL | Status: DC

## 2017-09-12 MED ORDER — ALBUTEROL SULFATE (2.5 MG/3ML) 0.083% IN NEBU: 3.0000 mL | INHALATION_SOLUTION | Freq: Four times a day (QID) | RESPIRATORY_TRACT | Status: DC | PRN

## 2017-09-12 MED ORDER — HYDRALAZINE HCL 20 MG/ML IJ SOLN
10.0000 mg | Freq: Four times a day (QID) | INTRAMUSCULAR | Status: DC | PRN
Start: 2017-09-12 — End: 2017-09-13

## 2017-09-12 MED ORDER — DULOXETINE HCL 30 MG PO CPEP
60.0000 mg | ORAL_CAPSULE | Freq: Every day | ORAL | Status: DC
  Administered 2017-09-13: 60 mg via ORAL
  Filled 2017-09-12: qty 2

## 2017-09-12 MED ORDER — DULOXETINE HCL 30 MG PO CPEP
30.0000 mg | ORAL_CAPSULE | Freq: Every day | ORAL | Status: DC
  Administered 2017-09-13: 30 mg via ORAL

## 2017-09-12 MED ORDER — QUETIAPINE FUMARATE ER 300 MG PO TB24
300.0000 mg | ORAL_TABLET | Freq: Every day | ORAL | Status: DC
  Administered 2017-09-12: 300 mg via ORAL

## 2017-09-12 MED ORDER — QUETIAPINE FUMARATE ER 300 MG PO TB24
300.0000 mg | ORAL_TABLET | Freq: Every day | ORAL | Status: DC
  Filled 2017-09-12: qty 1

## 2017-09-12 NOTE — ED Triage Notes (Signed)
Patient came In from cardiology office with wife. C/o of SHOB, worsening over the past 2 days. Pt sent for chest x-ray to rule out pneumonia. Pt on 2 L of O2 with exertion and on concentration oxygen at night. Pt appears labored.

## 2017-09-12 NOTE — ED Provider Notes (Signed)
[facility] DEPT Provider Note   CSN: [id number] Arrival date & time: 09/12/17  1307     History   Chief Complaint Chief Complaint  Patient presents with  . Shortness of Breath    HPI John Ball is a 77 y.o. male.  HPI Patient reports his been short of breath for almost 4-5 days.  Patient's wife reports he said he thought he had pneumonia.  Patient states he has been coughing.  It is not been productive.  Reports he has been aching slightly at the bases of both ribs on his back from coughing.  No leg swelling or calf pain.  Patient has been using his nebulizer treatment 3 times a day.  Patient reports he has history of COPD from agent orange exposure. Past Medical History:  Diagnosis Date  . Anxiety   . Arthritis   . Complication of anesthesia    hx prolonged post op oxygen dependent/  hx hallucinations for 2 day post op lobectomy 2012  . COPD, severe ([place])   . Depression   . Essential hypertension   . Hearing loss of both ears    does wear his hearing aids  . History of arterial bypass of lower extremity    RIGHT FEM-POP  . History of CHF (congestive heart failure)   . History of DVT of lower extremity   . History of panic attacks   . Hyperlipidemia with target LDL less than 70   . Left main coronary artery disease 2004   a) Severe LM CAD 2004 -->s/p  cabg x4 (LIMA-LAD, free RIMA-RI, SVG-OM 2, SVG-RPDA); b) 06/2014: Abnormal Myoview --> c)cardiac Cath Jan 2016: pLM 70%, pLAD 40-50%- patent LIMA-LAD.  RI -competitive flow noted from RIMA-RI graft, circumflex, normal caliber with moderate OM1.  OM 2 occluded.  SVG-OM 2 patent. RCA CTO after large RV M, faint R-R and LAD septal-PDA collaterals =--> new  . Neuropathy, peripheral   . 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 Nocturia   . OSA (obstructive sleep apnea)    uses O2  via [place]  --  intolerate to CPAP  . Peripheral arterial occlusive disease ([place]) followed by dr Early--  per  last dopplers 06-28-2014 no change right graft,  50-74% stenosis common and left mid superficial femoral artery   2011   a) Gore-Tex graft right AK popA-BK popA --> b) 2012: thrombosed graft --> thrombectomy with dacron patch = 1 V runnof via Peroneal; c) 01/2014: R femoropopliteal bypass with left femoral vein  . PTSD (post-traumatic stress disorder)    anxiety attack's---  [place] [employer]  . Recurrent productive cough    SMOKER'S COUGH  . 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  Medical management  . Smoker unmotivated to quit    Reportedly he came close to having a nervous breakdown when he last tried to quit  . 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 Wears glasses     Patient Active Problem List   Diagnosis Date Noted  . PTSD (post-traumatic stress disorder) 09/10/2017  . OSA (obstructive sleep apnea) 09/10/2017  . No natural teeth 09/10/2017  . Hearing loss of both ears 09/10/2017  . Atypical angina ([place]) 08/28/2017  . Radial artery occlusion, left ([place]) 08/28/2017  . 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 COPD exacerbation ([place]) 07/21/2016  . Acute on chronic respiratory failure with hypoxia ([place]) 07/21/2016  . CHF (congestive heart failure) ([place]) 07/21/2016  . Major depression, recurrent ([place]) 07/21/2016  . Abnormal stress test   . Coronary artery disease  involving coronary bypass graft of native heart with angina pectoris ([place])   . Chest pain at rest 06/06/2014  . Peripheral arterial occlusive disease ([place])   . Hyperlipidemia with target LDL less than 70   . Smoker unmotivated to quit   . Essential hypertension   . COPD (chronic obstructive pulmonary disease) ([place])   . Pain of left lower extremity 02/01/2014  . Lung cancer ([place]) 05/15/2011  . Squamous cell lung cancer ([place]) 05/05/2011  . Left main coronary artery disease 07/08/2002  . 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/2002    Past Surgical History:  Procedure Laterality Date  . CARDIAC CATHETERIZATION  07-13-2002   Ischemia RCA region on Cardiolite// 60-70% ostial left main, 50% midCx, 60-70% mid RI,  70% mRCA, 90% JeNu and crux 75% RCA ,  95% PLA//  severe LM and 3Vessel CAD, perserved LV, ef 60%  . CARDIOVASCULAR STRESS TEST  last one  06-17-2014   dr  Ellyn Hack   Carlton Adam study with no exercise; Intermediate Risk Study;   moderate size and intensity, partially reversible inferior septal defect consistent with prior infarct and mild to moderate peri-infarct ischemia;  mild hypokinesis of the inferior septal wall,  normal LV function, ef 56%  . COLONOSCOPY  2008    WNL  . CORONARY ARTERY BYPASS GRAFT  07/27/2002   LIMA-LAD, freeRIMA-RI,SVG-OM2, SVG-rPDA; Dr. Roxan Hockey  . CYSTOSCOPY W/ URETERAL STENT PLACEMENT N/A 02/21/2015   Procedure: BILATERAL RETROGRADE PYELOGRAM;  Surgeon: Festus Aloe, MD;  Location: [facility];  Service: Urology;  Laterality: N/A;  . EYE SURGERY Left Nov 2013  . FEMORAL ARTERY - POPLITEAL ARTERY BYPASS GRAFT Right 10-24-2008   dr early   w/ GoreTex graft  . FEMORAL-TIBIAL BYPASS GRAFT Right 01/10/2014   Procedure: Right Femoral to Posterior Tibial Bypass Graft using Left Leg Vein, Thrombectomy Right Common Femoral and Profunda of Leg . ;  Surgeon: Rosetta Posner, MD;  Location: [facility];  Service: Vascular;  Laterality: Right;  . HYDROCELE EXCISION Left   . LEFT  ANKLE SURGERY  1990's  . LEFT HEART CATHETERIZATION WITH CORONARY/GRAFT ANGIOGRAM N/A 07/12/2014   Procedure: LEFT HEART CATHETERIZATION WITH Beatrix Fetters;  Surgeon: Troy Sine, MD;  Location: [facility] CATH LAB;  Service: Cardiovascular;  Laterality: N/A;  pLM 70%, pLAD 40-50%- patent LIMA-LAD.  RI -competitive flow noted from fRIMA-RI graft, circumflex, normal caliber with moderate OM1.  OM 2 occluded.  SVG-OM 2 patent. RCA CTO after large RVM, faint R-R and LAD septal-PDA collateral  . NM MYOVIEW LTD  05/2014   Moderate sized, moderate intensity/partially reversible inferior defect consistent with prior infarct mild/moderate peri-infarct ischemia.  Inferoseptal HK.  INTERMEDIATE RISK. --> Cath showed  CTO of Native RCA & SVG-rPDA.  Marland Kitchen RIGHT ABOVE KNEE POPLITEAL GRAFT TO BELOW KNEE POPLITEAL ARTERY BYPASS WITH REVERSE SAPHENOUS VEIN  03-02-2010  . THROMBECTOMY OF RIGHT FEMORAL TO ABOVE KNEE POPLITEAL GORETEX GRAFT ANGIOPLASTY OF GORETEX AND SAPHENOUS VEIN JUNCTION AND ABOVE KNEE POPLITEAL ARTERY   10-06-2010   DR EARLY  . TONSILLECTOMY    . TRANSURETHRAL RESECTION OF BLADDER TUMOR N/A 02/21/2015   Procedure: TRANSURETHRAL RESECTION OF BLADDER TUMOR (TURBT);  Surgeon: Festus Aloe, MD;  Location: [facility];  Service: Urology;  Laterality: N/A;  . VIDEO ASSISTED THORACOSCOPY (VATS)/WEDGE RESECTION Right 05-01-2011    FORSYTH   LOWER LOBECTOMY W/  NODE DISSECTION       Home Medications    Prior to Admission medications   Medication Sig Start Date End Date Taking? Authorizing Provider  ADVAIR DISKUS 500-50 MCG/DOSE AEPB  03/03/17  Yes [provider]  albuterol (PROVENTIL HFA;VENTOLIN HFA) 108 (90 BASE) MCG/ACT inhaler Inhale 1 puff into the lungs every 6 (six) hours as needed for wheezing or shortness of breath.   Yes [provider]  albuterol (PROVENTIL) (5 MG/ML) 0.5% nebulizer solution Take 2.5 mg by nebulization every 6 (six) hours as needed for  wheezing or shortness of breath.   Yes [provider]  amLODipine (NORVASC) 2.5 MG tablet Take 1 tablet (2.5 mg total) by mouth daily. 08/28/17 11/26/17 Yes Leonie Man, MD  Cholecalciferol (VITAMIN D3) 1000 UNITS CAPS Take 1 capsule by mouth every morning.   Yes [provider]  clonazePAM (KLONOPIN) 1 MG tablet Take 1 mg by mouth 2 (two) times daily as needed for anxiety.   Yes [provider]  DULoxetine (CYMBALTA) 60 MG capsule Take by mouth. 60 mg tablet every morning and 30 mg tablet qhs   Yes [provider]  gabapentin (NEURONTIN) 100 MG capsule Take 100 mg by mouth at bedtime.    Yes [provider]  OXYGEN Inhale into the lungs at bedtime. Uses O2 -- intolerant to CPAP   Yes [provider]  prazosin (MINIPRESS) 1 MG capsule Take 1 mg by mouth at bedtime.   Yes [provider]  QUEtiapine (SEROQUEL XR) 300 MG 24 hr tablet Take 300 mg by mouth at bedtime.    Yes [provider]  ramipril (ALTACE) 10 MG capsule Take 5 mg by mouth every morning.    Yes [provider]  tiotropium (SPIRIVA HANDIHALER) 18 MCG inhalation capsule Place 18 mcg into inhaler and inhale daily.   Yes [provider]  traZODone (DESYREL) 150 MG tablet Take 150 mg by mouth at bedtime.   Yes [provider]  zolpidem (AMBIEN CR) 12.5 MG CR tablet Take 12.5 mg by mouth at bedtime.     Yes [provider]  nitroGLYCERIN (NITROSTAT) 0.4 MG SL tablet Place 1 tablet (0.4 mg total) under the tongue every 5 (five) minutes as needed for chest pain. 08/28/17   Leonie Man, MD    Family History Family History  Problem Relation Age of Onset  . Other Mother 31       Abdominal Aortic Aneurysm   . Stroke Mother   . Alzheimer's disease Father 26  . Stroke Father     Social History Social History   Tobacco Use  . Smoking status: Current Every Day Smoker    Packs/day: 0.50    Years: 55.00    Pack years:  27.50    Types: Cigarettes  . Smokeless  tobacco: Never Used  Substance Use Topics  . Alcohol use: No  . Drug use: No     Allergies   Aripiprazole; Aspirin; Crestor [rosuvastatin calcium]; Effexor [venlafaxine hydrochloride]; Lipitor [atorvastatin calcium]; Morphine and related; Zetia [ezetimibe]; and Zocor [simvastatin - high dose]   Review of Systems Review of Systems 10 Systems reviewed and are negative for acute change except as noted in the HPI.   Physical Exam Updated Vital Signs BP (!) 146/77 (BP Location: Right Arm)   Pulse 97   Temp 98 F (36.7 C) (Oral)   Resp 18   Ht 6' (1.829 m)   Wt 95.3 kg (210 lb)   SpO2 92%   BMI 28.48 kg/m   Physical Exam  Constitutional: He is oriented to person, place, and time.  Patient is alert with clear mental status.  Moderate increased work of breathing.  HENT:  Head: Normocephalic and atraumatic.  Mouth/Throat: Oropharynx is clear and moist.  Eyes: EOM are normal.  Neck: Neck supple.  Cardiovascular: Normal rate, regular rhythm, normal heart sounds and intact distal pulses.  Pulmonary/Chest:  Moderate increased work of breathing.  Soft breath sounds at bases.  Fine expiratory wheeze.  Abdominal: Soft. He exhibits no distension. There is no tenderness. There is no guarding.  Musculoskeletal: Normal range of motion. He exhibits no edema or tenderness.  Neurological: He is alert and oriented to person, place, and time. He exhibits normal muscle tone. Coordination normal.  Skin: Skin is warm and dry.  Psychiatric: He has a normal mood and affect.     ED Treatments / Results  Labs (all labs ordered are listed, but only abnormal results are displayed) Labs Reviewed  COMPREHENSIVE METABOLIC PANEL - Abnormal; Notable for the following components:      Result Value   Glucose, Bld 119 (*)    BUN 25 (*)    All other components within normal limits  CBC WITH DIFFERENTIAL/PLATELET - Abnormal; Notable for the following components:    RBC 3.94 (*)    MCV 102.5 (*)    Platelets 148 (*)    Neutro Abs 8.4 (*)    All other components within normal limits  CULTURE, BLOOD (ROUTINE X 2)  CULTURE, BLOOD (ROUTINE X 2)  CULTURE, EXPECTORATED SPUTUM-ASSESSMENT  MRSA PCR SCREENING  RESPIRATORY PANEL BY PCR  BRAIN NATRIURETIC PEPTIDE  URINALYSIS, ROUTINE W REFLEX MICROSCOPIC  INFLUENZA PANEL BY PCR (TYPE A & B)  STREP PNEUMONIAE URINARY ANTIGEN  I-STAT CG4 LACTIC ACID, ED  I-STAT TROPONIN, ED  I-STAT CG4 LACTIC ACID, ED    EKG  EKG Interpretation  Date/Time:  Friday September 12 2017 13:52:07 EST Ventricular Rate:  94 PR Interval:    QRS Duration: 81 QT Interval:  361 QTC Calculation: 452 R Axis:   48 Text Interpretation:  Sinus tachycardia Multiple premature complexes, vent & supraven Abnormal T, consider ischemia, lateral leads Baseline wander in lead(s) V2 agree. similar to previous except subtle lateral T wave depression . Confirmed by Charlesetta Shanks [phone]) on 09/12/2017 4:40:59 PM       Radiology Dg Chest 2 View  Result Date: 09/12/2017 CLINICAL DATA:  Patient came In from cardiology office with wife. C/o of SHOB and cough, worsening over the past 2 days - hx COPD - hx hypertension - hx CHF - CABG x 4 - right lung cancer - current everyday smoker EXAM: CHEST - 2 VIEW COMPARISON:  08/15/2016 FINDINGS: normal cardiac silhouette. Chronic elevation of RIGHT hemidiaphragm. There is airspace opacity in the  LEFT lower lobe. Patchy high diffuse pattern throughout the lungs which is slightly increased from prior. IMPRESSION: 1. Concern for LEFT lower lobe pneumonia. 2. More generalized patchy airspace disease may represent pulmonary edema. 3. Elevation of RIGHT hemidiaphragm. Electronically Signed   By: Suzy Bouchard M.D.   On: 09/12/2017 14:11    Procedures Procedures (including critical care time) CRITICAL CARE Performed by: Si Gaul   Total critical care time: 30 minutes  Critical care time was exclusive  of separately billable procedures and treating other patients.  Critical care was necessary to treat or prevent imminent or life-threatening deterioration.  Critical care was time spent personally by me on the following activities: development of treatment plan with patient and/or surrogate as well as nursing, discussions with consultants, evaluation of patient's response to treatment, examination of patient, obtaining history from patient or surrogate, ordering and performing treatments and interventions, ordering and review of laboratory studies, ordering and review of radiographic studies, pulse oximetry and re-evaluation of patient's condition.  Medications Ordered in ED Medications  azithromycin (ZITHROMAX) 500 mg in sodium chloride 0.9 % 250 mL IVPB (500 mg Intravenous New Bag/Given 09/12/17 1558)  cefTRIAXone (ROCEPHIN) 1 g in sodium chloride 0.9 % 100 mL IVPB (not administered)  azithromycin (ZITHROMAX) tablet 500 mg (not administered)  methylPREDNISolone sodium succinate (SOLU-MEDROL) 125 mg/2 mL injection 125 mg (not administered)  ipratropium-albuterol (DUONEB) 0.5-2.5 (3) MG/3ML nebulizer solution 3 mL (not administered)  guaiFENesin (MUCINEX) 12 hr tablet 600 mg (not administered)  furosemide (LASIX) injection 40 mg (not administered)  albuterol (PROVENTIL) (2.5 MG/3ML) 0.083% nebulizer solution 5 mg (5 mg Nebulization Given 09/12/17 1435)  cefTRIAXone (ROCEPHIN) 1 g in sodium chloride 0.9 % 100 mL IVPB (0 g Intravenous Stopped 09/12/17 1551)  ipratropium-albuterol (DUONEB) 0.5-2.5 (3) MG/3ML nebulizer solution 3 mL (3 mLs Nebulization Given 09/12/17 1514)     Initial Impression / Assessment and Plan / ED Course  I have reviewed the triage vital signs and the nursing notes.  Pertinent labs & imaging results that were available during my care of the patient were reviewed by me and considered in my medical decision making (see chart for details).     Consult: Dr. Erlinda Hong for  admission  Final Clinical Impressions(s) / ED Diagnoses   Final diagnoses:  Community acquired pneumonia of left lower lobe of lung ([place])  Acute congestive heart failure, unspecified heart failure type ([place])  COPD exacerbation ([place])   Patient presents with dyspnea that appears multifactorial in etiology.  Patient reports he felt like he had pneumonia.  He has had about 4 days of nonproductive cough and some body aches.  He was becoming increasingly short of breath.  Patient had nuclear stress test done today as an outpatient.  He reports he was very short of breath but completed all of his procedures.  He was then referred to the emergency department.  Test x-ray shows area suspicious for left lower lobe pneumonia.  This is treated as a community-acquired pneumonia.  Patient also has history of COPD.  He has diffuse wheeze with diminished breath sounds.  Nebulizer therapy initiated.  Patient felt improvement with nebulizer therapy.  Chest x-ray also has some patchy quality of diffuse infiltrates suggestive of edema.  She does not have peripheral edema.  Empiric Lasix administered.  At baseline patient does use 2 L nasal oxygen for activity and oxygen with a mixer for nighttime sleep.  He is now requiring oxygen at rest.  Patient does continue to be dyspneic but  feels improved after treatment.  He remains alert and appropriate.  Plan for admission. ED Discharge Orders    None       Charlesetta Shanks, MD 09/12/17 [phone]

## 2017-09-12 NOTE — H&P (Signed)
History and Physical  John Ball KWI:[id number] DOB: 04-23-1941 DOA: 09/12/2017  Referring physician: EDP PCP: Mellody Dance, DO   Chief Complaint: Productive cough, wheezing  HPI: John Ball is a 77 y.o. male   H/o COPD, on 2 L oxygen at home, squamous cell lung cancer (stage IA, s/p of right lobectomy, no further treatment needed), h/o CAD, S/P ofCABG, OSA not on CPAP, PVD, h/o DVT (currently not on anticoagulation), h/o PTSD, severe hearing loss bilaterally,  who presents with shortness breath and cough.  He was seen at cardiology clinic for stress test, he is sent to the ED due to productive cough and wheezing.  ED course: On presentation he has tachypnea, respiration rate 25  With audible wheezing ,intermittent productive cough, no fever, does have sinus tachycardia, no hypoxia on 2 L of oxygen. Blood pressure stable.  He denies chest pain, no edema.  Basic lab work unremarkable, no leukocytosis, lactic acid 0.9, BNP 73, troponin  0.05.  EKG personally reviewed, with sinus rhythm a few PVCs, T wave flattening and inversion lateral leads. Cxr: "1. Concern for LEFT lower lobe pneumonia. 2. More generalized patchy airspace disease may represent pulmonary Edema. 3. Elevation of RIGHT hemidiaphragm."  Blood culture and  respiratory viral panel obtained in the ED. He  is started on Rocephin and Zithromax, he received breathing treatment and 1 dose of steroid.  Hospitalist called to admit the patient.   Review of Systems:  Detail per HPI, Review of systems are otherwise negative  Past Medical History:  Diagnosis Date  . Anxiety   . Arthritis   . Complication of anesthesia    hx prolonged post op oxygen dependent/  hx hallucinations for 2 day post op lobectomy 2012  . COPD, severe ([place])   . Depression   . Essential hypertension   . Hearing loss of both ears    does wear his hearing aids  . History of arterial bypass of lower extremity    RIGHT FEM-POP  . History of CHF  (congestive heart failure)   . History of DVT of lower extremity   . History of panic attacks   . Hyperlipidemia with target LDL less than 70   . Left main coronary artery disease 2004   a) Severe LM CAD 2004 -->s/p  cabg x4 (LIMA-LAD, free RIMA-RI, SVG-OM 2, SVG-RPDA); b) 06/2014: Abnormal Myoview --> c)cardiac Cath Jan 2016: pLM 70%, pLAD 40-50%- patent LIMA-LAD.  RI -competitive flow noted from RIMA-RI graft, circumflex, normal caliber with moderate OM1.  OM 2 occluded.  SVG-OM 2 patent. RCA CTO after large RV M, faint R-R and LAD septal-PDA collaterals =--> new  . Neuropathy, peripheral   . 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 Nocturia   . OSA (obstructive sleep apnea)    uses O2  via [place]  --  intolerate to CPAP  . Peripheral arterial occlusive disease ([place]) followed by dr Early--  per last dopplers 06-28-2014 no change right graft,  50-74% stenosis common and left mid superficial femoral artery   2011   a) Gore-Tex graft right AK popA-BK popA --> b) 2012: thrombosed graft --> thrombectomy with dacron patch = 1 V runnof via Peroneal; c) 01/2014: R femoropopliteal bypass with left femoral vein  . PTSD (post-traumatic stress disorder)    anxiety attack's---  [place] [employer]  . Recurrent productive cough    SMOKER'S COUGH  . 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  Medical management  . Smoker unmotivated to quit    Reportedly he came close to having a nervous breakdown when he last tried to quit  . Squamous cell lung cancer ([place]) 2012-2014   (oncologist at [facility] -- dr Bretta Bang--  no recurrence , continued observation   Stage IA  Non-small cell--- a) 04/2011: R L Lobectomy & med node dissection ([id number]) - [facility] [place], [place]) w/o post-op Rx; b) CT-A Chest Jan 2013: R pl effusion, R hilar LAN (3.3 cm x 2.8 cm & 2.2 cm  x 1.3 cm); c) 2/'13 PET-CT Chest: Bilat Hilar LAN, no distant Mets  d) CT chest 9/'14: Stable shotty hilar nodes bilaterally w/ no pathologic sized LAD or suspicious Pulm nodule;   . Wears glasses    Past Surgical History:  Procedure Laterality Date  . CARDIAC CATHETERIZATION  07-13-2002   Ischemia RCA region on Cardiolite// 60-70% ostial left main, 50% midCx, 60-70% mid RI,  70% mRCA, 90% JeNu and crux 75% RCA ,  95% PLA//  severe LM and 3Vessel CAD, perserved LV, ef 60%  . CARDIOVASCULAR STRESS TEST  last one  06-17-2014   dr  Ellyn Hack   Carlton Adam study with no exercise; Intermediate Risk Study;   moderate size and intensity, partially reversible inferior septal defect consistent with prior infarct and mild to moderate peri-infarct ischemia;  mild hypokinesis of the inferior septal wall,  normal LV function, ef 56%  . COLONOSCOPY  2008    WNL  . CORONARY ARTERY BYPASS GRAFT  07/27/2002   LIMA-LAD, freeRIMA-RI,SVG-OM2, SVG-rPDA; Dr. Roxan Hockey  . CYSTOSCOPY W/ URETERAL STENT PLACEMENT N/A 02/21/2015   Procedure: BILATERAL RETROGRADE PYELOGRAM;  Surgeon: Festus Aloe, MD;  Location: [facility];  Service: Urology;  Laterality: N/A;  . EYE SURGERY Left Nov 2013  . FEMORAL ARTERY - POPLITEAL ARTERY BYPASS GRAFT Right 10-24-2008   dr early   w/ GoreTex graft  . FEMORAL-TIBIAL BYPASS GRAFT Right 01/10/2014   Procedure: Right Femoral to Posterior Tibial Bypass Graft using Left Leg Vein, Thrombectomy Right Common Femoral and Profunda of Leg . ;  Surgeon: Rosetta Posner, MD;  Location: [place];  Service: Vascular;  Laterality: Right;  . HYDROCELE EXCISION Left   . LEFT ANKLE SURGERY  1990's  . LEFT HEART CATHETERIZATION WITH CORONARY/GRAFT ANGIOGRAM N/A 07/12/2014   Procedure: LEFT HEART CATHETERIZATION WITH Beatrix Fetters;  Surgeon: Troy Sine, MD;  Location: [facility] CATH LAB;  Service: Cardiovascular;  Laterality: N/A;  pLM 70%, pLAD 40-50%- patent LIMA-LAD.  RI -competitive flow  noted from fRIMA-RI graft, circumflex, normal caliber with moderate OM1.  OM 2 occluded.  SVG-OM 2 patent. RCA CTO after large RVM, faint R-R and LAD septal-PDA collateral  . NM MYOVIEW LTD  05/2014   Moderate sized, moderate intensity/partially reversible inferior defect consistent with prior infarct mild/moderate peri-infarct ischemia.  Inferoseptal HK.  INTERMEDIATE RISK. --> Cath showed CTO of Native RCA & SVG-rPDA.  Marland Kitchen RIGHT ABOVE KNEE POPLITEAL GRAFT TO BELOW KNEE POPLITEAL ARTERY BYPASS WITH REVERSE SAPHENOUS VEIN  03-02-2010  . THROMBECTOMY OF RIGHT FEMORAL TO ABOVE KNEE POPLITEAL GORETEX GRAFT ANGIOPLASTY OF GORETEX AND SAPHENOUS VEIN JUNCTION AND ABOVE KNEE POPLITEAL ARTERY   10-06-2010   DR EARLY  . TONSILLECTOMY    . TRANSURETHRAL RESECTION OF BLADDER TUMOR N/A 02/21/2015   Procedure: TRANSURETHRAL RESECTION OF  BLADDER TUMOR (TURBT);  Surgeon: Festus Aloe, MD;  Location: [facility];  Service: Urology;  Laterality: N/A;  . VIDEO ASSISTED THORACOSCOPY (VATS)/WEDGE RESECTION Right 05-01-2011    FORSYTH   LOWER LOBECTOMY W/  NODE DISSECTION   Social History:  reports that he has been smoking cigarettes.  He has a 27.50 pack-year smoking history. he has never used smokeless tobacco. He reports that he does not drink alcohol or use drugs. Patient lives at *& is able to participate in activities of daily living independently *  Allergies  Allergen Reactions  . Aripiprazole Other (See Comments)    "makes him wild"  . Aspirin Other (See Comments)    Avoids -- bruises real bad  . Crestor [Rosuvastatin Calcium] Other (See Comments)    myalgia  . Effexor [Venlafaxine Hydrochloride] Itching  . Lipitor [Atorvastatin Calcium] Other (See Comments)    myalgia  . Morphine And Related Itching  . Zetia [Ezetimibe] Other (See Comments)    myalgia  . Zocor [Simvastatin - High Dose] Other (See Comments)    myalgia    Family History  Problem Relation Age of Onset  . Other  Mother 53       Abdominal Aortic Aneurysm   . Stroke Mother   . Alzheimer's disease Father 32  . Stroke Father       Prior to Admission medications   Medication Sig Start Date End Date Taking? Authorizing Provider  ADVAIR DISKUS 500-50 MCG/DOSE AEPB  03/03/17  Yes [provider]  albuterol (PROVENTIL HFA;VENTOLIN HFA) 108 (90 BASE) MCG/ACT inhaler Inhale 1 puff into the lungs every 6 (six) hours as needed for wheezing or shortness of breath.   Yes [provider]  albuterol (PROVENTIL) (5 MG/ML) 0.5% nebulizer solution Take 2.5 mg by nebulization every 6 (six) hours as needed for wheezing or shortness of breath.   Yes [provider]  amLODipine (NORVASC) 2.5 MG tablet Take 1 tablet (2.5 mg total) by mouth daily. 08/28/17 11/26/17 Yes Leonie Man, MD  Cholecalciferol (VITAMIN D3) 1000 UNITS CAPS Take 1 capsule by mouth every morning.   Yes [provider]  clonazePAM (KLONOPIN) 1 MG tablet Take 1 mg by mouth 2 (two) times daily as needed for anxiety.   Yes [provider]  DULoxetine (CYMBALTA) 60 MG capsule Take by mouth. 60 mg tablet every morning and 30 mg tablet qhs   Yes [provider]  gabapentin (NEURONTIN) 100 MG capsule Take 100 mg by mouth at bedtime.    Yes [provider]  OXYGEN Inhale into the lungs at bedtime. Uses O2 -- intolerant to CPAP   Yes [provider]  prazosin (MINIPRESS) 1 MG capsule Take 1 mg by mouth at bedtime.   Yes [provider]  QUEtiapine (SEROQUEL XR) 300 MG 24 hr tablet Take 300 mg by mouth at bedtime.    Yes [provider]  ramipril (ALTACE) 10 MG capsule Take 5 mg by mouth every morning.    Yes [provider]  tiotropium (SPIRIVA HANDIHALER) 18 MCG inhalation capsule Place 18 mcg into inhaler and inhale daily.   Yes [provider]  traZODone (DESYREL) 150 MG tablet Take 150 mg by mouth at bedtime.   Yes [provider]  zolpidem  (AMBIEN CR) 12.5 MG CR tablet Take 12.5 mg by mouth at bedtime.     Yes [provider]  nitroGLYCERIN (NITROSTAT) 0.4 MG SL tablet Place 1 tablet (0.4 mg total) under the  tongue every 5 (five) minutes as needed for chest pain. 08/28/17   Leonie Man, MD    Physical Exam: BP (!) 146/77 (BP Location: Right Arm)   Pulse 97   Temp 98 F (36.7 C) (Oral)   Resp 18   Ht 6' (1.829 m)   Wt 95.3 kg (210 lb)   SpO2 92%   BMI 28.48 kg/m   General: Tachypnea, appears dehydrated Eyes: PERRL ENT: unremarkable Neck: supple, no JVD Cardiovascular: Tachycardia Respiratory: Bilateral diffuse wheezing Abdomen: soft/NT/ND, positive bowel sounds Skin: no rash, increased skin tenting Musculoskeletal:  No edema Psychiatric: calm/cooperative Neurologic: no focal findings            Labs on Admission:  Basic Metabolic Panel: Recent Labs  Lab 09/12/17 1513  NA 141  K 3.7  CL 103  CO2 27  GLUCOSE 119*  BUN 25*  CREATININE 1.09  CALCIUM 9.0   Liver Function Tests: Recent Labs  Lab 09/12/17 1513  AST 18  ALT 22  ALKPHOS 66  BILITOT 1.1  PROT 7.4  ALBUMIN 3.9   No results for input(s): LIPASE, AMYLASE in the last 168 hours. No results for input(s): AMMONIA in the last 168 hours. CBC: Recent Labs  Lab 09/12/17 1513  WBC 10.2  NEUTROABS 8.4*  HGB 13.4  HCT 40.4  MCV 102.5*  PLT 148*   Cardiac Enzymes: No results for input(s): CKTOTAL, CKMB, CKMBINDEX, TROPONINI in the last 168 hours.  BNP (last 3 results) No results for input(s): BNP in the last 8760 hours.  ProBNP (last 3 results) No results for input(s): PROBNP in the last 8760 hours.  CBG: No results for input(s): GLUCAP in the last 168 hours.  Radiological Exams on Admission: Dg Chest 2 View  Result Date: 09/12/2017 CLINICAL DATA:  Patient came In from cardiology office with wife. C/o of SHOB and cough, worsening over the past 2 days - hx COPD - hx hypertension - hx CHF - CABG x 4 - right lung cancer  - current everyday smoker EXAM: CHEST - 2 VIEW COMPARISON:  08/15/2016 FINDINGS: normal cardiac silhouette. Chronic elevation of RIGHT hemidiaphragm. There is airspace opacity in the LEFT lower lobe. Patchy high diffuse pattern throughout the lungs which is slightly increased from prior. IMPRESSION: 1. Concern for LEFT lower lobe pneumonia. 2. More generalized patchy airspace disease may represent pulmonary edema. 3. Elevation of RIGHT hemidiaphragm. Electronically Signed   By: Suzy Bouchard M.D.   On: 09/12/2017 14:11      Assessment/Plan Present on Admission: **None**  COPD exacerbation/CAP/chronic hypoxic respiratory failure on home o2 2liter 24/7 -Per chart review he was hospitalized for similar symptom a month ago, patient report his cough has never been completely resolved, he does has history of lung cancer -Will order CTA chest to further evaluate, patient dyspnea seems to be out of proportion of lung exam.  He does has tachycardia. -Sputum culture pending collection, urine strep pneumo antigen pending collection -MRSA screen, respiratory viral panel, blood culture in process -Continue antibiotics, DuoNeb, steroid, Mucinex -He reports currently smoking 1 cigarette a day, declined nicotine patch.  History of CAD, S/P ofCABG, -Currently denies chest pain, troponin negative -He just had a stress test done today in  cardiology clinic -He appears dehydrated, no signs of volume overload. -Continue home meds  History of anxiety/ depression Currently stable, continue home meds Seroquel, Cymbalta ,PRN Ativan. Hold trazodone and Ambien  DVT prophylaxis:   Consultants: none  Code Status: full   Family Communication:  Patient   Disposition Plan: admit to med tele  Time spent: 40mins  Florencia Reasons MD, PhD [facility] Pager [phone] If 7PM-7AM, please contact night-coverage at [email or web address], password [facility]

## 2017-09-12 NOTE — ED Notes (Signed)
Respiratory called for treatment.

## 2017-09-12 NOTE — Progress Notes (Signed)
Pt gave permission to call his wife at home and ask the questions on the nursing admission history. He said he was having trouble hearing the questions that I asked. I called his wife at home and she answered the questions on the nursing admission history. John Ball BSN, RN-BC Admissions RN 09/12/2017 4:49 PM

## 2017-09-12 NOTE — ED Notes (Signed)
This nurse let patient know that we needed to collect urine

## 2017-09-12 NOTE — Progress Notes (Signed)
Pharmacy Antibiotic Note  John Ball is a 77 y.o. male admitted on 09/12/2017 with CAP.  Pharmacy has been consulted for Rocephin and Zithromax dosing.  Plan: Rocephin 1 gm IV q24 Azithromycin 500 mg po qday Pharmacy to sign off  Height: 6' (182.9 cm) Weight: 210 lb (95.3 kg) IBW/kg (Calculated) : 77.6  Temp (24hrs), Avg:98 F (36.7 C), Min:98 F (36.7 C), Max:98 F (36.7 C)  No results for input(s): WBC, CREATININE, LATICACIDVEN, VANCOTROUGH, VANCOPEAK, VANCORANDOM, GENTTROUGH, GENTPEAK, GENTRANDOM, TOBRATROUGH, TOBRAPEAK, TOBRARND, AMIKACINPEAK, AMIKACINTROU, AMIKACIN in the last 168 hours.  CrCl cannot be calculated (Patient's most recent lab result is older than the maximum 21 days allowed.).    Allergies  Allergen Reactions  . Aripiprazole Other (See Comments)    "makes him wild"  . Aspirin Other (See Comments)    Avoids -- bruises real bad  . Crestor [Rosuvastatin Calcium] Other (See Comments)    myalgia  . Effexor [Venlafaxine Hydrochloride] Itching  . Lipitor [Atorvastatin Calcium] Other (See Comments)    myalgia  . Morphine And Related Itching  . Zetia [Ezetimibe] Other (See Comments)    myalgia  . Zocor [Simvastatin - High Dose] Other (See Comments)    myalgia    Thank you for allowing pharmacy to be a part of this patient's care.  Eudelia Bunch, Pharm.D. [phone] 09/12/2017 2:48 PM

## 2017-09-12 NOTE — ED Notes (Signed)
ED TO INPATIENT HANDOFF REPORT  Name/Age/Gender John Ball 77 y.o. male  Code Status Code Status History    Date Active Date Inactive Code Status Order ID Comments User Context   07/21/2016 23:18 07/23/2016 16:00 Full Code [id number]  Ivor Costa, MD Inpatient   07/12/2014 13:14 07/12/2014 20:02 Full Code [id number]  Troy Sine, MD Inpatient   01/10/2014 20:29 01/12/2014 13:32 Full Code [id number]  Natividad Brood Inpatient   01/06/2014 17:41 01/08/2014 16:07 Full Code [id number]  Markus Daft, MD Inpatient    Advance Directive Documentation     Most Recent Value  Type of Advance Directive  Healthcare Power of Attorney, Living will  Pre-existing out of facility DNR order (yellow form or pink MOST form)  No data  "MOST" Form in Place?  No data      Home/SNF/Other Home  Chief Complaint shortness of breath  Level of Care/Admitting Diagnosis ED Disposition    ED Disposition Condition Comment   Admit  Hospital Area: [place] [[id number]]  Level of Care: Telemetry [5]  Admit to tele based on following criteria: Monitor for Ischemic changes  Diagnosis: SOB (shortness of breath) [832549]  Admitting Physician: Florencia Reasons [[id number]]  Attending Physician: Florencia Reasons [[id number]]  Estimated length of stay: past midnight tomorrow  Certification:: I certify this patient will need inpatient services for at least 2 midnights  PT Class (Do Not Modify): Inpatient [101]  PT Acc Code (Do Not Modify): Private [1]       Medical History Past Medical History:  Diagnosis Date  . Anxiety   . Arthritis   . Complication of anesthesia    hx prolonged post op oxygen dependent/  hx hallucinations for 2 day post op lobectomy 2012  . COPD, severe ([place])   . Depression   . Essential hypertension   . Hearing loss of both ears    does wear his hearing aids  . History of arterial bypass of lower extremity    RIGHT FEM-POP  . History of CHF (congestive heart failure)   . History of DVT  of lower extremity   . History of panic attacks   . Hyperlipidemia with target LDL less than 70   . Left main coronary artery disease 2004   a) Severe LM CAD 2004 -->s/p  cabg x4 (LIMA-LAD, free RIMA-RI, SVG-OM 2, SVG-RPDA); b) 06/2014: Abnormal Myoview --> c)cardiac Cath Jan 2016: pLM 70%, pLAD 40-50%- patent LIMA-LAD.  RI -competitive flow noted from RIMA-RI graft, circumflex, normal caliber with moderate OM1.  OM 2 occluded.  SVG-OM 2 patent. RCA CTO after large RV M, faint R-R and LAD septal-PDA collaterals =--> new  . Neuropathy, peripheral   . 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 Nocturia   . OSA (obstructive sleep apnea)    uses O2  via [place]  --  intolerate to CPAP  . Peripheral arterial occlusive disease ([place]) followed by dr Early--  per last dopplers 06-28-2014 no change right graft,  50-74% stenosis common and left mid superficial femoral artery   2011   a) Gore-Tex graft right AK popA-BK popA --> b) 2012: thrombosed graft --> thrombectomy with dacron patch = 1 V runnof via Peroneal; c) 01/2014: R femoropopliteal bypass with left femoral vein  . PTSD (post-traumatic stress disorder)    anxiety attack's---  [place] [employer]  . Recurrent productive cough    SMOKER'S COUGH  . 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  Medical management  . Smoker unmotivated to quit    Reportedly he came close to having a nervous breakdown when he last tried to quit  . 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 Wears glasses     Allergies Allergies  Allergen Reactions  . Aripiprazole Other (See Comments)    "makes him wild"  . Aspirin Other (See Comments)    Avoids -- bruises real bad  . Crestor [Rosuvastatin Calcium] Other (See Comments)    myalgia  . Effexor [Venlafaxine Hydrochloride] Itching  . Lipitor [Atorvastatin Calcium] Other (See Comments)    myalgia  . Morphine And Related Itching  . Zetia [Ezetimibe] Other (See Comments)    myalgia  . Zocor [Simvastatin - High Dose] Other (See Comments)    myalgia    IV Location/Drains/Wounds Patient Lines/Drains/Airways Status   Active Line/Drains/Airways    Name:   Placement date:   Placement time:   Site:   Days:   Peripheral IV 09/12/17 Left Antecubital   09/12/17    1500    Antecubital   less than 1   Peripheral IV 09/12/17 Left Hand   09/12/17    1505    Hand   less than 1          Labs/Imaging Results for orders placed or performed during the hospital encounter of 09/12/17 (from the past 48 hour(s))  Brain natriuretic peptide     Status: None   Collection Time: 09/12/17  3:00 PM  Result Value Ref Range   B Natriuretic Peptide 73.2 0.0 - 100.0 pg/mL    Comment: Performed at [facility], [place] [address] [place], [place] [address]  Comprehensive metabolic panel     Status: Abnormal   Collection Time: 09/12/17  3:13 PM  Result Value Ref Range   Sodium 141 135 - 145 mmol/L   Potassium 3.7 3.5 - 5.1 mmol/L   Chloride 103 101 - 111 mmol/L   CO2 27 22 - 32 mmol/L   Glucose, Bld 119 (H) 65 - 99 mg/dL   BUN 25 (H) 6 - 20 mg/dL   Creatinine, Ser 1.09 0.61 - 1.24 mg/dL   Calcium 9.0 8.9 - 10.3 mg/dL   Total Protein 7.4 6.5 - 8.1 g/dL   Albumin 3.9 3.5 - 5.0 g/dL   AST 18 15 - 41 U/L   ALT 22 17 - 63 U/L   Alkaline Phosphatase 66 38 - 126 U/L   Total Bilirubin 1.1 0.3 - 1.2  mg/dL   GFR calc non Af Amer >60 >60 mL/min   GFR calc Af Amer >60 >60 mL/min    Comment: (NOTE) The eGFR has been calculated using the CKD EPI equation. This calculation has not been validated in all clinical situations. eGFR's persistently <60 mL/min  signify possible Chronic Kidney Disease.    Anion gap 11 5 - 15    Comment: Performed at [facility], [place] [address], [place], [place] [address]  CBC WITH DIFFERENTIAL     Status: Abnormal   Collection Time: 09/12/17  3:13 PM  Result Value Ref Range   WBC 10.2 4.0 - 10.5 K/uL   RBC 3.94 (L) 4.22 - 5.81 MIL/uL   Hemoglobin 13.4 13.0 - 17.0 g/dL   HCT 40.4 39.0 - 52.0 %   MCV 102.5 (H) 78.0 - 100.0 fL   MCH 34.0 26.0 - 34.0 pg   MCHC 33.2 30.0 - 36.0 g/dL   RDW 14.0 11.5 - 15.5 %   Platelets 148 (L) 150 - 400 K/uL   Neutrophils Relative % 82 %   Neutro Abs 8.4 (H) 1.7 - 7.7 K/uL   Lymphocytes Relative 9 %   Lymphs Abs 1.0 0.7 - 4.0 K/uL   Monocytes Relative 8 %   Monocytes Absolute 0.8 0.1 - 1.0 K/uL   Eosinophils Relative 1 %   Eosinophils Absolute 0.1 0.0 - 0.7 K/uL   Basophils Relative 0 %   Basophils Absolute 0.0 0.0 - 0.1 K/uL    Comment: Performed at [facility], [place] [address], [place], [place] [address]  I-stat troponin, ED     Status: None   Collection Time: 09/12/17  3:17 PM  Result Value Ref Range   Troponin i, poc 0.05 0.00 - 0.08 ng/mL   Comment 3            Comment: Due to the release kinetics of cTnI, a negative result within the first hours of the onset of symptoms does not rule out myocardial infarction with certainty. If myocardial infarction is still suspected, repeat the test at appropriate intervals.   Influenza panel by PCR (type A & B)     Status: None   Collection Time: 09/12/17  5:00 PM  Result Value Ref Range   Influenza A By PCR NEGATIVE NEGATIVE   Influenza B By PCR NEGATIVE NEGATIVE    Comment: (NOTE) The Xpert Xpress Flu assay is intended as an aid  in the diagnosis of  influenza and should not be used as a sole basis for treatment.  This  assay is FDA approved for nasopharyngeal swab specimens only. Nasal  washings and aspirates are unacceptable for Xpert Xpress Flu testing. Performed at [facility], [place] [address] [place], [place] [address]   I-Stat CG4 Lactic Acid, ED  (not at  [facility])     Status: None   Collection Time: 09/12/17  5:01 PM  Result Value Ref Range   Lactic Acid, Venous 0.95 0.5 - 1.9 mmol/L  Urinalysis, Routine w reflex microscopic     Status: None   Collection Time: 09/12/17  5:52 PM  Result Value Ref Range   Color, Urine YELLOW YELLOW   APPearance CLEAR CLEAR   Specific Gravity, Urine 1.021 1.005 - 1.030   pH 5.0 5.0 - 8.0   Glucose, UA NEGATIVE NEGATIVE mg/dL   Hgb urine dipstick NEGATIVE NEGATIVE   Bilirubin Urine NEGATIVE NEGATIVE   Ketones, ur NEGATIVE NEGATIVE mg/dL   Protein, ur NEGATIVE NEGATIVE mg/dL   Nitrite NEGATIVE NEGATIVE   Leukocytes, UA NEGATIVE NEGATIVE    Comment: Performed at [facility] [address], [place], [place] [address]   Dg Chest 2 View  Result Date: 09/12/2017 CLINICAL DATA:  Patient came In from cardiology office  with wife. C/o of SHOB and cough, worsening over the past 2 days - hx COPD - hx hypertension - hx CHF - CABG x 4 - right lung cancer - current everyday smoker EXAM: CHEST - 2 VIEW COMPARISON:  08/15/2016 FINDINGS: normal cardiac silhouette. Chronic elevation of RIGHT hemidiaphragm. There is airspace opacity in the LEFT lower lobe. Patchy high diffuse pattern throughout the lungs which is slightly increased from prior. IMPRESSION: 1. Concern for LEFT lower lobe pneumonia. 2. More generalized patchy airspace disease may represent pulmonary edema. 3. Elevation of RIGHT hemidiaphragm. Electronically Signed   By: Suzy Bouchard M.D.   On: 09/12/2017 14:11    Pending Labs Unresulted Labs (From admission, onward)   Start     Ordered    09/12/17 1630  Strep pneumoniae urinary antigen  Once,   R     09/12/17 1629   09/12/17 1630  Respiratory Panel by PCR  (Respiratory virus panel)  Once,   R     09/12/17 1629   09/12/17 1629  Culture, expectorated sputum-assessment  Once,   R     09/12/17 1628   09/12/17 1629  MRSA PCR Screening  Once,   R     09/12/17 1628   09/12/17 1445  Blood Culture (routine x 2)  BLOOD CULTURE X 2,   STAT     09/12/17 1444   Signed and Held  CBC  (enoxaparin (LOVENOX)    CrCl >/= 30 ml/min)  Once,   R    Comments:  Baseline for enoxaparin therapy IF NOT ALREADY DRAWN.  Notify MD if PLT < 100 K.    Signed and Held   Signed and Held  Creatinine, serum  (enoxaparin (LOVENOX)    CrCl >/= 30 ml/min)  Once,   R    Comments:  Baseline for enoxaparin therapy IF NOT ALREADY DRAWN.    Signed and Held   Signed and Held  Creatinine, serum  (enoxaparin (LOVENOX)    CrCl >/= 30 ml/min)  Weekly,   R    Comments:  while on enoxaparin therapy    Signed and Held   Signed and Held  Comprehensive metabolic panel  Tomorrow morning,   R     Signed and Held   Signed and Held  CBC  Tomorrow morning,   R     Signed and Held      Vitals/Pain Today's Vitals   09/12/17 1329 09/12/17 1435 09/12/17 1502 09/12/17 1746  BP:    (!) 153/81  Pulse:    (!) 55  Resp:    18  Temp:      TempSrc:      SpO2:  92%  95%  Weight: 210 lb (95.3 kg)     Height: 6' (1.829 m)     PainSc:   0-No pain     Isolation Precautions Droplet precaution  Medications Medications  cefTRIAXone (ROCEPHIN) 1 g in sodium chloride 0.9 % 100 mL IVPB (not administered)  azithromycin (ZITHROMAX) tablet 500 mg (not administered)  ipratropium-albuterol (DUONEB) 0.5-2.5 (3) MG/3ML nebulizer solution 3 mL (3 mLs Nebulization Given 09/12/17 1807)  guaiFENesin (MUCINEX) 12 hr tablet 600 mg (not administered)  mometasone-formoterol (DULERA) 200-5 MCG/ACT inhaler 2 puff (not administered)  albuterol (PROVENTIL) (2.5 MG/3ML) 0.083% nebulizer solution  3 mL (not administered)  clonazePAM (KLONOPIN) tablet 1 mg (not administered)  gabapentin (NEURONTIN) capsule 100 mg (not administered)  nitroGLYCERIN (NITROSTAT) SL tablet 0.4 mg (not administered)  prazosin (MINIPRESS) capsule 1 mg (not  administered)  QUEtiapine (SEROQUEL XR) 24 hr tablet 300 mg (not administered)  tiotropium (SPIRIVA) inhalation capsule 18 mcg (not administered)  hydrALAZINE (APRESOLINE) injection 10 mg (not administered)  DULoxetine (CYMBALTA) DR capsule 60 mg (not administered)    And  DULoxetine (CYMBALTA) DR capsule 30 mg (not administered)  albuterol (PROVENTIL) (2.5 MG/3ML) 0.083% nebulizer solution 5 mg (5 mg Nebulization Given 09/12/17 1435)  cefTRIAXone (ROCEPHIN) 1 g in sodium chloride 0.9 % 100 mL IVPB (0 g Intravenous Stopped 09/12/17 1551)  azithromycin (ZITHROMAX) 500 mg in sodium chloride 0.9 % 250 mL IVPB (0 mg Intravenous Stopped 09/12/17 1658)  ipratropium-albuterol (DUONEB) 0.5-2.5 (3) MG/3ML nebulizer solution 3 mL (3 mLs Nebulization Given 09/12/17 1514)  methylPREDNISolone sodium succinate (SOLU-MEDROL) 125 mg/2 mL injection 125 mg (125 mg Intravenous Given 09/12/17 1649)    Mobility walks

## 2017-09-13 DIAGNOSIS — J440 Chronic obstructive pulmonary disease with acute lower respiratory infection: Secondary | ICD-10-CM | POA: Diagnosis not present

## 2017-09-13 DIAGNOSIS — I2510 Atherosclerotic heart disease of native coronary artery without angina pectoris: Secondary | ICD-10-CM | POA: Diagnosis not present

## 2017-09-13 DIAGNOSIS — F17210 Nicotine dependence, cigarettes, uncomplicated: Secondary | ICD-10-CM | POA: Diagnosis not present

## 2017-09-13 DIAGNOSIS — J441 Chronic obstructive pulmonary disease with (acute) exacerbation: Secondary | ICD-10-CM | POA: Diagnosis not present

## 2017-09-13 DIAGNOSIS — F17200 Nicotine dependence, unspecified, uncomplicated: Secondary | ICD-10-CM

## 2017-09-13 DIAGNOSIS — J9611 Chronic respiratory failure with hypoxia: Secondary | ICD-10-CM

## 2017-09-13 DIAGNOSIS — J181 Lobar pneumonia, unspecified organism: Secondary | ICD-10-CM | POA: Diagnosis not present

## 2017-09-13 LAB — RESPIRATORY PANEL BY PCR
Adenovirus: NOT DETECTED
Bordetella pertussis: NOT DETECTED
CORONAVIRUS 229E-RVPPCR: NOT DETECTED
CORONAVIRUS OC43-RVPPCR: NOT DETECTED
Chlamydophila pneumoniae: NOT DETECTED
Coronavirus HKU1: NOT DETECTED
Coronavirus NL63: NOT DETECTED
INFLUENZA B-RVPPCR: NOT DETECTED
Influenza A: NOT DETECTED
METAPNEUMOVIRUS-RVPPCR: NOT DETECTED
MYCOPLASMA PNEUMONIAE-RVPPCR: NOT DETECTED
PARAINFLUENZA VIRUS 1-RVPPCR: NOT DETECTED
PARAINFLUENZA VIRUS 4-RVPPCR: NOT DETECTED
Parainfluenza Virus 2: NOT DETECTED
Parainfluenza Virus 3: NOT DETECTED
RESPIRATORY SYNCYTIAL VIRUS-RVPPCR: NOT DETECTED
Rhinovirus / Enterovirus: NOT DETECTED

## 2017-09-13 LAB — COMPREHENSIVE METABOLIC PANEL
ALBUMIN: 3.5 g/dL (ref 3.5–5.0)
ALT: 20 U/L (ref 17–63)
AST: 16 U/L (ref 15–41)
Alkaline Phosphatase: 59 U/L (ref 38–126)
Anion gap: 12 (ref 5–15)
BILIRUBIN TOTAL: 0.8 mg/dL (ref 0.3–1.2)
BUN: 23 mg/dL — AB (ref 6–20)
CHLORIDE: 104 mmol/L (ref 101–111)
CO2: 24 mmol/L (ref 22–32)
Calcium: 9 mg/dL (ref 8.9–10.3)
Creatinine, Ser: 1.02 mg/dL (ref 0.61–1.24)
GFR calc Af Amer: >60 mL/min (ref >60)
GFR calc non Af Amer: >60 mL/min (ref >60)
GLUCOSE: 228 mg/dL — AB (ref 65–99)
POTASSIUM: 3.7 mmol/L (ref 3.5–5.1)
Sodium: 140 mmol/L (ref 135–145)
Total Protein: 6.9 g/dL (ref 6.5–8.1)

## 2017-09-13 LAB — CBC
HEMATOCRIT: 37 % — AB (ref 39.0–52.0)
Hemoglobin: 12.1 g/dL — ABNORMAL LOW (ref 13.0–17.0)
MCH: 33.5 pg (ref 26.0–34.0)
MCHC: 32.7 g/dL (ref 30.0–36.0)
MCV: 102.5 fL — AB (ref 78.0–100.0)
Platelets: 157 10*3/uL (ref 150–400)
RBC: 3.61 MIL/uL — ABNORMAL LOW (ref 4.22–5.81)
RDW: 14 % (ref 11.5–15.5)
WBC: 8.6 10*3/uL (ref 4.0–10.5)

## 2017-09-13 LAB — STREP PNEUMONIAE URINARY ANTIGEN: Strep Pneumo Urinary Antigen: NEGATIVE

## 2017-09-13 MED ORDER — NICOTINE 21 MG/24HR TD PT24: 21.0000 mg | MEDICATED_PATCH | Freq: Every day | TRANSDERMAL | Status: DC

## 2017-09-13 MED ORDER — AMOXICILLIN-POT CLAVULANATE 875-125 MG PO TABS: 1.0000 | ORAL_TABLET | Freq: Two times a day (BID) | ORAL | 0 refills | Status: AC

## 2017-09-13 MED ORDER — GUAIFENESIN ER 600 MG PO TB12: 600.0000 mg | ORAL_TABLET | Freq: Two times a day (BID) | ORAL | 0 refills | Status: DC

## 2017-09-13 MED ORDER — NICOTINE 21 MG/24HR TD PT24
21.0000 mg | MEDICATED_PATCH | Freq: Every day | TRANSDERMAL | Status: DC
  Administered 2017-09-13: 21 mg via TRANSDERMAL
  Filled 2017-09-13 (×2): qty 1

## 2017-09-13 MED ORDER — TRAZODONE HCL 150 MG PO TABS: 75.0000 mg | ORAL_TABLET | Freq: Every evening | ORAL | 0 refills | Status: DC | PRN

## 2017-09-13 MED ORDER — PREDNISONE 10 MG PO TABS: ORAL_TABLET | ORAL | 0 refills | Status: DC

## 2017-09-13 NOTE — Progress Notes (Signed)
Patient was contacted regarding prescriptions left in the pharmacy. Spoke to pts spouse whom asked if medications could be thrown away. pts spouse stated that she would not be returning to collect medications Cymbalta and Seroquel.

## 2017-09-13 NOTE — Progress Notes (Addendum)
D/C was postponed to infuse last IV antibiotic. No changes from am assessment. Pt is out of bed without assistance, a&ox4.Pt and spouse are eager to discharge. Discharge instruction reviewed with pt and spouse. Hard copy prescription provided. Questions, concerns denied.

## 2017-09-13 NOTE — Discharge Summary (Signed)
Discharge Summary  [name] CXK:[id number] DOB: 1941-02-03  PCP: Mellody Dance, DO  Admit date: 09/12/2017 Discharge date: 09/13/2017  Time spent: <22mins  Recommendations for Outpatient Follow-up:  1. F/u with PMD within a week  for hospital discharge follow up, repeat cbc/bmp at follow up. pmd to follow up on sputum culture and blood culture result 2. F/u with pulmonology Dr Halford Chessman 3. F/u with cardiology  Discharge Diagnoses:  Active Hospital Problems   Diagnosis Date Noted  . SOB (shortness of breath) 09/12/2017    Resolved Hospital Problems  No resolved problems to display.    Discharge Condition: stable  Diet recommendation: heart healthy  Filed Weights   09/12/17 1329 09/12/17 1845  Weight: 95.3 kg (210 lb) 95.3 kg (210 lb)    History of present illness:  PCP: Mellody Dance, DO   Chief Complaint: Productive cough, wheezing  HPI: John Ball is a 77 y.o. male   H/o COPD, on 2 L oxygen at home, squamous cell lung cancer (stage IA, s/p of right lobectomy, no further treatment needed), h/o CAD, S/P ofCABG, OSA not on CPAP, PVD, h/o DVT (currently not on anticoagulation), h/o PTSD, severe hearing loss bilaterally,  who presents with shortness breath and cough.  He was seen at cardiology clinic for stress test, he is sent to the ED due to productive cough and wheezing.  ED course: On presentation he has tachypnea, respiration rate 25  With audible wheezing ,intermittent productive cough, no fever, does have sinus tachycardia, no hypoxia on 2 L of oxygen. Blood pressure stable.  He denies chest pain, no edema.  Basic lab work unremarkable, no leukocytosis, lactic acid 0.9, BNP 73, troponin  0.05.  EKG personally reviewed, with sinus rhythm a few PVCs, T wave flattening and inversion lateral leads. Cxr: "1. Concern for LEFT lower lobe pneumonia. 2. More generalized patchy airspace disease may represent pulmonary Edema. 3. Elevation of RIGHT  hemidiaphragm."  Blood culture and  respiratory viral panel obtained in the ED. He  is started on Rocephin and Zithromax, he received breathing treatment and 1 dose of steroid.  Hospitalist called to admit the patient.    Hospital Course:  Active Problems:   SOB (shortness of breath)   COPD exacerbation/CAP/chronic hypoxic respiratory failure on home o2 2liter 24/7 - he does has history of lung cancer -He presented with tachycardia, tachypnea and wheezing. -CTA chest  1. No acute pulmonary embolism. 2. RIGHT greater than LEFT tree-in-bud infiltrates likely infectious, less likely inflammatory with RIGHT middle lobe atelectasis versus early pneumonia. 3. Status post RIGHT lower lobectomy. Increasing RIGHT hilar lymphadenopathy, potentially reactive though, recurrent neoplasm not excluded. Recommend close attention on follow-up imaging  - urine strep pneumo antigen negative. Flu swab negative. mrsa screening negative.  -Sputum culture in process, spiratory viral panel in process, blood culture in process -he received rocephin and zithro, steroids  in the hospital -Tachycardia has resolved ,wheezing resolved, No hypoxia on 2 L.  I have advised him to stay in hospital for 1 more day, especially culture results still pending and he remained tachypneic even at rest. he is adamant about going home.  He stated that he does not at home as well . his wife feels she can take care of the patient at home as well. -he is discharged on augmentin and steroids taper, close follow up with pmd and pulmonology. -He reports currently smoke half a pack a day, declined nicotine patch. He is not ready to quit smoking.  History  of CAD, S/P ofCABG, -Currently denies chest pain, troponin negative -He just had a stress test done today in  cardiology clinic -He appears dehydrated, no signs of volume overload. -Continue home meds, close follow up with cardiology.  History of anxiety/ depression Currently  stable, continue home meds Seroquel, Cymbalta ,PRN Ativan. Hold trazodone and Ambien in the hospital, advised to taper trazodone and ambien. Wife expressed understanding.    Consultants: none  Code Status: full   Family Communication:  Patient and wife at bedside     Procedures:  none  Consultations:  none  Discharge Exam: BP 132/68 (BP Location: Right Arm)   Pulse 82   Temp 98.7 F (37.1 C) (Oral)   Resp (!) 24   Ht 6' (1.829 m)   Wt 95.3 kg (210 lb)   SpO2 96% Comment: Pt has a cont. pulse ox  BMI 28.48 kg/m   General: tachypnea Cardiovascular: RRR Respiratory: wheezing has resolved, no rhonchi, no rale.   Discharge Instructions You were cared for by a hospitalist during your hospital stay. If you have any questions about your discharge medications or the care you received while you were in the hospital after you are discharged, you can call the unit and asked to speak with the hospitalist on call if the hospitalist that took care of you is not available. Once you are discharged, your primary care physician will handle any further medical issues. Please note that NO REFILLS for any discharge medications will be authorized once you are discharged, as it is imperative that you return to your primary care physician (or establish a relationship with a primary care physician if you do not have one) for your aftercare needs so that they can reassess your need for medications and monitor your lab values.  Discharge Instructions    Diet - low sodium heart healthy   Complete by:  As directed    Increase activity slowly   Complete by:  As directed      Allergies as of 09/13/2017      Reactions   Aripiprazole Other (See Comments)   "makes him wild"   Aspirin Other (See Comments)   Avoids -- bruises real bad   Crestor [rosuvastatin Calcium] Other (See Comments)   myalgia   Effexor [venlafaxine Hydrochloride] Itching   Lipitor [atorvastatin Calcium] Other (See  Comments)   myalgia   Morphine And Related Itching   Zetia [ezetimibe] Other (See Comments)   myalgia   Zocor [simvastatin - High Dose] Other (See Comments)   myalgia      Medication List    TAKE these medications   ADVAIR DISKUS 500-50 MCG/DOSE Aepb Generic drug:  Fluticasone-Salmeterol   albuterol (5 MG/ML) 0.5% nebulizer solution Commonly known as:  PROVENTIL Take 2.5 mg by nebulization every 6 (six) hours as needed for wheezing or shortness of breath.   albuterol 108 (90 Base) MCG/ACT inhaler Commonly known as:  PROVENTIL HFA;VENTOLIN HFA Inhale 1 puff into the lungs every 6 (six) hours as needed for wheezing or shortness of breath.   AMBIEN CR 12.5 MG CR tablet Generic drug:  zolpidem Take 12.5 mg by mouth at bedtime.   amLODipine 2.5 MG tablet Commonly known as:  NORVASC Take 1 tablet (2.5 mg total) by mouth daily.   amoxicillin-clavulanate 875-125 MG tablet Commonly known as:  AUGMENTIN Take 1 tablet by mouth 2 (two) times daily for 7 days.   clonazePAM 1 MG tablet Commonly known as:  KLONOPIN Take 1 mg by mouth  2 (two) times daily as needed for anxiety.   DULoxetine 30 MG capsule Commonly known as:  CYMBALTA Take 30-60 mg by mouth 2 (two) times daily. 60mg  (2 capsules) every morning and 30mg  (1 capsule) daily at noon   gabapentin 100 MG capsule Commonly known as:  NEURONTIN Take 100 mg by mouth at bedtime.   guaiFENesin 600 MG 12 hr tablet Commonly known as:  MUCINEX Take 1 tablet (600 mg total) by mouth 2 (two) times daily.   nitroGLYCERIN 0.4 MG SL tablet Commonly known as:  NITROSTAT Place 1 tablet (0.4 mg total) under the tongue every 5 (five) minutes as needed for chest pain.   OXYGEN Inhale into the lungs at bedtime. Uses O2 -- intolerant to CPAP   prazosin 1 MG capsule Commonly known as:  MINIPRESS Take 1 mg by mouth at bedtime.   predniSONE 10 MG tablet Commonly known as:  DELTASONE Label  & dispense according to the schedule below. 6  Pills PO on day one then, 5 Pills PO on day two, 4 Pills PO on day three, 3Pills PO on day four, 2 Pills PO on day five, 1 Pills PO on day six,  then STOP.  Total of 21 tabs   QUEtiapine 300 MG 24 hr tablet Commonly known as:  SEROQUEL XR Take 300 mg by mouth at bedtime.   ramipril 10 MG capsule Commonly known as:  ALTACE Take 5 mg by mouth every morning.   SPIRIVA HANDIHALER 18 MCG inhalation capsule Generic drug:  tiotropium Place 18 mcg into inhaler and inhale daily.   traZODone 150 MG tablet Commonly known as:  DESYREL Take 0.5 tablets (75 mg total) by mouth at bedtime as needed for sleep. What changed:    how much to take  when to take this  reasons to take this   Vitamin D3 1000 units Caps Take 1 capsule by mouth every morning.      Allergies  Allergen Reactions  . Aripiprazole Other (See Comments)    "makes him wild"  . Aspirin Other (See Comments)    Avoids -- bruises real bad  . Crestor [Rosuvastatin Calcium] Other (See Comments)    myalgia  . Effexor [Venlafaxine Hydrochloride] Itching  . Lipitor [Atorvastatin Calcium] Other (See Comments)    myalgia  . Morphine And Related Itching  . Zetia [Ezetimibe] Other (See Comments)    myalgia  . Zocor [Simvastatin - High Dose] Other (See Comments)    myalgia   Follow-up Information    Opalski, Deborah, DO Follow up in 1 week(s).   Specialty:  Family Medicine Why:  hospital discharge follow up. repeat cbc/bmp at follow up. pmd to follow up on final blood culture and sputum culture result. Contact information: [place] [address] [phone]        Chesley Mires, MD Follow up in 1 week(s).   Specialty:  Pulmonary Disease Why:  copd management Contact information: [address] [address] [phone]        [facility]   Why:  to discuss taper trazodone and ambien            The results of significant diagnostics from this  hospitalization (including imaging, microbiology, ancillary and laboratory) are listed below for reference.    Significant Diagnostic Studies: Dg Chest 2 View  Result Date: 09/12/2017 CLINICAL DATA:  Patient came In from cardiology office with wife. C/o of SHOB and cough, worsening over the  past 2 days - hx COPD - hx hypertension - hx CHF - CABG x 4 - right lung cancer - current everyday smoker EXAM: CHEST - 2 VIEW COMPARISON:  08/15/2016 FINDINGS: normal cardiac silhouette. Chronic elevation of RIGHT hemidiaphragm. There is airspace opacity in the LEFT lower lobe. Patchy high diffuse pattern throughout the lungs which is slightly increased from prior. IMPRESSION: 1. Concern for LEFT lower lobe pneumonia. 2. More generalized patchy airspace disease may represent pulmonary edema. 3. Elevation of RIGHT hemidiaphragm. Electronically Signed   By: Suzy Bouchard M.D.   On: 09/12/2017 14:11   Ct Angio Chest Pe W Or Wo Contrast  Result Date: 09/12/2017 CLINICAL DATA:  Shortness of breath. History of lung cancer and RIGHT lower lobectomy 2012, CHF, hypertension, severe COPD. EXAM: CT ANGIOGRAPHY CHEST WITH CONTRAST TECHNIQUE: Multidetector CT imaging of the chest was performed using the standard protocol during bolus administration of intravenous contrast. Multiplanar CT image reconstructions and MIPs were obtained to evaluate the vascular anatomy. CONTRAST:  178mL ISOVUE-370 IOPAMIDOL (ISOVUE-370) INJECTION 76% COMPARISON:  Chest radiograph September 12, 2016 and CT chest May 02, 2014 FINDINGS: CARDIOVASCULAR: Adequate contrast opacification of the pulmonary artery's. Main pulmonary artery is not enlarged. No pulmonary arterial filling defects to the level of the segmental branches. Moderate respiratory motion limits sensitivity for subsegmental emboli. Heart size is normal. Severe coronary artery calcifications. Status post median sternotomy for CABG. No pericardial effusion. No pericardial effusion. Thoracic  aorta is normal course and caliber, mild calcific atherosclerosis. MEDIASTINUM/NODES: Enlarging RIGHT perihilar low-density lymphadenopathy measuring 2.7 x 2.2 cm, previously 1.7 x 2 cm. Subcentimeter pretracheal, LEFT hilar lymph nodes. LUNGS/PLEURA: Tracheobronchial tree is patent, no pneumothorax. 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 Status post RIGHT lower lobectomy with surgical clips at the hilum. Diffuse tree-in-bud infiltrates throughout RIGHT lung with patchy consolidation RIGHT middle lobe. Tree-in-bud infiltrates dependent lingula and, within LEFT lower lobe without focal consolidation. No pleural effusion. UPPER ABDOMEN: Mildly elevated RIGHT hemidiaphragm. Tiny layering gallbladder sludge versus stones without acute cholecystitis, incompletely assessed. MUSCULOSKELETAL: Progressive severe atrophy RIGHT deltoid muscle. Respiratory motion results in spurious appearance of sternal fracture. The Review of the MIP images confirms the above findings. IMPRESSION: 1. No acute pulmonary embolism. 2. RIGHT greater than LEFT tree-in-bud infiltrates likely infectious, less likely inflammatory with RIGHT middle lobe atelectasis versus early pneumonia. 3. Status post RIGHT lower lobectomy. Increasing RIGHT hilar lymphadenopathy, potentially reactive though, recurrent neoplasm not excluded. Recommend close attention on follow-up imaging. Aortic Atherosclerosis (ICD10-I70.0) and Emphysema (ICD10-J43.9). Electronically Signed   By: Elon Alas M.D.   On: 09/12/2017 21:34    Microbiology: Recent Results (from the past 240 hour(s))  MRSA PCR Screening     Status: None   Collection Time: 09/12/17  5:00 PM  Result Value Ref Range Status   MRSA by PCR NEGATIVE NEGATIVE Final    Comment:        The GeneXpert MRSA Assay (FDA approved for NASAL specimens only), is one component of a comprehensive MRSA colonization surveillance program. It is not intended to diagnose MRSA infection nor to  guide or monitor treatment for MRSA infections. Performed at [facility], [place] [address] [place], [place] [address]   Culture, expectorated sputum-assessment     Status: None   Collection Time: 09/12/17  7:02 PM  Result Value Ref Range Status   Specimen Description EXPECTORATED SPUTUM  Final   Special Requests NONE  Final   Sputum evaluation   Final    THIS  SPECIMEN IS ACCEPTABLE FOR SPUTUM CULTURE Performed at [facility], [place] [address] [place], [place] [address]    Report Status 09/12/2017 FINAL  Final  Culture, respiratory (NON-Expectorated)     Status: None (Preliminary result)   Collection Time: 09/12/17  7:02 PM  Result Value Ref Range Status   Specimen Description   Final    EXPECTORATED SPUTUM Performed at [place] [address] [place], [place] [address]    Special Requests   Final    NONE Reflexed from [id number] Performed at [facility], [place] [address] [place], [place] [address]    Gram Stain   Final    ABUNDANT WBC PRESENT, PREDOMINANTLY PMN MODERATE GRAM POSITIVE COCCI FEW GRAM NEGATIVE RODS    Culture   Final    CULTURE REINCUBATED FOR BETTER GROWTH Performed at [facility], [place] [address] [place], [place] [address]    Report Status PENDING  Incomplete     Labs: Basic Metabolic Panel: Recent Labs  Lab 09/12/17 1513 09/13/17 0518  NA 141 140  K 3.7 3.7  CL 103 104  CO2 27 24  GLUCOSE 119* 228*  BUN 25* 23*  CREATININE 1.09 1.02  CALCIUM 9.0 9.0   Liver Function Tests: Recent Labs  Lab 09/12/17 1513 09/13/17 0518  AST 18 16  ALT 22 20  ALKPHOS 66 59  BILITOT 1.1 0.8  PROT 7.4 6.9  ALBUMIN 3.9 3.5   No results for input(s): LIPASE, AMYLASE in the last 168 hours. No results for input(s): AMMONIA in the last 168 hours. CBC: Recent Labs  Lab 09/12/17 1513 09/13/17 0518  WBC 10.2 8.6  NEUTROABS 8.4*  --   HGB 13.4 12.1*  HCT 40.4 37.0*  MCV  102.5* 102.5*  PLT 148* 157   Cardiac Enzymes: No results for input(s): CKTOTAL, CKMB, CKMBINDEX, TROPONINI in the last 168 hours. BNP: BNP (last 3 results) Recent Labs    09/12/17 1500  BNP 73.2    ProBNP (last 3 results) No results for input(s): PROBNP in the last 8760 hours.  CBG: No results for input(s): GLUCAP in the last 168 hours.     Signed:  Florencia Reasons MD, PhD  [facility] 09/13/2017, 11:55 AM

## 2017-09-15 LAB — CULTURE, RESPIRATORY W GRAM STAIN: Culture: NORMAL

## 2017-09-17 LAB — CULTURE, BLOOD (ROUTINE X 2)
Culture: NO GROWTH
Culture: NO GROWTH
SPECIAL REQUESTS: ADEQUATE

## 2017-09-18 ENCOUNTER — Telehealth: Payer: Self-pay | Admitting: *Deleted

## 2017-09-18 DIAGNOSIS — R0602 Shortness of breath: Secondary | ICD-10-CM

## 2017-09-18 DIAGNOSIS — I208 Other forms of angina pectoris: Secondary | ICD-10-CM

## 2017-09-18 DIAGNOSIS — R9439 Abnormal result of other cardiovascular function study: Secondary | ICD-10-CM

## 2017-09-18 NOTE — Telephone Encounter (Signed)
WILL CONTACT PATIENT  REVIEWED VIA  MYCHART  ECHO ORDERED

## 2017-09-18 NOTE — Telephone Encounter (Signed)
-----   Message from Leonie Man, MD sent at 09/17/2017  2:57 PM EDT ----- Nuclear stress test result looks very similar from an imaging standpoint as the previous one where we did a heart catheterization showing that the right coronary artery and its grafts were closed.  The major difference is that the pump function has decreased.  This is read as intermediate risk study because of there being evidence of a prior heart attack which was already seen.  The decision on what to do next will be based on how well he is doing when he seen in follow-up.  If he is continuing to have exertional chest pain having to go back to the Cath Lab, however if symptoms are better, I would suggest that is probably related to his recent illness.  I would like to check a 2D echocardiogram however to get a better assessment of the ejection fraction and wall motion in that area.  Glenetta Hew, MD

## 2017-09-22 ENCOUNTER — Ambulatory Visit (INDEPENDENT_AMBULATORY_CARE_PROVIDER_SITE_OTHER)
Admission: RE | Admit: 2017-09-22 | Discharge: 2017-09-22 | Disposition: A | Discharge status: Home / Self Care | Payer: Medicare Other | Source: Ambulatory Visit | Attending: Acute Care | Admitting: Acute Care

## 2017-09-22 ENCOUNTER — Encounter: Payer: Self-pay | Admitting: Acute Care

## 2017-09-22 ENCOUNTER — Ambulatory Visit (INDEPENDENT_AMBULATORY_CARE_PROVIDER_SITE_OTHER): Payer: Medicare Other | Admitting: Acute Care

## 2017-09-22 VITALS — BP 124/84 | HR 91 | Ht 72.0 in | Wt 203.6 lb

## 2017-09-22 DIAGNOSIS — I779 Disorder of arteries and arterioles, unspecified: Secondary | ICD-10-CM

## 2017-09-22 DIAGNOSIS — J9621 Acute and chronic respiratory failure with hypoxia: Secondary | ICD-10-CM | POA: Diagnosis not present

## 2017-09-22 DIAGNOSIS — J432 Centrilobular emphysema: Secondary | ICD-10-CM

## 2017-09-22 DIAGNOSIS — J189 Pneumonia, unspecified organism: Secondary | ICD-10-CM

## 2017-09-22 DIAGNOSIS — R0602 Shortness of breath: Secondary | ICD-10-CM | POA: Diagnosis not present

## 2017-09-22 DIAGNOSIS — F17210 Nicotine dependence, cigarettes, uncomplicated: Secondary | ICD-10-CM | POA: Diagnosis not present

## 2017-09-22 DIAGNOSIS — R05 Cough: Secondary | ICD-10-CM | POA: Diagnosis not present

## 2017-09-22 NOTE — Telephone Encounter (Signed)
Spoke to wife. Result given.  Verbalized understanding. She aware echo has been schedule

## 2017-09-22 NOTE — Assessment & Plan Note (Signed)
Continue using your oxygen at 2 L during the day and 2L at night. Saturation goals are 88-92%

## 2017-09-22 NOTE — Progress Notes (Signed)
amb dme

## 2017-09-22 NOTE — Assessment & Plan Note (Signed)
Continue using your Advair and Spiriva as you have been doing Rinse mouth after use.

## 2017-09-22 NOTE — Patient Instructions (Addendum)
It is nice to meet you today. We will place an order for a POC that is smaller than the one you have. Continue using your Advair and Spiriva as you have been doing Rinse mouth after use. Continue using your oxygen at 2 L during the day and 2L at night. Saturation goals are 88-92% Follow up with Psychiatrist regarding your psych medications. Please quit smoking. Do not smoke while wearing your oxygen Follow up with Dr. Halford Chessman in July 2019. Note your daily symptoms > remember "red flags" for COPD:  Increase in cough, increase in sputum production, increase in shortness of breath or activity tolerance. If you notice these symptoms, please call to be seen.   Please contact office for sooner follow up if symptoms do not improve or worsen or seek emergency care

## 2017-09-22 NOTE — Assessment & Plan Note (Addendum)
Resolved per CXR Completed ABX as prescribed Plan: We will place an order for a POC that is smaller than the one you have. Continue using your Advair and Spiriva as you have been doing Rinse mouth after use. Continue using your oxygen at 2 L during the day and 2L at night. Saturation goals are 88-92% Follow up with Psychiatrist regarding your psych medications. Please quit smoking. Do not smoke while wearing your oxygen Follow up with Dr. Halford Chessman in July 2019. Note your daily symptoms > remember "red flags" for COPD:  Increase in cough, increase in sputum production, increase in shortness of breath or activity tolerance. If you notice these symptoms, please call to be seen.   Please contact office for sooner follow up if symptoms do not improve or worsen or seek emergency care

## 2017-09-22 NOTE — Telephone Encounter (Signed)
Sent order for echo . Left message for patient to call back - in regards to informing him of the order request

## 2017-09-22 NOTE — Progress Notes (Signed)
History of Present Illness John Ball is a 77 y.o. male with COPD with emphysema and chronic bronchitis, Chronic respiratory failure with hypoxia, tobacco abuse,squamous cell lung cancer (stage  IA, s/p of right lobectomy, no further treatment needed),h/oCAD, S/P ofCABG, OSA not on CPAP, PVD, h/oDVT(currently not on anticoagulation), h/o PTSD,severe hearing loss bilaterally. He is followed by Dr. Halford Chessman.( Last OV 07/2017)   09/22/2017  Hospital Follow up: Pt. Presents for hospital follow up. He was admitted to the hospital 09/12/2017-09/13/2017 for for CAP. He was treated with ABX, IVF, Scheduled BD, and IV steroids. He has completed his discharge prednisone and antibiotics.He states he still doesn't feel well. Pt. Is out of sorts today. His wife states that ever since his Trazadone was decreased he has been difficult. She states it was decreased due to it making him so somnolent.  He is compliant with the albuterol nebs twice daily, and he is compliant with his Advair and Spiriva. He denies any worsening of secretions, fever, chest pain, or orthopnea. Pt. Smells very strongly of cigarette smoke. He admits he is continuing to smoke. Pt. Is requesting a smaller oxygen tank. He is a poor historian, his wife is providing most of these details.  Discharge Notes :  COPD exacerbation/CAP/chronic hypoxic respiratory failure on home o2 2liter 24/7 -he does has history of lung cancer -He presented with tachycardia, tachypnea and wheezing. -CTA chest 09/12/2017 1. No acute pulmonary embolism. 2. RIGHT greater than LEFT tree-in-bud infiltrates likely infectious, less likely inflammatory with RIGHT middle lobe atelectasis versus early pneumonia. 3. Status post RIGHT lower lobectomy. Increasing RIGHT hilar lymphadenopathy, potentially reactive though, recurrent neoplasm not excluded. Recommend close attention on follow-up imaging  - urine strep pneumo antigen negative. Flu swab negative. mrsa  screening negative.  -Sputum culture normal flora, spiratory viral panel negative, blood culture no growth -he received rocephin and zithro, steroids  in the hospital -Tachycardia has resolved ,wheezing resolved, No hypoxia on 2 L.  I have advised him to stay in hospital for 1 more day, especially culture results still pending and he remained tachypneic even at rest. he is adamant about going home.  He stated that he does not at home as well . his wife feels she can take care of the patient at home as well. -he is discharged on augmentin and steroids taper, close follow up with pmd and pulmonology. -He reports currently smoke half a pack a day,declined nicotine patch. He is not ready to quit smoking.   Test Results:  CXR 09/22/2017>> Follow up pneumonia No active cardiopulmonary disease. Patchy consolidation in the anterior aspect of the right middle lobe has resolved. Unchanged atelectasis/scarring in the posterior aspect of the right middle lobe. 2. Diffuse interstitial thickening is unchanged and may be smoking-related.      CBC Latest Ref Rng & Units 09/13/2017 09/12/2017 08/15/2016  WBC 4.0 - 10.5 K/uL 8.6 10.2 4.1  Hemoglobin 13.0 - 17.0 g/dL 12.1(L) 13.4 13.4  Hematocrit 39.0 - 52.0 % 37.0(L) 40.4 40.4  Platelets 150 - 400 K/uL 157 148(L) 123(L)    BMP Latest Ref Rng & Units 09/13/2017 09/12/2017 08/15/2016  Glucose 65 - 99 mg/dL 228(H) 119(H) 95  BUN 6 - 20 mg/dL 23(H) 25(H) 19  Creatinine 0.61 - 1.24 mg/dL 1.02 1.09 1.23  Sodium 135 - 145 mmol/L 140 141 139  Potassium 3.5 - 5.1 mmol/L 3.7 3.7 3.8  Chloride 101 - 111 mmol/L 104 103 104  CO2 22 - 32 mmol/L 24 27 26  Calcium 8.9 - 10.3 mg/dL 9.0 9.0 8.9    BNP    Component Value Date/Time   BNP 73.2 09/12/2017 1500    ProBNP No results found for: PROBNP  PFT    Component Value Date/Time   FEV1PRE 1.83 02/25/2014 1610   FVCPRE 3.08 02/25/2014 1610   DLCOUNC 12.29 02/25/2014 1610   PREFEV1FVCRT 59 02/25/2014 1610     Dg Chest 2 View  Result Date: 09/22/2017 CLINICAL DATA:  Cough, congestion, and shortness of breath. EXAM: CHEST - 2 VIEW COMPARISON:  Chest x-ray and CT chest dated September 12, 2017. FINDINGS: The heart size and mediastinal contours are within normal limits. Prior CABG. Normal pulmonary vascularity. Postsurgical changes related to prior right lower lobectomy. Prominent interstitial markings are similar to prior study. Subtle opacity within the posterior aspect of the right middle lobe is similar to prior study and likely reflects atelectasis or scarring. Patchy consolidation in the anterior aspect of the right middle lobe has resolved. No pleural effusion or pneumothorax. Unchanged elevation of the right hemidiaphragm. No acute osseous abnormality. IMPRESSION: 1. No active cardiopulmonary disease. Patchy consolidation in the anterior aspect of the right middle lobe has resolved. Unchanged atelectasis/scarring in the posterior aspect of the right middle lobe. 2. Diffuse interstitial thickening is unchanged and may be smoking-related. Electronically Signed   By: Titus Dubin M.D.   On: 09/22/2017 10:07   Dg Chest 2 View  Result Date: 09/12/2017 CLINICAL DATA:  Patient came In from cardiology office with wife. C/o of SHOB and cough, worsening over the past 2 days - hx COPD - hx hypertension - hx CHF - CABG x 4 - right lung cancer - current everyday smoker EXAM: CHEST - 2 VIEW COMPARISON:  08/15/2016 FINDINGS: normal cardiac silhouette. Chronic elevation of RIGHT hemidiaphragm. There is airspace opacity in the LEFT lower lobe. Patchy high diffuse pattern throughout the lungs which is slightly increased from prior. IMPRESSION: 1. Concern for LEFT lower lobe pneumonia. 2. More generalized patchy airspace disease may represent pulmonary edema. 3. Elevation of RIGHT hemidiaphragm. Electronically Signed   By: Suzy Bouchard M.D.   On: 09/12/2017 14:11   Ct Angio Chest Pe W Or Wo Contrast  Result Date:  09/12/2017 CLINICAL DATA:  Shortness of breath. History of lung cancer and RIGHT lower lobectomy 2012, CHF, hypertension, severe COPD. EXAM: CT ANGIOGRAPHY CHEST WITH CONTRAST TECHNIQUE: Multidetector CT imaging of the chest was performed using the standard protocol during bolus administration of intravenous contrast. Multiplanar CT image reconstructions and MIPs were obtained to evaluate the vascular anatomy. CONTRAST:  161mL ISOVUE-370 IOPAMIDOL (ISOVUE-370) INJECTION 76% COMPARISON:  Chest radiograph September 12, 2016 and CT chest May 02, 2014 FINDINGS: CARDIOVASCULAR: Adequate contrast opacification of the pulmonary artery's. Main pulmonary artery is not enlarged. No pulmonary arterial filling defects to the level of the segmental branches. Moderate respiratory motion limits sensitivity for subsegmental emboli. Heart size is normal. Severe coronary artery calcifications. Status post median sternotomy for CABG. No pericardial effusion. No pericardial effusion. Thoracic aorta is normal course and caliber, mild calcific atherosclerosis. MEDIASTINUM/NODES: Enlarging RIGHT perihilar low-density lymphadenopathy measuring 2.7 x 2.2 cm, previously 1.7 x 2 cm. Subcentimeter pretracheal, LEFT hilar lymph nodes. LUNGS/PLEURA: Tracheobronchial tree is patent, no pneumothorax. 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 Status post RIGHT lower lobectomy with surgical clips at the hilum. Diffuse tree-in-bud infiltrates throughout RIGHT lung with patchy consolidation RIGHT middle lobe. Tree-in-bud infiltrates dependent lingula and, within LEFT lower lobe without focal consolidation. No pleural effusion. UPPER  ABDOMEN: Mildly elevated RIGHT hemidiaphragm. Tiny layering gallbladder sludge versus stones without acute cholecystitis, incompletely assessed. MUSCULOSKELETAL: Progressive severe atrophy RIGHT deltoid muscle. Respiratory motion results in spurious appearance of sternal fracture. The Review of the MIP images  confirms the above findings. IMPRESSION: 1. No acute pulmonary embolism. 2. RIGHT greater than LEFT tree-in-bud infiltrates likely infectious, less likely inflammatory with RIGHT middle lobe atelectasis versus early pneumonia. 3. Status post RIGHT lower lobectomy. Increasing RIGHT hilar lymphadenopathy, potentially reactive though, recurrent neoplasm not excluded. Recommend close attention on follow-up imaging. Aortic Atherosclerosis (ICD10-I70.0) and Emphysema (ICD10-J43.9). Electronically Signed   By: Elon Alas M.D.   On: 09/12/2017 21:34     Past medical hx Past Medical History:  Diagnosis Date  . Anxiety   . Arthritis   . Complication of anesthesia    hx prolonged post op oxygen dependent/  hx hallucinations for 2 day post op lobectomy 2012  . COPD, severe ([place])   . Depression   . Essential hypertension   . Hearing loss of both ears    does wear his hearing aids  . History of arterial bypass of lower extremity    RIGHT FEM-POP  . History of CHF (congestive heart failure)   . History of DVT of lower extremity   . History of panic attacks   . Hyperlipidemia with target LDL less than 70   . Left main coronary artery disease 2004   a) Severe LM CAD 2004 -->s/p  cabg x4 (LIMA-LAD, free RIMA-RI, SVG-OM 2, SVG-RPDA); b) 06/2014: Abnormal Myoview --> c)cardiac Cath Jan 2016: pLM 70%, pLAD 40-50%- patent LIMA-LAD.  RI -competitive flow noted from RIMA-RI graft, circumflex, normal caliber with moderate OM1.  OM 2 occluded.  SVG-OM 2 patent. RCA CTO after large RV M, faint R-R and LAD septal-PDA collaterals =--> new  . Neuropathy, peripheral   . 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 Nocturia   . OSA (obstructive sleep apnea)    uses O2  via [place]  --  intolerate to CPAP  . Peripheral arterial occlusive disease ([place]) followed by dr Early--  per last dopplers 06-28-2014 no change right graft,  50-74% stenosis common and left mid superficial femoral artery   2011   a) [employer]  graft right AK popA-BK popA --> b) 2012: thrombosed graft --> thrombectomy with dacron patch = 1 V runnof via Peroneal; c) 01/2014: R femoropopliteal bypass with left femoral vein  . PTSD (post-traumatic stress disorder)    anxiety attack's---  [place] [employer]  . Recurrent productive cough    SMOKER'S COUGH  . 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  Medical management  . Smoker unmotivated to quit    Reportedly he came close to having a nervous breakdown when he last tried to quit  . Squamous cell lung cancer ([place]) 2012-2014   (oncologist [facility] -- dr Bretta Bang--  no recurrence , continued observation   Stage IA  Non-small cell--- a) 04/2011: R L Lobectomy & med node dissection (T1aN0M0) - [facility] [place], [place]) w/o post-op Rx; b) CT-A Chest Jan 2013: R pl effusion, R hilar LAN (3.3 cm x 2.8 cm & 2.2 cm x 1.3 cm); c) 2/'13 PET-CT Chest: Bilat Hilar LAN, no distant Mets  d) CT chest 9/'14: Stable shotty hilar nodes  bilaterally w/ no pathologic sized LAD or suspicious Pulm nodule;   . Wears glasses      Social History   Tobacco Use  . Smoking status: Current Every Day Smoker    Packs/day: 0.50    Years: 55.00    Pack years: 27.50    Types: Cigarettes  . Smokeless tobacco: Never Used  Substance Use Topics  . Alcohol use: No  . Drug use: No    Mr.Gilroy reports that he has been smoking cigarettes.  He has a 27.50 pack-year smoking history. he has never used smokeless tobacco. He reports that he does not drink alcohol or use drugs.  Tobacco Cessation: Current every day smoker  I have spent 4 minutes counseling patient on smoking cessation this visit. Patient has been counseled re: smoking while wearing his oxygen and the risk of flash burns or even death..  Past surgical hx, Family hx, Social hx all reviewed.  Current  Outpatient Medications on File Prior to Visit  Medication Sig  . ADVAIR DISKUS 500-50 MCG/DOSE AEPB   . albuterol (PROVENTIL HFA;VENTOLIN HFA) 108 (90 BASE) MCG/ACT inhaler Inhale 1 puff into the lungs every 6 (six) hours as needed for wheezing or shortness of breath.  Marland Kitchen albuterol (PROVENTIL) (5 MG/ML) 0.5% nebulizer solution Take 2.5 mg by nebulization every 6 (six) hours as needed for wheezing or shortness of breath.  Marland Kitchen amLODipine (NORVASC) 2.5 MG tablet Take 1 tablet (2.5 mg total) by mouth daily.  . Cholecalciferol (VITAMIN D3) 1000 UNITS CAPS Take 1 capsule by mouth every morning.  . clonazePAM (KLONOPIN) 1 MG tablet Take 1 mg by mouth 2 (two) times daily as needed for anxiety.  . DULoxetine (CYMBALTA) 30 MG capsule Take 30-60 mg by mouth 2 (two) times daily. 60mg  (2 capsules) every morning and 30mg  (1 capsule) daily at noon  . gabapentin (NEURONTIN) 100 MG capsule Take 100 mg by mouth at bedtime.   Marland Kitchen guaiFENesin (MUCINEX) 600 MG 12 hr tablet Take 1 tablet (600 mg total) by mouth 2 (two) times daily.  . nitroGLYCERIN (NITROSTAT) 0.4 MG SL tablet Place 1 tablet (0.4 mg total) under the tongue every 5 (five) minutes as needed for chest pain.  . OXYGEN Inhale into the lungs at bedtime. Uses O2 -- intolerant to CPAP  . prazosin (MINIPRESS) 1 MG capsule Take 1 mg by mouth at bedtime.  . predniSONE (DELTASONE) 10 MG tablet Label  & dispense according to the schedule below. 6 Pills PO on day one then, 5 Pills PO on day two, 4 Pills PO on day three, 3Pills PO on day four, 2 Pills PO on day five, 1 Pills PO on day six,  then STOP.  Total of 21 tabs  . QUEtiapine (SEROQUEL XR) 300 MG 24 hr tablet Take 300 mg by mouth at bedtime.   . ramipril (ALTACE) 10 MG capsule Take 5 mg by mouth every morning.   . tiotropium (SPIRIVA HANDIHALER) 18 MCG inhalation capsule Place 18 mcg into inhaler and inhale daily.  . traZODone (DESYREL) 150 MG tablet Take 0.5 tablets (75 mg total) by mouth at bedtime as needed for  sleep.  Marland Kitchen zolpidem (AMBIEN CR) 12.5 MG CR tablet Take 12.5 mg by mouth at bedtime.     No current facility-administered medications on file prior to visit.      Allergies  Allergen Reactions  . Aripiprazole Other (See Comments)    "makes him wild"  . Aspirin Other (See Comments)    Avoids -- bruises  real bad  . Crestor [Rosuvastatin Calcium] Other (See Comments)    myalgia  . Effexor [Venlafaxine Hydrochloride] Itching  . Lipitor [Atorvastatin Calcium] Other (See Comments)    myalgia  . Morphine And Related Itching  . Zetia [Ezetimibe] Other (See Comments)    myalgia  . Zocor [Simvastatin - High Dose] Other (See Comments)    myalgia    Review Of Systems:  Constitutional:   No  weight loss, night sweats,  Fevers, chills, fatigue, or  lassitude.  HEENT:   No headaches,  Difficulty swallowing,  Tooth/dental problems, or  Sore throat,                No sneezing, itching, ear ache, nasal congestion, post nasal drip,   CV:  No chest pain,  Orthopnea, PND, swelling in lower extremities, anasarca, dizziness, palpitations, syncope.   GI  No heartburn, indigestion, abdominal pain, nausea, vomiting, diarrhea, change in bowel habits, loss of appetite, bloody stools.   Resp: + shortness of breath with exertion or at rest.  No excess mucus, no productive cough,  No non-productive cough,  No coughing up of blood.  No change in color of mucus.  No wheezing.  No chest wall deformity  Skin: no rash or lesions.  GU: no dysuria, change in color of urine, no urgency or frequency.  No flank pain, no hematuria   MS:  No joint pain or swelling.  No decreased range of motion.  No back pain.  Psych:  No change in mood or affect. No depression or anxiety.  No memory loss.   Vital Signs BP 124/84 (BP Location: Right Arm, Cuff Size: Normal)   Pulse 91   Ht 6' (1.829 m)   Wt 203 lb 9.6 oz (92.4 kg)   SpO2 95%   BMI 27.61 kg/m    Physical Exam:  General- No distress,  A&Ox3, very angry  today ENT: No sinus tenderness, TM clear, pale nasal mucosa, no oral exudate,no post nasal drip, no LAN Cardiac: S1, S2, regular rate and rhythm, no murmur Chest: No wheeze/ rales/ dullness; no accessory muscle use, no nasal flaring, no sternal retractions, prolonged expiratory phase, diminished per bases Abd.: Soft Non-tender, non-distended, obese Ext: No clubbing cyanosis, 1+ BLE edema Neuro: Deconditioned at baseline, MAE x 3. Alert to person and self, and wife Skin: No rashes, warm and dry Psych: angry, agitated   Assessment/Plan  CAP (community acquired pneumonia) Resolved per CXR Completed ABX as prescribed Plan: We will place an order for a POC that is smaller than the one you have. Continue using your Advair and Spiriva as you have been doing Rinse mouth after use. Continue using your oxygen at 2 L during the day and 2L at night. Saturation goals are 88-92% Follow up with Psychiatrist regarding your psych medications. Please quit smoking. Do not smoke while wearing your oxygen Follow up with Dr. Halford Chessman in July 2019. Note your daily symptoms > remember "red flags" for COPD:  Increase in cough, increase in sputum production, increase in shortness of breath or activity tolerance. If you notice these symptoms, please call to be seen.   Please contact office for sooner follow up if symptoms do not improve or worsen or seek emergency care       Magdalen Spatz, NP 09/22/2017  4:54 PM

## 2017-09-25 ENCOUNTER — Ambulatory Visit (INDEPENDENT_AMBULATORY_CARE_PROVIDER_SITE_OTHER): Payer: Medicare Other | Admitting: Family Medicine

## 2017-09-25 ENCOUNTER — Encounter: Payer: Self-pay | Admitting: Family Medicine

## 2017-09-25 VITALS — BP 124/75 | HR 92 | Ht 72.0 in | Wt 203.7 lb

## 2017-09-25 DIAGNOSIS — E785 Hyperlipidemia, unspecified: Secondary | ICD-10-CM | POA: Diagnosis not present

## 2017-09-25 DIAGNOSIS — R5383 Other fatigue: Secondary | ICD-10-CM | POA: Diagnosis not present

## 2017-09-25 DIAGNOSIS — F332 Major depressive disorder, recurrent severe without psychotic features: Secondary | ICD-10-CM | POA: Diagnosis not present

## 2017-09-25 DIAGNOSIS — I208 Other forms of angina pectoris: Secondary | ICD-10-CM

## 2017-09-25 DIAGNOSIS — I2089 Other forms of angina pectoris: Secondary | ICD-10-CM

## 2017-09-25 DIAGNOSIS — R7302 Impaired glucose tolerance (oral): Secondary | ICD-10-CM | POA: Diagnosis not present

## 2017-09-25 DIAGNOSIS — C3491 Malignant neoplasm of unspecified part of right bronchus or lung: Secondary | ICD-10-CM | POA: Diagnosis not present

## 2017-09-25 DIAGNOSIS — E559 Vitamin D deficiency, unspecified: Secondary | ICD-10-CM | POA: Diagnosis not present

## 2017-09-25 DIAGNOSIS — F319 Bipolar disorder, unspecified: Secondary | ICD-10-CM | POA: Diagnosis not present

## 2017-09-25 DIAGNOSIS — I10 Essential (primary) hypertension: Secondary | ICD-10-CM

## 2017-09-25 DIAGNOSIS — R2689 Other abnormalities of gait and mobility: Secondary | ICD-10-CM | POA: Diagnosis not present

## 2017-09-25 DIAGNOSIS — J441 Chronic obstructive pulmonary disease with (acute) exacerbation: Secondary | ICD-10-CM | POA: Diagnosis not present

## 2017-09-25 DIAGNOSIS — I509 Heart failure, unspecified: Secondary | ICD-10-CM

## 2017-09-25 DIAGNOSIS — I779 Disorder of arteries and arterioles, unspecified: Secondary | ICD-10-CM

## 2017-09-25 NOTE — Patient Instructions (Signed)
Patient left the office AMA.  Did not want to stay, talk or be evaluated.  Patient's wife states that he is not a threat to her or himself at this time.    Patient's wife was advised to call his psychiatrist through the [place] that he sees for all his medications in order to be evaluated and have adjustments as needed.  However, about a week or 2 ago patient had an appointment with the [place] but refused to go per the wife.

## 2017-09-25 NOTE — Progress Notes (Signed)
Impression and Recommendations:    1. COPD exacerbation ([place])   2. Malignant neoplasm of right lung, unspecified part of lung ([place])   3. Severe episode of recurrent major depressive disorder, without psychotic features ([place])   4. Bipolar I disorder [facility])     Patient extremely agitated and uncooperative during appointment.  Patient left after a couple of minutes of talking to him.  Patient stated he did not want to be spoken to and did not want to be at the office visit today.  He told me to listen to his heart or do what I had to do so he could get out of there.  He was very angered by the fact he could not go smoke.   Patient denied  That he wanted to hurt himself or others.  When asked he did state that he often wondered what would happen if he took a whole bottle of his benzodiazepine.  When discussing this with his wife, she states he is always threatened this.  She has said several times during the day when they are alone at home for him to just take them if he wants to end his life.  They have been in the open and available to patient for many years and patient has never done anything to harm himself or others.  Wife states he would never hurt himself or her.  She states she is not inferior of her life or his  1. Hospital Follow-Up  - Last health management appointment here at the office was October of 2018.  Patient was told to follow up every 3-4 months due to his chronic conditions.   - Patient is here for hospital follow-up on community acquired pneumonia.  Recently followed up with [facility] and had echo and stress test by [facility].   - pt will follow with his psychiatrist at the [name]   No orders of the defined types were placed in this encounter.   No orders of the defined types were placed in this encounter.   Gross side effects, risk and benefits, and alternatives of medications and treatment plan in general discussed with patient.  Patient is aware that all  medications have potential side effects and we are unable to predict every side effect or drug-drug interaction that may occur.   Patient will call with any questions prior to using medication if they have concerns.  Expresses verbal understanding and consents to current therapy and treatment regimen.  No barriers to understanding were identified.  Red flag symptoms and signs discussed in detail.  Patient expressed understanding regarding what to do in case of emergency\urgent symptoms  Please see AVS handed out to patient at the end of our visit for further patient instructions/ counseling done pertaining to today's office visit.   No follow-ups on file.    Note: This note was prepared with assistance of Dragon voice recognition software. Occasional wrong-word or sound-a-like substitutions may have occurred due to the inherent limitations of voice recognition software.   This document serves as a record of services personally performed by John Dance, DO. It was created on her behalf by John Ball, a trained medical scribe. The creation of this record is based on the scribe's personal observations and the provider's statements to them.   I have reviewed the above medical documentation for accuracy and completeness and I concur.  John Ball 09/25/17 4:04 PM   ---------------------------------------------------------------------------------------------------------------------------------------------------------------    Subjective:  HPI: John Ball is a 77 y.o. male who presents to [place] at [facility] today for issues as discussed below.  Patient's last health management appointment was in October of 2018.  Patient was told to follow up every 3-4 months due to his chronic conditions.  Patient returns to the clinic today for a hospital follow-up from recent community acquired pneumonia.  Patient also followed up with [facility]  09/22/2017.  Had low blood counts at follow-up that needed to be checked today as outpatient.   Per wife, was supposed to go to psychiatry again recently, but refused to go.  Today patient is very agitated and uncooperative.  He denies the urge to hurt himself, but angrily confirms suicidal ideations.  Notes that this began two weeks ago at the onset of the flu.   Per wife, she believes he will not hurt himself.  She notes that he always acts up like this.  She notes that he drinks black coffee all day long.    History of Present Illness (Per Pulmonology 09/22/2017): John Ball is a 77 y.o. male with COPD with emphysema and chronic bronchitis, Chronic respiratory failure with hypoxia, tobacco abuse,squamous cell lung cancer (stage  IA, s/p of right lobectomy, no further treatment needed),h/oCAD, S/P ofCABG, OSA not on CPAP, PVD, h/oDVT(currently not on anticoagulation), h/o PTSD,severe hearing loss bilaterally. He is followed by Dr. Halford Chessman.( Last OV 07/2017)  09/22/2017  Hospital Follow up: Pt. Presents for hospital follow up. He was admitted to the hospital 09/12/2017-09/13/2017 for for CAP. He was treated with ABX, IVF, Scheduled BD, and IV steroids. He has completed his discharge prednisone and antibiotics.He states he still doesn't feel well. Pt. Is out of sorts today. His wife states that ever since his Trazadone was decreased he has been difficult. She states it was decreased due to it making him so somnolent.  He is compliant with the albuterol nebs twice daily, and he is compliant with his Advair and Spiriva. He denies any worsening of secretions, fever, chest pain, or orthopnea. Pt. Smells very strongly of cigarette smoke. He admits he is continuing to smoke. Pt. Is requesting a smaller oxygen tank. He is a poor historian, his wife is providing most of these details.  Discharge Notes :  COPD exacerbation/CAP/chronic hypoxic respiratory failure on home o2 2liter 24/7 -he does has  history of lung cancer -He presented with tachycardia,tachypnea and wheezing. -CTA chest3/02/2018 1. No acute pulmonary embolism. 2. RIGHT greater than LEFT tree-in-bud infiltrates likely infectious, less likely inflammatory with RIGHT middle lobe atelectasis versus early pneumonia. 3. Status post RIGHT lower lobectomy. Increasing RIGHT hilar lymphadenopathy, potentially reactive though, recurrent neoplasm not excluded. Recommend close attention on follow-up imaging  - urine strep pneumo antigennegative. Flu swab negative. mrsa screening negative.  -Sputum culturenormal flora,spiratory viral panelnegative, blood culture no growth -he received rocephin and zithro, steroids in the hospital -Tachycardia has resolved,wheezing resolved,No hypoxia on 2 L.I have advised him to stay in hospital for 1 more day,especially culture results still pendingandhe remained tachypneic even at rest.he is adamant about going home.He stated that he does not at home as well .his wifefeels she can take care of the patient athome as well. -he is discharged on augmentin and steroids taper, close follow up with pmd and pulmonology. -He reports currently smoke half a pack a day,declined nicotine patch.He is not ready to quit smoking.  Test Results:  CXR 09/22/2017>> Follow up pneumonia No active cardiopulmonary disease. Patchy consolidation  in the anterior aspect of the right middle lobe has resolved. Unchanged atelectasis/scarring in the posterior aspect of the right middle lobe. 2. Diffuse interstitial thickening is unchanged and may be smoking-related.   Assessment/Plan from Pulmonology 09/22/2017: CAP (community acquired pneumonia) Resolved per CXR Completed ABX as prescribed Plan: We will place an order for a POC that is smaller than the one you have. Continue using your Advair and Spiriva as you have been doing Rinse mouth after use. Continue using your oxygen at 2 L during the  day and 2L at night. Saturation goals are 88-92% Follow up with Psychiatrist regarding your psych medications. Please quit smoking. Do not smoke while wearing your oxygen Follow up with Dr. Halford Chessman in July 2019. Note your daily symptoms >remember "red flags" for COPD: Increase in cough, increase in sputum production, increase in shortness of breath or activity tolerance. If you notice these symptoms, please call to be seen.  Please contact office for sooner follow up if symptoms do not improve or worsen or seek emergency care   - Follow-Up - CBC, Kidney Function, and Sugars checked today. - Asked to follow up with PCP in a week, repeat CBC and BMP at follow-up.      Wt Readings from Last 3 Encounters:  09/25/17 203 lb 11.2 oz (92.4 kg)  09/22/17 203 lb 9.6 oz (92.4 kg)  09/12/17 210 lb (95.3 kg)   BP Readings from Last 3 Encounters:  09/25/17 124/75  09/22/17 124/84  09/13/17 132/68   Pulse Readings from Last 3 Encounters:  09/25/17 92  09/22/17 91  09/13/17 82   BMI Readings from Last 3 Encounters:  09/25/17 27.63 kg/m  09/22/17 27.61 kg/m  09/12/17 28.48 kg/m     Patient Care Team    Relationship Specialty Notifications Start End  John Dance, DO PCP - General Family Medicine  05/07/17      Patient Active Problem List   Diagnosis Date Noted  . CAP (community acquired pneumonia) 09/22/2017  . SOB (shortness of breath) 09/12/2017  . PTSD (post-traumatic stress disorder) 09/10/2017  . OSA (obstructive sleep apnea) 09/10/2017  . No natural teeth 09/10/2017  . Hearing loss of both ears 09/10/2017  . Atypical angina ([place]) 08/28/2017  . Radial artery occlusion, left ([place]) 08/28/2017  . Bipolar I disorder ([place]) 05/07/2017  . COPD exacerbation ([place]) 07/21/2016  . Acute on chronic respiratory failure with hypoxia ([place]) 07/21/2016  . CHF (congestive heart failure) ([place]) 07/21/2016  . Major depression, recurrent ([place]) 07/21/2016  . Abnormal stress test   .  Coronary artery disease involving coronary bypass graft of native heart with angina pectoris ([place])   . Chest pain at rest 06/06/2014  . Peripheral arterial occlusive disease ([place])   . Hyperlipidemia with target LDL less than 70   . Smoker unmotivated to quit   . Essential hypertension   . COPD (chronic obstructive pulmonary disease) ([place])   . Pain of left lower extremity 02/01/2014  . Lung cancer ([place]) 05/15/2011  . Squamous cell lung cancer ([place]) 05/05/2011  . Left main coronary artery disease 07/08/2002  . 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/2002    Past Medical history, Surgical history, Family history, Social history, Allergies and Medications have been entered into the medical record, reviewed and changed as needed.    Current Meds  Medication Sig  . ADVAIR DISKUS 500-50 MCG/DOSE AEPB   . albuterol (PROVENTIL HFA;VENTOLIN HFA) 108 (90 BASE) MCG/ACT inhaler Inhale 1 puff into the lungs every 6 (six) hours  as needed for wheezing or shortness of breath.  Marland Kitchen albuterol (PROVENTIL) (5 MG/ML) 0.5% nebulizer solution Take 2.5 mg by nebulization every 6 (six) hours as needed for wheezing or shortness of breath.  Marland Kitchen amLODipine (NORVASC) 2.5 MG tablet Take 1 tablet (2.5 mg total) by mouth daily.  . Cholecalciferol (VITAMIN D3) 1000 UNITS CAPS Take 1 capsule by mouth every morning.  . DULoxetine (CYMBALTA) 30 MG capsule Take 30-60 mg by mouth 2 (two) times daily. 60mg  (2 capsules) every morning and 30mg  (1 capsule) daily at noon  . gabapentin (NEURONTIN) 100 MG capsule Take 100 mg by mouth at bedtime.   Marland Kitchen guaiFENesin (MUCINEX) 600 MG 12 hr tablet Take 1 tablet (600 mg total) by mouth 2 (two) times daily.  . nitroGLYCERIN (NITROSTAT) 0.4 MG SL tablet Place 1 tablet (0.4 mg total) under the tongue every 5 (five) minutes as needed for chest pain.  . OXYGEN Inhale into the lungs at bedtime. Uses O2 -- intolerant to CPAP  . prazosin (MINIPRESS) 1 MG capsule Take 1 mg by mouth at bedtime.  . predniSONE (DELTASONE) 10  MG tablet Label  & dispense according to the schedule below. 6 Pills PO on day one then, 5 Pills PO on day two, 4 Pills PO on day three, 3Pills PO on day four, 2 Pills PO on day five, 1 Pills PO on day six,  then STOP.  Total of 21 tabs  . QUEtiapine (SEROQUEL XR) 300 MG 24 hr tablet Take 300 mg by mouth at bedtime.   . ramipril (ALTACE) 10 MG capsule Take 5 mg by mouth every morning.   . tiotropium (SPIRIVA HANDIHALER) 18 MCG inhalation capsule Place 18 mcg into inhaler and inhale daily.  . traZODone (DESYREL) 150 MG tablet Take 0.5 tablets (75 mg total) by mouth at bedtime as needed for sleep.  Marland Kitchen zolpidem (AMBIEN CR) 12.5 MG CR tablet Take 12.5 mg by mouth at bedtime.      Allergies:  Allergies  Allergen Reactions  . Aripiprazole Other (See Comments)    "makes him wild"  . Aspirin Other (See Comments)    Avoids -- bruises real bad  . Crestor [Rosuvastatin Calcium] Other (See Comments)    myalgia  . Effexor [Venlafaxine Hydrochloride] Itching  . Lipitor [Atorvastatin Calcium] Other (See Comments)    myalgia  . Morphine And Related Itching  . Zetia [Ezetimibe] Other (See Comments)    myalgia  . Zocor [Simvastatin - High Dose] Other (See Comments)    myalgia    Review of Systems:  A fourteen system review of systems was performed and found to be positive as per HPI.   Objective:   Blood pressure 124/75, pulse 92, height 6' (1.829 m), weight 203 lb 11.2 oz (92.4 kg). Body mass index is 27.63 kg/m. General:  Well Developed, well nourished, appropriate for stated age.  Neuro:  Alert and oriented,  extra-ocular muscles intact  HEENT:  Normocephalic, atraumatic, neck supple, no carotid bruits appreciated  Skin:  no gross rash, warm, pink. Cardiac:  RRR, S1 S2 Respiratory:  ECTA B/L and A/P, Not using accessory muscles, speaking in full sentences- unlabored. Vascular:  Ext warm, no cyanosis apprec.; cap RF less 2 sec. Psych:    No HI/SI, judgement and insight poor, agitated mood.  Affect- confrontational; labile and angered

## 2017-09-26 ENCOUNTER — Other Ambulatory Visit: Payer: Self-pay

## 2017-09-26 ENCOUNTER — Ambulatory Visit (HOSPITAL_COMMUNITY): Payer: Medicare Other | Attending: Cardiovascular Disease

## 2017-09-26 DIAGNOSIS — I2510 Atherosclerotic heart disease of native coronary artery without angina pectoris: Secondary | ICD-10-CM | POA: Diagnosis not present

## 2017-09-26 DIAGNOSIS — I110 Hypertensive heart disease with heart failure: Secondary | ICD-10-CM | POA: Insufficient documentation

## 2017-09-26 DIAGNOSIS — I509 Heart failure, unspecified: Secondary | ICD-10-CM | POA: Diagnosis not present

## 2017-09-26 DIAGNOSIS — Z85118 Personal history of other malignant neoplasm of bronchus and lung: Secondary | ICD-10-CM | POA: Insufficient documentation

## 2017-09-26 DIAGNOSIS — G4733 Obstructive sleep apnea (adult) (pediatric): Secondary | ICD-10-CM | POA: Diagnosis not present

## 2017-09-26 DIAGNOSIS — R9439 Abnormal result of other cardiovascular function study: Secondary | ICD-10-CM | POA: Insufficient documentation

## 2017-09-26 DIAGNOSIS — I208 Other forms of angina pectoris: Secondary | ICD-10-CM | POA: Diagnosis not present

## 2017-09-26 DIAGNOSIS — R0602 Shortness of breath: Secondary | ICD-10-CM

## 2017-09-26 DIAGNOSIS — J449 Chronic obstructive pulmonary disease, unspecified: Secondary | ICD-10-CM | POA: Diagnosis not present

## 2017-09-26 DIAGNOSIS — E785 Hyperlipidemia, unspecified: Secondary | ICD-10-CM | POA: Diagnosis not present

## 2017-09-26 DIAGNOSIS — Z902 Acquired absence of lung [part of]: Secondary | ICD-10-CM | POA: Diagnosis not present

## 2017-09-26 LAB — BASIC METABOLIC PANEL
BUN/Creatinine Ratio: 15 (ref 10–24)
BUN: 18 mg/dL (ref 8–27)
CALCIUM: 9.3 mg/dL (ref 8.6–10.2)
CO2: 27 mmol/L (ref 20–29)
CREATININE: 1.19 mg/dL (ref 0.76–1.27)
Chloride: 102 mmol/L (ref 96–106)
GFR calc Af Amer: 69 mL/min/{1.73_m2} (ref >59)
GFR, EST NON AFRICAN AMERICAN: 60 mL/min/{1.73_m2} (ref >59)
GLUCOSE: 110 mg/dL — AB (ref 65–99)
POTASSIUM: 5.1 mmol/L (ref 3.5–5.2)
Sodium: 143 mmol/L (ref 134–144)

## 2017-09-26 LAB — HEMOGLOBIN A1C
Est. average glucose Bld gHb Est-mCnc: 120 mg/dL
Hgb A1c MFr Bld: 5.8 % — ABNORMAL HIGH (ref 4.8–5.6)

## 2017-09-26 LAB — CBC
HEMATOCRIT: 43 % (ref 37.5–51.0)
HEMOGLOBIN: 14.6 g/dL (ref 13.0–17.7)
MCH: 33.2 pg — ABNORMAL HIGH (ref 26.6–33.0)
MCHC: 34 g/dL (ref 31.5–35.7)
MCV: 98 fL — ABNORMAL HIGH (ref 79–97)
Platelets: 228 10*3/uL (ref 150–379)
RBC: 4.4 x10E6/uL (ref 4.14–5.80)
RDW: 14.6 % (ref 12.3–15.4)
WBC: 7.8 10*3/uL (ref 3.4–10.8)

## 2017-09-26 LAB — LIPID PANEL
CHOL/HDL RATIO: 3.8 ratio (ref 0.0–5.0)
Cholesterol, Total: 170 mg/dL (ref 100–199)
HDL: 45 mg/dL (ref >39)
LDL Calculated: 97 mg/dL (ref 0–99)
TRIGLYCERIDES: 141 mg/dL (ref 0–149)
VLDL Cholesterol Cal: 28 mg/dL (ref 5–40)

## 2017-09-26 LAB — TSH: TSH: 1.84 u[IU]/mL (ref 0.450–4.500)

## 2017-09-26 LAB — VITAMIN B12: Vitamin B-12: 690 pg/mL (ref 232–1245)

## 2017-09-26 LAB — VITAMIN D 25 HYDROXY (VIT D DEFICIENCY, FRACTURES): VIT D 25 HYDROXY: 37.8 ng/mL (ref 30.0–100.0)

## 2017-10-06 ENCOUNTER — Encounter: Payer: Self-pay | Admitting: Cardiology

## 2017-10-06 ENCOUNTER — Ambulatory Visit (INDEPENDENT_AMBULATORY_CARE_PROVIDER_SITE_OTHER): Payer: Medicare Other | Admitting: Cardiology

## 2017-10-06 VITALS — BP 129/76 | HR 82 | Ht 72.0 in | Wt 208.8 lb

## 2017-10-06 DIAGNOSIS — I10 Essential (primary) hypertension: Secondary | ICD-10-CM | POA: Diagnosis not present

## 2017-10-06 DIAGNOSIS — I5032 Chronic diastolic (congestive) heart failure: Secondary | ICD-10-CM

## 2017-10-06 DIAGNOSIS — I25709 Atherosclerosis of coronary artery bypass graft(s), unspecified, with unspecified angina pectoris: Secondary | ICD-10-CM | POA: Diagnosis not present

## 2017-10-06 DIAGNOSIS — R079 Chest pain, unspecified: Secondary | ICD-10-CM

## 2017-10-06 DIAGNOSIS — J449 Chronic obstructive pulmonary disease, unspecified: Secondary | ICD-10-CM

## 2017-10-06 DIAGNOSIS — F17200 Nicotine dependence, unspecified, uncomplicated: Secondary | ICD-10-CM | POA: Diagnosis not present

## 2017-10-06 DIAGNOSIS — I779 Disorder of arteries and arterioles, unspecified: Secondary | ICD-10-CM

## 2017-10-06 DIAGNOSIS — E785 Hyperlipidemia, unspecified: Secondary | ICD-10-CM | POA: Diagnosis not present

## 2017-10-06 NOTE — Assessment & Plan Note (Signed)
On home oxygen and steroids/inhalers etc.  Managed by PCP.  I suspect this is probably the main etiology of his chest discomfort. Avoiding beta-blockers.

## 2017-10-06 NOTE — Assessment & Plan Note (Signed)
I suspect that some of his chest pain he was having when I last saw him was related to pneumonia and not CAD.  He still has some stable angina but notably improved with amlodipine. Avoiding beta-blocker because of COPD And fatigue.  If heart rate were to go up, I would switch from amlodipine to diltiazem, but currently with rates in the 80s and fine with amlodipine. Not on statin because of significant statin intolerance.  Need referral to [facility] for lipid management.

## 2017-10-06 NOTE — Assessment & Plan Note (Signed)
Chest pain probably musculoskeletal based on the fact that is improved as has his pulmonary issues.

## 2017-10-06 NOTE — Assessment & Plan Note (Signed)
Relatively euvolemic on exam.  No PND orthopnea symptoms and no edema.  No need for diuretic.  Continue amlodipine and ACE inhibitor.

## 2017-10-06 NOTE — Assessment & Plan Note (Signed)
When I asked him about stopping smoking and the timing of such, he indicated "when I die ".  Therefore he is not at all interested in smoking cessation and I will not for and address it.  He is well aware of the risks of smoking, but is likely beyond fix.

## 2017-10-06 NOTE — Assessment & Plan Note (Signed)
Statin intolerant as well as Zetia. Referred to [facility]. LDL is 97, needs to be less than 70 (preferably less than 50.  I suspect that he may be a good candidate for PCSK9 inhibitor.

## 2017-10-06 NOTE — Patient Instructions (Addendum)
Medication Instruction: No medication changes  Follow-Up: Your physician recommends that you schedule a follow-up appointment in: 1 month with [facility]/cholesterol   Your physician wants you to follow-up in: 6 months with Dr. Melody Haver will receive a reminder letter in the mail two months in advance. If you don't receive a letter, please call our office to schedule the follow-up appointment.   If you need a refill on your cardiac medications before your next appointment, please call your pharmacy.

## 2017-10-06 NOTE — Progress Notes (Signed)
PCP: John Dance, DO  Clinic Note: Chief Complaint  Patient presents with  . Follow-up    Chest pain/dyspnea evaluation with Myoview -results reviewed  . Coronary Artery Disease  . COPD    HPI: John Ball is a 77 y.o. male with a PMH below who presents today after last being seen in July 2016.  He has a history of CAD-CABG, hypertension, hyperlipidemia and long-standing smoking history with COPD. Prior to my seeing him in July 2016, he was seen by Dr. Gwenlyn Ball as well as Dr. Debara Ball. He is also followed by vascular surgery for PAD   He had a Myoview stress test in August 2011 that was negative for ischemia.  In November 2015 he had some chest discomfort and was evaluated with a nuclear stress test read as "intermediate risk ". ->    Cardiac catheterization January 2016 revealed occluded native RCA and occluded vein graft RCA.  No PCI.  Recommended medical management.  John Ball was last seen on February 21st 2019 Noted worsening dyspnea and chest pain.  Not interested in smoking cessation.  Was status post follow-up hospitalization for pneumonia and CHF.  Recent Hospitalizations:   ER 08/15/2016 ~ PNA & DHF : Admitted with several days of worsening shortness of chest pain.  Had significant wheezing.  Treated with nebs, prednisone and azithromycin with significant improvement.  Chest discomfort was thought to be musculoskeletal  John Ball to [facility] [place] 3/8 - 9 - PNA Rx w/ Abx.  Studies Personally Reviewed - (if available, images/films reviewed: From Epic Chart or Care Everywhere)  Myoview Stress Test (September 12, 2017): EF 42%.  Inferior, inferoseptal defect consistent with soft tissue attenuation versus scar.  No ischemia.  Read as intermediate risk because of low EF.  Echo ordered  Echo (September 26, 2017): EF 50-55%.  More accurate - Mild inferolateral HK (c/w known RCA occlusion).  GR 1 DD.  Mild LA dilation mildly increased PA pressures with mild RV/RA dilation.  Carotid and  upper extremity artery duplex (September 12, 2017): normal carotids (less than 40%).  Mild disruption in left subclavian artery flow, but mild left upper extremity arterial Dopplers no significant flow-limiting lesions.  Interval History: John Ball returns today stating that his chest discomfort and shortness of breath has stabilized.  He has only had to take an aspirin once since I last saw him (that is usually his first choice prior to using nitroglycerin).  His says his breathing is back to normal.  He still has dyspnea at his baseline that is worse with exertion but no real change.  No PND, or orthopnea with minimal right greater than left lower extremity edema..  His coughing is notably improved since his most recent stay in the hospital in March.  Ever since that cleared up, his shortness of breath is improved and the chest discomfort is also improved. Very rare episodes now of chest discomfort to the point where he thinks he may need to do something about it. No rapid irregular heartbeats or palpitations.  lightheadedness, dizziness or syncope/near syncope.. No melena, hematochezia, hematuria or epistaxis.  He does have claudication / episodic discomfort in his legs which is due to be evaluated by vascular surgery soon.   ROS: A comprehensive was performed. Review of Systems  Constitutional: Positive for malaise/fatigue. Negative for chills and fever.  HENT: Positive for hearing loss (Very hard of hearing). Negative for congestion and nosebleeds.   Respiratory: Positive for cough (Morning cough.  Notably cleared up since most  recent antibiotic course), shortness of breath and wheezing. Negative for sputum production (No longer as productive as it was in the ER).   Cardiovascular: Positive for chest pain.  Gastrointestinal: Negative for abdominal pain, blood in stool, constipation and melena.  Genitourinary: Negative for hematuria.  Musculoskeletal: Positive for joint pain (Knees and hips).    Neurological: Positive for tingling (He has some polyneuropathy). Negative for dizziness.  Endo/Heme/Allergies: Negative for environmental allergies.  Psychiatric/Behavioral: Positive for memory loss. Negative for depression. The patient is not nervous/anxious.   All other systems reviewed and are negative.  I have reviewed and (if needed) personally updated the patient's problem list, medications, allergies, past medical and surgical history, social and family history.   Past Medical History:  Diagnosis Date  . Anxiety   . Arthritis   . Complication of anesthesia    hx prolonged post op oxygen dependent/  hx hallucinations for 2 day post op lobectomy 2012  . COPD, severe ([place])   . Depression   . Essential hypertension   . Hearing loss of both ears    does wear his hearing aids  . History of arterial bypass of lower extremity    RIGHT FEM-POP  . History of CHF (congestive heart failure)   . History of DVT of lower extremity   . History of panic attacks   . Hyperlipidemia with target LDL less than 70   . Left main coronary artery disease 2004   a) Severe LM CAD 2004 -->s/p  cabg x4 (LIMA-LAD, free RIMA-RI, SVG-OM 2, SVG-RPDA); b) 06/2014: Abnormal Myoview --> c)cardiac Cath Jan 2016: pLM 70%, pLAD 40-50%- patent LIMA-LAD.  RI -competitive flow noted from RIMA-RI graft, circumflex, normal caliber with moderate OM1.  OM 2 occluded.  SVG-OM 2 patent. RCA CTO after large RV M, faint R-R and LAD septal-PDA collaterals =--> new  . Neuropathy, peripheral   . 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 Nocturia   . OSA (obstructive sleep apnea)    uses O2  via [place]  --  intolerate to CPAP  . Peripheral arterial occlusive disease ([place])    2011   a) Gore-Tex graft right AK popA-BK popA --> b) 2012: thrombosed graft --> thrombectomy with dacron patch = 1 V runnof via Peroneal; c) 01/2014: R femoropopliteal bypass with left femoral vein;; followed by dr Early--  per last dopplers 06-28-2014  no change right graft,  50-74% stenosis common and left mid superficial femoral artery  . PTSD (post-traumatic stress disorder)    anxiety attack's---  [place]  . Recurrent productive cough    SMOKER'S COUGH  . 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  Medical management  . Smoker unmotivated to quit    Reportedly he came close to having a nervous breakdown when he last tried to quit  . 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 Wears glasses     Past Surgical History:  Procedure Laterality Date  . CARDIAC CATHETERIZATION  07-13-2002   Ischemia RCA region on Cardiolite// 60-70% ostial left main, 50% midCx, 60-70% mid RI,  70% mRCA, 90% JeNu and crux 75% RCA ,  95% PLA//  severe LM and 3Vessel CAD, perserved LV, ef 60%  . CARDIOVASCULAR STRESS TEST  last one  06-17-2014   dr  Ellyn Hack   Carlton Adam study with no exercise; Intermediate Risk Study;   moderate size and intensity, partially reversible inferior septal defect consistent with prior infarct and mild to moderate peri-infarct ischemia;  mild hypokinesis of the inferior septal wall,  normal LV function, ef 56%  . COLONOSCOPY  2008    WNL  . CORONARY ARTERY BYPASS GRAFT  07/27/2002   LIMA-LAD, freeRIMA-RI,SVG-OM2, SVG-rPDA; Dr. Roxan Hockey  . CYSTOSCOPY W/ URETERAL STENT PLACEMENT N/A 02/21/2015   Procedure: BILATERAL RETROGRADE PYELOGRAM;  Surgeon: Festus Aloe, MD;  Location[facility]  [facility];  Service: Urology;  Laterality: N/A;  . EYE SURGERY Left Nov 2013  . FEMORAL ARTERY - POPLITEAL ARTERY BYPASS GRAFT Right 10-24-2008   dr early   w/ GoreTex graft  . FEMORAL-TIBIAL BYPASS GRAFT Right 01/10/2014   Procedure: Right Femoral to Posterior Tibial Bypass Graft using Left Leg Vein, Thrombectomy Right Common Femoral and Profunda of Leg . ;  Surgeon: Rosetta Posner, MD;  Location: [facility];  Service: Vascular;  Laterality: Right;  . HYDROCELE EXCISION Left   . LEFT ANKLE SURGERY  1990's  . LEFT HEART CATHETERIZATION WITH CORONARY/GRAFT ANGIOGRAM N/A 07/12/2014   Procedure: LEFT HEART CATHETERIZATION WITH Beatrix Fetters;  Surgeon: Troy Sine, MD;  Location[facility] C[facility] LAB;  Service: Cardiovascular;  Laterality: N/A;  pLM 70%, pLAD 40-50%- patent LIMA-LAD.  RI -competitive flow noted from fRIMA-RI graft, circumflex, normal caliber with moderate OM1.  OM 2 occluded.  SVG-OM 2 patent. RCA CTO after large RVM, faint R-R and LAD septal-PDA collateral  . NM MYOVIEW LTD  05/2014   Moderate sized, moderate intensity/partially reversible inferior defect consistent with prior infarct mild/moderate peri-infarct ischemia.  Inferoseptal HK.  INTERMEDIATE RISK. --> Cath showed CTO of Native RCA & SVG-rPDA.  Marland Kitchen RIGHT ABOVE KNEE POPLITEAL GRAFT TO BELOW KNEE POPLITEAL ARTERY BYPASS WITH REVERSE SAPHENOUS VEIN  03-02-2010  . THROMBECTOMY OF RIGHT FEMORAL TO ABOVE KNEE POPLITEAL GORETEX GRAFT ANGIOPLASTY OF GORETEX AND SAPHENOUS VEIN JUNCTION AND ABOVE KNEE POPLITEAL ARTERY   10-06-2010   DR EARLY  . TONSILLECTOMY    . TRANSURETHRAL RESECTION OF BLADDER TUMOR N/A 02/21/2015   Procedure: TRANSURETHRAL RESECTION OF BLADDER TUMOR (TURBT);  Surgeon: Festus Aloe, MD;  Location: [facility];  Service: Urology;  Laterality: N/A;  . VIDEO ASSISTED THORACOSCOPY (VATS)/WEDGE RESECTION Right 05-01-2011    FORSYTH   LOWER LOBECTOMY W/  NODE DISSECTION    Cardiac  Catheterization 07/12/2014: ? pLM 70%  LAD: Proximal 40-50; competitive filling from a patent LIMA-LAD graft  RI: Competitive flow from widely patent free RIMA-RI graft  Cx: Normal caliber, moderate OM1. OM 2 not visualized.  SVG-OM 2 widely patent ? RCA: 100% CTO after large RVM  -- Faint R-R and LAD septal-the PDA collaterals    SVG-RCA 100% CTO  LVEF 4550% with mid-basal inferior HK  Medical management as the identified lesion correlated with stress test   Current Meds  Medication Sig  . ADVAIR DISKUS 500-50 MCG/DOSE AEPB   . albuterol (PROVENTIL HFA;VENTOLIN HFA) 108 (90  BASE) MCG/ACT inhaler Inhale 1 puff into the lungs every 6 (six) hours as needed for wheezing or shortness of breath.  Marland Kitchen albuterol (PROVENTIL) (5 MG/ML) 0.5% nebulizer solution Take 2.5 mg by nebulization every 6 (six) hours as needed for wheezing or shortness of breath.  Marland Kitchen amLODipine (NORVASC) 2.5 MG tablet Take 1 tablet (2.5 mg total) by mouth daily.  . Cholecalciferol (VITAMIN D3) 1000 UNITS CAPS Take 1 capsule by mouth every morning.  . DULoxetine (CYMBALTA) 30 MG capsule Take 30-60 mg by mouth 2 (two) times daily. 60mg  (2 capsules) every morning and 30mg  (1 capsule) daily at noon  . gabapentin (NEURONTIN) 100 MG capsule Take 100 mg by mouth at bedtime.   Marland Kitchen guaiFENesin (MUCINEX) 600 MG 12 hr tablet Take 1 tablet (600 mg total) by mouth 2 (two) times daily.  . nitroGLYCERIN (NITROSTAT) 0.4 MG SL tablet Place 1 tablet (0.4 mg total) under the tongue every 5 (five) minutes as needed for chest pain.  . OXYGEN Inhale into the lungs at bedtime. Uses O2 -- intolerant to CPAP  . prazosin (MINIPRESS) 1 MG capsule Take 1 mg by mouth at bedtime.  . predniSONE (DELTASONE) 10 MG tablet Label  & dispense according to the schedule below. 6 Pills PO on day one then, 5 Pills PO on day two, 4 Pills PO on day three, 3Pills PO on day four, 2 Pills PO on day five, 1 Pills PO on day six,  then STOP.  Total of 21 tabs  .  QUEtiapine (SEROQUEL XR) 300 MG 24 hr tablet Take 300 mg by mouth at bedtime.   . ramipril (ALTACE) 10 MG capsule Take 5 mg by mouth every morning.   . tiotropium (SPIRIVA HANDIHALER) 18 MCG inhalation capsule Place 18 mcg into inhaler and inhale daily.  . traZODone (DESYREL) 150 MG tablet Take 0.5 tablets (75 mg total) by mouth at bedtime as needed for sleep.  Marland Kitchen zolpidem (AMBIEN CR) 12.5 MG CR tablet Take 12.5 mg by mouth at bedtime.      Allergies  Allergen Reactions  . Aripiprazole Other (See Comments)    "makes him wild"  . Aspirin Other (See Comments)    Avoids -- bruises real bad  . Crestor [Rosuvastatin Calcium] Other (See Comments)    myalgia  . Effexor [Venlafaxine Hydrochloride] Itching  . Lipitor [Atorvastatin Calcium] Other (See Comments)    myalgia  . Morphine And Related Itching  . Zetia [Ezetimibe] Other (See Comments)    myalgia  . Zocor [Simvastatin - High Dose] Other (See Comments)    myalgia    Social History   Tobacco Use  . Smoking status: Current Every Day Smoker    Packs/day: 0.50    Years: 55.00    Pack years: 27.50    Types: Cigarettes  . Smokeless tobacco: Never Used  Substance Use Topics  . Alcohol use: No  . Drug use: No   Social History   Social History Narrative   He is married with 2 children. Does not exercise regularly.   He and his wife have been recently living in [place], [place] (but just moved back to [place], [place] in November).   He still smokes about a half-pack a day and is not interested in quitting. No alcohol or recreational drug use.    family history includes Alzheimer's disease (age of onset: 65) in his father; Other (age of onset: 6) in his mother; Stroke in his father and mother.  Wt Readings from Last 3  Encounters:  10/06/17 208 lb 12.8 oz (94.7 kg)  09/25/17 203 lb 11.2 oz (92.4 kg)  09/22/17 203 lb 9.6 oz (92.4 kg)    PHYSICAL EXAM BP 129/76   Pulse 82   Ht 6' (1.829 m)   Wt 208 lb 12.8 oz (94.7 kg)    SpO2 98%   BMI 28.32 kg/m  Physical Exam  Constitutional: He is oriented to person, place, and time. He appears well-developed and well-nourished.  Appears older than stated age.  No acute distress.  Well-groomed.  Smells like cigarette smoke.  Baseline accessory muscle use for breathing.  HENT:  Head: Normocephalic and atraumatic.  Very poor teeth  Neck: Neck supple. No JVD present. Carotid bruit is not present. No tracheal deviation present.  Cardiovascular: Normal rate, regular rhythm, S1 normal, S2 normal and normal heart sounds.  Occasional extrasystoles are present. PMI is not displaced. Exam reveals decreased pulses. Exam reveals no gallop, no S4 and no midsystolic click.  No murmur heard. Significantly minimize pulses in the left wrist.  Barely palpable.   Pulmonary/Chest: Effort normal. No respiratory distress. He has wheezes. He has no rales (No rales, but definite diffuse rhonchi and interstitial sounds.  Diffusely abnormal lung exam.).  Coarse interstitial sounds with rhonchi but no obvious rales.  Abdominal: Soft. Bowel sounds are normal. He exhibits no distension. There is no tenderness. There is no rebound.  Musculoskeletal: Normal range of motion. He exhibits edema (Trivial).  Neurological: He is alert and oriented to person, place, and time. No cranial nerve deficit. Coordination normal.  Psychiatric: He has a normal mood and affect. His behavior is normal. Judgment and thought content normal.   He still remains stubborn but has relatively normal thought content if not poor judgment.  He is very stoic  Nursing note and vitals reviewed.   Adult ECG Report Not checked  Other studies Reviewed: Additional studies/ records that were reviewed today include:  Recent Labs: Reviewed Lab Results  Component Value Date   CREATININE 1.19 09/25/2017   BUN 18 09/25/2017   NA 143 09/25/2017   K 5.1 09/25/2017   CL 102 09/25/2017   CO2 27 09/25/2017   Lab Results  Component  Value Date   CHOL 170 09/25/2017   HDL 45 09/25/2017   LDLCALC 97 09/25/2017   TRIG 141 09/25/2017   CHOLHDL 3.8 09/25/2017     ASSESSMENT / PLAN: Problem List Items Addressed This Visit    Smoker unmotivated to quit (Chronic)    When I asked him about stopping smoking and the timing of such, he indicated "when I die ".  Therefore he is not at all interested in smoking cessation and I will not for and address it.  He is well aware of the risks of smoking, but is likely beyond fix.      Hyperlipidemia with target LDL less than 70 (Chronic)    Statin intolerant as well as Zetia. Referred to CV RR. LDL is 97, needs to be less than 70 (preferably less than 50.  I suspect that he may be a good candidate for PCSK9 inhibitor.      Essential hypertension (Chronic)    Blood pressure looks good today having added amlodipine.  You lisinopril plus current dose amlodipine with low threshold to titrate further.      Coronary artery disease involving coronary bypass graft of native heart with angina pectoris ([place]) - Primary (Chronic)    I suspect that some of his chest pain he was  having when I last saw him was related to pneumonia and not CAD.  He still has some stable angina but notably improved with amlodipine. Avoiding beta-blocker because of COPD And fatigue.  If heart rate were to go up, I would switch from amlodipine to diltiazem, but currently with rates in the 80s and fine with amlodipine. Not on statin because of significant statin intolerance.  Need referral to CV RR for lipid management.      COPD (chronic obstructive pulmonary disease) (HCC) (Chronic)    On home oxygen and steroids/inhalers etc.  Managed by PCP.  I suspect this is probably the main etiology of his chest discomfort. Avoiding beta-blockers.      Chronic diastolic heart failure (HCC) (Chronic)    Relatively euvolemic on exam.  No PND orthopnea symptoms and no edema.  No need for diuretic.  Continue amlodipine and ACE  inhibitor.      Chest pain at rest    Chest pain probably musculoskeletal based on the fact that is improved as has his pulmonary issues.         Current medicines are reviewed at length with the patient today. (+/- concerns) none The following changes have been made:See below  Patient Instructions  Medication Instruction: No medication changes  Follow-Up: Your physician recommends that you schedule a follow-up appointment in: 1 month with CVRR/cholesterol   Your physician wants you to follow-up in: 6 months with Dr. Melody Haver will receive a reminder letter in the mail two months in advance. If you don't receive a letter, please call our office to schedule the follow-up appointment.   If you need a refill on your cardiac medications before your next appointment, please call your pharmacy.   I suspect that he will require more than one follow-up with CVRR.   Studies Ordered:   No orders of the defined types were placed in this encounter.     Glenetta Hew, M.D., M.S. Interventional Cardiologist   Pager # [phone] Phone # [phone] [address] [place], [place] [address]   Thank you for choosing [facility]!!

## 2017-10-06 NOTE — Assessment & Plan Note (Signed)
Blood pressure looks good today having added amlodipine.  You lisinopril plus current dose amlodipine with low threshold to titrate further.

## 2017-11-11 ENCOUNTER — Ambulatory Visit (HOSPITAL_COMMUNITY)
Admission: RE | Admit: 2017-11-11 | Discharge: 2017-11-11 | Disposition: A | Discharge status: Home / Self Care | Payer: Medicare Other | Source: Ambulatory Visit | Attending: Family | Admitting: Family

## 2017-11-11 ENCOUNTER — Encounter: Payer: Self-pay | Admitting: Family

## 2017-11-11 ENCOUNTER — Other Ambulatory Visit: Payer: Self-pay | Admitting: *Deleted

## 2017-11-11 ENCOUNTER — Ambulatory Visit (INDEPENDENT_AMBULATORY_CARE_PROVIDER_SITE_OTHER): Payer: Medicare Other | Admitting: Family

## 2017-11-11 ENCOUNTER — Encounter: Payer: Self-pay | Admitting: *Deleted

## 2017-11-11 ENCOUNTER — Ambulatory Visit (INDEPENDENT_AMBULATORY_CARE_PROVIDER_SITE_OTHER)
Admission: RE | Admit: 2017-11-11 | Discharge: 2017-11-11 | Disposition: A | Discharge status: Home / Self Care | Payer: Medicare Other | Source: Ambulatory Visit | Attending: Family | Admitting: Family

## 2017-11-11 VITALS — BP 145/87 | HR 78 | Temp 97.0°F | Resp 22 | Ht 72.0 in | Wt 202.0 lb

## 2017-11-11 DIAGNOSIS — F17200 Nicotine dependence, unspecified, uncomplicated: Secondary | ICD-10-CM

## 2017-11-11 DIAGNOSIS — I739 Peripheral vascular disease, unspecified: Secondary | ICD-10-CM | POA: Diagnosis not present

## 2017-11-11 DIAGNOSIS — I779 Disorder of arteries and arterioles, unspecified: Secondary | ICD-10-CM

## 2017-11-11 DIAGNOSIS — I998 Other disorder of circulatory system: Secondary | ICD-10-CM

## 2017-11-11 DIAGNOSIS — Z95828 Presence of other vascular implants and grafts: Secondary | ICD-10-CM

## 2017-11-11 DIAGNOSIS — L97521 Non-pressure chronic ulcer of other part of left foot limited to breakdown of skin: Secondary | ICD-10-CM

## 2017-11-11 DIAGNOSIS — I10 Essential (primary) hypertension: Secondary | ICD-10-CM | POA: Insufficient documentation

## 2017-11-11 DIAGNOSIS — R0989 Other specified symptoms and signs involving the circulatory and respiratory systems: Secondary | ICD-10-CM | POA: Insufficient documentation

## 2017-11-11 NOTE — Progress Notes (Signed)
[facility] [place]   CC: Follow up peripheral artery occlusive disease  History of Present Illness John Ball is a 77 y.o. male who is status post extensive prior femoropopliteal bypasses on his right leg by Dr. Donnetta Hutching.   He initially had a right femoral to above-knee popliteal bypass in April 2010. This was followed by right above-knee to below-knee popliteal bypass in August of 2011. He presented with occlusion of this and underwent thrombectomy and revision of his femoral-popliteal bypass in April of 2012.  He is also s/p right common femoral and profundus femoris artery thrombectomy and endarterectomy; right femoral to posterior tibial bypass with saphenous vein harvested from left leg on 01/10/14. He has a history of DVT.  He returned on 07-15-17 with c/o 2-3 year hx of right foot cramping, right arm pain for about a year, left lower leg pain after walking about 75 feet, relieved by sitting, Pt states he informed his PCP about these sx's, states he was told "that's part of getting old".   Wife states pt has a lot of anxiety, "it don't take much for him to get in a tizzy", states he has PTSD.  She also states his cardiologist at the [facility] checked him recently and states "his heart was fine".   Pt was hospitalized on 09-12-17 with CAP.    His walking is also limited by his dyspnea, has severe COPD. He has a history of lung cancer with right lobectomy.  He denies any history of stroke or TIA, denies history of MI, but did have a CABG.  He had a bladder lesion--removed in the Summer of 2016, states not cancer  About 3 weeks ago he started having pain in his left great toe, mostly at night, relieved by Tylenol.  He states many years ago, while in [place], he walked on coral, developed an infection in his left foot; states his left foot has always been tender since then.  The redness in his left toes started about 2 weeks ago, is intermittent, and resolves with  elevation.  About a week ago, dry ulcer on medial aspect of first metatarsal head had some "pus" come from this ulcer.  He has been on supplemental O2 since 2012, when right lung cancer was diagnosed, and he had a right lobectomy. .     Diabetic: No Tobacco use smoker (1/2 ppd, started at age 77 yrs)  Pt meds include: Statin :No, statins caused itching ASA: No, record indicates that ASA caused GI bleed, but wife states this is not the case, that he bruised easily taking 81 mg ASA daily Other anticoagulants/antiplatelets: no     Past Medical History:  Diagnosis Date  . Anxiety   . Arthritis   . Complication of anesthesia    hx prolonged post op oxygen dependent/  hx hallucinations for 2 day post op lobectomy 2012  . COPD, severe ([place])   . Depression   . Essential hypertension   . Hearing loss of both ears    does wear his hearing aids  . History of arterial bypass of lower extremity    RIGHT FEM-POP  . History of CHF (congestive heart failure)   . History of DVT of lower extremity   . History of panic attacks   . Hyperlipidemia with target LDL less than 70   . Left main coronary artery disease 2004   a) Severe LM CAD 2004 -->s/p  cabg x4 (LIMA-LAD, free RIMA-RI, SVG-OM 2, SVG-RPDA); b)  06/2014: Abnormal Myoview --> c)cardiac Cath Jan 2016: pLM 70%, pLAD 40-50%- patent LIMA-LAD.  RI -competitive flow noted from RIMA-RI graft, circumflex, normal caliber with moderate OM1.  OM 2 occluded.  SVG-OM 2 patent. RCA CTO after large RV M, faint R-R and LAD septal-PDA collaterals =--> new  . Neuropathy, peripheral   . 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 Nocturia   . OSA (obstructive sleep apnea)    uses O2  via [place]  --  intolerate to CPAP  . Peripheral arterial occlusive disease ([place])    2011   a) Gore-Tex graft right AK popA-BK popA --> b) 2012: thrombosed graft --> thrombectomy with dacron patch = 1 V runnof via Peroneal; c) 01/2014: R femoropopliteal bypass with left  femoral vein;; followed by dr Early--  per last dopplers 06-28-2014 no change right graft,  50-74% stenosis common and left mid superficial femoral artery  . PTSD (post-traumatic stress disorder)    anxiety attack's---  [place] [employer]  . Recurrent productive cough    SMOKER'S COUGH  . 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  Medical management  . Smoker unmotivated to quit    Reportedly he came close to having a nervous breakdown when he last tried to quit  . 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 Wears glasses     Social History Social History   Tobacco Use  . Smoking status: Current Every Day Smoker    Packs/day: 0.50    Years: 55.00    Pack years: 27.50    Types: Cigarettes  . Smokeless tobacco: Never Used  Substance Use Topics  . Alcohol use: No  . Drug use: No    Family History Family History  Problem Relation Age of Onset  . Other Mother 62       Abdominal Aortic Aneurysm   . Stroke Mother   . Alzheimer's disease Father 25  . Stroke Father     Past Surgical History:  Procedure Laterality Date  . CARDIAC CATHETERIZATION  07-13-2002   Ischemia RCA region on Cardiolite// 60-70% ostial left main, 50% midCx, 60-70% mid RI,  70% mRCA, 90% JeNu and crux 75% RCA ,  95% PLA//  severe LM and 3Vessel CAD, perserved LV, ef 60%  . CARDIOVASCULAR STRESS TEST  last one  06-17-2014   dr  Ellyn Hack   [place] Adam study with no  exercise; Intermediate Risk Study;   moderate size and intensity, partially reversible inferior septal defect consistent with prior infarct and mild to moderate peri-infarct ischemia;  mild hypokinesis of the inferior septal wall,  normal LV function, ef 56%  . COLONOSCOPY  2008    WNL  . CORONARY ARTERY BYPASS GRAFT  07/27/2002   LIMA-LAD, freeRIMA-RI,SVG-OM2, SVG-rPDA; Dr. Roxan Hockey  . CYSTOSCOPY W/ URETERAL STENT PLACEMENT N/A 02/21/2015   Procedure: BILATERAL RETROGRADE  PYELOGRAM;  Surgeon: Festus Aloe, MD;  Location: [facility];  Service: Urology;  Laterality: N/A;  . EYE SURGERY Left Nov 2013  . FEMORAL ARTERY - POPLITEAL ARTERY BYPASS GRAFT Right 10-24-2008   dr early   w/ GoreTex graft  . FEMORAL-TIBIAL BYPASS GRAFT Right 01/10/2014   Procedure: Right Femoral to Posterior Tibial Bypass Graft using Left Leg Vein, Thrombectomy Right Common Femoral and Profunda of Leg . ;  Surgeon: Rosetta Posner, MD;  Location: [place];  Service: Vascular;  Laterality: Right;  . HYDROCELE EXCISION Left   . LEFT ANKLE SURGERY  1990's  . LEFT HEART CATHETERIZATION WITH CORONARY/GRAFT ANGIOGRAM N/A 07/12/2014   Procedure: LEFT HEART CATHETERIZATION WITH Beatrix Fetters;  Surgeon: Troy Sine, MD;  Location: [facility] C[facility] LAB;  Service: Cardiovascular;  Laterality: N/A;  pLM 70%, pLAD 40-50%- patent LIMA-LAD.  RI -competitive flow noted from fRIMA-RI graft, circumflex, normal caliber with moderate OM1.  OM 2 occluded.  SVG-OM 2 patent. RCA CTO after large RVM, faint R-R and LAD septal-PDA collateral  . NM MYOVIEW LTD  05/2014   Moderate sized, moderate intensity/partially reversible inferior defect consistent with prior infarct mild/moderate peri-infarct ischemia.  Inferoseptal HK.  INTERMEDIATE RISK. --> Cath showed CTO of Native RCA & SVG-rPDA.  Marland Kitchen RIGHT ABOVE KNEE POPLITEAL GRAFT TO BELOW KNEE POPLITEAL ARTERY BYPASS WITH REVERSE SAPHENOUS VEIN  03-02-2010  . THROMBECTOMY OF RIGHT FEMORAL TO  ABOVE KNEE POPLITEAL GORETEX GRAFT ANGIOPLASTY OF GORETEX AND SAPHENOUS VEIN JUNCTION AND ABOVE KNEE POPLITEAL ARTERY   10-06-2010   DR EARLY  . TONSILLECTOMY    . TRANSURETHRAL RESECTION OF BLADDER TUMOR N/A 02/21/2015   Procedure: TRANSURETHRAL RESECTION OF BLADDER TUMOR (TURBT);  Surgeon: Festus Aloe, MD;  Location: [facility];  Service: Urology;  Laterality: N/A;  . VIDEO ASSISTED THORACOSCOPY (VATS)/WEDGE RESECTION Right 05-01-2011    FORSYTH   LOWER LOBECTOMY W/  NODE DISSECTION    Allergies  Allergen Reactions  . Aripiprazole Other (See Comments)    "makes him wild"  . Aspirin Other (See Comments)    Avoids -- bruises real bad  . Crestor [Rosuvastatin Calcium] Other (See Comments)    myalgia  . Effexor [Venlafaxine Hydrochloride] Itching  . Lipitor [Atorvastatin Calcium] Other (See Comments)    myalgia  . Morphine And Related Itching  . Zetia [Ezetimibe] Other (See Comments)    myalgia  . Zocor [Simvastatin - High Dose] Other (See Comments)    myalgia    Current Outpatient Medications  Medication Sig Dispense Refill  . ADVAIR DISKUS 500-50 MCG/DOSE AEPB     . albuterol (PROVENTIL HFA;VENTOLIN HFA) 108 (90 BASE) MCG/ACT inhaler Inhale 1 puff into the lungs every 6 (six) hours as needed for wheezing or shortness of breath.    Marland Kitchen albuterol (PROVENTIL) (5 MG/ML) 0.5% nebulizer solution Take 2.5 mg by nebulization every 6 (six) hours as needed for wheezing or shortness of breath.    Marland Kitchen amLODipine (NORVASC) 2.5 MG tablet Take 1 tablet (2.5 mg total) by mouth daily. 30 tablet 4  . Cholecalciferol (VITAMIN D3) 1000 UNITS CAPS Take 1 capsule by mouth every morning.    . DULoxetine (CYMBALTA) 30 MG capsule Take 30-60 mg by mouth 2 (two) times daily. 60mg  (2 capsules) every morning and 30mg  (1 capsule) daily at noon    . gabapentin (NEURONTIN) 100 MG capsule Take 100 mg by mouth at bedtime.     Marland Kitchen guaiFENesin (MUCINEX) 600 MG 12 hr tablet Take 1 tablet (  600 mg total)  by mouth 2 (two) times daily. 30 tablet 0  . nitroGLYCERIN (NITROSTAT) 0.4 MG SL tablet Place 1 tablet (0.4 mg total) under the tongue every 5 (five) minutes as needed for chest pain. 25 tablet 6  . OXYGEN Inhale into the lungs at bedtime. Uses O2 -- intolerant to CPAP    . prazosin (MINIPRESS) 1 MG capsule Take 1 mg by mouth at bedtime.    . predniSONE (DELTASONE) 10 MG tablet Label  & dispense according to the schedule below. 6 Pills PO on day one then, 5 Pills PO on day two, 4 Pills PO on day three, 3Pills PO on day four, 2 Pills PO on day five, 1 Pills PO on day six,  then STOP.  Total of 21 tabs 21 tablet 0  . QUEtiapine (SEROQUEL XR) 300 MG 24 hr tablet Take 300 mg by mouth at bedtime.     . ramipril (ALTACE) 10 MG capsule Take 5 mg by mouth every morning.     . tiotropium (SPIRIVA HANDIHALER) 18 MCG inhalation capsule Place 18 mcg into inhaler and inhale daily.    . traZODone (DESYREL) 150 MG tablet Take 0.5 tablets (75 mg total) by mouth at bedtime as needed for sleep. 10 tablet 0  . zolpidem (AMBIEN CR) 12.5 MG CR tablet Take 12.5 mg by mouth at bedtime.       No current facility-administered medications for this visit.     ROS: See HPI for pertinent positives and negatives.   Physical Examination  Vitals:   11/11/17 1021 11/11/17 1023  BP: (!) 150/85 (!) 145/87  Pulse: 78   Resp: (!) 22   Temp: (!) 97 F (36.1 C)   TempSrc: Oral   SpO2: (!) 89%   Weight: 202 lb (91.6 kg)   Height: 6' (1.829 m)    Body mass index is 27.4 kg/m.  General: A&O x 3 male with odor of stale cigarette smoke  Gait: slow, steady HENT: no gross abnormalities  Eyes: PERRLA Pulmonary: limited air movement in right posterior fields, no rales, rhonchi, or wheezing. He has pursed lips expirations at rest, is dyspneic with walking. Cardiac: regular rhythm and rate, nodetected murmur.     Carotid Bruits Left Right   Negative Negative   Abdominal aortic pulse is not  palpable. Radial pulses: are: 1+ right, 2+ palpable left radial pulse, 2+ palpable right brachial pulse   VASCULAR EXAM: Extremitieswith ischemic changes: dependent rubor in all left toes, tips of right toes are slightly dusky, without Gangrene. With dry shallow ulcer at medial aspect of left great toe metatarsal head (see photo below), no drainage.  Left great toe and ulcer are tender to touch    Left foot, medial aspect            LE Pulses LEFT RIGHT   FEMORAL  not palpable 2+ palpable    POPLITEAL Not palpable  2+palpableat bypass, medial to knee   POSTERIOR TIBIAL Not palpable  1+ palpable    DORSALIS PEDIS  ANTERIOR TIBIAL not palpable    not palpable    Abdomen: soft, NT, no palpable masses. Skin: no rashes, see Extremities. Musculoskeletal: no muscle wasting or atrophy. Neurologic: A&O X 3; Appropriate Affect,; MOTOR FUNCTION: moving all extremities equally, motor strength 5/5 throughout. Speech is fluent/normal. CN 2-12 intact except he is hard of hearing. Psychiatric: Normal thought content other than agitated.      ASSESSMENT: COHL BEHRENS is a 77 y.o. male who  is status post extensive prior femoropopliteal bypasses on his right leg. He had initially had a right femoral to above-knee popliteal bypass in April 2010. This was followed by right above-knee to below-knee popliteal bypass in August of 2011. He presented with occlusion of this and underwent thrombectomy and revision of his femoral-popliteal bypass in April of 2012.  He is also s/p right common femoral and profundus femoris artery thrombectomy and endarterectomy; right femoral to posterior tibial bypass with saphenous vein harvested from left leg on 01/10/14.  His walking  is limited by dyspnea, apparently from severe COPD; also has a history of lung cancer with right lobectomy. Non palpable left femoral artery has remained present.  He now has rest pain in his left foot with a dry ulcer at the medial aspect of his first metatarsal head.   About 3 weeks ago he started having pain in his left great toe, mostly at night, relieved by Tylenol.  He states many years ago, while in [place], he walked on coral, developed an infection in his left foot; states his left foot has always been tender since then.  The redness in his left toes started about 2 weeks ago, is intermittent.  About a week ago, dry ulcer on medial aspect of first metatarsal head had some "pus" come from this ulcer.   Serum creatinine was 1.19 on 09-25-17.  Dr. Donnetta Hutching spoke with wife and pt and examined pt   DATA  Right LE Arterial Duplex (11/11/17): 323 cm/s PSV at the inflow to the bypass graft. Low PSV of 21 cm/s at the proximal graft All biphasic waveforms  Increased stenosis at the inflow compared to the exam on 03-13-17.    ABI (Date: 11/11/2017):  R:   ABI: 1.03 (was 1.15 on 03-13-17),   PT: bi  DP: bi  TBI:  0.40 (was 0.57)  L:   ABI: 0.52 (was 0.71),   PT: mono  DP: mono  TBI: 0 (was 0.26)  Right ABI remains normal with biphasic waveforms. Right TBI has declined. Decline in left ABI from 0.71 to 0.52, monophasic waveforms. Decline in left TBI, is now 0.    PLAN:  The patient was counseled re smoking cessation and given several free resources re smoking cessation.  Based on the patient's vascular studies and examination, pt will be scheduled for aortogram with run off, possible left LE intervention by Dr. Donnetta Hutching, ASAP.   I discussed in depth with the patient the nature of atherosclerosis, and emphasized the importance of maximal medical management including strict control of blood pressure, blood glucose, and lipid levels, obtaining regular exercise, and  cessation of smoking.  The patient is aware that without maximal medical management the underlying atherosclerotic disease process will progress, limiting the benefit of any interventions.  The patient was given information about PAD including signs, symptoms, treatment, what symptoms should prompt the patient to seek immediate medical care, and risk reduction measures to take.  Clemon Chambers, RN, MSN, FNP-C [facility] Phone: [phone]  Clinic MD: Early  11/11/17 10:29 AM

## 2017-11-11 NOTE — Patient Instructions (Signed)
Steps to Quit Smoking Smoking tobacco can be bad for your health. It can also affect almost every organ in your body. Smoking puts you and people around you at risk for many serious long-lasting (chronic) diseases. Quitting smoking is hard, but it is one of the best things that you can do for your health. It is never too late to quit. What are the benefits of quitting smoking? When you quit smoking, you lower your risk for getting serious diseases and conditions. They can include:  Lung cancer or lung disease.  Heart disease.  Stroke.  Heart attack.  Not being able to have children (infertility).  Weak bones (osteoporosis) and broken bones (fractures).  If you have coughing, wheezing, and shortness of breath, those symptoms may get better when you quit. You may also get sick less often. If you are pregnant, quitting smoking can help to lower your chances of having a baby of low birth weight. What can I do to help me quit smoking? Talk with your doctor about what can help you quit smoking. Some things you can do (strategies) include:  Quitting smoking totally, instead of slowly cutting back how much you smoke over a period of time.  Going to in-person counseling. You are more likely to quit if you go to many counseling sessions.  Using resources and support systems, such as: ? Online chats with a counselor. ? Phone quitlines. ? Printed self-help materials. ? Support groups or group counseling. ? Text messaging programs. ? Mobile phone apps or applications.  Taking medicines. Some of these medicines may have nicotine in them. If you are pregnant or breastfeeding, do not take any medicines to quit smoking unless your doctor says it is okay. Talk with your doctor about counseling or other things that can help you.  Talk with your doctor about using more than one strategy at the same time, such as taking medicines while you are also going to in-person counseling. This can help make  quitting easier. What things can I do to make it easier to quit? Quitting smoking might feel very hard at first, but there is a lot that you can do to make it easier. Take these steps:  Talk to your family and friends. Ask them to support and encourage you.  Call phone quitlines, reach out to support groups, or work with a counselor.  Ask people who smoke to not smoke around you.  Avoid places that make you want (trigger) to smoke, such as: ? Bars. ? Parties. ? Smoke-break areas at work.  Spend time with people who do not smoke.  Lower the stress in your life. Stress can make you want to smoke. Try these things to help your stress: ? Getting regular exercise. ? Deep-breathing exercises. ? Yoga. ? Meditating. ? Doing a body scan. To do this, close your eyes, focus on one area of your body at a time from head to toe, and notice which parts of your body are tense. Try to relax the muscles in those areas.  Download or buy apps on your mobile phone or tablet that can help you stick to your quit plan. There are many free apps, such as QuitGuide from the CDC (Centers for Disease Control and Prevention). You can find more support from smokefree.gov and other websites.  This information is not intended to replace advice given to you by your health care provider. Make sure you discuss any questions you have with your health care provider. Document Released: 04/20/2009 Document   Revised: 02/20/2016 Document Reviewed: 11/08/2014 Elsevier Interactive Patient Education  2018 [employer].     Peripheral Vascular Disease Peripheral vascular disease (PVD) is a disease of the blood vessels that are not part of your heart and brain. A simple term for PVD is poor circulation. In most cases, PVD narrows the blood vessels that carry blood from your heart to the rest of your body. This can result in a decreased supply of blood to your arms, legs, and internal organs, like your stomach or kidneys.  However, it most often affects a person's lower legs and feet. There are two types of PVD.  Organic PVD. This is the more common type. It is caused by damage to the structure of blood vessels.  Functional PVD. This is caused by conditions that make blood vessels contract and tighten (spasm).  Without treatment, PVD tends to get worse over time. PVD can also lead to acute ischemic limb. This is when an arm or limb suddenly has trouble getting enough blood. This is a medical emergency. Follow these instructions at home:  Take medicines only as told by your doctor.  Do not use any tobacco products, including cigarettes, chewing tobacco, or electronic cigarettes. If you need help quitting, ask your doctor.  Lose weight if you are overweight, and maintain a healthy weight as told by your doctor.  Eat a diet that is low in fat and cholesterol. If you need help, ask your doctor.  Exercise regularly. Ask your doctor for some good activities for you.  Take good care of your feet. ? Wear comfortable shoes that fit well. ? Check your feet often for any cuts or sores. Contact a doctor if:  You have cramps in your legs while walking.  You have leg pain when you are at rest.  You have coldness in a leg or foot.  Your skin changes.  You are unable to get or have an erection (erectile dysfunction).  You have cuts or sores on your feet that are not healing. Get help right away if:  Your arm or leg turns cold and blue.  Your arms or legs become red, warm, swollen, painful, or numb.  You have chest pain or trouble breathing.  You suddenly have weakness in your face, arm, or leg.  You become very confused or you cannot speak.  You suddenly have a very bad headache.  You suddenly cannot see. This information is not intended to replace advice given to you by your health care provider. Make sure you discuss any questions you have with your health care provider. Document Released:  09/18/2009 Document Revised: 11/30/2015 Document Reviewed: 12/02/2013 Elsevier Interactive Patient Education  2017 [employer] Inc.  

## 2017-11-12 ENCOUNTER — Other Ambulatory Visit: Payer: Self-pay | Admitting: *Deleted

## 2017-11-12 ENCOUNTER — Ambulatory Visit (HOSPITAL_COMMUNITY): Payer: Medicare Other

## 2017-11-12 ENCOUNTER — Telehealth: Payer: Self-pay | Admitting: *Deleted

## 2017-11-12 ENCOUNTER — Encounter (HOSPITAL_COMMUNITY): Payer: Self-pay | Admitting: *Deleted

## 2017-11-12 ENCOUNTER — Other Ambulatory Visit: Payer: Self-pay

## 2017-11-12 ENCOUNTER — Encounter (HOSPITAL_COMMUNITY)
Admission: RE | Disposition: A | Discharge status: Home / Self Care | Payer: Self-pay | Source: Ambulatory Visit | Attending: Vascular Surgery

## 2017-11-12 ENCOUNTER — Ambulatory Visit (HOSPITAL_COMMUNITY)
Admission: RE | Admit: 2017-11-12 | Discharge: 2017-11-12 | Disposition: A | Discharge status: Home / Self Care | Payer: Medicare Other | Source: Ambulatory Visit | Attending: Vascular Surgery | Admitting: Vascular Surgery

## 2017-11-12 DIAGNOSIS — Z902 Acquired absence of lung [part of]: Secondary | ICD-10-CM | POA: Insufficient documentation

## 2017-11-12 DIAGNOSIS — Z86718 Personal history of other venous thrombosis and embolism: Secondary | ICD-10-CM | POA: Diagnosis not present

## 2017-11-12 DIAGNOSIS — E785 Hyperlipidemia, unspecified: Secondary | ICD-10-CM | POA: Diagnosis not present

## 2017-11-12 DIAGNOSIS — F4310 Post-traumatic stress disorder, unspecified: Secondary | ICD-10-CM | POA: Diagnosis not present

## 2017-11-12 DIAGNOSIS — G4733 Obstructive sleep apnea (adult) (pediatric): Secondary | ICD-10-CM | POA: Insufficient documentation

## 2017-11-12 DIAGNOSIS — I2582 Chronic total occlusion of coronary artery: Secondary | ICD-10-CM | POA: Diagnosis not present

## 2017-11-12 DIAGNOSIS — J449 Chronic obstructive pulmonary disease, unspecified: Secondary | ICD-10-CM | POA: Insufficient documentation

## 2017-11-12 DIAGNOSIS — I2510 Atherosclerotic heart disease of native coronary artery without angina pectoris: Secondary | ICD-10-CM | POA: Diagnosis not present

## 2017-11-12 DIAGNOSIS — F329 Major depressive disorder, single episode, unspecified: Secondary | ICD-10-CM | POA: Diagnosis not present

## 2017-11-12 DIAGNOSIS — I509 Heart failure, unspecified: Secondary | ICD-10-CM | POA: Diagnosis not present

## 2017-11-12 DIAGNOSIS — I70222 Atherosclerosis of native arteries of extremities with rest pain, left leg: Secondary | ICD-10-CM | POA: Diagnosis not present

## 2017-11-12 DIAGNOSIS — I110 Hypertensive heart disease with heart failure: Secondary | ICD-10-CM | POA: Insufficient documentation

## 2017-11-12 DIAGNOSIS — I739 Peripheral vascular disease, unspecified: Secondary | ICD-10-CM | POA: Diagnosis present

## 2017-11-12 DIAGNOSIS — Z9981 Dependence on supplemental oxygen: Secondary | ICD-10-CM | POA: Diagnosis not present

## 2017-11-12 DIAGNOSIS — Z951 Presence of aortocoronary bypass graft: Secondary | ICD-10-CM | POA: Insufficient documentation

## 2017-11-12 DIAGNOSIS — G629 Polyneuropathy, unspecified: Secondary | ICD-10-CM | POA: Diagnosis not present

## 2017-11-12 HISTORY — PX: LOWER EXTREMITY ANGIOGRAPHY: CATH118251

## 2017-11-12 HISTORY — PX: ABDOMINAL AORTOGRAM: CATH118222

## 2017-11-12 LAB — POCT I-STAT, CHEM 8
BUN: 20 mg/dL (ref 6–20)
CALCIUM ION: 1.2 mmol/L (ref 1.15–1.40)
Chloride: 103 mmol/L (ref 101–111)
Creatinine, Ser: 0.9 mg/dL (ref 0.61–1.24)
GLUCOSE: 103 mg/dL — AB (ref 65–99)
HCT: 46 % (ref 39.0–52.0)
HEMOGLOBIN: 15.6 g/dL (ref 13.0–17.0)
Potassium: 4.3 mmol/L (ref 3.5–5.1)
Sodium: 142 mmol/L (ref 135–145)
TCO2: 30 mmol/L (ref 22–32)

## 2017-11-12 LAB — CBC
HCT: 41.5 % (ref 39.0–52.0)
HEMOGLOBIN: 13.3 g/dL (ref 13.0–17.0)
MCH: 32.9 pg (ref 26.0–34.0)
MCHC: 32 g/dL (ref 30.0–36.0)
MCV: 102.7 fL — ABNORMAL HIGH (ref 78.0–100.0)
PLATELETS: 155 10*3/uL (ref 150–400)
RBC: 4.04 MIL/uL — ABNORMAL LOW (ref 4.22–5.81)
RDW: 13.8 % (ref 11.5–15.5)
WBC: 5.9 10*3/uL (ref 4.0–10.5)

## 2017-11-12 LAB — URINALYSIS, ROUTINE W REFLEX MICROSCOPIC
BILIRUBIN URINE: NEGATIVE
Bacteria, UA: NONE SEEN
Glucose, UA: NEGATIVE mg/dL
HGB URINE DIPSTICK: NEGATIVE
KETONES UR: NEGATIVE mg/dL
LEUKOCYTES UA: NEGATIVE
NITRITE: NEGATIVE
PROTEIN: NEGATIVE mg/dL
Specific Gravity, Urine: >1.046 — ABNORMAL HIGH (ref 1.005–1.030)
pH: 7 (ref 5.0–8.0)

## 2017-11-12 LAB — COMPREHENSIVE METABOLIC PANEL
ALBUMIN: 3.7 g/dL (ref 3.5–5.0)
ALK PHOS: 50 U/L (ref 38–126)
ALT: 14 U/L — ABNORMAL LOW (ref 17–63)
ANION GAP: 7 (ref 5–15)
AST: 16 U/L (ref 15–41)
BUN: 14 mg/dL (ref 6–20)
CALCIUM: 8.9 mg/dL (ref 8.9–10.3)
CO2: 29 mmol/L (ref 22–32)
Chloride: 104 mmol/L (ref 101–111)
Creatinine, Ser: 0.9 mg/dL (ref 0.61–1.24)
GFR calc non Af Amer: >60 mL/min (ref >60)
GLUCOSE: 165 mg/dL — AB (ref 65–99)
Potassium: 4.2 mmol/L (ref 3.5–5.1)
SODIUM: 140 mmol/L (ref 135–145)
Total Bilirubin: 0.6 mg/dL (ref 0.3–1.2)
Total Protein: 6.1 g/dL — ABNORMAL LOW (ref 6.5–8.1)

## 2017-11-12 LAB — APTT: APTT: 31 s (ref 24–36)

## 2017-11-12 LAB — TYPE AND SCREEN
ABO/RH(D): A POS
Antibody Screen: NEGATIVE

## 2017-11-12 LAB — SURGICAL PCR SCREEN
MRSA, PCR: POSITIVE — AB
STAPHYLOCOCCUS AUREUS: POSITIVE — AB

## 2017-11-12 LAB — PROTIME-INR
INR: 1.05
Prothrombin Time: 13.6 seconds (ref 11.4–15.2)

## 2017-11-12 SURGERY — LOWER EXTREMITY ANGIOGRAPHY
Anesthesia: LOCAL

## 2017-11-12 MED ORDER — ACETAMINOPHEN 325 MG PO TABS: 650.0000 mg | ORAL_TABLET | ORAL | Status: DC | PRN

## 2017-11-12 MED ORDER — ONDANSETRON HCL 4 MG/2ML IJ SOLN: 4.0000 mg | Freq: Four times a day (QID) | INTRAMUSCULAR | Status: DC | PRN

## 2017-11-12 MED ORDER — FENTANYL CITRATE (PF) 100 MCG/2ML IJ SOLN
INTRAMUSCULAR | Status: AC
  Filled 2017-11-12: qty 2

## 2017-11-12 MED ORDER — HEPARIN (PORCINE) IN NACL 2-0.9 UNITS/ML
INTRAMUSCULAR | Status: AC | PRN
  Administered 2017-11-12 (×2): 500 mL

## 2017-11-12 MED ORDER — MIDAZOLAM HCL 2 MG/2ML IJ SOLN
INTRAMUSCULAR | Status: DC | PRN
  Administered 2017-11-12: 0.5 mg via INTRAVENOUS

## 2017-11-12 MED ORDER — MIDAZOLAM HCL 2 MG/2ML IJ SOLN
INTRAMUSCULAR | Status: AC
  Filled 2017-11-12: qty 2

## 2017-11-12 MED ORDER — FENTANYL CITRATE (PF) 100 MCG/2ML IJ SOLN
INTRAMUSCULAR | Status: DC | PRN
  Administered 2017-11-12: 25 ug via INTRAVENOUS

## 2017-11-12 MED ORDER — SODIUM CHLORIDE 0.9 % IV SOLN
INTRAVENOUS | Status: DC
  Administered 2017-11-12: 08:00:00 via INTRAVENOUS

## 2017-11-12 MED ORDER — HEPARIN (PORCINE) IN NACL 1000-0.9 UT/500ML-% IV SOLN
INTRAVENOUS | Status: AC
  Filled 2017-11-12: qty 1000

## 2017-11-12 MED ORDER — SODIUM CHLORIDE 0.9 % WEIGHT BASED INFUSION: 1.0000 mL/kg/h | INTRAVENOUS | Status: DC

## 2017-11-12 MED ORDER — LIDOCAINE HCL (PF) 1 % IJ SOLN
INTRAMUSCULAR | Status: AC
  Filled 2017-11-12: qty 30

## 2017-11-12 MED ORDER — SODIUM CHLORIDE 0.9 % IV SOLN: 250.0000 mL | INTRAVENOUS | Status: DC | PRN

## 2017-11-12 MED ORDER — SODIUM CHLORIDE 0.9% FLUSH: 3.0000 mL | Freq: Two times a day (BID) | INTRAVENOUS | Status: DC

## 2017-11-12 MED ORDER — SODIUM CHLORIDE 0.9% FLUSH: 3.0000 mL | INTRAVENOUS | Status: DC | PRN

## 2017-11-12 MED ORDER — IODIXANOL 320 MG/ML IV SOLN
INTRAVENOUS | Status: DC | PRN
  Administered 2017-11-12: 97 mL via INTRA_ARTERIAL

## 2017-11-12 MED ORDER — LABETALOL HCL 5 MG/ML IV SOLN: 10.0000 mg | INTRAVENOUS | Status: DC | PRN

## 2017-11-12 MED ORDER — HYDRALAZINE HCL 20 MG/ML IJ SOLN: 5.0000 mg | INTRAMUSCULAR | Status: DC | PRN

## 2017-11-12 MED ORDER — LIDOCAINE HCL (PF) 1 % IJ SOLN
INTRAMUSCULAR | Status: DC | PRN
  Administered 2017-11-12: 20 mL

## 2017-11-12 SURGICAL SUPPLY — 12 items
CATH OMNI FLUSH 5F 65CM (CATHETERS) ×3 IMPLANT
COVER PRB 48X5XTLSCP FOLD TPE (BAG) ×2 IMPLANT
COVER PROBE 5X48 (BAG) ×3
GLIDEWIRE ADV .035X180CM (WIRE) ×3 IMPLANT
KIT MICROPUNCTURE NIT STIFF (SHEATH) ×3 IMPLANT
KIT PV (KITS) ×3 IMPLANT
SHEATH AVANTI 11CM 6FR (SHEATH) ×3 IMPLANT
SHEATH PINNACLE 5F 10CM (SHEATH) ×3 IMPLANT
SYR MEDRAD MARK V 150ML (SYRINGE) ×3 IMPLANT
TRANSDUCER W/STOPCOCK (MISCELLANEOUS) ×3 IMPLANT
TRAY PV CATH (CUSTOM PROCEDURE TRAY) ×3 IMPLANT
WIRE BENTSON .035X145CM (WIRE) ×3 IMPLANT

## 2017-11-12 NOTE — Progress Notes (Signed)
Patient PCR + for MRSA and Staph.  Called patient and left message.  Spoke with Gomez Cleverly, RN at Dr. Luther Parody office.  She stated she will let Dr. Donnetta Hutching know PCR was +.  Called in prescription to [place] on [facility] at  [phone], left message on pharmacy voicemail with callback number for any questions.

## 2017-11-12 NOTE — H&P (Signed)
   History and Physical Update  The patient was interviewed and re-examined.  The patient's previous History and Physical has been reviewed and is unchanged from recent office visit. Plan for aortogram with ble runoff and possible intervention on left.   Brandon C. Donzetta Matters, MD [facility] [place] Office: [phone] Pager: [phone]   11/12/2017, 8:23 AM

## 2017-11-12 NOTE — Discharge Instructions (Signed)

## 2017-11-12 NOTE — Op Note (Signed)
    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 Donzetta Matters, MD Procedure Performed: 1.  Ultrasound-guided cannulation right common femoral artery 2.  Aortogram with bilateral lower extremity runoff 3.  Fentanyl and Versed moderate sedation for 28 minutes  Indications: 77 year old male with history of right femoral to popliteal artery bypass now with rest pain of his left lower extremity.  He is indicated for angiogram possible intervention of his left lower extremity.  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  The right common femoral artery has a patch on it and there is significant scar tissue there does appear to be flow limitation with catheter in place of the external iliac artery on the right.  The right side has a bypass that goes to the popliteal artery with dominant flow via the posterior tibial.  On the left side there is external iliac artery disease as well as a subtotal occlusion of the common femoral artery and the profunda femoris.  Superficial femoral artery is patent proximally occludes in the mid thigh reconstitutes a heavily calcified above-knee popliteal.  Below the knee popliteal does appear to be free of calcification but the tibioperoneal trunk is possibly disease.  The anterior tibial artery is occluded.  The dominant runoff to the foot is via the posterior tibial artery which appears free of disease.  He will need consideration of femoral endarterectomy with possible femoral to below-knee popliteal artery bypass.   Procedure:  The patient was identified in the holding area and taken to room 8.  The patient was then placed supine on the table and prepped and draped in the usual sterile fashion.  A time out was called.  Ultrasound was used to evaluate the right common femoral artery.  It was confirmedpatent .  A digital  ultrasound image was acquired and saved to the permanent recored.  A micropuncture needle was used to access the right common femoral artery under ultrasound guidance direct visualization.  An 018 wire was advanced without resistance and a micropuncture sheath was placed.  The 018 wire was removed and a benson wire was placed.  Unfortunately we could not get a sheath to go and so we had to exchange back for a dilator that was 5 [place].  I then used a Glidewire advantage to get across stenoses in the external iliac artery and get the stiff part of the wire into the common femoral.  I then dilated with a 6 [place] dilator placed a 5 [place] sheath.  I was unable to get a Omni Flush catheter into the aorta and performed aortogram.  This was pulled down and then bilateral lower extremity runoff was obtained with the findings noted above.  He does not have any good endovascular options and so will discuss possible open revascularization of his left lower extremity.  Sheath will be pulled in postoperative holding.  He tolerated this procedure well without immediate complication.   Contrast: 97cc  Agape Hardiman C. Donzetta Matters, MD [facility] [place] Office: [phone] Pager: [phone]

## 2017-11-12 NOTE — Progress Notes (Signed)
Call to pre-admit that patient is in the cath lab now in attempt to get pre-op work up today. Spoke with TARA.

## 2017-11-12 NOTE — Progress Notes (Signed)
PCP -  Cardiologist -   Chest x-ray - 09/22/17 EKG - 09/22/17 Stress Test - 2019 said with doctor harding - not no records noted and they cant remember where exactly It was completed  ECHO - 2019 Cardiac Cath - 2019, 2016  Sleep Study - > 10 years CPAP - no cpap patient just wear oxygen at night 2L  Anesthesia review: yes, cardiac and pulmonary histroy  Patient denies shortness of breath, fever, cough and chest pain at PAT appointment   Patient verbalized understanding of instructions that were given to them at the PAT appointment. Patient was also instructed that they will need to review over the PAT instructions again at home before surgery.

## 2017-11-12 NOTE — Progress Notes (Signed)
Patient ID: John Ball, male   DOB: [birth year], 77 y.o.   MRN: [id number] Reviewed diagnostic arteriogram with Dr. Donzetta Matters.  Has subtotal occlusion of his common femoral artery and stenosis at the origin of the deep femoral artery.  Long segment occlusion of the superficial femoral artery with reconstitution of a diseased popliteal artery above the knee and at the knee.  Below-knee popliteal artery is patent.  There is diffuse disease at the origin of all trifurcation vessels with two-vessel runoff with the posterior tibial being the dominant runoff.  I discussed options with the patient and also with his family.  He is with the critical limb ischemia.  Recommend common femoral and profundus endarterectomy.  He does not have any saphenous vein for bypass.  Would recommend left femoral to below-knee popliteal bypass at the same setting.  Explained that he may have resolution of his rest pain and superficial ulceration with femoral endarterectomy alone.  Even if he would fail patency of his femoropopliteal, would in all likelihood continue to have a viable foot.  Patient understands and agrees to surgery.  We will schedule this for Monday, Nov 17, 2017.  He will be discharged today after his appropriate bedrest and have admission day surgery for Monday

## 2017-11-12 NOTE — Progress Notes (Signed)
Anesthesia Chart Review:  DISCUSSION:  - Pt is a 77 year old male with hx CAD (s/p CABG; cath 2016 showed severe native disease and patent grafts except SVG to RCA was occluded). By notes, has angina but this has improved with addition of amlodipine to his meds - Hx lung cancer (s/p RL lobectomy), COPD with emphysema and chronic bronchitis, chronic respiratory failure with hypoxia, and ongoing tobacco abuse; uses home O2 2L at all times - I spoke with pt 11/12/17- he reports being at his usual respiratory baseline  - Hospitalized 3/8-9/19 for COPD exacerbation/ CAP/chronic hypoxic respiratory failure    VS: BP (!) 141/79   Pulse 81   Temp 36.6 C (Oral)   Resp 13   Ht 6' (1.829 m)   Wt 202 lb (91.6 kg)   SpO2 100%   BMI 27.40 kg/m   PROVIDERS: Mellody Dance, DO  Pulmonologist is Chesley Mires, MD. Last office visit 09/22/17 with Eric Form, NP Cardiologist is Glenetta Hew, MD. Last office visit 10/06/17    LABS: Labs reviewed: Acceptable for surgery. (all labs ordered are listed, but only abnormal results are displayed)  Labs Reviewed  SURGICAL PCR SCREEN - Abnormal; Notable for the following components:      Result Value   MRSA, PCR POSITIVE (*)    Staphylococcus aureus POSITIVE (*)    All other components within normal limits  CBC - Abnormal; Notable for the following components:   RBC 4.04 (*)    MCV 102.7 (*)    All other components within normal limits  COMPREHENSIVE METABOLIC PANEL - Abnormal; Notable for the following components:   Glucose, Bld 165 (*)    Total Protein 6.1 (*)    ALT 14 (*)    All other components within normal limits  URINALYSIS, ROUTINE W REFLEX MICROSCOPIC - Abnormal; Notable for the following components:   Specific Gravity, Urine >1.046 (*)    All other components within normal limits  POCT I-STAT, CHEM 8 - Abnormal; Notable for the following components:   Glucose, Bld 103 (*)    All other components within normal limits  APTT  PROTIME-INR   TYPE AND SCREEN     IMAGES:  CXR 09/22/17:  1. No active cardiopulmonary disease. Patchy consolidation in the anterior aspect of the right middle lobe has resolved. Unchanged atelectasis/scarring in the posterior aspect of the right middle lobe. 2. Diffuse interstitial thickening is unchanged and may be smoking-related.  CT angio 09/12/17:  1. No acute pulmonary embolism. 2. RIGHT greater than LEFT tree-in-bud infiltrates likely infectious, less likely inflammatory with RIGHT middle lobe atelectasis versus early pneumonia. 3. Status post RIGHT lower lobectomy. Increasing RIGHT hilar lymphadenopathy, potentially reactive though, recurrent neoplasm not excluded. Recommend close attention on follow-up imaging. 4. Aortic Atherosclerosis and Emphysema    EKG 09/12/17: Sinus tachycardia (94 bpm). Multiple premature complexes, vent & supraven. Abnormal T, consider ischemia, lateral leads. Baseline wander in lead(s) V2   CV:  Echo 09/26/17:  - Left ventricle: The cavity size was normal. Systolic function was at the lower limits of normal. The estimated ejection fraction was in the range of 50% to 55%. Possible mild hypokinesis of the inferolateral myocardium. Doppler parameters are consistent with abnormal left ventricular relaxation (grade 1 diastolic dysfunction). - Left atrium: The atrium was mildly dilated. - Right ventricle: The cavity size was mildly dilated. Wall thickness was normal. - Right atrium: The atrium was mildly dilated. - Pulmonary arteries: Systolic pressure was mildly increased. PA peak pressure: 36  mm Hg (S).  Nuclear stress test 09/12/17:   Nuclear stress EF: 42%.  Inferior, inferoseptal defect consistent with probable soft tissue attenuation and/or scar. No significant ischemia.  The left ventricular ejection fraction is moderately decreased (30-44%).  This is an intermediate risk study.  Carotid duplex 09/12/17:  - Right Carotid: Velocities in the right ICA are  consistent with a 1-39% stenosis. - Left Carotid: Velocities in the left ICA are consistent with a 1-39% stenosis. The ECA appears >50% stenosed. - Vertebrals: Both vertebral arteries were patent with antegrade flow. - Subclavians: Left subclavian artery flow was disturbed. Normal flow hemodynamics were seen in the right subclavian artery.  Cardiac cath 07/12/14:  - Mild LV dysfunction with an ejection fraction of 45-50% with mild inferior hypocontractility. - Significant native coronary obstructive disease with 70% proximal LM; 50% proximal LAD; occluded OM 2 branch arising from the distal CX; and proximal RCA occlusion after anterior RV marginal branch with left to right predominant septal collateralization to the distal RCA territory. - Patent LIMA graft supplying the mid LAD. - Patent SVG supplying the OM 2 vessel. - Patent free RIMA graft supplying the ramus intermediate vessel. - Occluded SVG which had previously supplied the RCA. - RECOMMENDATION: Medical therapy.    Past Medical History:  Diagnosis Date  . Anxiety   . Arthritis   . Complication of anesthesia    hx prolonged post op oxygen dependent/  hx hallucinations for 2 day post op lobectomy 2012  . COPD, severe ([place])   . Depression   . Essential hypertension   . Hearing loss of both ears    does wear his hearing aids  . History of arterial bypass of lower extremity    RIGHT FEM-POP  . History of CHF (congestive heart failure)   . History of DVT of lower extremity   . History of panic attacks   . Hyperlipidemia with target LDL less than 70   . Left main coronary artery disease 2004   a) Severe LM CAD 2004 -->s/p  cabg x4 (LIMA-LAD, free RIMA-RI, SVG-OM 2, SVG-RPDA); b) 06/2014: Abnormal Myoview --> c)cardiac Cath Jan 2016: pLM 70%, pLAD 40-50%- patent LIMA-LAD.  RI -competitive flow noted from RIMA-RI graft, circumflex, normal caliber with moderate OM1.  OM 2 occluded.  SVG-OM 2 patent. RCA CTO after large RV M, faint R-R  and LAD septal-PDA collaterals =--> new  . Neuropathy, peripheral   . 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 Nocturia   . OSA (obstructive sleep apnea)    uses O2  via [place]  --  intolerate to CPAP  . Peripheral arterial occlusive disease ([place])    2011   a) Gore-Tex graft right AK popA-BK popA --> b) 2012: thrombosed graft --> thrombectomy with dacron patch = 1 V runnof via Peroneal; c) 01/2014: R femoropopliteal bypass with left femoral vein;; followed by dr Early--  per last dopplers 06-28-2014 no change right graft,  50-74% stenosis common and left mid superficial femoral artery  . PTSD (post-traumatic stress disorder)    anxiety attack's---  [place] [employer]  . Recurrent productive cough    SMOKER'S COUGH  . 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  Medical management  . Smoker unmotivated to quit    Reportedly he came close to having  a nervous breakdown when he last tried to quit  . 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 Wears glasses     Past Surgical History:  Procedure Laterality Date  . CARDIAC CATHETERIZATION  07-13-2002   Ischemia RCA region on Cardiolite// 60-70% ostial left main, 50% midCx, 60-70% mid RI,  70% mRCA, 90% JeNu and crux 75% RCA ,  95% PLA//  severe LM and 3Vessel CAD, perserved LV, ef 60%  . CARDIOVASCULAR STRESS TEST  last one  06-17-2014   dr  Ellyn Hack   Carlton Adam study with no exercise; Intermediate Risk  Study;   moderate size and intensity, partially reversible inferior septal defect consistent with prior infarct and mild to moderate peri-infarct ischemia;  mild hypokinesis of the inferior septal wall,  normal LV function, ef 56%  . COLONOSCOPY  2008    WNL  . CORONARY ARTERY BYPASS GRAFT  07/27/2002   LIMA-LAD, freeRIMA-RI,SVG-OM2, SVG-rPDA; Dr. Roxan Hockey  . CYSTOSCOPY W/ URETERAL STENT PLACEMENT N/A 02/21/2015   Procedure: BILATERAL RETROGRADE PYELOGRAM;  Surgeon: Festus Aloe, MD;  [facility];  Service: Urology;  Laterality: N/A;  . EYE SURGERY Left Nov 2013  . FEMORAL ARTERY - POPLITEAL ARTERY BYPASS GRAFT Right 10-24-2008   dr early   w/ GoreTex graft  . FEMORAL-TIBIAL BYPASS GRAFT Right 01/10/2014   Procedure: Right Femoral to Posterior Tibial Bypass Graft using Left Leg Vein, Thrombectomy Right Common Femoral and Profunda of Leg . ;  Surgeon: Rosetta Posner, MD;  Location: [facility];  Service: Vascular;  Laterality: Right;  . HYDROCELE EXCISION Left   . LEFT ANKLE SURGERY  1990's  . LEFT HEART CATHETERIZATION WITH CORONARY/GRAFT ANGIOGRAM N/A 07/12/2014   Procedure: LEFT HEART CATHETERIZATION WITH Beatrix Fetters;  Surgeon: Troy Sine, MD;  [facility] C[facility] LAB;  Service: Cardiovascular;  Laterality: N/A;  pLM 70%, pLAD 40-50%- patent LIMA-LAD.  RI -competitive flow noted from fRIMA-RI graft, circumflex, normal caliber with moderate OM1.  OM 2 occluded.  SVG-OM 2 patent. RCA CTO after large RVM, faint R-R and LAD septal-PDA collateral  . NM MYOVIEW LTD  05/2014   Moderate sized, moderate intensity/partially reversible inferior defect consistent with prior infarct mild/moderate peri-infarct ischemia.  Inferoseptal HK.  INTERMEDIATE RISK. --> Cath showed CTO of Native RCA & SVG-rPDA.  Marland Kitchen RIGHT ABOVE KNEE POPLITEAL GRAFT TO BELOW KNEE POPLITEAL ARTERY BYPASS WITH REVERSE SAPHENOUS VEIN  03-02-2010  . THROMBECTOMY OF RIGHT FEMORAL TO ABOVE KNEE POPLITEAL  GORETEX GRAFT ANGIOPLASTY OF GORETEX AND SAPHENOUS VEIN JUNCTION AND ABOVE KNEE POPLITEAL ARTERY   10-06-2010   DR EARLY  . TONSILLECTOMY    . TRANSURETHRAL RESECTION OF BLADDER TUMOR N/A 02/21/2015   Procedure: TRANSURETHRAL RESECTION OF BLADDER TUMOR (TURBT);  Surgeon: Festus Aloe, MD;  Location: [facility];  Service: Urology;  Laterality: N/A;  . VIDEO ASSISTED THORACOSCOPY (VATS)/WEDGE RESECTION Right 05-01-2011    FORSYTH   LOWER LOBECTOMY W/  NODE DISSECTION    MEDICATIONS: . 0.9 %  sodium chloride infusion  .  0.9 %  sodium chloride infusion  . 0.9% sodium chloride infusion  . acetaminophen (TYLENOL) tablet 650 mg  . hydrALAZINE (APRESOLINE) injection 5 mg  . labetalol (NORMODYNE,TRANDATE) injection 10 mg  . ondansetron (ZOFRAN) injection 4 mg  . sodium chloride flush (NS) 0.9 % injection 3 mL  . sodium chloride flush (NS) 0.9 % injection 3 mL    If no changes, I anticipate pt can proceed with surgery as scheduled.   Willeen Cass, FNP-BC [facility]/Anesthesiology Phone: [phone] 11/13/2017 1:04 PM

## 2017-11-12 NOTE — Telephone Encounter (Signed)
Spoke to wife on phone. Instructed to be at [facility] admitting department at 9 am on 11/17/17 for surgery. NPO pas MN night prior and to follow the detailed instructions received from the pre-admission department about this surgery. Informed her that I have asked this department to get up with them today while patient is in the [facility].

## 2017-11-12 NOTE — H&P (View-Only) (Signed)
Patient ID: John Ball, male   DOB: 08-16-1940, 77 y.o.   MRN: [id number] Reviewed diagnostic arteriogram with Dr. Donzetta Matters.  Has subtotal occlusion of his common femoral artery and stenosis at the origin of the deep femoral artery.  Long segment occlusion of the superficial femoral artery with reconstitution of a diseased popliteal artery above the knee and at the knee.  Below-knee popliteal artery is patent.  There is diffuse disease at the origin of all trifurcation vessels with two-vessel runoff with the posterior tibial being the dominant runoff.  I discussed options with the patient and also with his family.  He is with the critical limb ischemia.  Recommend common femoral and profundus endarterectomy.  He does not have any saphenous vein for bypass.  Would recommend left femoral to below-knee popliteal bypass at the same setting.  Explained that he may have resolution of his rest pain and superficial ulceration with femoral endarterectomy alone.  Even if he would fail patency of his femoropopliteal, would in all likelihood continue to have a viable foot.  Patient understands and agrees to surgery.  We will schedule this for Monday, Nov 17, 2017.  He will be discharged today after his appropriate bedrest and have admission day surgery for Monday

## 2017-11-12 NOTE — Pre-Procedure Instructions (Signed)
John Ball  11/12/2017      [facility] [place], [place] [address] Phone: [phone] Fax: [phone]  [place] - [place] [address] [place]) [place] [address] Phone: [phone] Fax: [phone]    Your procedure is scheduled on May 13  Report to [place] at 0930 A.M.  Call this number if you have problems the morning of surgery:  [phone]   Remember:  Do not eat food or drink liquids after midnight.  Continue all medications as directed by your physician except follow these medication instructions before surgery below   Take these medicines the morning of surgery with A SIP OF WATER  acetaminophen (TYLENOL) if needed ADVAIR DISKUS  albuterol (PROVENTIL HFA;VENTOLIN HFA) clonazePAM (KLONOPIN) DULoxetine (CYMBALTA) tiotropium (SPIRIVA HANDIHALER)   7 days prior to surgery STOP taking any Aspirin(unless otherwise instructed by your surgeon), Aleve, Naproxen, Ibuprofen, Motrin, Advil, Goody's, BC's, all herbal medications, fish oil, and all vitamins   Do not wear jewelry  Do not wear lotions, powders, or cologne, or deodorant.  Men may shave face and neck.  Do not bring valuables to the hospital.  [facility] is not responsible for any belongings or valuables.  Contacts, dentures or bridgework may not be worn into surgery.  Leave your suitcase in the car.  After surgery it may be brought to your room.  For patients admitted to the hospital, discharge time will be determined by your treatment team.  Patients discharged the day of surgery will not be allowed to drive home.    Special instructions:   [facility]- Preparing For Surgery  Before surgery, you can play an important role. Because skin is not sterile, your skin needs to be as free of germs as possible. You can reduce the number of germs  on your skin by washing with CHG (chlorahexidine gluconate) Soap before surgery.  CHG is an antiseptic cleaner which kills germs and bonds with the skin to continue killing germs even after washing.  Oral Hygiene is also important to reduce your risk of infection.  Remember - BRUSH YOUR TEETH THE MORNING OF SURGERY  Please do not use if you have an allergy to CHG or antibacterial soaps. If your skin becomes reddened/irritated stop using the CHG.  Do not shave (including legs and underarms) for at least 48 hours prior to first CHG shower. It is OK to shave your face.  Please follow these instructions carefully.   1. Shower the NIGHT BEFORE SURGERY and the MORNING OF SURGERY with CHG.   2. If you chose to wash your hair, wash your hair first as usual with your normal shampoo.  3. After you shampoo, rinse your hair and body thoroughly to remove the shampoo.  4. Use CHG as you would any other liquid soap. You can apply CHG directly to the skin and wash gently with a scrungie or a clean washcloth.   5. Apply the CHG Soap to your body ONLY FROM THE NECK DOWN.  Do not use on open wounds or open sores. Avoid contact with your eyes, ears, mouth and genitals (private parts). Wash Face and genitals (private parts)  with your normal soap.  6. Wash thoroughly, paying special attention to the area where your surgery will be performed.  7. Thoroughly rinse your body with warm water from the neck down.  8. DO NOT shower/wash with your normal soap after using and rinsing off the CHG Soap.  9. Pat yourself dry with a CLEAN TOWEL.  10. Wear CLEAN PAJAMAS to bed the night before surgery, wear comfortable clothes the morning of surgery  11. Place CLEAN SHEETS on your bed the night of your first shower and DO NOT SLEEP WITH PETS.    Day of Surgery: Do not apply any deodorants/lotions. Please wear clean clothes to the hospital/surgery center.  Remember to brush your teeth.      Please read over the  following fact sheets that you were given.

## 2017-11-12 NOTE — Progress Notes (Signed)
Site area: Right groin a 5 french arterial sheath was removed  Site Prior to Removal:  Level 0  Pressure Applied For 20 MINUTES    Bedrest Beginning at 1115am  Manual:   Yes.    Patient Status During Pull:  stable  Post Pull Groin Site:  Level 0  Post Pull Instructions Given:  Yes.    Post Pull Pulses Present:  Yes.    Dressing Applied:  Yes.    Comments:         Vs remain stable

## 2017-11-13 ENCOUNTER — Encounter (HOSPITAL_COMMUNITY): Payer: Self-pay | Admitting: Vascular Surgery

## 2017-11-13 ENCOUNTER — Telehealth: Payer: Self-pay | Admitting: *Deleted

## 2017-11-13 MED FILL — Heparin Sod (Porcine)-NaCl IV Soln 1000 Unit/500ML-0.9%: INTRAVENOUS | Qty: 1000 | Status: AC

## 2017-11-13 NOTE — Telephone Encounter (Signed)
Call to patient's wife instructed to be at hospital admitting department at 5:30 am (change from 9am) on 11/17/17 for surgery. All other instructions unchanged. Verbalized understanding.

## 2017-11-17 ENCOUNTER — Inpatient Hospital Stay (HOSPITAL_COMMUNITY)
Admission: RE | Admit: 2017-11-17 | Discharge: 2017-11-18 | DRG: 254 | Disposition: A | Discharge status: Home Health | Payer: Medicare Other | Attending: Vascular Surgery | Admitting: Vascular Surgery

## 2017-11-17 ENCOUNTER — Other Ambulatory Visit: Payer: Self-pay

## 2017-11-17 ENCOUNTER — Inpatient Hospital Stay (HOSPITAL_COMMUNITY): Payer: Medicare Other | Admitting: Certified Registered Nurse Anesthetist

## 2017-11-17 ENCOUNTER — Encounter (HOSPITAL_COMMUNITY): Payer: Self-pay | Admitting: Surgery

## 2017-11-17 ENCOUNTER — Encounter (HOSPITAL_COMMUNITY)
Admission: RE | Disposition: A | Discharge status: Home Health | Payer: Self-pay | Source: Home / Self Care | Attending: Vascular Surgery

## 2017-11-17 ENCOUNTER — Inpatient Hospital Stay (HOSPITAL_COMMUNITY): Payer: Medicare Other | Admitting: Emergency Medicine

## 2017-11-17 DIAGNOSIS — Z972 Presence of dental prosthetic device (complete) (partial): Secondary | ICD-10-CM

## 2017-11-17 DIAGNOSIS — Z86718 Personal history of other venous thrombosis and embolism: Secondary | ICD-10-CM | POA: Diagnosis not present

## 2017-11-17 DIAGNOSIS — Z85118 Personal history of other malignant neoplasm of bronchus and lung: Secondary | ICD-10-CM | POA: Diagnosis not present

## 2017-11-17 DIAGNOSIS — M1990 Unspecified osteoarthritis, unspecified site: Secondary | ICD-10-CM | POA: Diagnosis present

## 2017-11-17 DIAGNOSIS — F411 Generalized anxiety disorder: Secondary | ICD-10-CM | POA: Diagnosis present

## 2017-11-17 DIAGNOSIS — Z951 Presence of aortocoronary bypass graft: Secondary | ICD-10-CM | POA: Diagnosis not present

## 2017-11-17 DIAGNOSIS — J449 Chronic obstructive pulmonary disease, unspecified: Secondary | ICD-10-CM | POA: Diagnosis present

## 2017-11-17 DIAGNOSIS — K000 Anodontia: Secondary | ICD-10-CM | POA: Diagnosis present

## 2017-11-17 DIAGNOSIS — F4310 Post-traumatic stress disorder, unspecified: Secondary | ICD-10-CM | POA: Diagnosis present

## 2017-11-17 DIAGNOSIS — G629 Polyneuropathy, unspecified: Secondary | ICD-10-CM | POA: Diagnosis present

## 2017-11-17 DIAGNOSIS — I998 Other disorder of circulatory system: Secondary | ICD-10-CM | POA: Diagnosis present

## 2017-11-17 DIAGNOSIS — I2582 Chronic total occlusion of coronary artery: Secondary | ICD-10-CM | POA: Diagnosis present

## 2017-11-17 DIAGNOSIS — I70202 Unspecified atherosclerosis of native arteries of extremities, left leg: Principal | ICD-10-CM | POA: Diagnosis present

## 2017-11-17 DIAGNOSIS — E785 Hyperlipidemia, unspecified: Secondary | ICD-10-CM | POA: Diagnosis present

## 2017-11-17 DIAGNOSIS — H9193 Unspecified hearing loss, bilateral: Secondary | ICD-10-CM | POA: Diagnosis present

## 2017-11-17 DIAGNOSIS — G4733 Obstructive sleep apnea (adult) (pediatric): Secondary | ICD-10-CM | POA: Diagnosis present

## 2017-11-17 DIAGNOSIS — I110 Hypertensive heart disease with heart failure: Secondary | ICD-10-CM | POA: Diagnosis present

## 2017-11-17 DIAGNOSIS — I2510 Atherosclerotic heart disease of native coronary artery without angina pectoris: Secondary | ICD-10-CM | POA: Diagnosis present

## 2017-11-17 DIAGNOSIS — I70222 Atherosclerosis of native arteries of extremities with rest pain, left leg: Secondary | ICD-10-CM | POA: Diagnosis not present

## 2017-11-17 DIAGNOSIS — I739 Peripheral vascular disease, unspecified: Secondary | ICD-10-CM | POA: Diagnosis present

## 2017-11-17 DIAGNOSIS — F17200 Nicotine dependence, unspecified, uncomplicated: Secondary | ICD-10-CM | POA: Diagnosis present

## 2017-11-17 DIAGNOSIS — I509 Heart failure, unspecified: Secondary | ICD-10-CM | POA: Diagnosis present

## 2017-11-17 DIAGNOSIS — I5032 Chronic diastolic (congestive) heart failure: Secondary | ICD-10-CM | POA: Diagnosis not present

## 2017-11-17 HISTORY — PX: ENDARTERECTOMY FEMORAL: SHX5804

## 2017-11-17 LAB — CBC
HEMATOCRIT: 39.8 % (ref 39.0–52.0)
HEMOGLOBIN: 12.8 g/dL — AB (ref 13.0–17.0)
MCH: 33.3 pg (ref 26.0–34.0)
MCHC: 32.2 g/dL (ref 30.0–36.0)
MCV: 103.6 fL — ABNORMAL HIGH (ref 78.0–100.0)
Platelets: 139 10*3/uL — ABNORMAL LOW (ref 150–400)
RBC: 3.84 MIL/uL — AB (ref 4.22–5.81)
RDW: 13.9 % (ref 11.5–15.5)
WBC: 7.5 10*3/uL (ref 4.0–10.5)

## 2017-11-17 LAB — CREATININE, SERUM: CREATININE: 1.02 mg/dL (ref 0.61–1.24)

## 2017-11-17 SURGERY — ENDARTERECTOMY, FEMORAL
Anesthesia: General | Site: Leg Upper | Laterality: Left

## 2017-11-17 MED ORDER — PROTAMINE SULFATE 10 MG/ML IV SOLN
INTRAVENOUS | Status: DC | PRN
  Administered 2017-11-17: 40 mg via INTRAVENOUS
  Administered 2017-11-17: 10 mg via INTRAVENOUS

## 2017-11-17 MED ORDER — 0.9 % SODIUM CHLORIDE (POUR BTL) OPTIME
TOPICAL | Status: DC | PRN
  Administered 2017-11-17: 2000 mL

## 2017-11-17 MED ORDER — LABETALOL HCL 5 MG/ML IV SOLN: 10.0000 mg | INTRAVENOUS | Status: DC | PRN

## 2017-11-17 MED ORDER — HEPARIN SODIUM (PORCINE) 1000 UNIT/ML IJ SOLN
INTRAMUSCULAR | Status: AC
  Filled 2017-11-17: qty 1

## 2017-11-17 MED ORDER — DEXAMETHASONE SODIUM PHOSPHATE 10 MG/ML IJ SOLN
INTRAMUSCULAR | Status: DC | PRN
  Administered 2017-11-17: 10 mg via INTRAVENOUS

## 2017-11-17 MED ORDER — RAMIPRIL 2.5 MG PO CAPS
2.5000 mg | ORAL_CAPSULE | Freq: Every day | ORAL | Status: DC
  Administered 2017-11-18: 2.5 mg via ORAL
  Filled 2017-11-17: qty 1

## 2017-11-17 MED ORDER — ONDANSETRON HCL 4 MG/2ML IJ SOLN
INTRAMUSCULAR | Status: DC | PRN
  Administered 2017-11-17: 4 mg via INTRAVENOUS

## 2017-11-17 MED ORDER — QUETIAPINE FUMARATE ER 300 MG PO TB24
300.0000 mg | ORAL_TABLET | Freq: Every day | ORAL | Status: DC
  Administered 2017-11-17: 300 mg via ORAL
  Filled 2017-11-17: qty 1

## 2017-11-17 MED ORDER — LIDOCAINE 2% (20 MG/ML) 5 ML SYRINGE
INTRAMUSCULAR | Status: AC
  Filled 2017-11-17: qty 5

## 2017-11-17 MED ORDER — ONDANSETRON HCL 4 MG/2ML IJ SOLN: 4.0000 mg | Freq: Once | INTRAMUSCULAR | Status: DC | PRN

## 2017-11-17 MED ORDER — ROCURONIUM BROMIDE 50 MG/5ML IV SOLN
INTRAVENOUS | Status: AC
  Filled 2017-11-17: qty 1

## 2017-11-17 MED ORDER — CHLORHEXIDINE GLUCONATE 4 % EX LIQD: 60.0000 mL | Freq: Once | CUTANEOUS | Status: DC

## 2017-11-17 MED ORDER — ACETAMINOPHEN 325 MG RE SUPP: 325.0000 mg | RECTAL | Status: DC | PRN

## 2017-11-17 MED ORDER — CEFAZOLIN SODIUM-DEXTROSE 2-4 GM/100ML-% IV SOLN
2.0000 g | Freq: Three times a day (TID) | INTRAVENOUS | Status: AC
  Administered 2017-11-17 – 2017-11-18 (×2): 2 g via INTRAVENOUS
  Filled 2017-11-17 (×2): qty 100

## 2017-11-17 MED ORDER — SODIUM CHLORIDE 0.9 % IV SOLN
INTRAVENOUS | Status: AC
  Filled 2017-11-17: qty 1.2

## 2017-11-17 MED ORDER — FENTANYL CITRATE (PF) 250 MCG/5ML IJ SOLN
INTRAMUSCULAR | Status: AC
  Filled 2017-11-17: qty 5

## 2017-11-17 MED ORDER — PANTOPRAZOLE SODIUM 40 MG PO TBEC
40.0000 mg | DELAYED_RELEASE_TABLET | Freq: Every day | ORAL | Status: DC
  Administered 2017-11-17 – 2017-11-18 (×2): 40 mg via ORAL
  Filled 2017-11-17 (×2): qty 1

## 2017-11-17 MED ORDER — PROPOFOL 10 MG/ML IV BOLUS
INTRAVENOUS | Status: DC | PRN
  Administered 2017-11-17: 110 mg via INTRAVENOUS

## 2017-11-17 MED ORDER — PROPOFOL 10 MG/ML IV BOLUS
INTRAVENOUS | Status: AC
  Filled 2017-11-17: qty 20

## 2017-11-17 MED ORDER — SODIUM CHLORIDE 0.9 % IV SOLN
INTRAVENOUS | Status: DC | PRN
  Administered 2017-11-17: 500 mL

## 2017-11-17 MED ORDER — FENTANYL CITRATE (PF) 100 MCG/2ML IJ SOLN
25.0000 ug | INTRAMUSCULAR | Status: DC | PRN
  Administered 2017-11-17: 25 ug via INTRAVENOUS

## 2017-11-17 MED ORDER — OXYCODONE HCL 5 MG PO TABS
5.0000 mg | ORAL_TABLET | ORAL | Status: DC | PRN
  Administered 2017-11-17: 10 mg via ORAL

## 2017-11-17 MED ORDER — CEFAZOLIN SODIUM-DEXTROSE 2-4 GM/100ML-% IV SOLN
2.0000 g | INTRAVENOUS | Status: AC
  Administered 2017-11-17: 2 g via INTRAVENOUS

## 2017-11-17 MED ORDER — POTASSIUM CHLORIDE CRYS ER 20 MEQ PO TBCR: 20.0000 meq | EXTENDED_RELEASE_TABLET | Freq: Every day | ORAL | Status: DC | PRN

## 2017-11-17 MED ORDER — SODIUM CHLORIDE 0.9 % IV SOLN: 500.0000 mL | Freq: Once | INTRAVENOUS | Status: DC | PRN

## 2017-11-17 MED ORDER — ALBUTEROL SULFATE HFA 108 (90 BASE) MCG/ACT IN AERS
INHALATION_SPRAY | RESPIRATORY_TRACT | Status: AC
  Filled 2017-11-17: qty 6.7

## 2017-11-17 MED ORDER — CLONAZEPAM 1 MG PO TABS
1.0000 mg | ORAL_TABLET | Freq: Two times a day (BID) | ORAL | Status: DC
  Administered 2017-11-17 – 2017-11-18 (×2): 1 mg via ORAL
  Filled 2017-11-17 (×2): qty 1

## 2017-11-17 MED ORDER — DEXAMETHASONE SODIUM PHOSPHATE 10 MG/ML IJ SOLN
INTRAMUSCULAR | Status: AC
  Filled 2017-11-17: qty 1

## 2017-11-17 MED ORDER — ZOLPIDEM TARTRATE 5 MG PO TABS
5.0000 mg | ORAL_TABLET | Freq: Every evening | ORAL | Status: DC | PRN
Start: 2017-11-17 — End: 2017-11-18
  Administered 2017-11-18: 5 mg via ORAL
  Filled 2017-11-17: qty 1

## 2017-11-17 MED ORDER — SODIUM CHLORIDE 0.9 % IV SOLN: INTRAVENOUS | Status: DC

## 2017-11-17 MED ORDER — NITROGLYCERIN 0.4 MG SL SUBL: 0.4000 mg | SUBLINGUAL_TABLET | SUBLINGUAL | Status: DC | PRN

## 2017-11-17 MED ORDER — LACTATED RINGERS IV SOLN
INTRAVENOUS | Status: DC | PRN
  Administered 2017-11-17 (×2): via INTRAVENOUS

## 2017-11-17 MED ORDER — PRAZOSIN HCL 1 MG PO CAPS
1.0000 mg | ORAL_CAPSULE | Freq: Every day | ORAL | Status: DC
  Administered 2017-11-17: 1 mg via ORAL
  Filled 2017-11-17: qty 1

## 2017-11-17 MED ORDER — BISACODYL 5 MG PO TBEC: 5.0000 mg | DELAYED_RELEASE_TABLET | Freq: Every day | ORAL | Status: DC | PRN

## 2017-11-17 MED ORDER — ACETAMINOPHEN 325 MG PO TABS
325.0000 mg | ORAL_TABLET | ORAL | Status: DC | PRN
  Administered 2017-11-17: 650 mg via ORAL
  Filled 2017-11-17: qty 2

## 2017-11-17 MED ORDER — DULOXETINE HCL 30 MG PO CPEP: 30.0000 mg | ORAL_CAPSULE | Freq: Two times a day (BID) | ORAL | Status: DC

## 2017-11-17 MED ORDER — OXYCODONE HCL 5 MG PO TABS
ORAL_TABLET | ORAL | Status: AC
  Filled 2017-11-17: qty 2

## 2017-11-17 MED ORDER — ROCURONIUM BROMIDE 50 MG/5ML IV SOSY
PREFILLED_SYRINGE | INTRAVENOUS | Status: DC | PRN
  Administered 2017-11-17: 20 mg via INTRAVENOUS
  Administered 2017-11-17: 50 mg via INTRAVENOUS
  Administered 2017-11-17: 30 mg via INTRAVENOUS
  Administered 2017-11-17: 20 mg via INTRAVENOUS
  Administered 2017-11-17: 10 mg via INTRAVENOUS

## 2017-11-17 MED ORDER — SUGAMMADEX SODIUM 200 MG/2ML IV SOLN
INTRAVENOUS | Status: DC | PRN
  Administered 2017-11-17: 300 mg via INTRAVENOUS

## 2017-11-17 MED ORDER — ONDANSETRON HCL 4 MG/2ML IJ SOLN: 4.0000 mg | Freq: Four times a day (QID) | INTRAMUSCULAR | Status: DC | PRN

## 2017-11-17 MED ORDER — DULOXETINE HCL 60 MG PO CPEP: 60.0000 mg | ORAL_CAPSULE | Freq: Every day | ORAL | Status: DC

## 2017-11-17 MED ORDER — LIDOCAINE 2% (20 MG/ML) 5 ML SYRINGE
INTRAMUSCULAR | Status: DC | PRN
  Administered 2017-11-17: 100 mg via INTRAVENOUS

## 2017-11-17 MED ORDER — DOCUSATE SODIUM 100 MG PO CAPS
100.0000 mg | ORAL_CAPSULE | Freq: Every day | ORAL | Status: DC
  Administered 2017-11-18: 100 mg via ORAL
  Filled 2017-11-17: qty 1

## 2017-11-17 MED ORDER — GUAIFENESIN-DM 100-10 MG/5ML PO SYRP: 15.0000 mL | ORAL_SOLUTION | ORAL | Status: DC | PRN

## 2017-11-17 MED ORDER — HEPARIN SODIUM (PORCINE) 1000 UNIT/ML IJ SOLN
INTRAMUSCULAR | Status: DC | PRN
  Administered 2017-11-17: 9000 [IU] via INTRAVENOUS

## 2017-11-17 MED ORDER — DULOXETINE HCL 60 MG PO CPEP
60.0000 mg | ORAL_CAPSULE | Freq: Every day | ORAL | Status: DC
  Administered 2017-11-18: 60 mg via ORAL
  Filled 2017-11-17: qty 1

## 2017-11-17 MED ORDER — TRAZODONE HCL 150 MG PO TABS: 75.0000 mg | ORAL_TABLET | Freq: Every evening | ORAL | Status: DC | PRN

## 2017-11-17 MED ORDER — PHENYLEPHRINE 40 MCG/ML (10ML) SYRINGE FOR IV PUSH (FOR BLOOD PRESSURE SUPPORT)
PREFILLED_SYRINGE | INTRAVENOUS | Status: AC
  Filled 2017-11-17: qty 10

## 2017-11-17 MED ORDER — FENTANYL CITRATE (PF) 100 MCG/2ML IJ SOLN
INTRAMUSCULAR | Status: AC
  Filled 2017-11-17: qty 2

## 2017-11-17 MED ORDER — DULOXETINE HCL 30 MG PO CPEP
30.0000 mg | ORAL_CAPSULE | Freq: Every evening | ORAL | Status: DC
  Filled 2017-11-17: qty 1

## 2017-11-17 MED ORDER — PHENOL 1.4 % MT LIQD: 1.0000 | OROMUCOSAL | Status: DC | PRN

## 2017-11-17 MED ORDER — TIOTROPIUM BROMIDE MONOHYDRATE 18 MCG IN CAPS
18.0000 ug | ORAL_CAPSULE | Freq: Every day | RESPIRATORY_TRACT | Status: DC
  Filled 2017-11-17: qty 5

## 2017-11-17 MED ORDER — DULOXETINE HCL 30 MG PO CPEP: 30.0000 mg | ORAL_CAPSULE | Freq: Every evening | ORAL | Status: DC

## 2017-11-17 MED ORDER — CEFAZOLIN SODIUM-DEXTROSE 2-4 GM/100ML-% IV SOLN
INTRAVENOUS | Status: AC
  Filled 2017-11-17: qty 100

## 2017-11-17 MED ORDER — SODIUM CHLORIDE 0.9 % IV SOLN
INTRAVENOUS | Status: DC
  Administered 2017-11-17: 11:00:00 via INTRAVENOUS

## 2017-11-17 MED ORDER — HYDROMORPHONE HCL 1 MG/ML IJ SOLN
0.5000 mg | INTRAMUSCULAR | Status: DC | PRN
  Administered 2017-11-17: 1 mg via INTRAVENOUS
  Filled 2017-11-17: qty 1

## 2017-11-17 MED ORDER — DULOXETINE HCL 30 MG PO CPEP
30.0000 mg | ORAL_CAPSULE | Freq: Every day | ORAL | Status: DC
Start: 2017-11-18 — End: 2017-11-18
  Administered 2017-11-18: 30 mg via ORAL
  Filled 2017-11-17: qty 1

## 2017-11-17 MED ORDER — DULOXETINE HCL 30 MG PO CPEP
30.0000 mg | ORAL_CAPSULE | Freq: Every day | ORAL | Status: DC
  Administered 2017-11-17: 30 mg via ORAL
  Filled 2017-11-17: qty 1

## 2017-11-17 MED ORDER — ALBUTEROL SULFATE (2.5 MG/3ML) 0.083% IN NEBU: 2.5000 mg | INHALATION_SOLUTION | Freq: Four times a day (QID) | RESPIRATORY_TRACT | Status: DC | PRN

## 2017-11-17 MED ORDER — ENOXAPARIN SODIUM 30 MG/0.3ML ~~LOC~~ SOLN
30.0000 mg | SUBCUTANEOUS | Status: DC
  Filled 2017-11-17: qty 0.3

## 2017-11-17 MED ORDER — PHENYLEPHRINE 40 MCG/ML (10ML) SYRINGE FOR IV PUSH (FOR BLOOD PRESSURE SUPPORT)
PREFILLED_SYRINGE | INTRAVENOUS | Status: DC | PRN
  Administered 2017-11-17: 40 ug via INTRAVENOUS
  Administered 2017-11-17 (×2): 80 ug via INTRAVENOUS
  Administered 2017-11-17: 120 ug via INTRAVENOUS
  Administered 2017-11-17: 80 ug via INTRAVENOUS

## 2017-11-17 MED ORDER — SENNOSIDES-DOCUSATE SODIUM 8.6-50 MG PO TABS: 1.0000 | ORAL_TABLET | Freq: Every evening | ORAL | Status: DC | PRN

## 2017-11-17 MED ORDER — FENTANYL CITRATE (PF) 100 MCG/2ML IJ SOLN
INTRAMUSCULAR | Status: DC | PRN
  Administered 2017-11-17 (×2): 50 ug via INTRAVENOUS

## 2017-11-17 MED ORDER — ALBUTEROL SULFATE HFA 108 (90 BASE) MCG/ACT IN AERS: 1.0000 | INHALATION_SPRAY | Freq: Four times a day (QID) | RESPIRATORY_TRACT | Status: DC | PRN

## 2017-11-17 MED ORDER — SUGAMMADEX SODIUM 500 MG/5ML IV SOLN
INTRAVENOUS | Status: AC
  Filled 2017-11-17: qty 5

## 2017-11-17 MED ORDER — GABAPENTIN 100 MG PO CAPS
100.0000 mg | ORAL_CAPSULE | Freq: Every day | ORAL | Status: DC
  Administered 2017-11-17: 100 mg via ORAL
  Filled 2017-11-17: qty 1

## 2017-11-17 MED ORDER — PHENYLEPHRINE HCL 10 MG/ML IJ SOLN
INTRAVENOUS | Status: DC | PRN
  Administered 2017-11-17: 20 ug/min via INTRAVENOUS

## 2017-11-17 MED ORDER — PROTAMINE SULFATE 10 MG/ML IV SOLN
INTRAVENOUS | Status: AC
  Filled 2017-11-17: qty 5

## 2017-11-17 MED ORDER — HYDRALAZINE HCL 20 MG/ML IJ SOLN: 5.0000 mg | INTRAMUSCULAR | Status: DC | PRN

## 2017-11-17 MED ORDER — MAGNESIUM SULFATE 2 GM/50ML IV SOLN: 2.0000 g | Freq: Every day | INTRAVENOUS | Status: DC | PRN

## 2017-11-17 MED ORDER — METOPROLOL TARTRATE 5 MG/5ML IV SOLN: 2.0000 mg | INTRAVENOUS | Status: DC | PRN

## 2017-11-17 MED ORDER — ALUM & MAG HYDROXIDE-SIMETH 200-200-20 MG/5ML PO SUSP: 15.0000 mL | ORAL | Status: DC | PRN

## 2017-11-17 MED ORDER — ONDANSETRON HCL 4 MG/2ML IJ SOLN
INTRAMUSCULAR | Status: AC
  Filled 2017-11-17: qty 2

## 2017-11-17 MED ORDER — ALBUTEROL SULFATE HFA 108 (90 BASE) MCG/ACT IN AERS
INHALATION_SPRAY | RESPIRATORY_TRACT | Status: DC | PRN
  Administered 2017-11-17: 2 via RESPIRATORY_TRACT

## 2017-11-17 SURGICAL SUPPLY — 56 items
ADH SKN CLS APL DERMABOND .7 (GAUZE/BANDAGES/DRESSINGS) ×2
BANDAGE ESMARK 6X9 LF (GAUZE/BANDAGES/DRESSINGS) IMPLANT
BNDG CMPR 9X6 STRL LF SNTH (GAUZE/BANDAGES/DRESSINGS)
BNDG ESMARK 6X9 LF (GAUZE/BANDAGES/DRESSINGS)
CANISTER SUCT 3000ML PPV (MISCELLANEOUS) ×4 IMPLANT
CANNULA VESSEL 3MM 2 BLNT TIP (CANNULA) ×8 IMPLANT
CATH EMB 4FR 40CM (CATHETERS) ×4 IMPLANT
CLIP LIGATING EXTRA MED SLVR (CLIP) ×4 IMPLANT
CLIP LIGATING EXTRA SM BLUE (MISCELLANEOUS) ×4 IMPLANT
CUFF TOURNIQUET SINGLE 34IN LL (TOURNIQUET CUFF) IMPLANT
CUFF TOURNIQUET SINGLE 44IN (TOURNIQUET CUFF) IMPLANT
DERMABOND ADVANCED (GAUZE/BANDAGES/DRESSINGS) ×2
DERMABOND ADVANCED .7 DNX12 (GAUZE/BANDAGES/DRESSINGS) ×2 IMPLANT
DRAIN SNY 10X20 3/4 PERF (WOUND CARE) IMPLANT
DRAPE HALF SHEET 40X57 (DRAPES) IMPLANT
DRAPE X-RAY CASS 24X20 (DRAPES) IMPLANT
ELECT REM PT RETURN 9FT ADLT (ELECTROSURGICAL) ×4
ELECTRODE REM PT RTRN 9FT ADLT (ELECTROSURGICAL) ×2 IMPLANT
EVACUATOR SILICONE 100CC (DRAIN) IMPLANT
GLOVE BIO SURGEON STRL SZ 6.5 (GLOVE) ×3 IMPLANT
GLOVE BIO SURGEON STRL SZ7.5 (GLOVE) ×4 IMPLANT
GLOVE BIO SURGEONS STRL SZ 6.5 (GLOVE) ×1
GLOVE BIOGEL PI IND STRL 6.5 (GLOVE) ×10 IMPLANT
GLOVE BIOGEL PI IND STRL 7.5 (GLOVE) ×2 IMPLANT
GLOVE BIOGEL PI IND STRL 8 (GLOVE) ×2 IMPLANT
GLOVE BIOGEL PI INDICATOR 6.5 (GLOVE) ×10
GLOVE BIOGEL PI INDICATOR 7.5 (GLOVE) ×2
GLOVE BIOGEL PI INDICATOR 8 (GLOVE) ×2
GLOVE ECLIPSE 7.0 STRL STRAW (GLOVE) ×4 IMPLANT
GLOVE SS BIOGEL STRL SZ 7.5 (GLOVE) ×2 IMPLANT
GLOVE SUPERSENSE BIOGEL SZ 7.5 (GLOVE) ×2
GOWN STRL REUS W/ TWL LRG LVL3 (GOWN DISPOSABLE) ×8 IMPLANT
GOWN STRL REUS W/TWL LRG LVL3 (GOWN DISPOSABLE) ×16
INSERT FOGARTY SM (MISCELLANEOUS) IMPLANT
KIT BASIN OR (CUSTOM PROCEDURE TRAY) ×4 IMPLANT
KIT TURNOVER KIT B (KITS) ×4 IMPLANT
NS IRRIG 1000ML POUR BTL (IV SOLUTION) ×8 IMPLANT
PACK PERIPHERAL VASCULAR (CUSTOM PROCEDURE TRAY) ×4 IMPLANT
PAD ARMBOARD 7.5X6 YLW CONV (MISCELLANEOUS) ×8 IMPLANT
PADDING CAST COTTON 6X4 STRL (CAST SUPPLIES) IMPLANT
PATCH HEMASHIELD 8X150 (Vascular Products) ×4 IMPLANT
SET COLLECT BLD 21X3/4 12 (NEEDLE) IMPLANT
STOPCOCK 4 WAY LG BORE MALE ST (IV SETS) ×4 IMPLANT
SUT ETHILON 3 0 PS 1 (SUTURE) IMPLANT
SUT PROLENE 5 0 C 1 24 (SUTURE) ×8 IMPLANT
SUT PROLENE 6 0 CC (SUTURE) ×4 IMPLANT
SUT SILK 2 0 SH (SUTURE) ×4 IMPLANT
SUT VIC AB 2-0 CTX 36 (SUTURE) ×8 IMPLANT
SUT VIC AB 3-0 SH 27 (SUTURE) ×8
SUT VIC AB 3-0 SH 27X BRD (SUTURE) ×4 IMPLANT
SYRINGE 3CC LL L/F (MISCELLANEOUS) ×4 IMPLANT
TOWEL GREEN STERILE (TOWEL DISPOSABLE) ×4 IMPLANT
TRAY FOLEY MTR SLVR 16FR STAT (SET/KITS/TRAYS/PACK) ×4 IMPLANT
TUBING EXTENTION W/L.L. (IV SETS) IMPLANT
UNDERPAD 30X30 (UNDERPADS AND DIAPERS) ×4 IMPLANT
WATER STERILE IRR 1000ML POUR (IV SOLUTION) ×4 IMPLANT

## 2017-11-17 NOTE — Transfer of Care (Signed)
Immediate Anesthesia Transfer of Care Note  Patient: JATINDER MCDONAGH  Procedure(s) Performed: LEFT Femoral Endarterectomy with Patch Angioplasty (Left Leg Upper)  Patient Location: PACU  Anesthesia Type:General  Level of Consciousness: awake, alert  and oriented  Airway & Oxygen Therapy: Patient Spontanous Breathing and Patient connected to face mask oxygen  Post-op Assessment: Report given to RN and Post -op Vital signs reviewed and stable  Post vital signs: Reviewed and stable  Last Vitals:  Vitals Value Taken Time  BP 134/63 11/17/2017 10:39 AM  Temp    Pulse    Resp 14 11/17/2017 10:39 AM  SpO2    Vitals shown include unvalidated device data.  Last Pain:  Vitals:   11/17/17 0636  TempSrc:   PainSc: 5       Patients Stated Pain Goal: 5 (48/18/56 3149)  Complications: No apparent anesthesia complications

## 2017-11-17 NOTE — Progress Notes (Signed)
Order for NPO for four hours for pt. Explained NPO and the necessity for this after a procedure. Pt and family refuse and wife is feeding pt a hotdog from cafeteria. Pt A/Ox4 and able to swallow. Will continue to monitor.  [name], RN

## 2017-11-17 NOTE — Interval H&P Note (Signed)
History and Physical Interval Note:  11/17/2017 7:22 AM  John Ball  has presented today for surgery, with the diagnosis of CRITICAL LIMB ISCHEMIA LEFT LOWER EXTREMITY  The various methods of treatment have been discussed with the patient and family. After consideration of risks, benefits and other options for treatment, the patient has consented to  Procedure(s): ENDARTERECTOMY FEMORAL LEFT (Left) BYPASS GRAFT FEMORAL-POPLITEAL ARTERY (Left) as a surgical intervention .  The patient's history has been reviewed, patient examined, no change in status, stable for surgery.  I have reviewed the patient's chart and labs.  Questions were answered to the patient's satisfaction.     Curt Jews

## 2017-11-17 NOTE — Anesthesia Preprocedure Evaluation (Addendum)
Anesthesia Evaluation  Patient identified by MRN, date of birth, ID band Patient awake    Reviewed: Allergy & Precautions, NPO status , Patient's Chart, lab work & pertinent test results  Airway Mallampati: III  TM Distance: >3 FB Neck ROM: Full    Dental  (+) Edentulous Upper, Edentulous Lower   Pulmonary sleep apnea and Oxygen sleep apnea , COPD,  COPD inhaler and oxygen dependent, Current Smoker,  Pulmonologist is Chesley Mires, MD.   Pulmonary exam normal breath sounds clear to auscultation       Cardiovascular hypertension, + CAD, + CABG (x 4 in 2004), + Peripheral Vascular Disease, +CHF and + DVT  Normal cardiovascular exam Rhythm:Regular Rate:Normal  ECG: ST, HR 94  ECHO: Left ventricle: The cavity size was normal. Systolic function was at the lower limits of normal. The estimated ejection fraction was in the range of 50% to 55%. Possible mild hypokinesis of the inferolateral myocardium. Doppler parameters are consistent with abnormal left ventricular relaxation (grade 1 diastolic dysfunction). Left atrium: The atrium was mildly dilated. Right ventricle: The cavity size was mildly dilated. Wall thickness was normal. Right atrium: The atrium was mildly dilated. Pulmonary arteries: Systolic pressure was mildly increased. PA peak pressure: 36 mm Hg (S).  Cardiologist is Glenetta Hew, MD   Neuro/Psych PSYCHIATRIC DISORDERS Anxiety Depression Bipolar Disorder PTSD (post-traumatic stress disorder)negative neurological ROS     GI/Hepatic negative GI ROS, Neg liver ROS,   Endo/Other  negative endocrine ROS  Renal/GU negative Renal ROS     Musculoskeletal negative musculoskeletal ROS (+)   Abdominal   Peds  Hematology HLD   Anesthesia Other Findings CRITICAL LIMB ISCHEMIA LEFT LOWER EXTREMITY  Reproductive/Obstetrics                            Anesthesia Physical Anesthesia Plan  ASA:  IV  Anesthesia Plan: General   Post-op Pain Management:    Induction: Intravenous  PONV Risk Score and Plan: 1 and Ondansetron, Dexamethasone and Treatment may vary due to age or medical condition  Airway Management Planned: Oral ETT  Additional Equipment:   Intra-op Plan:   Post-operative Plan: Possible Post-op intubation/ventilation and Extubation in OR  Informed Consent: I have reviewed the patients History and Physical, chart, labs and discussed the procedure including the risks, benefits and alternatives for the proposed anesthesia with the patient or authorized representative who has indicated his/her understanding and acceptance.   Dental advisory given  Plan Discussed with: CRNA  Anesthesia Plan Comments:        Anesthesia Quick Evaluation

## 2017-11-17 NOTE — Progress Notes (Signed)
    Patient with PT doppler signals B Left groin soft without hematoma Heart RRR Lungs non labored breathing  S/P PROCEDURE: External iliac, common femoral and profundus femoral endarterectomy with Dacron patch angioplasty and reimplantation of native superficial femoral artery into Dacron patch  Stable disposition with patent femoral and profunda artery.

## 2017-11-17 NOTE — Plan of Care (Signed)
  Problem: Health Behavior/Discharge Planning: Goal: Ability to manage health-related needs will improve Outcome: Progressing   Problem: Clinical Measurements: Goal: Will remain free from infection Outcome: Progressing   Problem: Activity: Goal: Risk for activity intolerance will decrease Outcome: Progressing   Problem: Skin Integrity: Goal: Risk for impaired skin integrity will decrease Outcome: Progressing   Problem: Education: Goal: Knowledge of General Education information will improve Outcome: Completed/Met

## 2017-11-17 NOTE — Op Note (Signed)
OPERATIVE REPORT  DATE OF SURGERY: 11/17/2017  PATIENT: John Ball, 77 y.o. male MRN: [id number]  DOB: March 20, 1941  PRE-OPERATIVE DIAGNOSIS: Critical limb ischemia left foot  POST-OPERATIVE DIAGNOSIS:  Same  PROCEDURE: External iliac, common femoral and profundus femoral endarterectomy with Dacron patch angioplasty and reimplantation of native superficial femoral artery into Dacron patch  SURGEON:  Curt Jews, M.D.  PHYSICIAN ASSISTANT: Dr. Shanon Rosser, Laurence Slate, PA-C  ANESTHESIA: General  EBL: 300 ml  Total I/O In: 1500 [I.V.:1500] Out: 5 [Urine:190; Blood:300]  BLOOD ADMINISTERED: None  DRAINS: None  SPECIMEN: None  COUNTS CORRECT:  YES  PLAN OF CARE: PACU  PATIENT DISPOSITION:  PACU - hemodynamically stable  PROCEDURE DETAILS: The patient was taken to the operating placed in supine position where the area of the left groin was prepped and draped in the usual sterile fashion.  Incision was made over the common femoral artery and carried down to isolate the artery.  The artery was completely calcified and there was no pulse present.  The external iliac was exposed under the inguinal ligament and had minimal pulse at the inguinal ligament but much higher it did have a pulsation.  There was extensive calcification at this upper level as well.  Dissection was continued down to the superficial femoral artery which was calcified at its origin but somewhat softer distal to this.  There was extensive calcification at the origin of the profundus femoris artery and its branches.  The profundus femoris artery was dissected out for approximately 3 to 4 cm to multiple branches.  These had less plaque present.  The patient was given 9000 units of intravenous heparin.  Profundal branches in the superficial femoral artery were controlled with Vesseloops.  A Henley clamp was used to occlude the external iliac artery up under the inguinal ligament.  There was still some pulsation  present.  The common femoral artery was opened and a 4 Fogarty catheter was positioned into the external iliac artery and inflated with a stopcock to control proximally.  An extensive endarterectomy was undertaken from the external iliac common femoral and extending down to the origin of the superficial femoral artery.  The origin of the superficial femoral artery was transected.  Endarterectomy was carried down onto the profundus femoris artery for multiple branches.  The remaining atheromatous removed the debris was removed from the endarterectomy plane.  A Dacron Finesse patch was brought onto the field and was sewn as a very long patch angioplasty extending from under the inguinal ligament down to the distal branches of the profunda.  This anastomosis tended tested and found to be adequate.  The proximal portion of the superficial femoral artery was extensively calcified.  The artery was opened longitudinally and this portion of the artery was transected and removed.  The superficial femoral artery was slightly spatulated and a ellipse of Dacron graft was removed.  The superficial femoral artery was sewn end-to-side to the patch with a running 5-0 Prolene suture.  Prior to completion of the closure the usual flushing maneuvers were undertaken.  The anastomosis was completed and excellent Doppler flow was noted through the superficial femoral artery and the profundus branches.  The patient was given 50 mg of protamine to reverse the heparin.  The wounds were irrigated with saline.  Hemostasis electrocautery.  Wounds were closed with 2-0 Vicryl in several layers and the deep tissue and the skin was closed with 3-0 subcuticular Vicryl suture.  Sterile dressing was applied and the patient  was transferred to the recovery room in stable condition   John Ball, M.D., [facility] 11/17/2017 10:47 AM

## 2017-11-17 NOTE — Anesthesia Procedure Notes (Signed)
Procedure Name: Intubation Date/Time: 11/17/2017 7:43 AM Performed by: Genelle Bal, CRNA Pre-anesthesia Checklist: Patient identified, Emergency Drugs available, Suction available and Patient being monitored Patient Re-evaluated:Patient Re-evaluated prior to induction Oxygen Delivery Method: Circle system utilized Preoxygenation: Pre-oxygenation with 100% oxygen Induction Type: IV induction Ventilation: Mask ventilation without difficulty Laryngoscope Size: Miller and 2 Grade View: Grade I Tube type: Oral Tube size: 7.5 mm Number of attempts: 1 Airway Equipment and Method: Stylet and Oral airway Placement Confirmation: ETT inserted through vocal cords under direct vision,  positive ETCO2 and breath sounds checked- equal and bilateral Secured at: 22 cm Tube secured with: Tape Dental Injury: Teeth and Oropharynx as per pre-operative assessment

## 2017-11-17 NOTE — Progress Notes (Signed)
Pt received from PACU. VSS. L DP absent. R DP pulse faint. L groin level 0. CHG complete. Telemetry applied. Pt and family oriented to unit and room. Call light in reach. Will continue to monitor.  [name], RN

## 2017-11-17 NOTE — Anesthesia Postprocedure Evaluation (Signed)
Anesthesia Post Note  Patient: John Ball  Procedure(s) Performed: LEFT Femoral Endarterectomy with Patch Angioplasty (Left Leg Upper)     Patient location during evaluation: PACU Anesthesia Type: General Level of consciousness: awake and alert Pain management: pain level controlled Vital Signs Assessment: post-procedure vital signs reviewed and stable Respiratory status: spontaneous breathing, nonlabored ventilation, respiratory function stable and patient connected to nasal cannula oxygen Cardiovascular status: blood pressure returned to baseline and stable Postop Assessment: no apparent nausea or vomiting Anesthetic complications: no    Last Vitals:  Vitals:   11/17/17 1300 11/17/17 1323  BP:  (!) 150/89  Pulse: 89 90  Resp: 16 15  Temp: (!) 36.3 C 37 C  SpO2: 98% 92%    Last Pain:  Vitals:   11/17/17 1323  TempSrc: Oral  PainSc:                  Ryan P Ellender

## 2017-11-18 ENCOUNTER — Encounter (HOSPITAL_COMMUNITY): Payer: Self-pay | Admitting: Vascular Surgery

## 2017-11-18 ENCOUNTER — Telehealth: Payer: Self-pay | Admitting: Vascular Surgery

## 2017-11-18 LAB — BASIC METABOLIC PANEL
Anion gap: 9 (ref 5–15)
BUN: 18 mg/dL (ref 6–20)
CALCIUM: 8.4 mg/dL — AB (ref 8.9–10.3)
CO2: 28 mmol/L (ref 22–32)
Chloride: 103 mmol/L (ref 101–111)
Creatinine, Ser: 1.06 mg/dL (ref 0.61–1.24)
GFR calc non Af Amer: >60 mL/min (ref >60)
GLUCOSE: 106 mg/dL — AB (ref 65–99)
Potassium: 4 mmol/L (ref 3.5–5.1)
Sodium: 140 mmol/L (ref 135–145)

## 2017-11-18 LAB — CBC
HEMATOCRIT: 35.9 % — AB (ref 39.0–52.0)
Hemoglobin: 11.5 g/dL — ABNORMAL LOW (ref 13.0–17.0)
MCH: 33.3 pg (ref 26.0–34.0)
MCHC: 32 g/dL (ref 30.0–36.0)
MCV: 104.1 fL — ABNORMAL HIGH (ref 78.0–100.0)
Platelets: 130 10*3/uL — ABNORMAL LOW (ref 150–400)
RBC: 3.45 MIL/uL — ABNORMAL LOW (ref 4.22–5.81)
RDW: 14.2 % (ref 11.5–15.5)
WBC: 7.6 10*3/uL (ref 4.0–10.5)

## 2017-11-18 MED ORDER — CLONAZEPAM 1 MG PO TABS
1.0000 mg | ORAL_TABLET | Freq: Once | ORAL | Status: AC
  Administered 2017-11-18: 1 mg via ORAL
  Filled 2017-11-18: qty 1

## 2017-11-18 MED ORDER — OXYCODONE HCL 5 MG PO TABS: 5.0000 mg | ORAL_TABLET | Freq: Four times a day (QID) | ORAL | 0 refills | Status: DC | PRN

## 2017-11-18 NOTE — Discharge Instructions (Signed)
 [facility] [place]  Discharge instructions  Lower Extremity Bypass Surgery  Please refer to the following instruction for your post-procedure care. Your surgeon or physician assistant will discuss any changes with you.  Activity  You are encouraged to walk as much as you can. You can slowly return to normal activities during the month after your surgery. Avoid strenuous activity and heavy lifting until your doctor tells you it's OK. Avoid activities such as vacuuming or swinging a golf club. Do not drive until your doctor give the OK and you are no longer taking prescription pain medications. It is also normal to have difficulty with sleep habits, eating and bowel movement after surgery. These will go away with time.  Bathing/Showering  You may shower after you go home. Do not soak in a bathtub, hot tub, or swim until the incision heals completely.  Incision Care  Clean your incision with mild soap and water. Shower every day. Pat the area dry with a clean towel. You do not need a bandage unless otherwise instructed. Do not apply any ointments or creams to your incision. If you have open wounds you will be instructed how to care for them or a visiting nurse may be arranged for you. If you have staples or sutures along your incision they will be removed at your post-op appointment. You may have skin glue on your incision. Do not peel it off. It will come off on its own in about one week. If you have a great deal of moisture in your groin, use a gauze help keep this area dry.  Diet  Resume your normal diet. There are no special food restrictions following this procedure. A low fat/ low cholesterol diet is recommended for all patients with vascular disease. In order to heal from your surgery, it is CRITICAL to get adequate nutrition. Your body requires vitamins, minerals, and protein. Vegetables are the best source of vitamins and minerals. Vegetables also provide the  perfect balance of protein. Processed food has little nutritional value, so try to avoid this.  Medications  Resume taking all your medications unless your doctor or nurse practitioner tells you not to. If your incision is causing pain, you may take over-the-counter pain relievers such as acetaminophen (Tylenol). If you were prescribed a stronger pain medication, please aware these medication can cause nausea and constipation. Prevent nausea by taking the medication with a snack or meal. Avoid constipation by drinking plenty of fluids and eating foods with high amount of fiber, such as fruits, vegetables, and grains. Take Colase 100 mg (an over-the-counter stool softener) twice a day as needed for constipation. Do not take Tylenol if you are taking prescription pain medications.  Follow Up  Our office will schedule a follow up appointment 2-3 weeks following discharge.  Please call us immediately for any of the following conditions  Severe or worsening pain in your legs or feet while at rest or while walking Increase pain, redness, warmth, or drainage (pus) from your incision site(s) Fever of 101 degree or higher The swelling in your leg with the bypass suddenly worsens and becomes more painful than when you were in the hospital If you have been instructed to feel your graft pulse then you should do so every day. If you can no longer feel this pulse, call the office immediately. Not all patients are given this instruction.  Leg swelling is common after leg bypass surgery.  The swelling should improve over a few months   following surgery. To improve the swelling, you may elevate your legs above the level of your heart while you are sitting or resting. Your surgeon or physician assistant may ask you to apply an ACE wrap or wear compression (TED) stockings to help to reduce swelling.  Reduce your risk of vascular disease  Stop smoking. If you would like help call [facility] at 1-800-QUIT-NOW  (1-800-784-8669) or [place] at 336-586-4000.  Manage your cholesterol Maintain a desired weight Control your diabetes weight Control your diabetes Keep your blood pressure down  If you have any questions, please call the office at [phone]   

## 2017-11-18 NOTE — Telephone Encounter (Signed)
Sched appt 12/05/17 at 1:30. Lm on cell# to inform pt of appt.

## 2017-11-18 NOTE — Care Management Note (Signed)
Case Management Note Marvetta Gibbons RN, BSN Unit 4E-Case Manager [phone]  Patient Details  Name: John Ball MRN: [id number] Date of Birth: [birth year]  Subjective/Objective:   Pt admitted s/p External iliac, common femoral and profundus femoral endarterectomy                Action/Plan: PTA pt lived at home- notified by tiffany with [name] pt has pre-op referral for [name] services- they will f/u post discharge for any needs.   Expected Discharge Date:  11/18/17               Expected Discharge Plan:  [name]  In-House Referral:  NA  Discharge planning Services  CM Consult  Post Acute Care Choice:  Home Health Choice offered to:     DME Arranged:    DME Agency:     HH Arranged:    [name] Agency:  [name]  Status of Service:  Completed, signed off  If discussed at [place] of Stay Meetings, dates discussed:    Discharge Disposition: home/home health   Additional Comments:  11/18/17- 1100- Julaine Zimny RN, CM- pt discharged home this am- notified Tiffany with [name] for f/u on [name] needs.   Dawayne Patricia, RN 11/18/2017, 12:30 PM

## 2017-11-18 NOTE — Progress Notes (Addendum)
[facility] [place]  Subjective  - Doing well, ambulating and tolerating PO's.   Objective 102/61 76 98.4 F (36.9 C) (Oral) 18 92%  Intake/Output Summary (Last 24 hours) at 11/18/2017 0719 Last data filed at 11/18/2017 0610 Gross per 24 hour  Intake 2912.5 ml  Output 2225 ml  Net 687.5 ml   Doppler signals PT B Left medial MT head wound without change.  No drainage. Heart RRR Lungs non labored breathing Gen NAD, mobility at baseline   Assessment/Planning: POD # 1   PROCEDURE: External iliac, common femoral and profundus femoral endarterectomy with Dacron patch angioplasty and reimplantation of native superficial femoral artery into Dacron patch   Disposition stable for discharge home F/U with Dr. Donnetta Hutching 2-3 weeks No equipment needs no HH f/u needed.  Roxy Horseman 11/18/2017 7:19 AM --  Laboratory Lab Results: Recent Labs    11/17/17 1436 11/18/17 0344  WBC 7.5 7.6  HGB 12.8* 11.5*  HCT 39.8 35.9*  PLT 139* 130*   BMET Recent Labs    11/17/17 1436 11/18/17 0344  NA  --  140  K  --  4.0  CL  --  103  CO2  --  28  GLUCOSE  --  106*  BUN  --  18  CREATININE 1.02 1.06  CALCIUM  --  8.4*    COAG Lab Results  Component Value Date   INR 1.05 11/12/2017   INR 0.95 07/07/2014   INR 1.01 01/07/2014   No results found for: PTT  I have examined the patient, reviewed and agree with above.  Looks great this morning.  Walking without difficulty.  Reports resolution of rest pain in his foot.  Asking to be discharged home.  Feel this is appropriate.  We will follow-up in the office in several weeks  Curt Jews, MD 11/18/2017 7:25 AM

## 2017-11-18 NOTE — Progress Notes (Signed)
D/c instructions given to pt and wife, including incision care. Pt foley removed this AM at 0610, pt only voided 100cc since then, explained to pt and wide that they need to stay until he can void another 100cc. They refused. Educated them on urine retention s/s. Pt removed his own IV, but tip was intact. Telemetry removed. Medications stored in pharmacy returned. Pt and family refused escort to exit by nursing staff.  [name], RN

## 2017-11-18 NOTE — Telephone Encounter (Signed)
-----   Message from Penni Homans, RN sent at 11/18/2017  9:06 AM EDT ----- Regarding: Appointment   ----- Message ----- From: Ulyses Amor, PA-C Sent: 11/18/2017   7:13 AM To: Vvs Charge Pool  S/P femoral endarterectomy left LE  F/u with Dr. Donnetta Hutching in 2-3 weeks

## 2017-11-18 NOTE — Evaluation (Signed)
Occupational Therapy Evaluation Patient Details Name: John Ball MRN: [id number] DOB: 20-Feb-1941 Today's Date: 11/18/2017    History of Present Illness 77 y.o. Male presenting with LLE ischemia. Underwent femoral and profundus femoral endarterectomy on 5/13.     Clinical Impression   PTA, pt was living with his wife and was independent. Pt currently performing ADLs at supervision level. Educating pt on compensatory techniques for LB ADLs to optimize independence and safety and decrease pain at LLE. Pt planning for dc this AM. Providing all education and answered pt questions. Recommend dc home once medically stable. All acute OT needs met and will sign off.    Follow Up Recommendations  No OT follow up;Supervision - Intermittent    Equipment Recommendations  None recommended by OT    Recommendations for Other Services       Precautions / Restrictions Precautions Precautions: None Restrictions Weight Bearing Restrictions: No      Mobility Bed Mobility               General bed mobility comments: OOB getting dressing upon arrival  Transfers Overall transfer level: Modified independent Equipment used: None             General transfer comment: Increased time    Balance Overall balance assessment: Mild deficits observed, not formally tested                                         ADL either performed or assessed with clinical judgement   ADL Overall ADL's : Needs assistance/impaired                                       General ADL Comments: Pt performing ADLs at supervision level. Discussed compensatory techniques for LB dressing to optimzie safety and independence and decrease pain.      Vision Baseline Vision/History: Wears glasses Wears Glasses: At all times Patient Visual Report: No change from baseline       Perception     Praxis      Pertinent Vitals/Pain Pain Assessment: No/denies pain     Hand  Dominance Right   Extremity/Trunk Assessment Upper Extremity Assessment Upper Extremity Assessment: Overall WFL for tasks assessed   Lower Extremity Assessment Lower Extremity Assessment: Defer to PT evaluation   Cervical / Trunk Assessment Cervical / Trunk Assessment: Normal   Communication Communication Communication: HOH   Cognition Arousal/Alertness: Awake/alert Behavior During Therapy: WFL for tasks assessed/performed Overall Cognitive Status: Within Functional Limits for tasks assessed                                     General Comments  SpO2 89% on RA    Exercises     Shoulder Instructions      Home Living Family/patient expects to be discharged to:: Private residence Living Arrangements: Spouse/significant other Available Help at Discharge: Family;Available 24 hours/day Type of Home: House Home Access: Ramped entrance     Home Layout: One level     Bathroom Shower/Tub: Occupational psychologist: Handicapped height Bathroom Accessibility: Yes How Accessible: Accessible via wheelchair Home Equipment: Independence - 2 wheels;Shower seat;Electric scooter   Additional Comments: Wears 2L O2      Prior  Functioning/Environment Level of Independence: Independent        Comments: ADLs, IADLs, mows the lawn with ride mower. Uses a electric scooter for outside home.         OT Problem List: Decreased range of motion;Impaired balance (sitting and/or standing)      OT Treatment/Interventions:      OT Goals(Current goals can be found in the care plan section) Acute Rehab OT Goals Patient Stated Goal: Go home today OT Goal Formulation: All assessment and education complete, DC therapy  OT Frequency:     Barriers to D/C:            Co-evaluation              AM-PAC PT "6 Clicks" Daily Activity     Outcome Measure Help from another person eating meals?: None Help from another person taking care of personal grooming?: None Help  from another person toileting, which includes using toliet, bedpan, or urinal?: A Little Help from another person bathing (including washing, rinsing, drying)?: A Little Help from another person to put on and taking off regular upper body clothing?: None Help from another person to put on and taking off regular lower body clothing?: A Little 6 Click Score: 21   End of Session Nurse Communication: Mobility status  Activity Tolerance: Patient tolerated treatment well Patient left: in chair;with call bell/phone within reach  OT Visit Diagnosis: Unsteadiness on feet (R26.81);Other abnormalities of gait and mobility (R26.89);Muscle weakness (generalized) (M62.81)                Time: 9390-3009 OT Time Calculation (min): 9 min Charges:  OT General Charges $OT Visit: 1 Visit OT Evaluation $OT Eval Low Complexity: 1 Low G-Codes:     Nichelle Renwick MSOT, OTR/L Acute Rehab Pager: [phone] Office: [place] 11/18/2017, 8:47 AM

## 2017-11-18 NOTE — Evaluation (Signed)
Physical Therapy Evaluation Patient Details Name: John Ball MRN: [id number] DOB: June 01, 1941 Today's Date: 11/18/2017   History of Present Illness  Pt is a 77 y.o. male presenting 11/17/17 with LLE ischemia; now s/p femoral and profundus femoral endarterectomy. PMH includes HTN, CHF, COPD, CAD (s/p CABG 2004), squamous cell lung CA.     Clinical Impression  Patient evaluated by Physical Therapy with no further acute PT needs identified. PTA, pt indep with ambulation, uses electric scooter outside home; lives with wife available for 24/7 support. Today, pt indep with transfers and amb throughout room. All education has been completed and the patient has no further questions. Acute PT is signing off. Thank you for this referral.    Follow Up Recommendations No PT follow up;Supervision - Intermittent    Equipment Recommendations  None recommended by PT    Recommendations for Other Services       Precautions / Restrictions Precautions Precautions: None Restrictions Weight Bearing Restrictions: No      Mobility  Bed Mobility               General bed mobility comments: Standing in room upon arrival  Transfers Overall transfer level: Independent Equipment used: None             General transfer comment: Increased time  Ambulation/Gait Ambulation/Gait assistance: Independent Ambulation Distance (Feet): 100 Feet Assistive device: None Gait Pattern/deviations: Step-through pattern;Decreased stride length   Gait velocity interpretation: <1.8 ft/sec, indicate of risk for recurrent falls    Stairs            Wheelchair Mobility    Modified Rankin (Stroke Patients Only)       Balance Overall balance assessment: Mild deficits observed, not formally tested                                           Pertinent Vitals/Pain Pain Assessment: No/denies pain    Home Living Family/patient expects to be discharged to:: Private  residence Living Arrangements: Spouse/significant other Available Help at Discharge: Family;Available 24 hours/day Type of Home: House Home Access: Level entry     Home Layout: One level Home Equipment: Walker - 2 wheels;Shower seat;Electric scooter Additional Comments: Wears 2L O2    Prior Function Level of Independence: Independent         Comments: ADLs, IADLs, mows the lawn with ride mower. Uses a electric scooter for outside home.      Hand Dominance   Dominant Hand: Right    Extremity/Trunk Assessment   Upper Extremity Assessment Upper Extremity Assessment: Overall WFL for tasks assessed    Lower Extremity Assessment Lower Extremity Assessment: Overall WFL for tasks assessed    Cervical / Trunk Assessment Cervical / Trunk Assessment: Kyphotic  Communication   Communication: HOH  Cognition Arousal/Alertness: Awake/alert Behavior During Therapy: WFL for tasks assessed/performed Overall Cognitive Status: Within Functional Limits for tasks assessed                                        General Comments General comments (skin integrity, edema, etc.): SpO2 89% on RA    Exercises     Assessment/Plan    PT Assessment Patent does not need any further PT services  PT Problem List  PT Treatment Interventions      PT Goals (Current goals can be found in the Care Plan section)  Acute Rehab PT Goals Patient Stated Goal: Go home today PT Goal Formulation: All assessment and education complete, DC therapy    Frequency     Barriers to discharge        Co-evaluation               AM-PAC PT "6 Clicks" Daily Activity  Outcome Measure Difficulty turning over in bed (including adjusting bedclothes, sheets and blankets)?: None Difficulty moving from lying on back to sitting on the side of the bed? : None Difficulty sitting down on and standing up from a chair with arms (e.g., wheelchair, bedside commode, etc,.)?: None Help  needed moving to and from a bed to chair (including a wheelchair)?: None Help needed walking in hospital room?: None Help needed climbing 3-5 steps with a railing? : A Little 6 Click Score: 23    End of Session   Activity Tolerance: Patient tolerated treatment well Patient left: with family/visitor present;Other (comment)(preparing to d/c with wife) Nurse Communication: Mobility status PT Visit Diagnosis: Other abnormalities of gait and mobility (R26.89)    Time: 8144-8185 PT Time Calculation (min) (ACUTE ONLY): 9 min   Charges:   PT Evaluation $PT Eval Moderate Complexity: 1 Mod     PT G Codes:       Mabeline Caras, PT, DPT Acute Rehab Services  Pager: [place] 11/18/2017, 9:26 AM

## 2017-11-19 NOTE — Discharge Summary (Signed)
[facility] Discharge Summary   Patient ID:  John Ball MRN: [id number] DOB/AGE: [id number] y.o.  Admit date: 11/17/2017 Discharge date: 11/18/2017 Date of Surgery: 11/17/2017 Surgeon: Surgeon(s): Early, Arvilla Meres, MD Angelia Mould, MD  Admission Diagnosis: CRITICAL LIMB ISCHEMIA LEFT LOWER EXTREMITY  Discharge Diagnoses:  CRITICAL LIMB ISCHEMIA LEFT LOWER EXTREMITY  Secondary Diagnoses: Past Medical History:  Diagnosis Date  . Anxiety   . Arthritis   . Complication of anesthesia    hx prolonged post op oxygen dependent/  hx hallucinations for 2 day post op lobectomy 2012  . COPD, severe ([place])   . Depression   . Essential hypertension   . Hearing loss of both ears    does wear his hearing aids  . History of arterial bypass of lower extremity    RIGHT FEM-POP  . History of CHF (congestive heart failure)   . History of DVT of lower extremity   . History of panic attacks   . Hyperlipidemia with target LDL less than 70   . Left main coronary artery disease 2004   a) Severe LM CAD 2004 -->s/p  cabg x4 (LIMA-LAD, free RIMA-RI, SVG-OM 2, SVG-RPDA); b) 06/2014: Abnormal Myoview --> c)cardiac Cath Jan 2016: pLM 70%, pLAD 40-50%- patent LIMA-LAD.  RI -competitive flow noted from RIMA-RI graft, circumflex, normal caliber with moderate OM1.  OM 2 occluded.  SVG-OM 2 patent. RCA CTO after large RV M, faint R-R and LAD septal-PDA collaterals =--> new  . Neuropathy, peripheral   . 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 Nocturia   . OSA (obstructive sleep apnea)    uses O2  via [place]  --  intolerate to CPAP  . Peripheral arterial occlusive disease ([place])    2011   a) Gore-Tex graft right AK popA-BK popA --> b) 2012: thrombosed graft --> thrombectomy with dacron patch = 1 V runnof via Peroneal; c) 01/2014: R femoropopliteal bypass with left femoral vein;; followed by dr Early--  per last dopplers 06-28-2014 no change right graft,  50-74% stenosis common  and left mid superficial femoral artery  . PTSD (post-traumatic stress disorder)    anxiety attack's---  [place] Vet  . Recurrent productive cough    SMOKER'S COUGH  . 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  Medical management  . Smoker unmotivated to quit    Reportedly he came close to having a nervous breakdown when he last tried to quit  . 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 Wears glasses     Procedure(s): LEFT Femoral Endarterectomy with Patch Angioplasty  Discharged Condition: good  HPI:  77 y/o male was seen in our office 11/11/2017 left LE claudication symptoms he is able to ambulate about 75 feet at a time.  He developed rest pain in his left GT and ulcer  over the first MTP.ABI R 0.40 L0.26 Based on the angiogram performed by Dr. Donzetta Matters   subtotal occlusion of his common femoral artery and stenosis at the origin of the deep femoral artery.  Long segment occlusion of the superficial femoral artery with reconstitution of a diseased popliteal artery above the knee and at the knee.  Below-knee popliteal artery is patent.  There is diffuse disease at the origin of all trifurcation vessels with two-vessel runoff with the posterior tibial being the dominant runoff.   It was planned that Dr. Donnetta Hutching will take him to the OR for common femoral and profundus endarterectomy and  possible left femoral to below-knee popliteal  bypass.          Hospital Course:  FERD HORRIGAN is a 77 y.o. male is S/P Left Procedure(s): LEFT Femoral Endarterectomy with Patch Angioplasty   PROCEDURE: External iliac, common femoral and profundus femoral endarterectomy with Dacron patch angioplasty and reimplantation of native superficial femoral artery into Dacron patch  Post op day 1 he had doppler signals in the left PT.  He was ambulating well with good pain control.  Left groin incision without hematoma healing well. stable disposition for discharge home.   Significant Diagnostic Studies: CBC Lab Results  Component Value Date   WBC 7.6 11/18/2017   HGB 11.5 (L) 11/18/2017   HCT 35.9 (L) 11/18/2017   MCV 104.1 (H) 11/18/2017   PLT 130 (L) 11/18/2017    BMET    Component Value Date/Time   NA 140 11/18/2017 0344   NA 143 09/25/2017 1420   NA 139 05/02/2014 1156   K 4.0 11/18/2017 0344   K 4.1 05/02/2014 1156   CL 103 11/18/2017 0344   CO2 28 11/18/2017 0344   CO2 24 05/02/2014 1156   GLUCOSE 106 (H) 11/18/2017 0344   GLUCOSE 101 05/02/2014 1156   BUN 18 11/18/2017 0344   BUN 18 09/25/2017 1420   BUN 15.7 05/02/2014 1156   CREATININE 1.06 11/18/2017 0344   CREATININE 1.21 07/07/2014 1105   CREATININE 1.1 05/02/2014 1156   CALCIUM 8.4 (L) 11/18/2017 0344   CALCIUM 9.5 05/02/2014 1156   GFRNONAA >60 11/18/2017 0344   GFRAA >60 11/18/2017 0344   COAG Lab Results  Component Value Date   INR 1.05 11/12/2017   INR 0.95 07/07/2014   INR 1.01 01/07/2014     Disposition:  Discharge to :Home Discharge Instructions    Call MD for:  redness, tenderness, or signs of infection (pain, swelling, bleeding, redness, odor or green/yellow discharge around incision site)   Complete by:  As directed    Call MD for:  severe or increased pain, loss or decreased feeling  in affected limb(s)   Complete by:  As directed    Call MD for:  temperature >100.5   Complete by:  As directed    Resume previous diet    Complete by:  As directed      Allergies as of 11/18/2017      Reactions   Aripiprazole Other (See Comments)   Altered mental status   Aspirin Other (See Comments)   Avoids due to severe bruises   Crestor [rosuvastatin Calcium] Other (See Comments)   myalgia   Effexor [venlafaxine Hydrochloride] Itching   Lipitor [atorvastatin Calcium] Other (See Comments)   myalgia   Morphine And Related Itching   Zetia [ezetimibe] Other (See Comments)   myalgia   Zocor [simvastatin - High Dose] Other (See Comments)   myalgia  Medication List    TAKE these medications   acetaminophen 500 MG tablet Commonly known as:  TYLENOL Take 1,000 mg by mouth every 8 (eight) hours as needed.   ADVAIR DISKUS 500-50 MCG/DOSE Aepb Generic drug:  Fluticasone-Salmeterol Inhale 1 puff into the lungs 2 (two) times daily.   albuterol (5 MG/ML) 0.5% nebulizer solution Commonly known as:  PROVENTIL Take 2.5 mg by nebulization every 6 (six) hours as needed for wheezing or shortness of breath.   albuterol 108 (90 Base) MCG/ACT inhaler Commonly known as:  PROVENTIL HFA;VENTOLIN HFA Inhale 1 puff into the lungs every 6 (six) hours as needed for wheezing or shortness of breath.   AMBIEN CR 12.5 MG CR tablet Generic drug:  zolpidem Take 12.5 mg by mouth at bedtime.   clonazePAM 1 MG tablet Commonly known as:  KLONOPIN Take 1 tablet by mouth 2 (two) times daily.   DULoxetine 30 MG capsule Commonly known as:  CYMBALTA Take 30-60 mg by mouth See admin instructions. 60mg  (2 capsules) every morning and 30mg  (1 capsule) daily at evening   gabapentin 100 MG capsule Commonly known as:  NEURONTIN Take 100 mg by mouth every 8 (eight) hours.   nitroGLYCERIN 0.4 MG SL tablet Commonly known as:  NITROSTAT Place 1 tablet (0.4 mg total) under the tongue every 5 (five) minutes as needed for chest pain.   oxyCODONE 5 MG immediate release tablet Commonly known as:  Oxy IR/ROXICODONE Take 1 tablet (5 mg total) by  mouth every 6 (six) hours as needed for moderate pain.   OXYGEN Inhale 2 L into the lungs at bedtime. Uses O2 -- intolerant to CPAP   prazosin 1 MG capsule Commonly known as:  MINIPRESS Take 1 mg by mouth at bedtime.   QUEtiapine 300 MG 24 hr tablet Commonly known as:  SEROQUEL XR Take 300 mg by mouth at bedtime.   ramipril 2.5 MG capsule Commonly known as:  ALTACE Take 2.5 mg by mouth every morning.   SPIRIVA HANDIHALER 18 MCG inhalation capsule Generic drug:  tiotropium Place 18 mcg into inhaler and inhale daily.   traZODone 150 MG tablet Commonly known as:  DESYREL Take 0.5 tablets (75 mg total) by mouth at bedtime as needed for sleep. What changed:    how much to take  when to take this   Vitamin D3 1000 units Caps Take 1 capsule by mouth every morning.      Verbal and written Discharge instructions given to the patient. Wound care per Discharge AVS Follow-up Information    Early, Arvilla Meres, MD Follow up in 2 week(s).   Specialties:  Vascular Surgery, Cardiology Why:  office will call Contact information: [address] [place] [address] [phone]           Signed: Roxy Horseman 11/19/2017, 10:58 AM

## 2017-11-25 ENCOUNTER — Telehealth: Payer: Self-pay | Admitting: *Deleted

## 2017-11-25 NOTE — Telephone Encounter (Signed)
Call from wife that patient is having drainage from left groin. No redness or heat at site. Denies fever or chills, and changing dressing TID. Instructed to keep area clean and dry. Appointment with Vinnie Level for wound check on 11/27/17 and to call for any worsening condition.

## 2017-11-27 ENCOUNTER — Ambulatory Visit (INDEPENDENT_AMBULATORY_CARE_PROVIDER_SITE_OTHER): Payer: Self-pay | Admitting: Family

## 2017-11-27 ENCOUNTER — Encounter: Payer: Self-pay | Admitting: Family

## 2017-11-27 ENCOUNTER — Other Ambulatory Visit: Payer: Self-pay

## 2017-11-27 VITALS — BP 105/60 | HR 64 | Temp 98.3°F | Resp 20 | Ht 72.0 in | Wt 207.0 lb

## 2017-11-27 DIAGNOSIS — I779 Disorder of arteries and arterioles, unspecified: Secondary | ICD-10-CM

## 2017-11-27 DIAGNOSIS — Z5189 Encounter for other specified aftercare: Secondary | ICD-10-CM

## 2017-11-27 MED ORDER — CEPHALEXIN 500 MG PO CAPS: 500.0000 mg | ORAL_CAPSULE | Freq: Three times a day (TID) | ORAL | 0 refills | Status: DC

## 2017-11-27 NOTE — Patient Instructions (Signed)
  To decrease swelling in your foot and leg: Elevate feet above slightly bent knees, feet above heart, overnight and 3-4 times per day for 20 minutes.

## 2017-11-27 NOTE — Progress Notes (Signed)
Postoperative Visit   History of Present Illness  John Ball is a 77 y.o. year old male who is s/p left external iliac, common femoral and profundus femoral endarterectomy with Dacron patch angioplasty and reimplantation of native superficial femoral artery into Dacron patch on 11-17-17 by Dr. Donnetta Hutching for critical left lower limb ischemia.  He initially had a right femoral to above-knee popliteal bypass in April 2010. This was followed by right above-knee to below-knee popliteal bypass in August of 2011. He presented with occlusion of this and underwent thrombectomy and revision of his femoral-popliteal bypass in April of 2012.  He is also s/p right common femoral and profundus femoris artery thrombectomy and endarterectomy; right femoral to posterior tibial bypass with saphenous vein harvested from left leg on 01/10/14. He has a history of DVT.   He returns today after call from wife on 11-25-17 that patient is having drainage from left groin. No redness or heat at site. Denies fever or chills, and changing dressing TID. Wife states drainage is watery, yellow tinged, and copious amounts. Pt states he had a chills this morning, denies feeling feverish. He c/o pain in his left foot only, no groin pain.  He apparently has net been elevating his left foot adequately, and has 2+ non pitting edema in left foot and lower leg.    The patient is able to complete their activities of daily living.   He has been on supplemental O2 since 2012, when right lung cancer was diagnosed, and he had a right lobectomy. .     Diabetic: No Tobacco use smoker (1/2ppd, started at age 23 yrs)  Pt meds include: Statin :No, statins caused itching ASA: No, record indicates that ASA caused GI bleed, but wife states this is not the case, that he bruised easily taking 81 mg ASA daily Other anticoagulants/antiplatelets: no     For VQI Use Only  PRE-ADM LIVING: Home  AMB STATUS: Ambulatory with  Assistance  Physical Examination  Vitals:   11/27/17 1012  BP: 105/60  Pulse: 64  Resp: 20  Temp: 98.3 F (36.8 C)  TempSrc: Oral  SpO2: 97%  Weight: 207 lb (93.9 kg)  Height: 6' (1.829 m)   Body mass index is 28.07 kg/m.    Past Medical History:  Diagnosis Date  . Anxiety   . Arthritis   . Complication of anesthesia    hx prolonged post op oxygen dependent/  hx hallucinations for 2 day post op lobectomy 2012  . COPD, severe ([place])   . Depression   . Essential hypertension   . Hearing loss of both ears    does wear his hearing aids  . History of arterial bypass of lower extremity    RIGHT FEM-POP  . History of CHF (congestive heart failure)   . History of DVT of lower extremity   . History of panic attacks   . Hyperlipidemia with target LDL less than 70   . Left main coronary artery disease 2004   a) Severe LM CAD 2004 -->s/p  cabg x4 (LIMA-LAD, free RIMA-RI, SVG-OM 2, SVG-RPDA); b) 06/2014: Abnormal Myoview --> c)cardiac Cath Jan 2016: pLM 70%, pLAD 40-50%- patent LIMA-LAD.  RI -competitive flow noted from RIMA-RI graft, circumflex, normal caliber with moderate OM1.  OM 2 occluded.  SVG-OM 2 patent. RCA CTO after large RV M, faint R-R and LAD septal-PDA collaterals =--> new  . Neuropathy, peripheral   . No natural teeth    does not wear his  dentures  . Nocturia   . OSA (obstructive sleep apnea)    uses O2  via [place]  --  intolerate to CPAP  . Peripheral arterial occlusive disease ([place])    2011   a) Gore-Tex graft right AK popA-BK popA --> b) 2012: thrombosed graft --> thrombectomy with dacron patch = 1 V runnof via Peroneal; c) 01/2014: R femoropopliteal bypass with left femoral vein;; followed by dr Early--  per last dopplers 06-28-2014 no change right graft,  50-74% stenosis common and left mid superficial femoral artery  . PTSD (post-traumatic stress disorder)    anxiety attack's---  [place] Vet  . Recurrent productive cough    SMOKER'S COUGH  . 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  Medical management  . Smoker unmotivated to quit    Reportedly he came close to having a nervous breakdown when he last tried to quit  . 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 Wears glasses     Past Surgical History:  Procedure Laterality Date  . ABDOMINAL AORTOGRAM N/A 11/12/2017   Procedure: ABDOMINAL AORTOGRAM;  Surgeon: Waynetta Sandy, MD;  Location: [facility];  Service: Cardiovascular;  Laterality: N/A;  . CARDIAC CATHETERIZATION  07-13-2002   Ischemia RCA region on Cardiolite// 60-70% ostial left main, 50% midCx, 60-70% mid RI,  70% mRCA, 90% JeNu and crux 75% RCA ,  95% PLA//  severe LM and 3Vessel CAD, perserved LV, ef 60%  . CARDIOVASCULAR STRESS TEST  last one  06-17-2014   dr  Ellyn Hack   [place] Adam study with no exercise; Intermediate Risk Study;   moderate size and intensity, partially reversible inferior septal defect consistent with prior infarct and mild to moderate peri-infarct ischemia;  mild hypokinesis of the inferior septal wall,  normal LV function, ef 56%  . COLONOSCOPY  2008    WNL  . CORONARY ARTERY BYPASS GRAFT  07/27/2002   LIMA-LAD, freeRIMA-RI,SVG-OM2, SVG-rPDA; Dr. Roxan Hockey  . CYSTOSCOPY W/ URETERAL STENT PLACEMENT N/A 02/21/2015   Procedure: BILATERAL RETROGRADE PYELOGRAM;  Surgeon: Festus Aloe, MD;  [facility];  Service: Urology;   Laterality: N/A;  . ENDARTERECTOMY FEMORAL Left 11/17/2017   Procedure: LEFT Femoral Endarterectomy with Patch Angioplasty;  Surgeon: Rosetta Posner, MD;  Location: [facility];  Service: Vascular;  Laterality: Left;  . EYE SURGERY Left Nov 2013  . FEMORAL ARTERY - POPLITEAL ARTERY BYPASS GRAFT Right 10-24-2008   dr early   w/ GoreTex graft  . FEMORAL-TIBIAL BYPASS GRAFT Right 01/10/2014   Procedure: Right Femoral to Posterior Tibial Bypass Graft using Left Leg Vein, Thrombectomy Right Common Femoral and Profunda of Leg . ;  Surgeon: Rosetta Posner, MD;  Location: [facility];  Service: Vascular;  Laterality: Right;  . HYDROCELE EXCISION Left   . LEFT ANKLE SURGERY  1990's  . LEFT HEART CATHETERIZATION  WITH CORONARY/GRAFT ANGIOGRAM N/A 07/12/2014   Procedure: LEFT HEART CATHETERIZATION WITH Beatrix Fetters;  Surgeon: Troy Sine, MD;  Location: [facility] C[facility] LAB;  Service: Cardiovascular;  Laterality: N/A;  pLM 70%, pLAD 40-50%- patent LIMA-LAD.  RI -competitive flow noted from fRIMA-RI graft, circumflex, normal caliber with moderate OM1.  OM 2 occluded.  SVG-OM 2 patent. RCA CTO after large RVM, faint R-R and LAD septal-PDA collateral  . LOWER EXTREMITY ANGIOGRAPHY Bilateral 11/12/2017   Procedure: LOWER EXTREMITY ANGIOGRAPHY - Left Lower;  Surgeon: Waynetta Sandy, MD;  Location: [facility];  Service: Cardiovascular;  Laterality: Bilateral;  . NM MYOVIEW LTD  05/2014   Moderate sized, moderate intensity/partially reversible inferior defect consistent with prior infarct mild/moderate peri-infarct ischemia.  Inferoseptal HK.  INTERMEDIATE RISK. --> Cath showed CTO of Native RCA & SVG-rPDA.  Marland Kitchen RIGHT ABOVE KNEE POPLITEAL GRAFT TO BELOW KNEE POPLITEAL ARTERY BYPASS WITH REVERSE SAPHENOUS VEIN  03-02-2010  . THROMBECTOMY OF RIGHT FEMORAL TO ABOVE KNEE POPLITEAL GORETEX GRAFT ANGIOPLASTY OF GORETEX AND SAPHENOUS VEIN JUNCTION AND ABOVE KNEE POPLITEAL ARTERY   10-06-2010   DR EARLY  . TONSILLECTOMY     . TRANSURETHRAL RESECTION OF BLADDER TUMOR N/A 02/21/2015   Procedure: TRANSURETHRAL RESECTION OF BLADDER TUMOR (TURBT);  Surgeon: Festus Aloe, MD;  Location: [facility];  Service: Urology;  Laterality: N/A;  . VIDEO ASSISTED THORACOSCOPY (VATS)/WEDGE RESECTION Right 05-01-2011    [facility]   LOWER LOBECTOMY W/  NODE DISSECTION    Social History   Socioeconomic History  . Marital status: Married    Spouse name: Not on file  . Number of children: 2  . Years of education: Not on file  . Highest education level: Not on file  Occupational History  . Not on file  Social Needs  . Financial resource strain: Not on file  . Food insecurity:    Worry: Not on file    Inability: Not on file  . Transportation needs:    Medical: Not on file    Non-medical: Not on file  Tobacco Use  . Smoking status: Current Every Day Smoker    Packs/day: 0.50    Years: 55.00    Pack years: 27.50    Types: Cigarettes  . Smokeless tobacco: Never Used  Substance and Sexual Activity  . Alcohol use: No  . Drug use: No  . Sexual activity: Not on file  Lifestyle  . Physical activity:    Days per week: Not on file    Minutes per session: Not on file  . Stress: Not on file  Relationships  . Social connections:    Talks on phone: Not on file    Gets together: Not on file    Attends religious service: Not on file    Active member of club or organization: Not on file    Attends meetings of clubs or organizations: Not on file    Relationship status: Not on file  . Intimate partner violence:    Fear of current or ex partner: Not on file    Emotionally abused: Not on file    Physically abused: Not on file    Forced sexual activity: Not on file  Other Topics Concern  . Not on file  Social History Narrative   He is married with 2 children. Does not exercise regularly.   He and his wife have been recently living in [place], [place] (but just moved back to [place], [place] in November).  He still smokes about a half-pack a day and is not interested in quitting. No alcohol or recreational drug use.    Allergies  Allergen Reactions  . Aripiprazole Other (See Comments)    Altered mental status  . Aspirin Other (See Comments)    Avoids due to severe bruises  . Crestor [Rosuvastatin Calcium] Other (See Comments)    myalgia  . Effexor [Venlafaxine Hydrochloride] Itching  . Lipitor [Atorvastatin Calcium] Other (See Comments)    myalgia  . Morphine And Related Itching  . Zetia [Ezetimibe] Other (See Comments)    myalgia  . Zocor [Simvastatin - High Dose] Other (See Comments)    myalgia    Current Outpatient Medications on File Prior to Visit  Medication Sig Dispense Refill  . acetaminophen (TYLENOL) 500 MG tablet Take 1,000 mg by mouth every 8 (eight) hours as needed.    Marland Kitchen ADVAIR DISKUS 500-50 MCG/DOSE AEPB Inhale 1 puff into the lungs 2 (two) times daily.     Marland Kitchen albuterol (PROVENTIL HFA;VENTOLIN HFA) 108 (90 BASE) MCG/ACT inhaler Inhale 1 puff into the lungs every 6 (six) hours as needed for wheezing or shortness of breath.    Marland Kitchen albuterol (PROVENTIL) (5 MG/ML) 0.5% nebulizer solution Take 2.5 mg by nebulization every 6 (six) hours as needed for wheezing or shortness of breath.    . Cholecalciferol (VITAMIN D3) 1000 UNITS CAPS Take 1 capsule by mouth every morning.    . clonazePAM (KLONOPIN) 1 MG tablet Take 1 tablet by mouth 2 (two) times daily.    . DULoxetine (CYMBALTA) 30 MG capsule Take 30-60 mg by mouth See admin instructions. 60mg  (2 capsules) every morning and 30mg  (1 capsule) daily at evening    . gabapentin (NEURONTIN) 100 MG capsule Take 100 mg by mouth every 8 (eight) hours.     . nitroGLYCERIN (NITROSTAT) 0.4 MG SL tablet Place 1 tablet (0.4 mg total) under the tongue every 5 (five) minutes as needed for chest pain. 25 tablet 6  . oxyCODONE (OXY IR/ROXICODONE) 5 MG immediate release tablet Take 1 tablet (5 mg total) by mouth every 6 (six) hours as needed for  moderate pain. 30 tablet 0  . OXYGEN Inhale 2 L into the lungs at bedtime. Uses O2 -- intolerant to CPAP    . prazosin (MINIPRESS) 1 MG capsule Take 1 mg by mouth at bedtime.    Marland Kitchen QUEtiapine (SEROQUEL XR) 300 MG 24 hr tablet Take 300 mg by mouth at bedtime.     . ramipril (ALTACE) 2.5 MG capsule Take 2.5 mg by mouth every morning.     . tiotropium (SPIRIVA HANDIHALER) 18 MCG inhalation capsule Place 18 mcg into inhaler and inhale daily.    . traZODone (DESYREL) 150 MG tablet Take 0.5 tablets (75 mg total) by mouth at bedtime as needed for sleep. (Patient taking differently: Take 150 mg by mouth at bedtime. ) 10 tablet 0  . zolpidem (AMBIEN CR) 12.5 MG CR tablet Take 12.5 mg by mouth at bedtime.       No current facility-administered medications on file prior to visit.         Left foot with non palpable pulses, + brisk Doppler signals at left PT and peroneal, unable to obtain Doppler signal for left DP.  Left groin is tender to palpation, about 2 cm of incision is open, surrounding tissue is erythematous and moderately swollen. No necrotic tissue, no purulence, scant clear watery drainage.   2+ non pitting edema in left foot and  lower leg.    Medical Decision Making  John Ball is a 77 y.o. year old male who presents s/p left external iliac, common femoral and profundus femoral endarterectomy with Dacron patch angioplasty and reimplantation of native superficial femoral artery into Dacron patch on 11-17-17 by Dr. Donnetta Hutching for critical left lower limb ischemia.  He continues to smoke, is currently using supplemental O2 via [place].  He does not have DM.   Hematoma and partial incision dehiscence at left groin, moderate erythema surrounding incision. .  Dependent and reperfusion edema of left lower leg: pt and wife instructed in proper elevation of his legs to minimize edema in left lower leg and foot.   Dr. Scot Dock spoke with pt and wife and examined pt. Keflex 500 mg po tid x 2 weeks,  follow up in 1 week for left groin and foot evaluation.    Thank you for allowing [place] to participate in this patient's care.  Clemon Chambers, RN, MSN, FNP-C [facility] Office: [phone]  11/27/2017, 10:32 AM  Clinic MD: Early

## 2017-11-28 ENCOUNTER — Inpatient Hospital Stay (HOSPITAL_COMMUNITY)
Admission: EM | Admit: 2017-11-28 | Discharge: 2017-12-05 | DRG: 856 | Disposition: A | Discharge status: Home Health | Payer: Medicare Other | Attending: Internal Medicine | Admitting: Internal Medicine

## 2017-11-28 ENCOUNTER — Emergency Department (HOSPITAL_COMMUNITY): Payer: Medicare Other

## 2017-11-28 ENCOUNTER — Inpatient Hospital Stay (HOSPITAL_COMMUNITY): Payer: Medicare Other | Admitting: Certified Registered Nurse Anesthetist

## 2017-11-28 ENCOUNTER — Encounter (HOSPITAL_COMMUNITY)
Admission: EM | Disposition: A | Discharge status: Home Health | Payer: Self-pay | Source: Home / Self Care | Attending: Internal Medicine

## 2017-11-28 ENCOUNTER — Encounter (HOSPITAL_COMMUNITY): Payer: Self-pay | Admitting: Emergency Medicine

## 2017-11-28 ENCOUNTER — Other Ambulatory Visit: Payer: Self-pay

## 2017-11-28 DIAGNOSIS — I10 Essential (primary) hypertension: Secondary | ICD-10-CM | POA: Diagnosis present

## 2017-11-28 DIAGNOSIS — N179 Acute kidney failure, unspecified: Secondary | ICD-10-CM | POA: Diagnosis present

## 2017-11-28 DIAGNOSIS — G629 Polyneuropathy, unspecified: Secondary | ICD-10-CM | POA: Diagnosis present

## 2017-11-28 DIAGNOSIS — M1990 Unspecified osteoarthritis, unspecified site: Secondary | ICD-10-CM | POA: Diagnosis present

## 2017-11-28 DIAGNOSIS — I5032 Chronic diastolic (congestive) heart failure: Secondary | ICD-10-CM | POA: Diagnosis present

## 2017-11-28 DIAGNOSIS — F17210 Nicotine dependence, cigarettes, uncomplicated: Secondary | ICD-10-CM | POA: Diagnosis not present

## 2017-11-28 DIAGNOSIS — H9193 Unspecified hearing loss, bilateral: Secondary | ICD-10-CM | POA: Diagnosis present

## 2017-11-28 DIAGNOSIS — L089 Local infection of the skin and subcutaneous tissue, unspecified: Secondary | ICD-10-CM | POA: Diagnosis not present

## 2017-11-28 DIAGNOSIS — Z886 Allergy status to analgesic agent status: Secondary | ICD-10-CM | POA: Diagnosis not present

## 2017-11-28 DIAGNOSIS — A419 Sepsis, unspecified organism: Secondary | ICD-10-CM | POA: Diagnosis present

## 2017-11-28 DIAGNOSIS — Z86718 Personal history of other venous thrombosis and embolism: Secondary | ICD-10-CM | POA: Diagnosis not present

## 2017-11-28 DIAGNOSIS — T148XXA Other injury of unspecified body region, initial encounter: Secondary | ICD-10-CM | POA: Diagnosis not present

## 2017-11-28 DIAGNOSIS — Z820 Family history of epilepsy and other diseases of the nervous system: Secondary | ICD-10-CM | POA: Diagnosis not present

## 2017-11-28 DIAGNOSIS — J9611 Chronic respiratory failure with hypoxia: Secondary | ICD-10-CM | POA: Diagnosis present

## 2017-11-28 DIAGNOSIS — Z951 Presence of aortocoronary bypass graft: Secondary | ICD-10-CM | POA: Diagnosis not present

## 2017-11-28 DIAGNOSIS — F319 Bipolar disorder, unspecified: Secondary | ICD-10-CM | POA: Diagnosis present

## 2017-11-28 DIAGNOSIS — B9562 Methicillin resistant Staphylococcus aureus infection as the cause of diseases classified elsewhere: Secondary | ICD-10-CM | POA: Diagnosis not present

## 2017-11-28 DIAGNOSIS — I110 Hypertensive heart disease with heart failure: Secondary | ICD-10-CM | POA: Diagnosis present

## 2017-11-28 DIAGNOSIS — Z888 Allergy status to other drugs, medicaments and biological substances status: Secondary | ICD-10-CM

## 2017-11-28 DIAGNOSIS — T8149XA Infection following a procedure, other surgical site, initial encounter: Secondary | ICD-10-CM | POA: Diagnosis not present

## 2017-11-28 DIAGNOSIS — A4902 Methicillin resistant Staphylococcus aureus infection, unspecified site: Secondary | ICD-10-CM | POA: Diagnosis not present

## 2017-11-28 DIAGNOSIS — Z823 Family history of stroke: Secondary | ICD-10-CM | POA: Diagnosis not present

## 2017-11-28 DIAGNOSIS — G4733 Obstructive sleep apnea (adult) (pediatric): Secondary | ICD-10-CM | POA: Diagnosis present

## 2017-11-28 DIAGNOSIS — I739 Peripheral vascular disease, unspecified: Secondary | ICD-10-CM | POA: Diagnosis present

## 2017-11-28 DIAGNOSIS — T8141XA Infection following a procedure, superficial incisional surgical site, initial encounter: Secondary | ICD-10-CM | POA: Diagnosis present

## 2017-11-28 DIAGNOSIS — Z85118 Personal history of other malignant neoplasm of bronchus and lung: Secondary | ICD-10-CM | POA: Diagnosis not present

## 2017-11-28 DIAGNOSIS — I25709 Atherosclerosis of coronary artery bypass graft(s), unspecified, with unspecified angina pectoris: Secondary | ICD-10-CM | POA: Diagnosis present

## 2017-11-28 DIAGNOSIS — L03314 Cellulitis of groin: Secondary | ICD-10-CM | POA: Diagnosis present

## 2017-11-28 DIAGNOSIS — I9789 Other postprocedural complications and disorders of the circulatory system, not elsewhere classified: Secondary | ICD-10-CM | POA: Diagnosis not present

## 2017-11-28 DIAGNOSIS — Z885 Allergy status to narcotic agent status: Secondary | ICD-10-CM | POA: Diagnosis not present

## 2017-11-28 DIAGNOSIS — Z978 Presence of other specified devices: Secondary | ICD-10-CM | POA: Diagnosis not present

## 2017-11-28 DIAGNOSIS — Z9861 Coronary angioplasty status: Secondary | ICD-10-CM | POA: Diagnosis not present

## 2017-11-28 DIAGNOSIS — J449 Chronic obstructive pulmonary disease, unspecified: Secondary | ICD-10-CM | POA: Diagnosis present

## 2017-11-28 DIAGNOSIS — I25799 Atherosclerosis of other coronary artery bypass graft(s) with unspecified angina pectoris: Secondary | ICD-10-CM | POA: Diagnosis not present

## 2017-11-28 DIAGNOSIS — E785 Hyperlipidemia, unspecified: Secondary | ICD-10-CM | POA: Diagnosis present

## 2017-11-28 DIAGNOSIS — I493 Ventricular premature depolarization: Secondary | ICD-10-CM | POA: Diagnosis not present

## 2017-11-28 DIAGNOSIS — F4310 Post-traumatic stress disorder, unspecified: Secondary | ICD-10-CM | POA: Diagnosis present

## 2017-11-28 DIAGNOSIS — I2510 Atherosclerotic heart disease of native coronary artery without angina pectoris: Secondary | ICD-10-CM | POA: Diagnosis present

## 2017-11-28 DIAGNOSIS — R062 Wheezing: Secondary | ICD-10-CM | POA: Diagnosis not present

## 2017-11-28 DIAGNOSIS — S31109A Unspecified open wound of abdominal wall, unspecified quadrant without penetration into peritoneal cavity, initial encounter: Secondary | ICD-10-CM | POA: Diagnosis not present

## 2017-11-28 DIAGNOSIS — I25810 Atherosclerosis of coronary artery bypass graft(s) without angina pectoris: Secondary | ICD-10-CM | POA: Diagnosis not present

## 2017-11-28 DIAGNOSIS — H9190 Unspecified hearing loss, unspecified ear: Secondary | ICD-10-CM | POA: Diagnosis not present

## 2017-11-28 HISTORY — PX: WOUND EXPLORATION: SHX6188

## 2017-11-28 LAB — CBC WITH DIFFERENTIAL/PLATELET
Basophils Absolute: 0 10*3/uL (ref 0.0–0.1)
Basophils Relative: 0 %
EOS PCT: 0 %
Eosinophils Absolute: 0 10*3/uL (ref 0.0–0.7)
HCT: 36.2 % — ABNORMAL LOW (ref 39.0–52.0)
Hemoglobin: 11.8 g/dL — ABNORMAL LOW (ref 13.0–17.0)
LYMPHS ABS: 1 10*3/uL (ref 0.7–4.0)
Lymphocytes Relative: 4 %
MCH: 33.9 pg (ref 26.0–34.0)
MCHC: 32.6 g/dL (ref 30.0–36.0)
MCV: 104 fL — ABNORMAL HIGH (ref 78.0–100.0)
MONO ABS: 0.5 10*3/uL (ref 0.1–1.0)
Monocytes Relative: 2 %
NEUTROS ABS: 24.7 10*3/uL — AB (ref 1.7–7.7)
Neutrophils Relative %: 94 %
Platelets: 185 10*3/uL (ref 150–400)
RBC: 3.48 MIL/uL — AB (ref 4.22–5.81)
RDW: 14.2 % (ref 11.5–15.5)
WBC: 26.2 10*3/uL — AB (ref 4.0–10.5)

## 2017-11-28 LAB — COMPREHENSIVE METABOLIC PANEL
ALT: 20 U/L (ref 17–63)
AST: 39 U/L (ref 15–41)
Albumin: 3.7 g/dL (ref 3.5–5.0)
Alkaline Phosphatase: 71 U/L (ref 38–126)
Anion gap: 15 (ref 5–15)
BILIRUBIN TOTAL: 0.7 mg/dL (ref 0.3–1.2)
BUN: 35 mg/dL — ABNORMAL HIGH (ref 6–20)
CHLORIDE: 100 mmol/L — AB (ref 101–111)
CO2: 22 mmol/L (ref 22–32)
CREATININE: 2.15 mg/dL — AB (ref 0.61–1.24)
Calcium: 8.8 mg/dL — ABNORMAL LOW (ref 8.9–10.3)
GFR calc Af Amer: 33 mL/min — ABNORMAL LOW (ref >60)
GFR, EST NON AFRICAN AMERICAN: 29 mL/min — AB (ref >60)
GLUCOSE: 182 mg/dL — AB (ref 65–99)
Potassium: 4.2 mmol/L (ref 3.5–5.1)
Sodium: 137 mmol/L (ref 135–145)
Total Protein: 7.5 g/dL (ref 6.5–8.1)

## 2017-11-28 LAB — I-STAT CG4 LACTIC ACID, ED
LACTIC ACID, VENOUS: 1.98 mmol/L — AB (ref 0.5–1.9)
Lactic Acid, Venous: 2.8 mmol/L (ref 0.5–1.9)

## 2017-11-28 SURGERY — WOUND EXPLORATION
Anesthesia: General | Laterality: Left

## 2017-11-28 MED ORDER — MEPERIDINE HCL 50 MG/ML IJ SOLN: 6.2500 mg | INTRAMUSCULAR | Status: DC | PRN

## 2017-11-28 MED ORDER — ONDANSETRON HCL 4 MG/2ML IJ SOLN
INTRAMUSCULAR | Status: DC | PRN
  Administered 2017-11-28: 4 mg via INTRAVENOUS

## 2017-11-28 MED ORDER — PHENYLEPHRINE HCL 10 MG/ML IJ SOLN
INTRAMUSCULAR | Status: DC | PRN
  Administered 2017-11-28: 160 ug via INTRAVENOUS
  Administered 2017-11-28: 80 ug via INTRAVENOUS
  Administered 2017-11-28: 120 ug via INTRAVENOUS

## 2017-11-28 MED ORDER — PIPERACILLIN-TAZOBACTAM 3.375 G IVPB
3.3750 g | Freq: Three times a day (TID) | INTRAVENOUS | Status: DC
  Administered 2017-11-28 – 2017-12-01 (×9): 3.375 g via INTRAVENOUS
  Filled 2017-11-28 (×11): qty 50

## 2017-11-28 MED ORDER — ONDANSETRON HCL 4 MG/2ML IJ SOLN
INTRAMUSCULAR | Status: AC
  Filled 2017-11-28: qty 2

## 2017-11-28 MED ORDER — ROCURONIUM BROMIDE 10 MG/ML (PF) SYRINGE
PREFILLED_SYRINGE | INTRAVENOUS | Status: AC
  Filled 2017-11-28: qty 10

## 2017-11-28 MED ORDER — SODIUM CHLORIDE 0.9 % IV SOLN: INTRAVENOUS | Status: DC

## 2017-11-28 MED ORDER — SUCCINYLCHOLINE 20MG/ML (10ML) SYRINGE FOR MEDFUSION PUMP - OPTIME
INTRAMUSCULAR | Status: DC | PRN
  Administered 2017-11-28: 120 mg via INTRAVENOUS

## 2017-11-28 MED ORDER — SUGAMMADEX SODIUM 200 MG/2ML IV SOLN
INTRAVENOUS | Status: DC | PRN
  Administered 2017-11-28: 200 mg via INTRAVENOUS

## 2017-11-28 MED ORDER — LIDOCAINE 2% (20 MG/ML) 5 ML SYRINGE
INTRAMUSCULAR | Status: AC
  Filled 2017-11-28: qty 25

## 2017-11-28 MED ORDER — OXYCODONE HCL 5 MG PO TABS
5.0000 mg | ORAL_TABLET | Freq: Four times a day (QID) | ORAL | Status: DC | PRN
  Administered 2017-12-01 – 2017-12-05 (×3): 5 mg via ORAL
  Filled 2017-11-28 (×3): qty 1

## 2017-11-28 MED ORDER — ROCURONIUM BROMIDE 100 MG/10ML IV SOLN
INTRAVENOUS | Status: DC | PRN
  Administered 2017-11-28: 30 mg via INTRAVENOUS

## 2017-11-28 MED ORDER — EPHEDRINE SULFATE 50 MG/ML IJ SOLN
INTRAMUSCULAR | Status: DC | PRN
  Administered 2017-11-28 (×3): 5 mg via INTRAVENOUS

## 2017-11-28 MED ORDER — GABAPENTIN 100 MG PO CAPS
100.0000 mg | ORAL_CAPSULE | Freq: Three times a day (TID) | ORAL | Status: DC
  Administered 2017-11-28 – 2017-12-05 (×21): 100 mg via ORAL
  Filled 2017-11-28 (×21): qty 1

## 2017-11-28 MED ORDER — HYDROMORPHONE HCL 2 MG/ML IJ SOLN: 0.2500 mg | INTRAMUSCULAR | Status: DC | PRN

## 2017-11-28 MED ORDER — SODIUM CHLORIDE 0.9 % IV BOLUS
2700.0000 mL | Freq: Once | INTRAVENOUS | Status: AC
  Administered 2017-11-28: 2700 mL via INTRAVENOUS

## 2017-11-28 MED ORDER — VANCOMYCIN HCL 10 G IV SOLR
1250.0000 mg | INTRAVENOUS | Status: DC
  Administered 2017-11-29: 1250 mg via INTRAVENOUS
  Filled 2017-11-28: qty 1250

## 2017-11-28 MED ORDER — ALBUMIN HUMAN 5 % IV SOLN
INTRAVENOUS | Status: DC | PRN
  Administered 2017-11-28: 19:00:00 via INTRAVENOUS

## 2017-11-28 MED ORDER — CLONAZEPAM 1 MG PO TABS
1.0000 mg | ORAL_TABLET | Freq: Two times a day (BID) | ORAL | Status: DC
  Administered 2017-11-28 – 2017-12-05 (×14): 1 mg via ORAL
  Filled 2017-11-28 (×14): qty 1

## 2017-11-28 MED ORDER — PIPERACILLIN-TAZOBACTAM 3.375 G IVPB 30 MIN
3.3750 g | Freq: Once | INTRAVENOUS | Status: AC
  Administered 2017-11-28: 3.375 g via INTRAVENOUS
  Filled 2017-11-28: qty 50

## 2017-11-28 MED ORDER — 0.9 % SODIUM CHLORIDE (POUR BTL) OPTIME
TOPICAL | Status: DC | PRN
  Administered 2017-11-28: 1000 mL

## 2017-11-28 MED ORDER — SODIUM CHLORIDE 0.9 % IV SOLN
INTRAVENOUS | Status: DC
  Administered 2017-11-28: 75 mL/h via INTRAVENOUS
  Administered 2017-11-28 – 2017-11-29 (×3): via INTRAVENOUS

## 2017-11-28 MED ORDER — VANCOMYCIN HCL IN DEXTROSE 1-5 GM/200ML-% IV SOLN
1000.0000 mg | Freq: Once | INTRAVENOUS | Status: AC
  Administered 2017-11-28: 1000 mg via INTRAVENOUS
  Filled 2017-11-28: qty 200

## 2017-11-28 MED ORDER — LIDOCAINE HCL (CARDIAC) PF 100 MG/5ML IV SOSY
PREFILLED_SYRINGE | INTRAVENOUS | Status: DC | PRN
  Administered 2017-11-28: 100 mg via INTRAVENOUS

## 2017-11-28 MED ORDER — FENTANYL CITRATE (PF) 250 MCG/5ML IJ SOLN
INTRAMUSCULAR | Status: AC
  Filled 2017-11-28: qty 5

## 2017-11-28 MED ORDER — SUCCINYLCHOLINE CHLORIDE 200 MG/10ML IV SOSY
PREFILLED_SYRINGE | INTRAVENOUS | Status: AC
  Filled 2017-11-28: qty 10

## 2017-11-28 MED ORDER — PROPOFOL 10 MG/ML IV BOLUS
INTRAVENOUS | Status: DC | PRN
  Administered 2017-11-28: 80 mg via INTRAVENOUS

## 2017-11-28 MED ORDER — PHENYLEPHRINE 40 MCG/ML (10ML) SYRINGE FOR IV PUSH (FOR BLOOD PRESSURE SUPPORT)
PREFILLED_SYRINGE | INTRAVENOUS | Status: AC
  Filled 2017-11-28: qty 20

## 2017-11-28 MED ORDER — ALBUTEROL SULFATE (2.5 MG/3ML) 0.083% IN NEBU: 2.5000 mg | INHALATION_SOLUTION | Freq: Four times a day (QID) | RESPIRATORY_TRACT | Status: DC | PRN

## 2017-11-28 MED ORDER — DULOXETINE HCL 60 MG PO CPEP
60.0000 mg | ORAL_CAPSULE | Freq: Every day | ORAL | Status: DC
  Administered 2017-11-29 – 2017-12-05 (×7): 60 mg via ORAL
  Filled 2017-11-28: qty 2
  Filled 2017-11-28: qty 1
  Filled 2017-11-28: qty 2
  Filled 2017-11-28: qty 1
  Filled 2017-11-28 (×3): qty 2

## 2017-11-28 MED ORDER — DULOXETINE HCL 30 MG PO CPEP
30.0000 mg | ORAL_CAPSULE | Freq: Every day | ORAL | Status: DC
  Administered 2017-11-29 – 2017-12-05 (×7): 30 mg via ORAL
  Filled 2017-11-28 (×7): qty 1

## 2017-11-28 MED ORDER — DEXAMETHASONE SODIUM PHOSPHATE 10 MG/ML IJ SOLN
INTRAMUSCULAR | Status: AC
  Filled 2017-11-28: qty 1

## 2017-11-28 MED ORDER — SODIUM CHLORIDE 0.9 % IV SOLN
INTRAVENOUS | Status: AC
  Filled 2017-11-28: qty 1.2

## 2017-11-28 MED ORDER — DULOXETINE HCL 30 MG PO CPEP
30.0000 mg | ORAL_CAPSULE | Freq: Every day | ORAL | Status: DC
  Administered 2017-11-28: 30 mg via ORAL
  Filled 2017-11-28: qty 1

## 2017-11-28 MED ORDER — QUETIAPINE FUMARATE ER 300 MG PO TB24
300.0000 mg | ORAL_TABLET | Freq: Every day | ORAL | Status: DC
  Administered 2017-11-28 – 2017-12-04 (×7): 300 mg via ORAL
  Filled 2017-11-28 (×8): qty 1

## 2017-11-28 MED ORDER — SUGAMMADEX SODIUM 200 MG/2ML IV SOLN
INTRAVENOUS | Status: AC
  Filled 2017-11-28: qty 2

## 2017-11-28 MED ORDER — ONDANSETRON HCL 4 MG/2ML IJ SOLN: 4.0000 mg | Freq: Once | INTRAMUSCULAR | Status: DC | PRN

## 2017-11-28 MED ORDER — ALBUTEROL SULFATE HFA 108 (90 BASE) MCG/ACT IN AERS: 1.0000 | INHALATION_SPRAY | Freq: Four times a day (QID) | RESPIRATORY_TRACT | Status: DC | PRN

## 2017-11-28 MED ORDER — TRAZODONE HCL 150 MG PO TABS: 75.0000 mg | ORAL_TABLET | Freq: Every evening | ORAL | Status: DC | PRN

## 2017-11-28 MED ORDER — EPHEDRINE SULFATE 50 MG/ML IJ SOLN
INTRAMUSCULAR | Status: AC
  Filled 2017-11-28: qty 2

## 2017-11-28 MED ORDER — HEMOSTATIC AGENTS (NO CHARGE) OPTIME
TOPICAL | Status: DC | PRN
  Administered 2017-11-28: 1 via TOPICAL

## 2017-11-28 MED ORDER — DEXAMETHASONE SODIUM PHOSPHATE 4 MG/ML IJ SOLN
INTRAMUSCULAR | Status: DC | PRN
  Administered 2017-11-28: 10 mg via INTRAVENOUS

## 2017-11-28 MED ORDER — SODIUM CHLORIDE 0.9 % IV SOLN
INTRAVENOUS | Status: DC | PRN
  Administered 2017-11-28: 500 mL

## 2017-11-28 MED ORDER — FENTANYL CITRATE (PF) 100 MCG/2ML IJ SOLN
INTRAMUSCULAR | Status: DC | PRN
  Administered 2017-11-28: 100 ug via INTRAVENOUS

## 2017-11-28 MED ORDER — DEXTROSE 5 % IV SOLN
INTRAVENOUS | Status: DC | PRN
  Administered 2017-11-28: 25 ug/min via INTRAVENOUS
  Administered 2017-11-28: 50 ug/min via INTRAVENOUS

## 2017-11-28 SURGICAL SUPPLY — 38 items
AGENT HMST SPONGE THK3/8 (HEMOSTASIS) ×1
BLADE SURG 11 STRL SS (BLADE) ×2 IMPLANT
BOOT SUTURE AID YELLOW STND (SUTURE) ×2 IMPLANT
CANISTER SUCT 3000ML PPV (MISCELLANEOUS) ×2 IMPLANT
COVER SURGICAL LIGHT HANDLE (MISCELLANEOUS) ×2 IMPLANT
DRSG VAC ATS MED SENSATRAC (GAUZE/BANDAGES/DRESSINGS) ×4 IMPLANT
ELECT REM PT RETURN 9FT ADLT (ELECTROSURGICAL) ×2
ELECTRODE REM PT RTRN 9FT ADLT (ELECTROSURGICAL) ×1 IMPLANT
GLOVE BIO SURGEON STRL SZ 6.5 (GLOVE) ×2 IMPLANT
GLOVE BIO SURGEON STRL SZ7.5 (GLOVE) ×2 IMPLANT
GLOVE BIOGEL PI IND STRL 7.0 (GLOVE) ×3 IMPLANT
GLOVE BIOGEL PI IND STRL 7.5 (GLOVE) ×2 IMPLANT
GLOVE BIOGEL PI INDICATOR 7.0 (GLOVE) ×3
GLOVE BIOGEL PI INDICATOR 7.5 (GLOVE) ×2
GLOVE SURG SS PI 7.5 STRL IVOR (GLOVE) ×2 IMPLANT
GOWN STRL REUS W/ TWL LRG LVL3 (GOWN DISPOSABLE) ×3 IMPLANT
GOWN STRL REUS W/TWL 2XL LVL3 (GOWN DISPOSABLE) ×2 IMPLANT
GOWN STRL REUS W/TWL LRG LVL3 (GOWN DISPOSABLE) ×6
HEMOSTAT SPONGE AVITENE ULTRA (HEMOSTASIS) ×2 IMPLANT
KIT BASIN OR (CUSTOM PROCEDURE TRAY) ×2 IMPLANT
KIT TURNOVER KIT B (KITS) ×2 IMPLANT
LOOP VESSEL MAXI BLUE (MISCELLANEOUS) ×2 IMPLANT
LOOP VESSEL MINI RED (MISCELLANEOUS) ×2 IMPLANT
NS IRRIG 1000ML POUR BTL (IV SOLUTION) ×4 IMPLANT
PACK GENERAL/GYN (CUSTOM PROCEDURE TRAY) ×2 IMPLANT
PAD ARMBOARD 7.5X6 YLW CONV (MISCELLANEOUS) ×4 IMPLANT
SUT PROLENE 5 0 C 1 36 (SUTURE) ×2 IMPLANT
SUT SILK 2 0 (SUTURE)
SUT SILK 2-0 18XBRD TIE 12 (SUTURE) IMPLANT
SUT SILK 3 0 (SUTURE)
SUT SILK 3-0 18XBRD TIE 12 (SUTURE) IMPLANT
SUT SILK 4 0 (SUTURE) ×2
SUT SILK 4-0 18XBRD TIE 12 (SUTURE) ×1 IMPLANT
SWAB COLLECTION DEVICE MRSA (MISCELLANEOUS) ×2 IMPLANT
SWAB CULTURE ESWAB REG 1ML (MISCELLANEOUS) ×2 IMPLANT
TOWEL GREEN STERILE (TOWEL DISPOSABLE) ×2 IMPLANT
WATER STERILE IRR 1000ML POUR (IV SOLUTION) ×4 IMPLANT
WND VAC CANISTER 500ML (MISCELLANEOUS) ×2 IMPLANT

## 2017-11-28 NOTE — ED Notes (Signed)
ED TO INPATIENT HANDOFF REPORT  Name/Age/Gender John Ball 77 y.o. male  Code Status    Code Status Orders  (From admission, onward)        Start     Ordered   11/28/17 1337  Full code  Continuous     11/28/17 1339    Code Status History    Date Active Date Inactive Code Status Order ID Comments User Context   11/17/2017 1328 11/18/2017 1203 Full Code [id number]  Orbie Hurst Inpatient   11/12/2017 1122 11/12/2017 1815 Full Code [id number]  Waynetta Sandy, MD Inpatient   09/12/2017 1853 09/13/2017 1802 Full Code [id number]  Florencia Reasons, MD Inpatient   07/21/2016 2318 07/23/2016 1600 Full Code [id number]  Ivor Costa, MD Inpatient   07/12/2014 1314 07/12/2014 2002 Full Code [id number]  Troy Sine, MD Inpatient   01/10/2014 2029 01/12/2014 1332 Full Code [id number]  Natividad Brood Inpatient   01/06/2014 1741 01/08/2014 1607 Full Code [id number]  Markus Daft, MD Inpatient    Advance Directive Documentation     Most Recent Value  Type of Advance Directive  Healthcare Power of Attorney  Pre-existing out of facility DNR order (yellow form or pink MOST form)  -  "MOST" Form in Place?  -      Home/SNF/Other    Chief Complaint diff walking  Level of Care/Admitting Diagnosis ED Disposition    ED Disposition Condition [place]: [place] [100100]  Level of Care: Telemetry [5]  Diagnosis: Wound infection [287681]  Admitting Physician: Phillips Grout [[id number]]  Attending Physician: Derrill Kay A [[id number]]  Estimated length of stay: 3 - 4 days  Certification:: I certify this patient will need inpatient services for at least 2 midnights  PT Class (Do Not Modify): Inpatient [101]  PT Acc Code (Do Not Modify): Private [1]       Medical History Past Medical History:  Diagnosis Date  . Anxiety   . Arthritis   . Complication of anesthesia    hx prolonged post op oxygen dependent/  hx hallucinations for 2 day post op lobectomy  2012  . COPD, severe ([place])   . Depression   . Essential hypertension   . Hearing loss of both ears    does wear his hearing aids  . History of arterial bypass of lower extremity    RIGHT FEM-POP  . History of CHF (congestive heart failure)   . History of DVT of lower extremity   . History of panic attacks   . Hyperlipidemia with target LDL less than 70   . Left main coronary artery disease 2004   a) Severe LM CAD 2004 -->s/p  cabg x4 (LIMA-LAD, free RIMA-RI, SVG-OM 2, SVG-RPDA); b) 06/2014: Abnormal Myoview --> c)cardiac Cath Jan 2016: pLM 70%, pLAD 40-50%- patent LIMA-LAD.  RI -competitive flow noted from RIMA-RI graft, circumflex, normal caliber with moderate OM1.  OM 2 occluded.  SVG-OM 2 patent. RCA CTO after large RV M, faint R-R and LAD septal-PDA collaterals =--> new  . Neuropathy, peripheral   . 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 Nocturia   . OSA (obstructive sleep apnea)    uses O2  via [place]  --  intolerate to CPAP  . Peripheral arterial occlusive disease ([place])    2011   a) Gore-Tex graft right AK popA-BK popA --> b) 2012: thrombosed graft --> thrombectomy with dacron patch =  1 V runnof via Peroneal; c) 01/2014: R femoropopliteal bypass with left femoral vein;; followed by dr Early--  per last dopplers 06-28-2014 no change right graft,  50-74% stenosis common and left mid superficial femoral artery  . PTSD (post-traumatic stress disorder)    anxiety attack's---  [place] [employer]  . Recurrent productive cough    SMOKER'S COUGH  . 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  Medical management  . Smoker unmotivated to quit    Reportedly he came close to having a nervous breakdown when he last tried to quit  . 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 Wears glasses     Allergies Allergies  Allergen Reactions  . Aripiprazole Other (See Comments)    Altered mental status  . Aspirin Other (See Comments)    Avoids due to severe bruises  . Crestor [Rosuvastatin Calcium] Other (See Comments)    myalgia  . Effexor [Venlafaxine Hydrochloride] Itching  . Lipitor [Atorvastatin Calcium] Other (See Comments)    myalgia  . Morphine And Related Itching  . Zetia [Ezetimibe] Other (See Comments)    myalgia  . Zocor [Simvastatin - High Dose] Other (See Comments)    myalgia    IV Location/Drains/Wounds Patient Lines/Drains/Airways Status   Active Line/Drains/Airways    Name:   Placement date:   Placement time:   Site:   Days:   Peripheral IV 09/12/17 Left Antecubital   09/12/17    1500    Antecubital   77   Peripheral IV 11/28/17 Right Antecubital   11/28/17    1140    Antecubital   less than 1   Incision (Closed) 11/17/17 Groin Left   11/17/17    1015     11          Labs/Imaging Results for orders placed or performed during the hospital encounter of 11/28/17 (from the past 48 hour(s))  Comprehensive metabolic panel     Status: Abnormal   Collection Time: 11/28/17 11:16 AM  Result Value Ref Range   Sodium 137 135 - 145 mmol/L   Potassium 4.2 3.5 - 5.1 mmol/L   Chloride 100 (L) 101 - 111 mmol/L   CO2 22 22 - 32 mmol/L   Glucose, Bld 182 (H) 65 - 99 mg/dL   BUN 35 (H) 6 - 20 mg/dL   Creatinine, Ser 2.15 (H) 0.61 - 1.24 mg/dL   Calcium 8.8 (L) 8.9 - 10.3 mg/dL   Total Protein 7.5 6.5 - 8.1 g/dL   Albumin 3.7 3.5 - 5.0 g/dL   AST 39 15 - 41 U/L   ALT 20 17 - 63 U/L   Alkaline Phosphatase 71 38 - 126  U/L   Total Bilirubin 0.7 0.3 - 1.2 mg/dL   GFR calc non Af Amer 29 (L) >60 mL/min   GFR calc Af Amer 33 (L) >60 mL/min  Comment: (NOTE) The eGFR has been calculated using the CKD EPI equation. This calculation has not been validated in all clinical situations. eGFR's persistently <60 mL/min signify possible Chronic Kidney Disease.    Anion gap 15 5 - 15    Comment: Performed at [facility], [place] [address], [place], [place] [address]  CBC WITH DIFFERENTIAL     Status: Abnormal   Collection Time: 11/28/17 11:16 AM  Result Value Ref Range   WBC 26.2 (H) 4.0 - 10.5 K/uL   RBC 3.48 (L) 4.22 - 5.81 MIL/uL   Hemoglobin 11.8 (L) 13.0 - 17.0 g/dL   HCT 36.2 (L) 39.0 - 52.0 %   MCV 104.0 (H) 78.0 - 100.0 fL   MCH 33.9 26.0 - 34.0 pg   MCHC 32.6 30.0 - 36.0 g/dL   RDW 14.2 11.5 - 15.5 %   Platelets 185 150 - 400 K/uL   Neutrophils Relative % 94 %   Lymphocytes Relative 4 %   Monocytes Relative 2 %   Eosinophils Relative 0 %   Basophils Relative 0 %   Neutro Abs 24.7 (H) 1.7 - 7.7 K/uL   Lymphs Abs 1.0 0.7 - 4.0 K/uL   Monocytes Absolute 0.5 0.1 - 1.0 K/uL   Eosinophils Absolute 0.0 0.0 - 0.7 K/uL   Basophils Absolute 0.0 0.0 - 0.1 K/uL   WBC Morphology MILD LEFT SHIFT (1-5% METAS, OCC MYELO, OCC BANDS)     Comment: Performed at [facility], [place] [address] [place], [place] [address]  I-Stat CG4 Lactic Acid, ED  (not at  [facility])     Status: Abnormal   Collection Time: 11/28/17 11:22 AM  Result Value Ref Range   Lactic Acid, Venous 2.80 (HH) 0.5 - 1.9 mmol/L   Comment NOTIFIED PHYSICIAN   I-Stat CG4 Lactic Acid, ED  (not at  [facility])     Status: Abnormal   Collection Time: 11/28/17  1:02 PM  Result Value Ref Range   Lactic Acid, Venous 1.98 (H) 0.5 - 1.9 mmol/L   Dg Chest Port 1 View  Result Date: 11/28/2017 CLINICAL DATA:  Sepsis EXAM: PORTABLE CHEST 1 VIEW COMPARISON:  09/22/2017 FINDINGS: Cardiac shadow is within normal limits. Postsurgical  changes are noted. Elevation right hemidiaphragm is again seen with blunting of the right costophrenic angle stable from the prior exam. No focal infiltrate or sizable effusion is seen. No bony abnormality is noted. IMPRESSION: Chronic changes without acute abnormality. Electronically Signed   By: Inez Catalina M.D.   On: 11/28/2017 11:32    Pending Labs Unresulted Labs (From admission, onward)   Start     Ordered   11/29/17 9024  Basic metabolic panel  Tomorrow morning,   R     11/28/17 1339   11/29/17 0500  CBC  Tomorrow morning,   R     11/28/17 1339   11/28/17 1338  Lactic acid, plasma  STAT Now then every 3 hours,   R     11/28/17 1339   11/28/17 1106  Blood Culture (routine x 2)  BLOOD CULTURE X 2,   STAT     11/28/17 1106      Vitals/Pain Today's Vitals   11/28/17 1330 11/28/17 1431 11/28/17 1500 11/28/17 1537  BP: 99/69 95/64 91/62 (!) 98/52  Pulse:      Resp: (!) 21 20 (!) 22 (!) 22  SpO2: 99% 96% 97% 97%  PainSc:        Isolation Precautions No active  isolations  Medications Medications  albuterol (PROVENTIL) (2.5 MG/3ML) 0.083% nebulizer solution 2.5 mg (has no administration in time range)  0.9 %  sodium chloride infusion (75 mL/hr Intravenous New Bag/Given 11/28/17 1400)  piperacillin-tazobactam (ZOSYN) IVPB 3.375 g (has no administration in time range)  vancomycin (VANCOCIN) 1,250 mg in sodium chloride 0.9 % 250 mL IVPB (has no administration in time range)  piperacillin-tazobactam (ZOSYN) IVPB 3.375 g (0 g Intravenous Stopped 11/28/17 1204)  vancomycin (VANCOCIN) IVPB 1000 mg/200 mL premix (0 mg Intravenous Stopped 11/28/17 1309)  sodium chloride 0.9 % bolus 2,700 mL (0 mLs Intravenous Stopped 11/28/17 1359)  vancomycin (VANCOCIN) IVPB 1000 mg/200 mL premix (0 mg Intravenous Stopped 11/28/17 1529)    Mobility walks with person assist

## 2017-11-28 NOTE — Transfer of Care (Signed)
Immediate Anesthesia Transfer of Care Note  Patient: John Ball  Procedure(s) Performed: WOUND EXPLORATION GROIN, WASHOUT AND WOUND VAC APPLICATION (Left )  Patient Location: PACU  Anesthesia Type:General  Level of Consciousness: awake, drowsy and patient cooperative  Airway & Oxygen Therapy: Patient Spontanous Breathing and Patient connected to nasal cannula oxygen  Post-op Assessment: Report given to RN and Post -op Vital signs reviewed and stable  Post vital signs: Reviewed and stable  Last Vitals:  Vitals Value Taken Time  BP    Temp    Pulse 87 11/28/2017 10:05 PM  Resp 19 11/28/2017 10:05 PM  SpO2 96 % 11/28/2017 10:05 PM  Vitals shown include unvalidated device data.  Last Pain:  Vitals:   11/28/17 2101  TempSrc: Oral  PainSc:          Complications: No apparent anesthesia complications

## 2017-11-28 NOTE — ED Notes (Signed)
Becky/wife   [phone]

## 2017-11-28 NOTE — Consult Note (Addendum)
Progress Note    11/28/2017 5:45 PM   Subjective:  Wife states he had chills overnight; left groin is draining fluid.    HR 70's 41'P-379'K systolic 24% [id number]  Vitals:   11/28/17 1615 11/28/17 1630  BP:  92/62  Pulse:    Resp: (!) 27 (!) 23  SpO2: 97% 96%    Physical Exam: Cardiac:  regular Lungs:  Non labored Incisions:  Left groin  has erythema with serous drainage. Extremities:  Left leg swollen with pitting edema; +palpable DP pulse left foot.   CBC    Component Value Date/Time   WBC 26.2 (H) 11/28/2017 1116   RBC 3.48 (L) 11/28/2017 1116   HGB 11.8 (L) 11/28/2017 1116   HGB 14.6 09/25/2017 1420   HGB 14.4 05/02/2014 1156   HCT 36.2 (L) 11/28/2017 1116   HCT 43.0 09/25/2017 1420   HCT 43.9 05/02/2014 1156   PLT 185 11/28/2017 1116   PLT 228 09/25/2017 1420   MCV 104.0 (H) 11/28/2017 1116   MCV 98 (H) 09/25/2017 1420   MCV 96.1 05/02/2014 1156   MCH 33.9 11/28/2017 1116   MCHC 32.6 11/28/2017 1116   RDW 14.2 11/28/2017 1116   RDW 14.6 09/25/2017 1420   RDW 14.1 05/02/2014 1156   LYMPHSABS 1.0 11/28/2017 1116   LYMPHSABS 1.6 05/02/2014 1156   MONOABS 0.5 11/28/2017 1116   MONOABS 0.4 05/02/2014 1156   EOSABS 0.0 11/28/2017 1116   EOSABS 0.2 05/02/2014 1156   BASOSABS 0.0 11/28/2017 1116   BASOSABS 0.0 05/02/2014 1156    BMET    Component Value Date/Time   NA 137 11/28/2017 1116   NA 143 09/25/2017 1420   NA 139 05/02/2014 1156   K 4.2 11/28/2017 1116   K 4.1 05/02/2014 1156   CL 100 (L) 11/28/2017 1116   CO2 22 11/28/2017 1116   CO2 24 05/02/2014 1156   GLUCOSE 182 (H) 11/28/2017 1116   GLUCOSE 101 05/02/2014 1156   BUN 35 (H) 11/28/2017 1116   BUN 18 09/25/2017 1420   BUN 15.7 05/02/2014 1156   CREATININE 2.15 (H) 11/28/2017 1116   CREATININE 1.21 07/07/2014 1105   CREATININE 1.1 05/02/2014 1156   CALCIUM 8.8 (L) 11/28/2017 1116   CALCIUM 9.5 05/02/2014 1156   GFRNONAA 29 (L) 11/28/2017 1116   GFRAA 33 (L) 11/28/2017 1116     INR    Component Value Date/Time   INR 1.05 11/12/2017 1344     Intake/Output Summary (Last 24 hours) at 11/28/2017 1745 Last data filed at 11/28/2017 1359 Gross per 24 hour  Intake 2950 ml  Output -  Net 2950 ml     Assessment:  77 y.o. male is s/p external iliac, common femoral and profundus femoral artery endarterectomy with Dacron patch angioplasty and reimplantation of native superficial femoral artery into Dacron patch on 11/17/17 and pt was discharged home the next day who returns to the hosptial today with chills overnight and weakness (unable to walk)    Plan: -pt transferred from [facility] ED to [place] with leukocytosis of 26k, chills-sepsis.  He has been started on IV abx.  He is npo since 8am this morning. -his left groin is with erythema and serous drainage.  -pt with hypotension and leukocytosis-given the fact he is septic and he has a dacron patch present, will take emergently to the operating room for left groin exploration.  Dr. Bridgett Larsson discussed with pt and wife that the dacron patch may need to be removed if infected.  Pt and wife expressed understanding and agreed to proceed.  -pt has acute kidney injury - would hold ACEI.   -labs in am  Leontine Locket, PA-C [facility] [phone] 11/28/2017 5:45 PM   Addendum  I have independently interviewed and examined the patient, and I agree with the physician assistant's findings.  Pt is presenting with early sepsis.  L groin will need exploration given the erythema c/w cellulitis.  We discussed minimally draining the L groin and placement of VAC dressing if indicated.  Depending on the findings, removal the Dacron patch and placement of a vein vs Bovine patch might be needed.   The risk, benefits, and alternative for this operation were discussed with the patient.    The patient is aware the risks include but are not limited to: bleeding, infection, myocardial infarction, stroke, limb loss, nerve damage,  limb edema, need for additional procedures in the future, wound complications, and inability to complete the arterial reconstruction  The patient is aware of these risks and agreed to proceed.   Adele Barthel, MD, FACS [facility] [place] Office: [phone] Pager: [phone]  11/28/2017, 6:06 PM

## 2017-11-28 NOTE — H&P (Signed)
History and Physical    John Ball:[id number] DOB: September 15, 1940 DOA: 11/28/2017  PCP: Mellody Dance, DO  Patient coming from: Home  Chief Complaint: Groin area swollen and red  HPI: John Ball is a 77 y.o. male with medical history significant of COPD, CHF, DVT, coronary artery disease status post CABG, with recent ischemic left lower extremity status post common femoral profundus endarterectomy with Dacron patch angioplasty reimplantation of native superficial femoral artery into [place] patch by Dr. early on Nov 17, 2017 comes in with over 2 days of worsening swelling to the left groin area.  Patient went to see vascular surgery yesterday as an outpatient and he was started on some antibiotics for a developing cellulitis started on Keflex.  The area got progressively worse with more redness and draining material so came to the emergency department this morning.  Patient has been having chills and subjective fevers.  No nausea vomiting or diarrhea.  Dr. Bridgett Larsson at [facility] vascular team was called who wishes for patient to be transferred to [facility] for further evaluation.  Patient is being referred for admission for postop infection.  Review of Systems: As per HPI otherwise 10 point review of systems negative.   Past Medical History:  Diagnosis Date  . Anxiety   . Arthritis   . Complication of anesthesia    hx prolonged post op oxygen dependent/  hx hallucinations for 2 day post op lobectomy 2012  . COPD, severe ([place])   . Depression   . Essential hypertension   . Hearing loss of both ears    does wear his hearing aids  . History of arterial bypass of lower extremity    RIGHT FEM-POP  . History of CHF (congestive heart failure)   . History of DVT of lower extremity   . History of panic attacks   . Hyperlipidemia with target LDL less than 70   . Left main coronary artery disease 2004   a) Severe LM CAD 2004 -->s/p  cabg x4 (LIMA-LAD, free RIMA-RI, SVG-OM 2, SVG-RPDA);  b) 06/2014: Abnormal Myoview --> c)cardiac Cath Jan 2016: pLM 70%, pLAD 40-50%- patent LIMA-LAD.  RI -competitive flow noted from RIMA-RI graft, circumflex, normal caliber with moderate OM1.  OM 2 occluded.  SVG-OM 2 patent. RCA CTO after large RV M, faint R-R and LAD septal-PDA collaterals =--> new  . Neuropathy, peripheral   . 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 Nocturia   . OSA (obstructive sleep apnea)    uses O2  via [place]  --  intolerate to CPAP  . Peripheral arterial occlusive disease ([place])    2011   a) Gore-Tex graft right AK popA-BK popA --> b) 2012: thrombosed graft --> thrombectomy with dacron patch = 1 V runnof via Peroneal; c) 01/2014: R femoropopliteal bypass with left femoral vein;; followed by dr Early--  per last dopplers 06-28-2014 no change right graft,  50-74% stenosis common and left mid superficial femoral artery  . PTSD (post-traumatic stress disorder)    anxiety attack's---  [place] [employer]  . Recurrent productive cough    SMOKER'S COUGH  . 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  Medical management  . Smoker unmotivated to quit    Reportedly he came close to having a nervous breakdown when he last  tried to quit  . 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 Wears glasses     Past Surgical History:  Procedure Laterality Date  . ABDOMINAL AORTOGRAM N/A 11/12/2017   Procedure: ABDOMINAL AORTOGRAM;  Surgeon: Waynetta Sandy, MD;  Location: [place] [facility];  Service: Cardiovascular;  Laterality: N/A;  . CARDIAC CATHETERIZATION  07-13-2002   Ischemia RCA region on Cardiolite// 60-70% ostial left main, 50% midCx, 60-70% mid RI,  70% mRCA, 90% JeNu and crux 75% RCA ,  95% PLA//  severe LM and 3Vessel CAD, perserved LV, ef 60%  . CARDIOVASCULAR STRESS TEST  last one  06-17-2014   dr  Ellyn Hack   Carlton Adam study with no exercise; Intermediate Risk Study;   moderate size and intensity, partially reversible inferior septal defect consistent with prior infarct and mild to moderate peri-infarct ischemia;  mild hypokinesis of the inferior septal wall,  normal LV function, ef 56%  . COLONOSCOPY  2008    WNL  . CORONARY ARTERY BYPASS GRAFT  07/27/2002   LIMA-LAD, freeRIMA-RI,SVG-OM2, SVG-rPDA; Dr. Roxan Hockey  . CYSTOSCOPY W/ URETERAL STENT PLACEMENT N/A 02/21/2015   Procedure: BILATERAL RETROGRADE PYELOGRAM;  Surgeon: Festus Aloe, MD;  [facility]: [facility]  Service: Urology;  Laterality: N/A;  . ENDARTERECTOMY FEMORAL Left 11/17/2017   Procedure: LEFT Femoral Endarterectomy with Patch Angioplasty;  Surgeon: Rosetta Posner, MD;  Location: [place];  Service: Vascular;  Laterality: Left;  . EYE SURGERY Left Nov 2013  . FEMORAL ARTERY - POPLITEAL ARTERY BYPASS GRAFT Right 10-24-2008   dr early   w/ GoreTex graft  . FEMORAL-TIBIAL BYPASS GRAFT Right 01/10/2014   Procedure: Right Femoral to Posterior Tibial Bypass Graft using Left Leg Vein, Thrombectomy Right Common Femoral and Profunda of Leg . ;  Surgeon: Rosetta Posner, MD;  Location: [place];  Service: Vascular;  Laterality: Right;  . HYDROCELE EXCISION Left   . LEFT ANKLE SURGERY  1990's  . LEFT HEART CATHETERIZATION WITH CORONARY/GRAFT ANGIOGRAM N/A 07/12/2014   Procedure: LEFT HEART CATHETERIZATION WITH Beatrix Fetters;  Surgeon: Troy Sine, MD;  Location: [facility];  Service: Cardiovascular;  Laterality: N/A;  pLM 70%, pLAD 40-50%-  patent LIMA-LAD.  RI -competitive flow noted from fRIMA-RI graft, circumflex, normal caliber with moderate OM1.  OM 2 occluded.  SVG-OM 2 patent. RCA CTO after large RVM, faint R-R and LAD septal-PDA collateral  . LOWER EXTREMITY ANGIOGRAPHY Bilateral 11/12/2017   Procedure: LOWER EXTREMITY ANGIOGRAPHY - Left Lower;  Surgeon: Waynetta Sandy, MD;  Location: [place] [facility];  Service: Cardiovascular;  Laterality: Bilateral;  . NM MYOVIEW LTD  05/2014   Moderate sized, moderate intensity/partially reversible inferior defect consistent with prior infarct mild/moderate peri-infarct ischemia.  Inferoseptal HK.  INTERMEDIATE RISK. --> Cath showed CTO of Native RCA & SVG-rPDA.  Marland Kitchen RIGHT ABOVE KNEE POPLITEAL GRAFT TO BELOW KNEE POPLITEAL ARTERY BYPASS WITH REVERSE SAPHENOUS VEIN  03-02-2010  . THROMBECTOMY OF RIGHT FEMORAL TO ABOVE KNEE POPLITEAL GORETEX GRAFT ANGIOPLASTY OF GORETEX AND SAPHENOUS  VEIN JUNCTION AND ABOVE KNEE POPLITEAL ARTERY   10-06-2010   DR EARLY  . TONSILLECTOMY    . TRANSURETHRAL RESECTION OF BLADDER TUMOR N/A 02/21/2015   Procedure: TRANSURETHRAL RESECTION OF BLADDER TUMOR (TURBT);  Surgeon: Festus Aloe, MD;  Location[facility];  Service: Urology;  Laterality: N/A;  . VIDEO ASSISTED THORACOSCOPY (VATS)/WEDGE RESECTION Right 05-01-2011    FORSYTH   LOWER LOBECTOMY W/  NODE DISSECTION     reports that he has been smoking cigarettes.  He has a 27.50 pack-year smoking history. He has never used smokeless tobacco. He reports that he does not drink alcohol or use drugs.  Allergies  Allergen Reactions  . Aripiprazole Other (See Comments)    Altered mental status  . Aspirin Other (See Comments)    Avoids due to severe bruises  . Crestor [Rosuvastatin Calcium] Other (See Comments)    myalgia  . Effexor [Venlafaxine Hydrochloride] Itching  . Lipitor [Atorvastatin Calcium] Other (See Comments)    myalgia  . Morphine And Related Itching  . Zetia  [Ezetimibe] Other (See Comments)    myalgia  . Zocor [Simvastatin - High Dose] Other (See Comments)    myalgia    Family History  Problem Relation Age of Onset  . Other Mother 24       Abdominal Aortic Aneurysm   . Stroke Mother   . Alzheimer's disease Father 6  . Stroke Father     Prior to Admission medications   Medication Sig Start Date End Date Taking? Authorizing Provider  acetaminophen (TYLENOL) 500 MG tablet Take 1,000 mg by mouth every 8 (eight) hours as needed.   Yes [provider]  ADVAIR DISKUS 500-50 MCG/DOSE AEPB Inhale 1 puff into the lungs 2 (two) times daily.  03/03/17  Yes [provider]  albuterol (PROVENTIL HFA;VENTOLIN HFA) 108 (90 BASE) MCG/ACT inhaler Inhale 1 puff into the lungs every 6 (six) hours as needed for wheezing or shortness of breath.   Yes [provider]  albuterol (PROVENTIL) (5 MG/ML) 0.5% nebulizer solution Take 2.5 mg by nebulization every 6 (six) hours as needed for wheezing or shortness of breath.   Yes [provider]  cephALEXin (KEFLEX) 500 MG capsule Take 1 capsule (500 mg total) by mouth 3 (three) times daily. 11/27/17  Yes Nickel, Sharmon Leyden, NP  Cholecalciferol (VITAMIN D3) 1000 UNITS CAPS Take 1 capsule by mouth every morning.   Yes [provider]  clonazePAM (KLONOPIN) 1 MG tablet Take 1 tablet by mouth 2 (two) times daily. 11/01/17  Yes [provider]  DULoxetine (CYMBALTA) 30 MG capsule Take 30-60 mg by mouth See admin instructions. 60mg  (2 capsules) every morning and 30mg  (1 capsule) daily at evening   Yes [provider]  gabapentin (NEURONTIN) 100 MG capsule Take 100 mg by mouth every 8 (eight) hours.    Yes [provider]  oxyCODONE (OXY IR/ROXICODONE) 5 MG immediate release tablet Take 1 tablet (5 mg total) by mouth every 6 (six) hours as needed for moderate pain. 11/18/17  Yes Ulyses Amor, PA-C  OXYGEN Inhale 2 L into the lungs at bedtime. Uses O2 --  intolerant to CPAP   Yes [provider]  prazosin (MINIPRESS) 1 MG capsule Take 1 mg by mouth at bedtime.   Yes [provider]  QUEtiapine (SEROQUEL XR) 300 MG 24 hr tablet Take 300 mg by mouth at bedtime.    Yes [provider]  ramipril (ALTACE) 2.5 MG capsule Take  2.5 mg by mouth every morning.    Yes [provider]  tiotropium (SPIRIVA HANDIHALER) 18 MCG inhalation capsule Place 18 mcg into inhaler and inhale daily.   Yes [provider]  traZODone (DESYREL) 150 MG tablet Take 0.5 tablets (75 mg total) by mouth at bedtime as needed for sleep. Patient taking differently: Take 150 mg by mouth at bedtime.  09/13/17  Yes Florencia Reasons, MD  zolpidem (AMBIEN CR) 12.5 MG CR tablet Take 12.5 mg by mouth at bedtime.     Yes [provider]  nitroGLYCERIN (NITROSTAT) 0.4 MG SL tablet Place 1 tablet (0.4 mg total) under the tongue every 5 (five) minutes as needed for chest pain. 08/28/17   Leonie Man, MD    Physical Exam: Vitals:   11/28/17 1051 11/28/17 1053 11/28/17 1300  BP: (!) 86/62  119/61  Pulse: 85    Resp: (!) 26  14  SpO2: (!) 89% 98%       Constitutional: NAD, calm, comfortable Vitals:   11/28/17 1051 11/28/17 1053 11/28/17 1300  BP: (!) 86/62  119/61  Pulse: 85    Resp: (!) 26  14  SpO2: (!) 89% 98%    Eyes: PERRL, lids and conjunctivae normal ENMT: Mucous membranes are moist. Posterior pharynx clear of any exudate or lesions.Normal dentition.  Neck: normal, supple, no masses, no thyromegaly Respiratory: clear to auscultation bilaterally, no wheezing, no crackles. Normal respiratory effort. No accessory muscle use.  Cardiovascular: Regular rate and rhythm, no murmurs / rubs / gallops. No extremity edema. No carotid bruits.  Abdomen: no tenderness, no masses palpated. No hepatosplenomegaly. Bowel sounds positive.  Musculoskeletal: no clubbing / cyanosis. No joint deformity upper and lower extremities. Good ROM, no  contractures. Normal muscle tone.  Skin: Left groin with induration and purulent discharge from possible early abscess formation with surrounding cellulitis Neurologic: CN 2-12 grossly intact. Sensation intact, DTR normal. Strength 5/5 in all 4.  Psychiatric: Normal judgment and insight. Alert and oriented x 3. Normal mood.    Labs on Admission: I have personally reviewed following labs and imaging studies  CBC: Recent Labs  Lab 11/28/17 1116  WBC 26.2*  NEUTROABS 24.7*  HGB 11.8*  HCT 36.2*  MCV 104.0*  PLT 449   Basic Metabolic Panel: Recent Labs  Lab 11/28/17 1116  NA 137  K 4.2  CL 100*  CO2 22  GLUCOSE 182*  BUN 35*  CREATININE 2.15*  CALCIUM 8.8*   GFR: Estimated Creatinine Clearance: 35.9 mL/min (A) (by C-G formula based on SCr of 2.15 mg/dL (H)). Liver Function Tests: Recent Labs  Lab 11/28/17 1116  AST 39  ALT 20  ALKPHOS 71  BILITOT 0.7  PROT 7.5  ALBUMIN 3.7   No results for input(s): LIPASE, AMYLASE in the last 168 hours. No results for input(s): AMMONIA in the last 168 hours. Coagulation Profile: No results for input(s): INR, PROTIME in the last 168 hours. Cardiac Enzymes: No results for input(s): CKTOTAL, CKMB, CKMBINDEX, TROPONINI in the last 168 hours. BNP (last 3 results) No results for input(s): PROBNP in the last 8760 hours. HbA1C: No results for input(s): HGBA1C in the last 72 hours. CBG: No results for input(s): GLUCAP in the last 168 hours. Lipid Profile: No results for input(s): CHOL, HDL, LDLCALC, TRIG, CHOLHDL, LDLDIRECT in the last 72 hours. Thyroid Function Tests: No results for input(s): TSH, T4TOTAL, FREET4, T3FREE, THYROIDAB in the last 72 hours. Anemia Panel: No results for input(s): VITAMINB12, FOLATE, FERRITIN, TIBC, IRON,  RETICCTPCT in the last 72 hours. Urine analysis:    Component Value Date/Time   COLORURINE YELLOW 11/12/2017 1344   APPEARANCEUR CLEAR 11/12/2017 1344   LABSPEC >1.046 (H) 11/12/2017 1344    PHURINE 7.0 11/12/2017 1344   GLUCOSEU NEGATIVE 11/12/2017 1344   HGBUR NEGATIVE 11/12/2017 1344   BILIRUBINUR NEGATIVE 11/12/2017 1344   KETONESUR NEGATIVE 11/12/2017 1344   PROTEINUR NEGATIVE 11/12/2017 1344   UROBILINOGEN 0.2 02/23/2010 1546   NITRITE NEGATIVE 11/12/2017 1344   LEUKOCYTESUR NEGATIVE 11/12/2017 1344   Sepsis Labs: !!!!!!!!!!!!!!!!!!!!!!!!!!!!!!!!!!!!!!!!!!!! @LABRCNTIP (procalcitonin:4,lacticidven:4) )No results found for this or any previous visit (from the past 240 hour(s)).   Radiological Exams on Admission: Dg Chest Port 1 View  Result Date: 11/28/2017 CLINICAL DATA:  Sepsis EXAM: PORTABLE CHEST 1 VIEW COMPARISON:  09/22/2017 FINDINGS: Cardiac shadow is within normal limits. Postsurgical changes are noted. Elevation right hemidiaphragm is again seen with blunting of the right costophrenic angle stable from the prior exam. No focal infiltrate or sizable effusion is seen. No bony abnormality is noted. IMPRESSION: Chronic changes without acute abnormality. Electronically Signed   By: Inez Catalina M.D.   On: 11/28/2017 11:32    Old chart reviewed  Case discussed with Dr. Lacinda Axon in the ED  Chest x-ray reviewed no edema or infiltrate   Assessment/Plan 77 year old male with sepsis from postop infection at left groin area assistant with cellulitis and possible abscess Principal Problem:   post op Wound infection-place patient on IV vancomycin and Zosyn.  Blood cultures obtained.  Patient be transferred to [facility] for vascular evaluation by Dr. Bridgett Larsson.  Surgery was performed by Dr. early last week.  Keeping patient n.p.o. and hold anticoagulants at this time until seen by vascular surgery.  Active Problems:   Sepsis (HCC)-patient's blood pressure has normalized after 2 L of IV fluids.  Continue Avelox as above.  Lactic acid was initially around 3 trending down.  Continue to trend lactic acid level to normalizes every 3 hours.    S/P CABG x 4-noted stable    Essential  hypertension-noted stable holding all home meds at this time due to above    COPD (chronic obstructive pulmonary disease) (HCC)-stable continue nebulizer treatments and albuterol inhaler as needed    Coronary artery disease involving coronary bypass graft of native heart with angina pectoris (HCC)-stable    Chronic diastolic heart failure (HCC)-compensated and stable at this time    Bipolar I disorder (HCC)-stable   PAD (peripheral artery disease) (HCC)-vascular has been consulted patient being transferred to [facility] for evaluation     DVT prophylaxis: SCDs Code Status: Full Family Communication: Wife and sister-in-law Disposition Plan: Per day team Consults called: Vascular surgery Admission status: Admission   Sol Englert A MD [facility]  If 7PM-7AM, please contact night-coverage [email or web address] Password [email or web address]  11/28/2017, 1:32 PM

## 2017-11-28 NOTE — ED Notes (Signed)
Attempted to call report to receiving unit. Receiving nurse to call back for report.

## 2017-11-28 NOTE — Anesthesia Procedure Notes (Signed)
Procedure Name: Intubation Date/Time: 11/28/2017 7:11 PM Performed by: Oletta Lamas, CRNA Pre-anesthesia Checklist: Patient identified, Emergency Drugs available, Suction available and Patient being monitored Patient Re-evaluated:Patient Re-evaluated prior to induction Oxygen Delivery Method: Circle System Utilized Preoxygenation: Pre-oxygenation with 100% oxygen Induction Type: IV induction, Rapid sequence and Cricoid Pressure applied Laryngoscope Size: Mac and 3 Grade View: Grade I Tube type: Oral Number of attempts: 1 Airway Equipment and Method: Stylet and Oral airway Placement Confirmation: ETT inserted through vocal cords under direct vision,  positive ETCO2 and breath sounds checked- equal and bilateral Secured at: 23 cm Tube secured with: Tape Dental Injury: Teeth and Oropharynx as per pre-operative assessment

## 2017-11-28 NOTE — Progress Notes (Signed)
Pharmacy Antibiotic Note  John Ball is a 77 y.o. male admitted on 11/28/2017 with postop wound infection, s/p most recent vascular surgery on 11/17/17.  Started outpatient Keflex on 11/27/17.  Pharmacy has been consulted for Vancomycin and Zosyn dosing.  Today, 11/28/2017: AKI, SCr 2.15 (baseline SCr 0.9-1) WBC 26.2 Afebrile Lactic acid: 2. > 1.98  Plan:  Zosyn 3.375g IV Q8H infused over 4hrs.  Vancomycin 2g IV loading dose (1g+1g) followed by 1250 mg IV q36h.   Check vancomycin levels if remains on vancomycin > 3-4 days.  Goal AUC 400-500.  Follow up renal fxn, culture results, and clinical course.  F/u ability to de-escalate antibiotics.    No data recorded.  Recent Labs  Lab 11/28/17 1116 11/28/17 1122 11/28/17 1302  WBC 26.2*  --   --   CREATININE 2.15*  --   --   LATICACIDVEN  --  2.80* 1.98*    Estimated Creatinine Clearance: 35.9 mL/min (A) (by C-G formula based on SCr of 2.15 mg/dL (H)).    Allergies  Allergen Reactions  . Aripiprazole Other (See Comments)    Altered mental status  . Aspirin Other (See Comments)    Avoids due to severe bruises  . Crestor [Rosuvastatin Calcium] Other (See Comments)    myalgia  . Effexor [Venlafaxine Hydrochloride] Itching  . Lipitor [Atorvastatin Calcium] Other (See Comments)    myalgia  . Morphine And Related Itching  . Zetia [Ezetimibe] Other (See Comments)    myalgia  . Zocor [Simvastatin - High Dose] Other (See Comments)    myalgia    Antimicrobials this admission: 5/24 Vancomycin >> 5/24 Zosyn >>   Dose adjustments this admission:   Microbiology results: 5/24 BCx:   Thank you for allowing pharmacy to be a part of this patient's care.  Gretta Arab PharmD, BCPS Pager [phone] 11/28/2017 2:05 PM

## 2017-11-28 NOTE — Anesthesia Preprocedure Evaluation (Signed)
Anesthesia Evaluation  Patient identified by MRN, date of birth, ID band Patient awake    Reviewed: Allergy & Precautions, NPO status , Patient's Chart, lab work & pertinent test results  Airway Mallampati: I  TM Distance: >3 FB Neck ROM: Full    Dental   Pulmonary sleep apnea , COPD, Current Smoker,    Pulmonary exam normal        Cardiovascular hypertension, Pt. on medications + CAD and + CABG  Normal cardiovascular exam     Neuro/Psych Anxiety Depression Bipolar Disorder    GI/Hepatic   Endo/Other    Renal/GU      Musculoskeletal   Abdominal   Peds  Hematology   Anesthesia Other Findings   Reproductive/Obstetrics                             Anesthesia Physical Anesthesia Plan  ASA: III  Anesthesia Plan: General   Post-op Pain Management:    Induction: Intravenous  PONV Risk Score and Plan: 1 and Ondansetron and Treatment may vary due to age or medical condition  Airway Management Planned: Oral ETT  Additional Equipment:   Intra-op Plan:   Post-operative Plan: Extubation in OR  Informed Consent: I have reviewed the patients History and Physical, chart, labs and discussed the procedure including the risks, benefits and alternatives for the proposed anesthesia with the patient or authorized representative who has indicated his/her understanding and acceptance.     Plan Discussed with: CRNA and Surgeon  Anesthesia Plan Comments:         Anesthesia Quick Evaluation

## 2017-11-28 NOTE — Progress Notes (Signed)
Received report from PACU RN Diane. Patient arrived back on the unit to 4E-27 after left groin exploratory surgery. CCMD made aware. Patient given CHG bath and shift assessment done. Wound vac on left groin at 12mmHg. Patient has Foley catheter. A&O x4. Nurse informed me that patient's left dorsalis pedis was not dopplerable and MD aware. Tried to doppler that pulse myself without success. Patient resting comfortable, reporting no pain. Call light within reach and family at bedside. Will continue to monitor. Lajoyce Corners, RN

## 2017-11-28 NOTE — Progress Notes (Signed)
A consult was received from an ED physician for Vancomycin and Zosyn per pharmacy dosing.  The patient's profile has been reviewed for ht/wt/allergies/indication/available labs.   A one time order has been placed for Vancomycin 1g IV and Zosyn 3.375g IV.   Further antibiotics/pharmacy consults should be ordered by admitting physician if indicated.                       Thank you, Leeroy Bock 11/28/2017  11:13 AM

## 2017-11-28 NOTE — Op Note (Signed)
    OPERATIVE NOTE   PROCEDURE: 1. Left groin incision and drainage 2. Placement of negative pressure dressing  PRE-OPERATIVE DIAGNOSIS: infected left groin  POST-OPERATIVE DIAGNOSIS: same as above   SURGEON: Adele Barthel, MD  ANESTHESIA: general  ESTIMATED BLOOD LOSS: 50 cc  FINDING(S): 1.  Collection of pus superficial to patched artery 2.  Liquified and necrotic fat superficial to patched artery 3.  Viable subcutaneous tissue superficial to patched artery 4.  No obvious patched artery in wound  SPECIMEN(S):  Anaerobic and aerobic cultures of abscess cavity.  INDICATIONS:   John Ball is a 77 y.o. male who presents with fever, chills, and hypotension after a left iliofemoral endarterectomy with Dacron patch angioplasty and extended profundaplasty.  The patient exam was consistent with likely infected seroma vs abscess in left groin.  I recommended: exploration of the left groin and other indicated procedures.  The patient is aware the risks include but are not limited to: bleeding, infection, myocardial infarction, stroke, limb loss, nerve damage, limb edema, need for additional procedures in the future, wound complications, and inability to complete the arterial reconstruction.  The patient is aware of these risks and agreed to proceed.   DESCRIPTION: After obtaining full informed written consent, the patient was brought back to the operating room and placed supine upon the operating table.  The patient received IV antibiotics prior to induction.  A procedure time out was completed and the correct surgical site was verified.  After obtaining adequate anesthesia, the patient was prepped and draped in the standard fashion for: left groin exploration.  I bluntly removed the Dermabond.  I then transected the subcuticular sutures.  Almost immediately, some pus started draining from the inferior portion of the incision with the majority in the superficial subcutaneous tissue more  superior in the surgical wound.  There was necrotic fat evident in the wound.  I took anaerobic and aerobic cultures and then washed out the wound with 3 L of sterile normal saline with a Pulsavac.  This mechanically debrided most of the necrotic fat.  The residual tissue in the floor of this wound appeared viable.  I could not see the patch artery and could see the suture lines intact for the more deep closure.  There was no frank drainage from the deeper layer, so I elected to defer further exploration.   I fashioned a VAC sponge for the dimensions of the surgical incision.  I affixed this sponge with adhesive strips.  A hole was cut in the middle of the adhesive strips and the lilypad attached.  The VAC tubing was connected to the VAC pump at 125 continuous.  The VAC dressing became adherent without an air leak.   COMPLICATIONS: none  CONDITION: stable   Adele Barthel, MD, [facility] [place] Office: [phone] Pager: [phone]  11/28/2017, 8:07 PM

## 2017-11-28 NOTE — ED Triage Notes (Signed)
Pt had vascular surgery last week. Had follow up appt with them yesterday and was put on antibiotics for infection at surgical site.  Pt wear O2 continuously but wife states that patient slept on couch last night without O2 and today had unsteady gait and feels like going to fall.

## 2017-11-28 NOTE — Progress Notes (Signed)
Received patient via stretcher. He is groggy and has chills. Oriented patient and wife to room and call bell. CHG bath given.

## 2017-11-28 NOTE — ED Provider Notes (Signed)
[facility] DEPT Provider Note   CSN: [id number] Arrival date & time: 11/28/17  1043     History   Chief Complaint Chief Complaint  Patient presents with  . Gait Problem    HPI John Ball is a 77 y.o. male.  Level 5 caveat for urgent need for intervention.  Patient is status post left femoral endarterectomy with patch angioplasty on 11/17/2017.  He had an office visit yesterday for concern about infection at the surgical site in the left femoral area and was placed on antibiotics for potential infection.  Overnight he has become weaker, decreased energy, trouble walking.  No fever, sweats, chills.     Past Medical History:  Diagnosis Date  . Anxiety   . Arthritis   . Complication of anesthesia    hx prolonged post op oxygen dependent/  hx hallucinations for 2 day post op lobectomy 2012  . COPD, severe ([place])   . Depression   . Essential hypertension   . Hearing loss of both ears    does wear his hearing aids  . History of arterial bypass of lower extremity    RIGHT FEM-POP  . History of CHF (congestive heart failure)   . History of DVT of lower extremity   . History of panic attacks   . Hyperlipidemia with target LDL less than 70   . Left main coronary artery disease 2004   a) Severe LM CAD 2004 -->s/p  cabg x4 (LIMA-LAD, free RIMA-RI, SVG-OM 2, SVG-RPDA); b) 06/2014: Abnormal Myoview --> c)cardiac Cath Jan 2016: pLM 70%, pLAD 40-50%- patent LIMA-LAD.  RI -competitive flow noted from RIMA-RI graft, circumflex, normal caliber with moderate OM1.  OM 2 occluded.  SVG-OM 2 patent. RCA CTO after large RV M, faint R-R and LAD septal-PDA collaterals =--> new  . Neuropathy, peripheral   . 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 Nocturia   . OSA (obstructive sleep apnea)    uses O2  via [place]  --  intolerate to CPAP  . Peripheral arterial occlusive disease ([place])    2011   a) Gore-Tex graft right AK popA-BK popA --> b) 2012: thrombosed  graft --> thrombectomy with dacron patch = 1 V runnof via Peroneal; c) 01/2014: R femoropopliteal bypass with left femoral vein;; followed by dr Early--  per last dopplers 06-28-2014 no change right graft,  50-74% stenosis common and left mid superficial femoral artery  . PTSD (post-traumatic stress disorder)    anxiety attack's---  [place] [employer]  . Recurrent productive cough    SMOKER'S COUGH  . 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  Medical management  . Smoker unmotivated to quit    Reportedly he came close to having a nervous breakdown when he last tried to quit  . 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  Wears glasses     Patient Active Problem List   Diagnosis Date Noted  . PAD (peripheral artery disease) ([place]) 11/17/2017  . CAP (community acquired pneumonia) 09/22/2017  . SOB (shortness of breath) 09/12/2017  . PTSD (post-traumatic stress disorder) 09/10/2017  . OSA (obstructive sleep apnea) 09/10/2017  . No natural teeth 09/10/2017  . Hearing loss of both ears 09/10/2017  . Atypical angina ([place]) 08/28/2017  . Radial artery occlusion, left ([place]) 08/28/2017  . Bipolar I disorder ([place]) 05/07/2017  . COPD exacerbation ([place]) 07/21/2016  . Acute on chronic respiratory failure with hypoxia ([place]) 07/21/2016  . Chronic diastolic heart failure ([place]) 07/21/2016  . Major depression, recurrent ([place]) 07/21/2016  . Abnormal stress test     . Coronary artery disease involving coronary bypass graft of native heart with angina pectoris ([place])   . Chest pain at rest 06/06/2014  . Peripheral arterial occlusive disease ([place])   . Hyperlipidemia with target LDL less than 70   . Smoker unmotivated to quit   . Essential hypertension   . COPD (chronic obstructive pulmonary disease) ([place])   . Pain of left lower extremity 02/01/2014  . Lung cancer ([place]) 05/15/2011  . Squamous cell lung cancer ([place]) 05/05/2011  . Left main coronary artery disease 07/08/2002  . 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/2002    Past Surgical History:  Procedure Laterality Date  . ABDOMINAL AORTOGRAM N/A 11/12/2017   Procedure: ABDOMINAL AORTOGRAM;  Surgeon: Waynetta Sandy, MD;  Location: [facility];  Service: Cardiovascular;  Laterality: N/A;  . CARDIAC CATHETERIZATION  07-13-2002   Ischemia RCA region on Cardiolite// 60-70% ostial left main, 50% midCx, 60-70% mid RI,  70% mRCA, 90% JeNu and crux 75% RCA ,  95% PLA//  severe LM and 3Vessel CAD, perserved LV, ef 60%  . CARDIOVASCULAR STRESS TEST  last one  06-17-2014   dr  Ellyn Hack   Carlton Adam study with no exercise; Intermediate Risk Study;   moderate size and intensity, partially reversible inferior septal defect consistent with prior infarct and mild to moderate peri-infarct ischemia;  mild hypokinesis of the inferior septal wall,  normal LV function, ef 56%  . COLONOSCOPY  2008    WNL  . CORONARY ARTERY BYPASS GRAFT  07/27/2002   LIMA-LAD, freeRIMA-RI,SVG-OM2, SVG-rPDA; Dr. Roxan Hockey  . CYSTOSCOPY W/ URETERAL STENT PLACEMENT N/A 02/21/2015   Procedure: BILATERAL RETROGRADE PYELOGRAM;  Surgeon: Festus Aloe, MD;  [facility];  Service: Urology;  Laterality: N/A;  . ENDARTERECTOMY FEMORAL Left 11/17/2017   Procedure: LEFT Femoral Endarterectomy with Patch Angioplasty;  Surgeon: Rosetta Posner, MD;  Location: [facility];  Service: Vascular;  Laterality: Left;  . EYE SURGERY Left Nov 2013   . FEMORAL ARTERY - POPLITEAL ARTERY BYPASS GRAFT Right 10-24-2008   dr early   w/ GoreTex graft  . FEMORAL-TIBIAL BYPASS GRAFT Right 01/10/2014   Procedure: Right Femoral to Posterior Tibial Bypass Graft using Left Leg Vein, Thrombectomy Right Common Femoral and Profunda of Leg . ;  Surgeon: Rosetta Posner, MD;  Location: [facility];  Service: Vascular;  Laterality: Right;  . HYDROCELE EXCISION Left   . LEFT ANKLE SURGERY  1990's  . LEFT HEART CATHETERIZATION WITH CORONARY/GRAFT ANGIOGRAM N/A 07/12/2014   Procedure: LEFT HEART CATHETERIZATION WITH Beatrix Fetters;  Surgeon: Troy Sine, MD;  Location: [facility] C[facility] LAB;  Service: Cardiovascular;  Laterality: N/A;  pLM 70%, pLAD 40-50%- patent LIMA-LAD.  RI -competitive flow noted from fRIMA-RI graft, circumflex, normal caliber with  moderate OM1.  OM 2 occluded.  SVG-OM 2 patent. RCA CTO after large RVM, faint R-R and LAD septal-PDA collateral  . LOWER EXTREMITY ANGIOGRAPHY Bilateral 11/12/2017   Procedure: LOWER EXTREMITY ANGIOGRAPHY - Left Lower;  Surgeon: Waynetta Sandy, MD;  Location: [facility];  Service: Cardiovascular;  Laterality: Bilateral;  . NM MYOVIEW LTD  05/2014   Moderate sized, moderate intensity/partially reversible inferior defect consistent with prior infarct mild/moderate peri-infarct ischemia.  Inferoseptal HK.  INTERMEDIATE RISK. --> Cath showed CTO of Native RCA & SVG-rPDA.  Marland Kitchen RIGHT ABOVE KNEE POPLITEAL GRAFT TO BELOW KNEE POPLITEAL ARTERY BYPASS WITH REVERSE SAPHENOUS VEIN  03-02-2010  . THROMBECTOMY OF RIGHT FEMORAL TO ABOVE KNEE POPLITEAL GORETEX GRAFT ANGIOPLASTY OF GORETEX AND SAPHENOUS VEIN JUNCTION AND ABOVE KNEE POPLITEAL ARTERY   10-06-2010   DR EARLY  . TONSILLECTOMY    . TRANSURETHRAL RESECTION OF BLADDER TUMOR N/A 02/21/2015   Procedure: TRANSURETHRAL RESECTION OF BLADDER TUMOR (TURBT);  Surgeon: Festus Aloe, MD;  Location: [facility];  Service: Urology;  Laterality: N/A;  . VIDEO  ASSISTED THORACOSCOPY (VATS)/WEDGE RESECTION Right 05-01-2011    FORSYTH   LOWER LOBECTOMY W/  NODE DISSECTION        Home Medications    Prior to Admission medications   Medication Sig Start Date End Date Taking? Authorizing Provider  acetaminophen (TYLENOL) 500 MG tablet Take 1,000 mg by mouth every 8 (eight) hours as needed.   Yes [provider]  ADVAIR DISKUS 500-50 MCG/DOSE AEPB Inhale 1 puff into the lungs 2 (two) times daily.  03/03/17  Yes [provider]  albuterol (PROVENTIL HFA;VENTOLIN HFA) 108 (90 BASE) MCG/ACT inhaler Inhale 1 puff into the lungs every 6 (six) hours as needed for wheezing or shortness of breath.   Yes [provider]  albuterol (PROVENTIL) (5 MG/ML) 0.5% nebulizer solution Take 2.5 mg by nebulization every 6 (six) hours as needed for wheezing or shortness of breath.   Yes [provider]  cephALEXin (KEFLEX) 500 MG capsule Take 1 capsule (500 mg total) by mouth 3 (three) times daily. 11/27/17  Yes Nickel, Sharmon Leyden, NP  Cholecalciferol (VITAMIN D3) 1000 UNITS CAPS Take 1 capsule by mouth every morning.   Yes [provider]  clonazePAM (KLONOPIN) 1 MG tablet Take 1 tablet by mouth 2 (two) times daily. 11/01/17  Yes [provider]  DULoxetine (CYMBALTA) 30 MG capsule Take 30-60 mg by mouth See admin instructions. 60mg  (2 capsules) every morning and 30mg  (1 capsule) daily at evening   Yes [provider]  gabapentin (NEURONTIN) 100 MG capsule Take 100 mg by mouth every 8 (eight) hours.    Yes [provider]  oxyCODONE (OXY IR/ROXICODONE) 5 MG immediate release tablet Take 1 tablet (5 mg total) by mouth every 6 (six) hours as needed for moderate pain. 11/18/17  Yes Ulyses Amor, PA-C  OXYGEN Inhale 2 L into the lungs at bedtime. Uses O2 -- intolerant to CPAP   Yes [provider]  prazosin (MINIPRESS) 1 MG capsule Take 1 mg by mouth at bedtime.   Yes [provider]    QUEtiapine (SEROQUEL XR) 300 MG 24 hr tablet Take 300 mg by mouth at bedtime.    Yes [provider]  ramipril (ALTACE) 2.5 MG capsule Take 2.5 mg by mouth every morning.    Yes [provider]  tiotropium (SPIRIVA HANDIHALER) 18 MCG inhalation capsule Place 18 mcg into inhaler and inhale daily.  Yes [provider]  traZODone (DESYREL) 150 MG tablet Take 0.5 tablets (75 mg total) by mouth at bedtime as needed for sleep. Patient taking differently: Take 150 mg by mouth at bedtime.  09/13/17  Yes Florencia Reasons, MD  zolpidem (AMBIEN CR) 12.5 MG CR tablet Take 12.5 mg by mouth at bedtime.     Yes [provider]  nitroGLYCERIN (NITROSTAT) 0.4 MG SL tablet Place 1 tablet (0.4 mg total) under the tongue every 5 (five) minutes as needed for chest pain. 08/28/17   Leonie Man, MD    Family History Family History  Problem Relation Age of Onset  . Other Mother 46       Abdominal Aortic Aneurysm   . Stroke Mother   . Alzheimer's disease Father 20  . Stroke Father     Social History Social History   Tobacco Use  . Smoking status: Current Every Day Smoker    Packs/day: 0.50    Years: 55.00    Pack years: 27.50    Types: Cigarettes  . Smokeless tobacco: Never Used  Substance Use Topics  . Alcohol use: No  . Drug use: No     Allergies   Aripiprazole; Aspirin; Crestor [rosuvastatin calcium]; Effexor [venlafaxine hydrochloride]; Lipitor [atorvastatin calcium]; Morphine and related; Zetia [ezetimibe]; and Zocor [simvastatin - high dose]   Review of Systems Review of Systems  All other systems reviewed and are negative.    Physical Exam Updated Vital Signs BP (!) 86/62 (BP Location: Right Arm)   Pulse 85   Resp (!) 26   SpO2 98%   Physical Exam  Constitutional: He is oriented to person, place, and time.  Alert, interactive  HENT:  Head: Normocephalic and atraumatic.  Eyes: Conjunctivae are normal.  Neck: Neck supple.  Cardiovascular: Normal  rate and regular rhythm.  Pulmonary/Chest: Effort normal and breath sounds normal.  Abdominal: Soft. Bowel sounds are normal.  Musculoskeletal: Normal range of motion.  Neurological: He is alert and oriented to person, place, and time.  Skin:  Left inguinal area: Area is erythematous with induration approximately 5 x 2 cm's.  Clear yellowish drainage.  Psychiatric:  Flat affect  Nursing note and vitals reviewed.    ED Treatments / Results  Labs (all labs ordered are listed, but only abnormal results are displayed) Labs Reviewed  COMPREHENSIVE METABOLIC PANEL - Abnormal; Notable for the following components:      Result Value   Chloride 100 (*)    Glucose, Bld 182 (*)    BUN 35 (*)    Creatinine, Ser 2.15 (*)    Calcium 8.8 (*)    GFR calc non Af Amer 29 (*)    GFR calc Af Amer 33 (*)    All other components within normal limits  CBC WITH DIFFERENTIAL/PLATELET - Abnormal; Notable for the following components:   WBC 26.2 (*)    RBC 3.48 (*)    Hemoglobin 11.8 (*)    HCT 36.2 (*)    MCV 104.0 (*)    Neutro Abs 24.7 (*)    All other components within normal limits  I-STAT CG4 LACTIC ACID, ED - Abnormal; Notable for the following components:   Lactic Acid, Venous 2.80 (*)    All other components within normal limits  CULTURE, BLOOD (ROUTINE X 2)  CULTURE, BLOOD (ROUTINE X 2)  URINALYSIS, ROUTINE W REFLEX MICROSCOPIC  I-STAT CG4 LACTIC ACID, ED    EKG EKG Interpretation  Date/Time:  Friday Nov 28 2017 11:12:41  EDT Ventricular Rate:  80 PR Interval:    QRS Duration: 106 QT Interval:  418 QTC Calculation: 483 R Axis:   23 Text Interpretation:  Sinus rhythm Borderline repolarization abnormality Borderline prolonged QT interval Confirmed by Nat Christen [id number]) on 11/28/2017 11:27:43 AM   Radiology Dg Chest Port 1 View  Result Date: 11/28/2017 CLINICAL DATA:  Sepsis EXAM: PORTABLE CHEST 1 VIEW COMPARISON:  09/22/2017 FINDINGS: Cardiac shadow is within normal limits.  Postsurgical changes are noted. Elevation right hemidiaphragm is again seen with blunting of the right costophrenic angle stable from the prior exam. No focal infiltrate or sizable effusion is seen. No bony abnormality is noted. IMPRESSION: Chronic changes without acute abnormality. Electronically Signed   By: Inez Catalina M.D.   On: 11/28/2017 11:32    Procedures Procedures (including critical care time)  Medications Ordered in ED Medications  vancomycin (VANCOCIN) IVPB 1000 mg/200 mL premix (1,000 mg Intravenous New Bag/Given 11/28/17 1201)  piperacillin-tazobactam (ZOSYN) IVPB 3.375 g (0 g Intravenous Stopped 11/28/17 1204)  sodium chloride 0.9 % bolus 2,700 mL (2,700 mLs Intravenous New Bag/Given 11/28/17 1132)     Initial Impression / Assessment and Plan / ED Course  I have reviewed the triage vital signs and the nursing notes.  Pertinent labs & imaging results that were available during my care of the patient were reviewed by me and considered in my medical decision making (see chart for details).     Patient presents with weakness, fatigue, infection in the left inguinal soft tissue.  Sepsis protocol initiated.  IV fluids, IV pain, IV Zosyn.  Discussed with vascular surgeon Dr.Chen and hospitalist Dr Shanon Brow.  Will transfer to [facility].   CRITICAL CARE Performed by: Nat Christen Total critical care time: 35 minutes Critical care time was exclusive of separately billable procedures and treating other patients. Critical care was necessary to treat or prevent imminent or life-threatening deterioration. Critical care was time spent personally by me on the following activities: development of treatment plan with patient and/or surrogate as well as nursing, discussions with consultants, evaluation of patient's response to treatment, examination of patient, obtaining history from patient or surrogate, ordering and performing treatments and interventions, ordering and review of laboratory  studies, ordering and review of radiographic studies, pulse oximetry and re-evaluation of patient's condition.  Final Clinical Impressions(s) / ED Diagnoses   Final diagnoses:  Sepsis, due to unspecified organism [facility])    ED Discharge Orders    None       Nat Christen, MD 11/28/17 1254

## 2017-11-28 NOTE — Anesthesia Postprocedure Evaluation (Signed)
Anesthesia Post Note  Patient: John Ball  Procedure(s) Performed: WOUND EXPLORATION GROIN, WASHOUT AND WOUND VAC APPLICATION (Left )     Patient location during evaluation: PACU Anesthesia Type: General Level of consciousness: awake and alert Pain management: pain level controlled Vital Signs Assessment: post-procedure vital signs reviewed and stable Respiratory status: spontaneous breathing, nonlabored ventilation, respiratory function stable and patient connected to nasal cannula oxygen Cardiovascular status: blood pressure returned to baseline and stable Postop Assessment: no apparent nausea or vomiting Anesthetic complications: no    Last Vitals:  Vitals:   11/28/17 2030 11/28/17 2101  BP: 111/63 116/68  Pulse: 89 95  Resp: 17 20  Temp: 37.2 C 36.7 C  SpO2: 96% 94%    Last Pain:  Vitals:   11/28/17 2101  TempSrc: Oral  PainSc:                  Zamier Eggebrecht DAVID

## 2017-11-28 NOTE — ED Notes (Signed)
Bed: TP12 Expected date:  Expected time:  Means of arrival:  Comments: Hold forRes B

## 2017-11-29 ENCOUNTER — Encounter (HOSPITAL_COMMUNITY): Payer: Self-pay | Admitting: Vascular Surgery

## 2017-11-29 LAB — GLUCOSE, CAPILLARY
GLUCOSE-CAPILLARY: 146 mg/dL — AB (ref 65–99)
GLUCOSE-CAPILLARY: 154 mg/dL — AB (ref 65–99)

## 2017-11-29 LAB — BASIC METABOLIC PANEL
ANION GAP: 10 (ref 5–15)
BUN: 27 mg/dL — ABNORMAL HIGH (ref 6–20)
CO2: 24 mmol/L (ref 22–32)
Calcium: 8.3 mg/dL — ABNORMAL LOW (ref 8.9–10.3)
Chloride: 105 mmol/L (ref 101–111)
Creatinine, Ser: 1.23 mg/dL (ref 0.61–1.24)
GFR calc Af Amer: >60 mL/min (ref >60)
GFR, EST NON AFRICAN AMERICAN: 56 mL/min — AB (ref >60)
Glucose, Bld: 167 mg/dL — ABNORMAL HIGH (ref 65–99)
POTASSIUM: 4.3 mmol/L (ref 3.5–5.1)
SODIUM: 139 mmol/L (ref 135–145)

## 2017-11-29 MED ORDER — MOMETASONE FURO-FORMOTEROL FUM 200-5 MCG/ACT IN AERO
2.0000 | INHALATION_SPRAY | Freq: Two times a day (BID) | RESPIRATORY_TRACT | Status: DC
  Administered 2017-11-29 – 2017-12-05 (×10): 2 via RESPIRATORY_TRACT
  Filled 2017-11-29: qty 8.8

## 2017-11-29 MED ORDER — NICOTINE 21 MG/24HR TD PT24
21.0000 mg | MEDICATED_PATCH | Freq: Every day | TRANSDERMAL | Status: DC
  Administered 2017-11-29 – 2017-12-05 (×7): 21 mg via TRANSDERMAL
  Filled 2017-11-29 (×7): qty 1

## 2017-11-29 MED ORDER — ZOLPIDEM TARTRATE 5 MG PO TABS
10.0000 mg | ORAL_TABLET | Freq: Every evening | ORAL | Status: DC | PRN
  Administered 2017-11-29 – 2017-12-03 (×3): 10 mg via ORAL
  Filled 2017-11-29 (×3): qty 2

## 2017-11-29 MED ORDER — PROPOFOL 10 MG/ML IV BOLUS
INTRAVENOUS | Status: AC
  Filled 2017-11-29: qty 20

## 2017-11-29 MED ORDER — HEPARIN SODIUM (PORCINE) 5000 UNIT/ML IJ SOLN
5000.0000 [IU] | Freq: Three times a day (TID) | INTRAMUSCULAR | Status: DC
  Administered 2017-11-29 – 2017-12-05 (×18): 5000 [IU] via SUBCUTANEOUS
  Filled 2017-11-29 (×18): qty 1

## 2017-11-29 NOTE — Progress Notes (Signed)
Foley removed at 1100, pt due to void by 1700.  Pt ambulated approx 50" with front wheel walker, RN standby assist and 3L O2. Back to room, up to recliner.  Needing some time to catch his breath before reclining.  O2 sats 97% on RA, other VSS.  Call bell within reach and family at bedside.

## 2017-11-29 NOTE — Progress Notes (Addendum)
   Daily Progress Note   Assessment/Planning:   POD #1 s/p I&D L groin, VAC    Good response to I&D, pt already feeling better with stabilization of BP and temp curve  VAC: M-W-F  Awaiting wound C&S to guide antibiotics.  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   Subjective  - 1 Day Post-Op   Feeling better this AM   Objective   Vitals:   11/28/17 2030 11/28/17 2101 11/29/17 0015 11/29/17 0416  BP: 111/63 116/68 107/65 103/72  Pulse: 89 95 78 88  Resp: 17 20 (!) 22 19  Temp: 99 F (37.2 C) 98.1 F (36.7 C) 98.3 F (36.8 C) 98 F (36.7 C)  TempSrc:  Oral Axillary Axillary  SpO2: 96% 94% 99% 95%  Weight:      Height:         Intake/Output Summary (Last 24 hours) at 11/29/2017 0754 Last data filed at 11/29/2017 0600 Gross per 24 hour  Intake 5337.5 ml  Output 1260 ml  Net 4077.5 ml    PULM  BLL rales, [place] oxygen  CV  RRR  GI  soft, NTND  VASC L groin: VAC adherent, previous thigh and scrotal erythema resolved  NEURO Motor and sensation intact in L foot    Laboratory   CBC CBC Latest Ref Rng & Units 11/28/2017 11/18/2017 11/17/2017  WBC 4.0 - 10.5 K/uL 26.2(H) 7.6 7.5  Hemoglobin 13.0 - 17.0 g/dL 11.8(L) 11.5(L) 12.8(L)  Hematocrit 39.0 - 52.0 % 36.2(L) 35.9(L) 39.8  Platelets 150 - 400 K/uL 185 130(L) 139(L)    BMET    Component Value Date/Time   NA 137 11/28/2017 1116   NA 143 09/25/2017 1420   NA 139 05/02/2014 1156   K 4.2 11/28/2017 1116   K 4.1 05/02/2014 1156   CL 100 (L) 11/28/2017 1116   CO2 22 11/28/2017 1116   CO2 24 05/02/2014 1156   GLUCOSE 182 (H) 11/28/2017 1116   GLUCOSE 101 05/02/2014 1156   BUN 35 (H) 11/28/2017 1116   BUN 18 09/25/2017 1420   BUN 15.7 05/02/2014 1156   CREATININE 2.15 (H) 11/28/2017 1116   CREATININE 1.21 07/07/2014 1105   CREATININE 1.1 05/02/2014 1156   CALCIUM 8.8 (L) 11/28/2017 1116   CALCIUM 9.5 05/02/2014 1156   GFRNONAA 29 (L) 11/28/2017 1116   GFRAA 33 (L) 11/28/2017 1116     Adele Barthel, MD, FACS [facility] [place] Office: [phone] Pager: [phone]  11/29/2017, 7:54 AM

## 2017-11-29 NOTE — Progress Notes (Signed)
PROGRESS NOTE                                                                                                                                                                                                             Patient Demographics:    John Ball, is a 77 y.o. male, DOB - [birth year], [id number]  Admit date - 11/28/2017   Admitting Physician Phillips Grout, MD  Outpatient Primary MD for the patient is Mellody Dance, DO  LOS - 1   Chief Complaint  Patient presents with  . Gait Problem       Brief Narrative  77 y.o. male with medical history significant of COPD, CHF, DVT, coronary artery disease status post CABG, with recent ischemic left lower extremity status post common femoral profundus endarterectomy with Dacron patch angioplasty reimplantation of native superficial femoral artery into [place] patch by Dr. early on Nov 17, 2017 comes in with over 2 days of worsening swelling to the left groin area , found to have sepsis, status post I&D of left groin area by Dr. Bridgett Larsson.     Subjective:    John Ball today has, No headache, No chest pain, No abdominal pain   Assessment  & Plan :    Principal Problem:   post op Wound infection Active Problems:   S/P CABG x 4   Essential hypertension   COPD (chronic obstructive pulmonary disease) (HCC)   Coronary artery disease involving coronary bypass graft of native heart with angina pectoris (HCC)   Chronic diastolic heart failure (HCC)   Bipolar I disorder (HCC)   PAD (peripheral artery disease) ([place])   Sepsis ([place])   Sepsis secondary to post op Wound infection -Sepsis criteria met on admission, elevated lactic acid, hypotensive, leukocytosis - s/p external iliac, common femoral and profundus femoral artery endarterectomy with Dacron patch angioplasty and reimplantation of native superficial femoral artery into Dacron patch on 11/17/17. -Presents with wound infection, significant  leukocytosis, started on empiric IV antibiotic coverage vancomycin and Zosyn, blood culture so far with no growth to date, wound culture growing gram-positive cocci, continue with broad-spectrum antibiotics and will narrow once cultures are more available, likely will need to discuss with ID once cultures are available regarding antibiotic plan. -Status post incision and drainage by Dr. Bridgett Larsson yesterday  CAD status post CABG -He denies any  chest pain or shortness of breath  Hypertension - Given soft blood pressure continue to hold home medication  COPD -No active wheezing, continue with as needed neb treatment   Chronic diastolic heart failure ([place]) -compensated and stable at this time  Bipolar I disorder ([place]) -stable  PAD (peripheral artery disease) ([place]) -status post recent common femoral endarterectomy        Code Status : Full  Family Communication  : wife at bedside  Disposition Plan  : pending further work up.  Consults  : Vascular surgery  Procedures  : 1. Left groin incision and drainage 2. Placement of negative pressure dressing     DVT Prophylaxis  : Subcutaneous heparin  Lab Results  Component Value Date   PLT 185 11/28/2017    Antibiotics  :   Anti-infectives (From admission, onward)   Start     Dose/Rate Route Frequency Ordered Stop   11/29/17 2200  vancomycin (VANCOCIN) 1,250 mg in sodium chloride 0.9 % 250 mL IVPB     1,250 mg 166.7 mL/hr over 90 Minutes Intravenous Every 36 hours 11/28/17 1422     11/28/17 2000  piperacillin-tazobactam (ZOSYN) IVPB 3.375 g     3.375 g 12.5 mL/hr over 240 Minutes Intravenous Every 8 hours 11/28/17 1422     11/28/17 1415  vancomycin (VANCOCIN) IVPB 1000 mg/200 mL premix     1,000 mg 200 mL/hr over 60 Minutes Intravenous  Once 11/28/17 1408 11/28/17 1529   11/28/17 1115  piperacillin-tazobactam (ZOSYN) IVPB 3.375 g     3.375 g 100 mL/hr over 30 Minutes Intravenous  Once 11/28/17 1106 11/28/17 1204    11/28/17 1115  vancomycin (VANCOCIN) IVPB 1000 mg/200 mL premix     1,000 mg 200 mL/hr over 60 Minutes Intravenous  Once 11/28/17 1106 11/28/17 1309        Objective:   Vitals:   11/28/17 2101 11/29/17 0015 11/29/17 0416 11/29/17 1113  BP: 116/68 107/65 103/72 140/70  Pulse: 95 78 88 85  Resp: 20 (!) '22 19 18  ' Temp: 98.1 F (36.7 C) 98.3 F (36.8 C) 98 F (36.7 C)   TempSrc: Oral Axillary Axillary   SpO2: 94% 99% 95% 97%  Weight:      Height:        Wt Readings from Last 3 Encounters:  11/28/17 93.9 kg (207 lb)  11/27/17 93.9 kg (207 lb)  11/17/17 94.5 kg (208 lb 5.4 oz)     Intake/Output Summary (Last 24 hours) at 11/29/2017 1640 Last data filed at 11/29/2017 1300 Gross per 24 hour  Intake 2747.5 ml  Output 1460 ml  Net 1287.5 ml     Physical Exam  Awake Alert, Oriented X 3, No new F.N deficits, Normal affect [place].AT,PERRAL Supple Neck,No JVD, No cervical lymphadenopathy appriciated.  Symmetrical Chest wall movement, Good air movement bilaterally, CTAB RRR,No Gallops,Rubs or new Murmurs, No Parasternal Heave +ve B.Sounds, Abd Soft, No tenderness,  guarding or rigidity.  Groin wound with wound VAC No Cyanosis, Clubbing or edema, No new Rash or bruise     Data Review:    CBC Recent Labs  Lab 11/28/17 1116  WBC 26.2*  HGB 11.8*  HCT 36.2*  PLT 185  MCV 104.0*  MCH 33.9  MCHC 32.6  RDW 14.2  LYMPHSABS 1.0  MONOABS 0.5  EOSABS 0.0  BASOSABS 0.0    Chemistries  Recent Labs  Lab 11/28/17 1116 11/29/17 1342  NA 137 139  K 4.2 4.3  CL 100* 105  CO2 22 24  GLUCOSE 182* 167*  BUN 35* 27*  CREATININE 2.15* 1.23  CALCIUM 8.8* 8.3*  AST 39  --   ALT 20  --   ALKPHOS 71  --   BILITOT 0.7  --    ------------------------------------------------------------------------------------------------------------------ No results for input(s): CHOL, HDL, LDLCALC, TRIG, CHOLHDL, LDLDIRECT in the last 72 hours.  Lab Results  Component Value Date   HGBA1C  5.8 (H) 09/25/2017   ------------------------------------------------------------------------------------------------------------------ No results for input(s): TSH, T4TOTAL, T3FREE, THYROIDAB in the last 72 hours.  Invalid input(s): FREET3 ------------------------------------------------------------------------------------------------------------------ No results for input(s): VITAMINB12, FOLATE, FERRITIN, TIBC, IRON, RETICCTPCT in the last 72 hours.  Coagulation profile No results for input(s): INR, PROTIME in the last 168 hours.  No results for input(s): DDIMER in the last 72 hours.  Cardiac Enzymes No results for input(s): CKMB, TROPONINI, MYOGLOBIN in the last 168 hours.  Invalid input(s): CK ------------------------------------------------------------------------------------------------------------------    Component Value Date/Time   BNP 73.2 09/12/2017 1500    Inpatient Medications  Scheduled Meds: . clonazePAM  1 mg Oral BID  . DULoxetine  30 mg Oral Daily  . DULoxetine  60 mg Oral Daily  . gabapentin  100 mg Oral Q8H  . nicotine  21 mg Transdermal Daily  . QUEtiapine  300 mg Oral QHS   Continuous Infusions: . sodium chloride 75 mL/hr at 11/28/17 2300  . sodium chloride Stopped (11/28/17 2130)  . piperacillin-tazobactam (ZOSYN)  IV 3.375 g (11/29/17 1416)  . vancomycin     PRN Meds:.albuterol, oxyCODONE, traZODone, zolpidem  Micro Results Recent Results (from the past 240 hour(s))  Blood Culture (routine x 2)     Status: None (Preliminary result)   Collection Time: 11/28/17 11:11 AM  Result Value Ref Range Status   Specimen Description   Final    BLOOD RIGHT ANTECUBITAL Performed at [facility] [address] [place], [place] [address]    Special Requests   Final    BOTTLES DRAWN AEROBIC AND ANAEROBIC Blood Culture results may not be optimal due to an excessive volume of blood received in culture bottles Performed at [facility] [address] [place], [place] [address]    Culture   Final    NO GROWTH < 24 HOURS Performed at [facility] [address], [place], [place] [address]    Report Status PENDING  Incomplete  Blood Culture (routine x 2)     Status: None (Preliminary result)   Collection Time: 11/28/17 11:16 AM  Result Value Ref Range Status   Specimen Description   Final    BLOOD LEFT ANTECUBITAL Performed at [facility] [address] [place], [place] [address]    Special Requests   Final    BOTTLES DRAWN AEROBIC AND ANAEROBIC Blood Culture adequate volume Performed at [facility] [address], [place], [place] [address]    Culture   Final    NO GROWTH < 24 HOURS Performed at [facility] [address], [place],  [address]    Report Status PENDING  Incomplete  Aerobic/Anaerobic Culture (surgical/deep wound)     Status: None (Preliminary result)   Collection Time: 11/28/17  7:40 PM  Result Value Ref Range Status   Specimen Description WOUND LEFT GROIN  Final   Special Requests PATIENT ON VANCOMYCIN  Final   Gram Stain   Final    RARE WBC PRESENT, PREDOMINANTLY PMN RARE GRAM POSITIVE COCCI IN PAIRS  Culture   Final    TOO YOUNG TO READ Performed at [facility], [place] [address] [place], [place] [address]    Report Status PENDING  Incomplete    Radiology Reports Dg Chest Port 1 View  Result Date: 11/28/2017 CLINICAL DATA:  Sepsis EXAM: PORTABLE CHEST 1 VIEW COMPARISON:  09/22/2017 FINDINGS: Cardiac shadow is within normal limits. Postsurgical changes are noted. Elevation right hemidiaphragm is again seen with blunting of the right costophrenic angle stable from the prior exam. No focal infiltrate or sizable effusion is seen. No bony abnormality is noted. IMPRESSION: Chronic changes without acute abnormality. Electronically Signed   By: Inez Catalina M.D.   On: 11/28/2017 11:32     Phillips Climes M.D on 11/29/2017 at 4:40 PM  Between 7am to 7pm - Pager - [phone]  After 7pm go to [email or web address] - password [facility]  [facility] -  Office  [phone]

## 2017-11-30 LAB — CBC
HCT: 33.4 % — ABNORMAL LOW (ref 39.0–52.0)
HEMOGLOBIN: 10.4 g/dL — AB (ref 13.0–17.0)
MCH: 32.8 pg (ref 26.0–34.0)
MCHC: 31.1 g/dL (ref 30.0–36.0)
MCV: 105.4 fL — AB (ref 78.0–100.0)
Platelets: 192 10*3/uL (ref 150–400)
RBC: 3.17 MIL/uL — AB (ref 4.22–5.81)
RDW: 14.1 % (ref 11.5–15.5)
WBC: 10.2 10*3/uL (ref 4.0–10.5)

## 2017-11-30 LAB — BASIC METABOLIC PANEL
ANION GAP: 5 (ref 5–15)
BUN: 26 mg/dL — ABNORMAL HIGH (ref 6–20)
CHLORIDE: 106 mmol/L (ref 101–111)
CO2: 25 mmol/L (ref 22–32)
Calcium: 8.2 mg/dL — ABNORMAL LOW (ref 8.9–10.3)
Creatinine, Ser: 1 mg/dL (ref 0.61–1.24)
GFR calc non Af Amer: >60 mL/min (ref >60)
Glucose, Bld: 110 mg/dL — ABNORMAL HIGH (ref 65–99)
Potassium: 4.3 mmol/L (ref 3.5–5.1)
Sodium: 136 mmol/L (ref 135–145)

## 2017-11-30 LAB — CBC WITH DIFFERENTIAL/PLATELET
Abs Immature Granulocytes: 0.1 10*3/uL (ref 0.0–0.1)
Basophils Absolute: 0 10*3/uL (ref 0.0–0.1)
Basophils Relative: 0 %
Eosinophils Absolute: 0.1 10*3/uL (ref 0.0–0.7)
Eosinophils Relative: 1 %
HEMATOCRIT: 34.2 % — AB (ref 39.0–52.0)
Hemoglobin: 10.7 g/dL — ABNORMAL LOW (ref 13.0–17.0)
Immature Granulocytes: 1 %
LYMPHS ABS: 1.7 10*3/uL (ref 0.7–4.0)
LYMPHS PCT: 17 %
MCH: 33.2 pg (ref 26.0–34.0)
MCHC: 31.3 g/dL (ref 30.0–36.0)
MCV: 106.2 fL — ABNORMAL HIGH (ref 78.0–100.0)
MONO ABS: 0.3 10*3/uL (ref 0.1–1.0)
Monocytes Relative: 3 %
Neutro Abs: 8.2 10*3/uL — ABNORMAL HIGH (ref 1.7–7.7)
Neutrophils Relative %: 78 %
Platelets: 185 10*3/uL (ref 150–400)
RBC: 3.22 MIL/uL — ABNORMAL LOW (ref 4.22–5.81)
RDW: 14.1 % (ref 11.5–15.5)
WBC: 10.5 10*3/uL (ref 4.0–10.5)

## 2017-11-30 LAB — VANCOMYCIN, RANDOM: Vancomycin Rm: 10

## 2017-11-30 MED ORDER — PRAZOSIN HCL 1 MG PO CAPS
1.0000 mg | ORAL_CAPSULE | Freq: Every day | ORAL | Status: DC
  Administered 2017-11-30 – 2017-12-04 (×5): 1 mg via ORAL
  Filled 2017-11-30 (×5): qty 1

## 2017-11-30 MED ORDER — VANCOMYCIN HCL IN DEXTROSE 1-5 GM/200ML-% IV SOLN
1000.0000 mg | Freq: Two times a day (BID) | INTRAVENOUS | Status: DC
  Administered 2017-11-30 – 2017-12-04 (×8): 1000 mg via INTRAVENOUS
  Filled 2017-11-30 (×12): qty 200

## 2017-11-30 NOTE — Progress Notes (Signed)
PROGRESS NOTE                                                                                                                                                                                                             Patient Demographics:    John Ball, is a 77 y.o. male, DOB - 1941-03-29, RXV:[id number]  Admit date - 11/28/2017   Admitting Physician Phillips Grout, MD  Outpatient Primary MD for the patient is Mellody Dance, DO  LOS - 2   Chief Complaint  Patient presents with  . Gait Problem       Brief Narrative  77 y.o. male with medical history significant of COPD, CHF, DVT, coronary artery disease status post CABG, with recent ischemic left lower extremity status post common femoral profundus endarterectomy with Dacron patch angioplasty reimplantation of native superficial femoral artery into d[place] patch by Dr. early on Nov 17, 2017 comes in with over 2 days of worsening swelling to the left groin area , found to have sepsis, status post I&D of left groin area by Dr. Bridgett Larsson.     Subjective:    John Ball today has, No headache, No chest pain, No abdominal pain   Assessment  & Plan :    Principal Problem:   post op Wound infection Active Problems:   S/P CABG x 4   Essential hypertension   COPD (chronic obstructive pulmonary disease) (HCC)   Coronary artery disease involving coronary bypass graft of native heart with angina pectoris (HCC)   Chronic diastolic heart failure (HCC)   Bipolar I disorder (HCC)   PAD (peripheral artery disease) ([place])   Sepsis ([place])   Sepsis secondary to post op Wound infection -Sepsis criteria met on admission, elevated lactic acid, hypotensive, leukocytosis - s/p external iliac, common femoral and profundus femoral artery endarterectomy with Dacron patch angioplasty and reimplantation of native superficial femoral artery into Dacron patch on 11/17/17. -Presents with wound infection, significant  leukocytosis, started on empiric IV antibiotic coverage vancomycin and Zosyn, blood culture so far with no growth to date, wound culture growing Staphylococcus aureus . -Status post incision and drainage by Dr. Bridgett Larsson of 2519, plan for wound VAC change tomorrow by vascular  CAD status post CABG -He denies any chest pain or shortness of breath  Hypertension - Blood pressure soft initially, medication has been  held, now started to increase, I will resume back on prazosin, continue to hold ramipril .  COPD -No active wheezing, continue with as needed neb treatment   Chronic diastolic heart failure ([place]) -compensated and stable at this time, I will DC his IV fluids  Bipolar I disorder (HCC) -stable  PAD (peripheral artery disease) ([place]) -status post recent common femoral endarterectomy        Code Status : Full  Family Communication  : wife at bedside  Disposition Plan  : pending further work up.  Consults  : Vascular surgery  Procedures  : 1. Left groin incision and drainage 2. Placement of negative pressure dressing     DVT Prophylaxis  : Subcutaneous heparin  Lab Results  Component Value Date   PLT 185 11/30/2017    Antibiotics  :   Anti-infectives (From admission, onward)   Start     Dose/Rate Route Frequency Ordered Stop   11/29/17 2200  vancomycin (VANCOCIN) 1,250 mg in sodium chloride 0.9 % 250 mL IVPB     1,250 mg 166.7 mL/hr over 90 Minutes Intravenous Every 36 hours 11/28/17 1422     11/28/17 2000  piperacillin-tazobactam (ZOSYN) IVPB 3.375 g     3.375 g 12.5 mL/hr over 240 Minutes Intravenous Every 8 hours 11/28/17 1422     11/28/17 1415  vancomycin (VANCOCIN) IVPB 1000 mg/200 mL premix     1,000 mg 200 mL/hr over 60 Minutes Intravenous  Once 11/28/17 1408 11/28/17 1529   11/28/17 1115  piperacillin-tazobactam (ZOSYN) IVPB 3.375 g     3.375 g 100 mL/hr over 30 Minutes Intravenous  Once 11/28/17 1106 11/28/17 1204   11/28/17 1115  vancomycin  (VANCOCIN) IVPB 1000 mg/200 mL premix     1,000 mg 200 mL/hr over 60 Minutes Intravenous  Once 11/28/17 1106 11/28/17 1309        Objective:   Vitals:   11/29/17 2105 11/30/17 0349 11/30/17 0748 11/30/17 0951  BP:  127/75 133/71   Pulse:  77  74  Resp:  20  18  Temp:  97.9 F (36.6 C) 97.6 F (36.4 C)   TempSrc:  Oral Oral   SpO2: 100% 99% 99% 98%  Weight:      Height:        Wt Readings from Last 3 Encounters:  11/28/17 93.9 kg (207 lb)  11/27/17 93.9 kg (207 lb)  11/17/17 94.5 kg (208 lb 5.4 oz)     Intake/Output Summary (Last 24 hours) at 11/30/2017 1417 Last data filed at 11/30/2017 0300 Gross per 24 hour  Intake 2720 ml  Output 825 ml  Net 1895 ml     Physical Exam  Awake Alert, Oriented X 3, No new F.N deficits, Normal affect  Symmetrical Chest wall movement, Good air movement bilaterally, CTAB RRR,No Gallops,Rubs or new Murmurs, No Parasternal Heave +ve B.Sounds, Abd Soft, No tenderness,  No rebound - guarding or rigidity. No Cyanosis, Clubbing or edema, No new Rash or bruise  , left pedal pulses could be felt with Doppler,left groin wound with wound VAC    Data Review:    CBC Recent Labs  Lab 11/28/17 1116 11/30/17 0902  WBC 26.2* 10.5  HGB 11.8* 10.7*  HCT 36.2* 34.2*  PLT 185 185  MCV 104.0* 106.2*  MCH 33.9 33.2  MCHC 32.6 31.3  RDW 14.2 14.1  LYMPHSABS 1.0 1.7  MONOABS 0.5 0.3  EOSABS 0.0 0.1  BASOSABS 0.0 0.0    Chemistries  Recent Labs  Lab  11/28/17 1116 11/29/17 1342 11/30/17 0250  NA 137 139 136  K 4.2 4.3 4.3  CL 100* 105 106  CO2 '22 24 25  ' GLUCOSE 182* 167* 110*  BUN 35* 27* 26*  CREATININE 2.15* 1.23 1.00  CALCIUM 8.8* 8.3* 8.2*  AST 39  --   --   ALT 20  --   --   ALKPHOS 71  --   --   BILITOT 0.7  --   --    ------------------------------------------------------------------------------------------------------------------ No results for input(s): CHOL, HDL, LDLCALC, TRIG, CHOLHDL, LDLDIRECT in the last 72  hours.  Lab Results  Component Value Date   HGBA1C 5.8 (H) 09/25/2017   ------------------------------------------------------------------------------------------------------------------ No results for input(s): TSH, T4TOTAL, T3FREE, THYROIDAB in the last 72 hours.  Invalid input(s): FREET3 ------------------------------------------------------------------------------------------------------------------ No results for input(s): VITAMINB12, FOLATE, FERRITIN, TIBC, IRON, RETICCTPCT in the last 72 hours.  Coagulation profile No results for input(s): INR, PROTIME in the last 168 hours.  No results for input(s): DDIMER in the last 72 hours.  Cardiac Enzymes No results for input(s): CKMB, TROPONINI, MYOGLOBIN in the last 168 hours.  Invalid input(s): CK ------------------------------------------------------------------------------------------------------------------    Component Value Date/Time   BNP 73.2 09/12/2017 1500    Inpatient Medications  Scheduled Meds: . clonazePAM  1 mg Oral BID  . DULoxetine  30 mg Oral Daily  . DULoxetine  60 mg Oral Daily  . gabapentin  100 mg Oral Q8H  . heparin injection (subcutaneous)  5,000 Units Subcutaneous Q8H  . mometasone-formoterol  2 puff Inhalation BID  . nicotine  21 mg Transdermal Daily  . QUEtiapine  300 mg Oral QHS   Continuous Infusions: . sodium chloride 75 mL/hr at 11/29/17 2148  . sodium chloride Stopped (11/28/17 2130)  . piperacillin-tazobactam (ZOSYN)  IV 3.375 g (11/30/17 1258)  . vancomycin Stopped (11/29/17 2344)   PRN Meds:.albuterol, oxyCODONE, traZODone, zolpidem  Micro Results Recent Results (from the past 240 hour(s))  Blood Culture (routine x 2)     Status: None (Preliminary result)   Collection Time: 11/28/17 11:11 AM  Result Value Ref Range Status   Specimen Description   Final    BLOOD RIGHT ANTECUBITAL Performed at [facility] [address], [place], [place] [address]    Special  Requests   Final    BOTTLES DRAWN AEROBIC AND ANAEROBIC Blood Culture results may not be optimal due to an excessive volume of blood received in culture bottles Performed at [facility] [address] [place], [place] [address]    Culture   Final    NO GROWTH 2 DAYS Performed at [facility] [address], [place], [place] [address]    Report Status PENDING  Incomplete  Blood Culture (routine x 2)     Status: None (Preliminary result)   Collection Time: 11/28/17 11:16 AM  Result Value Ref Range Status   Specimen Description   Final    BLOOD LEFT ANTECUBITAL Performed at [facility] [address] [place], [place] [address]    Special Requests   Final    BOTTLES DRAWN AEROBIC AND ANAEROBIC Blood Culture adequate volume Performed at [facility] [address], [place], [place] [address]    Culture   Final    NO GROWTH 2 DAYS Performed at [facility] [address] [place], [place] [address]    Report Status PENDING  Incomplete  Aerobic/Anaerobic Culture (surgical/deep wound)     Status: None (  Preliminary result)   Collection Time: 11/28/17  7:40 PM  Result Value Ref Range Status   Specimen Description WOUND LEFT GROIN  Final   Special Requests PATIENT ON VANCOMYCIN  Final   Gram Stain   Final    RARE WBC PRESENT, PREDOMINANTLY PMN RARE GRAM POSITIVE COCCI IN PAIRS Performed at [facility], [place] [address] [place], [place] [address]    Culture   Final    FEW STAPHYLOCOCCUS AUREUS NO ANAEROBES ISOLATED; CULTURE IN PROGRESS FOR 5 DAYS    Report Status PENDING  Incomplete    Radiology Reports Dg Chest Port 1 View  Result Date: 11/28/2017 CLINICAL DATA:  Sepsis EXAM: PORTABLE CHEST 1 VIEW COMPARISON:  09/22/2017 FINDINGS: Cardiac shadow is within normal limits. Postsurgical changes are noted. Elevation right hemidiaphragm is again seen with blunting of the right costophrenic angle stable  from the prior exam. No focal infiltrate or sizable effusion is seen. No bony abnormality is noted. IMPRESSION: Chronic changes without acute abnormality. Electronically Signed   By: Inez Catalina M.D.   On: 11/28/2017 11:32     Phillips Climes M.D on 11/30/2017 at 2:17 PM  Between 7am to 7pm - Pager - [phone]  After 7pm go to [email or web address] - password [facility]  [facility] -  Office  [phone]

## 2017-11-30 NOTE — Progress Notes (Signed)
Pharmacy Antibiotic Note  John Ball is a 77 y.o. male admitted on 11/28/2017 with postop wound infection, s/p most recent vascular surgery on 11/17/17.  Started outpatient Keflex on 11/27/17.  Pharmacy has been consulted for Vancomycin and Zosyn dosing. AKI resolving, Vancomycin random 10.   Plan:  Zosyn 3.375g IV Q8H infused over 4hrs.  Vancomycin 1000mg  q12 hours  Check vancomycin levels if remains on vancomycin > 3-4 days.  Goal AUC 400-500.  Follow up renal fxn, culture results, and clinical course.  F/u ability to de-escalate antibiotics.  Height: 6' (182.9 cm) Weight: 207 lb (93.9 kg) IBW/kg (Calculated) : 77.6  Temp (24hrs), Avg:97.8 F (36.6 C), Min:97.6 F (36.4 C), Max:97.9 F (36.6 C)  Recent Labs  Lab 11/28/17 1116 11/28/17 1122 11/28/17 1302 11/29/17 1342 11/30/17 0250 11/30/17 0902 11/30/17 1325 11/30/17 1456  WBC 26.2*  --   --   --   --  10.5 10.2  --   CREATININE 2.15*  --   --  1.23 1.00  --   --   --   LATICACIDVEN  --  2.80* 1.98*  --   --   --   --   --   VANCORANDOM  --   --   --   --   --   --   --  10    Estimated Creatinine Clearance: 77.1 mL/min (by C-G formula based on SCr of 1 mg/dL).    Allergies  Allergen Reactions  . Aripiprazole Other (See Comments)    Altered mental status  . Aspirin Other (See Comments)    Avoids due to severe bruises  . Crestor [Rosuvastatin Calcium] Other (See Comments)    myalgia  . Effexor [Venlafaxine Hydrochloride] Itching  . Lipitor [Atorvastatin Calcium] Other (See Comments)    myalgia  . Morphine And Related Itching  . Zetia [Ezetimibe] Other (See Comments)    myalgia  . Zocor [Simvastatin - High Dose] Other (See Comments)    myalgia    Antimicrobials this admission: 5/24 Vancomycin >> 5/24 Zosyn >>   Dose adjustments this admission:   Microbiology results: 5/24 BCx:   Thank you for allowing pharmacy to be a part of this patient's care.  Georga Bora, PharmD Clinical  Pharmacist 11/30/2017 4:27 PM

## 2017-11-30 NOTE — Progress Notes (Signed)
   Daily Progress Note   Assessment/Planning:   POD #2 s/p I&D L groin, VAC placement   VAC change tomorrow  If wound clean, continue VAC dressings: M-W-F  If wound not clean, will replace VAC and have Dr. Donnetta Hutching take patient back to OR on Wed for further debridement  Abx pending wound culture C&S   Subjective  - 2 Days Post-Op   Left groin feeling better   Objective   Vitals:   11/29/17 2105 11/30/17 0349 11/30/17 0748 11/30/17 0951  BP:  127/75 133/71   Pulse:  77  74  Resp:  20  18  Temp:  97.9 F (36.6 C) 97.6 F (36.4 C)   TempSrc:  Oral Oral   SpO2: 100% 99% 99% 98%  Weight:      Height:         Intake/Output Summary (Last 24 hours) at 11/30/2017 1129 Last data filed at 11/30/2017 0300 Gross per 24 hour  Intake 2960 ml  Output 1025 ml  Net 1935 ml    PULM  CTAB  CV  RRR  GI  soft, NTND  VASC L groin erythema resolved, VAC adherent    Laboratory   CBC CBC Latest Ref Rng & Units 11/30/2017 11/28/2017 11/18/2017  WBC 4.0 - 10.5 K/uL 10.5 26.2(H) 7.6  Hemoglobin 13.0 - 17.0 g/dL 10.7(L) 11.8(L) 11.5(L)  Hematocrit 39.0 - 52.0 % 34.2(L) 36.2(L) 35.9(L)  Platelets 150 - 400 K/uL 185 185 130(L)    BMET    Component Value Date/Time   NA 136 11/30/2017 0250   NA 143 09/25/2017 1420   NA 139 05/02/2014 1156   K 4.3 11/30/2017 0250   K 4.1 05/02/2014 1156   CL 106 11/30/2017 0250   CO2 25 11/30/2017 0250   CO2 24 05/02/2014 1156   GLUCOSE 110 (H) 11/30/2017 0250   GLUCOSE 101 05/02/2014 1156   BUN 26 (H) 11/30/2017 0250   BUN 18 09/25/2017 1420   BUN 15.7 05/02/2014 1156   CREATININE 1.00 11/30/2017 0250   CREATININE 1.21 07/07/2014 1105   CREATININE 1.1 05/02/2014 1156   CALCIUM 8.2 (L) 11/30/2017 0250   CALCIUM 9.5 05/02/2014 1156   GFRNONAA >60 11/30/2017 0250   GFRAA >60 11/30/2017 0250     Adele Barthel, MD, FACS [facility] Office: [phone] Pager: [phone]  11/30/2017, 11:29 AM

## 2017-12-01 DIAGNOSIS — Z888 Allergy status to other drugs, medicaments and biological substances status: Secondary | ICD-10-CM

## 2017-12-01 DIAGNOSIS — Z951 Presence of aortocoronary bypass graft: Secondary | ICD-10-CM

## 2017-12-01 DIAGNOSIS — Z886 Allergy status to analgesic agent status: Secondary | ICD-10-CM

## 2017-12-01 DIAGNOSIS — Z86718 Personal history of other venous thrombosis and embolism: Secondary | ICD-10-CM

## 2017-12-01 DIAGNOSIS — F17210 Nicotine dependence, cigarettes, uncomplicated: Secondary | ICD-10-CM

## 2017-12-01 DIAGNOSIS — Z885 Allergy status to narcotic agent status: Secondary | ICD-10-CM

## 2017-12-01 DIAGNOSIS — J449 Chronic obstructive pulmonary disease, unspecified: Secondary | ICD-10-CM

## 2017-12-01 DIAGNOSIS — Z9861 Coronary angioplasty status: Secondary | ICD-10-CM

## 2017-12-01 DIAGNOSIS — T8149XA Infection following a procedure, other surgical site, initial encounter: Secondary | ICD-10-CM

## 2017-12-01 DIAGNOSIS — I5032 Chronic diastolic (congestive) heart failure: Secondary | ICD-10-CM

## 2017-12-01 DIAGNOSIS — R062 Wheezing: Secondary | ICD-10-CM

## 2017-12-01 DIAGNOSIS — Z800 Family history of malignant neoplasm of digestive organs: Secondary | ICD-10-CM

## 2017-12-01 DIAGNOSIS — Z978 Presence of other specified devices: Secondary | ICD-10-CM

## 2017-12-01 DIAGNOSIS — I25799 Atherosclerosis of other coronary artery bypass graft(s) with unspecified angina pectoris: Secondary | ICD-10-CM

## 2017-12-01 DIAGNOSIS — L089 Local infection of the skin and subcutaneous tissue, unspecified: Secondary | ICD-10-CM

## 2017-12-01 DIAGNOSIS — B9562 Methicillin resistant Staphylococcus aureus infection as the cause of diseases classified elsewhere: Secondary | ICD-10-CM

## 2017-12-01 DIAGNOSIS — H9190 Unspecified hearing loss, unspecified ear: Secondary | ICD-10-CM

## 2017-12-01 LAB — BASIC METABOLIC PANEL
Anion gap: 6 (ref 5–15)
BUN: 14 mg/dL (ref 6–20)
CHLORIDE: 105 mmol/L (ref 101–111)
CO2: 31 mmol/L (ref 22–32)
CREATININE: 0.99 mg/dL (ref 0.61–1.24)
Calcium: 8.7 mg/dL — ABNORMAL LOW (ref 8.9–10.3)
GFR calc Af Amer: >60 mL/min (ref >60)
GFR calc non Af Amer: >60 mL/min (ref >60)
Glucose, Bld: 99 mg/dL (ref 65–99)
POTASSIUM: 4.9 mmol/L (ref 3.5–5.1)
Sodium: 142 mmol/L (ref 135–145)

## 2017-12-01 LAB — CBC
HEMATOCRIT: 34.1 % — AB (ref 39.0–52.0)
Hemoglobin: 10.8 g/dL — ABNORMAL LOW (ref 13.0–17.0)
MCH: 32.8 pg (ref 26.0–34.0)
MCHC: 31.7 g/dL (ref 30.0–36.0)
MCV: 103.6 fL — ABNORMAL HIGH (ref 78.0–100.0)
PLATELETS: 196 10*3/uL (ref 150–400)
RBC: 3.29 MIL/uL — AB (ref 4.22–5.81)
RDW: 13.7 % (ref 11.5–15.5)
WBC: 9.2 10*3/uL (ref 4.0–10.5)

## 2017-12-01 MED ORDER — ACETAMINOPHEN 325 MG PO TABS
650.0000 mg | ORAL_TABLET | Freq: Four times a day (QID) | ORAL | Status: DC | PRN
  Administered 2017-12-01 – 2017-12-04 (×5): 650 mg via ORAL
  Filled 2017-12-01 (×6): qty 2

## 2017-12-01 MED ORDER — RIFAMPIN 300 MG PO CAPS
300.0000 mg | ORAL_CAPSULE | Freq: Two times a day (BID) | ORAL | Status: DC
  Administered 2017-12-01 – 2017-12-05 (×9): 300 mg via ORAL
  Filled 2017-12-01 (×9): qty 1

## 2017-12-01 MED ORDER — MUPIROCIN CALCIUM 2 % EX CREA
TOPICAL_CREAM | Freq: Two times a day (BID) | CUTANEOUS | Status: DC
  Administered 2017-12-01 – 2017-12-05 (×8): via TOPICAL
  Filled 2017-12-01: qty 15

## 2017-12-01 NOTE — Progress Notes (Signed)
[facility]    Date of Admission:  11/28/2017     Total days of antibiotics 3                Reason for Consult: Wound Infection    Referring Provider: Elgergawy Primary Care Provider: Mellody Dance, DO   Assessment/Plan:  John Ball is a 77 y/o male with previous medical history of COPD, CAD s/p CABG, and left femoral endarterectomy on 5/13 admitted on 5/24 with signs/symptoms of sepsis from his endarterectomy surgical site. I&D was completed with cultures positive for MRSA infection. Started on vancomycin and is now POD 3 and has remained fever free since admission and is tolerating the vancomycin with no evidence of nephrotoxicity. Blood cultures with no growth to date. Continues to have serosanguinous drainage in the wound vac. This appears to be primarily a soft tissue infection. Dr. Donnetta Hutching to decide about return to OR for further debride.  1. Agree with continued vancomycin while in the hospital. Will also add rifampin to prevent biofilm development.  2. Recommend change to sulfamethoxazole-trimethoprim for 3 weeks at discharge with continued rifampin for 3 weeks.  3. Continue to monitor cultures.  4. Follow up with PCP or [facility].    Principal Problem:   post op Wound infection Active Problems:   S/P CABG x 4   Essential hypertension   COPD (chronic obstructive pulmonary disease) (HCC)   Coronary artery disease involving coronary bypass graft of native heart with angina pectoris ([place])   Chronic diastolic heart failure ([place])   Bipolar I disorder ([place])   PAD (peripheral artery disease) ([place])   Sepsis ([place])   . clonazePAM  1 mg Oral BID  . DULoxetine  30 mg Oral Daily  . DULoxetine  60 mg Oral Daily  . gabapentin  100 mg Oral Q8H  . heparin injection (subcutaneous)  5,000 Units Subcutaneous Q8H  . mometasone-formoterol  2 puff Inhalation BID  . nicotine  21 mg Transdermal Daily  . prazosin  1 mg Oral QHS  . QUEtiapine  300 mg Oral QHS       HPI: John Ball is a 76 y.o. male with previous medical history of COPD, CHF, DVT and CAD s/p CABG now status post left femoral endarectomy with patch angioplasty on 11/17/17. On 5/24 he experienced the associated symptoms of weakness, fatigue, and trouble walking. Noted to have fever, chills and hypotension with concern for sepsis. Noted to have an area of erythematous area with induration and clear, yellowish drainage. Taken to OR on 5/24 for I&D and placement of a wound VAC. Pus was noted in the inferior portion of the incision with greater amount in the superficial subcutaneous tissue. Necrotic fat also noted. Cultures sent positive for MRSA. He was started on vancomycin. Initial leukocytosis now resolved. He is now POD 3 and also antimicrobial day 3. Blood cultures from 5/24 with no growth to date.   Review of Systems: Review of Systems  Constitutional: Negative for chills, diaphoresis and fever.  Respiratory: Negative for cough, shortness of breath and wheezing.   Cardiovascular: Negative for chest pain and leg swelling.  Gastrointestinal: Negative for abdominal pain, constipation, diarrhea, nausea and vomiting.  Skin: Negative for rash.  Neurological: Negative for dizziness and weakness.     Past Medical History:  Diagnosis Date  . Anxiety   . Arthritis   . Complication of anesthesia    hx prolonged post op oxygen dependent/  hx hallucinations for 2  day post op lobectomy 2012  . COPD, severe ([place])   . Depression   . Essential hypertension   . Hearing loss of both ears    does wear his hearing aids  . History of arterial bypass of lower extremity    RIGHT FEM-POP  . History of CHF (congestive heart failure)   . History of DVT of lower extremity   . History of panic attacks   . Hyperlipidemia with target LDL less than 70   . Left main coronary artery disease 2004   a) Severe LM CAD 2004 -->s/p  cabg x4 (LIMA-LAD, free RIMA-RI, SVG-OM 2, SVG-RPDA); b) 06/2014: Abnormal  Myoview --> c)cardiac Cath Jan 2016: pLM 70%, pLAD 40-50%- patent LIMA-LAD.  RI -competitive flow noted from RIMA-RI graft, circumflex, normal caliber with moderate OM1.  OM 2 occluded.  SVG-OM 2 patent. RCA CTO after large RV M, faint R-R and LAD septal-PDA collaterals =--> new  . Neuropathy, peripheral   . 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 Nocturia   . OSA (obstructive sleep apnea)    uses O2  via [place]  --  intolerate to CPAP  . Peripheral arterial occlusive disease ([place])    2011   a) Gore-Tex graft right AK popA-BK popA --> b) 2012: thrombosed graft --> thrombectomy with dacron patch = 1 V runnof via Peroneal; c) 01/2014: R femoropopliteal bypass with left femoral vein;; followed by dr Early--  per last dopplers 06-28-2014 no change right graft,  50-74% stenosis common and left mid superficial femoral artery  . PTSD (post-traumatic stress disorder)    anxiety attack's---  [place] V[facility]  . Recurrent productive cough    SMOKER'S COUGH  . 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  Medical management  . Smoker unmotivated to quit    Reportedly he came close to having a nervous breakdown when he last tried to quit  . 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 Wears glasses     Social History   Tobacco Use  . Smoking status: Current Every Day Smoker    Packs/day: 0.50    Years: 55.00    Pack years: 27.50    Types: Cigarettes  .  Smokeless tobacco: Never Used  Substance Use Topics  . Alcohol use: No  . Drug use: No    Family History  Problem Relation Age of Onset  . Other Mother 13       Abdominal Aortic Aneurysm   . Stroke Mother   . Alzheimer's disease Father 84  . Stroke Father     Allergies  Allergen Reactions  . Aripiprazole Other (See Comments)    Altered mental status  . Aspirin Other (See Comments)    Avoids due to severe bruises  . Crestor [Rosuvastatin Calcium] Other (See Comments)    myalgia  .  Effexor [Venlafaxine Hydrochloride] Itching  . Lipitor [Atorvastatin Calcium] Other (See Comments)    myalgia  . Morphine And Related Itching  . Zetia [Ezetimibe] Other (See Comments)    myalgia  . Zocor [Simvastatin - High Dose] Other (See Comments)    myalgia    OBJECTIVE: Blood pressure 121/80, pulse 73, temperature 97.8 F (36.6 C), temperature source Oral, resp. rate 14, height 6' (1.829 m), weight 207 lb (93.9 kg), SpO2 95 %.  Physical Exam  Constitutional: He is oriented to person, place, and time. He appears well-developed and well-nourished. No distress.  Seated in the chair; Hard of hearing.   Cardiovascular: Normal rate, regular rhythm, normal heart sounds and intact distal pulses. Exam reveals no gallop and no friction rub.  No murmur heard. Surgical site covered with wound VAC is clean, dry and intact. There is serosanguinous drainage in the VAC canister. No tenderness or induration present.   Pulmonary/Chest: Effort normal. No stridor. No respiratory distress. He has wheezes. He has no rales. He exhibits no tenderness.  Abdominal: Soft. Bowel sounds are normal. He exhibits no distension. There is no tenderness.  Neurological: He is alert and oriented to person, place, and time.  Skin: Skin is warm and dry.  Psychiatric:  Flat affect    Lab Results Lab Results  Component Value Date   WBC 9.2 12/01/2017   HGB 10.8 (L) 12/01/2017   HCT 34.1 (L) 12/01/2017   MCV 103.6 (H)  12/01/2017   PLT 196 12/01/2017    Lab Results  Component Value Date   CREATININE 0.99 12/01/2017   BUN 14 12/01/2017   NA 142 12/01/2017   K 4.9 12/01/2017   CL 105 12/01/2017   CO2 31 12/01/2017    Lab Results  Component Value Date   ALT 20 11/28/2017   AST 39 11/28/2017   ALKPHOS 71 11/28/2017   BILITOT 0.7 11/28/2017     Microbiology: Recent Results (from the past 240 hour(s))  Blood Culture (routine x 2)     Status: None (Preliminary result)   Collection Time: 11/28/17 11:11 AM  Result Value Ref Range Status   Specimen Description   Final    BLOOD RIGHT ANTECUBITAL Performed at [facility], [place] [address], [place], [place] [address]    Special Requests   Final    BOTTLES DRAWN AEROBIC AND ANAEROBIC Blood Culture results may not be optimal due to an excessive volume of blood received in culture bottles Performed at [facility] [address], [place], [place] [address]    Culture   Final    NO GROWTH 3 DAYS Performed at [facility], [place] [address] [place], [place] [address]    Report Status PENDING  Incomplete  Blood Culture (routine x 2)     Status: None (Preliminary result)   Collection Time: 11/28/17 11:16 AM  Result Value Ref Range Status   Specimen Description   Final    BLOOD LEFT ANTECUBITAL Performed at [facility] [address] [place], [place] [address]    Special Requests   Final    BOTTLES DRAWN AEROBIC AND ANAEROBIC Blood Culture adequate volume Performed at [facility] [address] [place], [place] [address]    Culture   Final    NO GROWTH 3 DAYS Performed at [facility], [place] [address], [place], [place] [address]    Report Status PENDING  Incomplete  Aerobic/Anaerobic Culture (surgical/deep wound)  Status: None (Preliminary result)   Collection Time: 11/28/17  7:40 PM  Result Value Ref Range Status   Specimen Description WOUND LEFT GROIN   Final   Special Requests PATIENT ON VANCOMYCIN  Final   Gram Stain   Final    RARE WBC PRESENT, PREDOMINANTLY PMN RARE GRAM POSITIVE COCCI IN PAIRS Performed at [facility], [place] [address] [place], [place] [address]    Culture   Final    FEW METHICILLIN RESISTANT STAPHYLOCOCCUS AUREUS NO ANAEROBES ISOLATED; CULTURE IN PROGRESS FOR 5 DAYS    Report Status PENDING  Incomplete   Organism ID, Bacteria METHICILLIN RESISTANT STAPHYLOCOCCUS AUREUS  Final      Susceptibility   Methicillin resistant staphylococcus aureus - MIC*    CIPROFLOXACIN <=0.5 SENSITIVE Sensitive     ERYTHROMYCIN >=8 RESISTANT Resistant     GENTAMICIN <=0.5 SENSITIVE Sensitive     OXACILLIN >=4 RESISTANT Resistant     TETRACYCLINE <=1 SENSITIVE Sensitive     VANCOMYCIN <=0.5 SENSITIVE Sensitive     TRIMETH/SULFA <=10 SENSITIVE Sensitive     CLINDAMYCIN <=0.25 SENSITIVE Sensitive     RIFAMPIN <=0.5 SENSITIVE Sensitive     Inducible Clindamycin NEGATIVE Sensitive     * FEW METHICILLIN RESISTANT STAPHYLOCOCCUS AUREUS     Terri Piedra, NP [facility] [phone] Pager  12/01/2017  3:02 PM

## 2017-12-01 NOTE — Progress Notes (Signed)
PROGRESS NOTE                                                                                                                                                                                                             Patient Demographics:    John Ball, is a 77 y.o. male, DOB - 30-Dec-1940, QIW:[id number]  Admit date - 11/28/2017   Admitting Physician Phillips Grout, MD  Outpatient Primary MD for the patient is Mellody Dance, DO  LOS - 3   Chief Complaint  Patient presents with  . Gait Problem       Brief Narrative  77 y.o. male with medical history significant of COPD, CHF, DVT, coronary artery disease status post CABG, with recent ischemic left lower extremity status post common femoral profundus endarterectomy with Dacron patch angioplasty reimplantation of native superficial femoral artery into [place] patch by Dr. early on Nov 17, 2017 comes in with over 2 days of worsening swelling to the left groin area , found to have sepsis, status post I&D of left groin area by Dr. Bridgett Larsson.     Subjective:    John Ball today has, No headache, No chest pain, No abdominal pain   Assessment  & Plan :    Principal Problem:   post op Wound infection Active Problems:   S/P CABG x 4   Essential hypertension   COPD (chronic obstructive pulmonary disease) (HCC)   Coronary artery disease involving coronary bypass graft of native heart with angina pectoris (HCC)   Chronic diastolic heart failure (HCC)   Bipolar I disorder (HCC)   PAD (peripheral artery disease) ([place])   Sepsis ([place])   Sepsis secondary to post op Wound infection -Sepsis criteria met on admission, elevated lactic acid, hypotensive, leukocytosis - s/p external iliac, common femoral and profundus femoral artery endarterectomy with Dacron patch angioplasty and reimplantation of native superficial femoral artery into Dacron patch on 11/17/17. -Presents with wound infection, significant  leukocytosis, started on empiric IV antibiotic coverage vancomycin and Zosyn, blood culture so far with no growth to date, culture showing MRSA and he should be covered, I have discussed with ID, they will consult officially regarding antibiotics recommendation. -VAC change today for further management per vascular surgery  CAD status post CABG -He denies any chest pain or shortness of breath  Hypertension - Blood pressure  soft initially, medication has been held, now started to increase, back on prazosin, continue to monitor and if needed resume back on home dose of ramipril.  COPD -No active wheezing, continue with as needed neb treatment   Chronic diastolic heart failure ([place]) -compensated and stable at this time, I will DC his IV fluids  Bipolar I disorder (HCC) -stable  PAD (peripheral artery disease) ([place]) -status post recent common femoral endarterectomy        Code Status : Full  Family Communication  : none at bedside  Disposition Plan  : pending further work up.  Consults  : Vascular surgery  Procedures  : 1. Left groin incision and drainage 2. Placement of negative pressure dressing     DVT Prophylaxis  : Subcutaneous heparin  Lab Results  Component Value Date   PLT 196 12/01/2017    Antibiotics  :   Anti-infectives (From admission, onward)   Start     Dose/Rate Route Frequency Ordered Stop   11/30/17 1800  vancomycin (VANCOCIN) IVPB 1000 mg/200 mL premix     1,000 mg 200 mL/hr over 60 Minutes Intravenous Every 12 hours 11/30/17 1627     11/29/17 2200  vancomycin (VANCOCIN) 1,250 mg in sodium chloride 0.9 % 250 mL IVPB  Status:  Discontinued     1,250 mg 166.7 mL/hr over 90 Minutes Intravenous Every 36 hours 11/28/17 1422 11/30/17 1627   11/28/17 2000  piperacillin-tazobactam (ZOSYN) IVPB 3.375 g  Status:  Discontinued     3.375 g 12.5 mL/hr over 240 Minutes Intravenous Every 8 hours 11/28/17 1422 12/01/17 1427   11/28/17 1415  vancomycin  (VANCOCIN) IVPB 1000 mg/200 mL premix     1,000 mg 200 mL/hr over 60 Minutes Intravenous  Once 11/28/17 1408 11/28/17 1529   11/28/17 1115  piperacillin-tazobactam (ZOSYN) IVPB 3.375 g     3.375 g 100 mL/hr over 30 Minutes Intravenous  Once 11/28/17 1106 11/28/17 1204   11/28/17 1115  vancomycin (VANCOCIN) IVPB 1000 mg/200 mL premix     1,000 mg 200 mL/hr over 60 Minutes Intravenous  Once 11/28/17 1106 11/28/17 1309        Objective:   Vitals:   12/01/17 0403 12/01/17 0810 12/01/17 0828 12/01/17 1216  BP: (!) 146/82 120/83  121/80  Pulse: 87 82 75 73  Resp: _0 Temp: 97.8 F (36.6 C) 97.9 F (36.6 C)  97.8 F (36.6 C)  TempSrc: Oral Oral  Oral  SpO2: 95% 95% 98% 95%  Weight:      Height:        Wt Readings from Last 3 Encounters:  11/28/17 93.9 kg (207 lb)  11/27/17 93.9 kg (207 lb)  11/17/17 94.5 kg (208 lb 5.4 oz)     Intake/Output Summary (Last 24 hours) at 12/01/2017 1455 Last data filed at 12/01/2017 0800 Gross per 24 hour  Intake 1710 ml  Output 1950 ml  Net -240 ml     Physical Exam  Awake, Alert oriented x3,  Good air entry bilaterally, no wheezing rales rhonchi  Regular rate and rhythm, no rubs murmurs gallops  Abdomen soft, nontender, nondistended, bowel sounds present  Right groin with wound VAC, no cyanosis in lower extremities.    Data Review:    CBC Recent Labs  Lab 11/28/17 1116 11/30/17 0902 11/30/17 1325 12/01/17 0252  WBC 26.2* 10.5 10.2 9.2  HGB 11.8* 10.7* 10.4* 10.8*  HCT 36.2* 34.2* 33.4* 34.1*  PLT 185 185 192 196  MCV  104.0* 106.2* 105.4* 103.6*  MCH 33.9 33.2 32.8 32.8  MCHC 32.6 31.3 31.1 31.7  RDW 14.2 14.1 14.1 13.7  LYMPHSABS 1.0 1.7  --   --   MONOABS 0.5 0.3  --   --   EOSABS 0.0 0.1  --   --   BASOSABS 0.0 0.0  --   --     Chemistries  Recent Labs  Lab 11/28/17 1116 11/29/17 1342 11/30/17 0250 12/01/17 0252  NA 137 139 136 142  K 4.2 4.3 4.3 4.9  CL 100* 105 106 105  CO2 _0 GLUCOSE 182* 167* 110* 99  BUN 35* 27* 26* 14  CREATININE 2.15* 1.23 1.00 0.99  CALCIUM 8.8* 8.3* 8.2* 8.7*  AST 39  --   --   --   ALT 20  --   --   --   ALKPHOS 71  --   --   --   BILITOT 0.7  --   --   --    ------------------------------------------------------------------------------------------------------------------ No results for input(s): CHOL, HDL, LDLCALC, TRIG, CHOLHDL, LDLDIRECT in the last 72 hours.  Lab Results  Component Value Date   HGBA1C 5.8 (H) 09/25/2017   ------------------------------------------------------------------------------------------------------------------ No results for input(s): TSH, T4TOTAL, T3FREE, THYROIDAB in the last 72 hours.  Invalid input(s): FREET3 ------------------------------------------------------------------------------------------------------------------ No results for input(s): VITAMINB12, FOLATE, FERRITIN, TIBC, IRON, RETICCTPCT in the last 72 hours.  Coagulation profile No results for input(s): INR, PROTIME in the last 168 hours.  No results for input(s): DDIMER in the last 72 hours.  Cardiac Enzymes No results for input(s): CKMB, TROPONINI, MYOGLOBIN in the last 168 hours.  Invalid input(s): CK ------------------------------------------------------------------------------------------------------------------    Component Value Date/Time   BNP 73.2 09/12/2017 1500    Inpatient Medications  Scheduled Meds: . clonazePAM  1 mg Oral BID  . DULoxetine  30 mg Oral Daily  . DULoxetine  60 mg Oral Daily  . gabapentin  100 mg Oral Q8H  . heparin injection (subcutaneous)  5,000 Units Subcutaneous Q8H  . mometasone-formoterol  2 puff Inhalation BID  . nicotine  21 mg Transdermal Daily  . prazosin  1 mg Oral QHS  . QUEtiapine  300 mg Oral QHS   Continuous Infusions: . sodium chloride Stopped (11/28/17 2130)  . vancomycin 1,000 mg (12/01/17 0500)   PRN Meds:.albuterol, oxyCODONE, traZODone, zolpidem  Micro  Results Recent Results (from the past 240 hour(s))  Blood Culture (routine x 2)     Status: None (Preliminary result)   Collection Time: 11/28/17 11:11 AM  Result Value Ref Range Status   Specimen Description   Final    BLOOD RIGHT ANTECUBITAL Performed at [facility] [address], [place], [place] [address]    Special Requests   Final    BOTTLES DRAWN AEROBIC AND ANAEROBIC Blood Culture results may not be optimal due to an excessive volume of blood received in culture bottles Performed at [facility] [address], [place], [place] [address]    Culture   Final    NO GROWTH 3 DAYS Performed at [facility], [place] [address] [place], [place] [address]    Report Status PENDING  Incomplete  Blood Culture (routine x 2)     Status: None (Preliminary result)   Collection Time: 11/28/17 11:16 AM  Result Value Ref Range Status   Specimen Description   Final    BLOOD LEFT ANTECUBITAL Performed at [facility]  [address], [place], [place] [address]    Special Requests   Final    BOTTLES DRAWN AEROBIC AND ANAEROBIC Blood Culture adequate volume Performed at [facility] [address] [place], [place] [address]    Culture   Final    NO GROWTH 3 DAYS Performed at [facility], [place] [address], [place], [place] [address]    Report Status PENDING  Incomplete  Aerobic/Anaerobic Culture (surgical/deep wound)     Status: None (Preliminary result)   Collection Time: 11/28/17  7:40 PM  Result Value Ref Range Status   Specimen Description WOUND LEFT GROIN  Final   Special Requests PATIENT ON VANCOMYCIN  Final   Gram Stain   Final    RARE WBC PRESENT, PREDOMINANTLY PMN RARE GRAM POSITIVE COCCI IN PAIRS Performed at [facility], [place] [address] [place], [place] [address]    Culture   Final    FEW METHICILLIN RESISTANT STAPHYLOCOCCUS AUREUS NO ANAEROBES ISOLATED; CULTURE IN PROGRESS FOR 5  DAYS    Report Status PENDING  Incomplete   Organism ID, Bacteria METHICILLIN RESISTANT STAPHYLOCOCCUS AUREUS  Final      Susceptibility   Methicillin resistant staphylococcus aureus - MIC*    CIPROFLOXACIN <=0.5 SENSITIVE Sensitive     ERYTHROMYCIN >=8 RESISTANT Resistant     GENTAMICIN <=0.5 SENSITIVE Sensitive     OXACILLIN >=4 RESISTANT Resistant     TETRACYCLINE <=1 SENSITIVE Sensitive     VANCOMYCIN <=0.5 SENSITIVE Sensitive     TRIMETH/SULFA <=10 SENSITIVE Sensitive     CLINDAMYCIN <=0.25 SENSITIVE Sensitive     RIFAMPIN <=0.5 SENSITIVE Sensitive     Inducible Clindamycin NEGATIVE Sensitive     * FEW METHICILLIN RESISTANT STAPHYLOCOCCUS AUREUS    Radiology Reports Dg Chest Port 1 View  Result Date: 11/28/2017 CLINICAL DATA:  Sepsis EXAM: PORTABLE CHEST 1 VIEW COMPARISON:  09/22/2017 FINDINGS: Cardiac shadow is within normal limits. Postsurgical changes are noted. Elevation right hemidiaphragm is again seen with blunting of the right costophrenic angle stable from the prior exam. No focal infiltrate or sizable effusion is seen. No bony abnormality is noted. IMPRESSION: Chronic changes without acute abnormality. Electronically Signed   By: Inez Catalina M.D.   On: 11/28/2017 11:32     Phillips Climes M.D on 12/01/2017 at 2:55 PM  Between 7am to 7pm - Pager - [phone]  After 7pm go to [email or web address] - password [email or web address]  [facility] -  Office  [phone]

## 2017-12-01 NOTE — Consult Note (Signed)
[facility] Nurse wound consult note PA at the bedside to assess wound during the first post-op dressing Reason for Consult: Full thickness wound to left groin Measurement: 10X.5X1cm Wound bed: red and moist Drainage (amount, consistency, odor) mod amt yellow drainage in the cannister, no odor Periwound: intact skin surrounding Dressing procedure/placement/frequency: Applied barrier ring to assist with maintaining a seal, and Mepitel contact layer to decrease discomfort with next dressing change, and one piece black foam to 185mm cont suction.  Pt tolerated with mod amt discomfort and pain meds given.  Plan to change again on Wed if still in the hospital at that time. Julien Girt MSN, RN, [place], [place], [place]

## 2017-12-01 NOTE — Progress Notes (Addendum)
Progress Note    12/01/2017 1:42 PM 3 Days Post-Op  Subjective:  Incision soreness L groin   Vitals:   12/01/17 0828 12/01/17 1216  BP:  121/80  Pulse: 75 73  Resp: 16 14  Temp:  97.8 F (36.6 C)  SpO2: 98% 95%   Physical Exam: Lungs:  Non labored on [place] Incisions:  L groin wound vac removed; serous collection in cannister; healthy appearing wound bed without necrotic tissue; no purulence noted; tissue covering CFA without exposure of dacron patch; L DP and PT by doppler Abdomen:  Soft Neurologic: A&O  CBC    Component Value Date/Time   WBC 9.2 12/01/2017 0252   RBC 3.29 (L) 12/01/2017 0252   HGB 10.8 (L) 12/01/2017 0252   HGB 14.6 09/25/2017 1420   HGB 14.4 05/02/2014 1156   HCT 34.1 (L) 12/01/2017 0252   HCT 43.0 09/25/2017 1420   HCT 43.9 05/02/2014 1156   PLT 196 12/01/2017 0252   PLT 228 09/25/2017 1420   MCV 103.6 (H) 12/01/2017 0252   MCV 98 (H) 09/25/2017 1420   MCV 96.1 05/02/2014 1156   MCH 32.8 12/01/2017 0252   MCHC 31.7 12/01/2017 0252   RDW 13.7 12/01/2017 0252   RDW 14.6 09/25/2017 1420   RDW 14.1 05/02/2014 1156   LYMPHSABS 1.7 11/30/2017 0902   LYMPHSABS 1.6 05/02/2014 1156   MONOABS 0.3 11/30/2017 0902   MONOABS 0.4 05/02/2014 1156   EOSABS 0.1 11/30/2017 0902   EOSABS 0.2 05/02/2014 1156   BASOSABS 0.0 11/30/2017 0902   BASOSABS 0.0 05/02/2014 1156    BMET    Component Value Date/Time   NA 142 12/01/2017 0252   NA 143 09/25/2017 1420   NA 139 05/02/2014 1156   K 4.9 12/01/2017 0252   K 4.1 05/02/2014 1156   CL 105 12/01/2017 0252   CO2 31 12/01/2017 0252   CO2 24 05/02/2014 1156   GLUCOSE 99 12/01/2017 0252   GLUCOSE 101 05/02/2014 1156   BUN 14 12/01/2017 0252   BUN 18 09/25/2017 1420   BUN 15.7 05/02/2014 1156   CREATININE 0.99 12/01/2017 0252   CREATININE 1.21 07/07/2014 1105   CREATININE 1.1 05/02/2014 1156   CALCIUM 8.7 (L) 12/01/2017 0252   CALCIUM 9.5 05/02/2014 1156   GFRNONAA >60 12/01/2017 0252   GFRAA >60  12/01/2017 0252    INR    Component Value Date/Time   INR 1.05 11/12/2017 1344     Intake/Output Summary (Last 24 hours) at 12/01/2017 1342 Last data filed at 12/01/2017 0800 Gross per 24 hour  Intake 1950 ml  Output 2650 ml  Net -700 ml     Assessment/Plan:  77 y.o. male is s/p I&D L groin 3 Days Post-Op   Wound vac dressing change by WOC Nurse No exposure of dacron patch Continue empiric abx until wound cultures resulted Encouraged ambulation  DVT prophylaxis:  subq heparin   Dagoberto Ligas, PA-C [facility] [phone] 12/01/2017 1:42 PM  Addendum  I have independently interviewed and examined the patient, and I agree with the physician assistant's findings.  Per my PA picture of the wound, no frank necrotic tissue residual with some early granulation.  Continue wound VAC.  Wound growing MRSA:    Susceptibility    Methicillin resistant staphylococcus aureus    MIC    CIPROFLOXACIN <=0.5 SENSI... Sensitive    CLINDAMYCIN <=0.25 SENS... Sensitive    ERYTHROMYCIN >=8 RESISTANT  Resistant    GENTAMICIN <=0.5 SENSI... Sensitive  Inducible Clindamycin NEGATIVE  Sensitive    OXACILLIN >=4 RESISTANT  Resistant    RIFAMPIN <=0.5 SENSI... Sensitive    TETRACYCLINE <=1 SENSITIVE  Sensitive    TRIMETH/SULFA <=10 SENSIT... Sensitive    VANCOMYCIN <=0.5 SENSI... Sensitive      While admitted continue Vancomycin.    - Dr. Donnetta Hutching will be back tomorrow - D/C plan per Dr. Carron Curie, MD, [facility] [place] Office: [phone] Pager: [phone]  12/01/2017, 2:07 PM

## 2017-12-02 DIAGNOSIS — A4902 Methicillin resistant Staphylococcus aureus infection, unspecified site: Secondary | ICD-10-CM

## 2017-12-02 DIAGNOSIS — T148XXA Other injury of unspecified body region, initial encounter: Secondary | ICD-10-CM

## 2017-12-02 DIAGNOSIS — I25709 Atherosclerosis of coronary artery bypass graft(s), unspecified, with unspecified angina pectoris: Secondary | ICD-10-CM

## 2017-12-02 DIAGNOSIS — I10 Essential (primary) hypertension: Secondary | ICD-10-CM

## 2017-12-02 LAB — CBC
HCT: 35.4 % — ABNORMAL LOW (ref 39.0–52.0)
Hemoglobin: 11.4 g/dL — ABNORMAL LOW (ref 13.0–17.0)
MCH: 32.8 pg (ref 26.0–34.0)
MCHC: 32.2 g/dL (ref 30.0–36.0)
MCV: 101.7 fL — ABNORMAL HIGH (ref 78.0–100.0)
PLATELETS: 198 10*3/uL (ref 150–400)
RBC: 3.48 MIL/uL — AB (ref 4.22–5.81)
RDW: 13.1 % (ref 11.5–15.5)
WBC: 6.3 10*3/uL (ref 4.0–10.5)

## 2017-12-02 NOTE — H&P (View-Only) (Signed)
[facility] [place]  Subjective  - Doing well.   Objective 110/77 76 97.8 F (36.6 C) (Oral) 16 98%  Intake/Output Summary (Last 24 hours) at 12/02/2017 0718 Last data filed at 12/02/2017 0407 Gross per 24 hour  Intake 780 ml  Output 1635 ml  Net -855 ml    left groin wound care  left DP/PT signal Small medial first MT head wound without erythema or drainage. Heart SR with few PVC Lungs non labored breathing  Assessment/Planning: POD # 4 I and D left groin without exposed graft  Left groin cultures: FEW METHICILLIN RESISTANT STAPHYLOCOCCUS AUREUS  Currently on IV Rifampin and Vanco.  Afebrile and WBC 6.3. Wound vac changed yesterday. Will discuss discharge plan with Dr. Donnetta Hutching He will likely be discharged with wound vac and oral antibiotics.   Roxy Horseman 12/02/2017 7:18 AM --  Laboratory Lab Results: Recent Labs    12/01/17 0252 12/02/17 0539  WBC 9.2 6.3  HGB 10.8* 11.4*  HCT 34.1* 35.4*  PLT 196 198   BMET Recent Labs    11/30/17 0250 12/01/17 0252  NA 136 142  K 4.3 4.9  CL 106 105  CO2 25 31  GLUCOSE 110* 99  BUN 26* 14  CREATININE 1.00 0.99  CALCIUM 8.2* 8.7*    COAG Lab Results  Component Value Date   INR 1.05 11/12/2017   INR 0.95 07/07/2014   INR 1.01 01/07/2014   No results found for: PTT  I have examined the patient, reviewed and agree with above.  VAC dressing in place.  Appreciate Dr. Starlyn Skeans help over the weekend.  Concerned with Dacron patch in the base of his wound.  Will return to the operating room tomorrow for debridement and VAC change.  If this looks okay can be discharged on Thursday with oral anabiotic's per ID recommendation  Curt Jews, MD 12/02/2017 9:07 AM

## 2017-12-02 NOTE — Progress Notes (Signed)
Wound VAC order on front of chart for MD to sign

## 2017-12-02 NOTE — Progress Notes (Signed)
PROGRESS NOTE  John Ball WNU:[id number] DOB: 09-Aug-1940 DOA: 11/28/2017 PCP: Mellody Dance, DO   LOS: 4 days   Brief Narrative / Interim history: 77 y.o.malewith medical history significant ofCOPD, CHF, DVT, coronary artery disease status post CABG, with recent ischemic left lower extremity status post common femoral profundus endarterectomy with Dacron patch angioplasty reimplantation of native superficial femoral artery [place] patch by Dr. early on Nov 17, 2017 comes in with over 2 days of worsening swelling to the left groin area , found to have sepsis, status post I&D of left groin area by Dr. Bridgett Larsson.  Assessment & Plan: Principal Problem:   post op Wound infection Active Problems:   S/P CABG x 4   Essential hypertension   COPD (chronic obstructive pulmonary disease) (HCC)   Coronary artery disease involving coronary bypass graft of native heart with angina pectoris (HCC)   Chronic diastolic heart failure (HCC)   Bipolar I disorder (HCC)   PAD (peripheral artery disease) (HCC)   Sepsis ([place])   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)   Sepsis secondary to post op Wound infection -Sepsis criteria met on admission, elevated lactic acid, hypotensive, leukocytosis -s/p external iliac, common femoral and profundus femoral artery endarterectomy with Dacron patch angioplasty and reimplantation of native superficial femoral artery into Dacron patch on 11/17/17. -Presents with wound infection, significant leukocytosis, started on empiric IV antibiotic coverage vancomycin and Zosyn, blood culture so far with no growth to date, culture showing MRSA and he should be covered -ID consulted, appreciate input, for now continue vancomycin alone and when he is ready for discharge to start oral rifampin 300 mg twice daily for 3 additional weeks, along with Bactrim DS 1 twice daily -Vascular surgery following, he will be taken back to the operating room  for debridement and wound VAC change on 5/29  CAD status post CABG -He denies any chest pain or shortness of breath  Hypertension -Blood pressure soft initially, medication has been held, now started to increase, back on prazosin, continue to monitor and if needed resume back on home dose of ramipril. -Blood pressure stable, continue prazosin alone  COPD -No active wheezing, continue with as needed neb treatment  Chronic diastolic heart failure ([place]) -compensated and stable at this time  Bipolar I disorder (HCC) -stable  PAD (peripheral artery disease) ([place]) -status post recent common femoral endarterectomy    DVT prophylaxis: heparin Code Status: Full code Family Communication: no family at bedside Disposition Plan: home when ready   Consultants:   Vascular surgery   Procedures:   1. Left groin incision and drainage 2. Placement of negative pressure dressing  Antimicrobials:  Vancomycin   Subjective: - no chest pain, shortness of breath, no abdominal pain, nausea or vomiting.  Doing well this morning  Objective: Vitals:   12/01/17 2045 12/02/17 0407 12/02/17 0827 12/02/17 1151  BP:  110/77  137/79  Pulse:  76 78 81  Resp:  16 14   Temp:  97.8 F (36.6 C)    TempSrc:  Oral    SpO2: 96% 98% 95%   Weight:      Height:        Intake/Output Summary (Last 24 hours) at 12/02/2017 1447 Last data filed at 12/02/2017 0407 Gross per 24 hour  Intake 780 ml  Output 1235 ml  Net -455 ml   Filed Weights   11/28/17 1833  Weight: 93.9 kg (207 lb)    Examination:  Constitutional: NAD Eyes: lids and  conjunctivae normal ENMT: Mucous membranes are moist. Neck: normal, supple Respiratory: clear to auscultation bilaterally, no wheezing, no crackles.  Cardiovascular: Regular rate and rhythm, no murmurs / rubs / gallops. No LE edema. Abdomen: no tenderness. Bowel sounds positive. Skin: no rashes, left groin wound with wound VAC in place Neurologic: non  focal    Data Reviewed: I have independently reviewed following labs and imaging studies   CBC: Recent Labs  Lab 11/28/17 1116 11/30/17 0902 11/30/17 1325 12/01/17 0252 12/02/17 0539  WBC 26.2* 10.5 10.2 9.2 6.3  NEUTROABS 24.7* 8.2*  --   --   --   HGB 11.8* 10.7* 10.4* 10.8* 11.4*  HCT 36.2* 34.2* 33.4* 34.1* 35.4*  MCV 104.0* 106.2* 105.4* 103.6* 101.7*  PLT 185 185 192 196 097   Basic Metabolic Panel: Recent Labs  Lab 11/28/17 1116 11/29/17 1342 11/30/17 0250 12/01/17 0252  NA 137 139 136 142  K 4.2 4.3 4.3 4.9  CL 100* 105 106 105  CO2 _0 GLUCOSE 182* 167* 110* 99  BUN 35* 27* 26* 14  CREATININE 2.15* 1.23 1.00 0.99  CALCIUM 8.8* 8.3* 8.2* 8.7*   GFR: Estimated Creatinine Clearance: 77.9 mL/min (by C-G formula based on SCr of 0.99 mg/dL). Liver Function Tests: Recent Labs  Lab 11/28/17 1116  AST 39  ALT 20  ALKPHOS 71  BILITOT 0.7  PROT 7.5  ALBUMIN 3.7   No results for input(s): LIPASE, AMYLASE in the last 168 hours. No results for input(s): AMMONIA in the last 168 hours. Coagulation Profile: No results for input(s): INR, PROTIME in the last 168 hours. Cardiac Enzymes: No results for input(s): CKTOTAL, CKMB, CKMBINDEX, TROPONINI in the last 168 hours. BNP (last 3 results) No results for input(s): PROBNP in the last 8760 hours. HbA1C: No results for input(s): HGBA1C in the last 72 hours. CBG: Recent Labs  Lab 11/29/17 1148 11/29/17 1643  GLUCAP 146* 154*   Lipid Profile: No results for input(s): CHOL, HDL, LDLCALC, TRIG, CHOLHDL, LDLDIRECT in the last 72 hours. Thyroid Function Tests: No results for input(s): TSH, T4TOTAL, FREET4, T3FREE, THYROIDAB in the last 72 hours. Anemia Panel: No results for input(s): VITAMINB12, FOLATE, FERRITIN, TIBC, IRON, RETICCTPCT in the last 72 hours. Urine analysis:    Component Value Date/Time   COLORURINE YELLOW 11/12/2017 1344   APPEARANCEUR CLEAR 11/12/2017 1344   LABSPEC >1.046 (H)  11/12/2017 1344   PHURINE 7.0 11/12/2017 1344   GLUCOSEU NEGATIVE 11/12/2017 1344   HGBUR NEGATIVE 11/12/2017 1344   BILIRUBINUR NEGATIVE 11/12/2017 1344   KETONESUR NEGATIVE 11/12/2017 1344   PROTEINUR NEGATIVE 11/12/2017 1344   UROBILINOGEN 0.2 02/23/2010 1546   NITRITE NEGATIVE 11/12/2017 1344   LEUKOCYTESUR NEGATIVE 11/12/2017 1344   Sepsis Labs: Invalid input(s): PROCALCITONIN, LACTICIDVEN  Recent Results (from the past 240 hour(s))  Blood Culture (routine x 2)     Status: None (Preliminary result)   Collection Time: 11/28/17 11:11 AM  Result Value Ref Range Status   Specimen Description   Final    BLOOD RIGHT ANTECUBITAL Performed at [facility] [address] [place], [place] [address]    Special Requests   Final    BOTTLES DRAWN AEROBIC AND ANAEROBIC Blood Culture results may not be optimal due to an excessive volume of blood received in culture bottles Performed at [facility] [address], [place], [place] [address]    Culture   Final    NO GROWTH 4 DAYS Performed at [facility]  [facility], [place] [address], [place], [place] [address]    Report Status PENDING  Incomplete  Blood Culture (routine x 2)     Status: None (Preliminary result)   Collection Time: 11/28/17 11:16 AM  Result Value Ref Range Status   Specimen Description   Final    BLOOD LEFT ANTECUBITAL Performed at [facility] [address], [place], [place] [address]    Special Requests   Final    BOTTLES DRAWN AEROBIC AND ANAEROBIC Blood Culture adequate volume Performed at [facility] [address], [place], [place] [address]    Culture   Final    NO GROWTH 4 DAYS Performed at [facility], [place] [address] [place], [place] [address]    Report Status PENDING  Incomplete  Aerobic/Anaerobic Culture (surgical/deep wound)     Status: None (Preliminary result)   Collection Time: 11/28/17  7:40 PM  Result Value Ref  Range Status   Specimen Description WOUND LEFT GROIN  Final   Special Requests PATIENT ON VANCOMYCIN  Final   Gram Stain   Final    RARE WBC PRESENT, PREDOMINANTLY PMN RARE GRAM POSITIVE COCCI IN PAIRS Performed at [facility], [place] [address] [place], [place] [address]    Culture   Final    FEW METHICILLIN RESISTANT STAPHYLOCOCCUS AUREUS NO ANAEROBES ISOLATED; CULTURE IN PROGRESS FOR 5 DAYS    Report Status PENDING  Incomplete   Organism ID, Bacteria METHICILLIN RESISTANT STAPHYLOCOCCUS AUREUS  Final      Susceptibility   Methicillin resistant staphylococcus aureus - MIC*    CIPROFLOXACIN <=0.5 SENSITIVE Sensitive     ERYTHROMYCIN >=8 RESISTANT Resistant     GENTAMICIN <=0.5 SENSITIVE Sensitive     OXACILLIN >=4 RESISTANT Resistant     TETRACYCLINE <=1 SENSITIVE Sensitive     VANCOMYCIN <=0.5 SENSITIVE Sensitive     TRIMETH/SULFA <=10 SENSITIVE Sensitive     CLINDAMYCIN <=0.25 SENSITIVE Sensitive     RIFAMPIN <=0.5 SENSITIVE Sensitive     Inducible Clindamycin NEGATIVE Sensitive     * FEW METHICILLIN RESISTANT STAPHYLOCOCCUS AUREUS      Radiology Studies: No results found.   Scheduled Meds: . clonazePAM  1 mg Oral BID  . DULoxetine  30 mg Oral Daily  . DULoxetine  60 mg Oral Daily  . gabapentin  100 mg Oral Q8H  . heparin injection (subcutaneous)  5,000 Units Subcutaneous Q8H  . mometasone-formoterol  2 puff Inhalation BID  . mupirocin cream   Topical BID  . nicotine  21 mg Transdermal Daily  . prazosin  1 mg Oral QHS  . QUEtiapine  300 mg Oral QHS  . rifampin  300 mg Oral BID WC   Continuous Infusions: . sodium chloride Stopped (11/28/17 2130)  . vancomycin Stopped (12/02/17 0743)    Marzetta Board, MD, PhD [facility] Pager [phone]  If 7PM-7AM, please contact night-coverage [email or web address] Password [facility] 12/02/2017, 2:47 PM

## 2017-12-02 NOTE — Progress Notes (Addendum)
[facility] [place]  Subjective  - Doing well.   Objective 110/77 76 97.8 F (36.6 C) (Oral) 16 98%  Intake/Output Summary (Last 24 hours) at 12/02/2017 0718 Last data filed at 12/02/2017 0407 Gross per 24 hour  Intake 780 ml  Output 1635 ml  Net -855 ml    left groin wound care  left DP/PT signal Small medial first MT head wound without erythema or drainage. Heart SR with few PVC Lungs non labored breathing  Assessment/Planning: POD # 4 I and D left groin without exposed graft  Left groin cultures: FEW METHICILLIN RESISTANT STAPHYLOCOCCUS AUREUS  Currently on IV Rifampin and Vanco.  Afebrile and WBC 6.3. Wound vac changed yesterday. Will discuss discharge plan with Dr. Donnetta Hutching He will likely be discharged with wound vac and oral antibiotics.   Roxy Horseman 12/02/2017 7:18 AM --  Laboratory Lab Results: Recent Labs    12/01/17 0252 12/02/17 0539  WBC 9.2 6.3  HGB 10.8* 11.4*  HCT 34.1* 35.4*  PLT 196 198   BMET Recent Labs    11/30/17 0250 12/01/17 0252  NA 136 142  K 4.3 4.9  CL 106 105  CO2 25 31  GLUCOSE 110* 99  BUN 26* 14  CREATININE 1.00 0.99  CALCIUM 8.2* 8.7*    COAG Lab Results  Component Value Date   INR 1.05 11/12/2017   INR 0.95 07/07/2014   INR 1.01 01/07/2014   No results found for: PTT  I have examined the patient, reviewed and agree with above.  VAC dressing in place.  Appreciate Dr. Starlyn Skeans help over the weekend.  Concerned with Dacron patch in the base of his wound.  Will return to the operating room tomorrow for debridement and VAC change.  If this looks okay can be discharged on Thursday with oral anabiotic's per ID recommendation  Curt Jews, MD 12/02/2017 9:07 AM

## 2017-12-02 NOTE — Care Management Note (Signed)
Case Management Note  Patient Details  Name: John Ball MRN: [id number] Date of Birth: 02/14/1941  Subjective/Objective:                 Admitted w post op wound infection. Anticipate Sx tomorrow for debridement and VAC change.    Action/Plan:  [facility] VAC order form on front of chart. Needs completed and faxed. Active w [name], will need resumption order and faxed out.   Expected Discharge Date:  (unknown)               Expected Discharge Plan:  [place]  In-House Referral:     Discharge planning Services  CM Consult  Post Acute Care Choice:  Home Health, Resumption of Svcs/PTA Provider Choice offered to:  Patient  DME Arranged:  Vac DME Agency:  [facility]  HH Arranged:  RN [place] Agency:  [name]  Status of Service:  In process, will continue to follow  If discussed at Long Length of Stay Meetings, dates discussed:    Additional Comments:  Carles Collet, RN 12/02/2017, 3:39 PM

## 2017-12-03 ENCOUNTER — Encounter (HOSPITAL_COMMUNITY): Payer: Self-pay | Admitting: *Deleted

## 2017-12-03 ENCOUNTER — Inpatient Hospital Stay (HOSPITAL_COMMUNITY): Payer: Medicare Other | Admitting: Anesthesiology

## 2017-12-03 ENCOUNTER — Encounter (HOSPITAL_COMMUNITY)
Admission: EM | Disposition: A | Discharge status: Home Health | Payer: Self-pay | Source: Home / Self Care | Attending: Internal Medicine

## 2017-12-03 HISTORY — PX: APPLICATION OF WOUND VAC: SHX5189

## 2017-12-03 LAB — CBC
HEMATOCRIT: 37.8 % — AB (ref 39.0–52.0)
HEMOGLOBIN: 12.2 g/dL — AB (ref 13.0–17.0)
MCH: 32.8 pg (ref 26.0–34.0)
MCHC: 32.3 g/dL (ref 30.0–36.0)
MCV: 101.6 fL — ABNORMAL HIGH (ref 78.0–100.0)
Platelets: 209 10*3/uL (ref 150–400)
RBC: 3.72 MIL/uL — ABNORMAL LOW (ref 4.22–5.81)
RDW: 13 % (ref 11.5–15.5)
WBC: 7.3 10*3/uL (ref 4.0–10.5)

## 2017-12-03 LAB — CULTURE, BLOOD (ROUTINE X 2)
Culture: NO GROWTH
Culture: NO GROWTH
Special Requests: ADEQUATE

## 2017-12-03 LAB — BASIC METABOLIC PANEL
ANION GAP: 10 (ref 5–15)
BUN: 11 mg/dL (ref 6–20)
CHLORIDE: 101 mmol/L (ref 101–111)
CO2: 32 mmol/L (ref 22–32)
Calcium: 9.4 mg/dL (ref 8.9–10.3)
Creatinine, Ser: 0.97 mg/dL (ref 0.61–1.24)
GFR calc Af Amer: >60 mL/min (ref >60)
GLUCOSE: 92 mg/dL (ref 65–99)
POTASSIUM: 4.5 mmol/L (ref 3.5–5.1)
Sodium: 143 mmol/L (ref 135–145)

## 2017-12-03 SURGERY — APPLICATION, WOUND VAC
Anesthesia: General | Site: Groin | Laterality: Left

## 2017-12-03 MED ORDER — FENTANYL CITRATE (PF) 100 MCG/2ML IJ SOLN: 25.0000 ug | INTRAMUSCULAR | Status: DC | PRN

## 2017-12-03 MED ORDER — MIDAZOLAM HCL 2 MG/2ML IJ SOLN
INTRAMUSCULAR | Status: AC
  Filled 2017-12-03: qty 2

## 2017-12-03 MED ORDER — 0.9 % SODIUM CHLORIDE (POUR BTL) OPTIME
TOPICAL | Status: DC | PRN
  Administered 2017-12-03: 1000 mL

## 2017-12-03 MED ORDER — FENTANYL CITRATE (PF) 100 MCG/2ML IJ SOLN
INTRAMUSCULAR | Status: DC | PRN
  Administered 2017-12-03: 50 ug via INTRAVENOUS

## 2017-12-03 MED ORDER — SUGAMMADEX SODIUM 200 MG/2ML IV SOLN
INTRAVENOUS | Status: AC
  Filled 2017-12-03: qty 2

## 2017-12-03 MED ORDER — LACTATED RINGERS IV SOLN
INTRAVENOUS | Status: DC
  Administered 2017-12-03: 08:00:00 via INTRAVENOUS

## 2017-12-03 MED ORDER — LIDOCAINE 2% (20 MG/ML) 5 ML SYRINGE
INTRAMUSCULAR | Status: DC | PRN
  Administered 2017-12-03: 100 mg via INTRAVENOUS

## 2017-12-03 MED ORDER — PHENYLEPHRINE 40 MCG/ML (10ML) SYRINGE FOR IV PUSH (FOR BLOOD PRESSURE SUPPORT)
PREFILLED_SYRINGE | INTRAVENOUS | Status: AC
  Filled 2017-12-03: qty 10

## 2017-12-03 MED ORDER — ROCURONIUM BROMIDE 10 MG/ML (PF) SYRINGE
PREFILLED_SYRINGE | INTRAVENOUS | Status: AC
  Filled 2017-12-03: qty 5

## 2017-12-03 MED ORDER — ONDANSETRON HCL 4 MG/2ML IJ SOLN
INTRAMUSCULAR | Status: AC
  Filled 2017-12-03: qty 2

## 2017-12-03 MED ORDER — LIDOCAINE 2% (20 MG/ML) 5 ML SYRINGE
INTRAMUSCULAR | Status: AC
  Filled 2017-12-03: qty 5

## 2017-12-03 MED ORDER — PROPOFOL 10 MG/ML IV BOLUS
INTRAVENOUS | Status: DC | PRN
  Administered 2017-12-03: 120 mg via INTRAVENOUS

## 2017-12-03 MED ORDER — ONDANSETRON HCL 4 MG/2ML IJ SOLN
INTRAMUSCULAR | Status: DC | PRN
  Administered 2017-12-03: 4 mg via INTRAVENOUS

## 2017-12-03 MED ORDER — PROMETHAZINE HCL 25 MG/ML IJ SOLN: 6.2500 mg | INTRAMUSCULAR | Status: DC | PRN

## 2017-12-03 MED ORDER — DEXAMETHASONE SODIUM PHOSPHATE 10 MG/ML IJ SOLN
INTRAMUSCULAR | Status: AC
  Filled 2017-12-03: qty 1

## 2017-12-03 MED ORDER — SUCCINYLCHOLINE CHLORIDE 200 MG/10ML IV SOSY
PREFILLED_SYRINGE | INTRAVENOUS | Status: AC
  Filled 2017-12-03: qty 10

## 2017-12-03 MED ORDER — FENTANYL CITRATE (PF) 250 MCG/5ML IJ SOLN
INTRAMUSCULAR | Status: AC
  Filled 2017-12-03: qty 5

## 2017-12-03 MED ORDER — PROPOFOL 10 MG/ML IV BOLUS
INTRAVENOUS | Status: AC
  Filled 2017-12-03: qty 40

## 2017-12-03 SURGICAL SUPPLY — 22 items
CANISTER SUCT 3000ML PPV (MISCELLANEOUS) ×6 IMPLANT
COVER SURGICAL LIGHT HANDLE (MISCELLANEOUS) ×3 IMPLANT
DRSG VAC ATS LRG SENSATRAC (GAUZE/BANDAGES/DRESSINGS) IMPLANT
DRSG VAC ATS MED SENSATRAC (GAUZE/BANDAGES/DRESSINGS) IMPLANT
DRSG VAC ATS SM SENSATRAC (GAUZE/BANDAGES/DRESSINGS) ×2 IMPLANT
ELECT REM PT RETURN 9FT ADLT (ELECTROSURGICAL) ×3
ELECTRODE REM PT RTRN 9FT ADLT (ELECTROSURGICAL) ×1 IMPLANT
GLOVE SS BIOGEL STRL SZ 7.5 (GLOVE) ×1 IMPLANT
GLOVE SUPERSENSE BIOGEL SZ 7.5 (GLOVE) ×2
GOWN STRL REUS W/ TWL LRG LVL3 (GOWN DISPOSABLE) ×3 IMPLANT
GOWN STRL REUS W/TWL LRG LVL3 (GOWN DISPOSABLE) ×9
HANDPIECE INTERPULSE COAX TIP (DISPOSABLE)
KIT BASIN OR (CUSTOM PROCEDURE TRAY) ×3 IMPLANT
KIT TURNOVER KIT B (KITS) ×3 IMPLANT
NS IRRIG 1000ML POUR BTL (IV SOLUTION) ×3 IMPLANT
PACK GENERAL/GYN (CUSTOM PROCEDURE TRAY) ×3 IMPLANT
PACK UNIVERSAL I (CUSTOM PROCEDURE TRAY) ×2 IMPLANT
PAD ARMBOARD 7.5X6 YLW CONV (MISCELLANEOUS) ×6 IMPLANT
PAD NEG PRESSURE SENSATRAC (MISCELLANEOUS) ×3 IMPLANT
SET HNDPC FAN SPRY TIP SCT (DISPOSABLE) IMPLANT
TOWEL GREEN STERILE (TOWEL DISPOSABLE) ×3 IMPLANT
WATER STERILE IRR 1000ML POUR (IV SOLUTION) IMPLANT

## 2017-12-03 NOTE — Interval H&P Note (Signed)
History and Physical Interval Note:  12/03/2017 9:12 AM  John Ball  has presented today for surgery, with the diagnosis of groin wound  The various methods of treatment have been discussed with the patient and family. After consideration of risks, benefits and other options for treatment, the patient has consented to  Procedure(s): EXCHANGE OF WOUND VAC LEFT GROIN (Left) as a surgical intervention .  The patient's history has been reviewed, patient examined, no change in status, stable for surgery.  I have reviewed the patient's chart and labs.  Questions were answered to the patient's satisfaction.     Curt Jews

## 2017-12-03 NOTE — Anesthesia Postprocedure Evaluation (Signed)
Anesthesia Post Note  Patient: John Ball  Procedure(s) Performed: EXCHANGE OF WOUND VAC LEFT GROIN, (Left Groin)     Patient location during evaluation: PACU Anesthesia Type: General Level of consciousness: sedated Pain management: pain level controlled Vital Signs Assessment: post-procedure vital signs reviewed and stable Respiratory status: spontaneous breathing and respiratory function stable Cardiovascular status: stable Postop Assessment: no apparent nausea or vomiting Anesthetic complications: no    Last Vitals:  Vitals:   12/03/17 1030 12/03/17 1045  BP: (!) 129/54 (!) 102/59  Pulse: 80 77  Resp: 16 13  Temp:    SpO2: 98% 97%    Last Pain:  Vitals:   12/03/17 1030  TempSrc:   PainSc: 0-No pain                 Demani Weyrauch DANIEL

## 2017-12-03 NOTE — Progress Notes (Signed)
PROGRESS NOTE  John Ball ZMO:[id number] DOB: [birth year] DOA: 11/28/2017 PCP: Mellody Dance, DO   LOS: 5 days   Brief Narrative / Interim history: 77 y.o.malewith medical history significant ofCOPD, CHF, DVT, coronary artery disease status post CABG, with recent ischemic left lower extremity status post common femoral profundus endarterectomy with Dacron patch angioplasty reimplantation of native superficial femoral artery [place] patch by Dr. early on Nov 17, 2017 comes in with over 2 days of worsening swelling to the left groin area , found to have sepsis, status post I&D of left groin area by Dr. Bridgett Larsson.  Assessment & Plan: Principal Problem:   post op Wound infection Active Problems:   S/P CABG x 4   Essential hypertension   COPD (chronic obstructive pulmonary disease) (HCC)   Coronary artery disease involving coronary bypass graft of native heart with angina pectoris (HCC)   Chronic diastolic heart failure (HCC)   Bipolar I disorder (HCC)   PAD (peripheral artery disease) (HCC)   Sepsis ([place])   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)   Sepsis secondary to post op Wound infection -Sepsis criteria met on admission, elevated lactic acid, hypotensive, leukocytosis -s/p external iliac, common femoral and profundus femoral artery endarterectomy with Dacron patch angioplasty and reimplantation of native superficial femoral artery into Dacron patch on 11/17/17. -Presents with wound infection, significant leukocytosis, started on empiric IV antibiotic coverage vancomycin and Zosyn, blood culture so far with no growth to date, culture showing MRSA and he should be covered -ID consulted, appreciate input, for now continue vancomycin alone and when he is ready for discharge to start oral rifampin 300 mg twice daily for 3 additional weeks, along with Bactrim DS 1 twice daily -Vascular surgery following, to be taken to the operating room today 5/29  for further debridement.  Hopefully home tomorrow  CAD status post CABG -He denies any chest pain or shortness of breath  Hypertension -Blood pressure soft initially, medication has been held, now started to increase, back on prazosin, continue to monitor and if needed resume back on home dose of ramipril. 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 Left groin incision and drainage 2. Placement of negative pressure dressing  Antimicrobials:  Vancomycin   Subjective: -Feels well, no chest pain, no shortness of breath, awaiting surgery this morning  Objective: Vitals:   12/03/17 1015 12/03/17 1030 12/03/17 1045 12/03/17 1155  BP: (!) 149/84 (!) 129/54 (!) 102/59 140/80  Pulse: 82 80 77 88  Resp: '11 16 13   ' Temp:      TempSrc:      SpO2: 98% 98% 97% 98%  Weight:      Height:        Intake/Output Summary (Last 24 hours) at 12/03/2017 1220 Last data filed at 12/03/2017 1000 Gross per 24 hour  Intake 980 ml  Output 2595 ml  Net -1615 ml   Filed Weights   11/28/17 1833 12/03/17 0800  Weight: 93.9 kg (207 lb) 93.9 kg (207 lb)    Examination:  Constitutional: No  distress Eyes: No scleral icterus ENMT: mmm Respiratory: Clear to auscultation bilaterally without wheezing or crackles, overall distant breath sounds Cardiovascular: Regular rate and rhythm, no murmurs heard.  No peripheral edema Abdomen: Soft, nontender, nondistended, bowel sounds positive Skin: No rashes seen,  left groin wound with wound VAC in place Neurologic: non focal    Data Reviewed: I have independently reviewed following labs and imaging studies   CBC: Recent Labs  Lab 11/28/17 1116 11/30/17 0902 11/30/17 1325 12/01/17 0252 12/02/17 0539 12/03/17 0417  WBC 26.2* 10.5 10.2 9.2 6.3 7.3  NEUTROABS 24.7* 8.2*  --   --   --   --   HGB 11.8* 10.7* 10.4* 10.8* 11.4* 12.2*  HCT 36.2* 34.2* 33.4* 34.1* 35.4* 37.8*  MCV 104.0* 106.2* 105.4* 103.6* 101.7* 101.6*  PLT 185 185 192 196 198 060   Basic Metabolic Panel: Recent Labs  Lab 11/28/17 1116 11/29/17 1342 11/30/17 0250 12/01/17 0252 12/03/17 0417  NA 137 139 136 142 143  K 4.2 4.3 4.3 4.9 4.5  CL 100* 105 106 105 101  CO2 '22 24 25 31 ' 32  GLUCOSE 182* 167* 110* 99 92  BUN 35* 27* 26* 14 11  CREATININE 2.15* 1.23 1.00 0.99 0.97  CALCIUM 8.8* 8.3* 8.2* 8.7* 9.4   GFR: Estimated Creatinine Clearance: 79.5 mL/min (by C-G formula based on SCr of 0.97 mg/dL). Liver Function Tests: Recent Labs  Lab 11/28/17 1116  AST 39  ALT 20  ALKPHOS 71  BILITOT 0.7  PROT 7.5  ALBUMIN 3.7   No results for input(s): LIPASE, AMYLASE in the last 168 hours. No results for input(s): AMMONIA in the last 168 hours. Coagulation Profile: No results for input(s): INR, PROTIME in the last 168 hours. Cardiac Enzymes: No results for input(s): CKTOTAL, CKMB, CKMBINDEX, TROPONINI in the last 168 hours. BNP (last 3 results) No results for input(s): PROBNP in the last 8760 hours. HbA1C: No results for input(s): HGBA1C in the last 72 hours. CBG: Recent Labs  Lab 11/29/17 1148 11/29/17 1643  GLUCAP 146* 154*   Lipid Profile: No results for input(s): CHOL, HDL, LDLCALC, TRIG, CHOLHDL, LDLDIRECT in the last 72 hours. Thyroid Function Tests: No results for input(s): TSH, T4TOTAL, FREET4, T3FREE, THYROIDAB in the last 72 hours. Anemia Panel: No results for input(s): VITAMINB12, FOLATE, FERRITIN, TIBC, IRON, RETICCTPCT in the last 72  hours. Urine analysis:    Component Value Date/Time   COLORURINE YELLOW 11/12/2017 1344   APPEARANCEUR CLEAR 11/12/2017 1344   LABSPEC >1.046 (H) 11/12/2017 1344   PHURINE 7.0 11/12/2017 1344   GLUCOSEU NEGATIVE 11/12/2017 1344   HGBUR NEGATIVE 11/12/2017 1344   BILIRUBINUR NEGATIVE 11/12/2017 1344   KETONESUR NEGATIVE 11/12/2017 1344   PROTEINUR NEGATIVE 11/12/2017 1344   UROBILINOGEN 0.2 02/23/2010 1546   NITRITE NEGATIVE 11/12/2017 1344   LEUKOCYTESUR NEGATIVE 11/12/2017 1344   Sepsis Labs: Invalid input(s): PROCALCITONIN, LACTICIDVEN  Recent Results (from the past 240 hour(s))  Blood Culture (routine x 2)     Status: None (Preliminary result)   Collection Time: 11/28/17 11:11 AM  Result Value Ref Range Status   Specimen Description   Final    BLOOD RIGHT ANTECUBITAL Performed at [facility] [address], [place], [place] [address]    Special Requests   Final    BOTTLES DRAWN AEROBIC AND ANAEROBIC Blood Culture results may not be optimal due to an excessive volume of blood received in culture bottles Performed at [facility] [address], [place] [address]    Culture   Final    NO GROWTH 4 DAYS Performed at [facility], [place] [address] [place], [place] [address]    Report Status PENDING  Incomplete  Blood Culture (routine x 2)     Status: None (Preliminary result)   Collection Time: 11/28/17 11:16 AM  Result Value Ref Range Status   Specimen Description   Final    BLOOD LEFT ANTECUBITAL Performed at [facility] [address], [place], [place] [address]    Special Requests   Final    BOTTLES DRAWN AEROBIC AND ANAEROBIC Blood Culture adequate volume Performed at [facility] [address] [place], [place] [address]    Culture   Final    NO GROWTH 4 DAYS Performed at [facility], [place] [address], [place], [place] [address]    Report Status PENDING   Incomplete  Aerobic/Anaerobic Culture (surgical/deep wound)     Status: None (Preliminary result)   Collection Time: 11/28/17  7:40 PM  Result Value Ref Range Status   Specimen Description WOUND LEFT GROIN  Final   Special Requests PATIENT ON VANCOMYCIN  Final   Gram Stain   Final    RARE WBC PRESENT, PREDOMINANTLY PMN RARE GRAM POSITIVE COCCI IN PAIRS Performed at [facility], [place] [address], [place], [place] [address]    Culture   Final    FEW METHICILLIN RESISTANT STAPHYLOCOCCUS AUREUS NO ANAEROBES ISOLATED; CULTURE IN PROGRESS FOR 5 DAYS    Report Status PENDING  Incomplete   Organism ID, Bacteria METHICILLIN RESISTANT STAPHYLOCOCCUS AUREUS  Final      Susceptibility   Methicillin resistant staphylococcus aureus - MIC*    CIPROFLOXACIN <=0.5 SENSITIVE Sensitive     ERYTHROMYCIN >=8 RESISTANT Resistant     GENTAMICIN <=0.5 SENSITIVE Sensitive     OXACILLIN >=4 RESISTANT Resistant     TETRACYCLINE <=1 SENSITIVE Sensitive     VANCOMYCIN <=0.5 SENSITIVE Sensitive     TRIMETH/SULFA <=10 SENSITIVE Sensitive     CLINDAMYCIN <=0.25 SENSITIVE Sensitive     RIFAMPIN <=0.5 SENSITIVE Sensitive     Inducible Clindamycin NEGATIVE Sensitive     * FEW METHICILLIN RESISTANT STAPHYLOCOCCUS AUREUS      Radiology Studies: No results found.   Scheduled Meds: . clonazePAM  1 mg Oral BID  . DULoxetine  30 mg Oral Daily  . DULoxetine  60 mg Oral Daily  . gabapentin  100 mg Oral Q8H  . heparin injection (subcutaneous)  5,000 Units Subcutaneous Q8H  . mometasone-formoterol  2 puff Inhalation BID  . mupirocin cream   Topical BID  . nicotine  21 mg Transdermal Daily  . prazosin  1 mg Oral QHS  . QUEtiapine  300 mg Oral QHS  . rifampin  300 mg Oral BID WC   Continuous Infusions: . sodium chloride Stopped (11/28/17 2130)  . lactated ringers 10 mL/hr at 12/03/17 0805  . vancomycin 1,000 mg (12/03/17 0532)    Marzetta Board, MD, PhD [facility] Pager [phone]  If  7PM-7AM, please contact night-coverage [email or web address] Password [email or web address] 12/03/2017, 12:20 PM

## 2017-12-03 NOTE — Consult Note (Addendum)
WOC follow-up:  Pt is in the OR this am with Vascular surgical team for left groin wound, WOC will not need to change Vac today.   Please re-consult if further assistance is needed after surgery.  Thank-you,  Julien Girt MSN, [place], [place], [place], [place]

## 2017-12-03 NOTE — Op Note (Signed)
    OPERATIVE REPORT  DATE OF SURGERY: 12/03/2017  PATIENT: John Ball, 77 y.o. male MRN: [id number]  DOB: 23-Feb-1941  PRE-OPERATIVE DIAGNOSIS: Open groin wound left leg  POST-OPERATIVE DIAGNOSIS:  Same  PROCEDURE: Debridement of open groin wound and VAC change  SURGEON:  Curt Jews, M.D.  PHYSICIAN ASSISTANT: Nurse  ANESTHESIA: LMA  EBL: Minimal ml  Total I/O In: 500 [I.V.:500] Out: 20 [Blood:20]  BLOOD ADMINISTERED: None  DRAINS: None  SPECIMEN: None  COUNTS CORRECT:  YES  PLAN OF CARE: PACU  PATIENT DISPOSITION:  PACU - hemodynamically stable  PROCEDURE DETAILS: Patient was taken to the operative placed supine position where the area of the left groin was prepped and draped in usual sterile fashion after removing the existing VAC.  The patient had undergone an extensive endarterectomy and Dacron patch angioplasty.  He had undergone a debridement of this wound when he had returned with what appeared to be a superficial wound infection.  There was good granulation tissue at the base.  I did debride the area further with minimal necrotic debris present.  There was no evidence of communication to the Dacron patch.  The wound was irrigated with saline.  A new VAC sponge was cut to the appropriate dimension and was applied with vacuum seal the patient was transferred to the recovery room in stable condition   Rosetta Posner, M.D., [facility] 12/03/2017 10:01 AM

## 2017-12-03 NOTE — Transfer of Care (Signed)
Immediate Anesthesia Transfer of Care Note  Patient: John Ball  Procedure(s) Performed: EXCHANGE OF WOUND VAC LEFT GROIN, (Left Groin)  Patient Location: PACU  Anesthesia Type:General  Level of Consciousness: awake, alert  and oriented  Airway & Oxygen Therapy: Patient Spontanous Breathing and Patient connected to nasal cannula oxygen  Post-op Assessment: Report given to RN and Post -op Vital signs reviewed and stable  Post vital signs: Reviewed and stable  Last Vitals:  Vitals Value Taken Time  BP    Temp    Pulse 90 12/03/2017  9:56 AM  Resp    SpO2 98 % 12/03/2017  9:56 AM  Vitals shown include unvalidated device data.  Last Pain:  Vitals:   12/03/17 0427  TempSrc: Oral  PainSc:          Complications: No apparent anesthesia complications

## 2017-12-03 NOTE — Anesthesia Preprocedure Evaluation (Signed)
Anesthesia Evaluation  Patient identified by MRN, date of birth, ID band Patient awake    Reviewed: Allergy & Precautions, NPO status , Patient's Chart, lab work & pertinent test results  Airway Mallampati: I  TM Distance: >3 FB Neck ROM: Full    Dental   Pulmonary sleep apnea , COPD, Current Smoker,    Pulmonary exam normal        Cardiovascular hypertension, Pt. on medications + CAD and + CABG  Normal cardiovascular exam  Study Conclusions  - Left ventricle: The cavity size was normal. Systolic function was   at the lower limits of normal. The estimated ejection fraction   was in the range of 50% to 55%. Possible mild hypokinesis of the   inferolateral myocardium. Doppler parameters are consistent with   abnormal left ventricular relaxation (grade 1 diastolic   dysfunction).   Neuro/Psych PSYCHIATRIC DISORDERS Anxiety Depression Bipolar Disorder negative neurological ROS     GI/Hepatic negative GI ROS, Neg liver ROS,   Endo/Other  negative endocrine ROS  Renal/GU negative Renal ROS     Musculoskeletal   Abdominal   Peds  Hematology   Anesthesia Other Findings   Reproductive/Obstetrics                             Anesthesia Physical  Anesthesia Plan  ASA: III  Anesthesia Plan: General   Post-op Pain Management:    Induction: Intravenous  PONV Risk Score and Plan: 1 and Ondansetron and Treatment may vary due to age or medical condition  Airway Management Planned: Oral ETT  Additional Equipment:   Intra-op Plan:   Post-operative Plan: Extubation in OR  Informed Consent: I have reviewed the patients History and Physical, chart, labs and discussed the procedure including the risks, benefits and alternatives for the proposed anesthesia with the patient or authorized representative who has indicated his/her understanding and acceptance.     Plan Discussed with: CRNA and  Surgeon  Anesthesia Plan Comments:         Anesthesia Quick Evaluation

## 2017-12-03 NOTE — Anesthesia Procedure Notes (Signed)
Procedure Name: LMA Insertion Date/Time: 12/03/2017 9:13 AM Performed by: Lowella Dell, CRNA Pre-anesthesia Checklist: Patient identified, Emergency Drugs available, Suction available and Patient being monitored Patient Re-evaluated:Patient Re-evaluated prior to induction Oxygen Delivery Method: Circle System Utilized Preoxygenation: Pre-oxygenation with 100% oxygen Induction Type: IV induction Ventilation: Mask ventilation without difficulty LMA: LMA inserted LMA Size: 5.0 Number of attempts: 1 Placement Confirmation: positive ETCO2 Tube secured with: Tape Dental Injury: Teeth and Oropharynx as per pre-operative assessment

## 2017-12-03 NOTE — Progress Notes (Signed)
Pharmacy Antibiotic Note  John Ball is a 77 y.o. male admitted on 11/28/2017 with postop wound infection, s/p recent vascular surgery on 11/17/17. Pharmacy has been consulted for Vancomycin dosing - day #6. S/p I&D 5/29. Per Vascular notes, if stable to discharge tomorrow on PO abx recommended by ID. AKI resolving - SCr down to 0.97, good UOP.   Plan: Vancomycin 1g IV q12 hours Rifampin 300mg  PO BID per ID Monitor clinical progress, c/s, renal function, de-escalation plan/LOT Will not get vancomycin trough today as plan per Vascular is to discharge on PO abx tomorrow if stable  Height: 6' (182.9 cm) Weight: 207 lb (93.9 kg) IBW/kg (Calculated) : 77.6  Temp (24hrs), Avg:97.8 F (36.6 C), Min:97.5 F (36.4 C), Max:98.3 F (36.8 C)  Recent Labs  Lab 11/28/17 1116 11/28/17 1122 11/28/17 1302 11/29/17 1342 11/30/17 0250 11/30/17 0902 11/30/17 1325 11/30/17 1456 12/01/17 0252 12/02/17 0539 12/03/17 0417  WBC 26.2*  --   --   --   --  10.5 10.2  --  9.2 6.3 7.3  CREATININE 2.15*  --   --  1.23 1.00  --   --   --  0.99  --  0.97  LATICACIDVEN  --  2.80* 1.98*  --   --   --   --   --   --   --   --   VANCORANDOM  --   --   --   --   --   --   --  10  --   --   --     Estimated Creatinine Clearance: 79.5 mL/min (by C-G formula based on SCr of 0.97 mg/dL).    Allergies  Allergen Reactions  . Aspirin Other (See Comments)    Avoids due to severe bruises  . Crestor [Rosuvastatin Calcium] Other (See Comments)    myalgia  . Lipitor [Atorvastatin Calcium] Other (See Comments)    myalgia  . Zetia [Ezetimibe] Other (See Comments)    myalgia  . Zocor [Simvastatin - High Dose] Other (See Comments)    myalgia  . Aripiprazole Other (See Comments)    Altered mental status  . Effexor [Venlafaxine Hydrochloride] Itching  . Morphine And Related Itching    Antimicrobials this admission: 5/24 Vancomycin >> 5/24 Zosyn >>5/28 5/27 Rifampin >>  Dose adjustments this  admission: 5/26: VR = 10 increased to 1000mg  q12 hours  Microbiology results: 5/24BCx: NGTD 5/24 Wound: MRSA (sens to cipro, tetra, vanc, bactrim, and clinda)  Elicia Lamp, PharmD, BCPS Clinical Pharmacist Clinical phone for 12/03/2017 until 3:30pm: [phone] If after 3:30pm, please call main pharmacy at: [phone] 12/03/2017 11:52 AM

## 2017-12-04 ENCOUNTER — Encounter (HOSPITAL_COMMUNITY): Payer: Self-pay | Admitting: Vascular Surgery

## 2017-12-04 LAB — AEROBIC/ANAEROBIC CULTURE W GRAM STAIN (SURGICAL/DEEP WOUND)

## 2017-12-04 LAB — AEROBIC/ANAEROBIC CULTURE (SURGICAL/DEEP WOUND)

## 2017-12-04 MED ORDER — SULFAMETHOXAZOLE-TRIMETHOPRIM 800-160 MG PO TABS: 1.0000 | ORAL_TABLET | Freq: Two times a day (BID) | ORAL | 0 refills | Status: AC

## 2017-12-04 MED ORDER — RIFAMPIN 300 MG PO CAPS: 300.0000 mg | ORAL_CAPSULE | Freq: Two times a day (BID) | ORAL | 0 refills | Status: AC

## 2017-12-04 MED ORDER — OXYCODONE HCL 5 MG PO TABS: 5.0000 mg | ORAL_TABLET | Freq: Four times a day (QID) | ORAL | 0 refills | Status: DC | PRN

## 2017-12-04 MED ORDER — SULFAMETHOXAZOLE-TRIMETHOPRIM 800-160 MG PO TABS
1.0000 | ORAL_TABLET | Freq: Once | ORAL | Status: AC
  Administered 2017-12-04: 1 via ORAL
  Filled 2017-12-04: qty 1

## 2017-12-04 NOTE — Progress Notes (Signed)
Patient ID: John Ball, male   DOB: July 27, 1940, 77 y.o.   MRN: [id number] Comfortable this morning.  VAC intact with 100 cc over the last shift out.  No surrounding erythema.  Okay for discharge today from vascular standpoint.  We will see again in several weeks in the office.  Home on oral antibiotics

## 2017-12-04 NOTE — Progress Notes (Signed)
PROGRESS NOTE  John Ball:[id number] DOB: Jan 16, 1941 DOA: 11/28/2017 PCP: Mellody Dance, DO   LOS: 6 days   Brief Narrative / Interim history: 77 y.o.malewith medical history significant ofCOPD, CHF, DVT, coronary artery disease status post CABG, with recent ischemic left lower extremity status post common femoral profundus endarterectomy with Dacron patch angioplasty reimplantation of native superficial femoral artery [place] patch by Dr. early on Nov 17, 2017 comes in with over 2 days of worsening swelling to the left groin area , found to have sepsis, status post I&D of left groin area by Dr. Bridgett Larsson.  Assessment & Plan: Principal Problem:   post op Wound infection Active Problems:   S/P CABG x 4   Essential hypertension   COPD (chronic obstructive pulmonary disease) (HCC)   Coronary artery disease involving coronary bypass graft of native heart with angina pectoris (HCC)   Chronic diastolic heart failure (HCC)   Bipolar I disorder (HCC)   PAD (peripheral artery disease) (HCC)   Sepsis ([place])   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)   Sepsis secondary to post op Wound infection -Sepsis criteria met on admission, elevated lactic acid, hypotensive, leukocytosis -s/p external iliac, common femoral and profundus femoral artery endarterectomy with Dacron patch angioplasty and reimplantation of native superficial femoral artery into Dacron patch on 11/17/17. -Presents with wound infection, significant leukocytosis, started on empiric IV antibiotic coverage vancomycin and Zosyn, blood culture so far with no growth to date, culture showing MRSA and he should be covered -ID consulted, appreciate input, for now continue vancomycin alone and when he is ready for discharge to start oral rifampin 300 mg twice daily for 3 additional weeks, along with Bactrim DS 1 twice daily -Vascular surgery following, post second I&D 5/29 -stable for d/c  however wound vac for home not delivered on time so unsafe discharge. Hopefully d/c on Fri  CAD status post CABG -He denies any chest pain or shortness of breath  Hypertension -Blood pressure soft initially, medication has been held, now started to increase, back on prazosin, continue to monitor and if needed resume back on home dose of ramipril. 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 Left groin incision and drainage 2. Placement of negative pressure dressing  Antimicrobials:  Vancomycin   Subjective: - no chest pain, shortness of breath, no abdominal pain, nausea or vomiting.   Objective: Vitals:   12/04/17 0350 12/04/17 0700 12/04/17 0900 12/04/17 1100  BP: 125/64     Pulse: 76     Resp: '15 17 15 16  ' Temp: 99.6 F (37.6 C)     TempSrc: Oral     SpO2: 91%     Weight:      Height:        Intake/Output Summary (Last 24 hours) at 12/04/2017 1706 Last data filed at 12/04/2017 1057 Gross per 24 hour  Intake 1320 ml  Output 1375 ml  Net -55 ml   Filed Weights   11/28/17 1833 12/03/17 0800  Weight: 93.9 kg (207 lb) 93.9 kg (207  lb)    Examination:  Constitutional: NAD Respiratory: CTA Cardiovascular: RRR  Data Reviewed: I have independently reviewed following labs and imaging studies   CBC: Recent Labs  Lab 11/28/17 1116 11/30/17 0902 11/30/17 1325 12/01/17 0252 12/02/17 0539 12/03/17 0417  WBC 26.2* 10.5 10.2 9.2 6.3 7.3  NEUTROABS 24.7* 8.2*  --   --    --   --   HGB 11.8* 10.7* 10.4* 10.8* 11.4* 12.2*  HCT 36.2* 34.2* 33.4* 34.1* 35.4* 37.8*  MCV 104.0* 106.2* 105.4* 103.6* 101.7* 101.6*  PLT 185 185 192 196 198 676   Basic Metabolic Panel: Recent Labs  Lab 11/28/17 1116 11/29/17 1342 11/30/17 0250 12/01/17 0252 12/03/17 0417  NA 137 139 136 142 143  K 4.2 4.3 4.3 4.9 4.5  CL 100* 105 106 105 101  CO2 '22 24 25 31 ' 32  GLUCOSE 182* 167* 110* 99 92  BUN 35* 27* 26* 14 11  CREATININE 2.15* 1.23 1.00 0.99 0.97  CALCIUM 8.8* 8.3* 8.2* 8.7* 9.4   GFR: Estimated Creatinine Clearance: 79.5 mL/min (by C-G formula based on SCr of 0.97 mg/dL). Liver Function Tests: Recent Labs  Lab 11/28/17 1116  AST 39  ALT 20  ALKPHOS 71  BILITOT 0.7  PROT 7.5  ALBUMIN 3.7   No results for input(s): LIPASE, AMYLASE in the last 168 hours. No results for input(s): AMMONIA in the last 168 hours. Coagulation Profile: No results for input(s): INR, PROTIME in the last 168 hours. Cardiac Enzymes: No results for input(s): CKTOTAL, CKMB, CKMBINDEX, TROPONINI in the last 168 hours. BNP (last 3 results) No results for input(s): PROBNP in the last 8760 hours. HbA1C: No results for input(s): HGBA1C in the last 72 hours. CBG: Recent Labs  Lab 11/29/17 1148 11/29/17 1643  GLUCAP 146* 154*   Lipid Profile: No results for input(s): CHOL, HDL, LDLCALC, TRIG, CHOLHDL, LDLDIRECT in the last 72 hours. Thyroid Function Tests: No results for input(s): TSH, T4TOTAL, FREET4, T3FREE, THYROIDAB in the last 72 hours. Anemia Panel: No results for input(s): VITAMINB12, FOLATE, FERRITIN, TIBC, IRON, RETICCTPCT in the last 72 hours. Urine analysis:    Component Value Date/Time   COLORURINE YELLOW 11/12/2017 1344   APPEARANCEUR CLEAR 11/12/2017 1344   LABSPEC >1.046 (H) 11/12/2017 1344   PHURINE 7.0 11/12/2017 1344   GLUCOSEU NEGATIVE 11/12/2017 1344   HGBUR NEGATIVE 11/12/2017 1344   BILIRUBINUR NEGATIVE 11/12/2017 1344   KETONESUR NEGATIVE 11/12/2017  1344   PROTEINUR NEGATIVE 11/12/2017 1344   UROBILINOGEN 0.2 02/23/2010 1546   NITRITE NEGATIVE 11/12/2017 1344   LEUKOCYTESUR NEGATIVE 11/12/2017 1344   Sepsis Labs: Invalid input(s): PROCALCITONIN, LACTICIDVEN  Recent Results (from the past 240 hour(s))  Blood Culture (routine x 2)     Status: None   Collection Time: 11/28/17 11:11 AM  Result Value Ref Range Status   Specimen Description   Final    BLOOD RIGHT ANTECUBITAL Performed at [facility] [address] [place], [place] [address]    Special Requests   Final    BOTTLES DRAWN AEROBIC AND ANAEROBIC Blood Culture results may not be optimal due to an excessive volume of blood received in culture bottles Performed at [facility] [address], [place], [place] [address]    Culture   Final    NO GROWTH 5 DAYS Performed at [facility], [place] [address] [place], [place] [address]    Report Status 12/03/2017 FINAL  Final  Blood Culture (routine x 2)  Status: None   Collection Time: 11/28/17 11:16 AM  Result Value Ref Range Status   Specimen Description   Final    BLOOD LEFT ANTECUBITAL Performed at [facility] [address], [place], [place] [address]    Special Requests   Final    BOTTLES DRAWN AEROBIC AND ANAEROBIC Blood Culture adequate volume Performed at [facility] [address], [place], [place] [address]    Culture   Final    NO GROWTH 5 DAYS Performed at [facility], [place] [address], [place], [place] [address]    Report Status 12/03/2017 FINAL  Final  Aerobic/Anaerobic Culture (surgical/deep wound)     Status: None   Collection Time: 11/28/17  7:40 PM  Result Value Ref Range Status   Specimen Description WOUND LEFT GROIN  Final   Special Requests PATIENT ON VANCOMYCIN  Final   Gram Stain   Final    RARE WBC PRESENT, PREDOMINANTLY PMN RARE GRAM POSITIVE COCCI IN PAIRS    Culture   Final    FEW METHICILLIN  RESISTANT STAPHYLOCOCCUS AUREUS NO ANAEROBES ISOLATED Performed at [facility], [place] [address], [place], [place] [address]    Report Status 12/04/2017 FINAL  Final   Organism ID, Bacteria METHICILLIN RESISTANT STAPHYLOCOCCUS AUREUS  Final      Susceptibility   Methicillin resistant staphylococcus aureus - MIC*    CIPROFLOXACIN <=0.5 SENSITIVE Sensitive     ERYTHROMYCIN >=8 RESISTANT Resistant     GENTAMICIN <=0.5 SENSITIVE Sensitive     OXACILLIN >=4 RESISTANT Resistant     TETRACYCLINE <=1 SENSITIVE Sensitive     VANCOMYCIN <=0.5 SENSITIVE Sensitive     TRIMETH/SULFA <=10 SENSITIVE Sensitive     CLINDAMYCIN <=0.25 SENSITIVE Sensitive     RIFAMPIN <=0.5 SENSITIVE Sensitive     Inducible Clindamycin NEGATIVE Sensitive     * FEW METHICILLIN RESISTANT STAPHYLOCOCCUS AUREUS      Radiology Studies: No results found.   Scheduled Meds: . clonazePAM  1 mg Oral BID  . DULoxetine  30 mg Oral Daily  . DULoxetine  60 mg Oral Daily  . gabapentin  100 mg Oral Q8H  . heparin injection (subcutaneous)  5,000 Units Subcutaneous Q8H  . mometasone-formoterol  2 puff Inhalation BID  . mupirocin cream   Topical BID  . nicotine  21 mg Transdermal Daily  . prazosin  1 mg Oral QHS  . QUEtiapine  300 mg Oral QHS  . rifampin  300 mg Oral BID WC   Continuous Infusions: . sodium chloride Stopped (11/28/17 2130)  . lactated ringers 10 mL/hr at 12/03/17 0805  . vancomycin Stopped (12/04/17 5784)    Marzetta Board, MD, PhD [facility] Pager [phone]  If 7PM-7AM, please contact night-coverage [email or web address] Password [email or web address] 12/04/2017, 5:06 PM

## 2017-12-04 NOTE — Consult Note (Signed)
   [facility] CM Inpatient Consult   12/04/2017  John Ball 10-16-1940 [id number]   Patient was assessed for high risk score for unplanned readmission and less that 30 days in the Medicare/Next GEN ACO.   Met with the patient and wife at the bedside regarding the benefits of [facility] Care Management services. Explained that [facility] is a covered benefit of insurance. Review information for [facility] Care Management and a folder was provided with contact information.  Explained that [facility] does not interfere with or replace any services arranged by the inpatient care management staff.  Patient declined services with [facility] stating "I am suppose to have someone three times a week to take care of my wound, think that's all we need right now."  Wife states, "we've been waiting all day to get that wound pump."  Encouraged them to call a brochure, 24 hour magnet and contact information was given.  They were will for [facility] EMMI call follow up.  They endorse Dr. Mellody Dance as their primary care provider.  For questions, please contact:  Natividad Brood, RN BSN [facility] Liaison  [phone] business mobile phone Toll free office [phone]

## 2017-12-04 NOTE — Discharge Instructions (Signed)
Follow with Mellody Dance, DO in 5-7 days  Please get a complete blood count and chemistry panel checked by your Primary MD at your next visit, and again as instructed by your Primary MD. Please get your medications reviewed and adjusted by your Primary MD.  Please request your Primary MD to go over all Hospital Tests and Procedure/Radiological results at the follow up, please get all Hospital records sent to your Prim MD by signing hospital release before you go home.  If you had Pneumonia of Lung problems at the Hospital: Please get a 2 view Chest X ray done in 6-8 weeks after hospital discharge or sooner if instructed by your Primary MD.  If you have Congestive Heart Failure: Please call your Cardiologist or Primary MD anytime you have any of the following symptoms:  1) 3 pound weight gain in 24 hours or 5 pounds in 1 week  2) shortness of breath, with or without a dry hacking cough  3) swelling in the hands, feet or stomach  4) if you have to sleep on extra pillows at night in order to breathe  Follow cardiac low salt diet and 1.5 lit/day fluid restriction.  If you have diabetes Accuchecks 4 times/day, Once in AM empty stomach and then before each meal. Log in all results and show them to your primary doctor at your next visit. If any glucose reading is under 80 or above 300 call your primary MD immediately.  If you have Seizure/Convulsions/Epilepsy: Please do not drive, operate heavy machinery, participate in activities at heights or participate in high speed sports until you have seen by Primary MD or a Neurologist and advised to do so again.  If you had Gastrointestinal Bleeding: Please ask your Primary MD to check a complete blood count within one week of discharge or at your next visit. Your endoscopic/colonoscopic biopsies that are pending at the time of discharge, will also need to followed by your Primary MD.  Get Medicines reviewed and adjusted. Please take all your  medications with you for your next visit with your Primary MD  Please request your Primary MD to go over all hospital tests and procedure/radiological results at the follow up, please ask your Primary MD to get all Hospital records sent to his/her office.  If you experience worsening of your admission symptoms, develop shortness of breath, life threatening emergency, suicidal or homicidal thoughts you must seek medical attention immediately by calling 911 or calling your MD immediately  if symptoms less severe.  You must read complete instructions/literature along with all the possible adverse reactions/side effects for all the Medicines you take and that have been prescribed to you. Take any new Medicines after you have completely understood and accpet all the possible adverse reactions/side effects.   Do not drive or operate heavy machinery when taking Pain medications.   Do not take more than prescribed Pain, Sleep and Anxiety Medications  Special Instructions: If you have smoked or chewed Tobacco  in the last 2 yrs please stop smoking, stop any regular Alcohol  and or any Recreational drug use.  Wear Seat belts while driving.  Please note You were cared for by a hospitalist during your hospital stay. If you have any questions about your discharge medications or the care you received while you were in the hospital after you are discharged, you can call the unit and asked to speak with the hospitalist on call if the hospitalist that took care of you is not available. Once  you are discharged, your primary care physician will handle any further medical issues. Please note that NO REFILLS for any discharge medications will be authorized once you are discharged, as it is imperative that you return to your primary care physician (or establish a relationship with a primary care physician if you do not have one) for your aftercare needs so that they can reassess your need for medications and monitor your  lab values.  You can reach the hospitalist office at phone [phone] or fax [phone]   If you do not have a primary care physician, you can call [phone] for a physician referral.  Activity: As tolerated with Full fall precautions use walker/cane & assistance as needed  Diet: heart healthy  Disposition Home

## 2017-12-04 NOTE — Progress Notes (Signed)
Patient reported swelling in his right hand. Noticed that his IV had infiltrated while running NS and scheduled vancomycin. IV removed, pulses and sensation in right hand assessed and present. Patient complained of stinging in hand. Warm compress placed on affected hand. IV team consulted and arrived to place another IV access. Pharmacy notified that replacement bag of antibiotic needed. Will continue to monitor. Lajoyce Corners, RN

## 2017-12-04 NOTE — Progress Notes (Addendum)
Discharge Planning:  NCM spoke to [facility] rep to follow up on wound vac. States waiting for auth. Will contact NCM prior to 5 pm to see if wound vac will be delivered today. Jonnie Finner RN CCM Case Mgmt phone [phone]  12/04/2017 4:51 pm Wife states she is unable to drive at night. page to surgeon to make aware. Waiting call back. Notified attending. Jonnie Finner RN CCM Case Mgmt phone [phone]

## 2017-12-04 NOTE — Care Management Note (Signed)
Case Management Note  Patient Details  Name: John Ball MRN: [id number] Date of Birth: 1941-01-17  Subjective/Objective:  Left groin wound infection, I&D with wound vac placed                  Action/Plan: Please previous NCM notes. Faxed [facility] wound vac order. Will follow up on delivery time for Wound Vac. Pt scheduled dc home today.   Expected Discharge Date:  12/04/17               Expected Discharge Plan:  [place]  In-House Referral:    Discharge planning Services  CM Consult  Post Acute Care Choice:  Home Health, Resumption of Svcs/PTA Provider Choice offered to:  Patient  DME Arranged:  Vac DME Agency:  [facility]  HH Arranged:  RN [place] Agency:  [name]  Status of Service:  In process, will continue to follow  If discussed at Long Length of Stay Meetings, dates discussed:    Additional Comments:  Erenest Rasher, RN 12/04/2017, 1:25 PM

## 2017-12-04 NOTE — Progress Notes (Signed)
New bag of vancomycin requested from pharmacy. Has not been received yet. MD aware of situation with IV infiltration. Requests that antibiotics be given as soon as received from pharmacy. Lajoyce Corners, RN

## 2017-12-04 NOTE — Care Management Important Message (Signed)
Important Message  Patient Details  Name: ANUAR WALGREN MRN: [id number] Date of Birth: [birth year]   Medicare Important Message Given:  Yes    Erenest Rasher, RN 12/04/2017, 4:17 PM

## 2017-12-05 ENCOUNTER — Encounter: Payer: Medicare Other | Admitting: Vascular Surgery

## 2017-12-05 DIAGNOSIS — I739 Peripheral vascular disease, unspecified: Secondary | ICD-10-CM

## 2017-12-05 DIAGNOSIS — F319 Bipolar disorder, unspecified: Secondary | ICD-10-CM

## 2017-12-05 MED ORDER — SULFAMETHOXAZOLE-TRIMETHOPRIM 800-160 MG PO TABS
1.0000 | ORAL_TABLET | Freq: Once | ORAL | Status: AC
  Administered 2017-12-05: 1 via ORAL
  Filled 2017-12-05: qty 1

## 2017-12-05 NOTE — Plan of Care (Signed)
  Problem: Education: Goal: Knowledge of General Education information will improve Outcome: Adequate for Discharge   Problem: Health Behavior/Discharge Planning: Goal: Ability to manage health-related needs will improve Outcome: Adequate for Discharge   Problem: Clinical Measurements: Goal: Ability to maintain clinical measurements within normal limits will improve Outcome: Adequate for Discharge Goal: Will remain free from infection Outcome: Adequate for Discharge Goal: Diagnostic test results will improve Outcome: Adequate for Discharge Goal: Respiratory complications will improve Outcome: Adequate for Discharge Goal: Cardiovascular complication will be avoided Outcome: Adequate for Discharge   Problem: Activity: Goal: Risk for activity intolerance will decrease Outcome: Adequate for Discharge   Problem: Coping: Goal: Level of anxiety will decrease Outcome: Adequate for Discharge   Problem: Elimination: Goal: Will not experience complications related to bowel motility Outcome: Adequate for Discharge Goal: Will not experience complications related to urinary retention Outcome: Adequate for Discharge   Problem: Skin Integrity: Goal: Risk for impaired skin integrity will decrease Outcome: Adequate for Discharge

## 2017-12-05 NOTE — Progress Notes (Addendum)
  Progress Note    12/05/2017 7:39 AM 2 Days Post-Op  Subjective:  No new complaints this morning.     Vitals:   12/04/17 2145 12/05/17 0415  BP: (!) 146/79 101/86  Pulse:  99  Resp:  (!) 23  Temp:  98 F (36.7 C)  SpO2:  99%   Physical Exam: Lungs:  Non labored on [place] Incisions:  L groin with wound vac left in place, good seel, 300cc serosanguinous collection in wound vac cannister Extremities:  Feet symmetrically warm to touch with good capillary refill Abdomen:  Soft Neurologic: A&O  CBC    Component Value Date/Time   WBC 7.3 12/03/2017 0417   RBC 3.72 (L) 12/03/2017 0417   HGB 12.2 (L) 12/03/2017 0417   HGB 14.6 09/25/2017 1420   HGB 14.4 05/02/2014 1156   HCT 37.8 (L) 12/03/2017 0417   HCT 43.0 09/25/2017 1420   HCT 43.9 05/02/2014 1156   PLT 209 12/03/2017 0417   PLT 228 09/25/2017 1420   MCV 101.6 (H) 12/03/2017 0417   MCV 98 (H) 09/25/2017 1420   MCV 96.1 05/02/2014 1156   MCH 32.8 12/03/2017 0417   MCHC 32.3 12/03/2017 0417   RDW 13.0 12/03/2017 0417   RDW 14.6 09/25/2017 1420   RDW 14.1 05/02/2014 1156   LYMPHSABS 1.7 11/30/2017 0902   LYMPHSABS 1.6 05/02/2014 1156   MONOABS 0.3 11/30/2017 0902   MONOABS 0.4 05/02/2014 1156   EOSABS 0.1 11/30/2017 0902   EOSABS 0.2 05/02/2014 1156   BASOSABS 0.0 11/30/2017 0902   BASOSABS 0.0 05/02/2014 1156    BMET    Component Value Date/Time   NA 143 12/03/2017 0417   NA 143 09/25/2017 1420   NA 139 05/02/2014 1156   K 4.5 12/03/2017 0417   K 4.1 05/02/2014 1156   CL 101 12/03/2017 0417   CO2 32 12/03/2017 0417   CO2 24 05/02/2014 1156   GLUCOSE 92 12/03/2017 0417   GLUCOSE 101 05/02/2014 1156   BUN 11 12/03/2017 0417   BUN 18 09/25/2017 1420   BUN 15.7 05/02/2014 1156   CREATININE 0.97 12/03/2017 0417   CREATININE 1.21 07/07/2014 1105   CREATININE 1.1 05/02/2014 1156   CALCIUM 9.4 12/03/2017 0417   CALCIUM 9.5 05/02/2014 1156   GFRNONAA >60 12/03/2017 0417   GFRAA >60 12/03/2017 0417     INR    Component Value Date/Time   INR 1.05 11/12/2017 1344     Intake/Output Summary (Last 24 hours) at 12/05/2017 0739 Last data filed at 12/05/2017 0100 Gross per 24 hour  Intake 1200 ml  Output 1625 ml  Net -425 ml     Assessment/Plan:  77 y.o. male with surgical wound infection s/p debridement and wound vac placement in OR  2 Days Post-Op   LLE well perfused Ok for vascular standpoint for d/c home with home wound vac and HH Patient will have targeted p.o. Antibiotics at d/c F/u in office for wound check in 2 weeks   Dagoberto Ligas, PA-C [facility] [phone] 12/05/2017 7:39 AM  I have examined the patient, reviewed and agree with above.  Curt Jews, MD 12/05/2017 9:38 AM

## 2017-12-05 NOTE — Discharge Summary (Signed)
Physician Discharge Summary  John Ball QJF:[id number] DOB: Nov 19, 1940 DOA: 11/28/2017  PCP: Mellody Dance, DO  Admit date: 11/28/2017 Discharge date: 12/05/2017  Admitted From: Home Disposition: Home  Recommendations for Outpatient Follow-up:  1. Follow up with PCP in 1-2 weeks 2. Follow-up with vascular surgery Dr. early as scheduled  Home Health: PT, RN Equipment/Devices: none  Discharge Condition: stable CODE STATUS: Full code Diet recommendation: heart healthy  HPI: Per Dr. Bennie Pierini is a 77 y.o. male with medical history significant of COPD, CHF, DVT, coronary artery disease status post CABG, with recent ischemic left lower extremity status post common femoral profundus endarterectomy with Dacron patch angioplasty reimplantation of native superficial femoral artery into [place] patch by Dr. early on Nov 17, 2017 comes in with over 2 days of worsening swelling to the left groin area.  Patient went to see vascular surgery yesterday as an outpatient and he was started on some antibiotics for a developing cellulitis started on Keflex.  The area got progressively worse with more redness and draining material so came to the emergency department this morning.  Patient has been having chills and subjective fevers.  No nausea vomiting or diarrhea.  Dr. Bridgett Larsson at [facility] vascular team was called who wishes for patient to be transferred to [facility] for further evaluation.  Patient is being referred for admission for postop infection.  Hospital Course: Sepsis secondary to post op Wound infection -Sepsis criteria met on admission, elevated lactic acid, hypotensive, leukocytosis. He is s/p external iliac, common femoral and profundus femoral artery endarterectomy with Dacron patch angioplasty and reimplantation of native superficial femoral artery into Dacron patch on 11/17/17. Patient was placed on broad-spectrum antibiotics and infectious disease and vascular surgery were  consulted.  He underwent surgical I&D, operative cultures showed MRSA.  Patient was maintained on vancomycin while hospitalized and per infectious disease recommendations he was transitioned to oral rifampin 300 mg twice daily for 3 additional weeks, along with Bactrim DS 1 twice daily.  He underwent a second I&D 5/29.  He was cleared for discharge home, unfortunately there was a delay in obtaining the wound VAC and patient had to spend another night. 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 L nasal cannula at home -Stable, no wheezing, no dyspnea Chronic diastolic heart failure ([place]) -Appears compensated Bipolar I disorder (HCC) -stable PAD (peripheral artery disease) ([place]) -status post recent common femoral endarterectomy  Discharge Diagnoses:  Principal Problem:   post op Wound infection Active Problems:   S/P CABG x 4   Essential hypertension   COPD (chronic obstructive pulmonary disease) (HCC)   Coronary artery disease involving coronary bypass graft of native heart with angina pectoris (HCC)   Chronic diastolic heart failure (HCC)   Bipolar I disorder (HCC)   PAD (peripheral artery disease) ([place])   Sepsis ([place])   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)  Discharge Instructions  Allergies as of 12/05/2017      Reactions   Aspirin Other (See Comments)   Avoids due to severe bruises   Crestor [rosuvastatin Calcium] Other (See Comments)   myalgia   Lipitor [atorvastatin Calcium] Other (See Comments)   myalgia   Zetia [ezetimibe] Other (See Comments)   myalgia   Zocor [simvastatin - High Dose] Other (See Comments)   myalgia   Aripiprazole Other (See Comments)   Altered mental  status   Effexor [venlafaxine Hydrochloride] Itching   Morphine And Related Itching      Medication List    STOP taking these medications   AMBIEN  CR 12.5 MG CR tablet Generic drug:  zolpidem   cephALEXin 500 MG capsule Commonly known as:  KEFLEX     TAKE these medications   acetaminophen 500 MG tablet Commonly known as:  TYLENOL Take 1,000 mg by mouth every 8 (eight) hours as needed.   ADVAIR DISKUS 500-50 MCG/DOSE Aepb Generic drug:  Fluticasone-Salmeterol Inhale 1 puff into the lungs 2 (two) times daily.   albuterol (5 MG/ML) 0.5% nebulizer solution Commonly known as:  PROVENTIL Take 2.5 mg by nebulization every 6 (six) hours as needed for wheezing or shortness of breath.   albuterol 108 (90 Base) MCG/ACT inhaler Commonly known as:  PROVENTIL HFA;VENTOLIN HFA Inhale 1 puff into the lungs every 6 (six) hours as needed for wheezing or shortness of breath.   clonazePAM 1 MG tablet Commonly known as:  KLONOPIN Take 1 tablet by mouth 2 (two) times daily.   DULoxetine 30 MG capsule Commonly known as:  CYMBALTA Take 30-60 mg by mouth See admin instructions. 51m (2 capsules) every morning and 315m(1 capsule) daily at evening   gabapentin 100 MG capsule Commonly known as:  NEURONTIN Take 100 mg by mouth every 8 (eight) hours.   nitroGLYCERIN 0.4 MG SL tablet Commonly known as:  NITROSTAT Place 1 tablet (0.4 mg total) under the tongue every 5 (five) minutes as needed for chest pain.   oxyCODONE 5 MG immediate release tablet Commonly known as:  Oxy IR/ROXICODONE Take 1 tablet (5 mg total) by mouth every 6 (six) hours as needed for moderate pain.   OXYGEN Inhale 2 L into the lungs at bedtime. Uses O2 -- intolerant to CPAP   prazosin 1 MG capsule Commonly known as:  MINIPRESS Take 1 mg by mouth at bedtime.   QUEtiapine 300 MG 24 hr tablet Commonly known as:  SEROQUEL XR Take 300 mg by mouth at bedtime.   ramipril 2.5 MG capsule Commonly known as:  ALTACE Take 2.5 mg by mouth every morning.   rifampin 300 MG capsule Commonly known as:  RIFADIN Take 1 capsule (300 mg total) by mouth 2 (two) times daily with a  meal for 21 days.   SPIRIVA HANDIHALER 18 MCG inhalation capsule Generic drug:  tiotropium Place 18 mcg into inhaler and inhale daily.   sulfamethoxazole-trimethoprim 800-160 MG tablet Commonly known as:  BACTRIM DS,SEPTRA DS Take 1 tablet by mouth 2 (two) times daily for 21 days.   traZODone 150 MG tablet Commonly known as:  DESYREL Take 0.5 tablets (75 mg total) by mouth at bedtime as needed for sleep. What changed:    how much to take  when to take this   Vitamin D3 1000 units Caps Take 1 capsule by mouth every morning.            Durable Medical Equipment  (From admission, onward)        Start     Ordered   12/02/17 0728  For home use only DME Negative pressure wound device  Once    Question Answer Comment  Frequency of dressing change 3 times per week   Length of need 3 Months   Dressing type Foam   Amount of suction 125 mm/Hg   Pressure application Continuous pressure   Supplies 10 canisters and 15 dressings per month for duration of therapy  12/02/17 0177     Follow-up Information    Early, Arvilla Meres, MD Follow up in 2 week(s).   Specialties:  Vascular Surgery, Cardiology Contact information: [place] [address] [phone]        Mellody Dance, DO. Schedule an appointment as soon as possible for a visit in 3 week(s).   Specialty:  Family Medicine Contact information: [place] [address] [phone]        [facility], [name] Follow up.   Specialty:  [place]:  Home Health RN -agency will call to arrange initial appointment Contact information: [place] [address] [phone]           Consultations:  Vascular surgery   ID  Procedures/Studies: 1. Left groin incision and drainage 2. Placement of negative pressure dressing  Dg Chest Port 1 View  Result Date: 11/28/2017 CLINICAL DATA:  Sepsis EXAM: PORTABLE CHEST 1 VIEW COMPARISON:  09/22/2017  FINDINGS: Cardiac shadow is within normal limits. Postsurgical changes are noted. Elevation right hemidiaphragm is again seen with blunting of the right costophrenic angle stable from the prior exam. No focal infiltrate or sizable effusion is seen. No bony abnormality is noted. IMPRESSION: Chronic changes without acute abnormality. Electronically Signed   By: Inez Catalina M.D.   On: 11/28/2017 11:32      Subjective: - no chest pain, shortness of breath, no abdominal pain, nausea or vomiting.  Upset that he had to stay another night because of issues with the wound VAC  Discharge Exam: Vitals:   12/04/17 2145 12/05/17 0415  BP: (!) 146/79 101/86  Pulse:  99  Resp:  (!) 23  Temp:  98 F (36.7 C)  SpO2:  99%    General: Pt is alert, awake, not in acute distress Cardiovascular: RRR, S1/S2 +, no rubs, no gallops Respiratory: CTA bilaterally, no wheezing, no rhonchi Abdominal: Soft, NT, ND, bowel sounds + Extremities: no edema, no cyanosis    The results of significant diagnostics from this hospitalization (including imaging, microbiology, ancillary and laboratory) are listed below for reference.     Microbiology: Recent Results (from the past 240 hour(s))  Blood Culture (routine x 2)     Status: None   Collection Time: 11/28/17 11:11 AM  Result Value Ref Range Status   Specimen Description   Final    BLOOD RIGHT ANTECUBITAL Performed at [facility] [address] [place], [place] [address]    Special Requests   Final    BOTTLES DRAWN AEROBIC AND ANAEROBIC Blood Culture results may not be optimal due to an excessive volume of blood received in culture bottles Performed at [facility] [address] [place], [place] [address]    Culture   Final    NO GROWTH 5 DAYS Performed at [facility], [place] [address] [place], [place] [address]    Report Status 12/03/2017 FINAL  Final  Blood Culture (routine x 2)     Status: None    Collection Time: 11/28/17 11:16 AM  Result Value Ref Range Status   Specimen Description   Final    BLOOD LEFT ANTECUBITAL Performed at [facility] [address], [place], [place] [address]    Special Requests   Final    BOTTLES DRAWN AEROBIC AND ANAEROBIC Blood Culture adequate volume Performed at [facility] [address], [place], [place] [address]    Culture  Final    NO GROWTH 5 DAYS Performed at [facility], [place] [address], [place], [place] [address]    Report Status 12/03/2017 FINAL  Final  Aerobic/Anaerobic Culture (surgical/deep wound)     Status: None   Collection Time: 11/28/17  7:40 PM  Result Value Ref Range Status   Specimen Description WOUND LEFT GROIN  Final   Special Requests PATIENT ON VANCOMYCIN  Final   Gram Stain   Final    RARE WBC PRESENT, PREDOMINANTLY PMN RARE GRAM POSITIVE COCCI IN PAIRS    Culture   Final    FEW METHICILLIN RESISTANT STAPHYLOCOCCUS AUREUS NO ANAEROBES ISOLATED Performed at [facility], [place] [address], [place], [place] [address]    Report Status 12/04/2017 FINAL  Final   Organism ID, Bacteria METHICILLIN RESISTANT STAPHYLOCOCCUS AUREUS  Final      Susceptibility   Methicillin resistant staphylococcus aureus - MIC*    CIPROFLOXACIN <=0.5 SENSITIVE Sensitive     ERYTHROMYCIN >=8 RESISTANT Resistant     GENTAMICIN <=0.5 SENSITIVE Sensitive     OXACILLIN >=4 RESISTANT Resistant     TETRACYCLINE <=1 SENSITIVE Sensitive     VANCOMYCIN <=0.5 SENSITIVE Sensitive     TRIMETH/SULFA <=10 SENSITIVE Sensitive     CLINDAMYCIN <=0.25 SENSITIVE Sensitive     RIFAMPIN <=0.5 SENSITIVE Sensitive     Inducible Clindamycin NEGATIVE Sensitive     * FEW METHICILLIN RESISTANT STAPHYLOCOCCUS AUREUS     Labs: BNP (last 3 results) Recent Labs    09/12/17 1500  BNP 46.8   Basic Metabolic Panel: Recent Labs  Lab 11/29/17 1342 11/30/17 0250 12/01/17 0252 12/03/17 0417  NA 139 136 142  143  K 4.3 4.3 4.9 4.5  CL 105 106 105 101  CO2 _0 32  GLUCOSE 167* 110* 99 92  BUN 27* 26* 14 11  CREATININE 1.23 1.00 0.99 0.97  CALCIUM 8.3* 8.2* 8.7* 9.4   Liver Function Tests: No results for input(s): AST, ALT, ALKPHOS, BILITOT, PROT, ALBUMIN in the last 168 hours. No results for input(s): LIPASE, AMYLASE in the last 168 hours. No results for input(s): AMMONIA in the last 168 hours. CBC: Recent Labs  Lab 11/30/17 0902 11/30/17 1325 12/01/17 0252 12/02/17 0539 12/03/17 0417  WBC 10.5 10.2 9.2 6.3 7.3  NEUTROABS 8.2*  --   --   --   --   HGB 10.7* 10.4* 10.8* 11.4* 12.2*  HCT 34.2* 33.4* 34.1* 35.4* 37.8*  MCV 106.2* 105.4* 103.6* 101.7* 101.6*  PLT 185 192 196 198 209   Cardiac Enzymes: No results for input(s): CKTOTAL, CKMB, CKMBINDEX, TROPONINI in the last 168 hours. BNP: Invalid input(s): POCBNP CBG: Recent Labs  Lab 11/29/17 1148 11/29/17 1643  GLUCAP 146* 154*   D-Dimer No results for input(s): DDIMER in the last 72 hours. Hgb A1c No results for input(s): HGBA1C in the last 72 hours. Lipid Profile No results for input(s): CHOL, HDL, LDLCALC, TRIG, CHOLHDL, LDLDIRECT in the last 72 hours. Thyroid function studies No results for input(s): TSH, T4TOTAL, T3FREE, THYROIDAB in the last 72 hours.  Invalid input(s): FREET3 Anemia work up No results for input(s): VITAMINB12, FOLATE, FERRITIN, TIBC, IRON, RETICCTPCT in the last 72 hours. Urinalysis    Component Value Date/Time   COLORURINE YELLOW 11/12/2017 1344   APPEARANCEUR CLEAR 11/12/2017 1344   LABSPEC >1.046 (H) 11/12/2017 1344   PHURINE 7.0 11/12/2017 1344   GLUCOSEU NEGATIVE 11/12/2017 1344   HGBUR NEGATIVE 11/12/2017 1344   BILIRUBINUR  NEGATIVE 11/12/2017 1344   KETONESUR NEGATIVE 11/12/2017 1344   PROTEINUR NEGATIVE 11/12/2017 1344   UROBILINOGEN 0.2 02/23/2010 1546   NITRITE NEGATIVE 11/12/2017 1344   LEUKOCYTESUR NEGATIVE 11/12/2017 1344   Sepsis Labs Invalid input(s):  PROCALCITONIN,  WBC,  LACTICIDVEN   Time coordinating discharge: 40 minutes  SIGNED:  Marzetta Board, MD  [facility] 12/05/2017, 3:24 PM Pager [phone]  If 7PM-7AM, please contact night-coverage [email or web address] Password [email or web address]

## 2017-12-05 NOTE — Progress Notes (Signed)
Wound VAC changed over to portable at home machine by wife and she was able to perform with moderate verbal queues. Suction set to 150mmHg continuous suction and air leaks show "low" on machine. All discharge instructions reviewed with patient and wife and questions answered. Prescriptions given. Patient agitated at end of session wanting to know when he can get a cigarette and was verbally explosive in hallway about "wanting to get out of here". He was transported via wheelchair by tech and unit nurse to family vehicle with [employer] box of VAC supplies. Wife states [name] is to make home visit today to review VAC instructions and get [place] set up.

## 2017-12-05 NOTE — Consult Note (Signed)
[place] Nurse wound consult note Reason for Consult: Full thickness post-op wound to left groin Measurement: 7.5X.4.5X1cm Wound bed: red and moist Drainage (amount, consistency, odor) mod amt yellow drainage in the cannister, no odor Periwound: intact skin surrounding Dressing procedure/placement/frequency: Applied barrier ring to assist with maintaining a seal, and Mepitel contact layer to decrease discomfort with next dressing change, and one piece black foam to 136mm cont suction.  Pt tolerated with mod amt discomfort and pain meds given prior to the procedure.  Pt plans to discharge with home health assistance today, according to the EMR. Julien Girt MSN, RN, [place], [place], [place]

## 2017-12-05 NOTE — Progress Notes (Addendum)
08:30 Spoke w [employer] rep, VAC has been approved and it will be delivered to room this morning.  09:45 Faxed latest [place] note and progress note to [employer] at [phone], verified VAC will be delivered this AM. Spoke w Tiffany at [name] to update patient will DC today and will have next [facility] dressing change due Monday. 10:30 Spoke w patient and wife at bedside, updated that [facility] will be delivered today and that [name] will follow for Home Health.

## 2017-12-06 DIAGNOSIS — A4902 Methicillin resistant Staphylococcus aureus infection, unspecified site: Secondary | ICD-10-CM | POA: Diagnosis not present

## 2017-12-06 DIAGNOSIS — T8140XD Infection following a procedure, unspecified, subsequent encounter: Secondary | ICD-10-CM | POA: Diagnosis not present

## 2017-12-06 DIAGNOSIS — I110 Hypertensive heart disease with heart failure: Secondary | ICD-10-CM | POA: Diagnosis not present

## 2017-12-06 DIAGNOSIS — F319 Bipolar disorder, unspecified: Secondary | ICD-10-CM | POA: Diagnosis not present

## 2017-12-06 DIAGNOSIS — J449 Chronic obstructive pulmonary disease, unspecified: Secondary | ICD-10-CM | POA: Diagnosis not present

## 2017-12-06 DIAGNOSIS — I5032 Chronic diastolic (congestive) heart failure: Secondary | ICD-10-CM | POA: Diagnosis not present

## 2017-12-08 DIAGNOSIS — I5032 Chronic diastolic (congestive) heart failure: Secondary | ICD-10-CM | POA: Diagnosis not present

## 2017-12-08 DIAGNOSIS — I110 Hypertensive heart disease with heart failure: Secondary | ICD-10-CM | POA: Diagnosis not present

## 2017-12-08 DIAGNOSIS — J449 Chronic obstructive pulmonary disease, unspecified: Secondary | ICD-10-CM | POA: Diagnosis not present

## 2017-12-08 DIAGNOSIS — F319 Bipolar disorder, unspecified: Secondary | ICD-10-CM | POA: Diagnosis not present

## 2017-12-08 DIAGNOSIS — T8140XD Infection following a procedure, unspecified, subsequent encounter: Secondary | ICD-10-CM | POA: Diagnosis not present

## 2017-12-08 DIAGNOSIS — A4902 Methicillin resistant Staphylococcus aureus infection, unspecified site: Secondary | ICD-10-CM | POA: Diagnosis not present

## 2017-12-10 ENCOUNTER — Other Ambulatory Visit: Payer: Self-pay

## 2017-12-10 DIAGNOSIS — I110 Hypertensive heart disease with heart failure: Secondary | ICD-10-CM | POA: Diagnosis not present

## 2017-12-10 DIAGNOSIS — I5032 Chronic diastolic (congestive) heart failure: Secondary | ICD-10-CM | POA: Diagnosis not present

## 2017-12-10 DIAGNOSIS — T8140XD Infection following a procedure, unspecified, subsequent encounter: Secondary | ICD-10-CM | POA: Diagnosis not present

## 2017-12-10 DIAGNOSIS — A4902 Methicillin resistant Staphylococcus aureus infection, unspecified site: Secondary | ICD-10-CM | POA: Diagnosis not present

## 2017-12-10 DIAGNOSIS — J449 Chronic obstructive pulmonary disease, unspecified: Secondary | ICD-10-CM | POA: Diagnosis not present

## 2017-12-10 DIAGNOSIS — F319 Bipolar disorder, unspecified: Secondary | ICD-10-CM | POA: Diagnosis not present

## 2017-12-10 NOTE — Patient Outreach (Signed)
[facility]) Care Management  12/10/2017  John Ball 1941/05/25 [id number]     EMMI-General Discharge RED ON EMMI ALERT Day # 4 Date: 12/09/17 Red Alert Reason: "Lost interest in things? Yes"   Outreach attempt # 1 to patient.Spoke with spouse. She voices that she is answering the automated calls and handles patient's affairs. Reviewed and addressed red alert with spouse. She denies patient having these feelings. She voiced that he is acting his normal self. RN CM confirmed that MD follow up appts re in place. Spouse denies any issues with transportation. She voiced that patient has all his meds and no issues or concerns regarding them. [name] was out to see patient on Monday and will be coming later today as well. Spouse denies any RN CM needs or concerns at this time. Patient has completed post discharge automated calls.        Plan: RN CM will close case at this time as no further interventions needed.   Enzo Montgomery, RN,BSN,CCM [facility] Management Telephonic Care Management Coordinator Direct Phone: [phone] Toll Free: [phone] Fax: [phone]

## 2017-12-12 DIAGNOSIS — I5032 Chronic diastolic (congestive) heart failure: Secondary | ICD-10-CM | POA: Diagnosis not present

## 2017-12-12 DIAGNOSIS — I110 Hypertensive heart disease with heart failure: Secondary | ICD-10-CM | POA: Diagnosis not present

## 2017-12-12 DIAGNOSIS — A4902 Methicillin resistant Staphylococcus aureus infection, unspecified site: Secondary | ICD-10-CM | POA: Diagnosis not present

## 2017-12-12 DIAGNOSIS — J449 Chronic obstructive pulmonary disease, unspecified: Secondary | ICD-10-CM | POA: Diagnosis not present

## 2017-12-12 DIAGNOSIS — F319 Bipolar disorder, unspecified: Secondary | ICD-10-CM | POA: Diagnosis not present

## 2017-12-12 DIAGNOSIS — T8140XD Infection following a procedure, unspecified, subsequent encounter: Secondary | ICD-10-CM | POA: Diagnosis not present

## 2017-12-15 DIAGNOSIS — F319 Bipolar disorder, unspecified: Secondary | ICD-10-CM | POA: Diagnosis not present

## 2017-12-15 DIAGNOSIS — T8140XD Infection following a procedure, unspecified, subsequent encounter: Secondary | ICD-10-CM | POA: Diagnosis not present

## 2017-12-15 DIAGNOSIS — I5032 Chronic diastolic (congestive) heart failure: Secondary | ICD-10-CM | POA: Diagnosis not present

## 2017-12-15 DIAGNOSIS — A4902 Methicillin resistant Staphylococcus aureus infection, unspecified site: Secondary | ICD-10-CM | POA: Diagnosis not present

## 2017-12-15 DIAGNOSIS — J449 Chronic obstructive pulmonary disease, unspecified: Secondary | ICD-10-CM | POA: Diagnosis not present

## 2017-12-15 DIAGNOSIS — I110 Hypertensive heart disease with heart failure: Secondary | ICD-10-CM | POA: Diagnosis not present

## 2017-12-16 ENCOUNTER — Ambulatory Visit (INDEPENDENT_AMBULATORY_CARE_PROVIDER_SITE_OTHER): Payer: Self-pay | Admitting: Vascular Surgery

## 2017-12-16 ENCOUNTER — Encounter: Payer: Self-pay | Admitting: Vascular Surgery

## 2017-12-16 ENCOUNTER — Other Ambulatory Visit: Payer: Self-pay

## 2017-12-16 VITALS — BP 119/75 | HR 88 | Temp 97.4°F | Resp 20 | Ht 72.0 in | Wt 207.0 lb

## 2017-12-16 DIAGNOSIS — I779 Disorder of arteries and arterioles, unspecified: Secondary | ICD-10-CM

## 2017-12-16 MED ORDER — HYDROGEL GEL: 1.0000 g | Freq: Every day | 0 refills | Status: DC

## 2017-12-16 NOTE — Progress Notes (Signed)
    Postoperative Visit   History of Present Illness   John Ball is a 77 y.o. year old male who presents for postoperative follow-up for wound check of left groin.  He is status post left external iliac, common femoral, and profundus femoral endarterectomy with Dacron patch angioplasty and reimplantation of native superficial femoral artery by Dr. Donnetta Hutching on 11/17/2017.  He was readmitted to the hospital due to surgical site infection and underwent debridement by Dr. Bridgett Larsson on 11/28/2017 and subsequent exploration and exchange of wound VAC by Dr. early on 12/03/2017.  He was discharged with a home wound VAC with home health changing VAC and dressing 3 times a week.  He denies rest pain or active tissue ischemia of left foot.  The patient and wife believe left great toe ulceration has improved.  He is taking aspirin daily.    For VQI Use Only   PRE-ADM LIVING: Home  AMB STATUS: Ambulatory   Physical Examination   Vitals:   12/16/17 1554  BP: 119/75  Pulse: 88  Resp: 20  Temp: (!) 97.4 F (36.3 C)  TempSrc: Oral  SpO2: 99%  Weight: 207 lb (93.9 kg)  Height: 6' (1.829 m)    LLE: Left groin healing well; 5 x 1 x 0.5 cm with healthy wound bed and no purulence or other sign of infection; left great toe ulceration dry without sign of infection; brisk left PTA by Doppler   Medical Decision Making   John Ball is a 77 y.o. year old male who presents s/p re-exploration of L groin s/p left external iliac, common femoral, and profundus femoral endarterectomy with Dacron patch angioplasty and reimplantation of native superficial femoral artery by Dr. Donnetta Hutching on 11/17/2017   Patient was evaluated alongside Dr. Donnetta Hutching  Left groin incision is much improved since discharge from hospital  Patient is perfusing left foot with dopplerable left PTA  We will discontinue wound VAC  Hydrogel was prescribed and will be used with a dry dressing daily  Patient will follow-up in 3 weeks to  recheck groin incision with Dr. Donnetta Hutching  Patient and wife were made aware to call or return to office if incision worsens or patient develops any active tissue ischemia or rest pain  Dagoberto Ligas [facility] [place] Office: [phone] Pager: [phone]

## 2017-12-17 DIAGNOSIS — T8140XD Infection following a procedure, unspecified, subsequent encounter: Secondary | ICD-10-CM | POA: Diagnosis not present

## 2017-12-17 DIAGNOSIS — I110 Hypertensive heart disease with heart failure: Secondary | ICD-10-CM | POA: Diagnosis not present

## 2017-12-17 DIAGNOSIS — A4902 Methicillin resistant Staphylococcus aureus infection, unspecified site: Secondary | ICD-10-CM | POA: Diagnosis not present

## 2017-12-17 DIAGNOSIS — I5032 Chronic diastolic (congestive) heart failure: Secondary | ICD-10-CM | POA: Diagnosis not present

## 2017-12-17 DIAGNOSIS — F319 Bipolar disorder, unspecified: Secondary | ICD-10-CM | POA: Diagnosis not present

## 2017-12-17 DIAGNOSIS — J449 Chronic obstructive pulmonary disease, unspecified: Secondary | ICD-10-CM | POA: Diagnosis not present

## 2017-12-19 DIAGNOSIS — F319 Bipolar disorder, unspecified: Secondary | ICD-10-CM | POA: Diagnosis not present

## 2017-12-19 DIAGNOSIS — A4902 Methicillin resistant Staphylococcus aureus infection, unspecified site: Secondary | ICD-10-CM | POA: Diagnosis not present

## 2017-12-19 DIAGNOSIS — I5032 Chronic diastolic (congestive) heart failure: Secondary | ICD-10-CM | POA: Diagnosis not present

## 2017-12-19 DIAGNOSIS — T8140XD Infection following a procedure, unspecified, subsequent encounter: Secondary | ICD-10-CM | POA: Diagnosis not present

## 2017-12-19 DIAGNOSIS — I110 Hypertensive heart disease with heart failure: Secondary | ICD-10-CM | POA: Diagnosis not present

## 2017-12-19 DIAGNOSIS — J449 Chronic obstructive pulmonary disease, unspecified: Secondary | ICD-10-CM | POA: Diagnosis not present

## 2017-12-22 DIAGNOSIS — J449 Chronic obstructive pulmonary disease, unspecified: Secondary | ICD-10-CM | POA: Diagnosis not present

## 2017-12-22 DIAGNOSIS — I110 Hypertensive heart disease with heart failure: Secondary | ICD-10-CM | POA: Diagnosis not present

## 2017-12-22 DIAGNOSIS — T8140XD Infection following a procedure, unspecified, subsequent encounter: Secondary | ICD-10-CM | POA: Diagnosis not present

## 2017-12-22 DIAGNOSIS — F319 Bipolar disorder, unspecified: Secondary | ICD-10-CM | POA: Diagnosis not present

## 2017-12-22 DIAGNOSIS — A4902 Methicillin resistant Staphylococcus aureus infection, unspecified site: Secondary | ICD-10-CM | POA: Diagnosis not present

## 2017-12-22 DIAGNOSIS — I5032 Chronic diastolic (congestive) heart failure: Secondary | ICD-10-CM | POA: Diagnosis not present

## 2017-12-26 DIAGNOSIS — F319 Bipolar disorder, unspecified: Secondary | ICD-10-CM | POA: Diagnosis not present

## 2017-12-26 DIAGNOSIS — J449 Chronic obstructive pulmonary disease, unspecified: Secondary | ICD-10-CM | POA: Diagnosis not present

## 2017-12-26 DIAGNOSIS — I110 Hypertensive heart disease with heart failure: Secondary | ICD-10-CM | POA: Diagnosis not present

## 2017-12-26 DIAGNOSIS — A4902 Methicillin resistant Staphylococcus aureus infection, unspecified site: Secondary | ICD-10-CM | POA: Diagnosis not present

## 2017-12-26 DIAGNOSIS — I5032 Chronic diastolic (congestive) heart failure: Secondary | ICD-10-CM | POA: Diagnosis not present

## 2017-12-26 DIAGNOSIS — T8140XD Infection following a procedure, unspecified, subsequent encounter: Secondary | ICD-10-CM | POA: Diagnosis not present

## 2017-12-29 DIAGNOSIS — F319 Bipolar disorder, unspecified: Secondary | ICD-10-CM | POA: Diagnosis not present

## 2017-12-29 DIAGNOSIS — I110 Hypertensive heart disease with heart failure: Secondary | ICD-10-CM | POA: Diagnosis not present

## 2017-12-29 DIAGNOSIS — T8140XD Infection following a procedure, unspecified, subsequent encounter: Secondary | ICD-10-CM | POA: Diagnosis not present

## 2017-12-29 DIAGNOSIS — J449 Chronic obstructive pulmonary disease, unspecified: Secondary | ICD-10-CM | POA: Diagnosis not present

## 2017-12-29 DIAGNOSIS — A4902 Methicillin resistant Staphylococcus aureus infection, unspecified site: Secondary | ICD-10-CM | POA: Diagnosis not present

## 2017-12-29 DIAGNOSIS — I5032 Chronic diastolic (congestive) heart failure: Secondary | ICD-10-CM | POA: Diagnosis not present

## 2018-01-05 DIAGNOSIS — I5032 Chronic diastolic (congestive) heart failure: Secondary | ICD-10-CM | POA: Diagnosis not present

## 2018-01-05 DIAGNOSIS — T8140XD Infection following a procedure, unspecified, subsequent encounter: Secondary | ICD-10-CM | POA: Diagnosis not present

## 2018-01-05 DIAGNOSIS — J449 Chronic obstructive pulmonary disease, unspecified: Secondary | ICD-10-CM | POA: Diagnosis not present

## 2018-01-05 DIAGNOSIS — I110 Hypertensive heart disease with heart failure: Secondary | ICD-10-CM | POA: Diagnosis not present

## 2018-01-05 DIAGNOSIS — A4902 Methicillin resistant Staphylococcus aureus infection, unspecified site: Secondary | ICD-10-CM | POA: Diagnosis not present

## 2018-01-05 DIAGNOSIS — F319 Bipolar disorder, unspecified: Secondary | ICD-10-CM | POA: Diagnosis not present

## 2018-01-06 ENCOUNTER — Ambulatory Visit (INDEPENDENT_AMBULATORY_CARE_PROVIDER_SITE_OTHER): Payer: Self-pay | Admitting: Vascular Surgery

## 2018-01-06 ENCOUNTER — Encounter: Payer: Self-pay | Admitting: Vascular Surgery

## 2018-01-06 ENCOUNTER — Other Ambulatory Visit: Payer: Self-pay

## 2018-01-06 VITALS — BP 140/78 | HR 76 | Temp 97.6°F | Resp 20 | Ht 72.0 in | Wt 195.3 lb

## 2018-01-06 DIAGNOSIS — I779 Disorder of arteries and arterioles, unspecified: Secondary | ICD-10-CM

## 2018-01-06 NOTE — Progress Notes (Signed)
Patient name: John Ball MRN: [id number] DOB: Aug 31, 1940 Sex: male  REASON FOR VISIT: Follow-up left femoral endarterectomy and postop wound infection  HPI: John Ball is a 77 y.o. male here today for follow-up.  He had undergone a left femoral endarterectomy and Dacron angioplasty in [place].  He presented with drainage and opening of his left groin several weeks postop and went back for debridement and VAC placement on 12/03/2017.  He has had excellent healing of this and is been taking hydrogel.  He has had no fevers.  He does continue to have some pain in his left foot  Current Outpatient Medications  Medication Sig Dispense Refill  . acetaminophen (TYLENOL) 500 MG tablet Take 1,000 mg by mouth every 8 (eight) hours as needed.    Marland Kitchen ADVAIR DISKUS 500-50 MCG/DOSE AEPB Inhale 1 puff into the lungs 2 (two) times daily.     Marland Kitchen albuterol (PROVENTIL HFA;VENTOLIN HFA) 108 (90 BASE) MCG/ACT inhaler Inhale 1 puff into the lungs every 6 (six) hours as needed for wheezing or shortness of breath.    Marland Kitchen albuterol (PROVENTIL) (5 MG/ML) 0.5% nebulizer solution Take 2.5 mg by nebulization every 6 (six) hours as needed for wheezing or shortness of breath.    . Carbomer Gel Base (HYDROGEL) GEL 1 g by Does not apply route daily. 100 g 0  . Cholecalciferol (VITAMIN D3) 1000 UNITS CAPS Take 1 capsule by mouth every morning.    . clonazePAM (KLONOPIN) 1 MG tablet Take 1 tablet by mouth 2 (two) times daily.    . DULoxetine (CYMBALTA) 30 MG capsule Take 30-60 mg by mouth See admin instructions. 60mg  (2 capsules) every morning and 30mg  (1 capsule) daily at evening    . gabapentin (NEURONTIN) 100 MG capsule Take 100 mg by mouth every 8 (eight) hours.     . nitroGLYCERIN (NITROSTAT) 0.4 MG SL tablet Place 1 tablet (0.4 mg total) under the tongue every 5 (five) minutes as needed for chest pain. 25 tablet 6  . oxyCODONE (OXY IR/ROXICODONE) 5 MG immediate release tablet Take 1  tablet (5 mg total) by mouth every 6 (six) hours as needed for moderate pain. 12 tablet 0  . OXYGEN Inhale 2 L into the lungs at bedtime. Uses O2 -- intolerant to CPAP    . prazosin (MINIPRESS) 1 MG capsule Take 1 mg by mouth at bedtime.    Marland Kitchen QUEtiapine (SEROQUEL XR) 300 MG 24 hr tablet Take 300 mg by mouth at bedtime.     . ramipril (ALTACE) 2.5 MG capsule Take 2.5 mg by mouth every morning.     . tiotropium (SPIRIVA HANDIHALER) 18 MCG inhalation capsule Place 18 mcg into inhaler and inhale daily.    . traZODone (DESYREL) 150 MG tablet Take 0.5 tablets (75 mg total) by mouth at bedtime as needed for sleep. (Patient taking differently: Take 150 mg by mouth at bedtime. ) 10 tablet 0   No current facility-administered medications for this visit.      PHYSICAL EXAM: Vitals:   01/06/18 0951  BP: 140/78  Pulse: 76  Resp: 20  Temp: 97.6 F (36.4 C)  TempSrc: Oral  SpO2: (!) 9%  Weight: 195 lb 4.8 oz (88.6 kg)  Height: 6' (1.829 m)    GENERAL: The patient is a well-nourished male, in no acute distress. The vital signs are documented above. Dependent rubor of his left foot. Left groin is nearly completely healed with a small area of granulation tissue at the lower  pole of this.  As an excellent pulse.  Right leg reveals easily palpable in situ femoral to posterior tibial bypass.  MEDICAL ISSUES: Stable overall.  Will continue hydrogel at the very lower pole of his incision until it is completely healed.  He reports that he is able to take several Tylenol when he is awakened at night with discomfort.  Reports this is tolerable.  He has healed the ulceration on the first metatarsal head.  He does have a very good Doppler flow in his left posterior tibial artery.  He will continue his usual activities.  We will see him again in 3 months with ankle arm index   Rosetta Posner, MD [facility] Tel [phone] Pager [phone]

## 2018-01-12 ENCOUNTER — Other Ambulatory Visit: Payer: Self-pay

## 2018-01-12 DIAGNOSIS — I779 Disorder of arteries and arterioles, unspecified: Secondary | ICD-10-CM

## 2018-02-03 ENCOUNTER — Telehealth: Payer: Self-pay | Admitting: *Deleted

## 2018-02-03 NOTE — Telephone Encounter (Signed)
Call from patient c/o left foot pain 8/10 since last office visit 01/06/18. Claims foot is warm and pink +sensation. Abrasions on toes and "spot of side of left foot". Tylenol for pain because wife states she does not want him to get hooked on pain med(.Has had a problem in the past according to wife).  Only relief is with elevation of extremity. Agreeable to see PA on 02/06/18. Instructed to report to the [facility] ER for any a.cute or worsening condition.

## 2018-02-06 ENCOUNTER — Encounter: Payer: Self-pay | Admitting: *Deleted

## 2018-02-06 ENCOUNTER — Ambulatory Visit (INDEPENDENT_AMBULATORY_CARE_PROVIDER_SITE_OTHER): Payer: Medicare Other | Admitting: Physician Assistant

## 2018-02-06 ENCOUNTER — Other Ambulatory Visit: Payer: Self-pay

## 2018-02-06 ENCOUNTER — Other Ambulatory Visit: Payer: Self-pay | Admitting: Surgery

## 2018-02-06 VITALS — BP 117/73 | HR 92 | Temp 97.1°F | Resp 18 | Ht 72.0 in | Wt 188.0 lb

## 2018-02-06 DIAGNOSIS — I779 Disorder of arteries and arterioles, unspecified: Secondary | ICD-10-CM | POA: Diagnosis not present

## 2018-02-06 NOTE — Progress Notes (Signed)
History of Present Illness:  Patient is a 77 y.o. year old male who presents for evaluation of increased left foot pain, edema and erythema.  2-3 weeks ago he caught his foot under the screen door and cause scraps on the dorsum of his toes on the left foot.   He states the pain is preventing him from sleeping at night and walking as much as he usually does.      He had undergone a left femoral endarterectomy and Dacron angioplasty in early May.  He presented with drainage and opening of his left groin several weeks postop and went back for debridement and VAC placement on 12/03/2017.  He has had excellent healing of this and is been taking hydrogel. This has healed.    He was last seen on 01/06/2018 by Dr. Donnetta Hutching deemed stable condition.  His arterial blood flow was stable with  a very good Doppler flow in his left posterior tibial artery.    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  Superficial femoral artery is patent proximally occludes in the mid thigh reconstitutes a heavily calcified above-knee popliteal.  Below the knee popliteal does appear to be free of calcification but the tibioperoneal trunk is possibly disease.  The anterior tibial artery is occluded.  The dominant runoff to the foot is via the posterior tibial artery which appears free of disease.   Past medical history:  He continues to smoke daily, he uses O2 2 L at home, He is not on anticoagulation medications       Past Medical History:  Diagnosis Date  . Anxiety   . Arthritis   . Complication of anesthesia    hx prolonged post op oxygen dependent/  hx hallucinations for 2 day post op lobectomy 2012  . COPD, severe ([place])   . Depression   . Essential hypertension   . Hearing loss of both ears    does wear his hearing aids  . History of arterial bypass of lower extremity    RIGHT  FEM-POP  . History of CHF (congestive heart failure)   . History of DVT of lower extremity   . History of panic attacks   . Hyperlipidemia with target LDL less than 70   . Left main coronary artery disease 2004   a) Severe LM CAD 2004 -->s/p  cabg x4 (LIMA-LAD, free RIMA-RI, SVG-OM 2, SVG-RPDA); b) 06/2014: Abnormal Myoview --> c)cardiac Cath Jan 2016: pLM 70%, pLAD 40-50%- patent LIMA-LAD.  RI -competitive flow noted from RIMA-RI graft, circumflex, normal caliber with moderate OM1.  OM 2 occluded.  SVG-OM 2 patent. RCA CTO after large RV M, faint R-R and LAD septal-PDA collaterals =--> new  . Neuropathy, peripheral   . 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 Nocturia   . OSA (obstructive sleep apnea)    uses O2  via [place]  --  intolerate to CPAP  . Peripheral arterial occlusive disease ([place])    2011   a) Gore-Tex graft right AK popA-BK popA --> b) 2012: thrombosed graft --> thrombectomy with dacron patch = 1 V runnof via Peroneal; c) 01/2014: R femoropopliteal bypass with left femoral vein;; followed by dr Early--  per last dopplers 06-28-2014 no change right graft,  50-74% stenosis common and left mid superficial femoral artery  . PTSD (post-traumatic  stress disorder)    anxiety attack's---  [place] [employer]  . Recurrent productive cough    SMOKER'S COUGH  . 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  Medical management  . Smoker unmotivated to quit    Reportedly he came close to having a nervous breakdown when he last tried to quit  . 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 Wears glasses     Past Surgical History:  Procedure Laterality Date  . ABDOMINAL AORTOGRAM N/A 11/12/2017   Procedure: ABDOMINAL AORTOGRAM;  Surgeon: Waynetta Sandy, MD;  Location: [facility];  Service: Cardiovascular;  Laterality: N/A;  . APPLICATION OF WOUND VAC Left 12/03/2017   Procedure: EXCHANGE OF WOUND VAC LEFT GROIN,;  Surgeon: Rosetta Posner, MD;  Location: [facility];  Service: Vascular;  Laterality: Left;  . CARDIAC CATHETERIZATION  07-13-2002   Ischemia RCA region on Cardiolite// 60-70% ostial left main, 50% midCx, 60-70% mid RI,  70% mRCA, 90% JeNu and crux 75% RCA ,  95% PLA//  severe LM and 3Vessel CAD, perserved LV, ef 60%  . CARDIOVASCULAR STRESS TEST  last one  06-17-2014   dr  Ellyn Hack   Carlton Adam study with no exercise; Intermediate Risk Study;   moderate size and intensity, partially reversible inferior septal defect consistent with prior infarct and mild to moderate peri-infarct ischemia;  mild hypokinesis of the inferior septal wall,  normal LV function, ef 56%  . COLONOSCOPY  2008    WNL  . CORONARY ARTERY BYPASS GRAFT  07/27/2002   LIMA-LAD, freeRIMA-RI,SVG-OM2, SVG-rPDA; Dr. Roxan Hockey  . CYSTOSCOPY W/ URETERAL STENT PLACEMENT N/A 02/21/2015   Procedure: BILATERAL RETROGRADE PYELOGRAM;  Surgeon: Festus Aloe, MD;  Location: [facility];  Service: Urology;  Laterality: N/A;  . ENDARTERECTOMY FEMORAL Left 11/17/2017   Procedure: LEFT Femoral Endarterectomy with Patch Angioplasty;  Surgeon: Rosetta Posner, MD;  Location: [facility];  Service: Vascular;  Laterality: Left;  . EYE SURGERY Left Nov 2013  . FEMORAL ARTERY - POPLITEAL ARTERY BYPASS GRAFT Right 10-24-2008   dr early   w/ GoreTex graft  . FEMORAL-TIBIAL BYPASS GRAFT Right 01/10/2014   Procedure: Right Femoral to Posterior Tibial Bypass Graft using  Left Leg Vein, Thrombectomy Right Common Femoral and Profunda of Leg . ;  Surgeon: Rosetta Posner, MD;  Location: [facility];  Service: Vascular;  Laterality: Right;  . HYDROCELE EXCISION Left   . LEFT ANKLE SURGERY  1990's  . LEFT HEART CATHETERIZATION WITH CORONARY/GRAFT ANGIOGRAM N/A 07/12/2014   Procedure: LEFT HEART CATHETERIZATION WITH Beatrix Fetters;  Surgeon: Troy Sine, MD;  Location: [facility];  Service: Cardiovascular;  Laterality: N/A;  pLM 70%, pLAD 40-50%- patent LIMA-LAD.  RI -competitive flow noted from fRIMA-RI graft, circumflex, normal caliber with moderate OM1.  OM 2 occluded.  SVG-OM 2 patent. RCA CTO after large RVM,  faint R-R and LAD septal-PDA collateral  . LOWER EXTREMITY ANGIOGRAPHY Bilateral 11/12/2017   Procedure: LOWER EXTREMITY ANGIOGRAPHY - Left Lower;  Surgeon: Waynetta Sandy, MD;  Location: [facility];  Service: Cardiovascular;  Laterality: Bilateral;  . NM MYOVIEW LTD  05/2014   Moderate sized, moderate intensity/partially reversible inferior defect consistent with prior infarct mild/moderate peri-infarct ischemia.  Inferoseptal HK.  INTERMEDIATE RISK. --> Cath showed CTO of Native RCA & SVG-rPDA.  Marland Kitchen RIGHT ABOVE KNEE POPLITEAL GRAFT TO BELOW KNEE POPLITEAL ARTERY BYPASS WITH REVERSE SAPHENOUS VEIN  03-02-2010  . THROMBECTOMY OF RIGHT FEMORAL TO ABOVE KNEE POPLITEAL GORETEX GRAFT ANGIOPLASTY OF GORETEX AND SAPHENOUS VEIN JUNCTION AND ABOVE KNEE POPLITEAL ARTERY   10-06-2010   DR EARLY  . TONSILLECTOMY    . TRANSURETHRAL RESECTION OF BLADDER TUMOR N/A 02/21/2015   Procedure: TRANSURETHRAL RESECTION OF BLADDER TUMOR (TURBT);  Surgeon: Festus Aloe, MD;  Location: [facility];  Service: Urology;  Laterality: N/A;  . VIDEO ASSISTED THORACOSCOPY (VATS)/WEDGE RESECTION Right 05-01-2011    [facility]TH   LOWER LOBECTOMY W/  NODE DISSECTION  . WOUND EXPLORATION Left 11/28/2017   Procedure: WOUND EXPLORATION GROIN, WASHOUT AND WOUND VAC  APPLICATION;  Surgeon: Conrad McKinnon, MD;  Location: [facility];  Service: Vascular;  Laterality: Left;    ROS:   General:  No weight loss, Fever, chills  HEENT: No recent headaches, no nasal bleeding, no visual changes, no sore throat  Neurologic: No dizziness, blackouts, seizures. No recent symptoms of stroke or mini- stroke. No recent episodes of slurred speech, or temporary blindness.  Cardiac: No recent episodes of chest pain/pressure, no shortness of breath at rest.  No shortness of breath with exertion.  Denies history of atrial fibrillation or irregular heartbeat  Vascular: No history of rest pain in feet.  No history of claudication.  positve history of non-healing ulcer, No history of DVT   Pulmonary: positive home oxygen, no productive cough, no hemoptysis,  No asthma or wheezing  Musculoskeletal:  [ ]  Arthritis, [ ]  Low back pain,  [x ] Joint pain  Hematologic:No history of hypercoagulable state.  No history of easy bleeding.  No history of anemia  Gastrointestinal: No hematochezia or melena,  No gastroesophageal reflux, no trouble swallowing  Urinary: [ ]  chronic Kidney disease, [ ]  on HD - [ ]  MWF or [ ]  TTHS, [ ]  Burning with urination, [ ]  Frequent urination, [ ]  Difficulty urinating;   Skin: No rashes  Psychological: No history of anxiety,  No history of depression  Social History Social History   Tobacco Use  . Smoking status: Current Every Day Smoker    Packs/day: 0.50    Years: 55.00    Pack years: 27.50    Types: Cigarettes  . Smokeless tobacco: Never Used  Substance Use Topics  . Alcohol use: No  . Drug use: No    Family History Family History  Problem Relation Age of Onset  . Other Mother 12       Abdominal Aortic Aneurysm   . Stroke Mother   . Alzheimer's disease Father 22  . Stroke Father     Allergies  Allergies  Allergen Reactions  . Aspirin Other (See Comments)    Avoids due to severe bruises  . Crestor [Rosuvastatin Calcium] Other  (See Comments)    myalgia  . Lipitor [Atorvastatin Calcium] Other (See Comments)    myalgia  . Zetia [Ezetimibe] Other (See Comments)    myalgia  .  Zocor [Simvastatin - High Dose] Other (See Comments)    myalgia  . Aripiprazole Other (See Comments)    Altered mental status  . Effexor [Venlafaxine Hydrochloride] Itching  . Morphine And Related Itching     Current Outpatient Medications  Medication Sig Dispense Refill  . acetaminophen (TYLENOL) 500 MG tablet Take 1,000 mg by mouth every 8 (eight) hours as needed.    Marland Kitchen ADVAIR DISKUS 500-50 MCG/DOSE AEPB Inhale 1 puff into the lungs 2 (two) times daily.     Marland Kitchen albuterol (PROVENTIL HFA;VENTOLIN HFA) 108 (90 BASE) MCG/ACT inhaler Inhale 1 puff into the lungs every 6 (six) hours as needed for wheezing or shortness of breath.    Marland Kitchen albuterol (PROVENTIL) (5 MG/ML) 0.5% nebulizer solution Take 2.5 mg by nebulization every 6 (six) hours as needed for wheezing or shortness of breath.    . Carbomer Gel Base (HYDROGEL) GEL 1 g by Does not apply route daily. 100 g 0  . Cholecalciferol (VITAMIN D3) 1000 UNITS CAPS Take 1 capsule by mouth every morning.    . clonazePAM (KLONOPIN) 1 MG tablet Take 1 tablet by mouth 2 (two) times daily.    . DULoxetine (CYMBALTA) 30 MG capsule Take 30-60 mg by mouth See admin instructions. 60mg  (2 capsules) every morning and 30mg  (1 capsule) daily at evening    . gabapentin (NEURONTIN) 100 MG capsule Take 100 mg by mouth every 8 (eight) hours.     . nitroGLYCERIN (NITROSTAT) 0.4 MG SL tablet Place 1 tablet (0.4 mg total) under the tongue every 5 (five) minutes as needed for chest pain. 25 tablet 6  . oxyCODONE (OXY IR/ROXICODONE) 5 MG immediate release tablet Take 1 tablet (5 mg total) by mouth every 6 (six) hours as needed for moderate pain. 12 tablet 0  . OXYGEN Inhale 2 L into the lungs at bedtime. Uses O2 -- intolerant to CPAP    . prazosin (MINIPRESS) 1 MG capsule Take 1 mg by mouth at bedtime.    Marland Kitchen QUEtiapine  (SEROQUEL XR) 300 MG 24 hr tablet Take 300 mg by mouth at bedtime.     . ramipril (ALTACE) 2.5 MG capsule Take 2.5 mg by mouth every morning.     . tiotropium (SPIRIVA HANDIHALER) 18 MCG inhalation capsule Place 18 mcg into inhaler and inhale daily.    . traZODone (DESYREL) 150 MG tablet Take 0.5 tablets (75 mg total) by mouth at bedtime as needed for sleep. (Patient taking differently: Take 150 mg by mouth at bedtime. ) 10 tablet 0   No current facility-administered medications for this visit.     Physical Examination  Vitals:   02/06/18 1343  BP: 117/73  Pulse: 92  Resp: 18  Temp: (!) 97.1 F (36.2 C)  TempSrc: Oral  SpO2: 98%  Weight: 188 lb (85.3 kg)  Height: 6' (1.829 m)    Body mass index is 25.5 kg/m.  General:  Alert and oriented, no acute distress HEENT: Normal, normocephalic Neck: No bruit or JVD Pulmonary: Rhonchi right LL Cardiac: Regular Rate and Rhythm without murmur Abdomen: Soft, non-tender, non-distended, + BS Skin: No rash, Dependent rubor of his left foot, edema, black eschar dry gangrene toes   First MT head callus center red base without drainage chronic.   Extremity Pulses:  2+ radial, brachial, femoral,  pulses bilaterally Doppler monophasic PT left,   Neurologic: Upper and lower extremity motor grossly intact     ASSESSMENT:  PAD  S/P left  femoral endarterectomy and Dacron angioplasty  Dry gangrene toes 2-5 left foot after traumatic injury 2-3 weeks ago with increased pain.  PLAN:  He is scheduled for angiogram with left LE runoff and possible intervention with Dr. Trula Slade 02/10/2018.  He has pain medication at home if he needs it he is afraid to take narcotics, but he states he may have to for this.  His wife was with him today and they agree with this plan.     Roxy Horseman PA-C [facility] Office: [phone]

## 2018-02-10 ENCOUNTER — Encounter (HOSPITAL_COMMUNITY)
Admission: RE | Disposition: A | Discharge status: Home / Self Care | Payer: Self-pay | Source: Ambulatory Visit | Attending: Surgery

## 2018-02-10 ENCOUNTER — Inpatient Hospital Stay (HOSPITAL_COMMUNITY)
Admission: RE | Admit: 2018-02-10 | Discharge: 2018-02-12 | DRG: 254 | Disposition: A | Discharge status: Home / Self Care | Payer: Medicare Other | Source: Ambulatory Visit | Attending: Vascular Surgery | Admitting: Vascular Surgery

## 2018-02-10 ENCOUNTER — Other Ambulatory Visit: Payer: Self-pay

## 2018-02-10 DIAGNOSIS — Z8551 Personal history of malignant neoplasm of bladder: Secondary | ICD-10-CM

## 2018-02-10 DIAGNOSIS — I739 Peripheral vascular disease, unspecified: Secondary | ICD-10-CM

## 2018-02-10 DIAGNOSIS — I998 Other disorder of circulatory system: Secondary | ICD-10-CM | POA: Diagnosis not present

## 2018-02-10 DIAGNOSIS — H9193 Unspecified hearing loss, bilateral: Secondary | ICD-10-CM | POA: Diagnosis present

## 2018-02-10 DIAGNOSIS — G4733 Obstructive sleep apnea (adult) (pediatric): Secondary | ICD-10-CM | POA: Diagnosis present

## 2018-02-10 DIAGNOSIS — Z820 Family history of epilepsy and other diseases of the nervous system: Secondary | ICD-10-CM

## 2018-02-10 DIAGNOSIS — Z86718 Personal history of other venous thrombosis and embolism: Secondary | ICD-10-CM

## 2018-02-10 DIAGNOSIS — Z9981 Dependence on supplemental oxygen: Secondary | ICD-10-CM

## 2018-02-10 DIAGNOSIS — G629 Polyneuropathy, unspecified: Secondary | ICD-10-CM | POA: Diagnosis present

## 2018-02-10 DIAGNOSIS — M1990 Unspecified osteoarthritis, unspecified site: Secondary | ICD-10-CM | POA: Diagnosis present

## 2018-02-10 DIAGNOSIS — Z886 Allergy status to analgesic agent status: Secondary | ICD-10-CM

## 2018-02-10 DIAGNOSIS — I10 Essential (primary) hypertension: Secondary | ICD-10-CM | POA: Diagnosis present

## 2018-02-10 DIAGNOSIS — L97529 Non-pressure chronic ulcer of other part of left foot with unspecified severity: Secondary | ICD-10-CM | POA: Diagnosis present

## 2018-02-10 DIAGNOSIS — F17210 Nicotine dependence, cigarettes, uncomplicated: Secondary | ICD-10-CM | POA: Diagnosis present

## 2018-02-10 DIAGNOSIS — J449 Chronic obstructive pulmonary disease, unspecified: Secondary | ICD-10-CM | POA: Diagnosis present

## 2018-02-10 DIAGNOSIS — E785 Hyperlipidemia, unspecified: Secondary | ICD-10-CM | POA: Diagnosis present

## 2018-02-10 DIAGNOSIS — Z974 Presence of external hearing-aid: Secondary | ICD-10-CM

## 2018-02-10 DIAGNOSIS — Z951 Presence of aortocoronary bypass graft: Secondary | ICD-10-CM

## 2018-02-10 DIAGNOSIS — I70262 Atherosclerosis of native arteries of extremities with gangrene, left leg: Principal | ICD-10-CM | POA: Diagnosis present

## 2018-02-10 DIAGNOSIS — Z823 Family history of stroke: Secondary | ICD-10-CM

## 2018-02-10 DIAGNOSIS — I2510 Atherosclerotic heart disease of native coronary artery without angina pectoris: Secondary | ICD-10-CM | POA: Diagnosis present

## 2018-02-10 DIAGNOSIS — Z85118 Personal history of other malignant neoplasm of bronchus and lung: Secondary | ICD-10-CM

## 2018-02-10 DIAGNOSIS — F4310 Post-traumatic stress disorder, unspecified: Secondary | ICD-10-CM | POA: Diagnosis present

## 2018-02-10 HISTORY — PX: ABDOMINAL AORTOGRAM W/LOWER EXTREMITY: CATH118223

## 2018-02-10 HISTORY — PX: PERIPHERAL VASCULAR INTERVENTION: CATH118257

## 2018-02-10 LAB — POCT I-STAT, CHEM 8
BUN: 20 mg/dL (ref 8–23)
CREATININE: 1 mg/dL (ref 0.61–1.24)
Calcium, Ion: 1.21 mmol/L (ref 1.15–1.40)
Chloride: 103 mmol/L (ref 98–111)
Glucose, Bld: 88 mg/dL (ref 70–99)
HCT: 40 % (ref 39.0–52.0)
HEMOGLOBIN: 13.6 g/dL (ref 13.0–17.0)
Potassium: 4.3 mmol/L (ref 3.5–5.1)
Sodium: 139 mmol/L (ref 135–145)
TCO2: 25 mmol/L (ref 22–32)

## 2018-02-10 LAB — POCT ACTIVATED CLOTTING TIME
Activated Clotting Time: 208 seconds
Activated Clotting Time: 230 seconds
Activated Clotting Time: 169 seconds

## 2018-02-10 SURGERY — ABDOMINAL AORTOGRAM W/LOWER EXTREMITY
Anesthesia: LOCAL | Laterality: Left

## 2018-02-10 MED ORDER — ONDANSETRON HCL 4 MG/2ML IJ SOLN: 4.0000 mg | Freq: Four times a day (QID) | INTRAMUSCULAR | Status: DC | PRN

## 2018-02-10 MED ORDER — LIDOCAINE HCL (PF) 1 % IJ SOLN
INTRAMUSCULAR | Status: DC | PRN
  Administered 2018-02-10: 10 mL

## 2018-02-10 MED ORDER — IODIXANOL 320 MG/ML IV SOLN
INTRAVENOUS | Status: DC | PRN
  Administered 2018-02-10: 190 mL via INTRA_ARTERIAL

## 2018-02-10 MED ORDER — ACETAMINOPHEN 500 MG PO TABS: 1000.0000 mg | ORAL_TABLET | Freq: Four times a day (QID) | ORAL | Status: DC | PRN

## 2018-02-10 MED ORDER — MOMETASONE FURO-FORMOTEROL FUM 200-5 MCG/ACT IN AERO
2.0000 | INHALATION_SPRAY | Freq: Two times a day (BID) | RESPIRATORY_TRACT | Status: DC
  Administered 2018-02-11 – 2018-02-12 (×2): 2 via RESPIRATORY_TRACT
  Filled 2018-02-10: qty 8.8

## 2018-02-10 MED ORDER — ALBUTEROL SULFATE (5 MG/ML) 0.5% IN NEBU: 2.5000 mg | INHALATION_SOLUTION | Freq: Four times a day (QID) | RESPIRATORY_TRACT | Status: DC | PRN

## 2018-02-10 MED ORDER — ZOLPIDEM TARTRATE 5 MG PO TABS
5.0000 mg | ORAL_TABLET | Freq: Every evening | ORAL | Status: DC | PRN
  Administered 2018-02-11: 5 mg via ORAL
  Filled 2018-02-10: qty 1

## 2018-02-10 MED ORDER — QUETIAPINE FUMARATE ER 300 MG PO TB24
300.0000 mg | ORAL_TABLET | Freq: Every day | ORAL | Status: DC
  Administered 2018-02-10 – 2018-02-11 (×2): 300 mg via ORAL
  Filled 2018-02-10 (×2): qty 1

## 2018-02-10 MED ORDER — HEPARIN (PORCINE) IN NACL 1000-0.9 UT/500ML-% IV SOLN
INTRAVENOUS | Status: DC | PRN
  Administered 2018-02-10 (×2): 500 mL

## 2018-02-10 MED ORDER — FENTANYL CITRATE (PF) 100 MCG/2ML IJ SOLN
INTRAMUSCULAR | Status: AC
  Filled 2018-02-10: qty 2

## 2018-02-10 MED ORDER — HYDRALAZINE HCL 20 MG/ML IJ SOLN: 5.0000 mg | INTRAMUSCULAR | Status: DC | PRN

## 2018-02-10 MED ORDER — OXYCODONE HCL 5 MG PO TABS
5.0000 mg | ORAL_TABLET | ORAL | Status: DC | PRN
  Administered 2018-02-10: 10 mg via ORAL
  Administered 2018-02-10: 5 mg via ORAL
  Administered 2018-02-11 – 2018-02-12 (×3): 10 mg via ORAL
  Filled 2018-02-10: qty 2
  Filled 2018-02-10: qty 1
  Filled 2018-02-10 (×3): qty 2

## 2018-02-10 MED ORDER — ACETAMINOPHEN 325 MG PO TABS: 650.0000 mg | ORAL_TABLET | ORAL | Status: DC | PRN

## 2018-02-10 MED ORDER — SODIUM CHLORIDE 0.9 % IV SOLN
INTRAVENOUS | Status: DC
  Administered 2018-02-10: 11:00:00 via INTRAVENOUS

## 2018-02-10 MED ORDER — GABAPENTIN 100 MG PO CAPS
100.0000 mg | ORAL_CAPSULE | Freq: Every day | ORAL | Status: DC
  Administered 2018-02-10 – 2018-02-11 (×2): 100 mg via ORAL
  Filled 2018-02-10 (×2): qty 1

## 2018-02-10 MED ORDER — NITROGLYCERIN 0.4 MG SL SUBL: 0.4000 mg | SUBLINGUAL_TABLET | SUBLINGUAL | Status: DC | PRN

## 2018-02-10 MED ORDER — RAMIPRIL 2.5 MG PO CAPS
2.5000 mg | ORAL_CAPSULE | Freq: Every morning | ORAL | Status: DC
  Administered 2018-02-11: 2.5 mg via ORAL
  Filled 2018-02-10 (×2): qty 1

## 2018-02-10 MED ORDER — TIOTROPIUM BROMIDE MONOHYDRATE 18 MCG IN CAPS
18.0000 ug | ORAL_CAPSULE | Freq: Every day | RESPIRATORY_TRACT | Status: DC
  Administered 2018-02-11 – 2018-02-12 (×2): 18 ug via RESPIRATORY_TRACT
  Filled 2018-02-10: qty 5

## 2018-02-10 MED ORDER — MORPHINE SULFATE (PF) 2 MG/ML IV SOLN
2.0000 mg | Freq: Once | INTRAVENOUS | Status: AC
  Administered 2018-02-10: 2 mg via INTRAVENOUS

## 2018-02-10 MED ORDER — MIDAZOLAM HCL 2 MG/2ML IJ SOLN
INTRAMUSCULAR | Status: AC
  Filled 2018-02-10: qty 2

## 2018-02-10 MED ORDER — PRAZOSIN HCL 1 MG PO CAPS
1.0000 mg | ORAL_CAPSULE | Freq: Every day | ORAL | Status: DC
  Administered 2018-02-10 – 2018-02-11 (×2): 1 mg via ORAL
  Filled 2018-02-10 (×2): qty 1

## 2018-02-10 MED ORDER — LIDOCAINE HCL (PF) 1 % IJ SOLN
INTRAMUSCULAR | Status: AC
  Filled 2018-02-10: qty 30

## 2018-02-10 MED ORDER — TRAZODONE HCL 150 MG PO TABS
75.0000 mg | ORAL_TABLET | Freq: Every evening | ORAL | Status: DC | PRN
  Filled 2018-02-10: qty 1

## 2018-02-10 MED ORDER — SODIUM CHLORIDE 0.9 % WEIGHT BASED INFUSION
1.0000 mL/kg/h | INTRAVENOUS | Status: AC
  Administered 2018-02-10: 1 mL/kg/h via INTRAVENOUS

## 2018-02-10 MED ORDER — MIDAZOLAM HCL 2 MG/2ML IJ SOLN
INTRAMUSCULAR | Status: DC | PRN
  Administered 2018-02-10: 1 mg via INTRAVENOUS
  Administered 2018-02-10: 2 mg via INTRAVENOUS
  Administered 2018-02-10: 1 mg via INTRAVENOUS
  Administered 2018-02-10: 2 mg via INTRAVENOUS

## 2018-02-10 MED ORDER — HEPARIN (PORCINE) IN NACL 1000-0.9 UT/500ML-% IV SOLN
INTRAVENOUS | Status: AC
  Filled 2018-02-10: qty 1000

## 2018-02-10 MED ORDER — HEPARIN SODIUM (PORCINE) 1000 UNIT/ML IJ SOLN
INTRAMUSCULAR | Status: DC | PRN
  Administered 2018-02-10: 9000 [IU] via INTRAVENOUS

## 2018-02-10 MED ORDER — SODIUM CHLORIDE 0.9% FLUSH
3.0000 mL | Freq: Two times a day (BID) | INTRAVENOUS | Status: DC
  Administered 2018-02-11 (×3): 3 mL via INTRAVENOUS

## 2018-02-10 MED ORDER — SODIUM CHLORIDE 0.9% FLUSH: 3.0000 mL | INTRAVENOUS | Status: DC | PRN

## 2018-02-10 MED ORDER — VITAMIN D 1000 UNITS PO TABS: 1000.0000 [IU] | ORAL_TABLET | Freq: Every morning | ORAL | Status: DC

## 2018-02-10 MED ORDER — DULOXETINE HCL 60 MG PO CPEP
60.0000 mg | ORAL_CAPSULE | Freq: Every day | ORAL | Status: DC
  Administered 2018-02-11: 60 mg via ORAL
  Filled 2018-02-10: qty 1

## 2018-02-10 MED ORDER — ALBUTEROL SULFATE (2.5 MG/3ML) 0.083% IN NEBU
2.5000 mg | INHALATION_SOLUTION | Freq: Four times a day (QID) | RESPIRATORY_TRACT | Status: DC | PRN
Start: 2018-02-10 — End: 2018-02-12

## 2018-02-10 MED ORDER — DULOXETINE HCL 30 MG PO CPEP
30.0000 mg | ORAL_CAPSULE | Freq: Every day | ORAL | Status: DC
  Administered 2018-02-10 – 2018-02-11 (×2): 30 mg via ORAL
  Filled 2018-02-10 (×2): qty 1

## 2018-02-10 MED ORDER — MORPHINE SULFATE (PF) 2 MG/ML IV SOLN
2.0000 mg | INTRAVENOUS | Status: DC | PRN
  Administered 2018-02-10 – 2018-02-11 (×4): 2 mg via INTRAVENOUS
  Filled 2018-02-10 (×4): qty 1

## 2018-02-10 MED ORDER — LABETALOL HCL 5 MG/ML IV SOLN: 10.0000 mg | INTRAVENOUS | Status: DC | PRN

## 2018-02-10 MED ORDER — HEPARIN SODIUM (PORCINE) 1000 UNIT/ML IJ SOLN
INTRAMUSCULAR | Status: AC
  Filled 2018-02-10: qty 1

## 2018-02-10 MED ORDER — CLONAZEPAM 1 MG PO TABS
1.0000 mg | ORAL_TABLET | Freq: Two times a day (BID) | ORAL | Status: DC
  Administered 2018-02-10 – 2018-02-11 (×3): 1 mg via ORAL
  Filled 2018-02-10 (×3): qty 1

## 2018-02-10 MED ORDER — SODIUM CHLORIDE 0.9 % IV SOLN: 250.0000 mL | INTRAVENOUS | Status: DC | PRN

## 2018-02-10 MED ORDER — FENTANYL CITRATE (PF) 100 MCG/2ML IJ SOLN
INTRAMUSCULAR | Status: DC | PRN
  Administered 2018-02-10 (×2): 50 ug via INTRAVENOUS
  Administered 2018-02-10: 25 ug via INTRAVENOUS
  Administered 2018-02-10: 50 ug via INTRAVENOUS
  Administered 2018-02-10: 25 ug via INTRAVENOUS

## 2018-02-10 MED ORDER — MORPHINE SULFATE (PF) 2 MG/ML IV SOLN
INTRAVENOUS | Status: AC
  Filled 2018-02-10: qty 1

## 2018-02-10 SURGICAL SUPPLY — 25 items
CATH OMNI FLUSH 5F 65CM (CATHETERS) ×2 IMPLANT
CATH QUICKCROSS .035X135CM (MICROCATHETER) ×2 IMPLANT
CATH QUICKCROSS SUPP .035X90CM (MICROCATHETER) ×2 IMPLANT
DEVICE TORQUE .025-.038 (MISCELLANEOUS) ×2 IMPLANT
DEVICE TORQUE H2O (MISCELLANEOUS) ×2 IMPLANT
DRAPE ZERO GRAVITY STERILE (DRAPES) ×2 IMPLANT
GLIDEWIRE ADV .035X260CM (WIRE) ×2 IMPLANT
GUIDEWIRE ANGLED .035X150CM (WIRE) ×2 IMPLANT
GUIDEWIRE ANGLED .035X260CM (WIRE) ×2 IMPLANT
KIT ENCORE 26 ADVANTAGE (KITS) ×2 IMPLANT
KIT MICROPUNCTURE NIT STIFF (SHEATH) ×2 IMPLANT
KIT PV (KITS) ×2 IMPLANT
SHEATH AVANTI 11CM 8FR (SHEATH) ×2 IMPLANT
SHEATH HIGHFLEX ANSEL 7FR 55CM (SHEATH) ×2 IMPLANT
SHEATH PINNACLE 5F 10CM (SHEATH) ×2 IMPLANT
SHEATH PROBE COVER 6X72 (BAG) ×2 IMPLANT
STENT EXPRESS LD 9X37X135 (Permanent Stent) ×2 IMPLANT
SYR MEDRAD MARK V 150ML (SYRINGE) ×2 IMPLANT
TAPE VIPERTRACK RADIOPAQ (MISCELLANEOUS) ×1 IMPLANT
TAPE VIPERTRACK RADIOPAQUE (MISCELLANEOUS) ×2
TRANSDUCER W/STOPCOCK (MISCELLANEOUS) ×2 IMPLANT
TRAY PV CATH (CUSTOM PROCEDURE TRAY) ×2 IMPLANT
WIRE AMPLATZ SS-J .035X180CM (WIRE) ×2 IMPLANT
WIRE BENTSON .035X145CM (WIRE) ×2 IMPLANT
WIRE ROSEN-J .035X260CM (WIRE) ×2 IMPLANT

## 2018-02-10 NOTE — Op Note (Signed)
Patient name: John Ball MRN: [id number] DOB: 09-19-1940 Sex: male  02/10/2018 Pre-operative Diagnosis: Left foot ulcer Post-operative diagnosis:  Same Surgeon:  Annamarie Major Procedure Performed:  1.  Ultrasound-guided access, right femoral artery  2.  Abdominal aortogram  3.  Left lower extremity runoff  4.  Failed angioplasty, left superficial femoral artery   5.  Left common iliac stent  6.   Conscious sedation (89 minutes)    Indications: Recovered from this procedure.  Unfortunately he has developed new wounds on his toes and severe pain.  He comes in today for angiography.  Conscious sedation was administered with the use of IV fentanyl and Versed under continuous physician and nurse monitoring.  Heart rate, blood pressure, and oxygen saturation were continuously monitored.  Patient has recently undergone left femoral endarterectomy with patch angioplasty.  He has  Procedure:  The patient was identified in the holding area and taken to room 8.  The patient was then placed supine on the table and prepped and draped in the usual sterile fashion.  A time out was called.  Ultrasound was used to evaluate the right common femoral artery.  It was patent .  A digital ultrasound image was acquired.  A micropuncture needle was used to access the right common femoral artery under ultrasound guidance.  An 018 wire was advanced without resistance and a micropuncture sheath was placed.  The 018 wire was removed and a benson wire was placed.  The micropuncture sheath was exchanged for a 5 french sheath.  An omniflush catheter was advanced over the wire to the level of L-1.  An abdominal angiogram was obtained.  Next, using the omniflush catheter and a benson wire, the aortic bifurcation was crossed and the catheter was placed into theleft external iliac artery and left runoff was obtai  Findings:   Aortogram: No significant renal artery stenosis.  The infrarenal abdominal aorta is widely patent.   Views throughout the right iliac system.  Stenosis is identified in the left common iliac artery approximately 70%.  The external iliac artery appears to be small in caliber and calcified but without hemodynamically significant lesion.  Right Lower Extremity: Not evaluated  Left Lower Extremity: Left common femoral artery has been repaired with endarterectomy and patch angioplasty.  This area is widely patent.  The profundofemoral artery is patent proximally.  There is a stenosis distally.  Superficial femoral artery is occluded in the adductor canal with reconstitution of the above-knee popliteal artery.  There may be a stenosis within the proximal posterior tibial artery with branches the dominant runoff across the ankle  Intervention: After the above images were acquired the decision made to proceed with intervention.  A 7 French 55 cm high flex Ansell 1 sheath was used to cross the aortic bifurcation.  I used a Rosen wire and a Amplatz superstiff wire but was unable to advance the sheath beyond the mid left external iliac artery secondary to tortuosity.  At this point, the patient was fully heparinized.  I used a 035 Glidewire and quick cross catheter to attempt subintimal recanalization.  The plaque was heavily calcified within the adductor canal.  I ended up exchanging out for a Glidewire advantage.  After multiple attempts at reentry, I was unsuccessful.  At this point the patient was getting very agitated and having trouble laying still likely reacting to the large amount of sedation he had gotten.  Therefore, I elected to not proceed with further attempts at reentry and  turned my attention towards the iliac arteries.  There was a 40 mm gradient across the common iliac lesion therefore elected to primarily stent this which was done with a balloon expandable XPRESS 9x37.  This was deployed and dilated.  Follow-up imaging showed resolution of the stenosis.  At this point wire was removed and the sheath was  withdrawn to the right external iliac artery.  The patient be taken the whole year for sheath pull once coagulation profile corrects  Impression:  #1 widely patent left femoral endarterectomy and patch angioplasty site  #2 occluded left superficial femoral artery, unable to be recanalized.  #3 high-grade left common iliac stenosis successfully treated with a 9 x 37 balloon expandable stent with no residual stenosis.  #4 dominant runoff to the left foot is via posterior tibial artery    V. Annamarie Major, M.D. [facility] Office: [phone] Pager:  [phone]

## 2018-02-10 NOTE — Progress Notes (Signed)
Pt received from cath lab. VSS. RG level 0.Telemetry applied. CHG complete. Educated on 4 hour bed rest. Dinner tray ordered. Pt and family oriented to room and unit. Call light in reach. Will continue to monitor.  [name], RN

## 2018-02-10 NOTE — Progress Notes (Signed)
C/o pain in left foot paged Dr Trula Slade rates pain as 8 on pain scale

## 2018-02-10 NOTE — Progress Notes (Signed)
Site area: rt groin fa sheath Site Prior to Removal:  Level  0 Pressure Applied For: 25 minutes Manual:   yes Patient Status During Pull:  stable Post Pull Site:  Level 0 Post Pull Instructions Given:  yes Post Pull Pulses Present: rt anterior tibial dopplered Dressing Applied:  Gauze and tegaderm Bedrest begins @ 1800 Comments:

## 2018-02-10 NOTE — Progress Notes (Addendum)
New orders noted.  States the itching happened many years ago. Pt states he would like to try Morphine and let me know if th itching happens,

## 2018-02-11 ENCOUNTER — Ambulatory Visit (HOSPITAL_COMMUNITY): Payer: Medicare Other | Admitting: Certified Registered"

## 2018-02-11 ENCOUNTER — Encounter (HOSPITAL_COMMUNITY): Payer: Self-pay | Admitting: Surgery

## 2018-02-11 ENCOUNTER — Other Ambulatory Visit: Payer: Self-pay

## 2018-02-11 ENCOUNTER — Encounter (HOSPITAL_COMMUNITY)
Admission: RE | Disposition: A | Discharge status: Home / Self Care | Payer: Self-pay | Source: Ambulatory Visit | Attending: Surgery

## 2018-02-11 DIAGNOSIS — Z86718 Personal history of other venous thrombosis and embolism: Secondary | ICD-10-CM | POA: Diagnosis not present

## 2018-02-11 DIAGNOSIS — I998 Other disorder of circulatory system: Secondary | ICD-10-CM | POA: Diagnosis not present

## 2018-02-11 DIAGNOSIS — L97529 Non-pressure chronic ulcer of other part of left foot with unspecified severity: Secondary | ICD-10-CM | POA: Diagnosis not present

## 2018-02-11 DIAGNOSIS — I5032 Chronic diastolic (congestive) heart failure: Secondary | ICD-10-CM | POA: Diagnosis not present

## 2018-02-11 DIAGNOSIS — I2510 Atherosclerotic heart disease of native coronary artery without angina pectoris: Secondary | ICD-10-CM | POA: Diagnosis not present

## 2018-02-11 DIAGNOSIS — I110 Hypertensive heart disease with heart failure: Secondary | ICD-10-CM | POA: Diagnosis not present

## 2018-02-11 DIAGNOSIS — H9193 Unspecified hearing loss, bilateral: Secondary | ICD-10-CM | POA: Diagnosis not present

## 2018-02-11 DIAGNOSIS — G629 Polyneuropathy, unspecified: Secondary | ICD-10-CM | POA: Diagnosis not present

## 2018-02-11 DIAGNOSIS — Z974 Presence of external hearing-aid: Secondary | ICD-10-CM | POA: Diagnosis not present

## 2018-02-11 DIAGNOSIS — I10 Essential (primary) hypertension: Secondary | ICD-10-CM | POA: Diagnosis not present

## 2018-02-11 DIAGNOSIS — I70262 Atherosclerosis of native arteries of extremities with gangrene, left leg: Secondary | ICD-10-CM | POA: Diagnosis not present

## 2018-02-11 DIAGNOSIS — G4733 Obstructive sleep apnea (adult) (pediatric): Secondary | ICD-10-CM | POA: Diagnosis not present

## 2018-02-11 DIAGNOSIS — J449 Chronic obstructive pulmonary disease, unspecified: Secondary | ICD-10-CM | POA: Diagnosis not present

## 2018-02-11 DIAGNOSIS — E785 Hyperlipidemia, unspecified: Secondary | ICD-10-CM | POA: Diagnosis not present

## 2018-02-11 DIAGNOSIS — M1990 Unspecified osteoarthritis, unspecified site: Secondary | ICD-10-CM | POA: Diagnosis not present

## 2018-02-11 HISTORY — PX: FEMORAL-POPLITEAL BYPASS GRAFT: SHX937

## 2018-02-11 LAB — MRSA PCR SCREENING: MRSA by PCR: NEGATIVE

## 2018-02-11 LAB — TYPE AND SCREEN
ABO/RH(D): A POS
ANTIBODY SCREEN: NEGATIVE

## 2018-02-11 SURGERY — BYPASS GRAFT FEMORAL-POPLITEAL ARTERY
Anesthesia: General | Site: Leg Upper | Laterality: Left

## 2018-02-11 MED ORDER — DEXAMETHASONE SODIUM PHOSPHATE 10 MG/ML IJ SOLN
INTRAMUSCULAR | Status: DC | PRN
  Administered 2018-02-11: 10 mg via INTRAVENOUS

## 2018-02-11 MED ORDER — HEPARIN SODIUM (PORCINE) 1000 UNIT/ML IJ SOLN
INTRAMUSCULAR | Status: DC | PRN
  Administered 2018-02-11: 9000 [IU] via INTRAVENOUS

## 2018-02-11 MED ORDER — LACTATED RINGERS IV SOLN
INTRAVENOUS | Status: DC
Start: 2018-02-11 — End: 2018-02-12
  Administered 2018-02-11 (×2): via INTRAVENOUS

## 2018-02-11 MED ORDER — PROPOFOL 10 MG/ML IV BOLUS
INTRAVENOUS | Status: AC
  Filled 2018-02-11: qty 20

## 2018-02-11 MED ORDER — LIDOCAINE 2% (20 MG/ML) 5 ML SYRINGE
INTRAMUSCULAR | Status: DC | PRN
  Administered 2018-02-11: 100 mg via INTRAVENOUS

## 2018-02-11 MED ORDER — FENTANYL CITRATE (PF) 100 MCG/2ML IJ SOLN: 25.0000 ug | INTRAMUSCULAR | Status: DC | PRN

## 2018-02-11 MED ORDER — PHENYLEPHRINE HCL 10 MG/ML IJ SOLN
INTRAMUSCULAR | Status: DC | PRN
  Administered 2018-02-11: 50 ug/min via INTRAVENOUS

## 2018-02-11 MED ORDER — NICOTINE 21 MG/24HR TD PT24
21.0000 mg | MEDICATED_PATCH | Freq: Every day | TRANSDERMAL | Status: DC
  Administered 2018-02-11: 21 mg via TRANSDERMAL
  Filled 2018-02-11: qty 1

## 2018-02-11 MED ORDER — FENTANYL CITRATE (PF) 250 MCG/5ML IJ SOLN
INTRAMUSCULAR | Status: DC | PRN
  Administered 2018-02-11 (×3): 50 ug via INTRAVENOUS

## 2018-02-11 MED ORDER — LORAZEPAM 1 MG PO TABS
2.0000 mg | ORAL_TABLET | Freq: Four times a day (QID) | ORAL | Status: DC | PRN
  Filled 2018-02-11: qty 2

## 2018-02-11 MED ORDER — ALBUMIN HUMAN 5 % IV SOLN
INTRAVENOUS | Status: DC | PRN
  Administered 2018-02-11: 18:00:00 via INTRAVENOUS

## 2018-02-11 MED ORDER — SODIUM CHLORIDE 0.9 % IV SOLN
INTRAVENOUS | Status: DC | PRN
  Administered 2018-02-11: 18:00:00

## 2018-02-11 MED ORDER — EPHEDRINE SULFATE-NACL 50-0.9 MG/10ML-% IV SOSY
PREFILLED_SYRINGE | INTRAVENOUS | Status: DC | PRN
  Administered 2018-02-11: 15 mg via INTRAVENOUS
  Administered 2018-02-11 (×2): 10 mg via INTRAVENOUS

## 2018-02-11 MED ORDER — CEFAZOLIN SODIUM-DEXTROSE 1-4 GM/50ML-% IV SOLN
INTRAVENOUS | Status: DC | PRN
  Administered 2018-02-11: 2 g via INTRAVENOUS

## 2018-02-11 MED ORDER — ROCURONIUM BROMIDE 10 MG/ML (PF) SYRINGE
PREFILLED_SYRINGE | INTRAVENOUS | Status: DC | PRN
  Administered 2018-02-11 (×2): 50 mg via INTRAVENOUS

## 2018-02-11 MED ORDER — SUGAMMADEX SODIUM 200 MG/2ML IV SOLN
INTRAVENOUS | Status: DC | PRN
  Administered 2018-02-11: 200 mg via INTRAVENOUS

## 2018-02-11 MED ORDER — PROTAMINE SULFATE 10 MG/ML IV SOLN
INTRAVENOUS | Status: DC | PRN
  Administered 2018-02-11: 10 mg via INTRAVENOUS
  Administered 2018-02-11: 15 mg via INTRAVENOUS
  Administered 2018-02-11: 10 mg via INTRAVENOUS
  Administered 2018-02-11: 15 mg via INTRAVENOUS

## 2018-02-11 MED ORDER — FENTANYL CITRATE (PF) 250 MCG/5ML IJ SOLN
INTRAMUSCULAR | Status: AC
  Filled 2018-02-11: qty 5

## 2018-02-11 MED ORDER — LIDOCAINE 2% (20 MG/ML) 5 ML SYRINGE
INTRAMUSCULAR | Status: AC
  Filled 2018-02-11: qty 5

## 2018-02-11 MED ORDER — ROCURONIUM BROMIDE 10 MG/ML (PF) SYRINGE
PREFILLED_SYRINGE | INTRAVENOUS | Status: AC
  Filled 2018-02-11: qty 10

## 2018-02-11 MED ORDER — SODIUM CHLORIDE 0.9 % IV SOLN
INTRAVENOUS | Status: AC
  Filled 2018-02-11: qty 1.2

## 2018-02-11 MED ORDER — PHENYLEPHRINE 40 MCG/ML (10ML) SYRINGE FOR IV PUSH (FOR BLOOD PRESSURE SUPPORT)
PREFILLED_SYRINGE | INTRAVENOUS | Status: DC | PRN
  Administered 2018-02-11: 80 ug via INTRAVENOUS
  Administered 2018-02-11: 120 ug via INTRAVENOUS
  Administered 2018-02-11 (×2): 200 ug via INTRAVENOUS

## 2018-02-11 MED ORDER — DEXAMETHASONE SODIUM PHOSPHATE 10 MG/ML IJ SOLN
INTRAMUSCULAR | Status: AC
  Filled 2018-02-11: qty 1

## 2018-02-11 MED ORDER — ONDANSETRON HCL 4 MG/2ML IJ SOLN
INTRAMUSCULAR | Status: AC
  Filled 2018-02-11: qty 2

## 2018-02-11 MED ORDER — PROPOFOL 10 MG/ML IV BOLUS
INTRAVENOUS | Status: DC | PRN
  Administered 2018-02-11: 120 mg via INTRAVENOUS

## 2018-02-11 MED ORDER — ONDANSETRON HCL 4 MG/2ML IJ SOLN
INTRAMUSCULAR | Status: DC | PRN
  Administered 2018-02-11: 4 mg via INTRAVENOUS

## 2018-02-11 MED ORDER — 0.9 % SODIUM CHLORIDE (POUR BTL) OPTIME
TOPICAL | Status: DC | PRN
  Administered 2018-02-11 (×2): 1000 mL

## 2018-02-11 SURGICAL SUPPLY — 55 items
ADH SKN CLS APL DERMABOND .7 (GAUZE/BANDAGES/DRESSINGS) ×1
BAG ISL DRAPE 18X18 STRL (DRAPES) ×1
BAG ISOLATION DRAPE 18X18 (DRAPES) ×1 IMPLANT
BANDAGE ESMARK 6X9 LF (GAUZE/BANDAGES/DRESSINGS) ×1 IMPLANT
BNDG CMPR 9X6 STRL LF SNTH (GAUZE/BANDAGES/DRESSINGS) ×1
BNDG ESMARK 6X9 LF (GAUZE/BANDAGES/DRESSINGS) ×3
CANISTER SUCT 3000ML PPV (MISCELLANEOUS) ×3 IMPLANT
CANNULA VESSEL 3MM 2 BLNT TIP (CANNULA) ×6 IMPLANT
CLIP LIGATING EXTRA MED SLVR (CLIP) ×3 IMPLANT
CLIP LIGATING EXTRA SM BLUE (MISCELLANEOUS) ×3 IMPLANT
CUFF TOURNIQUET SINGLE 34IN LL (TOURNIQUET CUFF) ×3 IMPLANT
CUFF TOURNIQUET SINGLE 44IN (TOURNIQUET CUFF) IMPLANT
DERMABOND ADVANCED (GAUZE/BANDAGES/DRESSINGS) ×2
DERMABOND ADVANCED .7 DNX12 (GAUZE/BANDAGES/DRESSINGS) ×1 IMPLANT
DRAIN SNY 10X20 3/4 PERF (WOUND CARE) IMPLANT
DRAPE HALF SHEET 40X57 (DRAPES) IMPLANT
DRAPE ISOLATION BAG 18X18 (DRAPES) ×2
DRAPE X-RAY CASS 24X20 (DRAPES) IMPLANT
DRSG COVADERM 4X6 (GAUZE/BANDAGES/DRESSINGS) ×3 IMPLANT
ELECT REM PT RETURN 9FT ADLT (ELECTROSURGICAL) ×3
ELECTRODE REM PT RTRN 9FT ADLT (ELECTROSURGICAL) ×1 IMPLANT
EVACUATOR SILICONE 100CC (DRAIN) IMPLANT
GEL ULTRASOUND 20GR AQUASONIC (MISCELLANEOUS) ×3 IMPLANT
GLOVE BIO SURGEON STRL SZ7 (GLOVE) ×3 IMPLANT
GLOVE INDICATOR 7.0 STRL GRN (GLOVE) ×3 IMPLANT
GLOVE SS BIOGEL STRL SZ 7.5 (GLOVE) ×1 IMPLANT
GLOVE SUPERSENSE BIOGEL SZ 7.5 (GLOVE) ×2
GLOVE SURG SS PI 6.5 STRL IVOR (GLOVE) ×6 IMPLANT
GOWN STRL REUS W/ TWL LRG LVL3 (GOWN DISPOSABLE) ×3 IMPLANT
GOWN STRL REUS W/TWL LRG LVL3 (GOWN DISPOSABLE) ×9
GRAFT PROPATEN W/RING 6X80X60 (Vascular Products) ×3 IMPLANT
INSERT FOGARTY SM (MISCELLANEOUS) IMPLANT
KIT BASIN OR (CUSTOM PROCEDURE TRAY) ×3 IMPLANT
KIT TURNOVER KIT B (KITS) ×3 IMPLANT
NS IRRIG 1000ML POUR BTL (IV SOLUTION) ×6 IMPLANT
PACK PERIPHERAL VASCULAR (CUSTOM PROCEDURE TRAY) ×3 IMPLANT
PAD ARMBOARD 7.5X6 YLW CONV (MISCELLANEOUS) ×6 IMPLANT
PADDING CAST COTTON 6X4 STRL (CAST SUPPLIES) ×3 IMPLANT
SET COLLECT BLD 21X3/4 12 (NEEDLE) IMPLANT
STAPLER VISISTAT 35W (STAPLE) ×3 IMPLANT
STOPCOCK 4 WAY LG BORE MALE ST (IV SETS) IMPLANT
SUT ETHILON 3 0 PS 1 (SUTURE) IMPLANT
SUT PROLENE 5 0 C 1 24 (SUTURE) ×3 IMPLANT
SUT PROLENE 6 0 CC (SUTURE) ×6 IMPLANT
SUT SILK 2 0 SH (SUTURE) ×3 IMPLANT
SUT VIC AB 2-0 CT1 27 (SUTURE) ×3
SUT VIC AB 2-0 CT1 TAPERPNT 27 (SUTURE) ×1 IMPLANT
SUT VIC AB 2-0 CTX 36 (SUTURE) ×6 IMPLANT
SUT VIC AB 3-0 SH 27 (SUTURE) ×9
SUT VIC AB 3-0 SH 27X BRD (SUTURE) ×3 IMPLANT
TOWEL GREEN STERILE (TOWEL DISPOSABLE) ×3 IMPLANT
TRAY FOLEY MTR SLVR 16FR STAT (SET/KITS/TRAYS/PACK) ×3 IMPLANT
TUBING EXTENTION W/L.L. (IV SETS) IMPLANT
UNDERPAD 30X30 (UNDERPADS AND DIAPERS) ×6 IMPLANT
WATER STERILE IRR 1000ML POUR (IV SOLUTION) ×3 IMPLANT

## 2018-02-11 NOTE — Anesthesia Procedure Notes (Signed)
Procedure Name: Intubation Date/Time: 02/11/2018 5:47 PM Performed by: Teressa Lower., CRNA Pre-anesthesia Checklist: Patient identified, Emergency Drugs available, Suction available and Patient being monitored Patient Re-evaluated:Patient Re-evaluated prior to induction Oxygen Delivery Method: Circle system utilized Preoxygenation: Pre-oxygenation with 100% oxygen Induction Type: IV induction Ventilation: Mask ventilation without difficulty Laryngoscope Size: Mac and 3 Grade View: Grade II Tube type: Oral Tube size: 7.5 mm Number of attempts: 1 Airway Equipment and Method: Stylet and Oral airway Placement Confirmation: ETT inserted through vocal cords under direct vision,  positive ETCO2 and breath sounds checked- equal and bilateral Secured at: 23 cm Tube secured with: Tape Dental Injury: Teeth and Oropharynx as per pre-operative assessment

## 2018-02-11 NOTE — Discharge Instructions (Signed)
 [facility]  Discharge instructions  Lower Extremity Bypass Surgery  Please refer to the following instruction for your post-procedure care. Your surgeon or physician assistant will discuss any changes with you.  Activity  You are encouraged to walk as much as you can. You can slowly return to normal activities during the month after your surgery. Avoid strenuous activity and heavy lifting until your doctor tells you it's OK. Avoid activities such as vacuuming or swinging a golf club. Do not drive until your doctor give the OK and you are no longer taking prescription pain medications. It is also normal to have difficulty with sleep habits, eating and bowel movement after surgery. These will go away with time.  Bathing/Showering  Shower daily after you go home. Do not soak in a bathtub, hot tub, or swim until the incision heals completely.  Incision Care  Clean your incision with mild soap and water. Shower every day. Pat the area dry with a clean towel. You do not need a bandage unless otherwise instructed. Do not apply any ointments or creams to your incision. If you have open wounds you will be instructed how to care for them or a visiting nurse may be arranged for you. If you have staples or sutures along your incision they will be removed at your post-op appointment. You may have skin glue on your incision. Do not peel it off. It will come off on its own in about one week.  Wash the groin wound with soap and water daily and pat dry. (No tub bath-only shower)  Then put a dry gauze or washcloth in the groin to keep this area dry to help prevent wound infection.  Do this daily and as needed.  Do not use Vaseline or neosporin on your incisions.  Only use soap and water on your incisions and then protect and keep dry.  Diet  Resume your normal diet. There are no special food restrictions following this procedure. A low fat/ low cholesterol diet is  recommended for all patients with vascular disease. In order to heal from your surgery, it is CRITICAL to get adequate nutrition. Your body requires vitamins, minerals, and protein. Vegetables are the best source of vitamins and minerals. Vegetables also provide the perfect balance of protein. Processed food has little nutritional value, so try to avoid this.  Medications  Resume taking all your medications unless your doctor or physician assistant tells you not to. If your incision is causing pain, you may take over-the-counter pain relievers such as acetaminophen (Tylenol). If you were prescribed a stronger pain medication, please aware these medication can cause nausea and constipation. Prevent nausea by taking the medication with a snack or meal. Avoid constipation by drinking plenty of fluids and eating foods with high amount of fiber, such as fruits, vegetables, and grains. Take Colace 100 mg (an over-the-counter stool softener) twice a day as needed for constipation.  Do not take Tylenol if you are taking prescription pain medications.  Follow Up  Our office will schedule a follow up appointment 2-3 weeks following discharge.  Please call us immediately for any of the following conditions  Severe or worsening pain in your legs or feet while at rest or while walking Increase pain, redness, warmth, or drainage (pus) from your incision site(s) Fever of 101 degree or higher The swelling in your leg with the bypass suddenly worsens and becomes more painful than when you were in the hospital If you have   been instructed to feel your graft pulse then you should do so every day. If you can no longer feel this pulse, call the office immediately. Not all patients are given this instruction.  Leg swelling is common after leg bypass surgery.  The swelling should improve over a few months following surgery. To improve the swelling, you may elevate your legs above the level of your heart while you are  sitting or resting. Your surgeon or physician assistant may ask you to apply an ACE wrap or wear compression (TED) stockings to help to reduce swelling.  Reduce your risk of vascular disease  Stop smoking. If you would like help call [facility] at 1-800-QUIT-NOW (1-800-784-8669) or [place] at 336-586-4000.  Manage your cholesterol Maintain a desired weight Control your diabetes weight Control your diabetes Keep your blood pressure down  If you have any questions, please call the office at [phone]  

## 2018-02-11 NOTE — Progress Notes (Signed)
Progress Note    02/11/2018 8:42 AM 1 Day Post-Op  Subjective:  Pt very upset this morning that he is still in the hospital.  Dr. Donnetta Hutching stated he can do his surgery this afternoon but the pt is adamant about going home.    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   CBC    Component Value Date/Time   WBC 7.3 12/03/2017 0417   RBC 3.72 (L) 12/03/2017 0417   HGB 13.6 02/10/2018 1129   HGB 14.6 09/25/2017 1420   HGB 14.4 05/02/2014 1156   HCT 40.0 02/10/2018 1129   HCT 43.0 09/25/2017 1420   HCT 43.9 05/02/2014 1156   PLT 209 12/03/2017 0417   PLT 228 09/25/2017 1420   MCV 101.6 (H) 12/03/2017 0417   MCV 98 (H) 09/25/2017 1420   MCV 96.1 05/02/2014 1156   MCH 32.8 12/03/2017 0417   MCHC 32.3 12/03/2017 0417   RDW 13.0 12/03/2017 0417   RDW 14.6 09/25/2017 1420   RDW 14.1 05/02/2014 1156   LYMPHSABS 1.7 11/30/2017 0902   LYMPHSABS 1.6 05/02/2014 1156   MONOABS 0.3 11/30/2017 0902   MONOABS 0.4 05/02/2014 1156   EOSABS 0.1 11/30/2017 0902   EOSABS 0.2 05/02/2014 1156   BASOSABS 0.0 11/30/2017 0902   BASOSABS 0.0 05/02/2014 1156    BMET    Component Value Date/Time   NA 139 02/10/2018 1129   NA 143 09/25/2017 1420   NA 139 05/02/2014 1156   K 4.3 02/10/2018 1129   K 4.1 05/02/2014 1156   CL 103 02/10/2018 1129   CO2 32 12/03/2017 0417   CO2 24 05/02/2014 1156   GLUCOSE 88 02/10/2018 1129   GLUCOSE 101 05/02/2014 1156   BUN 20 02/10/2018 1129   BUN 18 09/25/2017 1420   BUN 15.7 05/02/2014 1156   CREATININE 1.00 02/10/2018 1129   CREATININE 1.21 07/07/2014 1105   CREATININE 1.1 05/02/2014 1156   CALCIUM 9.4 12/03/2017 0417   CALCIUM 9.5 05/02/2014 1156   GFRNONAA >60 12/03/2017 0417   GFRAA >60 12/03/2017 0417    INR    Component Value Date/Time   INR 1.05 11/12/2017 1344     Intake/Output Summary (Last 24 hours) at 02/11/2018 0842 Last data filed at 02/11/2018 0245 Gross per 24 hour  Intake 1184 ml  Output 400 ml  Net 784 ml     Assessment:  77 y.o. male is s/p:  Procedure Performed:             1.  Ultrasound-guided access, right femoral artery             2.  Abdominal aortogram             3.  Left lower extremity runoff             4.  Failed angioplasty, left superficial femoral artery              5.  Left common iliac stent             6.   Conscious sedation (89 minutes)  1 Day Post-Op  Plan: -pt with gangrenous  changes to his toes on the right foot.  Dr. Trula Slade spoke with pt this am and Dr. Donnetta Hutching could do his bypass this afternoon.  Pt adamant about going home.  I discussed with him he is at risk of amputation and he agreed until I told him it would be this afternoon and now wants to go home and is adamant.   -will discharge pt and set him up for surgery in the near future.     Leontine Locket, PA-C [facility] [phone] 02/11/2018 8:42 AM

## 2018-02-11 NOTE — Progress Notes (Signed)
Pateint has agreed to surgical procedure today and last had food/beverage at 0830. He agrees to NPO from this point on.

## 2018-02-11 NOTE — Anesthesia Procedure Notes (Signed)
Arterial Line Insertion Start/End8/01/2018 4:30 PM, 02/11/2018 4:33 PM Performed by: Teressa Lower., CRNA, CRNA  Patient location: Pre-op. Preanesthetic checklist: patient identified, IV checked, site marked, risks and benefits discussed, surgical consent, monitors and equipment checked, pre-op evaluation, timeout performed and anesthesia consent Lidocaine 1% used for infiltration Right, radial was placed Catheter size: 20 G Hand hygiene performed , maximum sterile barriers used  and Seldinger technique used Allen's test indicative of satisfactory collateral circulation Attempts: 1 Procedure performed without using ultrasound guided technique. Following insertion, dressing applied and Biopatch. Post procedure assessment: normal and unchanged  Patient tolerated the procedure well with no immediate complications.

## 2018-02-11 NOTE — Anesthesia Postprocedure Evaluation (Signed)
Anesthesia Post Note  Patient: John Ball  Procedure(s) Performed: BYPASS GRAFT LOWER EXTREMITY (Left Leg Upper)     Patient location during evaluation: PACU Anesthesia Type: General Level of consciousness: awake and alert Pain management: pain level controlled Vital Signs Assessment: post-procedure vital signs reviewed and stable Respiratory status: spontaneous breathing, nonlabored ventilation, respiratory function stable and patient connected to nasal cannula oxygen Cardiovascular status: blood pressure returned to baseline and stable Postop Assessment: no apparent nausea or vomiting Anesthetic complications: no    Last Vitals:  Vitals:   02/11/18 2015 02/11/18 2027  BP: 124/70 126/66  Pulse: 88 87  Resp: (!) 24 17  Temp:  36.4 C  SpO2: 92% 91%    Last Pain:  Vitals:   02/11/18 2035  TempSrc:   PainSc: 10-Worst pain ever                 Tiajuana Amass

## 2018-02-11 NOTE — H&P (View-Only) (Signed)
Progress Note    02/11/2018 8:42 AM 1 Day Post-Op  Subjective:  Pt very upset this morning that he is still in the hospital.  Dr. Donnetta Hutching stated he can do his surgery this afternoon but the pt is adamant about going home.    Afebrile HR 70's-90's NSR 700'F-749'S systolic 49% [id number]  Vitals:   02/11/18 0416 02/11/18 0840  BP: 122/69   Pulse: 83   Resp: 15   Temp: 98.1 F (36.7 C)   SpO2: 94% 93%    Physical Exam: Cardiac:  regular Lungs:  Non labored Incisions:  Right groin is soft  Extremities:  + doppler signal left PT; dry gangrene to toes 2-5   CBC    Component Value Date/Time   WBC 7.3 12/03/2017 0417   RBC 3.72 (L) 12/03/2017 0417   HGB 13.6 02/10/2018 1129   HGB 14.6 09/25/2017 1420   HGB 14.4 05/02/2014 1156   HCT 40.0 02/10/2018 1129   HCT 43.0 09/25/2017 1420   HCT 43.9 05/02/2014 1156   PLT 209 12/03/2017 0417   PLT 228 09/25/2017 1420   MCV 101.6 (H) 12/03/2017 0417   MCV 98 (H) 09/25/2017 1420   MCV 96.1 05/02/2014 1156   MCH 32.8 12/03/2017 0417   MCHC 32.3 12/03/2017 0417   RDW 13.0 12/03/2017 0417   RDW 14.6 09/25/2017 1420   RDW 14.1 05/02/2014 1156   LYMPHSABS 1.7 11/30/2017 0902   LYMPHSABS 1.6 05/02/2014 1156   MONOABS 0.3 11/30/2017 0902   MONOABS 0.4 05/02/2014 1156   EOSABS 0.1 11/30/2017 0902   EOSABS 0.2 05/02/2014 1156   BASOSABS 0.0 11/30/2017 0902   BASOSABS 0.0 05/02/2014 1156    BMET    Component Value Date/Time   NA 139 02/10/2018 1129   NA 143 09/25/2017 1420   NA 139 05/02/2014 1156   K 4.3 02/10/2018 1129   K 4.1 05/02/2014 1156   CL 103 02/10/2018 1129   CO2 32 12/03/2017 0417   CO2 24 05/02/2014 1156   GLUCOSE 88 02/10/2018 1129   GLUCOSE 101 05/02/2014 1156   BUN 20 02/10/2018 1129   BUN 18 09/25/2017 1420   BUN 15.7 05/02/2014 1156   CREATININE 1.00 02/10/2018 1129   CREATININE 1.21 07/07/2014 1105   CREATININE 1.1 05/02/2014 1156   CALCIUM 9.4 12/03/2017 0417   CALCIUM 9.5 05/02/2014 1156   GFRNONAA >60 12/03/2017 0417   GFRAA >60 12/03/2017 0417    INR    Component Value Date/Time   INR 1.05 11/12/2017 1344     Intake/Output Summary (Last 24 hours) at 02/11/2018 0842 Last data filed at 02/11/2018 0245 Gross per 24 hour  Intake 1184 ml  Output 400 ml  Net 784 ml     Assessment:  77 y.o. male is s/p:  Procedure Performed:             1.  Ultrasound-guided access, right femoral artery             2.  Abdominal aortogram             3.  Left lower extremity runoff             4.  Failed angioplasty, left superficial femoral artery              5.  Left common iliac stent             6.   Conscious sedation (89 minutes)  1 Day Post-Op  Plan: -pt with gangrenous  changes to his toes on the right foot.  Dr. Trula Slade spoke with pt this am and Dr. Donnetta Hutching could do his bypass this afternoon.  Pt adamant about going home.  I discussed with him he is at risk of amputation and he agreed until I told him it would be this afternoon and now wants to go home and is adamant.   -will discharge pt and set him up for surgery in the near future.     Leontine Locket, PA-C [facility] [phone] 02/11/2018 8:42 AM

## 2018-02-11 NOTE — Anesthesia Preprocedure Evaluation (Addendum)
Anesthesia Evaluation  Patient identified by MRN, date of birth, ID band Patient awake    Reviewed: Allergy & Precautions, NPO status , Patient's Chart, lab work & pertinent test results  Airway Mallampati: II  TM Distance: >3 FB     Dental   Pulmonary sleep apnea , pneumonia, COPD, Current Smoker,    breath sounds clear to auscultation       Cardiovascular hypertension, + angina + CAD and + Peripheral Vascular Disease   Rhythm:Regular Rate:Normal     Neuro/Psych    GI/Hepatic negative GI ROS, Neg liver ROS,   Endo/Other  negative endocrine ROS  Renal/GU negative Renal ROS     Musculoskeletal  (+) Arthritis ,   Abdominal   Peds  Hematology   Anesthesia Other Findings   Reproductive/Obstetrics                             Anesthesia Physical Anesthesia Plan  ASA: III  Anesthesia Plan: General   Post-op Pain Management:    Induction: Intravenous  PONV Risk Score and Plan: 1 and Treatment may vary due to age or medical condition, Ondansetron and Midazolam  Airway Management Planned:   Additional Equipment:   Intra-op Plan:   Post-operative Plan: Possible Post-op intubation/ventilation  Informed Consent: I have reviewed the patients History and Physical, chart, labs and discussed the procedure including the risks, benefits and alternatives for the proposed anesthesia with the patient or authorized representative who has indicated his/her understanding and acceptance.   Dental advisory given  Plan Discussed with: CRNA and Surgeon  Anesthesia Plan Comments:         Anesthesia Quick Evaluation

## 2018-02-11 NOTE — Op Note (Signed)
    OPERATIVE REPORT  DATE OF SURGERY: 02/11/2018  PATIENT: John Ball, 77 y.o. male MRN: [id number]  DOB: 1941/01/30  PRE-OPERATIVE DIAGNOSIS: Critical limb ischemia left leg  POST-OPERATIVE DIAGNOSIS:  Same  PROCEDURE: Left femoral to below-knee popliteal bypass with a 6 mm propatent ringed Gore-Tex graft  SURGEON:  Curt Jews, M.D.  PHYSICIAN ASSISTANT: Liana Crocker, PA-C  ANESTHESIA: General  EBL: per anesthesia record  No intake/output data recorded.  BLOOD ADMINISTERED: none  DRAINS: none  SPECIMEN: none  COUNTS CORRECT:  YES  PATIENT DISPOSITION:  PACU - hemodynamically stable  PROCEDURE DETAILS: Patient was taken to the operative placed in supine position where the area the left groin left leg prepped draped you sterile fashion.  An incision was made over the prior groin cart scar and carried down to isolate the common femoral artery.  The patient had a femoral endarterectomy and patch approximately 2 months earlier.  The common femoral artery was mobilized circumferentially.  Next a separate incision was made over the medial approach to the below-knee popliteal artery.  The gastrocnemius muscle was reflected posteriorly.  The artery was heavily calcified but did have a patent lumen.  A tunnel was created from the level of the below-knee popliteal artery to the groin.  The patient was given 9000 units of intravenous heparin and after adequate circulation time the common femoral artery was occluded proximally distally and was opened with an 11 blade and sent lost 20 with Potts scissors.  The 6 mm propatent ringed Gore-Tex graft was brought onto the field and was sewn end-to-side to the common femoral artery with a running 6-0 Prolene suture.  This anastomosis was tested and found to be adequate.  The graft.  Flushed with heparinized saline and reoccluded.  The graft was then brought through the prior created tunnel down to the level of the below-knee popliteal artery.  A  pneumatic tourniquet was placed in the above-knee position.  The leg was elevated and extended with Esmarch tourniquet and the pneumatic tourniquet was inflated to 250 mmHg.  The below-knee popliteal artery was opened with an 11 blade and sent lost 20 with Potts scissors.  The Gore-Tex graft was cut to the appropriate length was spatulated and sewn end-to-side to the artery with a running 6-0 Prolene suture.  Prior to completion of the closure the tourniquet was deflated in the usual flushing maneuvers were undertaken.  The anastomosis completed and flow was restored to the foot.  The patient had excellent posterior tibial Doppler flow that was graft dependent.  The patient was given 50 mg of protamine to reverse heparin.  Wounds irrigated with saline.  Hemostasis talus cautery.  Wounds were closed with 2-0 Vicryl in the fascia.  The groin incision was closed with staples since he had had stitch abscess at his prior surgery.  The popliteal incision was closed with a 3 oh sub-particular Vicryl suture.  Patient was transferred to the recovery room in stable condition   Rosetta Posner, M.D., [facility] 02/11/2018 7:10 PM

## 2018-02-11 NOTE — Interval H&P Note (Signed)
History and Physical Interval Note:  02/11/2018 4:03 PM  John Ball  has presented today for surgery, with the diagnosis of peripheral artery disease  The various methods of treatment have been discussed with the patient and family. After consideration of risks, benefits and other options for treatment, the patient has consented to  Procedure(s): BYPASS GRAFT LOWER EXTREMITY (Left) as a surgical intervention .  The patient's history has been reviewed, patient examined, no change in status, stable for surgery.  I have reviewed the patient's chart and labs.  Questions were answered to the patient's satisfaction.     Curt Jews

## 2018-02-11 NOTE — Transfer of Care (Signed)
Immediate Anesthesia Transfer of Care Note  Patient: John Ball  Procedure(s) Performed: BYPASS GRAFT LOWER EXTREMITY (Left Leg Upper)  Patient Location: PACU  Anesthesia Type:General  Level of Consciousness: awake, alert  and oriented  Airway & Oxygen Therapy: Patient Spontanous Breathing and Patient connected to face mask oxygen  Post-op Assessment: Report given to RN and Post -op Vital signs reviewed and stable  Post vital signs: Reviewed and stable  Last Vitals:  Vitals Value Taken Time  BP 126/64 02/11/2018  7:25 PM  Temp    Pulse 91 02/11/2018  7:31 PM  Resp 21 02/11/2018  7:31 PM  SpO2 94 % 02/11/2018  7:31 PM  Vitals shown include unvalidated device data.  Last Pain:  Vitals:   02/11/18 1408  TempSrc: Oral  PainSc:       Patients Stated Pain Goal: 2 (25/67/20 9198)  Complications: No apparent anesthesia complications

## 2018-02-12 ENCOUNTER — Encounter (HOSPITAL_COMMUNITY): Payer: Self-pay | Admitting: Vascular Surgery

## 2018-02-12 DIAGNOSIS — G629 Polyneuropathy, unspecified: Secondary | ICD-10-CM | POA: Diagnosis present

## 2018-02-12 DIAGNOSIS — J449 Chronic obstructive pulmonary disease, unspecified: Secondary | ICD-10-CM | POA: Diagnosis present

## 2018-02-12 DIAGNOSIS — Z951 Presence of aortocoronary bypass graft: Secondary | ICD-10-CM | POA: Diagnosis not present

## 2018-02-12 DIAGNOSIS — G4733 Obstructive sleep apnea (adult) (pediatric): Secondary | ICD-10-CM | POA: Diagnosis present

## 2018-02-12 DIAGNOSIS — Z823 Family history of stroke: Secondary | ICD-10-CM | POA: Diagnosis not present

## 2018-02-12 DIAGNOSIS — Z8551 Personal history of malignant neoplasm of bladder: Secondary | ICD-10-CM | POA: Diagnosis not present

## 2018-02-12 DIAGNOSIS — I2510 Atherosclerotic heart disease of native coronary artery without angina pectoris: Secondary | ICD-10-CM | POA: Diagnosis present

## 2018-02-12 DIAGNOSIS — M1990 Unspecified osteoarthritis, unspecified site: Secondary | ICD-10-CM | POA: Diagnosis present

## 2018-02-12 DIAGNOSIS — I96 Gangrene, not elsewhere classified: Secondary | ICD-10-CM | POA: Diagnosis not present

## 2018-02-12 DIAGNOSIS — I70262 Atherosclerosis of native arteries of extremities with gangrene, left leg: Secondary | ICD-10-CM | POA: Diagnosis present

## 2018-02-12 DIAGNOSIS — H9193 Unspecified hearing loss, bilateral: Secondary | ICD-10-CM | POA: Diagnosis present

## 2018-02-12 DIAGNOSIS — Z820 Family history of epilepsy and other diseases of the nervous system: Secondary | ICD-10-CM | POA: Diagnosis not present

## 2018-02-12 DIAGNOSIS — E785 Hyperlipidemia, unspecified: Secondary | ICD-10-CM | POA: Diagnosis present

## 2018-02-12 DIAGNOSIS — L97529 Non-pressure chronic ulcer of other part of left foot with unspecified severity: Secondary | ICD-10-CM | POA: Diagnosis present

## 2018-02-12 DIAGNOSIS — Z86718 Personal history of other venous thrombosis and embolism: Secondary | ICD-10-CM | POA: Diagnosis not present

## 2018-02-12 DIAGNOSIS — F17210 Nicotine dependence, cigarettes, uncomplicated: Secondary | ICD-10-CM | POA: Diagnosis present

## 2018-02-12 DIAGNOSIS — Z886 Allergy status to analgesic agent status: Secondary | ICD-10-CM | POA: Diagnosis not present

## 2018-02-12 DIAGNOSIS — I10 Essential (primary) hypertension: Secondary | ICD-10-CM | POA: Diagnosis present

## 2018-02-12 DIAGNOSIS — Z9981 Dependence on supplemental oxygen: Secondary | ICD-10-CM | POA: Diagnosis not present

## 2018-02-12 DIAGNOSIS — Z974 Presence of external hearing-aid: Secondary | ICD-10-CM | POA: Diagnosis not present

## 2018-02-12 DIAGNOSIS — Z85118 Personal history of other malignant neoplasm of bronchus and lung: Secondary | ICD-10-CM | POA: Diagnosis not present

## 2018-02-12 DIAGNOSIS — F4310 Post-traumatic stress disorder, unspecified: Secondary | ICD-10-CM | POA: Diagnosis present

## 2018-02-12 LAB — CBC
HCT: 35.3 % — ABNORMAL LOW (ref 39.0–52.0)
Hemoglobin: 11 g/dL — ABNORMAL LOW (ref 13.0–17.0)
MCH: 32.4 pg (ref 26.0–34.0)
MCHC: 31.2 g/dL (ref 30.0–36.0)
MCV: 103.8 fL — AB (ref 78.0–100.0)
PLATELETS: 129 10*3/uL — AB (ref 150–400)
RBC: 3.4 MIL/uL — AB (ref 4.22–5.81)
RDW: 12.7 % (ref 11.5–15.5)
WBC: 5.8 10*3/uL (ref 4.0–10.5)

## 2018-02-12 MED ORDER — OXYCODONE HCL 5 MG PO TABS: 5.0000 mg | ORAL_TABLET | Freq: Four times a day (QID) | ORAL | 0 refills | Status: DC | PRN

## 2018-02-12 MED ORDER — CLOPIDOGREL BISULFATE 75 MG PO TABS: 75.0000 mg | ORAL_TABLET | Freq: Every day | ORAL | 0 refills | Status: DC

## 2018-02-12 NOTE — Progress Notes (Signed)
Discussed with the patient and all questioned fully answered. He will call me if any problems arise.  Paper work printed, copy given in discharge envelope to patient and wife with printed oxycodone prescription.  Pt ambulated without difficulty this am with Megan NT.  Of note, signature page did not print so 2nd avs was printed for pt wife to sign.  IV removed. Telemetry removed.   Fritz Pickerel, RN

## 2018-02-12 NOTE — Progress Notes (Signed)
Comfortable this morning.  Asking to be discharged. Groin incision with dressing intact.  Biphasic posterior tibial signal.  Below-knee popliteal incision healing. H&H stable at 11 and 35  Will mobilize this morning.  Okay for discharge today if comfortable walking and postoperative pain controlled with oral meds.  Will need staple removal from his groin in 2 weeks

## 2018-02-12 NOTE — Discharge Summary (Addendum)
Discharge Summary     John Ball 16-Jan-1941 77 y.o. male  [id number]  Admission Date: 02/10/2018  Discharge Date: 02/12/18  Physician: Rosetta Posner, MD  Admission Diagnosis: PAD (peripheral artery disease) ([place]) [I73.9]   HPI:   This is a 77 y.o. male who presents for evaluation of increased left foot pain, edema and erythema.  2-3 weeks ago he caught his foot under the screen door and cause scraps on the dorsum of his toes on the left foot.   He states the pain is preventing him from sleeping at night and walking as much as he usually does.                He had undergone a left femoral endarterectomy and Dacron angioplasty in early May. He presented with drainage and opening of his left groin several weeks postop and went back for debridement and VAC placement on 12/03/2017. He has had excellent healing of this and is been taking hydrogel. This has healed.               He was last seen on 01/06/2018 by Dr. Donnetta Hutching deemed stable condition.  His arterial blood flow was stable with a very good Doppler flow in his left posterior tibial artery.               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 Superficial femoral artery is patent proximally occludes in the mid thigh reconstitutes a heavily calcified above-knee popliteal. Below the knee popliteal does appear to be free of calcification but the tibioperoneal trunk is possibly disease. The anterior tibial artery is occluded. The dominant runoff to the foot is via the posterior tibial artery which appears free of disease.  Hospital Course:  The patient was admitted to the hospital and taken to the [facility] lab and underwent:  Procedure Performed:             1.  Ultrasound-guided access, right femoral artery             2.  Abdominal aortogram             3.  Left lower extremity runoff         4.  Failed angioplasty, left superficial femoral artery              5.  Left common iliac stent             6.   Conscious sedation (89 minutes)    Findings:              Aortogram: No significant renal artery stenosis.  The infrarenal abdominal aorta is widely patent.  Views throughout the right iliac system.  Stenosis is identified in the left common iliac artery approximately 70%.  The external iliac artery appears to be small in caliber and calcified but without hemodynamically significant lesion.             Right Lower Extremity: Not evaluated             Left Lower Extremity: Left common femoral artery has been repaired with endarterectomy and patch angioplasty.  This area is widely patent.  The profundofemoral artery is patent proximally.  There is a stenosis distally.  Superficial femoral artery is occluded in the adductor canal with reconstitution of the above-knee popliteal artery.  There may  be a stenosis within the proximal posterior tibial artery with branches the dominant runoff across the ankle.  The pt tolerated the procedure well and was transported to the PACU in good condition.   By POD 1, he was very upset that he was in the hospital.  Dr. Donnetta Hutching discussed with the pt that his bypass grafting could be done and pt ultimately decided to proceed.   On 02/12/18, he was taken to the operating room on 02/11/2018 and underwent: Left femoral to below-knee popliteal bypass with a 6 mm propatent ringed Gore-Tex graft.  By POD 1, he was doing well with groin incision in tact and biphasic PT doppler signal.  His H&H are stable.  He mobilized well, voided and pain controlled and was discharged home.    The remainder of the hospital course consisted of increasing mobilization and increasing intake of solids without difficulty.  CBC    Component Value Date/Time   WBC 5.8 02/12/2018 0450   RBC 3.40 (L) 02/12/2018 0450   HGB 11.0 (L) 02/12/2018 0450   HGB 14.6 09/25/2017 1420   HGB  14.4 05/02/2014 1156   HCT 35.3 (L) 02/12/2018 0450   HCT 43.0 09/25/2017 1420   HCT 43.9 05/02/2014 1156   PLT 129 (L) 02/12/2018 0450   PLT 228 09/25/2017 1420   MCV 103.8 (H) 02/12/2018 0450   MCV 98 (H) 09/25/2017 1420   MCV 96.1 05/02/2014 1156   MCH 32.4 02/12/2018 0450   MCHC 31.2 02/12/2018 0450   RDW 12.7 02/12/2018 0450   RDW 14.6 09/25/2017 1420   RDW 14.1 05/02/2014 1156   LYMPHSABS 1.7 11/30/2017 0902   LYMPHSABS 1.6 05/02/2014 1156   MONOABS 0.3 11/30/2017 0902   MONOABS 0.4 05/02/2014 1156   EOSABS 0.1 11/30/2017 0902   EOSABS 0.2 05/02/2014 1156   BASOSABS 0.0 11/30/2017 0902   BASOSABS 0.0 05/02/2014 1156    BMET    Component Value Date/Time   NA 139 02/10/2018 1129   NA 143 09/25/2017 1420   NA 139 05/02/2014 1156   K 4.3 02/10/2018 1129   K 4.1 05/02/2014 1156   CL 103 02/10/2018 1129   CO2 32 12/03/2017 0417   CO2 24 05/02/2014 1156   GLUCOSE 88 02/10/2018 1129   GLUCOSE 101 05/02/2014 1156   BUN 20 02/10/2018 1129   BUN 18 09/25/2017 1420   BUN 15.7 05/02/2014 1156   CREATININE 1.00 02/10/2018 1129   CREATININE 1.21 07/07/2014 1105   CREATININE 1.1 05/02/2014 1156   CALCIUM 9.4 12/03/2017 0417   CALCIUM 9.5 05/02/2014 1156   GFRNONAA >60 12/03/2017 0417   GFRAA >60 12/03/2017 0417     Discharge Instructions    Discharge patient   Complete by:  As directed    Discharge disposition:  01-Home or Self Care   Discharge patient date:  02/11/2018      Discharge Diagnosis:  PAD (peripheral artery disease) ([place]) [I73.9]  Secondary Diagnosis: Patient Active Problem List   Diagnosis Date Noted  . 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)   . post op Wound infection 11/28/2017  . Sepsis ([place]) 11/28/2017  . PAD (peripheral artery disease) ([place]) 11/17/2017  . CAP (community acquired pneumonia) 09/22/2017  . SOB (shortness of breath) 09/12/2017  . PTSD (post-traumatic stress disorder) 09/10/2017  . OSA  (obstructive sleep apnea) 09/10/2017  . No natural teeth 09/10/2017  . Hearing loss of both ears 09/10/2017  . Atypical angina ([place]) 08/28/2017  . Radial artery  occlusion, left ([place]) 08/28/2017  . Bipolar I disorder ([place]) 05/07/2017  . COPD exacerbation ([place]) 07/21/2016  . Acute on chronic respiratory failure with hypoxia ([place]) 07/21/2016  . Chronic diastolic heart failure ([place]) 07/21/2016  . Major depression, recurrent ([place]) 07/21/2016  . Abnormal stress test   . Coronary artery disease involving coronary bypass graft of native heart with angina pectoris ([place])   . Chest pain at rest 06/06/2014  . Peripheral arterial occlusive disease ([place])   . Hyperlipidemia with target LDL less than 70   . Smoker unmotivated to quit   . Essential hypertension   . COPD (chronic obstructive pulmonary disease) ([place])   . Pain of left lower extremity 02/01/2014  . Lung cancer ([place]) 05/15/2011  . 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 Left main coronary artery disease 07/08/2002  . 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 Anxiety   . Arthritis   . Complication of anesthesia    hx prolonged post op oxygen dependent/  hx hallucinations for 2 day post op lobectomy 2012  . COPD, severe ([place])   . Depression   . Essential hypertension   . Hearing loss of both ears    does wear his hearing aids  . History of arterial bypass of lower extremity    RIGHT FEM-POP  . History of CHF (congestive heart failure)   . History of DVT of lower extremity   . History of panic attacks   . Hyperlipidemia with target LDL less than 70   . Left main coronary artery disease 2004   a) Severe LM CAD 2004 -->s/p  cabg x4 (LIMA-LAD, free RIMA-RI, SVG-OM 2, SVG-RPDA); b) 06/2014: Abnormal Myoview --> c)cardiac Cath Jan 2016: pLM 70%, pLAD 40-50%- patent LIMA-LAD.  RI -competitive flow noted from RIMA-RI graft, circumflex, normal caliber with moderate OM1.  OM 2 occluded.  SVG-OM 2 patent. RCA  CTO after large RV M, faint R-R and LAD septal-PDA collaterals =--> new  . Neuropathy, peripheral   . 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 Nocturia   . OSA (obstructive sleep apnea)    uses O2  via [place]  --  intolerate to CPAP  . Peripheral arterial occlusive disease ([place])    2011   a) Gore-Tex graft right AK popA-BK popA --> b) 2012: thrombosed graft --> thrombectomy with dacron patch = 1 V runnof via Peroneal; c) 01/2014: R femoropopliteal bypass with left femoral vein;; followed by dr Early--  per last dopplers 06-28-2014 no change right graft,  50-74% stenosis common and left mid superficial femoral artery  . PTSD (post-traumatic stress disorder)    anxiety attack's---  [place] [facility]  . Recurrent productive cough    SMOKER'S COUGH  . 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  Medical management  . Smoker unmotivated to quit    Reportedly he came close to having a nervous breakdown when he last tried to quit  . 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 Wears glasses      Allergies as of 02/12/2018      Reactions   Aspirin Other (See Comments)   Avoids due to severe bruises   Crestor [rosuvastatin Calcium] Other (See Comments)   myalgia   Lipitor [atorvastatin Calcium] Other (See Comments)   myalgia   Zetia [ezetimibe] Other (See Comments)   myalgia   Zocor [simvastatin - High Dose] Other (See Comments)   myalgia    Aripiprazole Other (See Comments)   Altered mental status   Effexor [venlafaxine Hydrochloride] Itching   Morphine And Related Itching      Medication List    TAKE these medications   acetaminophen 500 MG tablet Commonly known as:  TYLENOL Take 1,000 mg by mouth every 6 (six) hours as needed for moderate pain or headache.   ADVAIR DISKUS 500-50 MCG/DOSE Aepb Generic drug:  Fluticasone-Salmeterol Inhale 1 puff into the lungs 2 (two) times daily.   albuterol (5 MG/ML) 0.5% nebulizer solution Commonly known as:  PROVENTIL Take 2.5 mg by nebulization every 6 (six) hours as needed for wheezing or shortness of breath.   albuterol 108 (90 Base) MCG/ACT inhaler Commonly known as:  PROVENTIL HFA;VENTOLIN HFA Inhale 1 puff into the lungs every 6 (six) hours as needed for wheezing or shortness of breath.   clonazePAM 1 MG tablet Commonly known as:  KLONOPIN Take 1 mg by mouth 2 (two) times daily.   clopidogrel 75 MG tablet Commonly known as:  PLAVIX Take 1 tablet (75 mg total) by mouth daily.   DULoxetine 30 MG capsule Commonly known as:  CYMBALTA Take 30-60 mg by mouth See admin instructions. 60mg  (2 capsules) every morning and 30mg  (1 capsule) daily at evening   gabapentin 100 MG capsule Commonly known as:  NEURONTIN Take 100 mg by mouth at bedtime.   HYDROGEL Gel 1 g by Does not apply route daily.   nitroGLYCERIN 0.4 MG SL tablet Commonly known as:  NITROSTAT Place 1 tablet (0.4 mg total) under the tongue every 5 (five) minutes as needed for chest pain.   oxyCODONE 5 MG immediate release tablet Commonly known as:  Oxy IR/ROXICODONE Take 1 tablet (5 mg total) by mouth every 6 (six) hours as needed for moderate pain.   OXYGEN Inhale 2 L into the lungs See admin instructions. Uses 2L at bedtime and if needed throughout the day -- intolerant to CPAP   prazosin 1 MG capsule Commonly known as:  MINIPRESS Take 1 mg by mouth at bedtime.   QUEtiapine 300 MG 24 hr  tablet Commonly known as:  SEROQUEL XR Take 300 mg by mouth at bedtime.   ramipril 2.5 MG capsule Commonly known as:  ALTACE Take 2.5 mg by mouth every morning.   SPIRIVA HANDIHALER 18 MCG inhalation capsule Generic drug:  tiotropium Place 18 mcg into inhaler and inhale daily.   traZODone 150 MG tablet Commonly known as:  DESYREL Take 0.5 tablets (75 mg total) by mouth at bedtime as needed for sleep. What changed:    how much to take  when to take this   Vitamin D3 1000 units Caps Take 1,000 Units by mouth every morning.   zolpidem 12.5 MG CR tablet Commonly known as:  AMBIEN CR Take 12.5 mg by mouth at bedtime.  Discharge Instructions: [facility] Discharge instructions Lower Extremity Bypass Surgery  Please refer to the following instruction for your post-procedure care. Your surgeon or physician assistant will discuss any changes with you.  Activity  You are encouraged to walk as much as you can. You can slowly return to normal activities during the month after your surgery. Avoid strenuous activity and heavy lifting until your doctor tells you it's OK. Avoid activities such as vacuuming or swinging a golf club. Do not drive until your doctor give the OK and you are no longer taking prescription pain medications. It is also normal to have difficulty with sleep habits, eating and bowel movement after surgery. These will go away with time.  Bathing/Showering  You may shower after you go home. Do not soak in a bathtub, hot tub, or swim until the incision heals completely.  Incision Care  Clean your incision with mild soap and water. Shower every day. Pat the area dry with a clean towel. You do not need a bandage unless otherwise instructed. Do not apply any ointments or creams to your incision. If you have open wounds you will be instructed how to care for them or a visiting nurse may be arranged for you. If you have staples or  sutures along your incision they will be removed at your post-op appointment. You may have skin glue on your incision. Do not peel it off. It will come off on its own in about one week.  Wash the groin wound with soap and water daily and pat dry. (No tub bath-only shower)  Then put a dry gauze or washcloth in the groin to keep this area dry to help prevent wound infection.  Do this daily and as needed.  Do not use Vaseline or neosporin on your incisions.  Only use soap and water on your incisions and then protect and keep dry.  Diet  Resume your normal diet. There are no special food restrictions following this procedure. A low fat/ low cholesterol diet is recommended for all patients with vascular disease. In order to heal from your surgery, it is CRITICAL to get adequate nutrition. Your body requires vitamins, minerals, and protein. Vegetables are the best source of vitamins and minerals. Vegetables also provide the perfect balance of protein. Processed food has little nutritional value, so try to avoid this.  Medications  Resume taking all your medications unless your doctor or Physician Assistant tells you not to. If your incision is causing pain, you may take over-the-counter pain relievers such as acetaminophen (Tylenol). If you were prescribed a stronger pain medication, please aware these medication can cause nausea and constipation. Prevent nausea by taking the medication with a snack or meal. Avoid constipation by drinking plenty of fluids and eating foods with high amount of fiber, such as fruits, vegetables, and grains. Take Colace 100 mg (an over-the-counter stool softener) twice a day as needed for constipation. Do not take Tylenol if you are taking prescription pain medications.  Follow Up  Our office will schedule a follow up appointment 2-3 weeks following discharge.  Please call us immediately for any of the following conditions  .Severe or worsening pain in your legs or feet  while at rest or while walking .Increase pain, redness, warmth, or drainage (pus) from your incision site(s) Fever of 101 degree or higher The swelling in your leg with the bypass suddenly worsens and becomes more painful than when you were in the hospital If you have  been instructed to feel your graft pulse then you should do so every day. If you can no longer feel this pulse, call the office immediately. Not all patients are given this instruction.  Leg swelling is common after leg bypass surgery.  The swelling should improve over a few months following surgery. To improve the swelling, you may elevate your legs above the level of your heart while you are sitting or resting. Your surgeon or physician assistant may ask you to apply an ACE wrap or wear compression (TED) stockings to help to reduce swelling.  Reduce your risk of vascular disease  Stop smoking. If you would like help call [facility] at 1-800-QUIT-NOW (279) 532-9276) or [place] at 5166328332.  Manage your cholesterol Maintain a desired weight Control your diabetes weight Control your diabetes Keep your blood pressure down  If you have any questions, please call the office at [phone]   Prescriptions given: 1.  Roxicet #30 No Refill 2.  Plavix 75mg  daily #30 No Refill  Disposition: home  Patient's condition: is Good  Follow up: 1. Dr. Donnetta Hutching in 2 weeks with ABI's   Leontine Locket, PA-C [facility] [phone] 02/12/2018  7:24 AM  - For VQI Registry use ---   Post-op:  Wound infection: No  Graft infection: No  Transfusion: No    If yes, n/a units given New Arrhythmia: No Ipsilateral amputation: No, [ ]  Minor, [ ]  BKA, [ ]  AKA Discharge patency: [x ] Primary, [ ]  Primary assisted, [ ]  Secondary, [ ]  Occluded Patency judged by: [x ] Dopper only, [ ]  Palpable graft pulse, []  Palpable distal pulse, [ ]  ABI inc. > 0.15, [ ]  Duplex Discharge ABI: R not done, L not done D/C Ambulatory  Status: Ambulatory  Complications: MI: No, [ ]  Troponin only, [ ]  EKG or Clinical CHF: No Resp failure:No, [ ]  Pneumonia, [ ]  Ventilator Chg in renal function: No, [ ]  Inc. Cr > 0.5, [ ]  Temp. Dialysis,  [ ]  Permanent dialysis Stroke: No, [ ]  Minor, [ ]  Major Return to OR: No  Reason for return to OR: [ ]  Bleeding, [ ]  Infection, [ ]  Thrombosis, [ ]  Revision  Discharge medications: Statin use:  No-allergy ASA use:  No-allergy Plavix use:  yes Beta blocker use: no CCB use:  No ACEI use:   Yes  ARB use:  no Coumadin use: no

## 2018-02-16 ENCOUNTER — Encounter: Payer: Self-pay | Admitting: Pulmonary Disease

## 2018-02-16 ENCOUNTER — Ambulatory Visit (INDEPENDENT_AMBULATORY_CARE_PROVIDER_SITE_OTHER): Payer: Medicare Other | Admitting: Pulmonary Disease

## 2018-02-16 VITALS — BP 122/60 | HR 82 | Ht 72.0 in | Wt 192.6 lb

## 2018-02-16 DIAGNOSIS — J432 Centrilobular emphysema: Secondary | ICD-10-CM

## 2018-02-16 DIAGNOSIS — I779 Disorder of arteries and arterioles, unspecified: Secondary | ICD-10-CM

## 2018-02-16 DIAGNOSIS — J9611 Chronic respiratory failure with hypoxia: Secondary | ICD-10-CM

## 2018-02-16 DIAGNOSIS — Z720 Tobacco use: Secondary | ICD-10-CM | POA: Diagnosis not present

## 2018-02-16 NOTE — Patient Instructions (Signed)
Follow up in 6 months 

## 2018-02-16 NOTE — Progress Notes (Signed)
[facility]  Chief Complaint  Patient presents with  . Follow-up    follow up emphysema. shortness of breath    Constitutional: BP 122/60 (BP Location: Left Arm, Cuff Size: Normal)   Pulse 82   Ht 6' (1.829 m)   Wt 192 lb 9.6 oz (87.4 kg)   SpO2 95%   BMI 26.12 kg/m   History of Present Illness: John Ball is a 77 y.o. male smoker with COPD with emphysema and chronic bronchitis, and chronic respiratory failure with hypoxia.  He has hx of NSCLC s/p Rt lower lobectomy.  He had fem pop bypass since last visit.  Got out of hospital last week.  He has cough with clear sputum.  Denies fever, chest pain, hemoptysis, or abdominal pain.  Uses albuterol 5 to 10 times per week.  Using 2 liters oxygen.  Comprehensive Respiratory Exam:  Appearance - well kempt, wearing oxygen ENMT - nasal mucosa moist, turbinates clear, midline nasal septum, edentuloug, no gingival bleeding, no oral exudates, no tonsillar hypertrophy Neck - no masses, trachea midline, no thyromegaly, no elevation in JVP Respiratory - decreased BS on Rt, faint crackles on Lt that clear with coughing, no wheeze CV - s1s2 regular rate and rhythm, no murmurs, ankle edema, radial pulses symmetric GI - soft, non tender Lymph - no adenopathy noted in neck and axillary areas MSK - surgical dressing on Lt leg clean Ext - no cyanosis, clubbing, or joint inflammation noted Skin - venous stasis changes Neuro - oriented to person, place, and time Psych - normal mood and affect   Assessment/Plan:  COPD with emphysema and chronic bronchitis. - continue spiriva, advair, prn albuterol  Chronic respiratory failure with hypoxia. - he has PaO2 < 60 and chronic leg edema in setting of COPD - continue 2 liters oxygen 24/7  Tobacco abuse. - encouraged him to keep up with smoking cessation efforts and reviewed options to assist with this   Patient Instructions  Follow up in 6  months    Chesley Mires, MD [place] 02/16/2018, 12:18 PM Pager:  [phone]  Flow Sheet  Pulmonary tests: CT chest 04/20/15 >> atherosclerosis, mild centrilobular/paraseptal emphysema, s/p RLL ectomy Spirometry 03/05/16 >> FEV1 1.21 (36%), FEV1% 54% ABG 03/07/16 >> pH 7.40, PaCO2 42.6, PaO2 59.3 CT angio chest 09/12/17 >> thickened Rt diaphragm, s/p RLLectomy, tree in bud (reviewed by me)  Past Medical History: He  has a past medical history of Anxiety, Arthritis, Complication of anesthesia, COPD, severe ([place]), Depression, Essential hypertension, Hearing loss of both ears, History of arterial bypass of lower extremity, History of CHF (congestive heart failure), History of DVT of lower extremity, History of panic attacks, Hyperlipidemia with target LDL less than 70, Left main coronary artery disease (2004), Neuropathy, peripheral, No natural teeth, Nocturia, OSA (obstructive sleep apnea), Peripheral arterial occlusive disease (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  Past Surgical History: He  has a past surgical history that includes Femoral-tibial Bypass Graft (Right, 01/10/2014); left heart catheterization with coronary/graft angiogram (N/A, 07/12/2014); Coronary artery bypass graft (07/27/2002); Cardiovascular stress test (last one  06-17-2014   dr  harding); Tonsillectomy; Femoral artery - popliteal artery bypass graft (Right, 10-24-2008   dr early); RIGHT ABOVE KNEE POPLITEAL GRAFT TO BELOW KNEE POPLITEAL ARTERY BYPASS WITH REVERSE SAPHENOUS VEIN (03-02-2010); THROMBECTOMY OF RIGHT FEMORAL TO ABOVE KNEE POPLITEAL GORETEX GRAFT  ANGIOPLASTY OF GORETEX AND SAPHENOUS VEIN JUNCTION AND ABOVE KNEE POPLITEAL ARTERY  (10-06-2010   DR EARLY); Video assisted thoracoscopy (vats)/wedge resection (Right, 05-01-2011    FORSYTH); Cardiac  catheterization (07-13-2002); LEFT ANKLE SURGERY (1990's); Hydrocele surgery (Left); Eye surgery (Left, Nov 2013); Colonoscopy (2008    WNL); Transurethral resection of bladder tumor (N/A, 02/21/2015); Cystoscopy w/ ureteral stent placement (N/A, 02/21/2015); NM MYOVIEW LTD (05/2014); Lower Extremity Angiography (Bilateral, 11/12/2017); ABDOMINAL AORTOGRAM (N/A, 11/12/2017); Endarterectomy femoral (Left, 11/17/2017); Wound exploration (Left, 11/28/2017); Application if wound vac (Left, 12/03/2017); ABDOMINAL AORTOGRAM W/LOWER EXTREMITY (Left, 02/10/2018); PERIPHERAL VASCULAR INTERVENTION (Left, 02/10/2018); and Femoral-popliteal Bypass Graft (Left, 02/11/2018).  Family History: His family history includes Alzheimer's disease (age of onset: 9) in his father; Other (age of onset: 83) in his mother; Stroke in his father and mother.  Social History: He  reports that he has been smoking cigarettes. He has a 27.50 pack-year smoking history. He has never used smokeless tobacco. He reports that he does not drink alcohol or use drugs.  Medications: Allergies as of 02/16/2018      Reactions   Aspirin Other (See Comments)   Avoids due to severe bruises   Crestor [rosuvastatin Calcium] Other (See Comments)   myalgia   Lipitor [atorvastatin Calcium] Other (See Comments)   myalgia   Zetia [ezetimibe] Other (See Comments)   myalgia   Zocor [simvastatin - High Dose] Other (See Comments)   myalgia   Aripiprazole Other (See Comments)   Altered mental status   Effexor [venlafaxine Hydrochloride] Itching   Morphine And Related Itching      Medication List        Accurate as of 02/16/18 12:18 PM. Always use your most recent med list.          acetaminophen 500 MG tablet Commonly known as:  TYLENOL Take 1,000 mg by mouth every 6 (six) hours as needed for moderate pain or headache.   ADVAIR DISKUS 500-50 MCG/DOSE Aepb Generic drug:  Fluticasone-Salmeterol Inhale 1 puff into the lungs 2 (two) times daily.    albuterol (5 MG/ML) 0.5% nebulizer solution Commonly known as:  PROVENTIL Take 2.5 mg by nebulization every 6 (six) hours as needed for wheezing or shortness of breath.   albuterol 108 (90 Base) MCG/ACT inhaler Commonly known as:  PROVENTIL HFA;VENTOLIN HFA Inhale 1 puff into the lungs every 6 (six) hours as needed for wheezing or shortness of breath.   clonazePAM 1 MG tablet Commonly known as:  KLONOPIN Take 1 mg by mouth 2 (two) times daily.   clopidogrel 75 MG tablet Commonly known as:  PLAVIX Take 1 tablet (75 mg total) by mouth daily.   DULoxetine 30 MG capsule Commonly known as:  CYMBALTA Take 30-60 mg by mouth See admin instructions. 60mg  (2 capsules) every morning and 30mg  (1 capsule) daily at evening   gabapentin 100 MG capsule Commonly known as:  NEURONTIN Take 100 mg by mouth at bedtime.   HYDROGEL Gel 1 g by Does not apply route daily.   nitroGLYCERIN 0.4 MG SL tablet Commonly known as:  NITROSTAT Place 1 tablet (0.4 mg total) under the tongue every 5 (five) minutes as needed for chest pain.   oxyCODONE 5 MG immediate release tablet Commonly known as:  Oxy IR/ROXICODONE Take 1 tablet (5 mg total) by mouth every 6 (six) hours as needed for moderate pain.   OXYGEN Inhale 2 L into the lungs See admin instructions. Uses 2L at bedtime and if needed throughout the day -- intolerant  to CPAP   prazosin 1 MG capsule Commonly known as:  MINIPRESS Take 1 mg by mouth at bedtime.   QUEtiapine 300 MG 24 hr tablet Commonly known as:  SEROQUEL XR Take 300 mg by mouth at bedtime.   ramipril 2.5 MG capsule Commonly known as:  ALTACE Take 2.5 mg by mouth every morning.   SPIRIVA HANDIHALER 18 MCG inhalation capsule Generic drug:  tiotropium Place 18 mcg into inhaler and inhale daily.   traZODone 150 MG tablet Commonly known as:  DESYREL Take 0.5 tablets (75 mg total) by mouth at bedtime as needed for sleep.   Vitamin D3 1000 units Caps Take 1,000 Units by mouth  every morning.   zolpidem 12.5 MG CR tablet Commonly known as:  AMBIEN CR Take 12.5 mg by mouth at bedtime.

## 2018-02-19 ENCOUNTER — Telehealth: Payer: Self-pay | Admitting: *Deleted

## 2018-02-19 NOTE — Telephone Encounter (Signed)
Call from patient's wife c/o increase pain in right leg 10/10 after being up on in no more than 10 minutes. Increase swelling in left foot. Claims no change in temp and toes are" a little dark" but they always have been. Relief of pain when he takes medication and sits with legs up. Agreeable to see NP on 02/20/18/ Instructed to report to [facility] ER for acute changes or worsening condition.

## 2018-02-20 ENCOUNTER — Ambulatory Visit (INDEPENDENT_AMBULATORY_CARE_PROVIDER_SITE_OTHER): Payer: Medicare Other | Admitting: Family

## 2018-02-20 ENCOUNTER — Other Ambulatory Visit: Payer: Self-pay

## 2018-02-20 ENCOUNTER — Encounter: Payer: Self-pay | Admitting: Family

## 2018-02-20 VITALS — BP 141/84 | HR 82 | Temp 97.0°F | Resp 22 | Ht 72.0 in | Wt 190.3 lb

## 2018-02-20 DIAGNOSIS — Z95828 Presence of other vascular implants and grafts: Secondary | ICD-10-CM

## 2018-02-20 DIAGNOSIS — I779 Disorder of arteries and arterioles, unspecified: Secondary | ICD-10-CM

## 2018-02-20 DIAGNOSIS — F17200 Nicotine dependence, unspecified, uncomplicated: Secondary | ICD-10-CM

## 2018-02-20 DIAGNOSIS — M79604 Pain in right leg: Secondary | ICD-10-CM

## 2018-02-20 NOTE — Patient Instructions (Addendum)
Laboratory: CBC:    Component Value Date/Time   WBC 5.8 02/12/2018 0450   RBC 3.40 (L) 02/12/2018 0450   HGB 11.0 (L) 02/12/2018 0450   HGB 14.6 09/25/2017 1420   HGB 14.4 05/02/2014 1156   HCT 35.3 (L) 02/12/2018 0450   HCT 43.0 09/25/2017 1420   HCT 43.9 05/02/2014 1156   PLT 129 (L) 02/12/2018 0450   PLT 228 09/25/2017 1420   MCV 103.8 (H) 02/12/2018 0450   MCV 98 (H) 09/25/2017 1420   MCV 96.1 05/02/2014 1156   MCH 32.4 02/12/2018 0450   MCHC 31.2 02/12/2018 0450   RDW 12.7 02/12/2018 0450   RDW 14.6 09/25/2017 1420   RDW 14.1 05/02/2014 1156   LYMPHSABS 1.7 11/30/2017 0902   LYMPHSABS 1.6 05/02/2014 1156   MONOABS 0.3 11/30/2017 0902   MONOABS 0.4 05/02/2014 1156   EOSABS 0.1 11/30/2017 0902   EOSABS 0.2 05/02/2014 1156   BASOSABS 0.0 11/30/2017 0902   BASOSABS 0.0 05/02/2014 1156    BMP:    Component Value Date/Time   NA 139 02/10/2018 1129   NA 143 09/25/2017 1420   NA 139 05/02/2014 1156   K 4.3 02/10/2018 1129   K 4.1 05/02/2014 1156   CL 103 02/10/2018 1129   CO2 32 12/03/2017 0417   CO2 24 05/02/2014 1156   GLUCOSE 88 02/10/2018 1129   GLUCOSE 101 05/02/2014 1156   BUN 20 02/10/2018 1129   BUN 18 09/25/2017 1420   BUN 15.7 05/02/2014 1156   CREATININE 1.00 02/10/2018 1129   CREATININE 1.21 07/07/2014 1105   CREATININE 1.1 05/02/2014 1156   CALCIUM 9.4 12/03/2017 0417   CALCIUM 9.5 05/02/2014 1156   GFRNONAA >60 12/03/2017 0417   GFRAA >60 12/03/2017 0417    Coagulation: Lab Results  Component Value Date   INR 1.05 11/12/2017   INR 0.95 07/07/2014   INR 1.01 01/07/2014    To decrease swelling in your feet and legs: Elevate feet above slightly bent knees, feet above heart, overnight and 3-4 times per day for 20 minutes.   Steps to Quit Smoking Smoking tobacco can be bad for your health. It can also affect almost every organ in your body. Smoking puts you and people around you at risk for many serious long-lasting (chronic) diseases.  Quitting smoking is hard, but it is one of the best things that you can do for your health. It is never too late to quit. What are the benefits of quitting smoking? When you quit smoking, you lower your risk for getting serious diseases and conditions. They can include:  Lung cancer or lung disease.  Heart disease.  Stroke.  Heart attack.  Not being able to have children (infertility).  Weak bones (osteoporosis) and broken bones (fractures).  If you have coughing, wheezing, and shortness of breath, those symptoms may get better when you quit. You may also get sick less often. If you are pregnant, quitting smoking can help to lower your chances of having a baby of low birth weight. What can I do to help me quit smoking? Talk with your doctor about what can help you quit smoking. Some things you can do (strategies) include:  Quitting smoking totally, instead of slowly cutting back how much you smoke over a period of time.  Going to in-person counseling. You are more likely to quit if you go to many counseling sessions.  Using resources and support systems, such as: ? Database administrator with a Social worker. ? Phone quitlines. ?  Printed Furniture conservator/restorer. ? Support groups or group counseling. ? Text messaging programs. ? Mobile phone apps or applications.  Taking medicines. Some of these medicines may have nicotine in them. If you are pregnant or breastfeeding, do not take any medicines to quit smoking unless your doctor says it is okay. Talk with your doctor about counseling or other things that can help you.  Talk with your doctor about using more than one strategy at the same time, such as taking medicines while you are also going to in-person counseling. This can help make quitting easier. What things can I do to make it easier to quit? Quitting smoking might feel very hard at first, but there is a lot that you can do to make it easier. Take these steps:  Talk to your family and  friends. Ask them to support and encourage you.  Call phone quitlines, reach out to support groups, or work with a Social worker.  Ask people who smoke to not smoke around you.  Avoid places that make you want (trigger) to smoke, such as: ? Bars. ? Parties. ? Smoke-break areas at work.  Spend time with people who do not smoke.  Lower the stress in your life. Stress can make you want to smoke. Try these things to help your stress: ? Getting regular exercise. ? Deep-breathing exercises. ? Yoga. ? Meditating. ? Doing a body scan. To do this, close your eyes, focus on one area of your body at a time from head to toe, and notice which parts of your body are tense. Try to relax the muscles in those areas.  Download or buy apps on your mobile phone or tablet that can help you stick to your quit plan. There are many free apps, such as QuitGuide from the [employer] Office manager for Disease Control and Prevention). You can find more support from smokefree.gov and other websites.  This information is not intended to replace advice given to you by your health care provider. Make sure you discuss any questions you have with your health care provider. Document Released: 04/20/2009 Document Revised: 02/20/2016 Document Reviewed: 11/08/2014 Elsevier Interactive Patient Education  2018 [employer].        Peripheral Vascular Disease Peripheral vascular disease (PVD) is a disease of the blood vessels that are not part of your heart and brain. A simple term for PVD is poor circulation. In most cases, PVD narrows the blood vessels that carry blood from your heart to the rest of your body. This can result in a decreased supply of blood to your arms, legs, and internal organs, like your stomach or kidneys. However, it most often affects a person's lower legs and feet. There are two types of PVD.  Organic PVD. This is the more common type. It is caused by damage to the structure of blood vessels.  Functional PVD.  This is caused by conditions that make blood vessels contract and tighten (spasm).  Without treatment, PVD tends to get worse over time. PVD can also lead to acute ischemic limb. This is when an arm or limb suddenly has trouble getting enough blood. This is a medical emergency. Follow these instructions at home:  Take medicines only as told by your doctor.  Do not use any tobacco products, including cigarettes, chewing tobacco, or electronic cigarettes. If you need help quitting, ask your doctor.  Lose weight if you are overweight, and maintain a healthy weight as told by your doctor.  Eat a diet that is low in  fat and cholesterol. If you need help, ask your doctor.  Exercise regularly. Ask your doctor for some good activities for you.  Take good care of your feet. ? Wear comfortable shoes that fit well. ? Check your feet often for any cuts or sores. Contact a doctor if:  You have cramps in your legs while walking.  You have leg pain when you are at rest.  You have coldness in a leg or foot.  Your skin changes.  You are unable to get or have an erection (erectile dysfunction).  You have cuts or sores on your feet that are not healing. Get help right away if:  Your arm or leg turns cold and blue.  Your arms or legs become red, warm, swollen, painful, or numb.  You have chest pain or trouble breathing.  You suddenly have weakness in your face, arm, or leg.  You become very confused or you cannot speak.  You suddenly have a very bad headache.  You suddenly cannot see. This information is not intended to replace advice given to you by your health care provider. Make sure you discuss any questions you have with your health care provider. Document Released: 09/18/2009 Document Revised: 11/30/2015 Document Reviewed: 12/02/2013 Elsevier Interactive Patient Education  2017 [employer].

## 2018-02-20 NOTE — Progress Notes (Addendum)
[facility] [place]   CC: Follow up peripheral artery occlusive disease  History of Present Illness John Ball is a 77 y.o. male who is s/p left external iliac, common femoral and profundus femoral endarterectomy with Dacron patch angioplasty and reimplantation of native superficial femoral artery into Dacron patch on 11-17-17 by Dr. Donnetta Hutching for critical left lower limb ischemia.  He is then s/p Left femoral to below-knee popliteal bypass with a 6 mm propatent ringed Gore-Tex graft on 02-11-18 by Dr. Donnetta Hutching for critical limb ischemia left leg.   He initially had a right femoral to above-knee popliteal bypass in April 2010. This was followed by right above-knee to below-knee popliteal bypass in August of 2011. He presented with occlusion of this and underwent thrombectomy and revision of his femoral-popliteal bypass in April of 2012.  He is also s/p right common femoral and profundus femoris artery thrombectomy and endarterectomy; right femoral to posterior tibial bypass with saphenous vein harvested from left leg on 01/10/14. He has a history of DVT.   He returned on 11-27-17 after call from wife on 11-25-17 that patient is having drainage from left groin. No redness or heat at site. Denies fever or chills, and changing dressing TID. Wife states drainage is watery, yellow tinged, and copious amounts. Pt states he had a chill that morning, denied feeling feverish. He c/o pain in his left foot only, no groin pain.  He apparently had not been elevating his left foot adequately, and had 2+ non pitting edema in left foot and lower leg.   He returns today after call from patient's wife c/o increase pain in right leg 10/10 after being up on in no more than 10 minutes. Increase swelling in left foot. Claims no change in temp and toes are" a little dark" but they always have been. Relief of pain when he takes medication and sits with legs up. He elevates his feet in a recliner which  are not above his heart. He states his right calf hurts after walking across a parking lot, stops him before his left leg does.  Pt wife states the oxycodone "makes him crazy as a bed bug and I'm not giving him any more of that". She states he has tried tramadol in the past which caused nausea and vomiting.   11-12-17 aortogram by Dr. 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  The right common femoral artery has a patch on it and there is significant scar tissue there does appear to be flow limitation with catheter in place of the external iliac artery on the right.  The right side has a bypass that goes to the popliteal artery with dominant flow via the posterior tibial.  On the left side there is external iliac artery disease as well as a subtotal occlusion of the common femoral artery and the profunda femoris.  Superficial femoral artery is patent proximally occludes in the mid thigh reconstitutes a heavily calcified above-knee popliteal.  Below the knee popliteal does appear to be free of calcification but the tibioperoneal trunk is possibly disease.  The anterior tibial artery is occluded.  The dominant runoff to the foot is via the posterior tibial artery which appears free of disease.  He has been on supplemental O2 since 2012, when right lung cancer was diagnosed, and he had a right lobectomy.    Diabetic: No Tobacco usesmoker (1/2ppd, started at age 36 yrs)  Pt meds include: Statin :No, statins caused itching ASA: No,  record indicates that ASA caused GI bleed, but wife states this is not the case, that he bruised easily taking 81 mg ASA daily Other anticoagulants/antiplatelets: no     Past Medical History:  Diagnosis Date  . Anxiety   . Arthritis   . Complication of anesthesia    hx prolonged post op oxygen dependent/  hx hallucinations for 2 day post op lobectomy 2012  . COPD, severe ([place])   . Depression   . Essential hypertension   . Hearing loss of both ears     does wear his hearing aids  . History of arterial bypass of lower extremity    RIGHT FEM-POP  . History of CHF (congestive heart failure)   . History of DVT of lower extremity   . History of panic attacks   . Hyperlipidemia with target LDL less than 70   . Left main coronary artery disease 2004   a) Severe LM CAD 2004 -->s/p  cabg x4 (LIMA-LAD, free RIMA-RI, SVG-OM 2, SVG-RPDA); b) 06/2014: Abnormal Myoview --> c)cardiac Cath Jan 2016: pLM 70%, pLAD 40-50%- patent LIMA-LAD.  RI -competitive flow noted from RIMA-RI graft, circumflex, normal caliber with moderate OM1.  OM 2 occluded.  SVG-OM 2 patent. RCA CTO after large RV M, faint R-R and LAD septal-PDA collaterals =--> new  . Neuropathy, peripheral   . 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 Nocturia   . OSA (obstructive sleep apnea)    uses O2  via [place]  --  intolerate to CPAP  . Peripheral arterial occlusive disease ([place])    2011   a) Gore-Tex graft right AK popA-BK popA --> b) 2012: thrombosed graft --> thrombectomy with dacron patch = 1 V runnof via Peroneal; c) 01/2014: R femoropopliteal bypass with left femoral vein;; followed by dr Early--  per last dopplers 06-28-2014 no change right graft,  50-74% stenosis common and left mid superficial femoral artery  . PTSD (post-traumatic stress disorder)    anxiety attack's---  [place] [employer]  . Recurrent productive cough    SMOKER'S COUGH  . 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  Medical management  . Smoker unmotivated to quit    Reportedly he came close to having a nervous breakdown when he last tried to quit  . 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 Wears glasses     Social History Social History   Tobacco Use  . Smoking status: Current Every Day Smoker    Years: 55.00    Types: Cigarettes  . Smokeless tobacco: Never Used  . Tobacco comment: 3/4 pk per day  Substance Use Topics  . Alcohol use: No  . Drug use: No    Family History Family History  Problem Relation Age of Onset  . Other Mother 35       Abdominal Aortic  Aneurysm   . Stroke Mother   . Alzheimer's disease Father 87  . Stroke Father     Past Surgical History:  Procedure Laterality Date  . ABDOMINAL AORTOGRAM N/A 11/12/2017   Procedure: ABDOMINAL AORTOGRAM;  Surgeon: Waynetta Sandy, MD;  Location: [facility];  Service: Cardiovascular;  Laterality: N/A;  . ABDOMINAL AORTOGRAM W/LOWER EXTREMITY Left 02/10/2018   Procedure: ABDOMINAL AORTOGRAM W/LOWER EXTREMITY;  Surgeon: Serafina Mitchell, MD;  Location: [facility];  Service: Cardiovascular;  Laterality: Left;  . APPLICATION OF WOUND VAC Left 12/03/2017   Procedure: EXCHANGE OF WOUND VAC LEFT GROIN,;  Surgeon: Rosetta Posner, MD;  Location: [facility];  Service: Vascular;  Laterality: Left;  . CARDIAC CATHETERIZATION  07-13-2002   Ischemia RCA region on Cardiolite// 60-70% ostial left main, 50% midCx, 60-70% mid RI,  70% mRCA, 90% JeNu and crux 75% RCA ,  95% PLA//  severe LM and 3Vessel CAD, perserved LV, ef 60%  . CARDIOVASCULAR STRESS TEST  last one  06-17-2014   dr  Ellyn Hack   Carlton Adam study with no exercise; Intermediate Risk Study;   moderate size and intensity, partially reversible inferior septal defect consistent with prior infarct and mild to moderate peri-infarct ischemia;  mild hypokinesis of the inferior septal wall,  normal LV function, ef 56%  . COLONOSCOPY   2008    WNL  . CORONARY ARTERY BYPASS GRAFT  07/27/2002   LIMA-LAD, freeRIMA-RI,SVG-OM2, SVG-rPDA; Dr. Roxan Hockey  . CYSTOSCOPY W/ URETERAL STENT PLACEMENT N/A 02/21/2015   Procedure: BILATERAL RETROGRADE PYELOGRAM;  Surgeon: Festus Aloe, MD;  Location: [facility];  Service: Urology;  Laterality: N/A;  . ENDARTERECTOMY FEMORAL Left 11/17/2017   Procedure: LEFT Femoral Endarterectomy with Patch Angioplasty;  Surgeon: Rosetta Posner, MD;  Location: [facility];  Service: Vascular;  Laterality: Left;  . EYE SURGERY Left Nov 2013  . FEMORAL ARTERY - POPLITEAL ARTERY BYPASS GRAFT Right 10-24-2008   dr early   w/ GoreTex graft  . FEMORAL-POPLITEAL BYPASS GRAFT Left 02/11/2018   Procedure: BYPASS GRAFT LOWER EXTREMITY;  Surgeon: Rosetta Posner, MD;  Location: [facility];  Service: Vascular;  Laterality: Left;  . FEMORAL-TIBIAL BYPASS GRAFT Right 01/10/2014   Procedure: Right Femoral to Posterior Tibial Bypass Graft using Left Leg Vein, Thrombectomy Right Common Femoral and Profunda of Leg . ;  Surgeon: Rosetta Posner, MD;  Location: [facility];  Service: Vascular;  Laterality: Right;  . HYDROCELE EXCISION Left   . LEFT ANKLE SURGERY  1990's  . LEFT HEART CATHETERIZATION WITH CORONARY/GRAFT ANGIOGRAM N/A 07/12/2014   Procedure: LEFT HEART CATHETERIZATION WITH Beatrix Fetters;  Surgeon: Troy Sine, MD;  Location: [facility];  Service: Cardiovascular;  Laterality: N/A;  pLM 70%, pLAD 40-50%- patent LIMA-LAD.  RI -competitive flow noted from fRIMA-RI graft, circumflex, normal caliber with moderate OM1.  OM 2 occluded.  SVG-OM 2 patent. RCA CTO after large RVM, faint R-R and LAD septal-PDA collateral  . LOWER EXTREMITY ANGIOGRAPHY Bilateral 11/12/2017   Procedure: LOWER EXTREMITY ANGIOGRAPHY - Left Lower;  Surgeon: Waynetta Sandy, MD;  Location: [facility];  Service: Cardiovascular;  Laterality: Bilateral;  . NM MYOVIEW LTD  05/2014   Moderate sized, moderate intensity/partially  reversible inferior defect consistent with prior infarct mild/moderate peri-infarct ischemia.  Inferoseptal HK.  INTERMEDIATE RISK. --> Cath showed CTO of Native RCA & SVG-rPDA.  Marland Kitchen PERIPHERAL VASCULAR INTERVENTION Left 02/10/2018   Procedure: PERIPHERAL VASCULAR INTERVENTION;  Surgeon: Serafina Mitchell,  MD;  Location: [facility];  Service: Cardiovascular;  Laterality: Left;  left common iliac stent, attempted left SFA stent  . RIGHT ABOVE KNEE POPLITEAL GRAFT TO BELOW KNEE POPLITEAL ARTERY BYPASS WITH REVERSE SAPHENOUS VEIN  03-02-2010  . THROMBECTOMY OF RIGHT FEMORAL TO ABOVE KNEE POPLITEAL GORETEX GRAFT ANGIOPLASTY OF GORETEX AND SAPHENOUS VEIN JUNCTION AND ABOVE KNEE POPLITEAL ARTERY   10-06-2010   DR EARLY  . TONSILLECTOMY    . TRANSURETHRAL RESECTION OF BLADDER TUMOR N/A 02/21/2015   Procedure: TRANSURETHRAL RESECTION OF BLADDER TUMOR (TURBT);  Surgeon: Festus Aloe, MD;  Location: [facility];  Service: Urology;  Laterality: N/A;  . VIDEO ASSISTED THORACOSCOPY (VATS)/WEDGE RESECTION Right 05-01-2011    FORSYTH   LOWER LOBECTOMY W/  NODE DISSECTION  . WOUND EXPLORATION Left 11/28/2017   Procedure: WOUND EXPLORATION GROIN, WASHOUT AND WOUND VAC APPLICATION;  Surgeon: Conrad Minocqua, MD;  Location: [facility];  Service: Vascular;  Laterality: Left;    Allergies  Allergen Reactions  . Aspirin Other (See Comments)    Avoids due to severe bruises  . Crestor [Rosuvastatin Calcium] Other (See Comments)    myalgia  . Lipitor [Atorvastatin Calcium] Other (See Comments)    myalgia  . Zetia [Ezetimibe] Other (See Comments)    myalgia  . Zocor [Simvastatin - High Dose] Other (See Comments)    myalgia  . Aripiprazole Other (See Comments)    Altered mental status  . Effexor [Venlafaxine Hydrochloride] Itching  . Morphine And Related Itching    Current Outpatient Medications  Medication Sig Dispense Refill  . acetaminophen (TYLENOL) 500 MG tablet Take 1,000 mg by mouth every  6 (six) hours as needed for moderate pain or headache.     . ADVAIR DISKUS 500-50 MCG/DOSE AEPB Inhale 1 puff into the lungs 2 (two) times daily.     Marland Kitchen albuterol (PROVENTIL HFA;VENTOLIN HFA) 108 (90 BASE) MCG/ACT inhaler Inhale 1 puff into the lungs every 6 (six) hours as needed for wheezing or shortness of breath.    Marland Kitchen albuterol (PROVENTIL) (5 MG/ML) 0.5% nebulizer solution Take 2.5 mg by nebulization every 6 (six) hours as needed for wheezing or shortness of breath.    . Carbomer Gel Base (HYDROGEL) GEL 1 g by Does not apply route daily. 100 g 0  . Cholecalciferol (VITAMIN D3) 1000 UNITS CAPS Take 1,000 Units by mouth every morning.     . clonazePAM (KLONOPIN) 1 MG tablet Take 1 mg by mouth 2 (two) times daily.     . clopidogrel (PLAVIX) 75 MG tablet Take 1 tablet (75 mg total) by mouth daily. 30 tablet 0  . DULoxetine (CYMBALTA) 30 MG capsule Take 30-60 mg by mouth See admin instructions. 60mg  (2 capsules) every morning and 30mg  (1 capsule) daily at evening    . gabapentin (NEURONTIN) 100 MG capsule Take 100 mg by mouth at bedtime.     . nitroGLYCERIN (NITROSTAT) 0.4 MG SL tablet Place 1 tablet (0.4 mg total) under the tongue every 5 (five) minutes as needed for chest pain. 25 tablet 6  . oxyCODONE (OXY IR/ROXICODONE) 5 MG immediate release tablet Take 1 tablet (5 mg total) by mouth every 6 (six) hours as needed for moderate pain. 30 tablet 0  . OXYGEN Inhale 2 L into the lungs See admin instructions. Uses 2L at bedtime and if needed throughout the day -- intolerant to CPAP    . prazosin (MINIPRESS) 1 MG capsule Take 1 mg by mouth at bedtime.    Marland Kitchen  QUEtiapine (SEROQUEL XR) 300 MG 24 hr tablet Take 300 mg by mouth at bedtime.     . ramipril (ALTACE) 2.5 MG capsule Take 2.5 mg by mouth every morning.     . tiotropium (SPIRIVA HANDIHALER) 18 MCG inhalation capsule Place 18 mcg into inhaler and inhale daily.    . traZODone (DESYREL) 150 MG tablet Take 0.5 tablets (75 mg total) by mouth at bedtime as  needed for sleep. (Patient taking differently: Take 150 mg by mouth at bedtime. ) 10 tablet 0  . zolpidem (AMBIEN CR) 12.5 MG CR tablet Take 12.5 mg by mouth at bedtime.     No current facility-administered medications for this visit.     ROS: See HPI for pertinent positives and negatives.   Physical Examination  Vitals:   02/20/18 1129  BP: (!) 141/84  Pulse: 82  Resp: (!) 22  Temp: (!) 97 F (36.1 C)  TempSrc: Oral  SpO2: 100%  Weight: 190 lb 4.8 oz (86.3 kg)  Height: 6' (1.829 m)   Body mass index is 25.81 kg/m.   General: A&O x 3 male with odor of stale cigarette smoke  Gait: slow, steady HENT: no gross abnormalities  Eyes: PERRLA Pulmonary: limited air movement in right posterior fields,no rales, rhonchi, or wheezing.He has pursed lips expirations at rest, is dyspneic with walking. Cardiac: regular rhythmand rate, nodetected murmur.     Carotid Bruits Left Right   Negative Negative   Abdominal aortic pulse is notpalpable. Radial pulses: are: 1+ right, 2+ palpable left radial pulse, 2+ palpable right brachial pulse     General: A&O x 3, WDWN, chronically ill appearing make. Gait: slow, steady  HENT: No gross abnormalities.  Eyes: PERRLA. Pulmonary: limited air movement in right posterior fields,no rales, rhonchi, or wheezing.He has pursed lips expirations at rest, is dyspneic with walking. Cardiac: regular rhythmand rate, nodetected murmur.     Carotid Bruits Right Left   Negative Negative   Abdominal aortic pulse is notpalpable. Radial pulses: are: 1+ right, 2+ palpable left radial pulse, 2+ palpable right brachial pulse          Left foot     Left medial metatarsal head prominence ulcer is healing compared to recent previous visit               VASCULAR EXAM: Extremities: Left groin incision with staples, no swelling or erythema, no drainage. Small ulcers at dorsal aspects of left  toes 2-4 are contracting and healing. Left lower leg and foot with reperfusion edema.  No ulcers or swelling in the right lower extremity.                                                                                                           LE Pulses Right Left       FEMORAL  2+ palpable  2+ palpable        POPLITEAL  not palpable   not palpable       POSTERIOR TIBIAL  not palpable, + Doppler signal   not palpable, +  Doppler signal        DORSALIS PEDIS      ANTERIOR TIBIAL not palpable, + Doppler signal  not palpable, + Doppler signal    Abdomen: soft, NT, no palpable masses. Skin: no rashes, no cellulitis, see Extremities Musculoskeletal: Generalized mild muscle wasting and atrophy.  Neurologic: A&O X 3; appropriate affect, Sensation is normal; MOTOR FUNCTION:  moving all extremities equally, motor strength 5/5 throughout. Speech is fluent/normal. CN 2-12 intact. Psychiatric: Thought content is normal, mood appropriate for clinical situation.     ASSESSMENT: John Ball is a 77 y.o. male who  is s/p left external iliac, common femoral and profundus femoral endarterectomy with Dacron patch angioplasty and reimplantation of native superficial femoral artery into Dacron patch on 11-17-17 by Dr. Donnetta Hutching for critical left lower limb ischemia.  He is then s/p Left femoral to below-knee popliteal bypass with a 6 mm propatent ringed Gore-Tex graft on 02-11-18 by Dr. Donnetta Hutching for critical limb ischemia left leg.   He initially had a right femoral to above-knee popliteal bypass in April 2010. This was followed by right above-knee to below-knee popliteal bypass in August of 2011. He presented with occlusion of this and underwent thrombectomy and revision of his femoral-popliteal bypass in April of 2012.  He is also s/p right common femoral and profundus femoris artery thrombectomy and endarterectomy; right femoral to posterior tibial bypass with saphenous vein harvested from left leg on  01/10/14. He has a history of DVT.   He has a hx of lung cancer and right lobectomy.  Pt and wife continue to smoke. He does not have DM.    Dr. Donzetta Matters spoke with pt and wife and examined pt.  Reperfusion edema in left lower leg and foot: proper elevation of foot and leg instructions given to pt and wife. + Doppler signals in left foot. Worsening claudication of right calf: need to heal left leg before intervening in right leg.    Will move up October 2019 appointment with ABI and Dr. Donnetta Hutching to about 3 weeks from today, will remove staples in left groin at that time.  Use half of a Percocet for pain; if this still causes delerium, wife advised to give pt up to 1000 mg tylenol, no more often than every 8 hours.  Wife states tramadol causes pt nausea and vomiting.     PLAN:  Will move up October 2019 appointment with ABI and Dr. Donnetta Hutching to about 3 weeks from today, will remove staples in left groin at that time.    Pt and wife were not receptive to smoking cessation counseling and resources.   I discussed in depth with the patient the nature of atherosclerosis, and emphasized the importance of maximal medical management including strict control of blood pressure, blood glucose, and lipid levels, obtaining regular exercise, and cessation of smoking.  The patient is aware that without maximal medical management the underlying atherosclerotic disease process will progress, limiting the benefit of any interventions.  The patient was given information about PAD including signs, symptoms, treatment, what symptoms should prompt the patient to seek immediate medical care, and risk reduction measures to take.  Clemon Chambers, RN, MSN, FNP-C [facility] Phone: [phone]  Clinic MD: Donzetta Matters  02/20/18 12:01 PM

## 2018-02-23 ENCOUNTER — Other Ambulatory Visit: Payer: Self-pay

## 2018-02-23 DIAGNOSIS — M79604 Pain in right leg: Secondary | ICD-10-CM

## 2018-02-23 DIAGNOSIS — I779 Disorder of arteries and arterioles, unspecified: Secondary | ICD-10-CM

## 2018-02-23 NOTE — Addendum Note (Signed)
Addendum  created 02/23/18 1744 by Belinda Block, MD   [facility] Staff edited

## 2018-03-13 ENCOUNTER — Ambulatory Visit (INDEPENDENT_AMBULATORY_CARE_PROVIDER_SITE_OTHER): Payer: Medicare Other | Admitting: Physician Assistant

## 2018-03-13 ENCOUNTER — Ambulatory Visit: Payer: Medicare Other

## 2018-03-13 ENCOUNTER — Encounter (HOSPITAL_COMMUNITY): Payer: Medicare Other

## 2018-03-13 ENCOUNTER — Ambulatory Visit (HOSPITAL_COMMUNITY)
Admission: RE | Admit: 2018-03-13 | Discharge: 2018-03-13 | Disposition: A | Discharge status: Home / Self Care | Payer: Medicare Other | Source: Ambulatory Visit | Attending: Family | Admitting: Family

## 2018-03-13 ENCOUNTER — Other Ambulatory Visit: Payer: Self-pay

## 2018-03-13 VITALS — BP 161/81 | HR 83 | Temp 97.1°F | Resp 20 | Ht 72.0 in | Wt 193.1 lb

## 2018-03-13 DIAGNOSIS — L97521 Non-pressure chronic ulcer of other part of left foot limited to breakdown of skin: Secondary | ICD-10-CM

## 2018-03-13 DIAGNOSIS — I779 Disorder of arteries and arterioles, unspecified: Secondary | ICD-10-CM | POA: Diagnosis not present

## 2018-03-13 DIAGNOSIS — M79604 Pain in right leg: Secondary | ICD-10-CM | POA: Insufficient documentation

## 2018-03-13 NOTE — Progress Notes (Signed)
POST OPERATIVE OFFICE NOTE    CC:  F/u for surgery  HPI:  This is a 77 y.o. male who is here for follow up evaluation s/p Left femoral to below-knee popliteal bypass with a 6 mm propatent ringed Gore-Tex graft.   Previous history:  This is a 76 y.o. male who presents for evaluation of increased left foot pain, edema and erythema. 2-3 weeks ago he caught his foot under the screen door and cause scraps on the dorsum of his toes on the left foot. He states the pain is preventing him from sleeping at night and walking as much as he usually does.  He had undergone a left femoral endarterectomy and Dacron angioplasty in early May. He presented with drainage and opening of his left groin several weeks postop and went back for debridement and VAC placement on 12/03/2017. He has had excellent healing of this and is been taking hydrogel.This has healed. He was last seen on 01/06/2018 by Dr. Donnetta Hutching deemed stable condition. His arterial blood flow was stable with a very good Doppler flow in his left posterior tibial artery. 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 Superficial femoral artery is patent proximally occludes in the mid thigh reconstitutes a heavily calcified above-knee popliteal. Below the knee popliteal does appear to be free of calcification but the tibioperoneal trunk is possibly disease. The anterior tibial artery is occluded. The dominant runoff to the foot is via the posterior tibial artery which appears free of disease.  Hospital Course:  The patient was admitted to the hospital and taken to the [facility] lab and underwent:  Procedure Performed: 02/10/2018 1. Ultrasound-guided access, right femoral artery 2. Abdominal aortogram 3. Left lower extremity  runoff 4. Failed angioplasty, left superficial femoral artery 5. Left common iliac stent 6. Conscious sedation (89 minutes)   Followed by 02/11/2018 Left femoral to below-knee popliteal bypass with a 6 mm propatent ringed Gore-Tex graft.  He denise fever and chills.  He continues to have lower leg edema into his foot.  The swelling is gone first thing in the am.      Allergies  Allergen Reactions  . Aspirin Other (See Comments)    Avoids due to severe bruises  . Crestor [Rosuvastatin Calcium] Other (See Comments)    myalgia  . Lipitor [Atorvastatin Calcium] Other (See Comments)    myalgia  . Zetia [Ezetimibe] Other (See Comments)    myalgia  . Zocor [Simvastatin - High Dose] Other (See Comments)    myalgia  . Aripiprazole Other (See Comments)    Altered mental status  . Effexor [Venlafaxine Hydrochloride] Itching  . Morphine And Related Itching    Current Outpatient Medications  Medication Sig Dispense Refill  . acetaminophen (TYLENOL) 500 MG tablet Take 1,000 mg by mouth every 6 (six) hours as needed for moderate pain or headache.     . ADVAIR DISKUS 500-50 MCG/DOSE AEPB Inhale 1 puff into the lungs 2 (two) times daily.     Marland Kitchen albuterol (PROVENTIL HFA;VENTOLIN HFA) 108 (90 BASE) MCG/ACT inhaler Inhale 1 puff into the lungs every 6 (six) hours as needed for wheezing or shortness of breath.    Marland Kitchen albuterol (PROVENTIL) (5 MG/ML) 0.5% nebulizer solution Take 2.5 mg by nebulization every 6 (six) hours as needed for wheezing or shortness of breath.    . Carbomer Gel Base (HYDROGEL) GEL 1 g  by Does not apply route daily. 100 g 0  . Cholecalciferol (VITAMIN D3) 1000 UNITS CAPS Take 1,000 Units by mouth every morning.     . clonazePAM (KLONOPIN) 1 MG tablet Take 1 mg by mouth 2 (two) times daily.     . clopidogrel (PLAVIX) 75 MG tablet Take 1 tablet (75 mg total) by mouth daily. 30 tablet 0  . DULoxetine (CYMBALTA) 30 MG capsule Take 30-60 mg by mouth  See admin instructions. 60mg  (2 capsules) every morning and 30mg  (1 capsule) daily at evening    . gabapentin (NEURONTIN) 100 MG capsule Take 100 mg by mouth at bedtime.     . nitroGLYCERIN (NITROSTAT) 0.4 MG SL tablet Place 1 tablet (0.4 mg total) under the tongue every 5 (five) minutes as needed for chest pain. 25 tablet 6  . oxyCODONE (OXY IR/ROXICODONE) 5 MG immediate release tablet Take 1 tablet (5 mg total) by mouth every 6 (six) hours as needed for moderate pain. 30 tablet 0  . OXYGEN Inhale 2 L into the lungs See admin instructions. Uses 2L at bedtime and if needed throughout the day -- intolerant to CPAP    . prazosin (MINIPRESS) 1 MG capsule Take 1 mg by mouth at bedtime.    Marland Kitchen QUEtiapine (SEROQUEL XR) 300 MG 24 hr tablet Take 300 mg by mouth at bedtime.     . ramipril (ALTACE) 2.5 MG capsule Take 2.5 mg by mouth every morning.     . tiotropium (SPIRIVA HANDIHALER) 18 MCG inhalation capsule Place 18 mcg into inhaler and inhale daily.    . traZODone (DESYREL) 150 MG tablet Take 0.5 tablets (75 mg total) by mouth at bedtime as needed for sleep. (Patient taking differently: Take 150 mg by mouth at bedtime. ) 10 tablet 0  . zolpidem (AMBIEN CR) 12.5 MG CR tablet Take 12.5 mg by mouth at bedtime.     No current facility-administered medications for this visit.      ROS:  See HPI  Physical Exam:  Vitals:   03/13/18 1457 03/13/18 1458  BP: (!) 156/85 (!) 161/81  Pulse: 83   Resp: 20   Temp: (!) 97.1 F (36.2 C)   SpO2: 99%     Incision:  Left groin incision is well healed.  Staples were removed today, patient tolerated this well. Extremities:  Popliteal incision is well heal and calf is soft.  The wound on his toes are healing well and the erythema has dissipated.     Heart : RRR Lungs non labored breathing on constant O2 Abdomen:  Soft + BS ABI Right 0.80 biphasic Left 0.94 biphasic TBI 0.43  Assessment/Plan:  This is a 77 y.o. male who is s/p: Left femoral to below-knee  popliteal bypass with a 6 mm propatent ringed Gore-Tex graft  He has a patent by pass with improved ABI's and healing toe wounds.  He has dependent edema.  I asked him to elevate his LE as much as he can.  Activity as tolerates.    He will f/u with Dr. Donnetta Hutching in Oct. As planned.     Roxy Horseman , PA-C [facility] [phone]

## 2018-04-07 ENCOUNTER — Other Ambulatory Visit: Payer: Self-pay

## 2018-04-07 ENCOUNTER — Encounter (HOSPITAL_COMMUNITY): Payer: Medicare Other

## 2018-04-07 ENCOUNTER — Ambulatory Visit (INDEPENDENT_AMBULATORY_CARE_PROVIDER_SITE_OTHER): Payer: Self-pay | Admitting: Vascular Surgery

## 2018-04-07 ENCOUNTER — Encounter: Payer: Self-pay | Admitting: Vascular Surgery

## 2018-04-07 VITALS — BP 141/83 | HR 85 | Temp 97.6°F | Resp 20 | Ht 72.0 in | Wt 190.0 lb

## 2018-04-07 DIAGNOSIS — I779 Disorder of arteries and arterioles, unspecified: Secondary | ICD-10-CM

## 2018-04-07 NOTE — Progress Notes (Signed)
Vitals:   04/07/18 0916  BP: (!) 155/87  Pulse: 86  Resp: 20  Temp: 97.6 F (36.4 C)  TempSrc: Oral  SpO2: 99%  Weight: 190 lb (86.2 kg)  Height: 6' (1.829 m)

## 2018-04-07 NOTE — Progress Notes (Signed)
Patient name: John Ball MRN: [id number] DOB: 1941/07/07 Sex: male  REASON FOR VISIT: Follow-up left foot  HPI: John Ball is a 77 y.o. male here today for follow-up.  He had been seen in early September with excoriation over the dorsum of his foot from a superficial trauma.  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  He is healed this.  He is also healed his left femoral and popliteal incisions.  He does report occasional drainage from the upper incision of his popliteal he does have swelling in his leg worse during the end of the day  Current Outpatient Medications  Medication Sig Dispense Refill  . acetaminophen (TYLENOL) 500 MG tablet Take 1,000 mg by mouth every 6 (six) hours as needed for moderate pain or headache.     . ADVAIR DISKUS 500-50 MCG/DOSE AEPB Inhale 1 puff into the lungs 2 (two) times daily.     Marland Kitchen albuterol (PROVENTIL HFA;VENTOLIN HFA) 108 (90 BASE) MCG/ACT inhaler Inhale 1 puff into the lungs every 6 (six) hours as needed for wheezing or shortness of breath.    Marland Kitchen albuterol (PROVENTIL) (2.5 MG/3ML) 0.083% nebulizer solution     . albuterol (PROVENTIL) (5 MG/ML) 0.5% nebulizer solution Take 2.5 mg by nebulization every 6 (six) hours as needed for wheezing or shortness of breath.    . Carbomer Gel Base (HYDROGEL) GEL 1 g by Does not apply route daily. 100 g 0  . Cholecalciferol (VITAMIN D3) 1000 UNITS CAPS Take 1,000 Units by mouth every morning.     . clonazePAM (KLONOPIN) 1 MG tablet Take 1 mg by mouth 2 (two) times daily.     . clopidogrel (PLAVIX) 75 MG tablet Take 1 tablet (75 mg total) by mouth daily. 30 tablet 0  . DULoxetine (CYMBALTA) 30 MG capsule Take 30-60 mg by mouth See admin instructions. 60mg  (2 capsules) every morning and 30mg  (1 capsule) daily at evening    . gabapentin (NEURONTIN) 100 MG capsule Take 100 mg by mouth at bedtime.     . nitroGLYCERIN (NITROSTAT)  0.4 MG SL tablet Place 1 tablet (0.4 mg total) under the tongue every 5 (five) minutes as needed for chest pain. 25 tablet 6  . OXYGEN Inhale 2 L into the lungs See admin instructions. Uses 2L at bedtime and if needed throughout the day -- intolerant to CPAP    . prazosin (MINIPRESS) 1 MG capsule Take 1 mg by mouth at bedtime.    Marland Kitchen QUEtiapine (SEROQUEL XR) 300 MG 24 hr tablet Take 300 mg by mouth at bedtime.     . ramipril (ALTACE) 2.5 MG capsule Take 2.5 mg by mouth every morning.     . tiotropium (SPIRIVA HANDIHALER) 18 MCG inhalation capsule Place 18 mcg into inhaler and inhale daily.    . traZODone (DESYREL) 150 MG tablet Take 0.5 tablets (75 mg total) by mouth at bedtime as needed for sleep. (Patient taking differently: Take 150 mg by mouth at bedtime. ) 10 tablet 0  . zolpidem (AMBIEN CR) 12.5 MG CR tablet Take 12.5 mg by mouth at bedtime.    Marland Kitchen oxyCODONE (OXY IR/ROXICODONE) 5 MG immediate release tablet Take 1 tablet (5 mg total) by mouth every 6 (six) hours as needed for moderate pain. (Patient not taking: Reported on 04/07/2018) 30 tablet 0   No current facility-administered medications for this visit.      PHYSICAL EXAM: Vitals:   04/07/18 0916 04/07/18  0921  BP: (!) 155/87 (!) 141/83  Pulse: 86 85  Resp: 20   Temp: 97.6 F (36.4 C)   TempSrc: Oral   SpO2: 99%   Weight: 190 lb (86.2 kg)   Height: 6' (1.829 m)     GENERAL: The patient is a well-nourished male, in no acute distress. The vital signs are documented above. 2+ femoral pulse without evidence of false aneurysm.  Incision is completely healed.  Have moderate swelling with a well-perfused left foot.  Is completely healed excoriation on his foot  MEDICAL ISSUES: Stable.  We will continue his walking program.  I did explain the potential for compression, 20 to 30 mmHg for control of the swelling of his lower extremity.  This currently is mild to moderate and he is comfortable with continued observation only.  He has again  in 6 months with repeat noninvasive studies   Rosetta Posner, MD [facility] Tel [phone] Pager [phone]

## 2018-06-02 DIAGNOSIS — D485 Neoplasm of uncertain behavior of skin: Secondary | ICD-10-CM | POA: Diagnosis not present

## 2018-06-02 DIAGNOSIS — B079 Viral wart, unspecified: Secondary | ICD-10-CM | POA: Diagnosis not present

## 2018-08-17 ENCOUNTER — Ambulatory Visit (INDEPENDENT_AMBULATORY_CARE_PROVIDER_SITE_OTHER): Payer: Medicare Other | Admitting: Pulmonary Disease

## 2018-08-17 ENCOUNTER — Encounter: Payer: Self-pay | Admitting: Pulmonary Disease

## 2018-08-17 VITALS — BP 126/74 | HR 68 | Ht 72.0 in | Wt 189.0 lb

## 2018-08-17 DIAGNOSIS — R918 Other nonspecific abnormal finding of lung field: Secondary | ICD-10-CM

## 2018-08-17 DIAGNOSIS — J432 Centrilobular emphysema: Secondary | ICD-10-CM

## 2018-08-17 DIAGNOSIS — R0609 Other forms of dyspnea: Secondary | ICD-10-CM

## 2018-08-17 DIAGNOSIS — Z85118 Personal history of other malignant neoplasm of bronchus and lung: Secondary | ICD-10-CM

## 2018-08-17 DIAGNOSIS — J9611 Chronic respiratory failure with hypoxia: Secondary | ICD-10-CM | POA: Diagnosis not present

## 2018-08-17 DIAGNOSIS — R590 Localized enlarged lymph nodes: Secondary | ICD-10-CM

## 2018-08-17 DIAGNOSIS — Z720 Tobacco use: Secondary | ICD-10-CM | POA: Diagnosis not present

## 2018-08-17 DIAGNOSIS — J449 Chronic obstructive pulmonary disease, unspecified: Secondary | ICD-10-CM | POA: Diagnosis not present

## 2018-08-17 LAB — BASIC METABOLIC PANEL
BUN: 22 mg/dL (ref 6–23)
CO2: 30 mEq/L (ref 19–32)
CREATININE: 1 mg/dL (ref 0.40–1.50)
Calcium: 9.7 mg/dL (ref 8.4–10.5)
Chloride: 102 mEq/L (ref 96–112)
GFR: 72.74 mL/min (ref >60.00)
GLUCOSE: 97 mg/dL (ref 70–99)
Potassium: 4.4 mEq/L (ref 3.5–5.1)
Sodium: 140 mEq/L (ref 135–145)

## 2018-08-17 NOTE — Patient Instructions (Signed)
Lab test today Will schedule CT chest with IV contrast  Follow up in 2 weeks with Dr. Halford Chessman or Nurse Practitioner to review CT chest results

## 2018-08-17 NOTE — Progress Notes (Signed)
John John Ball, John John Ball, John John Ball    pt has increased SOB, cough w/ white mucus    Constitutional:  BP 126/74 (BP Location: Right Arm, Cuff Size: Normal)   Pulse 68   Ht 6' (1.829 m)   Wt 189 lb (85.7 kg)   SpO2 96%   BMI 25.63 kg/m   Past Medical History:  CAD s/p CABG, PTSD, PAD, OSA intolerant of CPAP, Neuropathy, HLD, DVT, diastolic CHF, HTN, Depression, OA, Anxiety  Brief Summary:  John John Ball is a 78 y.o. male smoker John Ball COPD John Ball emphysema John chronic bronchitis, John chronic respiratory failure John Ball hypoxia.  He has hx of NSCLC s/p Rt lower lobectomy.  He still smokes about 3/4 pack per day.  He gets winded easily.  Not very active.  Uses 2 liters oxygen 24/7.  Has cough John Ball clear sputum.  Does get sore in his chest from coughing.  Not having sinus congestion, sore throat, wheeze, fever, hemoptysis, abdominal pain, gland swelling, or leg swelling.  Weight has been steady.  Feels like he can use inhalers okay.  Had CT chest in March 2019 that showed tree in bud infiltrates John mediastinal adenopathy.  Physical Exam:   Appearance - wearing oxygen  ENMT - clear nasal mucosa, midline nasal  septum, no oral exudates, no LAN, trachea midline  Respiratory - normal chest wall, normal respiratory effort, no accessory muscle use, no wheeze/rales, decreased breath sounds  CV - s1s2 regular rate John rhythm, no murmurs, 1+ ankle peripheral edema, radial pulses symmetric  GI - soft, non tender, no masses  Lymph - no adenopathy noted in neck John axillary areas  MSK - normal gait  Ext - no cyanosis, clubbing, or joint inflammation noted  Skin - no rashes, lesions, or ulcers  Neuro - normal strength, oriented x 3  Psych - normal mood John affect  Discussion:  He has progressive symptoms of dyspnea, John productive cough.  He continues to smoke.  He has history of lung cancer.  CT chest  from March 2019 showed tree in bud John Ball infiltrates John mediastinal adenopathy.  Assessment/Plan:   John Ball infiltrates John mediastinal adenopathy John Ball history of lung cancer. - will repeat CT chest John Ball IV contrast - if infiltrates persist, then might need sputum sampling - if adenopathy has progressed then might need bronchoscopy  COPD John Ball emphysema John chronic bronchitis. - continue spiriva, advair, prn albuterol - he feels he has adequate technique John Ball his inhalers - discussed option of referral to John Ball rehab; he did not want to do this at present  Chronic respiratory failure John Ball hypoxia. - he has PaO2 < 60 John chronic leg edema in setting of COPD - continue 2 liters oxygen 24/7  Tobacco abuse. - he is not interested in smoking cessation at present - explained how continued tobacco abuse will accelerate decline in his lung function  Patient Instructions  Lab test today Will schedule CT chest John Ball IV contrast  Follow up in 2 weeks John Ball Dr. Halford John Ball or Nurse Practitioner to review CT chest results    John John Ball, John John Ball [place] Pager: [phone] 08/17/2018, 10:47 AM  Flow Sheet     John Ball tests:  CT chest 04/20/15 >> atherosclerosis, mild centrilobular/paraseptal emphysema, s/p RLL ectomy Spirometry 03/05/16 >> FEV1 1.21 (36%), FEV1% 54% ABG 03/07/16 >> pH 7.40, PaCO2 42.6, PaO2 59.3 CT angio chest 09/12/17 >> thickened Rt diaphragm, s/p RLLectomy, tree in bud  Cardiac tests:  Echo 09/26/17 >> EF 50 to 55%, grade 1 DD, PAS 36 mmHg  Medications:   Allergies as of 08/17/2018      Reactions   Aspirin Other (See Comments)   Avoids due to severe bruises   Crestor [rosuvastatin Calcium] Other (See Comments)   myalgia   Lipitor [atorvastatin Calcium] Other (See Comments)   myalgia   Zetia [ezetimibe] Other (See Comments)   myalgia   Zocor [simvastatin - High Dose] Other (See Comments)   myalgia   Aripiprazole Other (See Comments)    Altered mental status   Effexor [venlafaxine Hydrochloride] Itching   Morphine John Related Itching      Medication List       Accurate as of August 17, 2018 10:47 AM. Always use your most recent med list.        acetaminophen 500 MG tablet Commonly known as:  TYLENOL Take 1,000 mg by mouth every 6 (six) hours as needed for moderate pain or headache.   ADVAIR DISKUS 500-50 MCG/DOSE Aepb Generic drug:  Fluticasone-Salmeterol Inhale 1 puff into the lungs 2 (two) times daily.   albuterol (5 MG/ML) 0.5% nebulizer solution Commonly known as:  PROVENTIL Take 2.5 mg by nebulization every 6 (six) hours as needed for wheezing or shortness of breath.   albuterol 108 (90 Base) MCG/ACT inhaler Commonly known as:  PROVENTIL HFA;VENTOLIN HFA Inhale 1 puff into the lungs every 6 (six) hours as needed for wheezing or shortness of breath.   albuterol (2.5 MG/3ML) 0.083% nebulizer solution Commonly known as:  PROVENTIL   clonazePAM 1 MG tablet Commonly known as:  KLONOPIN Take 1 mg by mouth 2 (two) times daily.   clopidogrel 75 MG tablet Commonly known as:  PLAVIX Take 1 tablet (75 mg total) by mouth daily.   DULoxetine 30 MG capsule Commonly known as:  CYMBALTA Take 30-60 mg by mouth See admin instructions. 60mg  (2 capsules) every morning John 30mg  (1 capsule) daily at evening   gabapentin 100 MG capsule Commonly known as:  NEURONTIN Take 100 mg by mouth at bedtime.   HYDROGEL Gel 1 g by Does not apply route daily.   nitroGLYCERIN 0.4 MG SL tablet Commonly known as:  NITROSTAT Place 1 tablet (0.4 mg total) under the tongue every 5 (five) minutes as needed for chest pain.   oxyCODONE 5 MG immediate release tablet Commonly known as:  Oxy IR/ROXICODONE Take 1 tablet (5 mg total) by mouth every 6 (six) hours as needed for moderate pain.   OXYGEN Inhale 2 L into the lungs See admin instructions. Uses 2L at bedtime John if needed throughout the day -- intolerant to CPAP     prazosin 1 MG capsule Commonly known as:  MINIPRESS Take 1 mg by mouth at bedtime.   QUEtiapine 300 MG 24 hr tablet Commonly known as:  SEROQUEL XR Take 300 mg by mouth at bedtime.   ramipril 2.5 MG capsule Commonly known as:  ALTACE Take 2.5 mg by mouth every morning.   SPIRIVA HANDIHALER 18 MCG inhalation capsule Generic drug:  tiotropium Place 18 mcg into inhaler John inhale daily.   traZODone 150 MG tablet Commonly known as:  DESYREL Take 0.5 tablets (75 mg total) by mouth at bedtime as needed for sleep.   Vitamin D3 25 MCG (1000 UT) Caps Take 1,000 Units by mouth every morning.   zolpidem 12.5 MG CR tablet Commonly known as:  AMBIEN CR Take 12.5 mg by mouth at bedtime.  Past Surgical History:  He  has a past surgical history that includes Femoral-tibial Bypass Graft (Right, 01/10/2014); left heart catheterization John Ball coronary/graft angiogram (N/A, 07/12/2014); Coronary artery bypass graft (07/27/2002); Cardiovascular stress test (last one  06-17-2014   dr  harding); Tonsillectomy; Femoral artery - popliteal artery bypass graft (Right, 10-24-2008   dr early); RIGHT ABOVE KNEE POPLITEAL GRAFT TO BELOW KNEE POPLITEAL ARTERY BYPASS John Ball REVERSE SAPHENOUS VEIN (03-02-2010); THROMBECTOMY OF RIGHT FEMORAL TO ABOVE KNEE POPLITEAL GORETEX GRAFT ANGIOPLASTY OF GORETEX John SAPHENOUS VEIN JUNCTION John ABOVE KNEE POPLITEAL ARTERY  (10-06-2010   DR EARLY); Video assisted thoracoscopy (vats)/wedge resection (Right, 05-01-2011    FORSYTH); Cardiac catheterization (07-13-2002); LEFT ANKLE SURGERY (1990's); Hydrocele surgery (Left); Eye surgery (Left, Nov 2013); Colonoscopy (2008    WNL); Transurethral resection of bladder tumor (N/A, 02/21/2015); Cystoscopy w/ ureteral stent placement (N/A, 02/21/2015); NM MYOVIEW LTD (05/2014); Lower Extremity Angiography (Bilateral, 11/12/2017); ABDOMINAL AORTOGRAM (N/A, 11/12/2017); Endarterectomy femoral (Left, 11/17/2017); Wound exploration (Left, 11/28/2017);  Application if wound vac (Left, 12/03/2017); ABDOMINAL AORTOGRAM W/LOWER EXTREMITY (Left, 02/10/2018); PERIPHERAL VASCULAR INTERVENTION (Left, 02/10/2018); John Femoral-popliteal Bypass Graft (Left, 02/11/2018).  Family History:  His family history includes Alzheimer's disease (age of onset: 69) in his father; Other (age of onset: 33) in his mother; Stroke in his father John mother.  Social History:  He  reports that he has been smoking cigarettes. He has smoked for the past 55.00 years. He has never used smokeless tobacco. He reports that he does not drink alcohol or use drugs.

## 2018-08-17 NOTE — Addendum Note (Signed)
Addended by: Suzzanne Cloud E on: 08/17/2018 10:56 AM   Modules accepted: Orders

## 2018-08-19 ENCOUNTER — Ambulatory Visit
Admission: RE | Admit: 2018-08-19 | Discharge: 2018-08-19 | Disposition: A | Discharge status: Home / Self Care | Payer: Medicare Other | Source: Ambulatory Visit | Attending: Pulmonary Disease | Admitting: Pulmonary Disease

## 2018-08-19 DIAGNOSIS — Z85118 Personal history of other malignant neoplasm of bronchus and lung: Secondary | ICD-10-CM

## 2018-08-19 DIAGNOSIS — R0609 Other forms of dyspnea: Secondary | ICD-10-CM

## 2018-08-19 DIAGNOSIS — R590 Localized enlarged lymph nodes: Secondary | ICD-10-CM

## 2018-08-19 DIAGNOSIS — R918 Other nonspecific abnormal finding of lung field: Secondary | ICD-10-CM

## 2018-08-19 DIAGNOSIS — C3490 Malignant neoplasm of unspecified part of unspecified bronchus or lung: Secondary | ICD-10-CM | POA: Diagnosis not present

## 2018-08-19 MED ORDER — IOPAMIDOL (ISOVUE-300) INJECTION 61%
75.0000 mL | Freq: Once | INTRAVENOUS | Status: AC | PRN
  Administered 2018-08-19: 75 mL via INTRAVENOUS

## 2018-09-03 ENCOUNTER — Telehealth: Payer: Self-pay | Admitting: Pulmonary Disease

## 2018-09-03 NOTE — Telephone Encounter (Signed)
CT chest 08/19/18 >> centrilobular/paraseptal emphysema, volume loss Rt hemithorax, decreased LAN   Please let him know that CT chest looked better.  Swelling of glands and in chest and areas of inflammation in the lungs has improved.  No change to current tx plan.  Will discuss in more detail at [facility] in March.

## 2018-09-04 NOTE — Telephone Encounter (Signed)
Spoke with patient's wife John Ball. She verbalized understanding about results. Nothing further needed at time of call.

## 2018-09-04 NOTE — Telephone Encounter (Signed)
Attempted to call patient today regarding results. I did not receive an answer at time of call. I have left a voicemail message for pt to return call. X1  

## 2018-09-15 ENCOUNTER — Encounter: Payer: Self-pay | Admitting: Pulmonary Disease

## 2018-09-15 ENCOUNTER — Ambulatory Visit (INDEPENDENT_AMBULATORY_CARE_PROVIDER_SITE_OTHER): Payer: Medicare Other | Admitting: Pulmonary Disease

## 2018-09-15 VITALS — BP 134/66 | HR 70 | Ht 72.0 in | Wt 187.6 lb

## 2018-09-15 DIAGNOSIS — J9611 Chronic respiratory failure with hypoxia: Secondary | ICD-10-CM | POA: Insufficient documentation

## 2018-09-15 DIAGNOSIS — J441 Chronic obstructive pulmonary disease with (acute) exacerbation: Secondary | ICD-10-CM

## 2018-09-15 DIAGNOSIS — J432 Centrilobular emphysema: Secondary | ICD-10-CM

## 2018-09-15 DIAGNOSIS — F17200 Nicotine dependence, unspecified, uncomplicated: Secondary | ICD-10-CM | POA: Diagnosis not present

## 2018-09-15 DIAGNOSIS — F17210 Nicotine dependence, cigarettes, uncomplicated: Secondary | ICD-10-CM

## 2018-09-15 MED ORDER — AZITHROMYCIN 250 MG PO TABS: ORAL_TABLET | ORAL | 0 refills | Status: DC

## 2018-09-15 MED ORDER — IPRATROPIUM-ALBUTEROL 0.5-2.5 (3) MG/3ML IN SOLN
3.0000 mL | Freq: Once | RESPIRATORY_TRACT | Status: AC
  Administered 2018-09-15: 3 mL via RESPIRATORY_TRACT

## 2018-09-15 MED ORDER — PREDNISONE 10 MG PO TABS: ORAL_TABLET | ORAL | 0 refills | Status: DC

## 2018-09-15 NOTE — Assessment & Plan Note (Addendum)
Assessment: Maintained on 2 L via nasal cannula 24/7  Plan: Start 3 L via nasal cannula with physical exertion Continue 2 L via nasal cannula at rest DME order placed for John Ball to see if the patient can get a pulse oximeter to check personal oxygen levels at home >>> Addendum: Patient will need to pay for this out of pocket

## 2018-09-15 NOTE — Progress Notes (Signed)
@Patient  ID: John Ball, male    DOB: 1941-04-19, 78 y.o.   MRN: [id number]  Chief Complaint  Patient presents with  . Follow-up    COPD 2 week follow up     Referring provider: Mellody Dance, DO  HPI:  78 year old male current every day smoker followed in our office for COPD and Chronic Respiratory Failure with 2L via [place] 24/7  PMH: Status post CABG x4, hypertension, bipolar, depression, diastolic heart failure, history of wound infection with MRSA Smoker/ Smoking History: Current Everyday Smoker. 0.75 ppd. 90 pack year smoker.  Maintenance:  Advair 500, Spiriva 72mcg Pt of: Dr. Halford Chessman   09/15/2018  - Visit   78 year old male current every day smoker presenting to our office today for a two-week follow-up visit.  Patient was last seen by Dr. Halford Chessman on 08/17/2018.  Patient completed a CT of his chest on 08/19/2018 which revealed centrilobular and paraseptal emphysema, but improved mediastinal adenopathy.  No other changes in plan of care were made after CT chest was completed.  Patient reports her office today and when ambulated to exam room on 2 L patient dropped to 86%.  When transition to 3 L with exertion he was able to maintain greater than 91%.  Patient reports that for the last week he is had increased shortness of breath, fatigue, occasional wheezing, increased sputum production.  Patient reports that he has not had a sputum color change it is still thick white which is his baseline sputum color.  Patient does emphasize though that he is having much more sputum production.  Patient has using his rescue 2-3 times daily which is also been increased over the last week.  Patient spouse is concerned the patient has lost weight over the past 3 months without trying.  Per chart review patient's weight has been stable over the last 7 months.   MMRC - Breathlessness Score 4 - I am too breathless to leave the house or I am breathlessness when dressing    Tests:   CT chest 04/20/15 >>  atherosclerosis, mild centrilobular/paraseptal emphysema, s/p RLL ectomy Spirometry 03/05/16 >> FEV1 1.21 (36%), FEV1% 54% ABG 03/07/16 >> pH 7.40, PaCO2 42.6, PaO2 59.3 CT angio chest 09/12/17 >> thickened Rt diaphragm, s/p RLLectomy, tree in bud CT chest 08/19/18 >> centrilobular/paraseptal emphysema, volume loss Rt hemithorax, decreased LAN  Echo 09/26/17 >> EF 50 to 55%, grade 1 DD, PAS 36 mmHg  FENO:  No results found for: NITRICOXIDE  PFT: PFT Results Latest Ref Rng & Units 02/25/2014  FVC-Pre L 3.08  FVC-Predicted Pre % 70  Pre FEV1/FVC % % 59  FEV1-Pre L 1.83  FEV1-Predicted Pre % 56  DLCO UNC% % 38  DLCO COR %Predicted % 56    Imaging: Ct Chest W Contrast  Result Date: 08/19/2018 CLINICAL DATA:  Lung cancer.  Status post right lower lobectomy. EXAM: CT CHEST WITH CONTRAST TECHNIQUE: Multidetector CT imaging of the chest was performed during intravenous contrast administration. CONTRAST:  25mL ISOVUE-300 IOPAMIDOL (ISOVUE-300) INJECTION 61% COMPARISON:  09/12/2017 FINDINGS: Cardiovascular: The heart size is normal. No substantial pericardial effusion. Coronary artery calcification is evident. Atherosclerotic calcification is noted in the wall of the thoracic aorta. Status post CABG. Mediastinum/Nodes: 2.7 x 2.2 cm right hilar lymph node measured previously is 2.2 x 2.1 cm today. Upper normal left hilar lymph nodes are similar. The esophagus has normal imaging features. There is no axillary lymphadenopathy. Lungs/Pleura: The central tracheobronchial airways are patent. Centrilobular and paraseptal emphysema noted. Volume  loss right hemithorax is compatible with prior right lower lobectomy. No suspicious pulmonary nodule or mass. No focal airspace consolidation. No pleural effusion. Upper Abdomen: Unremarkable. Musculoskeletal: No worrisome lytic or sclerotic osseous abnormality. IMPRESSION: 1. Interval decrease in right hilar lymphadenopathy. 2. Interval resolution of the right greater than  left diffuse tree-in-bud nodularity seen on previous study. 3.  Emphysema. (ICD10-J43.9) 4.  Aortic Atherosclerois (ICD10-170.0) Electronically Signed   By: Misty Stanley M.D.   On: 08/19/2018 09:32      Specialty Problems      Pulmonary Problems   Squamous cell lung cancer ([place])   Lung cancer (HCC)    squamous cell lung cancer (stage IA, s/p of right lobectomy, no further treatment needed)        COPD (chronic obstructive pulmonary disease) (HCC)   Acute on chronic respiratory failure with hypoxia (HCC)   COPD exacerbation (HCC)   OSA (obstructive sleep apnea)    uses O2  via [place]  --  intolerate to CPAP      SOB (shortness of breath)   CAP (community acquired pneumonia)    Requiring hospitalization 09/2017 x 1 day CXR 09/22/2017 shows resolution  COPD exacerbation/CAP/chronic hypoxic respiratory failure on home o2 2liter 24/7 -he does has history of lung cancer -He presented with tachycardia, tachypnea and wheezing. -CTA chest  1. No acute pulmonary embolism. 2. RIGHT greater than LEFT tree-in-bud infiltrates likely infectious, less likely inflammatory with RIGHT middle lobe atelectasis versus early pneumonia. 3. Status post RIGHT lower lobectomy. Increasing RIGHT hilar lymphadenopathy, potentially reactive though, recurrent neoplasm not excluded. Recommend close attention on follow-up imaging  - urine strep pneumo antigen negative. Flu swab negative. mrsa screening negative.  -Sputum culture in process, spiratory viral panel in process, blood culture in process -he received rocephin and zithro, steroids  in the hospital -Tachycardia has resolved ,wheezing resolved, No hypoxia on 2 L.  I have advised him to stay in hospital for 1 more day, especially culture results still pending and he remained tachypneic even at rest. he is adamant about going home.  He stated that he does not at home as well . his wife feels she can take care of the patient at home as well. -he is  discharged on augmentin and steroids taper, close follow up with pmd and pulmonology. -He reports currently smoke half a pack a day,declined nicotine patch. He is not ready to quit smoking.        Chronic respiratory failure with hypoxia (HCC)      Allergies  Allergen Reactions  . Aspirin Other (See Comments)    Avoids due to severe bruises  . Crestor [Rosuvastatin Calcium] Other (See Comments)    myalgia  . Lipitor [Atorvastatin Calcium] Other (See Comments)    myalgia  . Zetia [Ezetimibe] Other (See Comments)    myalgia  . Zocor [Simvastatin - High Dose] Other (See Comments)    myalgia  . Aripiprazole Other (See Comments)    Altered mental status  . Effexor [Venlafaxine Hydrochloride] Itching  . Morphine And Related Itching    Immunization History  Administered Date(s) Administered  . Influenza Split 03/14/2017, 03/17/2018  . Influenza,inj,Quad PF,6+ Mos 03/05/2016  . Meningococcal Polysaccharide 03/12/2017  . Pneumococcal Conjugate-13 04/29/2014  . Pneumococcal Polysaccharide-23 03/12/2017  . Tdap 10/30/2006    Past Medical History:  Diagnosis Date  . Anxiety   . Arthritis   . Complication of anesthesia    hx prolonged post op oxygen dependent/  hx hallucinations for 2  day post op lobectomy 2012  . COPD, severe ([place])   . Depression   . Essential hypertension   . Hearing loss of both ears    does wear his hearing aids  . History of arterial bypass of lower extremity    RIGHT FEM-POP  . History of CHF (congestive heart failure)   . History of DVT of lower extremity   . History of panic attacks   . Hyperlipidemia with target LDL less than 70   . Left main coronary artery disease 2004   a) Severe LM CAD 2004 -->s/p  cabg x4 (LIMA-LAD, free RIMA-RI, SVG-OM 2, SVG-RPDA); b) 06/2014: Abnormal Myoview --> c)cardiac Cath Jan 2016: pLM 70%, pLAD 40-50%- patent LIMA-LAD.  RI -competitive flow noted from RIMA-RI graft, circumflex, normal caliber with moderate OM1.  OM  2 occluded.  SVG-OM 2 patent. RCA CTO after large RV M, faint R-R and LAD septal-PDA collaterals =--> new  . Neuropathy, peripheral   . 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 Nocturia   . OSA (obstructive sleep apnea)    uses O2  via [place]  --  intolerate to CPAP  . Peripheral arterial occlusive disease ([place])    2011   a) Gore-Tex graft right AK popA-BK popA --> b) 2012: thrombosed graft --> thrombectomy with dacron patch = 1 V runnof via [place]; c) 01/2014: R femoropopliteal bypass with left femoral vein;; followed by dr Early--  per last dopplers 06-28-2014 no change right graft,  50-74% stenosis common and left mid superficial femoral artery  . PTSD (post-traumatic stress disorder)    anxiety attack's---  [place] V[facility]  . Recurrent productive cough    SMOKER'S COUGH  . 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  Medical management  . Smoker unmotivated to quit    Reportedly he came close to having a nervous breakdown when he last tried to quit  . Squamous cell lung cancer ([place]) 2012-2014   Stage IA  Non-small cell--- a) 04/2011: R L Lobectomy & med node dissection ([id number]) - [facility] [place], [place]) w/o post-op Rx; b) CT-A Chest Jan 2013: R pl effusion, R hilar LAN (3.3 cm x 2.8 cm & 2.2 cm x 1.3 cm); c) 2/'13 PET-CT Chest: Bilat Hilar LAN, no distant Mets  d) CT chest 9/'14: Stable shotty hilar nodes bilaterally w/ no pathologic sized LAD or suspicious Pulm nodule; ;;  (oncologist at  . Wears glasses     Tobacco History: Social History   Tobacco Use  Smoking Status Current Every Day Smoker  . Packs/day: 1.50  . Years: 60.00  . Pack years: 90.00  . Types: Cigarettes  . Start date: 09/14/1957  Smokeless Tobacco Never Used  Tobacco Comment   3/4 pk per day 3/10   Ready to quit: Not Answered Counseling given: Yes Comment:  3/4 pk per day 3/10  Smoking assessment and cessation counseling  Patient currently smoking: 0.75 ppd I have advised the patient to quit/stop smoking as soon as possible due to high risk for multiple medical problems.  It will also be very difficult for [place] to manage patient's  respiratory symptoms and status if we continue to expose her lungs to a known irritant.  We do not advise e-cigarettes as a form of stopping smoking.  Patient is not  willing to quit smoking.  I have advised the patient that we can assist and have options of nicotine replacement therapy, provided smoking cessation education today, provided smoking cessation counseling, and provided cessation resources.  Follow-up next office visit office visit for assessment of smoking cessation.   Smoking cessation counseling advised for: 4 min     Outpatient Encounter Medications as of 09/15/2018  Medication Sig  . acetaminophen (TYLENOL) 500 MG tablet Take 1,000 mg by mouth every 6 (six) hours as needed for moderate pain or headache.   . ADVAIR DISKUS 500-50 MCG/DOSE AEPB Inhale 1 puff into the lungs 2 (two) times daily.   Marland Kitchen albuterol (PROVENTIL HFA;VENTOLIN HFA) 108 (90 BASE) MCG/ACT inhaler Inhale 1 puff into the lungs every 6 (six) hours as needed for wheezing or shortness of breath.  Marland Kitchen albuterol (PROVENTIL) (2.5 MG/3ML) 0.083% nebulizer solution   . albuterol (PROVENTIL) (5 MG/ML) 0.5% nebulizer solution Take 2.5 mg by nebulization every 6 (six) hours as needed for wheezing or shortness of breath.  . Carbomer Gel Base (HYDROGEL) GEL 1 g by Does not apply route daily.  . Cholecalciferol (VITAMIN D3) 1000 UNITS CAPS Take 1,000 Units by mouth every morning.   . clonazePAM (KLONOPIN) 1 MG tablet Take 1 mg by mouth 2 (two) times daily.   . DULoxetine (CYMBALTA) 30 MG capsule Take 30-60 mg by mouth See admin instructions. 60mg  (2 capsules) every morning and 30mg  (1 capsule) daily at evening  . gabapentin (NEURONTIN) 100 MG capsule  Take 100 mg by mouth at bedtime.   . nitroGLYCERIN (NITROSTAT) 0.4 MG SL tablet Place 1 tablet (0.4 mg total) under the tongue every 5 (five) minutes as needed for chest pain.  Marland Kitchen oxyCODONE (OXY IR/ROXICODONE) 5 MG immediate release tablet Take 1 tablet (5 mg total) by mouth every 6 (six) hours as needed for moderate pain.  . OXYGEN Inhale 2 L into the lungs See admin instructions. Uses 2L at bedtime and if needed throughout the day -- intolerant to CPAP  . prazosin (MINIPRESS) 1 MG capsule Take 1 mg by mouth at bedtime.  Marland Kitchen QUEtiapine (SEROQUEL XR) 300 MG 24 hr tablet Take 300 mg by mouth at bedtime.   . ramipril (ALTACE) 2.5 MG capsule Take 2.5 mg by mouth every morning.   . tiotropium (SPIRIVA HANDIHALER) 18 MCG inhalation capsule Place 18 mcg into inhaler and inhale daily.  . traZODone (DESYREL) 150 MG tablet Take 0.5 tablets (75 mg total) by mouth at bedtime as needed for sleep. (Patient taking differently: Take 150 mg by mouth at bedtime. )  . zolpidem (AMBIEN CR) 12.5 MG CR tablet Take 12.5 mg by mouth at bedtime.  Marland Kitchen azithromycin (ZITHROMAX) 250 MG tablet 500mg  (two tablets) today, then 250mg  (1 tablet) for the next 4 days  . clopidogrel (PLAVIX) 75 MG tablet Take 1 tablet (75 mg total) by mouth daily. (Patient not taking: Reported on 09/15/2018)  . predniSONE (DELTASONE) 10 MG tablet 4 tabs for 2 days, then 3 tabs for 2 days, 2 tabs for 2 days, then 1 tab for 2 days, then stop  . [EXPIRED] ipratropium-albuterol (DUONEB) 0.5-2.5 (3) MG/3ML nebulizer solution 3 mL    No facility-administered encounter medications on file as of 09/15/2018.      Review of Systems  Review of Systems  Constitutional: Positive for fatigue. Negative for activity change, chills, fever and unexpected weight change.  HENT: Positive for congestion. Negative for postnasal drip, rhinorrhea, sinus pressure and sinus pain.   Eyes: Negative.  Respiratory: Positive for cough (prod cough with white mucous), chest  tightness and shortness of breath. Negative for wheezing.   Cardiovascular: Positive for chest pain and palpitations.  Gastrointestinal: Negative for diarrhea, nausea and vomiting.  Endocrine: Negative.   Musculoskeletal: Negative.   Skin: Negative.   Neurological: Negative for dizziness and headaches.  Psychiatric/Behavioral: Negative.  Negative for dysphoric mood. The patient is not nervous/anxious.   All other systems reviewed and are negative.    Physical Exam  BP 134/66 (BP Location: Left Arm, Cuff Size: Normal)   Pulse 70   Ht 6' (1.829 m)   Wt 187 lb 9.6 oz (85.1 kg)   SpO2 96%   BMI 25.44 kg/m   Wt Readings from Last 5 Encounters:  09/15/18 187 lb 9.6 oz (85.1 kg)  08/17/18 189 lb (85.7 kg)  04/07/18 190 lb (86.2 kg)  03/13/18 193 lb 1.6 oz (87.6 kg)  02/20/18 190 lb 4.8 oz (86.3 kg)     Physical Exam  Constitutional: He is oriented to person, place, and time and well-developed, well-nourished, and in no distress. No distress.  HENT:  Head: Normocephalic and atraumatic.  Right Ear: Hearing, tympanic membrane, external ear and ear canal normal.  Left Ear: Hearing, tympanic membrane, external ear and ear canal normal.  Nose: Mucosal edema and rhinorrhea present. Right sinus exhibits no maxillary sinus tenderness and no frontal sinus tenderness. Left sinus exhibits no maxillary sinus tenderness and no frontal sinus tenderness.  Mouth/Throat: Uvula is midline and oropharynx is clear and moist. No oropharyngeal exudate.  Eyes: Pupils are equal, round, and reactive to light.  Neck: Normal range of motion. Neck supple.  Cardiovascular: Normal rate, regular rhythm and normal heart sounds.  Pulmonary/Chest: Effort normal. No accessory muscle usage. No respiratory distress. He has no decreased breath sounds. He has wheezes (Inspiratory and expiratory wheeze). He has no rhonchi.  Abdominal: Soft. Bowel sounds are normal. There is no abdominal tenderness.  Musculoskeletal:  Normal range of motion.        General: No edema.  Lymphadenopathy:    He has no cervical adenopathy.  Neurological: He is alert and oriented to person, place, and time. Gait normal.  Skin: Skin is warm and dry. He is not diaphoretic. No erythema.  Psychiatric: Mood, memory, affect and judgment normal.  Nursing note and vitals reviewed.     Lab Results:  CBC    Component Value Date/Time   WBC 5.8 02/12/2018 0450   RBC 3.40 (L) 02/12/2018 0450   HGB 11.0 (L) 02/12/2018 0450   HGB 14.6 09/25/2017 1420   HGB 14.4 05/02/2014 1156   HCT 35.3 (L) 02/12/2018 0450   HCT 43.0 09/25/2017 1420   HCT 43.9 05/02/2014 1156   PLT 129 (L) 02/12/2018 0450   PLT 228 09/25/2017 1420   MCV 103.8 (H) 02/12/2018 0450   MCV 98 (H) 09/25/2017 1420   MCV 96.1 05/02/2014 1156   MCH 32.4 02/12/2018 0450   MCHC 31.2 02/12/2018 0450   RDW 12.7 02/12/2018 0450   RDW 14.6 09/25/2017 1420   RDW 14.1 05/02/2014 1156   LYMPHSABS 1.7 11/30/2017 0902   LYMPHSABS 1.6 05/02/2014 1156   MONOABS 0.3 11/30/2017 0902   MONOABS 0.4 05/02/2014 1156   EOSABS 0.1 11/30/2017 0902   EOSABS 0.2 05/02/2014 1156   BASOSABS 0.0 11/30/2017 0902   BASOSABS 0.0 05/02/2014 1156    BMET    Component Value Date/Time   NA 140 08/17/2018 1056   NA 143 09/25/2017 1420  NA 139 05/02/2014 1156   K 4.4 08/17/2018 1056   K 4.1 05/02/2014 1156   CL 102 08/17/2018 1056   CO2 30 08/17/2018 1056   CO2 24 05/02/2014 1156   GLUCOSE 97 08/17/2018 1056   GLUCOSE 101 05/02/2014 1156   BUN 22 08/17/2018 1056   BUN 18 09/25/2017 1420   BUN 15.7 05/02/2014 1156   CREATININE 1.00 08/17/2018 1056   CREATININE 1.21 07/07/2014 1105   CREATININE 1.1 05/02/2014 1156   CALCIUM 9.7 08/17/2018 1056   CALCIUM 9.5 05/02/2014 1156   GFRNONAA >60 12/03/2017 0417   GFRAA >60 12/03/2017 0417    BNP    Component Value Date/Time   BNP 73.2 09/12/2017 1500    ProBNP No results found for: PROBNP    Assessment & Plan:    COPD  exacerbation (HCC) Assessment: Inspiratory and expiratory wheezes on exam today mMRC 4 Patient reports adherence to controller therapies Patient using rescue inhaler 2-3 times daily  Plan: DuoNeb in office today >>> Resolved inspiratory and improved expiratory wheeze after DuoNeb Prednisone taper Z-Pak  COPD (chronic obstructive pulmonary disease) (HCC) Assessment: February/2020 CT chest shows centrilobular and paraseptal emphysema mMRC 4 today Patient reports adherence to Advair as well as Spiriva HandiHaler Using rescue inhaler 2-3 times daily Inspiratory and expiratory wheezes on exam today  Plan: DuoNeb >>> Resolved inspiratory wheeze and improved expiratory wheeze after DuoNeb Prednisone today Z-Pak Continue oxygen therapy as prescribed Continue Advair Continue Spiriva HandiHaler Continue rescue inhaler Follow-up with our office in 2 to 4 weeks to ensure improved symptoms    Chronic respiratory failure with hypoxia (HCC) Assessment: Maintained on 2 L via nasal cannula 24/7  Plan: Start 3 L via nasal cannula with physical exertion Continue 2 L via nasal cannula at rest DME order placed for [facility] to see if the patient can get a pulse oximeter to check personal oxygen levels at home >>> Addendum: Patient will need to pay for this out of pocket  Smoker unmotivated to quit Assessment: Currently smoking 0.75 packs/day Patient still not interested in stopping smoking Patient has not set a quit date  Plan: We discussed smoking sensation today, patient continues to not want to quit We discussed the fact that this will further increase his oxygen needs as well as reduce his lung functioning Patient knows to contact our office if he does decide to stop smoking so that we can help support him     Lauraine Rinne, NP 09/15/2018   This appointment was 32 min long with over 50% of the time in direct face-to-face patient care, assessment, plan of care, and  follow-up.

## 2018-09-15 NOTE — Assessment & Plan Note (Addendum)
Assessment: February/2020 CT chest shows centrilobular and paraseptal emphysema mMRC 4 today Patient reports adherence to Advair as well as Spiriva HandiHaler Using rescue inhaler 2-3 times daily Inspiratory and expiratory wheezes on exam today  Plan: DuoNeb >>> Resolved inspiratory wheeze and improved expiratory wheeze after DuoNeb Prednisone today Z-Pak Continue oxygen therapy as prescribed Continue Advair Continue Spiriva HandiHaler Continue rescue inhaler Follow-up with our office in 2 to 4 weeks to ensure improved symptoms

## 2018-09-15 NOTE — Assessment & Plan Note (Signed)
Assessment: Currently smoking 0.75 packs/day Patient still not interested in stopping smoking Patient has not set a quit date  Plan: We discussed smoking sensation today, patient continues to not want to quit We discussed the fact that this will further increase his oxygen needs as well as reduce his lung functioning Patient knows to contact our office if he does decide to stop smoking so that we can help support him

## 2018-09-15 NOTE — Patient Instructions (Addendum)
Duoneb today   Prednisone 10mg  tablet  >>>4 tabs for 2 days, then 3 tabs for 2 days, 2 tabs for 2 days, then 1 tab for 2 days, then stop >>>take with food  >>>take in the morning   Azithromycin 250mg  tablet  >>>Take 2 tablets (500mg  total) today, and then 1 tablet (250mg ) for the next four days  >>>take with food  >>>can also take probiotic and / or yogurt while on antibiotic   Continue Spiriva Handihaler as prescribed  Continue Advair daily   Continue oxygen therapy as prescribed  - 3L with exertion, and 2L at rest >>>maintain oxygen saturations greater than 88 percent  >>>if unable to maintain oxygen saturations please contact the office  >>>do not smoke with oxygen  >>>can use nasal saline gel or nasal saline rinses to moisturize nose if oxygen causes dryness  Note your daily symptoms > remember "red flags" for COPD:   >>>Increase in cough >>>increase in sputum production >>>increase in shortness of breath or activity  intolerance.   If you notice these symptoms, please call the office to be seen.   Referral placed to DME at [facility] to see if patient can get personal pulse oximeter to monitor oxygen saturations >>> [facility] let [facility] know that you need help with this  [facility]  If symptoms do not improve or shortness of breath worsens please proceed to the emergency department for further evaluation  Follow-up with our office in 2 to 4 weeks to ensure your breathing has improved >>> Follow-up with Dr. Halford Chessman or Wyn Quaker, FNP   It is flu season:   >>> Best ways to protect herself from the flu: Receive the yearly flu vaccine, practice good hand hygiene washing with soap and also using hand sanitizer when available, eat a nutritious meals, get adequate rest, hydrate appropriately   Please contact the office if your symptoms worsen or you have concerns that you are not improving.   Thank you for choosing [facility] for your healthcare, and for allowing [place] to partner with you on your healthcare journey. I am thankful to be able to provide care to you today.   Wyn Quaker FNP-C    Chronic Obstructive Pulmonary Disease Exacerbation Chronic obstructive pulmonary disease (COPD) is a long-term (chronic) lung problem. In COPD, the flow of air from the lungs is limited. COPD exacerbations are times that breathing gets worse and you need more than your normal treatment. Without treatment, they can be life threatening. If they happen often, your lungs can become more damaged. If your COPD gets worse, your doctor may treat you with:  Medicines.  Oxygen.  Different ways to clear your airway, such as using a mask. Follow these instructions at home: Medicines  Take over-the-counter and prescription medicines only as told by your doctor.  If you take an antibiotic or steroid medicine, do not stop taking the medicine even if you start to feel better.  Keep up with shots (vaccinations) as told by your doctor. Be sure to get a yearly (annual) flu shot. Lifestyle  Do not smoke. If you need help quitting, ask your doctor.  Eat healthy foods.  Exercise regularly.  Get plenty of sleep.  Avoid tobacco smoke and other things that can bother your lungs.  Wash your hands often with soap and water. This will help keep you from getting an infection. If you cannot use soap and water, use hand sanitizer.  During flu season, avoid areas that are crowded with people. General instructions  Drink enough fluid to keep your pee (urine) clear or pale yellow. Do not do this if your doctor has told you not to.  Use a cool mist machine (vaporizer).  If you use oxygen or a machine that turns medicine into a mist (nebulizer), continue to use it as told.  Follow all instructions for rehabilitation. These are steps you can take to make your body work better.  Keep all follow-up visits as told by your doctor.  This is important. Contact a doctor if:  Your COPD symptoms get worse than normal. Get help right away if:  You are short of breath and it gets worse.  You have trouble talking.  You have chest pain.  You cough up blood.  You have a fever.  You keep throwing up (vomiting).  You feel weak or you pass out (faint).  You feel confused.  You are not able to sleep because of your symptoms.  You are not able to do daily activities. Summary  COPD exacerbations are times that breathing gets worse and you need more treatment than normal.  COPD exacerbations can be very serious and may cause your lungs to become more damaged.  Do not smoke. If you need help quitting, ask your doctor.  Stay up-to-date on your shots. Get a flu shot every year. This information is not intended to replace advice given to you by your health care provider. Make sure you discuss any questions you have with your health care provider. Document Released: 06/13/2011 Document Revised: 07/29/2016 Document Reviewed: 07/29/2016 Elsevier Interactive Patient Education  2019 [employer].

## 2018-09-15 NOTE — Assessment & Plan Note (Addendum)
Assessment: Inspiratory and expiratory wheezes on exam today mMRC 4 Patient reports adherence to controller therapies Patient using rescue inhaler 2-3 times daily  Plan: DuoNeb in office today >>> Resolved inspiratory and improved expiratory wheeze after DuoNeb Prednisone taper Z-Pak

## 2018-09-15 NOTE — Progress Notes (Signed)
Reviewed and agree with assessment/plan.   Geoge Lawrance, MD [facility] 07/03/2016, 12:24 PM Pager:  [phone]  

## 2018-09-21 ENCOUNTER — Telehealth: Payer: Self-pay | Admitting: Pulmonary Disease

## 2018-09-21 MED ORDER — AMOXICILLIN-POT CLAVULANATE 875-125 MG PO TABS: 1.0000 | ORAL_TABLET | Freq: Two times a day (BID) | ORAL | 0 refills | Status: DC

## 2018-09-21 MED ORDER — PREDNISONE 10 MG PO TABS: 20.0000 mg | ORAL_TABLET | Freq: Every day | ORAL | 0 refills | Status: DC

## 2018-09-21 NOTE — Telephone Encounter (Signed)
Sorry to hear the patient is not feeling well.  If the patient's not wheezing we can hold off on extended prednisone at this time.  Continue medications as prescribed.  Can offer:  Augmentin >>> Take 1 875-125 mg tablet every 12 hours for the next 7 days >>> Take with food  Prednisone 10mg  tablet  >>>Take 2 tablets (20 mg total) daily for the next 5 days >>> Take with food in the morning  Please place the orders  If symptoms continue to not improve then he may need to present to an emergency room for further evaluation.  We will try to hold off on this with empiric antibiotic coverage listed above.  If patient develops a fever, starts have increased productive cough, or becomes progressively more short of breath and wheezing despite antibiotic coverage and current medications that he will need to present to an emergency room, and will need at least a chest x-ray.  Please emphasized with the patient that he needs to stop smoking.  This makes it very difficult for [place] to manage his exacerbations.  Wyn Quaker, FNP

## 2018-09-21 NOTE — Telephone Encounter (Signed)
Coronavirus (COVID-19) Are you at risk?  Are you at risk for the Coronavirus (COVID-19)?  To be considered HIGH RISK for Coronavirus (COVID-19), you have to meet the following criteria:  . Traveled to [place], [place], [place], [place] or [place]; or in the [place] to [place], [place], [place], or [place]; and have fever, cough, and shortness of breath within the last 2 weeks of travel OR . Been in close contact with a person diagnosed with COVID-19 within the last 2 weeks and have fever, cough, and shortness of breath . IF YOU DO NOT MEET THESE CRITERIA, YOU ARE CONSIDERED LOW RISK FOR COVID-19.  What to do if you are HIGH RISK for COVID-19?  . If you are having a medical emergency, call 911. . Seek medical care right away. Before you go to a doctor's office, urgent care or emergency department, call ahead and tell them about your recent travel, contact with someone diagnosed with COVID-19, and your symptoms. You should receive instructions from your physician's office regarding next steps of care.  . When you arrive at healthcare provider, tell the healthcare staff immediately you have returned from visiting [place], [place], [place], [place] or [place]; or traveled in the [place] to [place], [place], [place], or [place]; in the last two weeks or you have been in close contact with a person diagnosed with COVID-19 in the last 2 weeks.   . Tell the health care staff about your symptoms: fever, cough and shortness of breath. . After you have been seen by a medical provider, you will be either: o Tested for (COVID-19) and discharged home on quarantine except to seek medical care if symptoms worsen, and asked to  - Stay home and avoid contact with others until you get your results (4-5 days)  - Avoid travel on public transportation if possible (such as bus, train, or airplane) or o Sent to the Emergency Department by EMS for evaluation, COVID-19 testing, and possible  admission depending on your condition and test results.  What to do if you are LOW RISK for COVID-19?  Reduce your risk of any infection by using the same precautions used for avoiding the common cold or flu:  . Wash your hands often with soap and warm water for at least 20 seconds.  If soap and water are not readily available, use an alcohol-based hand sanitizer with at least 60% alcohol.  . If coughing or sneezing, cover your mouth and nose by coughing or sneezing into the elbow areas of your shirt or coat, into a tissue or into your sleeve (not your hands). . Avoid shaking hands with others and consider head nods or verbal greetings only. . Avoid touching your eyes, nose, or mouth with unwashed hands.  . Avoid close contact with people who are sick. . Avoid places or events with large numbers of people in one location, like concerts or sporting events. . Carefully consider travel plans you have or are making. . If you are planning any travel outside or inside the [place], visit the CDC's Travelers' Health webpage for the latest health notices. . If you have some symptoms but not all symptoms, continue to monitor at home and seek medical attention if your symptoms worsen. . If you are having a medical emergency, call 911.   ADDITIONAL HEALTHCARE OPTIONS FOR PATIENTS  [facility] / e-Visit: [email or web address]         [facility]: [phone]  [facility]: [phone]                   [facility]: [phone]   I reviewed the Covid 19 information with the pt's spouse [name]  I reviewed Brian's recs with the pt's spouse Rxs were sent to pharm

## 2018-09-21 NOTE — Telephone Encounter (Signed)
Primary Pulmonologist: Dr. Halford Chessman Last office visit and with whom: 09/15/18 with Wyn Quaker What do we see them for (pulmonary problems): centrilobular emphysema and chronic resp. failure Last OV assessment/plan: Duoneb today   Prednisone 10mg  tablet  >>>4 tabs for 2 days, then 3 tabs for 2 days, 2 tabs for 2 days, then 1 tab for 2 days, then stop >>>take with food  >>>take in the morning   Azithromycin 250mg  tablet  >>>Take 2 tablets (500mg  total) today, and then 1 tablet (250mg ) for the next four days  >>>take with food  >>>can also take probiotic and / or yogurt while on antibiotic   Continue Spiriva Handihaler as prescribed  Continue Advair daily   Continue oxygen therapy as prescribed  - 3L with exertion, and 2L at rest >>>maintain oxygen saturations greater than 88 percent  >>>if unable to maintain oxygen saturations please contact the office  >>>do not smoke with oxygen  >>>can use nasal saline gel or nasal saline rinses to moisturize nose if oxygen causes dryness  Note your daily symptoms >remember "red flags" for COPD:  >>>Increase in cough >>>increase in sputum production >>>increase in shortness of breath or activity  intolerance.   If you notice these symptoms, please call the office to be seen.   Referral placed to DME at [facility] to see if patient can get personal pulse oximeter to monitor oxygen saturations >>> Please [facility] with [facility] to let them know that you need help with this  [facility]  If symptoms do not improve or shortness of breath worsens please proceed to the emergency department for further evaluation  Follow-up with our office in 2 to 4 weeks to ensure your breathing has improved >>> Follow-up with Dr. Halford Chessman or Wyn Quaker, FNP  Was appointment offered to patient (explain)?  No, due to not offering sick appts due to coronavirus   Reason for call: Spoke with patient's wife. Patient was seen by Aaron Edelman  on 09/15/18. He was prescribed a prednisone taper and zpak. He is still not feeling any better. He does not have an appetite, no energy. Still SOB with 2L on O2. Denied having a cough, fever or chills. He is only taking OTC Tylenol as needed.   TP, please advise. Thanks!

## 2018-09-21 NOTE — Telephone Encounter (Signed)
Please also review:   Coronavirus (COVID-19) Are you at risk?  Are you at risk for the Coronavirus (COVID-19)?  To be considered HIGH RISK for Coronavirus (COVID-19), you have to meet the following criteria:  Traveled to [place], [place], [place], [place] or [place]; or in the [place] to [place], [place], [place], or [place]; and have fever, cough, and shortness of breath within the last 2 weeks of travel OR Been in close contact with a person diagnosed with COVID-19 within the last 2 weeks and have fever, cough, and shortness of breath IF YOU DO NOT MEET THESE CRITERIA, YOU ARE CONSIDERED LOW RISK FOR COVID-19.  What to do if you are HIGH RISK for COVID-19?  If you are having a medical emergency, call 911. Seek medical care right away. Before you go to a doctor's office, urgent care or emergency department, call ahead and tell them about your recent travel, contact with someone diagnosed with COVID-19, and your symptoms. You should receive instructions from your physician's office regarding next steps of care.  When you arrive at healthcare provider, tell the healthcare staff immediately you have returned from visiting [place], [place], [place], [place] or [place]; or traveled in the [place] to [place], [place], [place], or [place]; in the last two weeks or you have been in close contact with a person diagnosed with COVID-19 in the last 2 weeks. Tell the health care staff about your symptoms: fever, cough and shortness of breath. After you have been seen by a medical provider, you will be either: Tested for (COVID-19) and discharged home on quarantine except to seek medical care if symptoms worsen, and asked to  Stay home and avoid contact with others until you get your results (4-5 days) Avoid travel on public transportation if possible (such as bus, train,or airplane) or Sent to the Emergency Department by EMS for evaluation, COVID-19 testing, and possible  admission depending on your condition and test results.  What to do if you are LOW RISK for COVID-19?  Reduce your risk of any infection by using the same precautions used for avoiding the common cold or flu: Wash your hands often with soap and warm water for at least 20 seconds.  If soap and water are not readily available, use an alcohol-based hand sanitizer with at least 60% alcohol.  If coughing or sneezing, cover your mouth and nose by coughing or sneezing into the elbow areas of your shirt or coat, into a tissue or into your sleeve (not your hands). Avoid shaking hands with others and consider head nods or verbal greetings only.  Avoid touching your eyes,nose, or mouth with unwashed hands. Avoid close contact with people who are sick. Avoid places or events with large numbers of people in one location, like concerts or sporting events. Carefully consider travel plans you have or are making. If you are planning any travel outside or inside the [place], visit the CDC'sTravelers' Health webpagefor the latest health notices. If you have some symptoms but not all symptoms, continue to monitor at home and seek medical attention if your symptoms worsen. If you are having a medical emergency, call 911.   [place] / e-Visit: [email or web address]  [facility]: [facility]: [phone]           [facility]: [phone]

## 2018-10-13 ENCOUNTER — Ambulatory Visit: Payer: Medicare Other | Admitting: Pulmonary Disease

## 2018-10-15 ENCOUNTER — Other Ambulatory Visit: Payer: Self-pay | Admitting: *Deleted

## 2018-10-15 NOTE — Patient Outreach (Signed)
[name]) Care Management  10/15/2018  [name] Dec 27, 1940 [id number]   Telephone screen call for Tier 5 level of care patient. Call to patient number and spoke with patient wife John Ball.  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  RNCM introduced self and verified HIPAA, wife reports she has POA (both healthcare and durable). RNCM discussed [facility] care management program and benefits.  John Ball reports she "knows all about this", she states she cares for patient and she weighs him daily and knows to give medication if weight is up. She reports patient goes to all of his appointments.  John Ball refuses any follow up phone calls at this time, but agrees to receive [facility] care management information.  Plan to send successful outreach letter and sign off. [name]. [name], MSN, RN, [employer] , [place] [phone]) Business Cell  [phone]) Toll Free Office

## 2018-10-20 ENCOUNTER — Encounter (HOSPITAL_COMMUNITY): Payer: Medicare Other

## 2018-10-20 ENCOUNTER — Ambulatory Visit: Payer: Medicare Other | Admitting: Vascular Surgery

## 2018-12-07 ENCOUNTER — Ambulatory Visit (INDEPENDENT_AMBULATORY_CARE_PROVIDER_SITE_OTHER): Payer: Medicare Other | Admitting: Family Medicine

## 2018-12-07 ENCOUNTER — Other Ambulatory Visit: Payer: Self-pay

## 2018-12-07 ENCOUNTER — Encounter: Payer: Self-pay | Admitting: Family Medicine

## 2018-12-07 VITALS — BP 149/91 | HR 109 | Temp 99.2°F | Ht 72.0 in | Wt 184.0 lb

## 2018-12-07 DIAGNOSIS — Z9119 Patient's noncompliance with other medical treatment and regimen: Secondary | ICD-10-CM | POA: Diagnosis not present

## 2018-12-07 DIAGNOSIS — B028 Zoster with other complications: Secondary | ICD-10-CM | POA: Diagnosis not present

## 2018-12-07 DIAGNOSIS — B0223 Postherpetic polyneuropathy: Secondary | ICD-10-CM

## 2018-12-07 DIAGNOSIS — Z91199 Patient's noncompliance with other medical treatment and regimen due to unspecified reason: Secondary | ICD-10-CM

## 2018-12-07 MED ORDER — VALACYCLOVIR HCL 1 G PO TABS: 1000.0000 mg | ORAL_TABLET | Freq: Three times a day (TID) | ORAL | 0 refills | Status: AC

## 2018-12-07 MED ORDER — PREGABALIN 75 MG PO CAPS: 75.0000 mg | ORAL_CAPSULE | Freq: Two times a day (BID) | ORAL | 0 refills | Status: DC

## 2018-12-07 NOTE — Patient Instructions (Signed)
Shingles    Shingles, which is also known as herpes zoster, is an infection that causes a painful skin rash and fluid-filled blisters. It is caused by a virus.  Shingles only develops in people who:   Have had chickenpox.   Have been given a medicine to protect against chickenpox (have been vaccinated). Shingles is rare in this group.  What are the causes?  Shingles is caused by varicella-zoster virus (VZV). This is the same virus that causes chickenpox. After a person is exposed to VZV, the virus stays in the body in an inactive (dormant) state. Shingles develops if the virus is reactivated. This can happen many years after the first (initial) exposure to VZV. It is not known what causes this virus to be reactivated.  What increases the risk?  People who have had chickenpox or received the chickenpox vaccine are at risk for shingles. Shingles infection is more common in people who:   Are older than age 60.   Have a weakened disease-fighting system (immune system), such as people with:  ? HIV.  ? AIDS.  ? Cancer.   Are taking medicines that weaken the immune system, such as transplant medicines.   Are experiencing a lot of stress.  What are the signs or symptoms?  Early symptoms of this condition include itching, tingling, and pain in an area on your skin. Pain may be described as burning, stabbing, or throbbing.  A few days or weeks after early symptoms start, a painful red rash appears. The rash is usually on one side of the body and has a band-like or belt-like pattern. The rash eventually turns into fluid-filled blisters that break open, change into scabs, and dry up in about 2-3 weeks.  At any time during the infection, you may also develop:   A fever.   Chills.   A headache.   An upset stomach.  How is this diagnosed?  This condition is diagnosed with a skin exam. Skin or fluid samples may be taken from the blisters before a diagnosis is made. These samples are examined under a microscope or sent to  a lab for testing.  How is this treated?  The rash may last for several weeks. There is not a specific cure for this condition. Your health care provider will probably prescribe medicines to help you manage pain, recover more quickly, and avoid long-term problems. Medicines may include:   Antiviral drugs.   Anti-inflammatory drugs.   Pain medicines.   Anti-itching medicines (antihistamines).  If the area involved is on your face, you may be referred to a specialist, such as an eye doctor (ophthalmologist) or an ear, nose, and throat (ENT) doctor (otolaryngologist) to help you avoid eye problems, chronic pain, or disability.  Follow these instructions at home:  Medicines   Take over-the-counter and prescription medicines only as told by your health care provider.   Apply an anti-itch cream or numbing cream to the affected area as told by your health care provider.  Relieving itching and discomfort     Apply cold, wet cloths (cold compresses) to the area of the rash or blisters as told by your health care provider.   Cool baths can be soothing. Try adding baking soda or dry oatmeal to the water to reduce itching. Do not bathe in hot water.  Blister and rash care   Keep your rash covered with a loose bandage (dressing). Wear loose-fitting clothing to help ease the pain of material rubbing against the rash.     Keep your rash and blisters clean by washing the area with mild soap and cool water as told by your health care provider.   Check your rash every day for signs of infection. Check for:  ? More redness, swelling, or pain.  ? Fluid or blood.  ? Warmth.  ? Pus or a bad smell.   Do not scratch your rash or pick at your blisters. To help avoid scratching:  ? Keep your fingernails clean and cut short.  ? Wear gloves or mittens while you sleep, if scratching is a problem.  General instructions   Rest as told by your health care provider.   Keep all follow-up visits as told by your health care provider. This  is important.   Wash your hands often with soap and water. If soap and water are not available, use hand sanitizer. Doing this lowers your chance of getting a bacterial skin infection.   Before your blisters change into scabs, your shingles infection can cause chickenpox in people who have never had it or have never been vaccinated against it. To prevent this from happening, avoid contact with other people, especially:  ? Babies.  ? Pregnant women.  ? Children who have eczema.  ? Elderly people who have transplants.  ? People who have chronic illnesses, such as cancer or AIDS.  Contact a health care provider if:   Your pain is not relieved with prescribed medicines.   Your pain does not get better after the rash heals.   You have signs of infection in the rash area, such as:  ? More redness, swelling, or pain around the rash.  ? Fluid or blood coming from the rash.  ? The rash area feeling warm to the touch.  ? Pus or a bad smell coming from the rash.  Get help right away if:   The rash is on your face or nose.   You have facial pain, pain around your eye area, or loss of feeling on one side of your face.   You have difficulty seeing.   You have ear pain or have ringing in your ear.   You have a loss of taste.   Your condition gets worse.  Summary   Shingles, which is also known as herpes zoster, is an infection that causes a painful skin rash and fluid-filled blisters.   This condition is diagnosed with a skin exam. Skin or fluid samples may be taken from the blisters and examined before the diagnosis is made.   Keep your rash covered with a loose bandage (dressing). Wear loose-fitting clothing to help ease the pain of material rubbing against the rash.   Before your blisters change into scabs, your shingles infection can cause chickenpox in people who have never had it or have never been vaccinated against it.  This information is not intended to replace advice given to you by your health care  provider. Make sure you discuss any questions you have with your health care provider.  Document Released: 06/24/2005 Document Revised: 02/26/2017 Document Reviewed: 02/26/2017  Elsevier Interactive Patient Education  2019 [employer]

## 2018-12-07 NOTE — Progress Notes (Signed)
Pt here for an acute care OV today   Impression and Recommendations:    1. Herpes zoster with other complication   2. Shingles (herpes zoster) polyneuropathy   3. Personal history of noncompliance with medical treatment    -Told patient he needs a follow-up appointment for chronic care as well as treating these acute conditions that arise  Herpes zoster with other complication  - Plan: valACYclovir (VALTREX) 1000 MG tablet -since patient is still having some vesicular outbreaks  -Denies any liver problems.  Most recent BMP in the chart from 08/17/2018 which was within normal limits.  GFR was 72.74 -Patient gets most of his care from the [place] and I explained to please go there once this is cleared to get his zoster vaccine which he never did  history of medical noncompliance --> Discussed with patient if he really never comes to me except for acute things possibly, an urgent care would be a better fit for him.  Explained as a primary care physician, it is important he come in at least yearly for physical exams and follow-up regularly for chronic care  Shingles (herpes zoster) polyneuropathy -Explained pathophysiology of the disease process.  Explained that this can be painful long after rash resolves.  For now we will give you a 30-day supply and he will follow-up with me in 3 to 4 weeks to see how he is doing on these medicines  - Plan: pregabalin (LYRICA) 75 MG capsule   Meds ordered this encounter  Medications  . valACYclovir (VALTREX) 1000 MG tablet    Sig: Take 1 tablet (1,000 mg total) by mouth 3 (three) times daily for 7 days.    Dispense:  21 tablet    Refill:  0  . pregabalin (LYRICA) 75 MG capsule    Sig: Take 1 capsule (75 mg total) by mouth 2 (two) times daily.    Dispense:  60 capsule    Refill:  0     Education and routine counseling performed. Handouts provided  Gross side effects, risk and benefits, and alternatives of medications and treatment plan in general  discussed with patient.  Patient is aware that all medications have potential side effects and we are unable to predict every side effect or drug-drug interaction that may occur.   Patient will call with any questions prior to using medication if they have concerns.    Expresses verbal understanding and consents to current therapy and treatment regimen.  No barriers to understanding were identified.  Red flag symptoms and signs discussed in detail.  Patient expressed understanding regarding what to do in case of emergency\urgent symptoms   Please see AVS handed out to patient at the end of our visit for further patient instructions/ counseling done pertaining to today's office visit.   Return for 3-4 wks for reck rash, see how doing w/ neuropathy and lyrica.     Note:  This document was prepared occasionally using Dragon voice recognition software and may include unintentional dictation errors in addition to a scribe.  Mellody Dance, DO 12/07/2018 5:11 PM     --------------------------------------------------------------------------------------------------------------------------------------------------------------------------------------------------------------------------------------------    Subjective:    CC:  Chief Complaint  Patient presents with  . Rash    HPI: John Ball is a 78 y.o. male who presents to [place] at [facility] today for issues as discussed below.   Patient presents to the clinic today with complaints of an acute rash.  This started 3-4 days ago was  the first outbreak and since we were not open Friday, Saturday or Sunday, he did not present to [place].  Also they could not find any urgent cares that were open as well.  Rash started off like little clear vesicles that were fluid-filled.  It was very itchy and "a little stinging" at first.  Then as the rash spread from the right upper posterior shoulder blade region around to his lat  shoulder and then onto the anterior chest in a dermatomal pattern, it became very burning and he had sensitivity with just to wear his shirt or bed sheets on it etc. patient denies rash anywhere else on his body.  No face involvement.  -Patient gets most of his chronic care from the [place].  That is where he gets his medicines from etc.  He says he is only here because he occasionally needs a doctor nearby to go to for acute things.  I also see his wife chronically   -3 months ago serum creatinine was 1.0 and the rest of the BMP was completely normal.   Wt Readings from Last 3 Encounters:  12/07/18 184 lb (83.5 kg)  09/15/18 187 lb 9.6 oz (85.1 kg)  08/17/18 189 lb (85.7 kg)   BP Readings from Last 3 Encounters:  12/07/18 (!) 149/91  09/15/18 134/66  08/17/18 126/74   BMI Readings from Last 3 Encounters:  12/07/18 24.95 kg/m  09/15/18 25.44 kg/m  08/17/18 25.63 kg/m     Patient Care Team    Relationship Specialty Notifications Start End  Mellody Dance, DO PCP - General Family Medicine  05/07/17      Patient Active Problem List   Diagnosis Date Noted  . Atypical angina ([place]) 08/28/2017    Priority: High  . COPD exacerbation ([place]) 07/21/2016    Priority: High  . Coronary artery disease involving coronary bypass graft of native heart with angina pectoris [facility])     Priority: High  . Peripheral arterial occlusive disease ([place])     Priority: High  . Smoker unmotivated to quit     Priority: High  . Essential hypertension     Priority: High  . 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 OSA (obstructive sleep apnea) 09/10/2017    Priority: Medium  . Bipolar I disorder ([place]) 05/07/2017    Priority: Medium  . Major depression, recurrent ([place]) 07/21/2016    Priority: Medium  . Hyperlipidemia with target LDL less than 70     Priority: Medium  . Lung cancer ([place]) 05/15/2011    Priority: Medium  . PTSD (post-traumatic stress disorder) 09/10/2017    Priority: Low  . No  natural teeth 09/10/2017    Priority: Low  . Hearing loss of both ears 09/10/2017    Priority: Low  . 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 Personal history of noncompliance with medical treatment 12/07/2018  . Chronic respiratory failure with hypoxia ([place]) 09/15/2018  . 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)   . post op Wound infection 11/28/2017  . Sepsis ([place]) 11/28/2017  . PAD (peripheral artery disease) ([place]) 11/17/2017  . CAP (community acquired pneumonia) 09/22/2017  . SOB (shortness of breath) 09/12/2017  . Radial artery occlusion, left ([place]) 08/28/2017  . Acute on chronic respiratory failure with hypoxia ([place]) 07/21/2016  . Chronic diastolic heart failure ([place]) 07/21/2016  . Abnormal stress test   . Chest pain at rest 06/06/2014  .  COPD (chronic obstructive pulmonary disease) ([place])   . Pain of left lower extremity 02/01/2014  . Left main coronary artery disease 07/08/2002      Past Medical History:  Diagnosis Date  . Anxiety   . Arthritis   . Complication of anesthesia    hx prolonged post op oxygen dependent/  hx hallucinations for 2 day post op lobectomy 2012  . COPD, severe ([place])   . Depression   . Essential hypertension   . Hearing loss of both ears    does wear his hearing aids  . History of arterial bypass of lower extremity    RIGHT FEM-POP  . History of CHF (congestive heart failure)   . History of DVT of lower extremity   . History of panic attacks   . Hyperlipidemia with target LDL less than 70   . Left main coronary artery disease 2004   a) Severe LM CAD 2004 -->s/p  cabg x4 (LIMA-LAD, free RIMA-RI, SVG-OM 2, SVG-RPDA); b) 06/2014: Abnormal Myoview --> c)cardiac Cath Jan 2016: pLM 70%, pLAD 40-50%- patent LIMA-LAD.  RI -competitive flow noted from RIMA-RI graft, circumflex, normal caliber with moderate OM1.  OM 2 occluded.  SVG-OM 2 patent. RCA CTO after large RV M, faint R-R and LAD  septal-PDA collaterals =--> new  . Neuropathy, peripheral   . 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 Nocturia   . OSA (obstructive sleep apnea)    uses O2  via [place]  --  intolerate to CPAP  . Peripheral arterial occlusive disease ([place])    2011   a) Gore-Tex graft right AK popA-BK popA --> b) 2012: thrombosed graft --> thrombectomy with dacron patch = 1 V runnof via Peroneal; c) 01/2014: R femoropopliteal bypass with left femoral vein;; followed by dr Early--  per last dopplers 06-28-2014 no change right graft,  50-74% stenosis common and left mid superficial femoral artery  . PTSD (post-traumatic stress disorder)    anxiety attack's---  [place]  . Recurrent productive cough    SMOKER'S COUGH  . 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  Medical management  . Smoker unmotivated to quit    Reportedly he came close to having a nervous breakdown when he last tried to quit  . 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 Wears glasses      Past Surgical History:  Procedure Laterality Date  . ABDOMINAL AORTOGRAM N/A 11/12/2017   Procedure: ABDOMINAL AORTOGRAM;  Surgeon: Waynetta Sandy, MD;  Location: [place] [facility];  Service: Cardiovascular;  Laterality: N/A;  . ABDOMINAL AORTOGRAM W/LOWER EXTREMITY Left 02/10/2018   Procedure: ABDOMINAL AORTOGRAM W/LOWER EXTREMITY;  Surgeon: Serafina Mitchell, MD;  Location: [place] [facility];  Service: Cardiovascular;  Laterality: Left;  . APPLICATION OF WOUND VAC Left 12/03/2017   Procedure: EXCHANGE OF  WOUND VAC LEFT GROIN,;  Surgeon: Rosetta Posner, MD;  Location: [place];  Service: Vascular;  Laterality: Left;  . CARDIAC CATHETERIZATION  07-13-2002   Ischemia RCA region on Cardiolite// 60-70% ostial left main, 50% midCx, 60-70% mid RI,  70% mRCA, 90% JeNu and crux 75% RCA ,  95% PLA//  severe LM and 3Vessel CAD, perserved LV, ef 60%  . CARDIOVASCULAR STRESS TEST  last one  06-17-2014   dr  Ellyn Hack   Carlton Adam study with no exercise; Intermediate Risk Study;   moderate size and intensity, partially reversible inferior septal defect consistent with prior infarct and mild to moderate peri-infarct ischemia;  mild hypokinesis of the inferior septal wall,  normal LV function, ef 56%  . COLONOSCOPY  2008    WNL  . CORONARY ARTERY BYPASS GRAFT  07/27/2002   LIMA-LAD, freeRIMA-RI,SVG-OM2, SVG-rPDA; Dr. Roxan Hockey  . CYSTOSCOPY W/ URETERAL STENT PLACEMENT N/A 02/21/2015   Procedure: BILATERAL RETROGRADE PYELOGRAM;  Surgeon: Festus Aloe, MD;  Location: [facility];  Service: Urology;  Laterality: N/A;  . ENDARTERECTOMY FEMORAL Left 11/17/2017   Procedure: LEFT Femoral Endarterectomy with Patch Angioplasty;  Surgeon: Rosetta Posner, MD;  Location: [place];  Service: Vascular;  Laterality: Left;  . EYE SURGERY Left Nov 2013  . FEMORAL ARTERY - POPLITEAL ARTERY BYPASS GRAFT Right 10-24-2008   dr early   w/ GoreTex graft  . FEMORAL-POPLITEAL BYPASS GRAFT Left 02/11/2018   Procedure: BYPASS GRAFT LOWER EXTREMITY;  Surgeon: Rosetta Posner, MD;  Location: [place];  Service: Vascular;  Laterality: Left;  . FEMORAL-TIBIAL BYPASS GRAFT Right 01/10/2014   Procedure: Right Femoral to Posterior Tibial Bypass Graft using Left Leg Vein, Thrombectomy Right Common Femoral and Profunda of Leg . ;  Surgeon: Rosetta Posner, MD;  Location: [place];  Service: Vascular;  Laterality: Right;  . HYDROCELE  EXCISION Left   . LEFT ANKLE SURGERY  1990's  . LEFT HEART CATHETERIZATION WITH CORONARY/GRAFT ANGIOGRAM N/A 07/12/2014   Procedure: LEFT HEART CATHETERIZATION WITH Beatrix Fetters;  Surgeon: Troy Sine, MD;  Location[facility];  Service: Cardiovascular;  Laterality: N/A;  pLM 70%, pLAD 40-50%- patent LIMA-LAD.  RI -competitive flow noted from fRIMA-RI graft, circumflex, normal caliber with moderate OM1.  OM 2 occluded.  SVG-OM 2 patent. RCA CTO after large RVM, faint R-R and LAD septal-PDA collateral  . LOWER EXTREMITY ANGIOGRAPHY Bilateral 11/12/2017   Procedure: LOWER EXTREMITY ANGIOGRAPHY - Left Lower;  Surgeon: Waynetta Sandy, MD;  Location: [place] [facility];  Service: Cardiovascular;  Laterality: Bilateral;  . NM MYOVIEW LTD  05/2014   Moderate sized, moderate intensity/partially reversible inferior defect consistent with prior infarct mild/moderate peri-infarct ischemia.  Inferoseptal HK.  INTERMEDIATE RISK. --> Cath showed CTO of Native RCA & SVG-rPDA.  Marland Kitchen PERIPHERAL VASCULAR INTERVENTION Left 02/10/2018   Procedure: PERIPHERAL VASCULAR INTERVENTION;  Surgeon: Serafina Mitchell, MD;  Location: [place] [facility];  Service: Cardiovascular;  Laterality: Left;  left common iliac stent, attempted left SFA stent  . RIGHT ABOVE KNEE POPLITEAL GRAFT TO BELOW KNEE POPLITEAL ARTERY BYPASS WITH REVERSE SAPHENOUS VEIN  03-02-2010  . THROMBECTOMY OF RIGHT FEMORAL TO ABOVE KNEE POPLITEAL GORETEX GRAFT ANGIOPLASTY OF GORETEX AND SAPHENOUS VEIN JUNCTION AND ABOVE KNEE POPLITEAL ARTERY   10-06-2010   DR EARLY  . TONSILLECTOMY    . TRANSURETHRAL RESECTION OF BLADDER TUMOR N/A 02/21/2015   Procedure: TRANSURETHRAL RESECTION OF BLADDER TUMOR (TURBT);  Surgeon: Festus Aloe, MD;  Location: [facility];  Service: Urology;  Laterality: N/A;  . VIDEO ASSISTED THORACOSCOPY (VATS)/WEDGE RESECTION Right 05-01-2011    FORSYTH   LOWER LOBECTOMY W/  NODE DISSECTION  . WOUND  EXPLORATION Left 11/28/2017   Procedure: WOUND EXPLORATION GROIN, WASHOUT AND WOUND VAC APPLICATION;  Surgeon: Conrad Saranac, MD;  Location: [facility] OR;  Service: Vascular;  Laterality: Left;     Family History  Problem Relation Age of Onset  . Other Mother 81       Abdominal Aortic Aneurysm   . Stroke Mother   . Alzheimer's disease Father 40  . Stroke Father      Social History   Socioeconomic History  . Marital status: Married    Spouse name: Not on file  . Number of children: 2  . Years of education: Not on file  . Highest education level: Not on file  Occupational History  . Not on file  Social Needs  . Financial resource strain: Not on file  . Food insecurity:    Worry: Not on file    Inability: Not on file  . Transportation needs:    Medical: Not on file    Non-medical: Not on file  Tobacco Use  . Smoking status: Current Every Day Smoker    Packs/day: 1.50    Years: 60.00    Pack years: 90.00    Types: Cigarettes    Start date: 09/14/1957  . Smokeless tobacco: Never Used  . Tobacco comment: 3/4 pk per day 3/10  Substance and Sexual Activity  . Alcohol use: No  . Drug use: No  . Sexual activity: Not on file  Lifestyle  . Physical activity:    Days per week: Not on file    Minutes per session: Not on file  . Stress: Not on file  Relationships  . Social connections:    Talks on phone: Not on file    Gets together: Not on file    Attends religious service: Not on file    Active member of club or organization: Not on file    Attends meetings of clubs or organizations: Not on file    Relationship status: Not on file  . Intimate partner violence:    Fear of current or ex partner: Not on file    Emotionally abused: Not on file    Physically abused: Not on file    Forced sexual activity: Not on file  Other Topics Concern  . Not on file  Social History Narrative   He is married with 2 children. Does not exercise regularly.   He and his wife have been recently  living in [place], [place] (but just moved back to [place], [place] in November).   He still smokes about a half-pack a day and is not interested in quitting. No alcohol or recreational drug use.     Current Meds  Medication Sig  . acetaminophen (TYLENOL) 500 MG tablet Take 1,000 mg by mouth every 6 (six) hours as needed for moderate pain or headache.   . ADVAIR DISKUS 500-50 MCG/DOSE AEPB Inhale 1 puff into the lungs 2 (two) times daily.   Marland Kitchen albuterol (PROVENTIL HFA;VENTOLIN HFA) 108 (90 BASE) MCG/ACT inhaler Inhale 1 puff into the lungs every 6 (six) hours as needed for wheezing or shortness of breath.  Marland Kitchen albuterol (PROVENTIL) (2.5 MG/3ML) 0.083% nebulizer solution   . Carbomer Gel Base (HYDROGEL) GEL 1 g by Does not apply route daily.  . Cholecalciferol (VITAMIN D3) 1000 UNITS  CAPS Take 1,000 Units by mouth every morning.   . clonazePAM (KLONOPIN) 1 MG tablet Take 1 mg by mouth 2 (two) times daily.   . DULoxetine (CYMBALTA) 30 MG capsule Take 30-60 mg by mouth See admin instructions. 60mg  (2 capsules) every morning and 30mg  (1 capsule) daily at evening  . gabapentin (NEURONTIN) 100 MG capsule Take 100 mg by mouth at bedtime.   . nitroGLYCERIN (NITROSTAT) 0.4 MG SL tablet Place 1 tablet (0.4 mg total) under the tongue every 5 (five) minutes as needed for chest pain.  . OXYGEN Inhale 2 L into the lungs See admin instructions. Uses 2L at bedtime and if needed throughout the day -- intolerant to CPAP  . prazosin (MINIPRESS) 1 MG capsule Take 1 mg by mouth at bedtime.  Marland Kitchen QUEtiapine (SEROQUEL XR) 300 MG 24 hr tablet Take 300 mg by mouth at bedtime.   . ramipril (ALTACE) 2.5 MG capsule Take 2.5 mg by mouth every morning.   . tiotropium (SPIRIVA HANDIHALER) 18 MCG inhalation capsule Place 18 mcg into inhaler and inhale daily.  . traZODone (DESYREL) 150 MG tablet Take 0.5 tablets (75 mg total) by mouth at bedtime as needed for sleep. (Patient taking differently: Take 150 mg by mouth at bedtime. )  .  zolpidem (AMBIEN CR) 12.5 MG CR tablet Take 12.5 mg by mouth at bedtime.    Allergies:  Allergies  Allergen Reactions  . Aspirin Other (See Comments)    Avoids due to severe bruises  . Crestor [Rosuvastatin Calcium] Other (See Comments)    myalgia  . Lipitor [Atorvastatin Calcium] Other (See Comments)    myalgia  . Zetia [Ezetimibe] Other (See Comments)    myalgia  . Zocor [Simvastatin - High Dose] Other (See Comments)    myalgia  . Aripiprazole Other (See Comments)    Altered mental status  . Effexor [Venlafaxine Hydrochloride] Itching  . Morphine And Related Itching     Review of Systems: General:   Denies fever, chills, unexplained weight loss.  Optho/Auditory:   Denies visual changes, blurred vision/LOV Respiratory:   Denies wheeze, DOE more than baseline levels.   Cardiovascular:   Denies chest pain, palpitations, new onset peripheral edema  Gastrointestinal:   Denies nausea, vomiting, diarrhea, abd pain.  Genitourinary: Denies dysuria, freq/ urgency, flank pain or discharge from genitals.  Endocrine:     Denies hot or cold intolerance, polyuria, polydipsia. Musculoskeletal:   Denies unexplained myalgias, joint swelling, unexplained arthralgias, gait problems.  Skin:  Denies new onset rash, suspicious lesions Neurological:     Denies dizziness, unexplained weakness, numbness  Psychiatric/Behavioral:   Denies mood changes, suicidal or homicidal ideations, hallucinations    Objective:   Blood pressure (!) 149/91, pulse (!) 109, temperature 99.2 F (37.3 C), height 6' (1.829 m), weight 184 lb (83.5 kg), SpO2 93 %. Body mass index is 24.95 kg/m. General: Appears older than stated age.  Neuro:  Alert and oriented,  extra-ocular muscles intact  HEENT:  Normocephalic, atraumatic Skin: Clusters of vesicles -(some clear fluid filled, some already de-surfaced and scabbed) in the dermatomal distribution of C5-T1 posteriorly on R as well as goes around to the outside of the R  shoulder and then to the R anterior chest in the same dermatomal distribution -never crosses the midline.   Vascular:  Ext warm, no cyanosis apprec.; cap RF less 2 sec. Psych: Highly irritable and easily agitated

## 2019-01-04 ENCOUNTER — Other Ambulatory Visit: Payer: Self-pay

## 2019-01-04 ENCOUNTER — Ambulatory Visit (INDEPENDENT_AMBULATORY_CARE_PROVIDER_SITE_OTHER): Payer: Medicare Other | Admitting: Family Medicine

## 2019-01-04 ENCOUNTER — Encounter: Payer: Self-pay | Admitting: Family Medicine

## 2019-01-04 VITALS — BP 135/75 | HR 79 | Temp 97.8°F | Ht 72.0 in | Wt 186.0 lb

## 2019-01-04 DIAGNOSIS — B0223 Postherpetic polyneuropathy: Secondary | ICD-10-CM

## 2019-01-04 DIAGNOSIS — B0229 Other postherpetic nervous system involvement: Secondary | ICD-10-CM | POA: Diagnosis not present

## 2019-01-04 DIAGNOSIS — B028 Zoster with other complications: Secondary | ICD-10-CM | POA: Diagnosis not present

## 2019-01-04 MED ORDER — PREGABALIN 75 MG PO CAPS: 75.0000 mg | ORAL_CAPSULE | Freq: Three times a day (TID) | ORAL | 5 refills | Status: DC

## 2019-01-04 MED ORDER — LIDOCAINE 5 % EX PTCH: 3.0000 | MEDICATED_PATCH | Freq: Two times a day (BID) | CUTANEOUS | 1 refills | Status: DC

## 2019-01-04 NOTE — Progress Notes (Signed)
Impression and Recommendations:    1. Postherpetic neuralgia   2. Herpes zoster with other complication   3. Shingles (herpes zoster) polyneuropathy    Postherpetic neuralgia - Plan: Continues to have terrible pain waking him up at night.  Unable to wear clothes -Start Lidoderm patches.  Herpes zoster with other complication -  Plan: Continue pregabalin daily  -Since patient is on Ambien and trazodone as well as his Seroquel at night we will hold off on Elavil and see if the patches work for his pain, as well as increasing Lyrica from twice daily to 3 times daily.   - If need be we will add Elavil to be taken around 2-3 in the morning when he wakes up to help him go back to sleep and help him with the pain   Meds ordered this encounter  Medications  . lidocaine (LIDODERM) 5 %    Sig: Place 3 patches onto the skin every 12 (twelve) hours. Remove & Discard patch within 12 hours or as directed by MD    Dispense:  90 patch    Refill:  1  . pregabalin (LYRICA) 75 MG capsule    Sig: Take 1 capsule (75 mg total) by mouth 3 (three) times daily.    Dispense:  90 capsule    Refill:  5    Medications Discontinued During This Encounter  Medication Reason  . clonazePAM (KLONOPIN) 1 MG tablet Discontinued by provider  . gabapentin (NEURONTIN) 100 MG capsule Discontinued by provider  . pregabalin (LYRICA) 75 MG capsule Reorder    Gross side effects, risk and benefits, and alternatives of medications and treatment plan in general discussed with patient.  Patient is aware that all medications have potential side effects and we are unable to predict every side effect or drug-drug interaction that may occur.   Patient will call with any questions prior to using medication if they have concerns.    Expresses verbal understanding and consents to current therapy and treatment regimen.  No barriers to understanding were identified.  Red flag symptoms and signs discussed in detail.  Patient  expressed understanding regarding what to do in case of emergency\urgent symptoms  Please see AVS handed out to patient at the end of our visit for further patient instructions/ counseling done pertaining to today's office visit.   Return if symptoms worsen or fail to improve- let me know we can add elavil, for Otherwise follow-up in 1 month.     Note:  This note was prepared with assistance of Dragon voice recognition software. Occasional wrong-word or sound-a-like substitutions may have occurred due to the inherent limitations of voice recognition software.  Marjory Sneddon, DO 01/04/2019 11 AM    --------------------------------------------------------------------------------------------------------------------------------------------------------------------------------------------------------------------------------------------    Subjective:     HPI: John Ball is a 78 y.o. male who presents to [place] at [facility] today for issues as discussed below.   Patient was last seen 12/07/2018 diagnosed with herpes zoster and given Valtrex as well as Lyrica.  He is here for follow-up today.    Wt Readings from Last 3 Encounters:  01/04/19 186 lb (84.4 kg)  12/07/18 184 lb (83.5 kg)  09/15/18 187 lb 9.6 oz (85.1 kg)   BP Readings from Last 3 Encounters:  01/04/19 135/75  12/07/18 (!) 149/91  09/15/18 134/66   Pulse Readings from Last 3 Encounters:  01/04/19 79  12/07/18 (!) 109  09/15/18 70   BMI Readings from Last  3 Encounters:  01/04/19 25.23 kg/m  12/07/18 24.95 kg/m  09/15/18 25.44 kg/m     Patient Care Team    Relationship Specialty Notifications Start End  Mellody Dance, DO PCP - General Family Medicine  05/07/17      Patient Active Problem List   Diagnosis Date Noted  . Atypical angina ([place]) 08/28/2017    Priority: High  . COPD exacerbation ([place]) 07/21/2016    Priority: High  . Coronary artery disease involving coronary  bypass graft of native heart with angina pectoris [facility])     Priority: High  . Peripheral arterial occlusive disease ([place])     Priority: High  . Smoker unmotivated to quit     Priority: High  . Essential hypertension     Priority: High  . 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 OSA (obstructive sleep apnea) 09/10/2017    Priority: Medium  . Bipolar I disorder ([place]) 05/07/2017    Priority: Medium  . Major depression, recurrent ([place]) 07/21/2016    Priority: Medium  . Hyperlipidemia with target LDL less than 70     Priority: Medium  . Lung cancer ([place]) 05/15/2011    Priority: Medium  . PTSD (post-traumatic stress disorder) 09/10/2017    Priority: Low  . No natural teeth 09/10/2017    Priority: Low  . Hearing loss of both ears 09/10/2017    Priority: Low  . 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 Personal history of noncompliance with medical treatment 12/07/2018  . Chronic respiratory failure with hypoxia ([place]) 09/15/2018  . 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)   . post op Wound infection 11/28/2017  . Sepsis ([place]) 11/28/2017  . PAD (peripheral artery disease) ([place]) 11/17/2017  . CAP (community acquired pneumonia) 09/22/2017  . SOB (shortness of breath) 09/12/2017  . Radial artery occlusion, left ([place]) 08/28/2017  . Acute on chronic respiratory failure with hypoxia ([place]) 07/21/2016  . Chronic diastolic heart failure ([place]) 07/21/2016  . Abnormal stress test   . Chest pain at rest 06/06/2014  . COPD (chronic obstructive pulmonary disease) ([place])   . Pain of left lower extremity 02/01/2014  . Left main coronary artery disease 07/08/2002    Past Medical history, Surgical history, Family history, Social history, Allergies and Medications have been entered into the medical record, reviewed and changed as needed.    Current Meds  Medication Sig  . acetaminophen (TYLENOL) 500 MG tablet Take 1,000 mg by mouth  every 6 (six) hours as needed for moderate pain or headache.   . ADVAIR DISKUS 500-50 MCG/DOSE AEPB Inhale 1 puff into the lungs 2 (two) times daily.   Marland Kitchen albuterol (PROVENTIL HFA;VENTOLIN HFA) 108 (90 BASE) MCG/ACT inhaler Inhale 1 puff into the lungs every 6 (six) hours as needed for wheezing or shortness of breath.  Marland Kitchen albuterol (PROVENTIL) (2.5 MG/3ML) 0.083% nebulizer solution   . Carbomer Gel Base (HYDROGEL) GEL 1 g by Does not apply route daily.  . Cholecalciferol (VITAMIN D3) 1000 UNITS CAPS Take 1,000 Units by mouth every morning.   . DULoxetine (CYMBALTA) 30 MG capsule Take 30-60 mg by mouth See admin instructions. 60mg  (2 capsules) every morning and 30mg  (1 capsule) daily at evening  . nitroGLYCERIN (NITROSTAT) 0.4 MG SL tablet Place 1 tablet (0.4 mg total) under the tongue every 5 (five) minutes as needed for chest pain.  . OXYGEN Inhale 2 L into the lungs See admin  instructions. Uses 2L at bedtime and if needed throughout the day -- intolerant to CPAP  . prazosin (MINIPRESS) 1 MG capsule Take 1 mg by mouth at bedtime.  . pregabalin (LYRICA) 75 MG capsule Take 1 capsule (75 mg total) by mouth 3 (three) times daily.  . QUEtiapine (SEROQUEL XR) 300 MG 24 hr tablet Take 300 mg by mouth at bedtime.   . ramipril (ALTACE) 2.5 MG capsule Take 2.5 mg by mouth every morning.   . tiotropium (SPIRIVA HANDIHALER) 18 MCG inhalation capsule Place 18 mcg into inhaler and inhale daily.  . traZODone (DESYREL) 150 MG tablet Take 0.5 tablets (75 mg total) by mouth at bedtime as needed for sleep. (Patient taking differently: Take 150 mg by mouth at bedtime. )  . zolpidem (AMBIEN CR) 12.5 MG CR tablet Take 12.5 mg by mouth at bedtime.  . [DISCONTINUED] pregabalin (LYRICA) 75 MG capsule Take 1 capsule (75 mg total) by mouth 2 (two) times daily.    Allergies:  Allergies  Allergen Reactions  . Aspirin Other (See Comments)    Avoids due to severe bruises  . Crestor [Rosuvastatin Calcium] Other (See  Comments)    myalgia  . Lipitor [Atorvastatin Calcium] Other (See Comments)    myalgia  . Zetia [Ezetimibe] Other (See Comments)    myalgia  . Zocor [Simvastatin - High Dose] Other (See Comments)    myalgia  . Aripiprazole Other (See Comments)    Altered mental status  . Effexor [Venlafaxine Hydrochloride] Itching  . Morphine And Related Itching     Review of Systems:  A fourteen system review of systems was performed and found to be positive as per HPI.   Objective:   Blood pressure 135/75, pulse 79, temperature 97.8 F (36.6 C), height 6' (1.829 m), weight 186 lb (84.4 kg), SpO2 99 %. Body mass index is 25.23 kg/m. General:  Well Developed, well nourished, appropriate for stated age.  Neuro:  Alert and oriented,  extra-ocular muscles intact  HEENT:  Normocephalic, atraumatic, neck supple, no carotid bruits appreciated  Skin:  no gross rash, warm, pink. Cardiac:  RRR, S1 S2 Respiratory:  ECTA B/L and A/P, Not using accessory muscles, speaking in full sentences- unlabored. Vascular:  Ext warm, no cyanosis apprec.; cap RF less 2 sec. Psych:  No HI/SI, judgement and insight good, Euthymic mood. Full Affect.

## 2019-01-14 ENCOUNTER — Ambulatory Visit (INDEPENDENT_AMBULATORY_CARE_PROVIDER_SITE_OTHER): Payer: Medicare Other | Admitting: Pulmonary Disease

## 2019-01-14 ENCOUNTER — Encounter: Payer: Self-pay | Admitting: Pulmonary Disease

## 2019-01-14 ENCOUNTER — Other Ambulatory Visit: Payer: Self-pay

## 2019-01-14 VITALS — BP 136/62 | HR 76 | Temp 97.7°F | Ht 72.0 in | Wt 185.0 lb

## 2019-01-14 DIAGNOSIS — Z720 Tobacco use: Secondary | ICD-10-CM | POA: Diagnosis not present

## 2019-01-14 DIAGNOSIS — J9611 Chronic respiratory failure with hypoxia: Secondary | ICD-10-CM | POA: Diagnosis not present

## 2019-01-14 DIAGNOSIS — J432 Centrilobular emphysema: Secondary | ICD-10-CM | POA: Diagnosis not present

## 2019-01-14 NOTE — Patient Instructions (Signed)
Follow up in 6 months 

## 2019-01-14 NOTE — Progress Notes (Signed)
[facility]  Chief Complaint  Patient presents with  . Follow-up    had shingles 6 weeks ago, O2 3 liters [place] at all times, states breathing the same    Constitutional:  BP 136/62 (BP Location: Right Arm, Cuff Size: Normal)   Pulse 76   Temp 97.7 F (36.5 C) (Oral)   Ht 6' (1.829 m)   Wt 185 lb (83.9 kg)   SpO2 99%   BMI 25.09 kg/m   Past Medical History:  CAD s/p CABG, PTSD, PAD, OSA intolerant of CPAP, Neuropathy, HLD, DVT, diastolic CHF, HTN, Depression, OA, Anxiety  Brief Summary:  John Ball is a 78 y.o. male smoker with COPD with emphysema and chronic bronchitis, and chronic respiratory failure with hypoxia.  He has hx of NSCLC s/p Rt lower lobectomy.  He is down to 1/2 ppd.  Breathing better.  Not as much cough, sputum, or wheeze.  Wears oxygen at 3 liters.  Not having chest pain.  Mild ankle swelling.  CT chest from February showed decreased adenopathy.  Physical Exam:   Appearance - wearing oxygen  ENMT - no sinus tenderness, no nasal discharge, no oral exudate, decreased hearing  Neck - no masses, trachea midline, no thyromegaly, no elevation in JVP  Respiratory - normal appearance of chest wall, normal respiratory effort w/o accessory muscle use, no dullness on percussion, no wheezing or rales, decreased breath sounds  CV - s1s2 regular rate and rhythm, no murmurs, Lt > Rt ankle edema, radial pulses symmetric  GI - soft, non tender  Lymph - no adenopathy noted in neck and axillary areas  MSK - normal gait  Ext - no cyanosis, clubbing, or joint inflammation noted  Skin - no rashes, lesions, or ulcers  Neuro - normal strength, oriented x 3  Psych - anxious.  Assessment/Plan:   COPD with emphysema and chronic bronchitis. - continue spiriva, advair, prn albuterol  Chronic respiratory failure with hypoxia. - he has PaO2 < 60 and chronic leg edema in setting of COPD - 3 liters oxygen 24/7   Tobacco abuse. - encouraged him to keep up with smoking cessation efforts   Patient Instructions  Follow up in 6 months    Chesley Mires, MD [place] Pager: [phone] 01/14/2019, 12:18 PM  Flow Sheet     Pulmonary tests:  Spirometry 03/05/16 >> FEV1 1.21 (36%), FEV1% 54% ABG 03/07/16 >> pH 7.40, PaCO2 42.6, PaO2 59.3  Chest imaging:  CT chest 04/20/15 >> atherosclerosis, mild centrilobular/paraseptal emphysema, s/p RLL ectomy CT angio chest 09/12/17 >> thickened Rt diaphragm, s/p RLLectomy, tree in bud CT chest 08/19/18 >> centrilobular/paraseptal emphysema, volume loss Rt hemithorax, decreased LAN  Cardiac tests:  Echo 09/26/17 >> EF 50 to 55%, grade 1 DD, PAS 36 mmHg  Medications:   Allergies as of 01/14/2019      Reactions   Aspirin Other (See Comments)   Avoids due to severe bruises   Crestor [rosuvastatin Calcium] Other (See Comments)   myalgia   Lipitor [atorvastatin Calcium] Other (See Comments)   myalgia   Zetia [ezetimibe] Other (See Comments)   myalgia   Zocor [simvastatin - High Dose] Other (See Comments)   myalgia   Aripiprazole Other (See Comments)   Altered mental status   Effexor [venlafaxine Hydrochloride] Itching   Morphine And Related Itching      Medication List       Accurate as of January 14, 2019 12:18 PM. If you have any  questions, ask your nurse or doctor.        acetaminophen 500 MG tablet Commonly known as: TYLENOL Take 1,000 mg by mouth every 6 (six) hours as needed for moderate pain or headache.   Advair Diskus 500-50 MCG/DOSE Aepb Generic drug: Fluticasone-Salmeterol Inhale 1 puff into the lungs 2 (two) times daily.   albuterol 108 (90 Base) MCG/ACT inhaler Commonly known as: VENTOLIN HFA Inhale 1 puff into the lungs every 6 (six) hours as needed for wheezing or shortness of breath.   albuterol (2.5 MG/3ML) 0.083% nebulizer solution Commonly known as: PROVENTIL   DULoxetine 30 MG capsule Commonly known  as: CYMBALTA Take 30-60 mg by mouth See admin instructions. 60mg  (2 capsules) every morning and 30mg  (1 capsule) daily at evening   Hydrogel Gel 1 g by Does not apply route daily.   lidocaine 5 % Commonly known as: Lidoderm Place 3 patches onto the skin every 12 (twelve) hours. Remove & Discard patch within 12 hours or as directed by MD   nitroGLYCERIN 0.4 MG SL tablet Commonly known as: NITROSTAT Place 1 tablet (0.4 mg total) under the tongue every 5 (five) minutes as needed for chest pain.   OXYGEN Inhale 2 L into the lungs See admin instructions. Uses 2L at bedtime and if needed throughout the day -- intolerant to CPAP   prazosin 1 MG capsule Commonly known as: MINIPRESS Take 1 mg by mouth at bedtime.   pregabalin 75 MG capsule Commonly known as: Lyrica Take 1 capsule (75 mg total) by mouth 3 (three) times daily.   QUEtiapine 300 MG 24 hr tablet Commonly known as: SEROQUEL XR Take 300 mg by mouth at bedtime.   ramipril 2.5 MG capsule Commonly known as: ALTACE Take 2.5 mg by mouth every morning.   Spiriva HandiHaler 18 MCG inhalation capsule Generic drug: tiotropium Place 18 mcg into inhaler and inhale daily.   traZODone 150 MG tablet Commonly known as: DESYREL Take 0.5 tablets (75 mg total) by mouth at bedtime as needed for sleep. What changed:   how much to take  when to take this   Vitamin D3 25 MCG (1000 UT) Caps Take 1,000 Units by mouth every morning.   zolpidem 12.5 MG CR tablet Commonly known as: AMBIEN CR Take 12.5 mg by mouth at bedtime.       Past Surgical History:  He  has a past surgical history that includes Femoral-tibial Bypass Graft (Right, 01/10/2014); left heart catheterization with coronary/graft angiogram (N/A, 07/12/2014); Coronary artery bypass graft (07/27/2002); Cardiovascular stress test (last one  06-17-2014   dr  harding); Tonsillectomy; Femoral artery - popliteal artery bypass graft (Right, 10-24-2008   dr early); RIGHT ABOVE KNEE  POPLITEAL GRAFT TO BELOW KNEE POPLITEAL ARTERY BYPASS WITH REVERSE SAPHENOUS VEIN (03-02-2010); THROMBECTOMY OF RIGHT FEMORAL TO ABOVE KNEE POPLITEAL GORETEX GRAFT ANGIOPLASTY OF GORETEX AND SAPHENOUS VEIN JUNCTION AND ABOVE KNEE POPLITEAL ARTERY  (10-06-2010   DR EARLY); Video assisted thoracoscopy (vats)/wedge resection (Right, 05-01-2011    FORSYTH); Cardiac catheterization (07-13-2002); LEFT ANKLE SURGERY (1990's); Hydrocele surgery (Left); Eye surgery (Left, Nov 2013); Colonoscopy (2008    WNL); Transurethral resection of bladder tumor (N/A, 02/21/2015); Cystoscopy w/ ureteral stent placement (N/A, 02/21/2015); NM MYOVIEW LTD (05/2014); Lower Extremity Angiography (Bilateral, 11/12/2017); ABDOMINAL AORTOGRAM (N/A, 11/12/2017); Endarterectomy femoral (Left, 11/17/2017); Wound exploration (Left, 11/28/2017); Application if wound vac (Left, 12/03/2017); ABDOMINAL AORTOGRAM W/LOWER EXTREMITY (Left, 02/10/2018); PERIPHERAL VASCULAR INTERVENTION (Left, 02/10/2018); and Femoral-popliteal Bypass Graft (Left, 02/11/2018).  Family History:  His family  history includes Alzheimer's disease (age of onset: 54) in his father; Other (age of onset: 63) in his mother; Stroke in his father and mother.  Social History:  He  reports that he has been smoking cigarettes. He started smoking about 61 years ago. He has a 90.00 pack-year smoking history. He has never used smokeless tobacco. He reports that he does not drink alcohol or use drugs.

## 2019-01-20 ENCOUNTER — Telehealth: Payer: Self-pay | Admitting: Cardiology

## 2019-01-20 NOTE — Telephone Encounter (Signed)
LVM, reminding pt of his appt with Dr Ellyn Hack on 01-21-19.

## 2019-01-21 ENCOUNTER — Other Ambulatory Visit: Payer: Self-pay

## 2019-01-21 ENCOUNTER — Encounter: Payer: Self-pay | Admitting: Cardiology

## 2019-01-21 ENCOUNTER — Ambulatory Visit (INDEPENDENT_AMBULATORY_CARE_PROVIDER_SITE_OTHER): Payer: Medicare Other | Admitting: Cardiology

## 2019-01-21 VITALS — BP 140/84 | HR 74 | Temp 97.0°F | Ht 72.0 in | Wt 183.6 lb

## 2019-01-21 DIAGNOSIS — I208 Other forms of angina pectoris: Secondary | ICD-10-CM

## 2019-01-21 DIAGNOSIS — E785 Hyperlipidemia, unspecified: Secondary | ICD-10-CM | POA: Diagnosis not present

## 2019-01-21 DIAGNOSIS — F17200 Nicotine dependence, unspecified, uncomplicated: Secondary | ICD-10-CM | POA: Diagnosis not present

## 2019-01-21 DIAGNOSIS — I25709 Atherosclerosis of coronary artery bypass graft(s), unspecified, with unspecified angina pectoris: Secondary | ICD-10-CM

## 2019-01-21 DIAGNOSIS — I779 Disorder of arteries and arterioles, unspecified: Secondary | ICD-10-CM | POA: Diagnosis not present

## 2019-01-21 DIAGNOSIS — I2510 Atherosclerotic heart disease of native coronary artery without angina pectoris: Secondary | ICD-10-CM

## 2019-01-21 DIAGNOSIS — I5032 Chronic diastolic (congestive) heart failure: Secondary | ICD-10-CM

## 2019-01-21 DIAGNOSIS — I10 Essential (primary) hypertension: Secondary | ICD-10-CM | POA: Diagnosis not present

## 2019-01-21 DIAGNOSIS — R0602 Shortness of breath: Secondary | ICD-10-CM

## 2019-01-21 NOTE — Progress Notes (Signed)
PCP: Mellody Dance, DO  Clinic Note: No chief complaint on file.   HPI: John Ball is a 78 y.o. male with a PMH below who presents today for delayed annual visit (delayed 2/2 COVID-19)) -- CAD-CABG, HTN, HLD, COPD (Centrilobular Emphysema). -- PAD (Dr. Donnetta Hutching)  Cardiac catheterization January 2016 revealed occluded native RCA and occluded vein graft RCA.  No PCI.  Recommended medical management.  Myoview Stress Test (September 12, 2017): EF 42%.  Inferior, inferoseptal defect consistent with scar.  No ischemia.  Read as Intermediate Risk because of low EF.  Echo ordered  Echo (September 26, 2017): EF 50-55%.  More accurate - Mild inferolateral HK (c/w known RCA occlusion).  GR 1 DD.  Mild LA dilation mildly increased PA pressures with mild RV/RA dilation.  [facility]  John Ball was last seen in April 2019 to follow-up from the stress test and echo noted above--> still noted off and on chest discomfort and shortness of breath, but was stabilized.  Usually takes aspirin as opposed nitroglycerin for chest pain.  Stated that he was pacing back to normal.  Recent Hospitalizations: None  Studies Personally Reviewed - (if available, images/films reviewed: From Epic Chart or Care Everywhere)  Myoview checked by [facility] ~ May -- NON-ischemic per report. --Unfortunately, report not available.  Interval History: John Ball is here today for delayed annual follow-up (delayed due to COVID-19)  No chest pain or shortness of breath with rest or exertion. No PND, orthopnea or edema. No palpitations, lightheadedness, dizziness, weakness or syncope/near syncope. No TIA/amaurosis fugax symptoms. No melena, hematochezia, hematuria, or epstaxis. No claudication.  ROS: A comprehensive was performed. Review of Systems  Constitutional: Positive for weight loss.  HENT: Positive for hearing loss (Very hard of hearing).   Respiratory: Positive for cough (Morning cough, does have  intermittent bouts of URI bronchitis.), shortness of breath (Baseline) and wheezing (Rarely uses his rescue inhaler.). Negative for sputum production.   Cardiovascular: Positive for claudication (Being followed by Dr. early.).  Gastrointestinal: Negative for blood in stool, heartburn and melena.  Genitourinary: Negative for hematuria.  Musculoskeletal: Positive for joint pain (Knees and hips).  Skin:       Recent bout of shingles about a month ago.  Still has some discomfort from a  Neurological: Positive for tingling (Peripheral neuropathy). Negative for dizziness and headaches.  Psychiatric/Behavioral: Positive for memory loss. The patient is not nervous/anxious and does not have insomnia.    The patient does not have symptoms concerning for COVID-19 infection (fever, chills, cough, or new shortness of breath).   He clearly has dyspnea and cough, but this is not new. The patient is practicing social distancing.   COVID-19 Education: The signs and symptoms of COVID-19 were discussed with the patient and how to seek care for testing (follow up with PCP or arrange E-visit).   The importance of social distancing was discussed today.   I have reviewed and (if needed) personally updated the patient's problem list, medications, allergies, past medical and surgical history, social and family history.   Past Medical History:  Diagnosis Date   Anxiety    Arthritis    Complication of anesthesia    hx prolonged post op oxygen dependent/  hx hallucinations for 2 day post op lobectomy 2012   COPD, severe ([place])    Depression    Essential hypertension    Hearing loss of both ears    does wear his hearing aids  History of arterial bypass of lower extremity    RIGHT FEM-POP   History of CHF (congestive heart failure)    History of DVT of lower extremity    History of panic attacks    Hyperlipidemia with target LDL less than 70    Left main coronary artery disease 2004   a) Severe  LM CAD 2004 -->s/p  cabg x4 (LIMA-LAD, free RIMA-RI, SVG-OM 2, SVG-RPDA); b) 06/2014: Abnormal Myoview --> c)cardiac Cath Jan 2016: pLM 70%, pLAD 40-50%- patent LIMA-LAD.  RI -competitive flow noted from RIMA-RI graft, circumflex, normal caliber with moderate OM1.  OM 2 occluded.  SVG-OM 2 patent. 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    does not wear his dentures   Nocturia    OSA (obstructive sleep apnea)    uses O2  via [place]  --  intolerate to CPAP   Peripheral arterial occlusive disease ([place])    2011   a) Gore-Tex graft right AK popA-BK popA --> b) 2012: thrombosed graft --> thrombectomy with dacron patch = 1 V runnof via Peroneal; c) 01/2014: R femoropopliteal bypass with left femoral vein;; followed by dr Early--  per last dopplers 06-28-2014 no change right graft,  50-74% stenosis common and left mid superficial femoral artery   PTSD (post-traumatic stress disorder)    anxiety attack's---  [place] Vet   Recurrent productive cough    SMOKER'S COUGH   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--- a) 04/2011: R L Lobectomy & med node dissection ([id number]) - [facility] [place], [place]) w/o post-op Rx; b) CT-A Chest Jan 2013: R pl effusion, R hilar LAN (3.3 cm x 2.8 cm & 2.2 cm x 1.3 cm); c) 2/'13 PET-CT Chest: Bilat Hilar LAN, no distant Mets  d) CT chest 9/'14: Stable shotty hilar nodes bilaterally w/ no pathologic sized LAD or suspicious Pulm nodule; ;;  (oncologist at   Wears glasses     Past Surgical History:  Procedure Laterality Date   ABDOMINAL  AORTOGRAM N/A 11/12/2017   Procedure: ABDOMINAL AORTOGRAM;  Surgeon: Waynetta Sandy, MD;  Location: [facility];  Service: Cardiovascular;  Laterality: N/A;   ABDOMINAL AORTOGRAM W/LOWER EXTREMITY Left 02/10/2018   Procedure: ABDOMINAL AORTOGRAM W/LOWER EXTREMITY;  Surgeon: Serafina Mitchell, MD;  Location: [facility];  Service: Cardiovascular;  Laterality: Left;   APPLICATION OF WOUND VAC Left 12/03/2017   Procedure: EXCHANGE OF WOUND VAC LEFT GROIN,;  Surgeon: Rosetta Posner, MD;  Location: [facility];  Service: Vascular;  Laterality: Left;   CARDIAC CATHETERIZATION  07-13-2002   Ischemia RCA region on Cardiolite// 60-70% ostial left main, 50% midCx, 60-70% mid RI,  70% mRCA, 90% JeNu and crux 75% RCA ,  95% PLA//  severe LM and 3Vessel CAD, perserved LV, ef 60%   CARDIOVASCULAR STRESS TEST  last one  06-17-2014   dr  Eulah Citizen study with no exercise; Intermediate Risk Study;   moderate size and intensity, partially reversible inferior  septal defect consistent with prior infarct and mild to moderate peri-infarct ischemia;  mild hypokinesis of the inferior septal wall,  normal LV function, ef 56%   COLONOSCOPY  2008    WNL   CORONARY ARTERY BYPASS GRAFT  07/27/2002   LIMA-LAD, freeRIMA-RI,SVG-OM2, SVG-rPDA; Dr. Roxan Hockey   CYSTOSCOPY W/ URETERAL STENT PLACEMENT N/A 02/21/2015   Procedure: BILATERAL RETROGRADE PYELOGRAM;  Surgeon: Festus Aloe, MD;  Location: [facility];  Service: Urology;  Laterality: N/A;   ENDARTERECTOMY FEMORAL Left 11/17/2017   Procedure: LEFT Femoral Endarterectomy with Patch Angioplasty;  Surgeon: Rosetta Posner, MD;  Location: [facility];  Service: Vascular;  Laterality: Left;   EYE SURGERY Left Nov 2013   FEMORAL ARTERY - POPLITEAL ARTERY BYPASS GRAFT Right 10-24-2008   dr early   w/ GoreTex graft   FEMORAL-POPLITEAL BYPASS GRAFT Left 02/11/2018   Procedure: BYPASS GRAFT LOWER EXTREMITY;  Surgeon: Rosetta Posner, MD;   [facility]: [facility];  Service: Vascular;  Laterality: Left;   FEMORAL-TIBIAL BYPASS GRAFT Right 01/10/2014   Procedure: Right Femoral to Posterior Tibial Bypass Graft using Left Leg Vein, Thrombectomy Right Common Femoral and Profunda of Leg . ;  Surgeon: Rosetta Posner, MD;  Location: [facility];  Service: Vascular;  Laterality: Right;   HYDROCELE EXCISION Left    LEFT ANKLE SURGERY  1990's   LEFT HEART CATHETERIZATION WITH CORONARY/GRAFT ANGIOGRAM N/A 07/12/2014   Procedure: LEFT HEART CATHETERIZATION WITH Beatrix Fetters;  Surgeon: Troy Sine, MD;  Location: [facility];  Service: Cardiovascular;  Laterality: N/A;  pLM 70%, pLAD 40-50%- patent LIMA-LAD.  RI -competitive flow noted from fRIMA-RI graft, circumflex, normal caliber with moderate OM1.  OM 2 occluded.  SVG-OM 2 patent. RCA CTO after large RVM, faint R-R and LAD septal-PDA collateral   LOWER EXTREMITY ANGIOGRAPHY Bilateral 11/12/2017   Procedure: LOWER EXTREMITY ANGIOGRAPHY - Left Lower;  Surgeon: Waynetta Sandy, MD;  Location: [facility];  Service: Cardiovascular;  Laterality: Bilateral;   NM MYOVIEW LTD  11/'15; 3/'19, 5/'20   a) Mod sized, mod intensity/partially reversible inferior defect c/w  prior infarct mild/mod peri-infarct ischemia.  Inferoseptal HK.  INTERMEDIATE RISK. --> Cath showed CTO of Native RCA & SVG-rPDA.; b) EF ~42%. Inferior-Inferoseptal defect - likely old scar - INTERMEDIATE RISK b/c low EF (Echo EF 50-55%) - med Rx; c) [place] -by report - Non-ischemic.   PERIPHERAL VASCULAR INTERVENTION Left 02/10/2018   Procedure: PERIPHERAL VASCULAR INTERVENTION;  Surgeon: Serafina Mitchell, MD;  Location: [facility];  Service: Cardiovascular;  Laterality: Left;  left common iliac stent, attempted left SFA stent   RIGHT ABOVE KNEE POPLITEAL GRAFT TO BELOW KNEE POPLITEAL ARTERY BYPASS WITH REVERSE SAPHENOUS VEIN  03-02-2010   THROMBECTOMY OF RIGHT FEMORAL TO ABOVE KNEE POPLITEAL GORETEX GRAFT  ANGIOPLASTY OF GORETEX AND SAPHENOUS VEIN JUNCTION AND ABOVE KNEE POPLITEAL ARTERY   10-06-2010   DR EARLY   TONSILLECTOMY     TRANSTHORACIC ECHOCARDIOGRAM  09/2017   EF 50-55%.  More accurate - Mild inferolateral HK (c/w known RCA occlusion).  GR 1 DD.  Mild LA dilation mildly increased PA pressures with mild RV/RA dilation.   TRANSURETHRAL RESECTION OF BLADDER TUMOR N/A 02/21/2015   Procedure: TRANSURETHRAL RESECTION OF BLADDER TUMOR (TURBT);  Surgeon: Festus Aloe, MD;  Location[facility];  Service: Urology;  Laterality: N/A;   VIDEO ASSISTED THORACOSCOPY (VATS)/WEDGE RESECTION Right 05-01-2011    FORSYTH   LOWER LOBECTOMY W/  NODE DISSECTION  WOUND EXPLORATION Left 11/28/2017   Procedure: WOUND EXPLORATION GROIN, WASHOUT AND WOUND VAC APPLICATION;  Surgeon: Conrad Gloverville, MD;  Location: [facility];  Service: Vascular;  Laterality: Left;    Current Meds  Medication Sig   acetaminophen (TYLENOL) 500 MG tablet Take 1,000 mg by mouth every 6 (six) hours as needed for moderate pain or headache.    ADVAIR DISKUS 500-50 MCG/DOSE AEPB Inhale 1 puff into the lungs 2 (two) times daily.    albuterol (PROVENTIL HFA;VENTOLIN HFA) 108 (90 BASE) MCG/ACT inhaler Inhale 1 puff into the lungs every 6 (six) hours as needed for wheezing or shortness of breath.   albuterol (PROVENTIL) (2.5 MG/3ML) 0.083% nebulizer solution    Carbomer Gel Base (HYDROGEL) GEL 1 g by Does not apply route daily.   Cholecalciferol (VITAMIN D3) 1000 UNITS CAPS Take 1,000 Units by mouth every morning.    DULoxetine (CYMBALTA) 30 MG capsule Take 30-60 mg by mouth See admin instructions. 60mg  (2 capsules) every morning and 30mg  (1 capsule) daily at evening   lidocaine (LIDODERM) 5 % Place 3 patches onto the skin every 12 (twelve) hours. Remove & Discard patch within 12 hours or as directed by MD   nitroGLYCERIN (NITROSTAT) 0.4 MG SL tablet Place 1 tablet (0.4 mg total) under the tongue every 5 (five)  minutes as needed for chest pain.   OXYGEN Inhale 2 L into the lungs See admin instructions. Uses 2L at bedtime and if needed throughout the day -- intolerant to CPAP   prazosin (MINIPRESS) 1 MG capsule Take 1 mg by mouth at bedtime.   pregabalin (LYRICA) 75 MG capsule Take 1 capsule (75 mg total) by mouth 3 (three) times daily.   QUEtiapine (SEROQUEL XR) 300 MG 24 hr tablet Take 300 mg by mouth at bedtime.    ramipril (ALTACE) 2.5 MG capsule Take 2.5 mg by mouth every morning.    tiotropium (SPIRIVA HANDIHALER) 18 MCG inhalation capsule Place 18 mcg into inhaler and inhale daily.   traZODone (DESYREL) 150 MG tablet Take 0.5 tablets (75 mg total) by mouth at bedtime as needed for sleep. (Patient taking differently: Take 150 mg by mouth at bedtime. )   zolpidem (AMBIEN CR) 12.5 MG CR tablet Take 12.5 mg by mouth at bedtime.   --Amlodipine was discontinued because of low blood pressures from prazosin  Allergies  Allergen Reactions   Aspirin Other (See Comments)    Avoids due to severe bruises   Crestor [Rosuvastatin Calcium] Other (See Comments)    myalgia   Lipitor [Atorvastatin Calcium] Other (See Comments)    myalgia   Zetia [Ezetimibe] Other (See Comments)    myalgia   Zocor [Simvastatin - High Dose] Other (See Comments)    myalgia   Aripiprazole Other (See Comments)    Altered mental status   Effexor [Venlafaxine Hydrochloride] Itching   Morphine And Related Itching    Social History   Tobacco Use   Smoking status: Current Every Day Smoker    Packs/day: 1.50    Years: 60.00    Pack years: 90.00    Types: Cigarettes    Start date: 09/14/1957   Smokeless tobacco: Never Used   Tobacco comment: 1/2 pk per day 01/14/19  Substance Use Topics   Alcohol use: No   Drug use: No   Social History   Social History Narrative   He is married with 2 children. Does not exercise regularly.   He and his wife have been recently living in [place], [place] (but just  moved back to [place], [place] in November 2018).   He still smokes about a half-pack a day and is not interested in quitting. No alcohol or recreational drug use.      I confirmed with him and his wife that he does have a DNR order signed and displayed.       family history includes Alzheimer's disease (age of onset: 8) in his father; Other (age of onset: 69) in his mother; Stroke in his father and mother.  Wt Readings from Last 3 Encounters:  01/21/19 183 lb 9.6 oz (83.3 kg)  01/14/19 185 lb (83.9 kg)  01/04/19 186 lb (84.4 kg)    PHYSICAL EXAM BP 140/84    Pulse 74    Temp (!) 97 F (36.1 C)    Ht 6' (1.829 m)    Wt 183 lb 9.6 oz (83.3 kg)    SpO2 100%    BMI 24.90 kg/m  Physical Exam  Constitutional: He is oriented to person, place, and time. No distress.  Does appear older than stated age.  Well-groomed.  Smells like cigarettes.  HENT:  Head: Normocephalic and atraumatic.  Poor dentition  Neck: Normal range of motion. Neck supple. No hepatojugular reflux and no JVD present. Carotid bruit is not present.  Cardiovascular: Normal rate, regular rhythm and normal heart sounds.  Occasional extrasystoles are present. PMI is not displaced. Exam reveals decreased pulses (decreased but palpable pedal pulses; L radial pulse barely palpable). Exam reveals no gallop and no friction rub.  No murmur heard. Pulmonary/Chest: No respiratory distress. He has wheezes (Expiratory wheezing). He has rales (No rhonchi - coarse interstitial sounds). He exhibits no tenderness.  Baseline use of accessory muscles.  Abdominal: Soft. Bowel sounds are normal. He exhibits no distension. There is no abdominal tenderness. There is no rebound.  Musculoskeletal: Normal range of motion.        General: Edema (Mild lower extremity puffy ankle edema) present.  Neurological: He is alert and oriented to person, place, and time.  Psychiatric: He has a normal mood and affect. His behavior is normal. Thought content  normal.  Stubborn, stoic  Vitals reviewed.     Adult ECG Report  Rate: 74;  Rhythm: premature ventricular contractions (PVC) and Nonspecific T wave changes.;  Normal axis, intervals and durations  Narrative Interpretation: Relatively stable.   Other studies Reviewed: Additional studies/ records that were reviewed today include:  Recent Labs:   --Labs checked by [place], not available.   ASSESSMENT / PLAN: Problem List Items Addressed This Visit    SOB (shortness of breath)    Most c/w COPD -- clearly has diastolic dysfunction, but no true CHF symptoms.        Smoker unmotivated to quit (Chronic)    NOT interested in discussion -- he understands the "risks" & states that the "damage is already done".      Peripheral arterial occlusive disease (HCC) (Chronic)   Relevant Orders   EKG 12-Lead (Completed)   Lipid panel   Comprehensive metabolic panel   Left main coronary artery disease (Chronic)    Severe native disease in the left main disease.  Unfortunately, he has not asked for smoking cessation so we did not discuss that.  He is more limited now because of his COPD much more so than his coronary disease.    He really only minimal improvement in treatment.  Thankfully, his stress test have been negative.  Is only on ACE inhibitor.  Now no longer on amlodipine.  Not on statin because of intolerance, and has no desire to try another medication.  I have asked him to go back to taking aspirin 81 mg at least every other day.      Relevant Orders   EKG 12-Lead (Completed)   Comprehensive metabolic panel   Hyperlipidemia with target LDL less than 70 (Chronic)    Has been unable / unwilling to take statins & Zetia. Last labs I have are > 1 yr old - LDL 97.   He is reluctant to try other medications. Will recheck labs (Lipids/CMP) --> may consider Nexletol vs. PCSK9-I (will ask [facility] Pharmacist contact him after results to discuss).      Relevant Orders   EKG 12-Lead (Completed)    Lipid panel   Comprehensive metabolic panel   Essential hypertension (Chronic)    SBP is usually in 120s @ home.  Has orthostatic hypotension & Amlodipine d/c'd with the addition of prazosin.  -- reluctant to restart amlodipine, but could consider Diltiazem XT 120 mg if BP will tolerate.      Relevant Orders   EKG 12-Lead (Completed)   Coronary artery disease involving coronary bypass graft of native heart with angina pectoris ([place]) - Primary (Chronic)   Relevant Orders   EKG 12-Lead (Completed)   Lipid panel   Comprehensive metabolic panel   Chronic diastolic heart failure (HCC) (Chronic)    Seems euvolemic on exam.  No PND orthopnea just dyspnea.  Is on ACE inhibitor.  Has preserved EF and can therefore tolerate diltiazem.      Atypical angina ([place])    He has unusual chest pain symptoms which based on multiple stress tests and even a relook cath do not seem to be truly anginal in nature.  He never really had normal anginal symptoms in the past so is hard to tell what is real.  He just had another Myoview stress test for symptoms that are probably more musculoskeletal.  He stopped taking Imdur and is no longer taking amlodipine because of hypotension. I would actually probably prefer him being on amlodipine as opposed to ACE inhibitor if he were to have actually active anginal symptoms.  Not on beta-blocker is her COPD.  We could consider diltiazem as an option as it would cause less hypotension.      Relevant Orders   EKG 12-Lead (Completed)   Comprehensive metabolic panel      I spent a total of 30 minutes with the patient and chart review. >  50% of the time was spent in direct patient consultation.   Current medicines are reviewed at length with the patient today.  (+/- concerns) n/a The following changes have been made:  n/a  Patient Instructions  Medication Instructions:  No changes  If you need a refill on your cardiac medications before your next appointment,  please call your pharmacy.   Lab work: cmp , lipid - please do  on a day when you have not had anything to eat or drink that morning   If you have labs (blood work) drawn today and your tests are completely normal, you will receive your results only by:  [place] (if you have MyChart) OR  A paper copy in the mail If you have any lab test that is abnormal or we need to change your treatment, we will call you to review the results.  Testing/Procedures:  Not needed  Follow-Up: At [facility], you and your health needs  are our priority.  As part of our continuing mission to provide you with exceptional heart care, we have created designated Provider Care Teams.  These Care Teams include your primary Cardiologist (physician) and Advanced Practice Providers (APPs -  Physician Assistants and Nurse Practitioners) who all work together to provide you with the care you need, when you need it.  You will need a follow up appointment in  July 2021.  Please call our office 2 months in advance to schedule this appointment.  You may see Glenetta Hew, MD or one of the following Advanced Practice Providers on your designated Care Team:    Rosaria Ferries, PA-C  Jory Sims, DNP, ANP  Any Other Special Instructions Will Be Listed Below (If Applicable).    Studies Ordered:   Orders Placed This Encounter  Procedures   Lipid panel   Comprehensive metabolic panel   EKG 31-UCJA      Glenetta Hew, M.D., M.S. Interventional Cardiologist   Pager # [phone] Phone # [phone] [address] [place], [place] [address]   Thank you for choosing [facility] at [facility]!!

## 2019-01-21 NOTE — Patient Instructions (Signed)
Medication Instructions:  No changes  If you need a refill on your cardiac medications before your next appointment, please call your pharmacy.   Lab work: cmp , lipid - please do  on a day when you have not had anything to eat or drink that morning   If you have labs (blood work) drawn today and your tests are completely normal, you will receive your results only by: [place] MyChart Message (if you have MyChart) OR . A paper copy in the mail If you have any lab test that is abnormal or we need to change your treatment, we will call you to review the results.  Testing/Procedures:  Not needed  Follow-Up: At [facility], you and your health needs are our priority.  As part of our continuing mission to provide you with exceptional heart care, we have created designated Provider Care Teams.  These Care Teams include your primary Cardiologist (physician) and Advanced Practice Providers (APPs -  Physician Assistants and Nurse Practitioners) who all work together to provide you with the care you need, when you need it. . You will need a follow up appointment in  July 2021.  Please call our office 2 months in advance to schedule this appointment.  You may see Glenetta Hew, MD or one of the following Advanced Practice Providers on your designated Care Team:   . Rosaria Ferries, PA-C . Jory Sims, DNP, ANP  Any Other Special Instructions Will Be Listed Below (If Applicable).

## 2019-01-23 ENCOUNTER — Encounter: Payer: Self-pay | Admitting: Cardiology

## 2019-01-23 NOTE — Assessment & Plan Note (Addendum)
Severe native disease in the left main disease.  Unfortunately, he has not asked for smoking cessation so we did not discuss that.  He is more limited now because of his COPD much more so than his coronary disease.    He really only minimal improvement in treatment.  Thankfully, his stress test have been negative.  Is only on ACE inhibitor.  Now no longer on amlodipine.  Not on statin because of intolerance, and has no desire to try another medication.  I have asked him to go back to taking aspirin 81 mg at least every other day.

## 2019-01-23 NOTE — Assessment & Plan Note (Signed)
He has unusual chest pain symptoms which based on multiple stress tests and even a relook cath do not seem to be truly anginal in nature.  He never really had normal anginal symptoms in the past so is hard to tell what is real.  He just had another Myoview stress test for symptoms that are probably more musculoskeletal.  He stopped taking Imdur and is no longer taking amlodipine because of hypotension. I would actually probably prefer him being on amlodipine as opposed to ACE inhibitor if he were to have actually active anginal symptoms.  Not on beta-blocker is her COPD.  We could consider diltiazem as an option as it would cause less hypotension.

## 2019-01-23 NOTE — Assessment & Plan Note (Signed)
Has been unable / unwilling to take statins & Zetia. Last labs I have are > 76 yr old - LDL 97.   He is reluctant to try other medications. Will recheck labs (Lipids/CMP) --> may consider Nexletol vs. PCSK9-I (will ask [facility] Pharmacist contact him after results to discuss).

## 2019-01-23 NOTE — Assessment & Plan Note (Signed)
SBP is usually in 120s @ home.  Has orthostatic hypotension & Amlodipine d/c'd with the addition of prazosin.  -- reluctant to restart amlodipine, but could consider Diltiazem XT 120 mg if BP will tolerate.

## 2019-01-23 NOTE — Assessment & Plan Note (Signed)
Most c/w COPD -- clearly has diastolic dysfunction, but no true CHF symptoms.

## 2019-01-23 NOTE — Assessment & Plan Note (Signed)
Seems euvolemic on exam.  No PND orthopnea just dyspnea.  Is on ACE inhibitor.  Has preserved EF and can therefore tolerate diltiazem.

## 2019-01-23 NOTE — Assessment & Plan Note (Signed)
NOT interested in discussion -- he understands the "risks" & states that the "damage is already done".

## 2019-02-03 ENCOUNTER — Encounter: Payer: Self-pay | Admitting: Family Medicine

## 2019-02-03 ENCOUNTER — Other Ambulatory Visit: Payer: Self-pay

## 2019-02-03 ENCOUNTER — Ambulatory Visit (INDEPENDENT_AMBULATORY_CARE_PROVIDER_SITE_OTHER): Payer: Medicare Other | Admitting: Family Medicine

## 2019-02-03 VITALS — BP 125/78 | HR 74 | Temp 97.7°F | Ht 72.0 in | Wt 188.7 lb

## 2019-02-03 DIAGNOSIS — B028 Zoster with other complications: Secondary | ICD-10-CM

## 2019-02-03 DIAGNOSIS — Z91199 Patient's noncompliance with other medical treatment and regimen due to unspecified reason: Secondary | ICD-10-CM

## 2019-02-03 DIAGNOSIS — B0229 Other postherpetic nervous system involvement: Secondary | ICD-10-CM | POA: Diagnosis not present

## 2019-02-03 DIAGNOSIS — Z9119 Patient's noncompliance with other medical treatment and regimen: Secondary | ICD-10-CM | POA: Diagnosis not present

## 2019-02-03 DIAGNOSIS — I779 Disorder of arteries and arterioles, unspecified: Secondary | ICD-10-CM

## 2019-02-03 NOTE — Progress Notes (Signed)
Impression and Recommendations:    1. Postherpetic neuralgia   2. Herpes zoster with other complication   3. Personal history of noncompliance with medical treatment     Postherpetic neuralgia - - RF Lidoderm patches  - RF Lyrica 75mg  TID for potsherpetic neuralgia.  - Per wife, patient's condition has improved and treatment is working well.  - Continue treatment plan as prescribed and hold the course on management.  See med list.  - Discussed that patient's condition should slowly continue to improve, especially as he continues to take his Lyrica three times per day and switches out his patches every 12 hours.  - Patient has not had a shingles vaccine.  - Strongly advised patient to obtain shingles vaccine as soon as rash cleared- he will technically be eligible.   I recommend it and we discussed vaccinations today.   - Need in future for Medicare Wellness TeleHealth and fasting blood work same day or couple days later.  Gross side effects, risk and benefits, and alternatives of medications and treatment plan in general discussed with patient.  Patient is aware that all medications have potential side effects and we are unable to predict every side effect or drug-drug interaction that may occur.   Patient will call with any questions prior to using medication if they have concerns.    Expresses verbal understanding and consents to current therapy and treatment regimen.  No barriers to understanding were identified.  Red flag symptoms and signs discussed in detail.  Patient expressed understanding regarding what to do in case of emergency\urgent symptoms  Please see AVS handed out to patient at the end of our visit for further patient instructions/ counseling done pertaining to today's office visit.   Return for medicare wellness- telehealth and also FBW same day or couple d later.     Note:  This note was prepared with assistance of Dragon voice recognition software.  Occasional wrong-word or sound-a-like substitutions may have occurred due to the inherent limitations of voice recognition software.   This document serves as a record of services personally performed by Mellody Dance, DO. It was created on her behalf by Toni Amend, a trained medical scribe. The creation of this record is based on the scribe's personal observations and the provider's statements to them.   I have reviewed the above medical documentation for accuracy and completeness and I concur.  Mellody Dance, DO 02/03/2019 9:00 PM        --------------------------------------------------------------------------------------------------------------------------------------------------------------------------------------------------------------------------------------------    Subjective:     HPI: John Ball is a 78 y.o. male who presents to [place] [facility] today for issues as discussed below.  Postherpetic Neuralgia Since last visit, after being placed on Lidoderm patches as well as lyrica for potsherpetic neuralgia, patient says that his pain is still bad.  Patient is accompanied by wife and she states that while he looks better, he still complains that his pain hurts.  Wife says "he's not complaining as much as he was," which to her seems to indicate that he's doing better.  Patient confirms that he's taking 75 mg Lyrica three times per day.  Patient's wife states he's wearing 3 Lidoderm patches every 12 hours.  Patient states his pain mainly bothers him mid-afternoon and then on until bedtime.  When patient is rubbed gently over his chest, he denies worsening pain.  Wife continues to discuss that she feels patient has improved.   Wt Readings from Last  3 Encounters:  02/03/19 188 lb 11.2 oz (85.6 kg)  01/21/19 183 lb 9.6 oz (83.3 kg)  01/14/19 185 lb (83.9 kg)   BP Readings from Last 3 Encounters:  02/09/19 125/78  02/03/19 125/78    01/21/19 140/84   Pulse Readings from Last 3 Encounters:  02/09/19 90  02/03/19 74  01/21/19 74   BMI Readings from Last 3 Encounters:  02/03/19 25.59 kg/m  01/21/19 24.90 kg/m  01/14/19 25.09 kg/m     Patient Care Team    Relationship Specialty Notifications Start End  Mellody Dance, DO PCP - General Family Medicine  05/07/17      Patient Active Problem List   Diagnosis Date Noted   Atypical angina ([place]) 08/28/2017    Priority: High   COPD exacerbation ([place]) 07/21/2016    Priority: High   Coronary artery disease involving coronary bypass graft of native heart with angina pectoris [facility])     Priority: High   Peripheral arterial occlusive disease ([place])     Priority: High   Smoker unmotivated to quit     Priority: High   Essential hypertension     Priority: High   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/2002    Priority: High   OSA (obstructive sleep apnea) 09/10/2017    Priority: Medium   Bipolar I disorder ([place]) 05/07/2017    Priority: Medium   Major depression, recurrent ([place]) 07/21/2016    Priority: Medium   Hyperlipidemia with target LDL less than 70     Priority: Medium   Lung cancer ([place]) 05/15/2011    Priority: Medium   PTSD (post-traumatic stress disorder) 09/10/2017    Priority: Low   No natural teeth 09/10/2017    Priority: Low   Hearing loss of both ears 09/10/2017    Priority: Low   Squamous cell lung cancer ([place]) 05/05/2011    Priority: Low   Postherpetic neuralgia 03/05/2019   Shingles 03/05/2019   Personal history of noncompliance with medical treatment 12/07/2018   Chronic respiratory failure with hypoxia ([place]) 09/15/2018   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)    post op Wound infection 11/28/2017   Sepsis ([place]) 11/28/2017   PAD (peripheral artery disease) ([place]) 11/17/2017   CAP (community acquired pneumonia) 09/22/2017   SOB (shortness of breath) 09/12/2017   Radial artery occlusion,  left ([place]) 08/28/2017   Acute on chronic respiratory failure with hypoxia (HCC) 07/21/2016   Chronic diastolic heart failure ([place]) 07/21/2016   Abnormal stress test    Chest pain at rest 06/06/2014   COPD (chronic obstructive pulmonary disease) (HCC)    Pain of left lower extremity 02/01/2014   Left main coronary artery disease 07/08/2002    Past Medical history, Surgical history, Family history, Social history, Allergies and Medications have been entered into the medical record, reviewed and changed as needed.    Current Meds  Medication Sig   acetaminophen (TYLENOL) 500 MG tablet Take 1,000 mg by mouth every 6 (six) hours as needed for moderate pain or headache.    ADVAIR DISKUS 500-50 MCG/DOSE AEPB Inhale 1 puff into the lungs 2 (two) times daily.    albuterol (PROVENTIL HFA;VENTOLIN HFA) 108 (90 BASE) MCG/ACT inhaler Inhale 1 puff into the lungs every 6 (six) hours as needed for wheezing or shortness of breath.   albuterol (PROVENTIL) (2.5 MG/3ML) 0.083% nebulizer solution    Carbomer Gel Base (HYDROGEL) GEL 1 g by Does not apply route daily.   Cholecalciferol (VITAMIN D3) 1000  UNITS CAPS Take 1,000 Units by mouth every morning.    DULoxetine (CYMBALTA) 30 MG capsule Take 60 mg by mouth daily. 60mg  (2 capsules) every morning   nitroGLYCERIN (NITROSTAT) 0.4 MG SL tablet Place 1 tablet (0.4 mg total) under the tongue every 5 (five) minutes as needed for chest pain. (Patient not taking: Reported on 02/09/2019)   OXYGEN Inhale 2 L into the lungs See admin instructions. Uses 2L at bedtime and if needed throughout the day -- intolerant to CPAP   prazosin (MINIPRESS) 1 MG capsule Take 1 mg by mouth at bedtime.   pregabalin (LYRICA) 75 MG capsule Take 1 capsule (75 mg total) by mouth 3 (three) times daily.   QUEtiapine (SEROQUEL XR) 300 MG 24 hr tablet Take 300 mg by mouth at bedtime.    ramipril (ALTACE) 2.5 MG capsule Take 2.5 mg by mouth every morning.    tiotropium  (SPIRIVA HANDIHALER) 18 MCG inhalation capsule Place 18 mcg into inhaler and inhale daily.   traZODone (DESYREL) 150 MG tablet Take 0.5 tablets (75 mg total) by mouth at bedtime as needed for sleep. (Patient taking differently: Take 150 mg by mouth at bedtime. )   zolpidem (AMBIEN CR) 12.5 MG CR tablet Take 12.5 mg by mouth at bedtime.   [DISCONTINUED] lidocaine (LIDODERM) 5 % Place 3 patches onto the skin every 12 (twelve) hours. Remove & Discard patch within 12 hours or as directed by MD    Allergies:  Allergies  Allergen Reactions   Aspirin Other (See Comments)    Avoids due to severe bruises   Crestor [Rosuvastatin Calcium] Other (See Comments)    myalgia   Lipitor [Atorvastatin Calcium] Other (See Comments)    myalgia   Zetia [Ezetimibe] Other (See Comments)    myalgia   Zocor [Simvastatin - High Dose] Other (See Comments)    myalgia   Aripiprazole Other (See Comments)    Altered mental status   Effexor [Venlafaxine Hydrochloride] Itching   Morphine And Related Itching     Review of Systems:  A fourteen system review of systems was performed and found to be positive as per HPI.   Objective:   Blood pressure 125/78, pulse 74, temperature 97.7 F (36.5 C), height 6' (1.829 m), weight 188 lb 11.2 oz (85.6 kg), SpO2 100 %. Body mass index is 25.59 kg/m. General:  Well Developed, well nourished, appropriate for stated age.  Neuro:  Alert and oriented,  extra-ocular muscles intact  HEENT:  Normocephalic, atraumatic, neck supple, no carotid bruits appreciated  Skin: Well healing herpetic skin lesions in the dermatomal distribution of C5-T1 on the right thorax.  Never crosses midline. warm, pink. Cardiac:  RRR, S1 S2 Respiratory:  ECTA B/L and A/P, Not using accessory muscles, speaking in full sentences- unlabored. Vascular:  Ext warm, no cyanosis apprec.; cap RF less 2 sec. Psych:  Much improvement with his irritability and much less agitated than prior.

## 2019-02-03 NOTE — Patient Instructions (Signed)
Postherpetic Neuralgia Postherpetic neuralgia (PHN) is nerve pain that occurs after a shingles infection. Shingles is a painful rash that appears on one area of the body, usually on the trunk or face. Shingles is caused by the varicella-zoster virus. This is the same virus that causes chickenpox. In people who have had chickenpox, the virus can resurface years later and cause shingles. You may have PHN if you continue to have pain for 4 months after your shingles rash has gone away. PHN appears in the same area where you had the shingles rash. The pain usually goes away after the rash disappears. Getting a vaccination for shingles can prevent PHN. This vaccine is recommended for people older than 60. It may prevent shingles, and may also lower your risk of PHN if you do get shingles. What are the causes? This condition is caused by damage to your nerves from the varicella-zoster virus. The damage makes your nerves overly sensitive. What increases the risk? The following factors may make you more likely to develop this condition:  Being older than 76 years of age.  Having severe pain before your shingles rash starts.  Having a severe rash.  Having shingles in and around the eye area.  Having a disease that makes your body unable to fight infections (weak immune system). What are the signs or symptoms? The main symptom of this condition is pain. The pain may:  Often be very bad and may be described as stabbing, burning, or feeling like an electric shock.  Come and go or may be there all the time.  Be triggered by light touches on the skin or changes in temperature. You may have itching along with the pain. How is this diagnosed? This condition may be diagnosed based on your symptoms and your history of shingles. Lab studies and other diagnostic tests are usually not needed. How is this treated? There is no cure for this condition. Treatment for PHN will focus on pain relief.  Over-the-counter pain relievers do not usually relieve PHN pain. You may need to work with a pain specialist. Treatment may include:  Antidepressant medicines to help with pain and improve sleep.  Anti-seizure medicines to relieve nerve pain.  Strong pain relievers (opioids).  A numbing patch worn on the skin (lidocaine patch).  Botox (botulinum toxin) injections to block pain signals between nerves and muscles.  Injections of numbing medicine or anti-inflammatory medicines around irritated nerves. Follow these instructions at home:   It may take a long time to recover from PHN. Work closely with your health care provider and develop a good support system at home.  Take over-the-counter and prescription medicines only as told by your health care provider.  Do not drive or use heavy machinery while taking prescription pain medicine.  Wear loose, comfortable clothing.  Cover sensitive areas with a dressing to reduce friction from clothing rubbing on the area.  If directed, put ice on the painful area: ? Put ice in a plastic bag. ? Place a towel between your skin and the bag. ? Leave the ice on for 20 minutes, 2-3 times a day.  Talk to your health care provider if you feel depressed or desperate. Living with long-term pain can be depressing.  Keep all follow-up visits as told by your health care provider. This is important. Contact a health care provider if:  Your medicine is not helping.  You are struggling to manage your pain at home. Summary  Postherpetic neuralgia is a very painful disorder   that can occur after an episode of shingles.  The pain is often severe, burning, electric, or stabbing.  Prescription medicines can be helpful in managing persistent pain.  Getting a vaccination for shingles can prevent PHN. This vaccine is recommended for people older than 60. This information is not intended to replace advice given to you by your health care provider. Make sure  you discuss any questions you have with your health care provider. Document Released: 09/14/2002 Document Revised: 06/06/2017 Document Reviewed: 09/10/2016 Elsevier Patient Education  2020 Elsevier Inc.  

## 2019-02-08 ENCOUNTER — Telehealth (HOSPITAL_COMMUNITY): Payer: Self-pay

## 2019-02-08 ENCOUNTER — Other Ambulatory Visit: Payer: Self-pay

## 2019-02-08 DIAGNOSIS — I779 Disorder of arteries and arterioles, unspecified: Secondary | ICD-10-CM

## 2019-02-08 NOTE — Telephone Encounter (Signed)
The above patient or their representative was contacted and gave the following answers to these questions:         Do you have any of the following symptoms?No Fever                    Cough                   Shortness of breath  Do  you have any of the following other symptoms?    muscle pain         vomiting,        diarrhea        rash         weakness        red eye        abdominal pain         bruising          bruising or bleeding              joint pain           severe headache    Have you been in contact with someone who was or has been sick in the past 2 weeks? No  Yes                 Unsure                         Unable to assess   Does the person that you were in contact with have any of the following symptoms?   Cough         shortness of breath           muscle pain         vomiting,            diarrhea            rash            weakness           fever            red eye           abdominal pain           bruising  or  bleeding                joint pain                severe headache

## 2019-02-09 ENCOUNTER — Ambulatory Visit (INDEPENDENT_AMBULATORY_CARE_PROVIDER_SITE_OTHER): Payer: Medicare Other | Admitting: Vascular Surgery

## 2019-02-09 ENCOUNTER — Ambulatory Visit (HOSPITAL_COMMUNITY)
Admission: RE | Admit: 2019-02-09 | Discharge: 2019-02-09 | Disposition: A | Discharge status: Home / Self Care | Payer: Medicare Other | Source: Ambulatory Visit | Attending: Vascular Surgery | Admitting: Vascular Surgery

## 2019-02-09 ENCOUNTER — Ambulatory Visit: Payer: Medicare Other | Admitting: Vascular Surgery

## 2019-02-09 ENCOUNTER — Encounter: Payer: Self-pay | Admitting: Vascular Surgery

## 2019-02-09 ENCOUNTER — Encounter (HOSPITAL_COMMUNITY): Payer: Medicare Other

## 2019-02-09 ENCOUNTER — Other Ambulatory Visit: Payer: Self-pay

## 2019-02-09 ENCOUNTER — Ambulatory Visit (INDEPENDENT_AMBULATORY_CARE_PROVIDER_SITE_OTHER)
Admission: RE | Admit: 2019-02-09 | Discharge: 2019-02-09 | Disposition: A | Discharge status: Home / Self Care | Payer: Medicare Other | Source: Ambulatory Visit | Attending: Vascular Surgery | Admitting: Vascular Surgery

## 2019-02-09 VITALS — BP 125/78 | HR 90 | Temp 97.3°F | Resp 24

## 2019-02-09 DIAGNOSIS — I779 Disorder of arteries and arterioles, unspecified: Secondary | ICD-10-CM

## 2019-02-09 NOTE — Progress Notes (Signed)
Vascular and Vein Specialist of [facility]  Patient name: John Ball MRN: [id number] DOB: [birth year] Sex: male  REASON FOR VISIT: Follow-up diffuse peripheral vascular occlusive disease  HPI: John Ball is a 78 y.o. male here today for follow-up.  He is here today with his wife.  He actually looks quite good.  He does report limitation with activity.  He has severe COPD and reports shortness of breath.  Also reports that his legs fatigue with walking.  He reports that this is equal right and left leg and his total leg.  He reports that this occurs about the same time as his shortness of breath stops him from walking.  He has a completely healed all lower extremity ulcerations.  Past Medical History:  Diagnosis Date  . Anxiety   . Arthritis   . Complication of anesthesia    hx prolonged post op oxygen dependent/  hx hallucinations for 2 day post op lobectomy 2012  . COPD, severe ([place])   . Depression   . Essential hypertension   . Hearing loss of both ears    does wear his hearing aids  . History of arterial bypass of lower extremity    RIGHT FEM-POP  . History of CHF (congestive heart failure)   . History of DVT of lower extremity   . History of panic attacks   . Hyperlipidemia with target LDL less than 70   . Left main coronary artery disease 2004   a) Severe LM CAD 2004 -->s/p  cabg x4 (LIMA-LAD, free RIMA-RI, SVG-OM 2, SVG-RPDA); b) 06/2014: Abnormal Myoview --> c)cardiac Cath Jan 2016: pLM 70%, pLAD 40-50%- patent LIMA-LAD.  RI -competitive flow noted from RIMA-RI graft, circumflex, normal caliber with moderate OM1.  OM 2 occluded.  SVG-OM 2 patent. RCA CTO after large RV M, faint R-R and LAD septal-PDA collaterals =--> new  . Neuropathy, peripheral   . 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 Nocturia   . OSA (obstructive sleep apnea)    uses O2  via [place]  --  intolerate to CPAP  . Peripheral arterial occlusive disease  ([place])    2011   a) Gore-Tex graft right AK popA-BK popA --> b) 2012: thrombosed graft --> thrombectomy with dacron patch = 1 V runnof via Peroneal; c) 01/2014: R femoropopliteal bypass with left femoral vein;; followed by dr Zackrey Dyar--  per last dopplers 06-28-2014 no change right graft,  50-74% stenosis common and left mid superficial femoral artery  . PTSD (post-traumatic stress disorder)    anxiety attack's---  [place] [facility]  . Recurrent productive cough    SMOKER'S COUGH  . 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  Medical management  . Smoker unmotivated to quit    Reportedly he came close to having a nervous breakdown when he last tried to quit  . 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 Wears glasses     Family History  Problem Relation Age of Onset  . Other Mother 38       Abdominal Aortic Aneurysm   . Stroke Mother   . Alzheimer's disease Father 62  . Stroke Father     SOCIAL HISTORY: Social History   Tobacco Use  . Smoking status: Current Every Day Smoker    Packs/day: 1.50    Years: 60.00    Pack years: 90.00    Types: Cigarettes    Start date: 09/14/1957  . Smokeless tobacco: Never Used  . Tobacco comment: 1/2 pk per day 01/14/19  Substance Use Topics  . Alcohol use: No    Allergies  Allergen Reactions  . Aspirin Other (See Comments)    Avoids due to severe bruises  . Crestor [Rosuvastatin Calcium] Other (See  Comments)    myalgia  . Lipitor [Atorvastatin Calcium] Other (See Comments)    myalgia  . Zetia [Ezetimibe] Other (See Comments)    myalgia  . Zocor [Simvastatin - High Dose] Other (See Comments)    myalgia  . Aripiprazole Other (See Comments)    Altered mental status  . Effexor [Venlafaxine Hydrochloride] Itching  . Morphine And Related Itching    Current Outpatient Medications  Medication Sig Dispense Refill  . acetaminophen (TYLENOL) 500 MG tablet Take 1,000 mg by mouth every 6 (six) hours as needed for moderate pain or headache.     . ADVAIR DISKUS 500-50 MCG/DOSE AEPB Inhale 1 puff into the lungs 2 (two) times daily.     Marland Kitchen albuterol (PROVENTIL HFA;VENTOLIN HFA) 108 (90 BASE) MCG/ACT inhaler Inhale 1 puff into the lungs every 6 (six) hours as needed for wheezing or shortness of breath.    Marland Kitchen albuterol (PROVENTIL) (2.5 MG/3ML) 0.083% nebulizer solution     . Carbomer Gel Base (HYDROGEL) GEL 1 g by Does not apply route daily. 100 g 0  . Cholecalciferol (VITAMIN D3) 1000 UNITS CAPS Take 1,000 Units by mouth every morning.     . DULoxetine (CYMBALTA) 30 MG capsule Take 60 mg by mouth daily. 60mg  (2 capsules) every morning    . lidocaine (LIDODERM) 5 % Place 3 patches onto the skin every 12 (twelve) hours. Remove & Discard patch within 12 hours or as directed by MD 90 patch 1  . OXYGEN Inhale 2 L into the lungs See admin instructions. Uses 2L at bedtime and if needed throughout the day -- intolerant to CPAP    . prazosin (MINIPRESS) 1 MG capsule Take 1 mg by mouth at bedtime.    . pregabalin (LYRICA) 75 MG capsule Take 1 capsule (75 mg total) by mouth 3 (three) times daily. 90 capsule 5  . QUEtiapine (SEROQUEL XR) 300 MG 24 hr tablet Take 300 mg by mouth at bedtime.     . ramipril (ALTACE) 2.5 MG capsule Take 2.5 mg by mouth every morning.     . tiotropium (SPIRIVA HANDIHALER) 18 MCG inhalation capsule Place 18 mcg into inhaler and inhale daily.    . traZODone (DESYREL) 150 MG tablet Take  0.5 tablets (75 mg total) by mouth at bedtime as needed for sleep. (Patient taking differently: Take 150 mg by mouth at bedtime. ) 10 tablet 0  . zolpidem (AMBIEN CR) 12.5 MG CR tablet Take 12.5 mg by mouth at bedtime.    . nitroGLYCERIN (NITROSTAT) 0.4 MG SL tablet Place 1 tablet (0.4 mg total) under the  tongue every 5 (five) minutes as needed for chest pain. (Patient not taking: Reported on 02/09/2019) 25 tablet 6   No current facility-administered medications for this visit.     REVIEW OF SYSTEMS:  [X]  denotes positive finding, [ ]  denotes negative finding Cardiac  Comments:  Chest pain or chest pressure:    Shortness of breath upon exertion: x   Short of breath when lying flat: x   Irregular heart rhythm:        Vascular    Pain in calf, thigh, or hip brought on by ambulation: x   Pain in feet at night that wakes you up from your sleep:     Blood clot in your veins:    Leg swelling:           PHYSICAL EXAM: Vitals:   02/09/19 1105  BP: 125/78  Pulse: 90  Resp: (!) 24  Temp: (!) 97.3 F (36.3 C)  TempSrc: Temporal  SpO2: 99%    GENERAL: The patient is a well-nourished male, in no acute distress. The vital signs are documented above. CARDIOVASCULAR: 2+ femoral pulses bilaterally.  Palpable popliteal pulse on the left.  I do not palpate a palpable popliteal on the right PULMONARY: There is good air exchange  MUSCULOSKELETAL: There are no major deformities or cyanosis. NEUROLOGIC: No focal weakness or paresthesias are detected. SKIN: There are no ulcers or rashes noted. PSYCHIATRIC: The patient has a normal affect.  DATA:  Noninvasive studies today reveal ankle arm index on the right of 0.78 and on the left of 0.94 with multiphasic waveforms bilaterally  MEDICAL ISSUES: Stable lower extremity vascular flow.  We will continue his walking program.  We will see him again in 1 year with repeat noninvasive studies    Rosetta Posner, MD [facility] and Ve[facility] Specialists of  [facility] Tel [phone] Pager [phone]

## 2019-02-17 ENCOUNTER — Other Ambulatory Visit: Payer: Self-pay

## 2019-02-17 MED ORDER — LIDOCAINE 5 % EX PTCH: 3.0000 | MEDICATED_PATCH | Freq: Two times a day (BID) | CUTANEOUS | 1 refills | Status: DC

## 2019-02-17 NOTE — Telephone Encounter (Signed)
Pharmacy sent refill request for Lidocaine patches.  Last filled 01/04/2019 for # 90 with 1 refill patient tp apply 3 patches to skin every 12 hours for shingles.  LOV 02/03/2019.  Please review and advise. MPulliam, CMA/RT(R)

## 2019-03-01 ENCOUNTER — Telehealth: Payer: Self-pay | Admitting: Family Medicine

## 2019-03-01 NOTE — Telephone Encounter (Signed)
Per [place], pt was informed of recommendations.  Charyl Bigger, CMA

## 2019-03-01 NOTE — Telephone Encounter (Signed)
Patient's wife called states she found an bump, hopes its not shingles again --She tried to send a pic thru Mychart butt was unsuccessful, so sending it via John Ball's email for Dr. Raliegh Scarlet to review & advise on what to do ( they still have some Rx left over from his last [place] outbreak.)  --Forwarding message to medical asst for review w/ provider & to call pt w/ advice.  --glh

## 2019-03-05 DIAGNOSIS — B029 Zoster without complications: Secondary | ICD-10-CM | POA: Insufficient documentation

## 2019-03-05 DIAGNOSIS — B0229 Other postherpetic nervous system involvement: Secondary | ICD-10-CM | POA: Insufficient documentation

## 2019-03-17 ENCOUNTER — Encounter: Payer: Self-pay | Admitting: Family Medicine

## 2019-03-17 ENCOUNTER — Other Ambulatory Visit: Payer: Self-pay

## 2019-03-17 ENCOUNTER — Ambulatory Visit (INDEPENDENT_AMBULATORY_CARE_PROVIDER_SITE_OTHER): Payer: Medicare Other | Admitting: Family Medicine

## 2019-03-17 ENCOUNTER — Encounter: Payer: Self-pay | Admitting: Gastroenterology

## 2019-03-17 VITALS — BP 112/80 | Temp 98.0°F | Ht 72.0 in | Wt 186.0 lb

## 2019-03-17 DIAGNOSIS — Z91199 Patient's noncompliance with other medical treatment and regimen due to unspecified reason: Secondary | ICD-10-CM

## 2019-03-17 DIAGNOSIS — Z1211 Encounter for screening for malignant neoplasm of colon: Secondary | ICD-10-CM | POA: Diagnosis not present

## 2019-03-17 DIAGNOSIS — E785 Hyperlipidemia, unspecified: Secondary | ICD-10-CM | POA: Diagnosis not present

## 2019-03-17 DIAGNOSIS — Z0000 Encounter for general adult medical examination without abnormal findings: Secondary | ICD-10-CM

## 2019-03-17 DIAGNOSIS — Z9119 Patient's noncompliance with other medical treatment and regimen: Secondary | ICD-10-CM | POA: Diagnosis not present

## 2019-03-17 DIAGNOSIS — I10 Essential (primary) hypertension: Secondary | ICD-10-CM | POA: Diagnosis not present

## 2019-03-17 DIAGNOSIS — I509 Heart failure, unspecified: Secondary | ICD-10-CM

## 2019-03-17 NOTE — Progress Notes (Signed)
Subjective:   John Ball is a 78 y.o. male who presents for Medicare Annual/Subsequent preventive examination.     Objective:    Vitals: BP 112/80   Temp 98 F (36.7 C)   Ht 6' (1.829 m)   Wt 186 lb (84.4 kg)   BMI 25.23 kg/m   Body mass index is 25.23 kg/m.  Advanced Directives 02/09/2019 02/20/2018 02/10/2018 12/03/2017 11/28/2017 11/28/2017 11/17/2017  Does Patient Have a Medical Advance Directive? Yes Yes Yes Yes Yes No Yes  Type of Paramedic of [place];Living will [place];Living will [place];Living will Healthcare Power of [place] of [place] of [place]  Does patient want to make changes to medical advance directive? No - Patient declined - No - Patient declined No - Patient declined No - Patient declined No - Patient declined No - Patient declined  Copy of [place] in Chart? No - copy requested - No - copy requested - No - copy requested No - copy requested No - copy requested  Would patient like information on creating a medical advance directive? - - - - - - -  Pre-existing out of facility DNR order (yellow form or pink MOST form) - - - - - - -    Tobacco Social History   Tobacco Use  Smoking Status Current Every Day Smoker  . Packs/day: 1.50  . Years: 60.00  . Pack years: 90.00  . Types: Cigarettes  . Start date: 09/14/1957  Smokeless Tobacco Never Used  Tobacco Comment   1/2 pk per day 01/14/19      Ready to quit: Not Answered Counseling given: Not Answered Comment: 1/2 pk per day 01/14/19     Past Medical History:  Diagnosis Date  . Anxiety   . Arthritis   . Complication of anesthesia    hx prolonged post op oxygen dependent/  hx hallucinations for 2 day post op lobectomy 2012  . COPD, severe ([place])   . Depression   . Essential hypertension   . Hearing loss of both ears    does wear his hearing aids  .  History of arterial bypass of lower extremity    RIGHT FEM-POP  . History of CHF (congestive heart failure)   . History of DVT of lower extremity   . History of panic attacks   . Hyperlipidemia with target LDL less than 70   . Left main coronary artery disease 2004   a) Severe LM CAD 2004 -->s/p  cabg x4 (LIMA-LAD, free RIMA-RI, SVG-OM 2, SVG-RPDA); b) 06/2014: Abnormal Myoview --> c)cardiac Cath Jan 2016: pLM 70%, pLAD 40-50%- patent LIMA-LAD.  RI -competitive flow noted from RIMA-RI graft, circumflex, normal caliber with moderate OM1.  OM 2 occluded.  SVG-OM 2 patent. RCA CTO after large RV M, faint R-R and LAD septal-PDA collaterals =--> new  . Neuropathy, peripheral   . 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 Nocturia   . OSA (obstructive sleep apnea)    uses O2  via [place]  --  intolerate to CPAP  . Peripheral arterial occlusive disease ([place])    2011   a) Gore-Tex graft right AK popA-BK popA --> b) 2012: thrombosed graft --> thrombectomy with dacron patch = 1 V runnof via Peroneal; c) 01/2014: R femoropopliteal bypass with left femoral vein;; followed by dr Early--  per last dopplers 06-28-2014 no change right graft,  50-74% stenosis common and left mid superficial femoral artery  . PTSD (post-traumatic stress disorder)    anxiety attack's---  [place]  . Recurrent productive cough    SMOKER'S COUGH  . 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  Medical management  . Smoker unmotivated to quit    Reportedly he came close to having a nervous breakdown when he last tried to quit  . 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 Wears glasses    Past Surgical History:  Procedure Laterality Date  . ABDOMINAL AORTOGRAM N/A 11/12/2017   Procedure: ABDOMINAL AORTOGRAM;  Surgeon: Waynetta Sandy, MD;  Location: [facility];  Service: Cardiovascular;  Laterality: N/A;  . ABDOMINAL AORTOGRAM W/LOWER EXTREMITY Left 02/10/2018   Procedure: ABDOMINAL AORTOGRAM W/LOWER EXTREMITY;  Surgeon: Serafina Mitchell, MD;  Location: [facility];  Service: Cardiovascular;  Laterality: Left;  . APPLICATION OF WOUND VAC Left 12/03/2017   Procedure: EXCHANGE OF WOUND VAC LEFT GROIN,;  Surgeon: Rosetta Posner, MD;  Location: [facility];  Service: Vascular;  Laterality: Left;  . CARDIAC CATHETERIZATION  07-13-2002   Ischemia RCA region on Cardiolite// 60-70% ostial left main, 50% midCx, 60-70% mid RI,  70% mRCA, 90% JeNu and crux 75% RCA ,  95% PLA//  severe LM and 3Vessel CAD, perserved LV, ef 60%  . CARDIOVASCULAR STRESS TEST  last one  06-17-2014   dr  Ellyn Hack   Carlton Adam study with no exercise; Intermediate Risk Study;   moderate size and intensity, partially reversible inferior septal defect consistent with prior infarct and mild to moderate peri-infarct ischemia;  mild hypokinesis of the inferior septal wall,  normal LV function, ef 56%  . COLONOSCOPY  2008    WNL  . CORONARY ARTERY BYPASS GRAFT  07/27/2002   LIMA-LAD, freeRIMA-RI,SVG-OM2, SVG-rPDA; Dr. Roxan Hockey  . CYSTOSCOPY W/ URETERAL STENT PLACEMENT N/A 02/21/2015   Procedure: BILATERAL RETROGRADE PYELOGRAM;  Surgeon: Festus Aloe, MD;  [facility];  Service: Urology;  Laterality: N/A;  . ENDARTERECTOMY FEMORAL Left 11/17/2017   Procedure: LEFT Femoral Endarterectomy with Patch Angioplasty;  Surgeon: Rosetta Posner, MD;  Location: [facility];  Service:  Vascular;  Laterality: Left;  . EYE SURGERY Left Nov 2013  . FEMORAL ARTERY - POPLITEAL ARTERY BYPASS GRAFT Right 10-24-2008   dr early   w/ GoreTex graft  . FEMORAL-POPLITEAL BYPASS GRAFT Left 02/11/2018   Procedure: BYPASS GRAFT LOWER EXTREMITY;  Surgeon: Rosetta Posner, MD;  Location: [facility];  Service: Vascular;  Laterality: Left;  . FEMORAL-TIBIAL BYPASS GRAFT Right 01/10/2014   Procedure: Right Femoral to Posterior Tibial Bypass Graft using Left Leg Vein, Thrombectomy Right Common Femoral and Profunda of Leg . ;  Surgeon: Rosetta Posner, MD;  Location: [facility];  Service: Vascular;  Laterality: Right;  . HYDROCELE EXCISION Left   .  LEFT ANKLE SURGERY  1990's  . LEFT HEART CATHETERIZATION WITH CORONARY/GRAFT ANGIOGRAM N/A 07/12/2014   Procedure: LEFT HEART CATHETERIZATION WITH Beatrix Fetters;  Surgeon: Troy Sine, MD;  Location: [facility];  Service: Cardiovascular;  Laterality: N/A;  pLM 70%, pLAD 40-50%- patent LIMA-LAD.  RI -competitive flow noted from fRIMA-RI graft, circumflex, normal caliber with moderate OM1.  OM 2 occluded.  SVG-OM 2 patent. RCA CTO after large RVM, faint R-R and LAD septal-PDA collateral  . LOWER EXTREMITY ANGIOGRAPHY Bilateral 11/12/2017   Procedure: LOWER EXTREMITY ANGIOGRAPHY - Left Lower;  Surgeon: Waynetta Sandy, MD;  Location: [facility];  Service: Cardiovascular;  Laterality: Bilateral;  . NM MYOVIEW LTD  11/'15; 3/'19, 5/'20   a) Mod sized, mod intensity/partially reversible inferior defect c/w  prior infarct mild/mod peri-infarct ischemia.  Inferoseptal HK.  INTERMEDIATE RISK. --> Cath showed CTO of Native RCA & SVG-rPDA.; b) EF ~42%. Inferior-Inferoseptal defect - likely old scar - INTERMEDIATE RISK b/c low EF (Echo EF 50-55%) - med Rx; c) [place] -by report - Non-ischemic.  Marland Kitchen PERIPHERAL VASCULAR INTERVENTION Left 02/10/2018   Procedure: PERIPHERAL VASCULAR INTERVENTION;  Surgeon: Serafina Mitchell, MD;  Location: [facility];   Service: Cardiovascular;  Laterality: Left;  left common iliac stent, attempted left SFA stent  . RIGHT ABOVE KNEE POPLITEAL GRAFT TO BELOW KNEE POPLITEAL ARTERY BYPASS WITH REVERSE SAPHENOUS VEIN  03-02-2010  . THROMBECTOMY OF RIGHT FEMORAL TO ABOVE KNEE POPLITEAL GORETEX GRAFT ANGIOPLASTY OF GORETEX AND SAPHENOUS VEIN JUNCTION AND ABOVE KNEE POPLITEAL ARTERY   10-06-2010   DR EARLY  . TONSILLECTOMY    . TRANSTHORACIC ECHOCARDIOGRAM  09/2017   EF 50-55%.  More accurate - Mild inferolateral HK (c/w known RCA occlusion).  GR 1 DD.  Mild LA dilation mildly increased PA pressures with mild RV/RA dilation.  . TRANSURETHRAL RESECTION OF BLADDER TUMOR N/A 02/21/2015   Procedure: TRANSURETHRAL RESECTION OF BLADDER TUMOR (TURBT);  Surgeon: Festus Aloe, MD;  Location: [facility];  Service: Urology;  Laterality: N/A;  . VIDEO ASSISTED THORACOSCOPY (VATS)/WEDGE RESECTION Right 05-01-2011    [place]   LOWER LOBECTOMY W/  NODE DISSECTION  . WOUND EXPLORATION Left 11/28/2017   Procedure: WOUND EXPLORATION GROIN, WASHOUT AND WOUND VAC APPLICATION;  Surgeon: Conrad Shallowater, MD;  Location: [facility];  Service: Vascular;  Laterality: Left;   Family History  Problem Relation Age of Onset  . Other Mother 32       Abdominal Aortic Aneurysm   . Stroke Mother   . Alzheimer's disease Father 8  . Stroke Father    Social History   Socioeconomic History  . Marital status: Married    Spouse name: Not on file  . Number of children: 2  . Years of education: Not on file  . Highest education level: Not on file  Occupational History  . Not on file  Social Needs  . Financial resource strain: Not on file  . Food insecurity    Worry: Not on file    Inability: Not on file  . Transportation needs    Medical: Not on file    Non-medical: Not on file  Tobacco Use  . Smoking status: Current Every Day Smoker    Packs/day: 1.50    Years: 60.00    Pack years: 90.00    Types: Cigarettes    Start date:  09/14/1957  . Smokeless tobacco: Never Used  . Tobacco comment: 1/2 pk per day 01/14/19  Substance and Sexual Activity  . Alcohol use: No  . Drug use: No  . Sexual activity: Not on file  Lifestyle  . Physical activity    Days per week: Not on file    Minutes per session: Not on file  . Stress: Not on file  Relationships  . Social Herbalist on phone: Not on file    Gets together: Not on file    Attends religious service: Not on file    Active member of club or organization: Not on file    Attends meetings of clubs or organizations: Not on file    Relationship status: Not on file  Other Topics Concern  . Not on file  Social History Narrative   He is married with 2 children. Does not exercise regularly.   He and his wife have been recently living in [place], [place] (but just moved back to [place], [place] in November 2018).   He still smokes about a half-pack a day and is not interested in quitting. No alcohol or recreational drug use.      I confirmed with him and his wife that he does have a DNR order signed and displayed.       Outpatient Encounter Medications as of 03/17/2019  Medication Sig  . acetaminophen (TYLENOL) 500 MG tablet Take 1,000 mg by mouth every 6 (six) hours as needed for moderate pain or headache.   . ADVAIR DISKUS 500-50 MCG/DOSE AEPB Inhale 1 puff into the lungs 2 (two) times daily.   Marland Kitchen albuterol (PROVENTIL HFA;VENTOLIN HFA) 108 (90 BASE) MCG/ACT inhaler Inhale 1 puff into the lungs every 6 (six) hours as needed for wheezing or shortness of breath.  Marland Kitchen albuterol (PROVENTIL) (2.5 MG/3ML) 0.083% nebulizer solution   . Carbomer Gel Base (HYDROGEL) GEL 1 g by Does not apply route daily.  . Cholecalciferol (VITAMIN D3) 1000 UNITS CAPS Take 1,000 Units by mouth every morning.   . DULoxetine (CYMBALTA) 30 MG capsule Take 60 mg by mouth daily. 60mg  (2 capsules) every morning  . lidocaine (LIDODERM) 5 % Place 3 patches onto the skin every 12 (twelve) hours.  Remove & Discard patch within 12 hours or as directed by MD  . nitroGLYCERIN (NITROSTAT) 0.4 MG SL tablet Place 1 tablet (0.4 mg total) under the tongue every 5 (five) minutes as needed for chest pain.  . OXYGEN Inhale 2 L into the lungs See admin instructions. Uses 2L at bedtime and if needed throughout the day -- intolerant to CPAP  . prazosin (MINIPRESS) 1 MG capsule Take 1 mg by mouth at bedtime.  . pregabalin (LYRICA) 75 MG capsule Take 1 capsule (75 mg total) by mouth 3 (three) times daily.  . QUEtiapine (SEROQUEL XR) 300 MG 24 hr tablet Take 300 mg by mouth at bedtime.   . ramipril (ALTACE) 2.5 MG capsule Take 2.5 mg by mouth every morning.   . tiotropium (SPIRIVA HANDIHALER) 18 MCG inhalation capsule Place 18 mcg into inhaler and inhale daily.  . traZODone (DESYREL) 150 MG tablet Take 0.5 tablets (75 mg total) by mouth at bedtime as needed for sleep. (Patient taking differently: Take 150 mg by mouth at bedtime. )  . zolpidem (AMBIEN CR) 12.5 MG CR tablet Take 12.5 mg by mouth at bedtime.   No facility-administered encounter medications on file as of 03/17/2019.     Activities of Daily Living In your present state of health, do you have any difficulty performing the following activities: 03/17/2019  Hearing? Y  Vision? N  Difficulty concentrating or making decisions? N  Walking or climbing stairs? Y  Dressing or bathing? N  Doing errands, shopping? N  Some recent data might be hidden    Patient Care Team: Mellody Dance, DO as PCP - General (Family Medicine)   Assessment:   This is a routine wellness examination for [place].  Exercise Activities and Dietary recommendations    Goals   None     Fall Risk Fall Risk  03/17/2019 12/07/2018 09/25/2017 05/07/2017 05/09/2014  Falls in the past year? 0 0 No No No  Number falls in past yr: 0 - - - -   Is the patient's home free of loose throw rugs in walkways, pet beds, electrical cords, etc?   yes      Grab bars in the bathroom? yes       Handrails on the stairs?   no      Adequate lighting?   yes  Timed Get Up and Go Performed: n/a  Depression Screen PHQ 2/9 Scores 03/17/2019 02/03/2019 12/07/2018 09/25/2017  PHQ - 2 Score 0 4 - 6  PHQ- 9 Score 0 23 - 26  Exception Documentation - - Patient refusal -      6CIT Screen 03/17/2019  What Year? 0 points  What month? 0 points  What time? 0 points  Count back from 20 0 points  Months in reverse 0 points  Repeat phrase 0 points  Total Score 0    Immunization History  Administered Date(s) Administered  . Influenza Split 03/14/2017, 03/17/2018  . Influenza, High Dose Seasonal PF 04/03/2018  . Influenza,inj,Quad PF,6+ Mos 03/05/2016  . Meningococcal Polysaccharide 03/12/2017  . Pneumococcal Conjugate-13 04/29/2014  . Pneumococcal Polysaccharide-23 03/12/2017  . Td 07/10/2018  . Tdap 10/30/2006    Qualifies for Shingles Vaccine?  Yes patient declines through [place] that he will get through the [facility]  Topic Date Due  . INFLUENZA VACCINE  02/06/2019  . TETANUS/TDAP  07/10/2028  . PNA vac Low Risk Adult  Completed    Cancer Screenings: Lung: Low Dose CT Chest recommended if Age 52-80 years, 30 pack-year currently smoking OR have quit w/in 15years. Patient does not qualify---> patient with a history of lung cancer last CT chest was done through Dr. Halford Chessman of pulmonology back in February 2020.  This is being followed by pulmonology  Colorectal:  2008- apparently WNL's, but do not have those records for my review  Additional Screenings: Hepatitis C Screening: done thru [place]      Plan:     --> Patient's medical care is somewhat scattered as they prefer to do everything through the [place] that they can.  However, patient has not gotten me those records and/or possibly he has not gone for the screening tests.  Everything I asked him today in terms of colonoscopy, shingles vaccine etc., wife and patient both state he will get these through the [place]  hospital in [place] to place future orders for complete set of FBW.  Wife and patient are both aware is been over a year and a half since this was last done with [place], and this needs to be done in the very near future.  Wife said she would call for appointment  -For colonoscopy.  Per our records last done in 2008.  Family completely declined this and said they would get it through the [place] however, I have asked CMA to just place future  orders so that we can make sure this is completed  -Patient states he has no memory issues in the 6 it was completely normal per CMA.  Also patient has hearing aids which he does not use hence, does not hear so well when not using them.  I have personally reviewed and noted the following in the patient's chart:   . Medical and social history . Use of alcohol, tobacco or illicit drugs  . Current medications and supplements . Functional ability and status . Nutritional status . Physical activity . Advanced directives . List of other physicians . Hospitalizations, surgeries, and ER visits in previous 12 months . Vitals . Screenings to include cognitive, depression, and falls . Referrals and appointments  In addition, I have reviewed and discussed with patient certain preventive protocols, quality metrics, and best practice recommendations. A written personalized care plan for preventive services as well as general preventive health recommendations were provided to patient.     Mellody Dance, DO  03/17/2019

## 2019-03-24 ENCOUNTER — Other Ambulatory Visit: Payer: Self-pay

## 2019-03-24 ENCOUNTER — Other Ambulatory Visit: Payer: Medicare Other

## 2019-03-24 DIAGNOSIS — R739 Hyperglycemia, unspecified: Secondary | ICD-10-CM | POA: Diagnosis not present

## 2019-03-24 DIAGNOSIS — I10 Essential (primary) hypertension: Secondary | ICD-10-CM

## 2019-03-24 DIAGNOSIS — E785 Hyperlipidemia, unspecified: Secondary | ICD-10-CM

## 2019-03-24 DIAGNOSIS — I509 Heart failure, unspecified: Secondary | ICD-10-CM | POA: Diagnosis not present

## 2019-03-24 DIAGNOSIS — Z9119 Patient's noncompliance with other medical treatment and regimen: Secondary | ICD-10-CM | POA: Diagnosis not present

## 2019-03-24 DIAGNOSIS — Z91199 Patient's noncompliance with other medical treatment and regimen due to unspecified reason: Secondary | ICD-10-CM

## 2019-03-24 DIAGNOSIS — Z0000 Encounter for general adult medical examination without abnormal findings: Secondary | ICD-10-CM

## 2019-03-24 NOTE — Progress Notes (Signed)
Pt came to the office today for labs.  Vitamin D not covered by pt's insurance.  Therefore, order cancelled.  Charyl Bigger, CMA

## 2019-03-25 LAB — LIPID PANEL WITH LDL/HDL RATIO
Cholesterol, Total: 166 mg/dL (ref 100–199)
HDL: 45 mg/dL (ref >39)
LDL Chol Calc (NIH): 105 mg/dL — ABNORMAL HIGH (ref 0–99)
LDL/HDL Ratio: 2.3 ratio (ref 0.0–3.6)
Triglycerides: 85 mg/dL (ref 0–149)
VLDL Cholesterol Cal: 16 mg/dL (ref 5–40)

## 2019-03-25 LAB — COMPREHENSIVE METABOLIC PANEL
ALT: 18 IU/L (ref 0–44)
AST: 18 IU/L (ref 0–40)
Albumin/Globulin Ratio: 2.4 — ABNORMAL HIGH (ref 1.2–2.2)
Albumin: 4.8 g/dL — ABNORMAL HIGH (ref 3.7–4.7)
Alkaline Phosphatase: 64 IU/L (ref 39–117)
BUN/Creatinine Ratio: 24 (ref 10–24)
BUN: 22 mg/dL (ref 8–27)
Bilirubin Total: 0.3 mg/dL (ref 0.0–1.2)
CO2: 22 mmol/L (ref 20–29)
Calcium: 9.6 mg/dL (ref 8.6–10.2)
Chloride: 101 mmol/L (ref 96–106)
Creatinine, Ser: 0.93 mg/dL (ref 0.76–1.27)
GFR calc Af Amer: 92 mL/min/{1.73_m2} (ref >59)
GFR calc non Af Amer: 79 mL/min/{1.73_m2} (ref >59)
Globulin, Total: 2 g/dL (ref 1.5–4.5)
Glucose: 89 mg/dL (ref 65–99)
Potassium: 4.5 mmol/L (ref 3.5–5.2)
Sodium: 144 mmol/L (ref 134–144)
Total Protein: 6.8 g/dL (ref 6.0–8.5)

## 2019-03-25 LAB — CBC WITH DIFFERENTIAL/PLATELET
Basophils Absolute: 0 10*3/uL (ref 0.0–0.2)
Basos: 0 %
EOS (ABSOLUTE): 0.2 10*3/uL (ref 0.0–0.4)
Eos: 5 %
Hematocrit: 38.5 % (ref 37.5–51.0)
Hemoglobin: 13.2 g/dL (ref 13.0–17.7)
Immature Grans (Abs): 0 10*3/uL (ref 0.0–0.1)
Immature Granulocytes: 0 %
Lymphocytes Absolute: 1.2 10*3/uL (ref 0.7–3.1)
Lymphs: 30 %
MCH: 34.8 pg — ABNORMAL HIGH (ref 26.6–33.0)
MCHC: 34.3 g/dL (ref 31.5–35.7)
MCV: 102 fL — ABNORMAL HIGH (ref 79–97)
Monocytes Absolute: 0.3 10*3/uL (ref 0.1–0.9)
Monocytes: 7 %
Neutrophils Absolute: 2.4 10*3/uL (ref 1.4–7.0)
Neutrophils: 58 %
Platelets: 129 10*3/uL — ABNORMAL LOW (ref 150–450)
RBC: 3.79 x10E6/uL — ABNORMAL LOW (ref 4.14–5.80)
RDW: 13.6 % (ref 11.6–15.4)
WBC: 4.1 10*3/uL (ref 3.4–10.8)

## 2019-03-25 LAB — T4, FREE: Free T4: 0.97 ng/dL (ref 0.82–1.77)

## 2019-03-25 LAB — T3: T3, Total: 91 ng/dL (ref 71–180)

## 2019-03-25 LAB — HEMOGLOBIN A1C
Est. average glucose Bld gHb Est-mCnc: 117 mg/dL
Hgb A1c MFr Bld: 5.7 % — ABNORMAL HIGH (ref 4.8–5.6)

## 2019-03-25 LAB — TSH: TSH: 3.26 u[IU]/mL (ref 0.450–4.500)

## 2019-04-12 ENCOUNTER — Telehealth: Payer: Self-pay | Admitting: Pulmonary Disease

## 2019-04-12 NOTE — Telephone Encounter (Signed)
Spoke with pt's wife, Wells Guiles. States that [facility] has been hassling them about paying a $3500 bill. They told her that they need certain documentation from [place] to be able to bill insurance for this money. If we don't give [employer] this information, the pt will be responsible for the bill. A community message will be sent to [facility] to check in to what exactly is needed.

## 2019-04-13 NOTE — Telephone Encounter (Signed)
Stenson, Elisha Ponder, CMA; Stenson, Melissa        Good morning!  I found out that this patient just needs his 5 year Medicare requalification done for his oxygen.   He'll need everything just like for a new O2 referral. Office visit notes, sats and new order.   Let me know if you have any other questions and thanks for reaching out!   Melissa   --------------------------------------------------------- LMTCB x1 for pt's wife.

## 2019-04-13 NOTE — Telephone Encounter (Signed)
Spoke with pt's wife. Pt has been scheduled to come in tomorrow at 1530 with Judson Roch. Nothing further is needed.

## 2019-04-13 NOTE — Telephone Encounter (Signed)
Pt returning your call

## 2019-04-14 ENCOUNTER — Ambulatory Visit (INDEPENDENT_AMBULATORY_CARE_PROVIDER_SITE_OTHER): Payer: Medicare Other | Admitting: Acute Care

## 2019-04-14 ENCOUNTER — Other Ambulatory Visit: Payer: Self-pay

## 2019-04-14 ENCOUNTER — Ambulatory Visit (INDEPENDENT_AMBULATORY_CARE_PROVIDER_SITE_OTHER): Payer: Medicare Other

## 2019-04-14 ENCOUNTER — Encounter: Payer: Self-pay | Admitting: Acute Care

## 2019-04-14 VITALS — BP 122/62 | HR 88 | Temp 97.9°F | Ht 72.0 in | Wt 183.4 lb

## 2019-04-14 DIAGNOSIS — I779 Disorder of arteries and arterioles, unspecified: Secondary | ICD-10-CM | POA: Diagnosis not present

## 2019-04-14 DIAGNOSIS — F17210 Nicotine dependence, cigarettes, uncomplicated: Secondary | ICD-10-CM | POA: Diagnosis not present

## 2019-04-14 DIAGNOSIS — Z23 Encounter for immunization: Secondary | ICD-10-CM | POA: Diagnosis not present

## 2019-04-14 DIAGNOSIS — R0600 Dyspnea, unspecified: Secondary | ICD-10-CM

## 2019-04-14 DIAGNOSIS — Z1211 Encounter for screening for malignant neoplasm of colon: Secondary | ICD-10-CM

## 2019-04-14 DIAGNOSIS — J432 Centrilobular emphysema: Secondary | ICD-10-CM

## 2019-04-14 DIAGNOSIS — R0602 Shortness of breath: Secondary | ICD-10-CM | POA: Diagnosis not present

## 2019-04-14 DIAGNOSIS — R0609 Other forms of dyspnea: Secondary | ICD-10-CM

## 2019-04-14 LAB — D-DIMER, QUANTITATIVE: D-Dimer, Quant: 3.28 mcg/mL FEU — ABNORMAL HIGH (ref <0.50)

## 2019-04-14 NOTE — Progress Notes (Signed)
History of Present Illness John Ball is a  78 y.o. male smoker with COPD with emphysema and chronic bronchitis, and chronic respiratory failure with hypoxia. He has hx of NSCLC s/p Rt lower lobectomy.He requires home oxygen at 3 L John Ball. He is followed by Dr. Halford Ball.   04/14/2019 OV for oxygen qualification Pt. Presents for oxygen qualification. Per his wife he is due for his  5 year Medicare requalification done for his oxygen. We were told he would need everything, just like initial referral for new oxygen, office notes, sats and new order. Upon arrival patient is in a battery operated wheelchair. His oxygen is currently off as he is getting qualified for his oxygen. He is breathing about 25 times a minute off his oxygen and he has accessory muscle use. His wife says this is common for him as he often ambulates without his oxygen while I the house walking short distances because it is difficult to carry his oxygen tank. . We discussed the need for him to use his oxygen 24/7. He continues to smoke about 1 PPD.We discussed that he does need to quit.  I have reminded the patient not to smoke while wearing his oxygen. He is compliant with his Advair, Spiriva and his Ventolin inhaler. He gets these medications through the [place]. Per his wife he seems more short of breath recently. We will check a CXR , CBC, BMET and d dimer today. He denies fever, chest pain, orthopnea or hemoptysis. He has had about a 5 pound weight loss in the last 5 months.     Test Results:  Pulmonary tests:  Spirometry 03/05/16 >> FEV1 1.21 (36%), FEV1% 54% ABG 03/07/16 >> pH 7.40, PaCO2 42.6, PaO2 59.3  Chest imaging:  CT chest 04/20/15 >> atherosclerosis, mild centrilobular/paraseptal emphysema, s/p RLL ectomy CT angio chest 09/12/17 >> thickened Rt diaphragm, s/p RLLectomy, tree in bud CT chest 08/19/18 >> centrilobular/paraseptal emphysema, volume loss Rt hemithorax, decreased LAN CXR 04/14/2019>> No acute changes Cardiac  tests:  Echo 09/26/17 >> EF 50 to 55%, grade 1 DD, PAS 36 mmHg    CBC Latest Ref Rng & Units 03/24/2019 02/12/2018 02/10/2018  WBC 3.4 - 10.8 x10E3/uL 4.1 5.8 -  Hemoglobin 13.0 - 17.7 g/dL 13.2 11.0(L) 13.6  Hematocrit 37.5 - 51.0 % 38.5 35.3(L) 40.0  Platelets 150 - 450 x10E3/uL 129(L) 129(L) -    BMP Latest Ref Rng & Units 03/24/2019 08/17/2018 02/10/2018  Glucose 65 - 99 mg/dL 89 97 88  BUN 8 - 27 mg/dL 22 22 20   Creatinine 0.76 - 1.27 mg/dL 0.93 1.00 1.00  BUN/Creat Ratio 10 - 24 24 - -  Sodium 134 - 144 mmol/L 144 140 139  Potassium 3.5 - 5.2 mmol/L 4.5 4.4 4.3  Chloride 96 - 106 mmol/L 101 102 103  CO2 20 - 29 mmol/L 22 30 -  Calcium 8.6 - 10.2 mg/dL 9.6 9.7 -    BNP    Component Value Date/Time   BNP 73.2 09/12/2017 1500    ProBNP No results found for: PROBNP  PFT    Component Value Date/Time   FEV1PRE 1.83 02/25/2014 1610   FVCPRE 3.08 02/25/2014 1610   DLCOUNC 12.29 02/25/2014 1610   PREFEV1FVCRT 59 02/25/2014 1610    No results found.   Past medical hx Past Medical History:  Diagnosis Date   Anxiety    Arthritis    Complication of anesthesia    hx prolonged post op oxygen dependent/  hx hallucinations for  2 day post op lobectomy 2012   COPD, severe (John Ball)    Depression    Essential hypertension    Hearing loss of both ears    does wear his hearing aids   History of arterial bypass of lower extremity    RIGHT FEM-POP   History of CHF (congestive heart failure)    History of DVT of lower extremity    History of panic attacks    Hyperlipidemia with target LDL less than 70    Left main coronary artery disease 2004   a) Severe LM CAD 2004 -->s/p  cabg x4 (LIMA-LAD, free RIMA-RI, SVG-OM 2, SVG-RPDA); b) 06/2014: Abnormal Myoview --> c)cardiac Cath Jan 2016: pLM 70%, pLAD 40-50%- patent LIMA-LAD.  RI -competitive flow noted from RIMA-RI graft, circumflex, normal caliber with moderate OM1.  OM 2 occluded.  SVG-OM 2 patent. RCA CTO after large RV  M, faint R-R and LAD septal-PDA collaterals =--> new   Neuropathy, peripheral    No natural teeth    does not wear his dentures   Nocturia    OSA (obstructive sleep apnea)    uses O2  via [place]  --  intolerate to CPAP   Peripheral arterial occlusive disease (John Ball)    2011   a) Gore-Tex graft right AK popA-BK popA --> b) 2012: thrombosed graft --> thrombectomy with dacron patch = 1 V runnof via Peroneal; c) 01/2014: R femoropopliteal bypass with left femoral vein;; followed by dr Early--  per last dopplers 06-28-2014 no change right graft,  50-74% stenosis common and left mid superficial femoral artery   PTSD (post-traumatic stress disorder)    anxiety attack's---  [place] Vet   Recurrent productive cough    SMOKER'S COUGH   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--- a) 04/2011: R L Lobectomy & med node dissection ([id number]) - [facility] [place], [place]) w/o post-op Rx; b) CT-A Chest Jan 2013: R pl effusion, R hilar LAN (3.3 cm x 2.8 cm & 2.2 cm x 1.3 cm); c) 2/'13 PET-CT Chest: Bilat Hilar LAN, no distant Mets  d) CT chest 9/'14: Stable shotty hilar nodes bilaterally w/ no pathologic sized LAD or suspicious Pulm nodule; ;;  (oncologist at   Wears glasses      Social History   Tobacco Use   Smoking status: Current Every Day Smoker    Packs/day: 1.50    Years: 60.00    Pack years: 90.00    Types: Cigarettes    Start date: 09/14/1957   Smokeless tobacco: Never Used   Tobacco comment: 1/2 pk per day 01/14/19  Substance Use Topics   Alcohol use: No   Drug use: No    Mr.Man reports that he has been smoking cigarettes. He  started smoking about 61 years ago. He has a 90.00 pack-year smoking history. He has never used smokeless tobacco. He reports that he does not drink alcohol or use drugs.  Tobacco Cessation: Current every day smoker with a 90 pack year smoking history  Past surgical hx, Family hx, Social hx all reviewed.  Current Outpatient Medications on File  Prior to Visit  Medication Sig   acetaminophen (TYLENOL) 500 MG tablet Take 1,000 mg by mouth every 6 (six) hours as needed for moderate pain or headache.    ADVAIR DISKUS 500-50 MCG/DOSE AEPB Inhale 1 puff into the lungs 2 (two) times daily.    albuterol (PROVENTIL HFA;VENTOLIN HFA) 108 (90 BASE) MCG/ACT inhaler Inhale 1 puff into the lungs every 6 (six) hours as needed for wheezing or shortness of breath.   albuterol (PROVENTIL) (2.5 MG/3ML) 0.083% nebulizer solution    Carbomer Gel Base (HYDROGEL) GEL 1 g by Does not apply route daily.   Cholecalciferol (VITAMIN D3) 1000 UNITS CAPS Take 1,000 Units by mouth every morning.    DULoxetine (CYMBALTA) 30 MG capsule Take 60 mg by mouth daily. 60mg  (2 capsules) every morning   lidocaine (LIDODERM) 5 % Place 3 patches onto the skin every 12 (twelve) hours. Remove & Discard patch within 12 hours or as directed by MD   nitroGLYCERIN (NITROSTAT) 0.4 MG SL tablet Place 1 tablet (0.4 mg total) under the tongue every 5 (five) minutes as needed for chest pain.   OXYGEN Inhale 2 L into the lungs See admin instructions. Uses 2L at bedtime and if needed throughout the day -- intolerant to CPAP   prazosin (MINIPRESS) 1 MG capsule Take 1 mg by mouth at bedtime.   pregabalin (LYRICA) 75 MG capsule Take 1 capsule (75 mg total) by mouth 3 (three) times daily.   QUEtiapine (SEROQUEL XR) 300 MG 24 hr tablet Take 300 mg by mouth at bedtime.    ramipril (ALTACE) 2.5 MG capsule Take 2.5 mg by mouth every morning.    tiotropium (SPIRIVA HANDIHALER) 18 MCG inhalation capsule Place 18 mcg into inhaler and inhale  daily.   traZODone (DESYREL) 150 MG tablet Take 0.5 tablets (75 mg total) by mouth at bedtime as needed for sleep. (Patient taking differently: Take 150 mg by mouth at bedtime. )   zolpidem (AMBIEN CR) 12.5 MG CR tablet Take 12.5 mg by mouth at bedtime.   No current facility-administered medications on file prior to visit.      Allergies  Allergen Reactions   Aspirin Other (See Comments)    Avoids due to severe bruises   Crestor [Rosuvastatin Calcium] Other (See Comments)    myalgia   Lipitor [Atorvastatin Calcium] Other (See Comments)    myalgia   Zetia [Ezetimibe] Other (See Comments)    myalgia   Zocor [Simvastatin - High Dose] Other (See Comments)    myalgia   Aripiprazole Other (See Comments)    Altered mental status   Effexor [Venlafaxine Hydrochloride] Itching   Morphine And Related Itching    Review Of Systems:  Constitutional:   No  weight loss, night sweats,  Fevers, chills,+  fatigue, or  lassitude.  HEENT:   No headaches,  Difficulty swallowing,  Tooth/dental problems, or  Sore throat,                No sneezing, itching, ear ache, nasal congestion, post nasal drip,   CV:  No chest pain,  Orthopnea, PND, swelling in lower extremities, anasarca, dizziness, palpitations, syncope.   GI  No heartburn, indigestion, abdominal pain, nausea, vomiting, diarrhea, change in bowel habits, loss of appetite, bloody stools.   Resp: + shortness of breath with exertion and  at rest.  + excess mucus, no productive cough,  No non-productive cough,  No coughing up of blood.  No change in color of mucus. + wheezing.  No  chest wall deformity  Skin: no rash or lesions.  GU: no dysuria, change in color of urine, no urgency or frequency.  No flank pain, no hematuria   MS:  No joint pain or swelling.  No decreased range of motion.  No back pain.  Psych:  No change in mood or affect. No depression or anxiety.  No memory loss.   Vital Signs BP 122/62 (BP Location: Left Arm,  Cuff Size: Large)    Pulse 88    Temp 97.9 F (36.6 C) (Oral)    Ht 6' (1.829 m)    Wt 183 lb 6.4 oz (83.2 kg)    SpO2 99%    BMI 24.87 kg/m    Physical Exam:  General- No distress,  A&Ox3, pleasant ENT: No sinus tenderness, TM clear, pale nasal mucosa, no oral exudate,no post nasal drip, no LAN Cardiac: S1, S2, regular rate and rhythm, no murmur Chest: No wheeze/ rales/ dullness; + accessory muscle use, + nasal flaring with exertion, , no sternal retractions Abd.: Soft Non-tender, ND BS + Ext: No clubbing cyanosis, edema Neuro:  MAE x 4, A&O x 3, deconditioned at baseline Skin: No rashes, No lesions, warm and dry Psych: normal mood and behavior   Assessment/Plan  COPD with emphysema and chronic bronchitis.  Plan - continue spiriva, advair, prn albuterol - Will check CXR today, we will call results - Will check d dimer, CBC, BMET   Chronic respiratory failure with hypoxia.             He has PaO2 < 60 and chronic leg edema in setting of COPD Walked in the office today, 04/14/2019>> dropped to 88%, RR was  Plan - Please place order for oxygen  - Saturation goals are 88-92% - Wear 3 liters oxygen 24/7  Tobacco abuse. - Continue to work on smoking cessation - Do not smoke while wearing oxygen - Will consider CT Chest   Health Maintenance - High Dose Flu vaccine today  Patient Instructions  Follow up with Dr. Halford Ball or Judson Roch NP  in 6 months    Magdalen Spatz, NP 04/14/2019  3:28 PM

## 2019-04-14 NOTE — Patient Instructions (Addendum)
It is good to see you today Thank you for your service in the [place]  COPD with emphysema and chronic bronchitis.  Plan - continue spiriva, advair, prn albuterol - Will check CXR today, we will call results - Will check d dimer, CBC, BMET   Chronic respiratory failure with hypoxia.             He has PaO2 < 60 and chronic leg edema in setting of COPD Walked in the office today, 04/14/2019>> dropped to 88%, RR was 30, + accessory Muscle use, + nasal Flaring Plan - Please place order for oxygen  - Saturation goals are 88-92% - Wear 3 liters oxygen 24/7  Tobacco abuse. - Continue to work on smoking cessation - Do not smoke while wearing oxygen - Will consider [place] today  Follow up in 3 months with Judson Roch NP or Dr. Halford Chessman Please contact office for sooner follow up if symptoms do not improve or worsen or seek emergency care

## 2019-04-15 ENCOUNTER — Telehealth: Payer: Self-pay | Admitting: Acute Care

## 2019-04-15 ENCOUNTER — Ambulatory Visit (HOSPITAL_COMMUNITY)
Admission: RE | Admit: 2019-04-15 | Discharge: 2019-04-15 | Disposition: A | Discharge status: Home / Self Care | Payer: Medicare Other | Source: Ambulatory Visit | Attending: Acute Care | Admitting: Acute Care

## 2019-04-15 DIAGNOSIS — R7989 Other specified abnormal findings of blood chemistry: Secondary | ICD-10-CM

## 2019-04-15 DIAGNOSIS — R0602 Shortness of breath: Secondary | ICD-10-CM | POA: Insufficient documentation

## 2019-04-15 DIAGNOSIS — R911 Solitary pulmonary nodule: Secondary | ICD-10-CM

## 2019-04-15 LAB — BASIC METABOLIC PANEL
BUN: 22 mg/dL (ref 6–23)
CO2: 31 mEq/L (ref 19–32)
Calcium: 9.9 mg/dL (ref 8.4–10.5)
Chloride: 101 mEq/L (ref 96–112)
Creatinine, Ser: 1.22 mg/dL (ref 0.40–1.50)
GFR: 57.73 mL/min — ABNORMAL LOW (ref >60.00)
Glucose, Bld: 92 mg/dL (ref 70–99)
Potassium: 4.3 mEq/L (ref 3.5–5.1)
Sodium: 138 mEq/L (ref 135–145)

## 2019-04-15 LAB — CBC WITH DIFFERENTIAL/PLATELET
Basophils Absolute: 0 10*3/uL (ref 0.0–0.1)
Basophils Relative: 0.8 % (ref 0.0–3.0)
Eosinophils Absolute: 0.2 10*3/uL (ref 0.0–0.7)
Eosinophils Relative: 4.4 % (ref 0.0–5.0)
HCT: 38 % — ABNORMAL LOW (ref 39.0–52.0)
Hemoglobin: 12.9 g/dL — ABNORMAL LOW (ref 13.0–17.0)
Lymphocytes Relative: 29.8 % (ref 12.0–46.0)
Lymphs Abs: 1.4 10*3/uL (ref 0.7–4.0)
MCHC: 34 g/dL (ref 30.0–36.0)
MCV: 101.5 fl — ABNORMAL HIGH (ref 78.0–100.0)
Monocytes Absolute: 0.3 10*3/uL (ref 0.1–1.0)
Monocytes Relative: 7.6 % (ref 3.0–12.0)
Neutro Abs: 2.6 10*3/uL (ref 1.4–7.7)
Neutrophils Relative %: 57.4 % (ref 43.0–77.0)
Platelets: 134 10*3/uL — ABNORMAL LOW (ref 150.0–400.0)
RBC: 3.74 Mil/uL — ABNORMAL LOW (ref 4.22–5.81)
RDW: 13 % (ref 11.5–15.5)
WBC: 4.6 10*3/uL (ref 4.0–10.5)

## 2019-04-15 MED ORDER — IOHEXOL 350 MG/ML SOLN
100.0000 mL | Freq: Once | INTRAVENOUS | Status: AC | PRN
  Administered 2019-04-15: 16:00:00 100 mL via INTRAVENOUS

## 2019-04-15 MED ORDER — SODIUM CHLORIDE (PF) 0.9 % IJ SOLN
INTRAMUSCULAR | Status: AC
  Filled 2019-04-15: qty 50

## 2019-04-15 NOTE — Telephone Encounter (Signed)
Spoke with pt's wife and gave her the results. She understood and agreed to have CTA. I placed a STAT order to have CTA done. FYI SG.

## 2019-04-15 NOTE — Telephone Encounter (Signed)
[place] calling back with a stat call report on this pt he waiting [phone].John Ball

## 2019-04-15 NOTE — Telephone Encounter (Signed)
Call returned to [place] with [facility], made aware the order has been signed. Nothing further needed at this time.

## 2019-04-15 NOTE — Telephone Encounter (Signed)
Please call patient and let him know he had an abnormally elevated d dimer.Let him know we are going to order a CTA. Please order and schedule a CTA  . He had a BMET done yesterday in the office that has not resulted yet.This is for elevated d dimer and increasing dyspnea/ tachycardia. Thanks so much

## 2019-04-15 NOTE — Telephone Encounter (Signed)
Called and spoke to radiology tech. She states the pts CTa came back negative for PE. However, nodules were noted on the scan. Will send to Judson Roch to advise. Report is below.   IMPRESSION: 1. New 5 mm RIGHT middle lobe nodule. Given previous history of lung cancer and underlying emphysema, recommend short-term follow-up in 3-6 months. Given the size of this nodule, PET CT may or may not be helpful for further evaluation. 2. No evidence of pulmonary emboli. 3. Coronary artery disease and CABG changes 4. Aortic Atherosclerosis (ICD10-I70.0) and Emphysema (ICD10-J43.9).

## 2019-04-16 NOTE — Progress Notes (Signed)
Note Notified spouse results CTA 39mm lung nodule need for CT in 3 mos Resulted cxr with no acute changes Order placed for CT Nothing further needed.  Just making sure this was called to the patient. If not let him know there was no blood clot. Let him know there is a new nodule that we need to recheck in 3 months. Please place an order for a 3 month follow up CT Chest. Thanks so much

## 2019-04-16 NOTE — Telephone Encounter (Signed)
John Ball you want CT with or without contrast?   Notes recorded by Stephanie Coup, CMA on 04/16/2019 at 10:40 AM EDT  Note  Notified spouse results CTA 86mm lung nodule need for CT in 3 mos  Resulted cxr with no acute changes  Order placed for CT  Nothing further needed

## 2019-04-16 NOTE — Telephone Encounter (Signed)
without

## 2019-04-16 NOTE — Telephone Encounter (Signed)
Just making sure this was called to the patient. If not let him know there was no blood clot. Let him know there is a new nodule that we need to recheck in 3 months. Please place an order for a 3 month follow up CT Chest. Thanks so much

## 2019-04-16 NOTE — Telephone Encounter (Signed)
Order has been placed.

## 2019-04-16 NOTE — Progress Notes (Signed)
Please call patient and let him know he had an abnormally elevated d dimer.Let him know we are going to order a CTA. Please order and schedule a CTA  . He had a BMET done yesterday in the office that has not resulted yet.This is for elevated d dimer and increasing dyspnea/ tachycardia. Thanks so much  CTA performed and resulted.

## 2019-04-19 ENCOUNTER — Telehealth: Payer: Self-pay | Admitting: Acute Care

## 2019-04-19 NOTE — Progress Notes (Signed)
Recall has been placed for f/u in Jan 2021/3 mos. Pt was given results of CTA in hospital Nothing further needed.

## 2019-04-19 NOTE — Telephone Encounter (Signed)
Please make sure patient has a 3 month follow up with Dr. Halford Chessman after the 3 month follow up CT Chest. Thanks so much

## 2019-04-19 NOTE — Telephone Encounter (Signed)
VS schedule not opened Recall has already been placed for 3 mos and f/u with [facility]. Nothing further needed.

## 2019-04-20 ENCOUNTER — Telehealth: Payer: Self-pay | Admitting: Acute Care

## 2019-04-20 ENCOUNTER — Ambulatory Visit: Payer: Medicare Other | Admitting: Gastroenterology

## 2019-04-20 NOTE — Telephone Encounter (Signed)
Called [facility] [phone] [name]) requested update on the order status [facility] acknowledged on 04/15/19. [name] requested my call back number [phone] desk phone). She stated she will have to check into it and will call right back.

## 2019-04-20 NOTE — Telephone Encounter (Signed)
Received a call back from [place]. And she stated the replacement team has been trying to reach the patient because the equipment has to be swapped out every 60 months/5 years. But when they try to reach the patient he is not receptive to calls.  She confirmed since the patient already has oxygen, it is only a liter flow change. [name] confirmed she will contact the replacement team and ask them to contact the patient.  I told [name] I would call and talk to the patient or his wife John Ball and let them know what was going on and to look out for the call. _________________  I called and spoke with patient wife John Ball. Advised her that I talked with [facility] and told her of the information above. She stated she was calling because someone from [employer] called about a bill that was not paid for $3,500 and that he needed to be seen in our office. John Ball stated the rep from [employer] told her "the doctor's office is not doing there job it we don't have what we need". John Ball said they have been coming to appointments and he had a qualifying walk, so she was upset the rep keeps calling about something that she cannot answer.  I told John Ball that I will call back [employer] and advise of her concern. She voiced understanding. Nothing further needed at this time. __________________  I had to leave a voicemail for [name] with [employer] [phone]) to let her know the patient was being contacted about an outstanding balance and being told they do not have information from our office. Advised in the message the patient has been seen in our clinic 4 times this year and walked at last visit. So not sure if [facility] has access to the records (they way [place] does). Asked her to call back if there are any questions.

## 2019-07-16 ENCOUNTER — Other Ambulatory Visit: Payer: Medicare Other

## 2019-07-28 ENCOUNTER — Ambulatory Visit
Admission: RE | Admit: 2019-07-28 | Discharge: 2019-07-28 | Disposition: A | Discharge status: Home / Self Care | Payer: Medicare Other | Source: Ambulatory Visit | Attending: Acute Care | Admitting: Acute Care

## 2019-07-28 DIAGNOSIS — R911 Solitary pulmonary nodule: Secondary | ICD-10-CM | POA: Diagnosis not present

## 2019-07-29 NOTE — Progress Notes (Signed)
Please place order for CT Chest without contrast for 07/2020. Thanks

## 2019-07-29 NOTE — Progress Notes (Signed)
Please call patient and let him know his CT Chest showed that the nodule we are following is stable, and has not changed in size.Marland Kitchen Let him know there is a very small new nodule that we will need to continue to follow annually.  Thanks so much

## 2019-08-05 ENCOUNTER — Other Ambulatory Visit: Payer: Self-pay

## 2019-08-05 DIAGNOSIS — R911 Solitary pulmonary nodule: Secondary | ICD-10-CM

## 2019-08-19 DIAGNOSIS — D485 Neoplasm of uncertain behavior of skin: Secondary | ICD-10-CM | POA: Diagnosis not present

## 2019-08-19 DIAGNOSIS — B079 Viral wart, unspecified: Secondary | ICD-10-CM | POA: Diagnosis not present

## 2019-11-22 ENCOUNTER — Ambulatory Visit (INDEPENDENT_AMBULATORY_CARE_PROVIDER_SITE_OTHER): Payer: Medicare Other | Admitting: Pulmonary Disease

## 2019-11-22 ENCOUNTER — Other Ambulatory Visit: Payer: Self-pay

## 2019-11-22 ENCOUNTER — Encounter: Payer: Self-pay | Admitting: Pulmonary Disease

## 2019-11-22 VITALS — BP 124/60 | HR 68 | Temp 96.8°F | Ht 72.0 in | Wt 187.2 lb

## 2019-11-22 DIAGNOSIS — R911 Solitary pulmonary nodule: Secondary | ICD-10-CM | POA: Diagnosis not present

## 2019-11-22 DIAGNOSIS — Z720 Tobacco use: Secondary | ICD-10-CM

## 2019-11-22 DIAGNOSIS — J9611 Chronic respiratory failure with hypoxia: Secondary | ICD-10-CM

## 2019-11-22 DIAGNOSIS — J432 Centrilobular emphysema: Secondary | ICD-10-CM | POA: Diagnosis not present

## 2019-11-22 NOTE — Progress Notes (Signed)
[facility]  Chief Complaint  Patient presents with  . Follow-up    Constitutional:  BP 124/60 (BP Location: Right Arm, Cuff Size: Normal)   Pulse 68   Temp (!) 96.8 F (36 C) (Temporal)   Ht 6' (1.829 m)   Wt 187 lb 3.2 oz (84.9 kg)   BMI 25.39 kg/m   Past Medical History:  CAD s/p CABG, PTSD, PAD, OSA intolerant of CPAP, Neuropathy, HLD, DVT, diastolic CHF, HTN, Depression, OA, Anxiety  Brief Summary:  John Ball is a 79 y.o. male smoker with COPD with emphysema and chronic bronchitis, and chronic respiratory failure with hypoxia.  He has hx of NSCLC s/p Rt lower lobectomy.  Subjective:  He is down to 1/2 ppd.  Has occasional cough with clear sputum and wheeze.  No hemoptysis.  Not very active.  Gets winded quickly.  Got COVID shots.  Physical Exam:   Appearance - wearing oxygen  ENMT - no sinus tenderness, no oral exudate, no LAN, Mallampati 2 airway, no stridor  Respiratory - equal breath sounds bilaterally, no wheezing or rales  CV - s1s2 regular rate and rhythm, no murmurs  Ext - no clubbing, no edema  Skin - no rashes  Psych - normal mood and affect  Assessment/Plan:   COPD with emphysema and chronic bronchitis. - continue spiriva, advair, prn albuterol - he will check with cardiology at appointment in July whether he can enroll in cardiopulmonary rehab  Chronic respiratory failure with hypoxia. - he has PaO2 < 60 and chronic leg edema in setting of COPD - 3 liters oxygen 24/7  Lung nodules. - he has f/u CT scheduled  Tobacco abuse. - he will try to gradually quit on his own  Follow up:   Patient Instructions  Check with your heart doctor about whether you can enroll in cardiopulmonary rehab program  Follow up in 6 months   Signature:  Chesley Mires, MD [place] Pager: [phone] 11/22/2019, 11:25 AM  Flow Sheet     Pulmonary tests:  Spirometry 03/05/16 >> FEV1  1.21 (36%), FEV1% 54% ABG 03/07/16 >> pH 7.40, PaCO2 42.6, PaO2 59.3  Chest imaging:  CT chest 04/20/15 >> atherosclerosis, mild centrilobular/paraseptal emphysema, s/p RLL ectomy CT angio chest 09/12/17 >> thickened Rt diaphragm, s/p RLLectomy, tree in bud CT chest 08/19/18 >> centrilobular/paraseptal emphysema, volume loss Rt hemithorax, decreased LAN CT chest 07/28/19 >> stable 4 mm RML nodule, new 3 mm nodule in lingula  Cardiac tests:  Echo 09/26/17 >> EF 50 to 55%, grade 1 DD, PAS 36 mmHg  Medications:   Allergies as of 11/22/2019      Reactions   Aspirin Other (See Comments)   Avoids due to severe bruises   Crestor [rosuvastatin Calcium] Other (See Comments)   myalgia   Lipitor [atorvastatin Calcium] Other (See Comments)   myalgia   Zetia [ezetimibe] Other (See Comments)   myalgia   Zocor [simvastatin - High Dose] Other (See Comments)   myalgia   Aripiprazole Other (See Comments)   Altered mental status   Effexor [venlafaxine Hydrochloride] Itching   Morphine And Related Itching      Medication List       Accurate as of Nov 22, 2019 11:25 AM. If you have any questions, ask your nurse or doctor.        acetaminophen 500 MG tablet Commonly known as: TYLENOL Take 1,000 mg by mouth every 6 (six) hours as needed for moderate pain  or headache.   Advair Diskus 500-50 MCG/DOSE Aepb Generic drug: Fluticasone-Salmeterol Inhale 1 puff into the lungs 2 (two) times daily.   albuterol 108 (90 Base) MCG/ACT inhaler Commonly known as: VENTOLIN HFA Inhale 1 puff into the lungs every 6 (six) hours as needed for wheezing or shortness of breath.   albuterol (2.5 MG/3ML) 0.083% nebulizer solution Commonly known as: PROVENTIL   DULoxetine 30 MG capsule Commonly known as: CYMBALTA Take 60 mg by mouth daily. 60mg  (2 capsules) every morning   Hydrogel Gel 1 g by Does not apply route daily.   lidocaine 5 % Commonly known as: Lidoderm Place 3 patches onto the skin every 12  (twelve) hours. Remove & Discard patch within 12 hours or as directed by MD   nitroGLYCERIN 0.4 MG SL tablet Commonly known as: NITROSTAT Place 1 tablet (0.4 mg total) under the tongue every 5 (five) minutes as needed for chest pain.   OXYGEN Inhale 2 L into the lungs See admin instructions. Uses 2L at bedtime and if needed throughout the day -- intolerant to CPAP   prazosin 1 MG capsule Commonly known as: MINIPRESS Take 1 mg by mouth at bedtime.   pregabalin 75 MG capsule Commonly known as: Lyrica Take 1 capsule (75 mg total) by mouth 3 (three) times daily.   QUEtiapine 300 MG 24 hr tablet Commonly known as: SEROQUEL XR Take 300 mg by mouth at bedtime.   ramipril 2.5 MG capsule Commonly known as: ALTACE Take 2.5 mg by mouth every morning.   Spiriva HandiHaler 18 MCG inhalation capsule Generic drug: tiotropium Place 18 mcg into inhaler and inhale daily.   traZODone 150 MG tablet Commonly known as: DESYREL Take 0.5 tablets (75 mg total) by mouth at bedtime as needed for sleep. What changed:   how much to take  when to take this   Vitamin D3 25 MCG (1000 UT) Caps Take 1,000 Units by mouth every morning.   zolpidem 12.5 MG CR tablet Commonly known as: AMBIEN CR Take 12.5 mg by mouth at bedtime.       Past Surgical History:  He  has a past surgical history that includes Femoral-tibial Bypass Graft (Right, 01/10/2014); left heart catheterization with coronary/graft angiogram (N/A, 07/12/2014); Coronary artery bypass graft (07/27/2002); Cardiovascular stress test (last one  06-17-2014   dr  harding); Tonsillectomy; Femoral artery - popliteal artery bypass graft (Right, 10-24-2008   dr early); RIGHT ABOVE KNEE POPLITEAL GRAFT TO BELOW KNEE POPLITEAL ARTERY BYPASS WITH REVERSE SAPHENOUS VEIN (03-02-2010); THROMBECTOMY OF RIGHT FEMORAL TO ABOVE KNEE POPLITEAL GORETEX GRAFT ANGIOPLASTY OF GORETEX AND SAPHENOUS VEIN JUNCTION AND ABOVE KNEE POPLITEAL ARTERY  (10-06-2010   DR EARLY);  Video assisted thoracoscopy (vats)/wedge resection (Right, 05-01-2011    [facility]); Cardiac catheterization (07-13-2002); LEFT ANKLE SURGERY (1990's); Hydrocele surgery (Left); Eye surgery (Left, Nov 2013); Colonoscopy (2008    WNL); Transurethral resection of bladder tumor (N/A, 02/21/2015); Cystoscopy w/ ureteral stent placement (N/A, 02/21/2015); NM MYOVIEW LTD (11/'15; 3/'19, 5/'20); Lower Extremity Angiography (Bilateral, 11/12/2017); ABDOMINAL AORTOGRAM (N/A, 11/12/2017); Endarterectomy femoral (Left, 11/17/2017); Wound exploration (Left, 11/28/2017); Application if wound vac (Left, 12/03/2017); ABDOMINAL AORTOGRAM W/LOWER EXTREMITY (Left, 02/10/2018); PERIPHERAL VASCULAR INTERVENTION (Left, 02/10/2018); Femoral-popliteal Bypass Graft (Left, 02/11/2018); and transthoracic echocardiogram (09/2017).  Family History:  His family history includes Alzheimer's disease (age of onset: 33) in his father; Other (age of onset: 62) in his mother; Stroke in his father and mother.  Social History:  He  reports that he has been smoking cigarettes. He  started smoking about 62 years ago. He has a 90.00 pack-year smoking history. He has never used smokeless tobacco. He reports that he does not drink alcohol or use drugs.

## 2019-11-22 NOTE — Patient Instructions (Signed)
Check with your heart doctor about whether you can enroll in cardiopulmonary rehab program  Follow up in 6 months

## 2020-01-31 ENCOUNTER — Encounter: Payer: Self-pay | Admitting: Cardiology

## 2020-01-31 ENCOUNTER — Ambulatory Visit (INDEPENDENT_AMBULATORY_CARE_PROVIDER_SITE_OTHER): Payer: Medicare Other | Admitting: Cardiology

## 2020-01-31 ENCOUNTER — Other Ambulatory Visit: Payer: Self-pay

## 2020-01-31 VITALS — BP 140/66 | HR 74 | Temp 94.3°F | Ht 72.0 in | Wt 188.2 lb

## 2020-01-31 DIAGNOSIS — I25709 Atherosclerosis of coronary artery bypass graft(s), unspecified, with unspecified angina pectoris: Secondary | ICD-10-CM | POA: Diagnosis not present

## 2020-01-31 DIAGNOSIS — E785 Hyperlipidemia, unspecified: Secondary | ICD-10-CM | POA: Diagnosis not present

## 2020-01-31 DIAGNOSIS — Z951 Presence of aortocoronary bypass graft: Secondary | ICD-10-CM

## 2020-01-31 DIAGNOSIS — I10 Essential (primary) hypertension: Secondary | ICD-10-CM | POA: Diagnosis not present

## 2020-01-31 DIAGNOSIS — I2510 Atherosclerotic heart disease of native coronary artery without angina pectoris: Secondary | ICD-10-CM | POA: Diagnosis not present

## 2020-01-31 NOTE — Progress Notes (Signed)
Primary Care Provider: Lorrene Reid, PA-C Cardiologist: John Hew, MD Electrophysiologist: None  Vascular surgery: Dr. Donnetta [facility]  Clinic Note: Chief Complaint  Patient presents with   Follow-up    Annual   Coronary Artery Disease    No angina   HPI:    John [facility] is a 79 y.o. male with a PMH notable for CAD-CABG, COPD(Centrilobular Emphysema)., PAD, hypertension and hyperlipidemia who presents today for annual follow-up.   Cardiac Catheterization January 2016 revealed occluded native RCA and occluded vein graft RCA. No PCI. Recommended medical management.  Myoview Stress Test (September 12, 2017):EF 42%. Inferior, inferoseptal defect consistent with scar. No ischemia. Read as Intermediate Risk because of low EF. Echo ordered  Echo (September 26, 2017): EF 50-55%. More accurate - Mild inferolateral HK (c/w known RCA occlusion). GR 1 DD. Mild LA dilation mildly increased PA pressures with mild RV/RA dilation.  Myoview checked by John [facility] ~May 2020 -- NON-ischemic per report. --Unfortunately, report not available.  [facility]  John [facility] was last seen in July 2020 --> no cardiac symptoms of angina or heart failure concern.  Was having intermittent bouts of bronchitis.  Using rescue inhaler for wheezing.  Noted claudication.  He had also had issues of a recent bout with shingles.  Some neuropathy.  Recent Hospitalizations: none  Reviewed  CV studies:    The following studies were reviewed today: (if available, images/films reviewed: From Epic Chart or Care Everywhere)  ABIs ordered by vascular surgery.   Interval History:   John [facility] is here today for routine follow-up.  He says he is definitely getting more progressively short of breath, but slowly.  He is on 3 L of home oxygen all the time.  For any longer distance walking he uses a scooter.  If he walks more than 10 feet he has to stop and rest to catch his breath.  However this is not  that much different than before.  He does not really do very much in the way of any activity, he has mild varicose vein swelling in his legs but no real edema.  No PND or orthopnea.  No exertional chest pain just dyspnea.  No arrhythmia symptoms.  CV Review of Symptoms (Summary) Cardiovascular ROS: positive for - dyspnea on exertion, edema and shortness of breath negative for - chest pain, irregular heartbeat, orthopnea, palpitations, paroxysmal nocturnal dyspnea, rapid heart rate or Syncope/near syncope or TIA/amaurosis fugax, claudication  The patient does not have symptoms concerning for COVID-19 infection (fever, chills, cough, or new shortness of breath).  The patient is practicing social distancing & Masking.  * Has had both Covid shots - (both he & wife) John [facility] (2/10, 3/10) [facility]  REVIEWED OF SYSTEMS   Review of Systems  Constitutional: Negative for malaise/fatigue and weight loss.  HENT: Positive for hearing loss. Negative for nosebleeds.   Respiratory: Positive for cough, shortness of breath (Baseline) and wheezing (Despite this, he does not routinely uses rescue inhaler.).   Gastrointestinal: Negative for blood in stool and melena.  Genitourinary: Negative for hematuria.  Musculoskeletal: Positive for joint pain.  Neurological: Positive for tingling (Neuropathy). Negative for focal weakness.  Psychiatric/Behavioral: Positive for memory loss (Mild). The patient is not nervous/anxious and does not have insomnia.    PAST MEDICAL HISTORY   Past Medical History:  Diagnosis Date   Anxiety    Arthritis    Complication of anesthesia    hx prolonged post op oxygen dependent/  hx hallucinations for 2 day post op lobectomy 2012   COPD, severe (HCC)    Depression    Essential hypertension    Hearing loss of both ears    does wear his hearing aids   History of arterial bypass of lower extremity    RIGHT FEM-POP   History of CHF (congestive heart failure)     History of DVT of lower extremity    History of panic attacks    Hyperlipidemia with target LDL less than 70    Left main coronary artery disease 2004   a) Severe LM CAD 2004 -->s/p  cabg x4 (LIMA-LAD, free RIMA-RI, SVG-OM 2, SVG-RPDA); b) 06/2014: Abnormal Myoview --> c)cardiac Cath Jan 2016: pLM 70%, pLAD 40-50%- patent LIMA-LAD.  RI -competitive flow noted from RIMA-RI graft, circumflex, normal caliber with moderate OM1.  OM 2 occluded.  SVG-OM 2 patent. 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    does not wear his dentures   Nocturia    OSA (obstructive sleep apnea)    uses O2  via [place]  --  intolerate to CPAP   Peripheral arterial occlusive disease ([place])    2011   a) Gore-Tex graft right AK popA-BK popA --> b) 2012: thrombosed graft --> thrombectomy with dacron patch = 1 V runnof via Peroneal; c) 01/2014: R femoropopliteal bypass with left femoral vein;; followed by dr Early--  per last dopplers 06-28-2014 no change right graft,  50-74% stenosis common and left mid superficial femoral artery   PTSD (post-traumatic stress disorder)    anxiety attack's---  [place] [employer]   Recurrent productive cough    SMOKER'S COUGH   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--- a) 04/2011: R L Lobectomy & med node dissection ([id number]) - [facility] [place], [place]) w/o post-op Rx; b) CT-A Chest Jan 2013: R pl effusion, R hilar LAN (3.3 cm x 2.8 cm & 2.2 cm x 1.3 cm); c) 2/'13 PET-CT Chest: Bilat Hilar LAN, no distant Mets  d) CT chest 9/'14: Stable shotty hilar nodes  bilaterally w/ no pathologic sized LAD or suspicious Pulm nodule; ;;  (oncologist at   Wears glasses     PAST SURGICAL HISTORY   Past Surgical History:  Procedure Laterality Date   ABDOMINAL AORTOGRAM N/A 11/12/2017   Procedure: ABDOMINAL AORTOGRAM;  Surgeon: Waynetta Sandy, MD;  Location: [facility];  Service: Cardiovascular;  Laterality: N/A;   ABDOMINAL AORTOGRAM W/LOWER EXTREMITY Left 02/10/2018   Procedure: ABDOMINAL AORTOGRAM W/LOWER EXTREMITY;  Surgeon: Serafina Mitchell, MD;  Location: [facility];  Service: Cardiovascular;  Laterality: Left;   APPLICATION OF WOUND VAC Left 12/03/2017   Procedure: EXCHANGE OF WOUND VAC LEFT GROIN,;  Surgeon: Rosetta Posner, MD;  [facility];  Service: Vascular;  Laterality: Left;   CARDIAC CATHETERIZATION  07-13-2002   Ischemia RCA region on Cardiolite// 60-70% ostial left main, 50% midCx, 60-70% mid RI,  70% mRCA, 90% JeNu and crux 75% RCA ,  95% PLA//  severe LM and 3Vessel CAD, perserved LV, ef 60%   CARDIOVASCULAR STRESS TEST  last one  06-17-2014   dr  Ellyn Hack   Carlton Adam study with no exercise; Intermediate Risk Study;   moderate size and intensity, partially reversible inferior septal defect consistent with prior infarct and mild to moderate peri-infarct ischemia;  mild hypokinesis of the inferior septal wall,  normal LV function, ef 56%   COLONOSCOPY  2008    WNL   CORONARY ARTERY BYPASS GRAFT  07/27/2002   LIMA-LAD, freeRIMA-RI,SVG-OM2, SVG-rPDA; Dr. Roxan Hockey   CYSTOSCOPY W/ URETERAL STENT PLACEMENT N/A 02/21/2015   Procedure: BILATERAL RETROGRADE PYELOGRAM;  Surgeon: Festus Aloe, MD;  Location: [facility];  Service: Urology;  Laterality: N/A;   ENDARTERECTOMY FEMORAL Left 11/17/2017   Procedure: LEFT Femoral Endarterectomy with Patch Angioplasty;  Surgeon: Rosetta Posner, MD;  Location: [facility];  Service: Vascular;  Laterality: Left;   EYE SURGERY Left Nov 2013   FEMORAL ARTERY -  POPLITEAL ARTERY BYPASS GRAFT Right 10-24-2008   dr early   w/ GoreTex graft   FEMORAL-POPLITEAL BYPASS GRAFT Left 02/11/2018   Procedure: BYPASS GRAFT LOWER EXTREMITY;  Surgeon: Rosetta Posner, MD;  [facility];  Service: Vascular;  Laterality: Left;   FEMORAL-TIBIAL BYPASS GRAFT Right 01/10/2014   Procedure: Right Femoral to Posterior Tibial Bypass Graft using Left Leg Vein, Thrombectomy Right Common Femoral and Profunda of Leg . ;  Surgeon: Rosetta Posner, MD;  Location: [facility];  Service: Vascular;  Laterality: Right;   HYDROCELE EXCISION Left    LEFT ANKLE SURGERY  1990's   LEFT HEART CATHETERIZATION WITH CORONARY/GRAFT ANGIOGRAM N/A 07/12/2014   Procedure: LEFT HEART CATHETERIZATION WITH Beatrix Fetters;  Surgeon: Troy Sine, MD;  Location: [facility];  Service: Cardiovascular;  Laterality: N/A;  pLM 70%, pLAD 40-50%- patent LIMA-LAD.  RI -competitive flow noted from fRIMA-RI graft, circumflex, normal caliber with moderate OM1.  OM 2 occluded.  SVG-OM 2 patent. RCA CTO after large RVM, faint R-R and LAD septal-PDA collateral   LOWER EXTREMITY ANGIOGRAPHY Bilateral 11/12/2017   Procedure: LOWER EXTREMITY ANGIOGRAPHY - Left Lower;  Surgeon: Waynetta Sandy, MD;  Location: [facility];  Service: Cardiovascular;  Laterality: Bilateral;   NM MYOVIEW LTD  11/'15; 3/'19, 5/'20   a) Mod sized, mod intensity/partially reversible inferior defect c/w  prior infarct mild/mod peri-infarct ischemia.  Inferoseptal HK.  INTERMEDIATE RISK. --> Cath showed CTO of Native RCA & SVG-rPDA.; b) EF ~42%. Inferior-Inferoseptal defect - likely old scar - INTERMEDIATE RISK b/c low EF (Echo EF 50-55%) - med Rx; c) [place] -by report - Non-ischemic.   PERIPHERAL VASCULAR INTERVENTION Left 02/10/2018   Procedure: PERIPHERAL VASCULAR INTERVENTION;  Surgeon: Serafina Mitchell, MD;  Location: [facility];  Service: Cardiovascular;  Laterality: Left;  left common iliac stent, attempted  left SFA stent   RIGHT ABOVE KNEE POPLITEAL GRAFT TO BELOW KNEE POPLITEAL ARTERY BYPASS WITH REVERSE SAPHENOUS VEIN  03-02-2010   THROMBECTOMY OF RIGHT FEMORAL TO ABOVE KNEE POPLITEAL GORETEX GRAFT ANGIOPLASTY OF GORETEX AND SAPHENOUS VEIN JUNCTION AND ABOVE KNEE POPLITEAL ARTERY   10-06-2010   DR EARLY   TONSILLECTOMY     TRANSTHORACIC ECHOCARDIOGRAM  09/2017   EF 50-55%.  More accurate - Mild inferolateral HK (c/w known RCA occlusion).  GR 1 DD.  Mild LA dilation mildly increased PA pressures with mild RV/RA dilation.   TRANSURETHRAL RESECTION OF BLADDER TUMOR N/A 02/21/2015  Procedure: TRANSURETHRAL RESECTION OF BLADDER TUMOR (TURBT);  Surgeon: Festus Aloe, MD;  Location: [facility];  Service: Urology;  Laterality: N/A;   VIDEO ASSISTED THORACOSCOPY (VATS)/WEDGE RESECTION Right 05-01-2011    FORSYTH   LOWER LOBECTOMY W/  NODE DISSECTION   WOUND EXPLORATION Left 11/28/2017   Procedure: WOUND EXPLORATION GROIN, WASHOUT AND WOUND VAC APPLICATION;  Surgeon: Conrad Lovelady, MD;  Location: [facility];  Service: Vascular;  Laterality: Left;    MEDICATIONS/ALLERGIES   Current Meds  Medication Sig   acetaminophen (TYLENOL) 500 MG tablet Take 1,000 mg by mouth every 6 (six) hours as needed for moderate pain or headache.    ADVAIR DISKUS 500-50 MCG/DOSE AEPB Inhale 1 puff into the lungs 2 (two) times daily.    albuterol (PROVENTIL HFA;VENTOLIN HFA) 108 (90 BASE) MCG/ACT inhaler Inhale 1 puff into the lungs every 6 (six) hours as needed for wheezing or shortness of breath.   albuterol (PROVENTIL) (2.5 MG/3ML) 0.083% nebulizer solution    Carbomer Gel Base (HYDROGEL) GEL 1 g by Does not apply route daily.   Cholecalciferol (VITAMIN D3) 1000 UNITS CAPS Take 1,000 Units by mouth every morning.    CYMBALTA 60 MG capsule Take 60 mg by mouth daily.   furosemide (LASIX) 20 MG tablet Take 20 mg by mouth as needed.   gabapentin (NEURONTIN) 100 MG capsule Take 100 mg by mouth  daily.   lidocaine (LIDODERM) 5 % Place 3 patches onto the skin every 12 (twelve) hours. Remove & Discard patch within 12 hours or as directed by MD   mupirocin ointment (BACTROBAN) 2 % SMARTSIG:1 Application Topical 2-3 Times Daily   nitroGLYCERIN (NITROSTAT) 0.4 MG SL tablet Place 1 tablet (0.4 mg total) under the tongue every 5 (five) minutes as needed for chest pain.   OXYGEN Inhale 2 L into the lungs See admin instructions. Uses 2L at bedtime and if needed throughout the day -- intolerant to CPAP   prazosin (MINIPRESS) 1 MG capsule Take 1 mg by mouth at bedtime.   pregabalin (LYRICA) 75 MG capsule Take 1 capsule (75 mg total) by mouth 3 (three) times daily.   QUEtiapine (SEROQUEL XR) 300 MG 24 hr tablet Take 300 mg by mouth at bedtime.    ramipril (ALTACE) 2.5 MG capsule Take 2.5 mg by mouth every morning.    SYMBICORT 160-4.5 MCG/ACT inhaler Inhale 1 puff into the lungs as needed.   tiotropium (SPIRIVA HANDIHALER) 18 MCG inhalation capsule Place 18 mcg into inhaler and inhale daily.   traZODone (DESYREL) 150 MG tablet Take 0.5 tablets (75 mg total) by mouth at bedtime as needed for sleep. (Patient taking differently: Take 150 mg by mouth at bedtime. )   vitamin B-12 (CYANOCOBALAMIN) 500 MCG tablet Take 1 tablet by mouth daily.   zolpidem (AMBIEN CR) 12.5 MG CR tablet Take 12.5 mg by mouth at bedtime.   [DISCONTINUED] DULoxetine (CYMBALTA) 30 MG capsule Take 60 mg by mouth daily. 60mg  (2 capsules) every morning    Allergies  Allergen Reactions   Aspirin Other (See Comments)    Avoids due to severe bruises   Crestor [Rosuvastatin Calcium] Other (See Comments)    myalgia   Lipitor [Atorvastatin Calcium] Other (See Comments)    myalgia   Zetia [Ezetimibe] Other (See Comments)    myalgia   Zocor [Simvastatin - High Dose] Other (See Comments)    myalgia   Aripiprazole Other (See Comments)    Altered mental status   Effexor [Venlafaxine Hydrochloride] Itching  Morphine And Related Itching    SOCIAL HISTORY/FAMILY HISTORY   Reviewed in Epic:  Pertinent findings: no new changes.   OBJCTIVE -PE, EKG, labs   Wt Readings from Last 3 Encounters:  01/31/20 188 lb 3.2 oz (85.4 kg)  11/22/19 187 lb 3.2 oz (84.9 kg)  04/14/19 183 lb 6.4 oz (83.2 kg)    Physical Exam: BP (!) 140/66    Pulse 74    Temp (!) 94.3 F (34.6 C)    Ht 6' (1.829 m)    Wt 188 lb 3.2 oz (85.4 kg)    SpO2 96%    BMI 25.52 kg/m   -- usually lower BP.  Physical Exam Vitals reviewed.  Constitutional:      General: He is not in acute distress.    Appearance: He is normal weight. He is not ill-appearing.     Comments: Appears older than stated age.  Well-groomed.  Smells like cigarettes.  HENT:     Head: Normocephalic and atraumatic.     Mouth/Throat:     Comments: Poor dentition Cardiovascular:     Rate and Rhythm: Normal rate. Occasional extrasystoles are present.    Pulses: Decreased pulses (Decreased pedal pulses).     Heart sounds: Normal heart sounds. No murmur heard.  No friction rub. No gallop.   Pulmonary:     Effort: Pulmonary effort is normal. No respiratory distress.     Breath sounds: Wheezing present. No rhonchi or rales.     Comments: Diffuse mild wheezing but no rales or rhonchi. Chest:     Chest wall: No tenderness.  Musculoskeletal:        General: Normal range of motion.  Neurological:     General: No focal deficit present.     Mental Status: He is alert and oriented to person, place, and time.  Psychiatric:        Mood and Affect: Mood normal.        Behavior: Behavior normal.        Thought Content: Thought content normal.        Judgment: Judgment normal.      Adult ECG Report  Rate: 74 ;  Rhythm: normal sinus rhythm, premature ventricular contractions (PVC) and 1 degree AVB.  ST&T wave changes consider inferior ischemia.;   Narrative Interpretation: Stable EKG  Recent Labs: Due for labs to be checked Lab Results  Component Value Date    CHOL 166 03/24/2019   HDL 45 03/24/2019   LDLCALC 105 (H) 03/24/2019   TRIG 85 03/24/2019   CHOLHDL 3.8 09/25/2017   Lab Results  Component Value Date   CREATININE 1.22 04/14/2019   BUN 22 04/14/2019   NA 138 04/14/2019   K 4.3 04/14/2019   CL 101 04/14/2019   CO2 31 04/14/2019   Lab Results  Component Value Date   TSH 3.260 03/24/2019    ASSESSMENT/PLAN   Overall Attilio is relatively stable overall.  He is extremely limited with his dyspnea and is on chronic home oxygen.  Unable to walk more than 10 steps at a time.  Not having angina, just dyspnea.  No other heart failure type symptoms.  Reluctant to take additional medications.  For now we will simply continue to monitor her current medications.  Problem list based recommendations below.  While he understands that smoking cessation is important, he does not have any intention of stopping.  Problem List Items Addressed This Visit    Left main coronary artery disease (Chronic)  Severe native disease from left main and other native vessels.  He still continues to smoke, but it seems like COPD is more limiting than angina.  Not on beta-blocker because of COPD. Plan: Continue very low-dose ramipril.  He is no longer on amlodipine. Was supposed be on aspirin but I do not see it listed. Not on statin due to intolerance.      Relevant Medications   furosemide (LASIX) 20 MG tablet   S/P CABG x 4 (Chronic)    Given his low functional status, I do not think it is reasonable to do screening stress test evaluation post CABG.  Would only evaluate if symptoms warrant.      Coronary artery disease involving coronary bypass graft of native heart with angina pectoris ([place]) - Primary (Chronic)    Thankfully, he has not had any further anginal symptoms.  We had him on amlodipine, but that is no longer present.  He is only on low-dose ACE inhibitor.  Not on beta-blocker because of COPD.  May consider using diltiazem.  Anginal  heart rate control. Statin intolerant, and not willing to take any additional medications.      Relevant Medications   furosemide (LASIX) 20 MG tablet   Other Relevant Orders   EKG 12-Lead (Completed)   Hyperlipidemia with target LDL less than 70 (Chronic)    Statin intolerant.  Unwilling to take any medications.  Does not want to go to CVRR to discuss other medications. Thankfully, his LDL is not completely out of control.  Is only 105.      Relevant Medications   furosemide (LASIX) 20 MG tablet   Essential hypertension (Chronic)    Blood pressure is higher today than usual.  But he tells me it is usually better than this at home.  Remains on low-dose ACE inhibitor.  Had been on amlodipine, but stopped apparently because of orthostatic hypotension.  If pressures normalize, may consider starting diltiazem as opposed to amlodipine.      Relevant Medications   furosemide (LASIX) 20 MG tablet       COVID-19 Education: The signs and symptoms of COVID-19 were discussed with the patient and how to seek care for testing (follow up with PCP or arrange E-visit).   The importance of social distancing and COVID-19 vaccination was discussed today.  I spent a total of 49minutes with the patient. >  50% of the time was spent in direct patient consultation.  Additional time spent with chart review  / charting (studies, outside notes, etc): 8 Total Time: 26 min   Current medicines are reviewed at length with the patient today.  (+/- concerns) none  Notice: This dictation was prepared with Dragon dictation along with smaller phrase technology. Any transcriptional errors that result from this process are unintentional and may not be corrected upon review.  Patient Instructions / Medication Changes & Studies & Tests Ordered   Patient Instructions  Medication Instructions:  Continue current medications  *If you need a refill on your cardiac medications before your next appointment, please  call your pharmacy*   Lab Work: None ordered   Testing/Procedures: None ordered   Follow-Up: At [facility], you and your health needs are our priority.  As part of our continuing mission to provide you with exceptional heart care, we have created designated Provider Care Teams.  These Care Teams include your primary Cardiologist (physician) and Advanced Practice Providers (APPs -  Physician Assistants and Nurse Practitioners) who all work together to provide  you with the care you need, when you need it.  We recommend signing up for the patient portal called "MyChart".  Sign up information is provided on this After Visit Summary.  MyChart is used to connect with patients for Virtual Visits (Telemedicine).  Patients are able to view lab/test results, encounter notes, upcoming appointments, etc.  Non-urgent messages can be sent to your provider as well.   To learn more about what you can do with MyChart, go to [email or web address].    Your next appointment:   1 year(s)  The format for your next appointment:   In Person  Provider:   You may see John Hew, MD or one of the following Advanced Practice Providers on your designated Care Team:    Rosaria Ferries, PA-C  Jory Sims, DNP, ANP  Cadence Kathlen Mody, PA-C     Studies Ordered:   Orders Placed This Encounter  Procedures   EKG 12-Lead     [facility], M.D., M.S. Interventional Cardiologist   Pager # [phone] Phone # [phone] [address] [place], [place] [address]   Thank you for choosing Heartcare at [facility]!!

## 2020-01-31 NOTE — Patient Instructions (Addendum)
Medication Instructions:  Continue current medications  *If you need a refill on your cardiac medications before your next appointment, please call your pharmacy*   Lab Work: None ordered   Testing/Procedures: None ordered   Follow-Up: At [facility], you and your health needs are our priority.  As part of our continuing mission to provide you with exceptional heart care, we have created designated Provider Care Teams.  These Care Teams include your primary Cardiologist (physician) and Advanced Practice Providers (APPs -  Physician Assistants and Nurse Practitioners) who all work together to provide you with the care you need, when you need it.  We recommend signing up for the patient portal called "MyChart".  Sign up information is provided on this After Visit Summary.  MyChart is used to connect with patients for Virtual Visits (Telemedicine).  Patients are able to view lab/test results, encounter notes, upcoming appointments, etc.  Non-urgent messages can be sent to your provider as well.   To learn more about what you can do with MyChart, go to [email or web address].    Your next appointment:   1 year(s)  The format for your next appointment:   In Person  Provider:   You may see Glenetta Hew, MD or one of the following Advanced Practice Providers on your designated Care Team:    Rosaria Ferries, PA-C  Jory Sims, DNP, ANP  Cadence Kathlen Mody, PA-C

## 2020-02-03 ENCOUNTER — Encounter: Payer: Self-pay | Admitting: Cardiology

## 2020-02-03 NOTE — Assessment & Plan Note (Signed)
Given his low functional status, I do not think it is reasonable to do screening stress test evaluation post CABG.  Would only evaluate if symptoms warrant.

## 2020-02-03 NOTE — Assessment & Plan Note (Signed)
Statin intolerant.  Unwilling to take any medications.  Does not want to go to CV[facility] to discuss other medications. Thankfully, his LDL is not completely out of control.  Is only 105.

## 2020-02-03 NOTE — Assessment & Plan Note (Signed)
Blood pressure is higher today than usual.  But he tells me it is usually better than this at home.  Remains on low-dose ACE inhibitor.  Had been on amlodipine, but stopped apparently because of orthostatic hypotension.  If pressures normalize, may consider starting diltiazem as opposed to amlodipine.

## 2020-02-03 NOTE — Assessment & Plan Note (Signed)
Thankfully, he has not had any further anginal symptoms.  We had him on amlodipine, but that is no longer present.  He is only on low-dose ACE inhibitor.  Not on beta-blocker because of COPD.  May consider using diltiazem.  Anginal heart rate control. Statin intolerant, and not willing to take any additional medications.

## 2020-02-03 NOTE — Assessment & Plan Note (Signed)
Severe native disease from left main and other native vessels.  He still continues to smoke, but it seems like COPD is more limiting than angina.  Not on beta-blocker because of COPD. Plan: Continue very low-dose ramipril.  He is no longer on amlodipine. Was supposed be on aspirin but I do not see it listed. Not on statin due to intolerance.

## 2020-05-10 DIAGNOSIS — C44629 Squamous cell carcinoma of skin of left upper limb, including shoulder: Secondary | ICD-10-CM | POA: Diagnosis not present

## 2020-05-10 DIAGNOSIS — L309 Dermatitis, unspecified: Secondary | ICD-10-CM | POA: Diagnosis not present

## 2020-05-10 DIAGNOSIS — C44612 Basal cell carcinoma of skin of right upper limb, including shoulder: Secondary | ICD-10-CM | POA: Diagnosis not present

## 2020-05-10 DIAGNOSIS — D485 Neoplasm of uncertain behavior of skin: Secondary | ICD-10-CM | POA: Diagnosis not present

## 2020-06-07 DIAGNOSIS — C44612 Basal cell carcinoma of skin of right upper limb, including shoulder: Secondary | ICD-10-CM | POA: Diagnosis not present

## 2020-06-19 DIAGNOSIS — L905 Scar conditions and fibrosis of skin: Secondary | ICD-10-CM | POA: Diagnosis not present

## 2020-06-19 DIAGNOSIS — Z85828 Personal history of other malignant neoplasm of skin: Secondary | ICD-10-CM | POA: Diagnosis not present

## 2020-06-19 DIAGNOSIS — L570 Actinic keratosis: Secondary | ICD-10-CM | POA: Diagnosis not present

## 2020-06-19 DIAGNOSIS — L8990 Pressure ulcer of unspecified site, unspecified stage: Secondary | ICD-10-CM | POA: Diagnosis not present

## 2020-07-12 IMAGING — CT CT ANGIO CHEST
2 of 6 series · 18 of 46 positions shown · IV contrast (APPLIED)
Comparison: 08/19/2018 CT

CLINICAL DATA: 78-year-old male with shortness of breath and
elevated D-dimer. History of COPD and RIGHT LOWER lobectomy for lung
cancer.

EXAM:
CT ANGIOGRAPHY CHEST WITH CONTRAST
TECHNIQUE: Multidetector CT imaging of the chest was performed using the
standard protocol during bolus administration of intravenous
contrast. Multiplanar CT image reconstructions and MIPs were
obtained to evaluate the vascular anatomy.
CONTRAST:  100mL OMNIPAQUE IOHEXOL 350 MG/ML SOLN

[Series 5: thins · axial · 0.68mm/px · z∈[-317,-44]mm · 16 of 299 slices shown]
[im 13/299  lung]
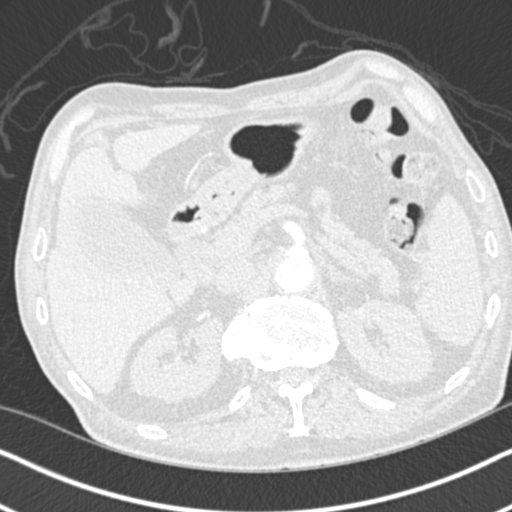
[im 39/299  soft-tissue]
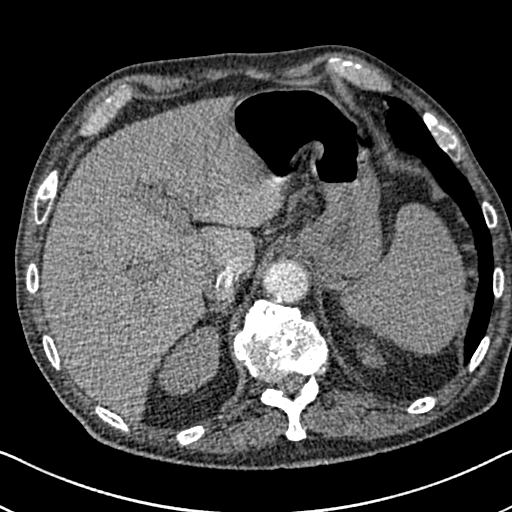
[im 52/299  lung]
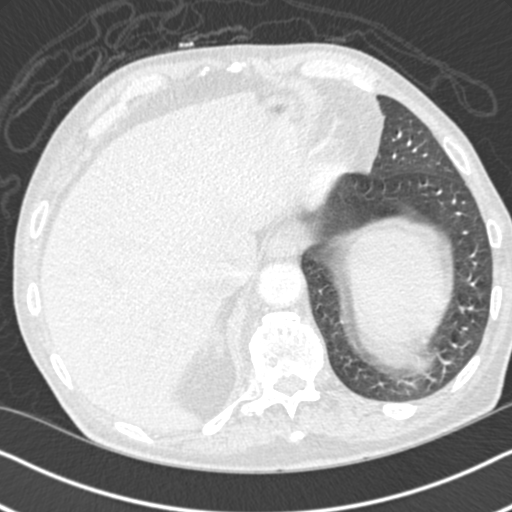
[im 65/299  soft-tissue]
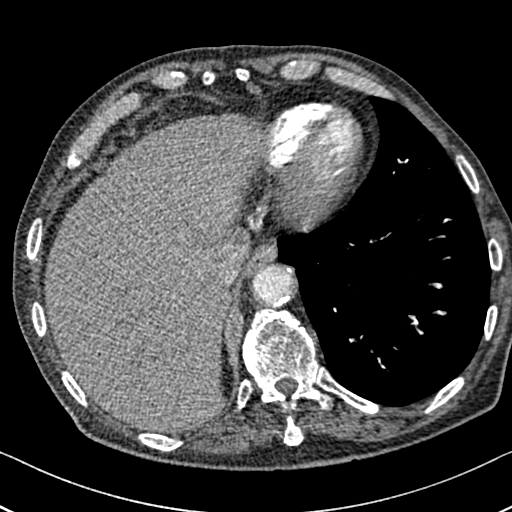
[im 91/299  lung]
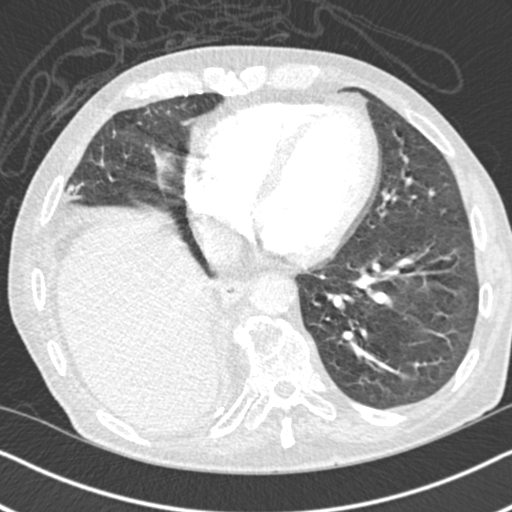
[im 104/299  soft-tissue]
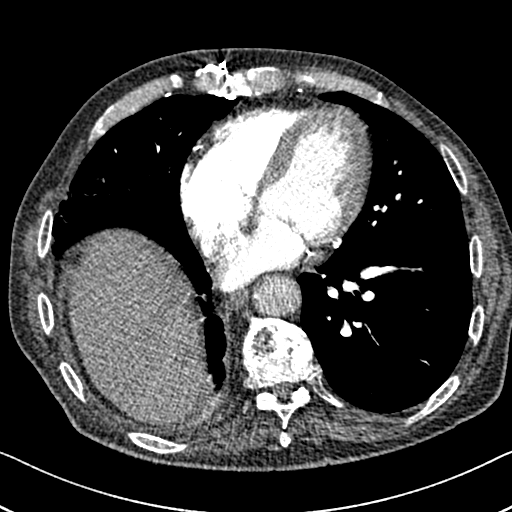
[im 117/299  lung]
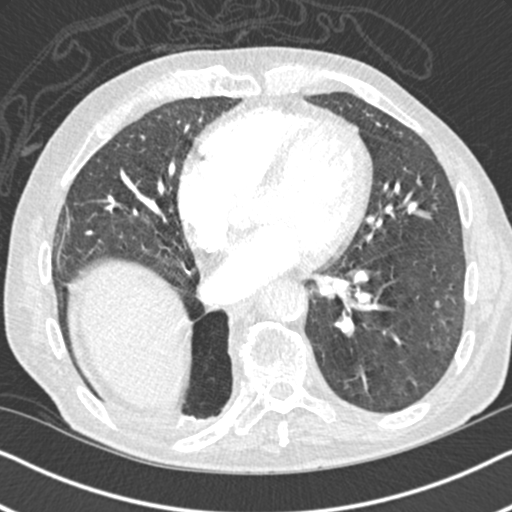
[im 143/299  soft-tissue]
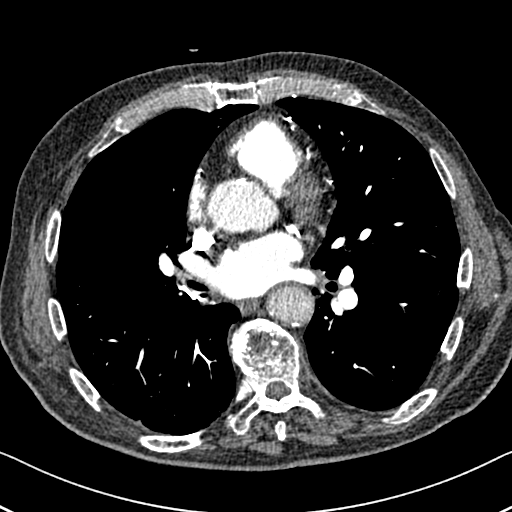
[im 156/299  lung]
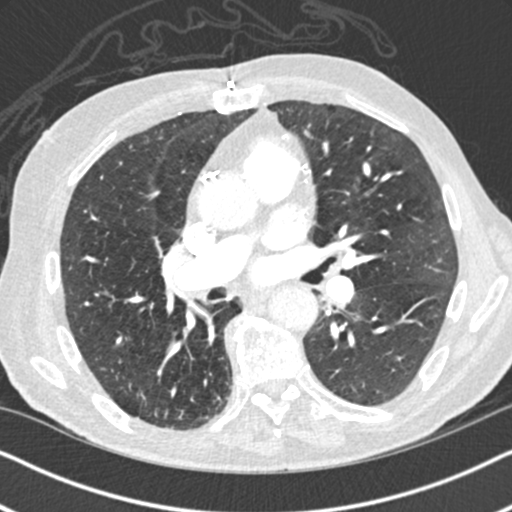
[im 182/299  soft-tissue]
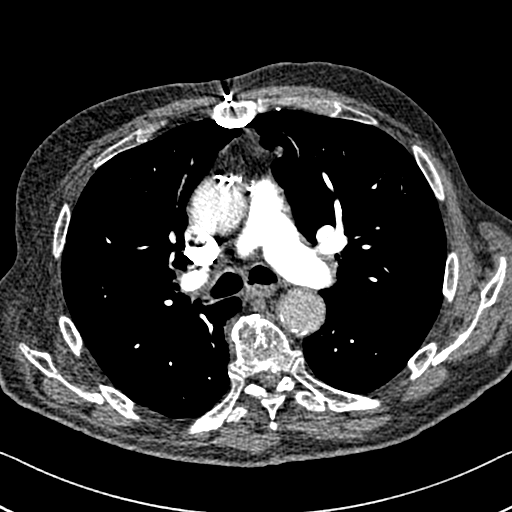
[im 195/299  lung]
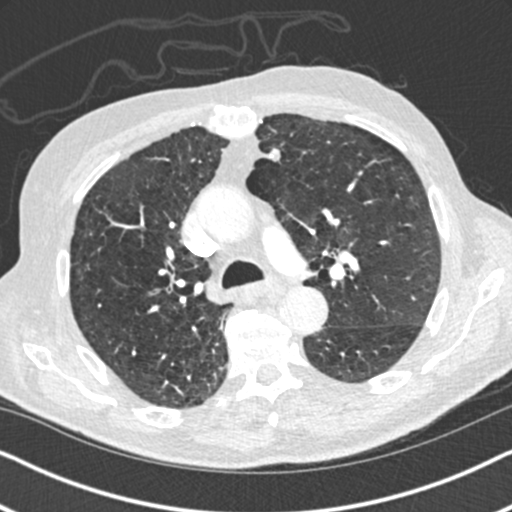
[im 208/299  soft-tissue]
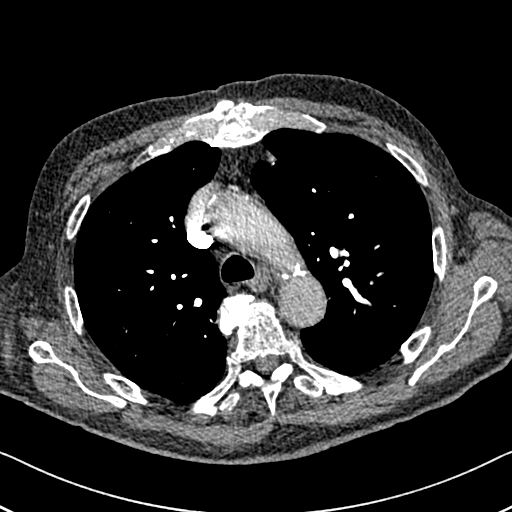
[im 234/299  lung]
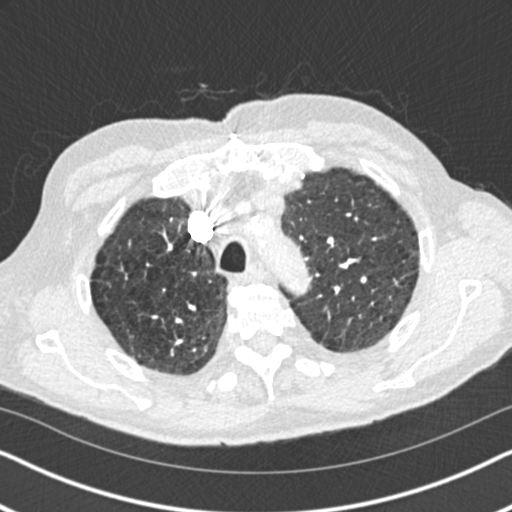
[im 247/299  soft-tissue]
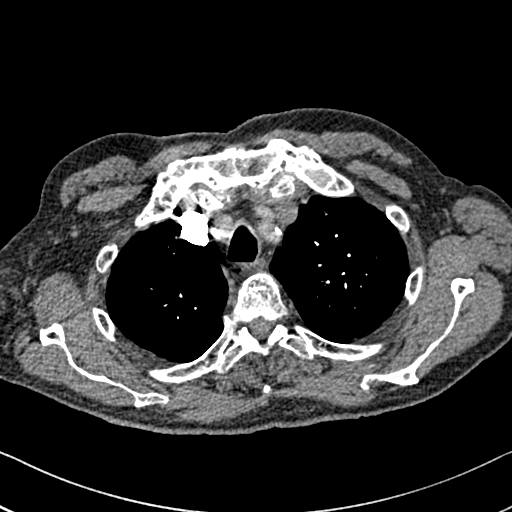
[im 260/299  lung]
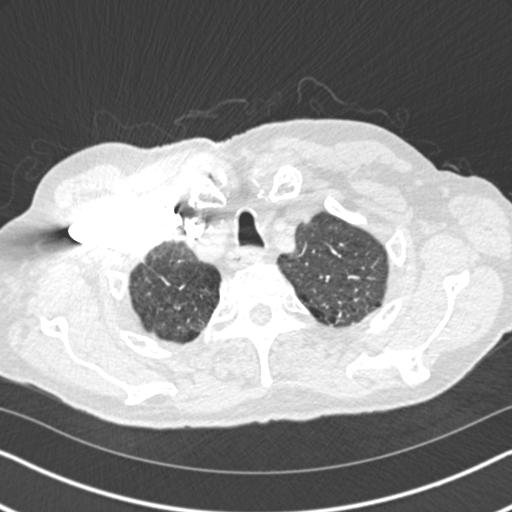
[im 286/299  soft-tissue]
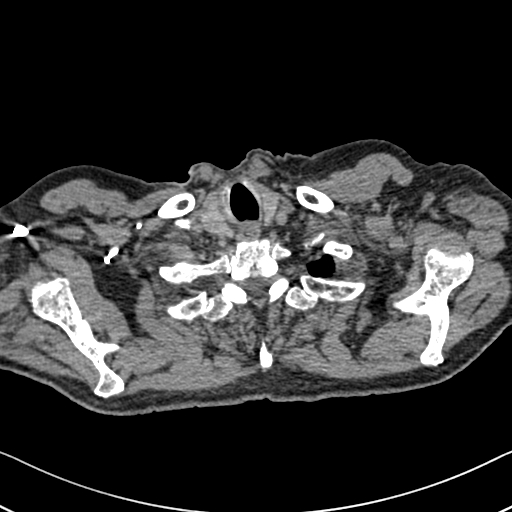

[Series 7: coronal mpr · coronal · 0.61mm/px · 2 of 98 slices shown]
[im 33/98  soft-tissue]
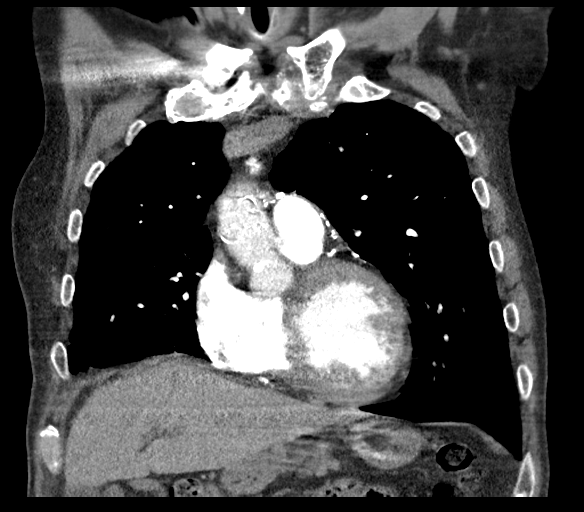
[im 65/98  soft-tissue]
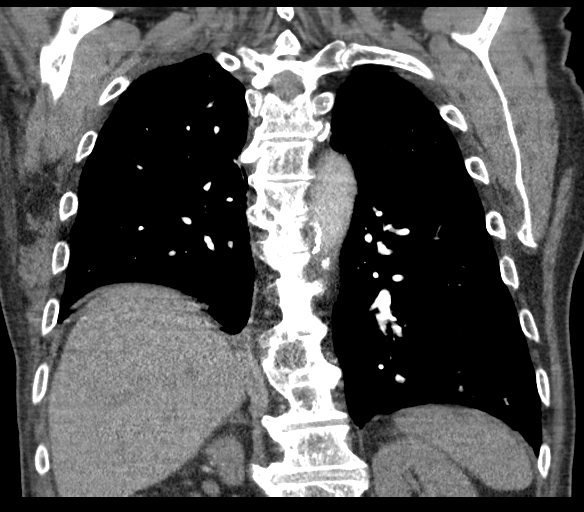

[18 of 46 positions shown; findings below may reference images not displayed]

FINDINGS: Cardiovascular: This is a technically adequate study although
respiratory motion artifact in the LOWER lungs decreases
sensitivity.

No pulmonary emboli are identified. Cardiomegaly, coronary artery
and aortic atherosclerotic calcifications and CABG changes again
noted. No thoracic aortic aneurysm or pericardial effusion.

Mediastinum/Nodes: No enlarged mediastinal, hilar, or axillary lymph
nodes. Thyroid gland, trachea, and esophagus demonstrate no
significant findings.

Lungs/Pleura: RIGHT LOWER lobectomy changes noted.

Mild centrilobular and paraseptal emphysema again noted.

A new 5 mm RIGHT middle lobe nodule (series 6: Image 77) is noted.

No other pulmonary nodule, airspace disease, consolidation, mass,
pleural effusion or pneumothorax noted.

Upper Abdomen: No acute abnormality.

Musculoskeletal: No chest wall abnormality. No acute or significant
osseous findings.

Review of the MIP images confirms the above findings.
IMPRESSION: 1. New 5 mm RIGHT middle lobe nodule. Given previous history of lung
cancer and underlying emphysema, recommend short-term follow-up in
3-6 months. Given the size of this nodule, PET CT may or may not be
helpful for further evaluation.
2. No evidence of pulmonary emboli.
3. Coronary artery disease and CABG changes
4. Aortic Atherosclerosis (XTSK5-0F4.4) and Emphysema ([id number].B).

## 2020-07-17 ENCOUNTER — Ambulatory Visit: Payer: Medicare Other | Admitting: Pulmonary Disease

## 2020-07-21 ENCOUNTER — Ambulatory Visit (HOSPITAL_COMMUNITY)
Admission: RE | Admit: 2020-07-21 | Discharge: 2020-07-21 | Disposition: A | Discharge status: Home / Self Care | Payer: Medicare Other | Source: Ambulatory Visit | Attending: Acute Care | Admitting: Acute Care

## 2020-07-21 ENCOUNTER — Other Ambulatory Visit: Payer: Self-pay

## 2020-07-21 ENCOUNTER — Encounter (HOSPITAL_COMMUNITY): Payer: Self-pay

## 2020-07-21 DIAGNOSIS — R911 Solitary pulmonary nodule: Secondary | ICD-10-CM | POA: Insufficient documentation

## 2020-07-21 DIAGNOSIS — J929 Pleural plaque without asbestos: Secondary | ICD-10-CM | POA: Diagnosis not present

## 2020-07-21 DIAGNOSIS — J439 Emphysema, unspecified: Secondary | ICD-10-CM | POA: Diagnosis not present

## 2020-07-21 DIAGNOSIS — M47814 Spondylosis without myelopathy or radiculopathy, thoracic region: Secondary | ICD-10-CM | POA: Diagnosis not present

## 2020-07-21 DIAGNOSIS — K7290 Hepatic failure, unspecified without coma: Secondary | ICD-10-CM | POA: Diagnosis not present

## 2020-07-24 ENCOUNTER — Ambulatory Visit: Payer: Medicare Other | Admitting: Acute Care

## 2020-07-26 ENCOUNTER — Telehealth: Payer: Self-pay | Admitting: Acute Care

## 2020-07-26 NOTE — Telephone Encounter (Signed)
Patient calling for CT results. 

## 2020-07-26 NOTE — Telephone Encounter (Signed)
Please call patient and let him know his CT showed:  IMPRESSION: 1. Status post right lower lobectomy, without findings of metastatic or recurrent disease. 2. The lingular nodule is no longer identified. Right middle lobe and right apical pulmonary nodules are unchanged, likely benign. 3. Aortic atherosclerosis (ICD10-I70.0) and emphysema (ICD10-J43.9). 4. Left nephrolithiasis. 5. Hepatic morphology for which cirrhosis is possible. Correlate with risk factors.  Good news, no signs of recurrent disease. Have him follow up with PCP about liver findings.  Thanks so much

## 2020-07-26 NOTE — Telephone Encounter (Signed)
Spoke with pt's wife (per DPR) and reviewed CT results. Pt's wife stated understanding. Nothing further needed at this time.

## 2020-07-31 ENCOUNTER — Telehealth: Payer: Self-pay | Admitting: Acute Care

## 2020-07-31 NOTE — Telephone Encounter (Signed)
Called Patient's wife Wells Guiles.  Wells Guiles stated Patient has OV with Judson Roch, NP 08/07/20, and she was wanting to know if OV could be changed to televisit? Wells Guiles stated they can come in to office if needed, but if it was to review CT, or follow up they would prefer televisit.  Message routed to [facility] to advise

## 2020-08-01 ENCOUNTER — Other Ambulatory Visit: Payer: Self-pay

## 2020-08-01 DIAGNOSIS — I779 Disorder of arteries and arterioles, unspecified: Secondary | ICD-10-CM

## 2020-08-02 NOTE — Telephone Encounter (Signed)
Called and spoke with pts wife and she stated that the pt is doing well and is not having any issues at this time.  OV on 1/31 has been changed to a televisit.

## 2020-08-02 NOTE — Telephone Encounter (Signed)
This is a 6 month follow up for COPD. As long as COPD is stable and patient is not having issues a televisit is fine. Thanks so much

## 2020-08-03 NOTE — Progress Notes (Signed)
These results have been called to the patient's wife. See telephone note dated 07/26/2020.  Thanks so much

## 2020-08-07 ENCOUNTER — Encounter: Payer: Self-pay | Admitting: Acute Care

## 2020-08-07 ENCOUNTER — Other Ambulatory Visit: Payer: Self-pay

## 2020-08-07 ENCOUNTER — Ambulatory Visit (INDEPENDENT_AMBULATORY_CARE_PROVIDER_SITE_OTHER): Payer: Medicare Other | Admitting: Acute Care

## 2020-08-07 DIAGNOSIS — J432 Centrilobular emphysema: Secondary | ICD-10-CM | POA: Diagnosis not present

## 2020-08-07 DIAGNOSIS — J449 Chronic obstructive pulmonary disease, unspecified: Secondary | ICD-10-CM | POA: Diagnosis not present

## 2020-08-07 DIAGNOSIS — R918 Other nonspecific abnormal finding of lung field: Secondary | ICD-10-CM

## 2020-08-07 DIAGNOSIS — J9611 Chronic respiratory failure with hypoxia: Secondary | ICD-10-CM | POA: Diagnosis not present

## 2020-08-07 DIAGNOSIS — Z720 Tobacco use: Secondary | ICD-10-CM

## 2020-08-07 DIAGNOSIS — F17210 Nicotine dependence, cigarettes, uncomplicated: Secondary | ICD-10-CM

## 2020-08-07 NOTE — Patient Instructions (Addendum)
It was good to speak with you today.  - Follow up CT Chest 07/2021 - continue Symbicort ( 2 puffs twice daily), and Spiriva ( 1 puff once daily) without fail. - Rinse mouth after use - Stop Advair, as this is the same drug class as Symbicort. - Rinse mouth after use - Call if he does not tolerate this change and revert to Advair and Spiriva.  - Continue using albuterol inhaler and nebs as needed for breakthrough shortness of breath or wheezing.  Note your daily symptoms > remember "red flags" for COPD:  Increase in cough, increase in sputum production, increase in shortness of breath or activity intolerance. If you notice these symptoms, please call to be seen.   Wear oxygen at 3 L  as you have been doing at bedtime, and as needed during the day.  Saturation Goals are 88-92%. Seek emergency care if sats drop consistently below 88% and you are unable to improve sats with short term increase in oxygen.  Follow up with Dr. Halford Chessman or Judson Roch NP in 2 months or before as needed.  You need to completely quit smoking.  Call 1-800-quit now for access to free nicotine patches,  Gum and mints.  Nicotine replacement therapy can help you quit smoking.  Please contact office for sooner follow up if symptoms do not improve or worsen or seek emergency care

## 2020-08-07 NOTE — Progress Notes (Signed)
Virtual Visit via Telephone Note  I connected with John Ball on 08/07/20 at 11:00 AM EST by telephone and verified that I am speaking with the correct person using two identifiers.  Location: Patient: At home Provider: [address], [place], [place], [address]   I discussed the limitations, risks, security and privacy concerns of performing an evaluation and management service by telephone and the availability of in person appointments. I also discussed with the patient that there may be a patient responsible charge related to this service. The patient expressed understanding and agreed to proceed.   Synopsis 80 year old current every day smoker ( 90 pack year hsitory) with COPD, history of emphysema and chronic bronchitis, and chronic respiratory failure with hypoxia. He has hx of NSCLC s/p Rt lower lobectomy 2012.He requires home oxygen at 2-3 L [place]. He is followed by Dr. Halford Chessman. Maintenance : Advair and Spiriva and has been using Symbicort prn Rescue: Albuterol Inhaler and Nebulizer solution Last oxygen qualification 04/2019  History of Present Illness: Pt. Presents for follow up of his COPD and surveillance CT of his lungs.He has recently ( 07/2020) had his surveillance CT Chest with no signs of recurrent disease . Previously noted lingular nodule is no longer identified.He is compliant with his Advair and Spiriva.He is also taking Symbicort prn. We discussed that he is taking 2 medications in the same drug class. ( Advair and Symbicort). We will do a therapeutic trial of just Symbicort and Spiriva as his wife states the Symbicort seems to work better than the Advair.  He uses his rescue inhaler twice daily. He is compliant with his lasix as needed per cards.  He does continue to smoke. He states he is down to 1/2 pack per day. He has not been wheezing. He has been coughing very little. When he does cough he does not have many secretions. Secretions are white to clear.  Per his  wife, he is wearing his oxygen at night, he wears his oxygen during the day when he needs it.We reviewed saturation goals.  He states he has no worsening shortness of breath from his baseline. We have reviewed the signs and symptoms of a flare, and he is aware of these, and knows to call to be seen. He has not been venturing outside, he does not do much exertional activity. He does have a scooter he uses to go out of the house. He denies fever, chest pain, orthopnea or hemoptysis. No lower extremity edema.   Smoking cessation counseling  I have spent 3 minutes counseling patient on smoking cessation this visit.We have reviewed the risks of continued smoking on his current health situation.  Patient verbalizes understanding of their  choice to continue smoking and the negative health consequences including worsening of COPD, risk of lung cancer , stroke and heart disease.     Observations/Objective: Echo 09/26/17 >> EF 50 to 55%, grade 1 DD, PAS 36 mmHg  Spirometry 03/05/16 >> FEV1 1.21 (36%), FEV1% 54% ABG 03/07/16 >> pH 7.40, PaCO2 42.6, PaO2 59.3  CT chest 04/20/15 >> atherosclerosis, mild centrilobular/paraseptal emphysema, s/p RLL ectomy CT angio chest 09/12/17 >> thickened Rt diaphragm, s/p RLLectomy, tree in bud CT chest 08/19/18 >> centrilobular/paraseptal emphysema, volume loss Rt hemithorax, decreased LAN CT chest 07/28/19 >> stable 4 mm RML nodule, new 3 mm nodule in lingula CT Chest 07/21/2020>> Status post right lower lobectomy, without findings of metastatic or recurrent disease. The lingular nodule is no longer identified. Right middle  lobe and right apical pulmonary nodules are unchanged, likely benign.Hepatic morphology for which cirrhosis is possible. Correlate with risk factors  Assessment and Plan: COPD with Emphysema and chronic bronchitis Continues to smoke daily Stable interval Plan - continue Symbicort ( 2 puffs twice daily), and Spiriva ( 1 puff once daily) without fail. -  Rinse mouth after use - Stop Advair, as this is the same drug class as Symbicort. - Call  if he does not tolerate this change and revert to Advair and Spiriva.  - Continue using albuterol inhaler and nebs as needed for breakthrough shortness of breath or wheezing.  - Note your daily symptoms > remember "red flags" for COPD:  Increase in cough, increase in sputum production, increase in shortness of breath or activity intolerance. If you notice these symptoms, please call to be seen.    Chronic respiratory failure with hypoxia. -  PaO2 < 60 and chronic leg edema in setting of COPD Plan - 3 liters oxygen at bedtime and as needed during the day.   Lung Nodules Former hx of NSCLC s/p Rt lower lobectomy 2012. Plan Annual CT Chest Surveillance as he is high risk for recurrence Smoking cessation counseling x 3 minutes  Tobacco Abuse Continues to smoke daily Plan I have spent 3 minutes counseling patient on smoking cessation this visit.We have reviewed the risks of continued smoking on his current health situation. Patient verbalizes understanding of their  choice to continue smoking and the negative health consequences including worsening of COPD, risk of lung cancer , stroke and heart disease..    Follow up  Follow up with Dr. Halford Chessman or Judson Roch NP in 2  Months to make sure you are tolerating the medication change or before as needed.      I discussed the assessment and treatment plan with the patient. The patient was provided an opportunity to ask questions and all were answered. The patient agreed with the plan and demonstrated an understanding of the instructions.   The patient was advised to call back or seek an in-person evaluation if the symptoms worsen or if the condition fails to improve as anticipated.  I provided 23  minutes of non-face-to-face time during this encounter.   Magdalen Spatz, NP 08/07/2020

## 2020-08-07 NOTE — Progress Notes (Signed)
Reviewed and agree with assessment/plan.   Chesley Mires, MD [facility] Pulmonary/Critical Care 08/07/2020, 12:15 PM Pager:  [phone]

## 2020-08-23 ENCOUNTER — Ambulatory Visit (INDEPENDENT_AMBULATORY_CARE_PROVIDER_SITE_OTHER)
Admission: RE | Admit: 2020-08-23 | Discharge: 2020-08-23 | Disposition: A | Discharge status: Home / Self Care | Payer: Medicare Other | Source: Ambulatory Visit | Attending: Vascular Surgery | Admitting: Vascular Surgery

## 2020-08-23 ENCOUNTER — Other Ambulatory Visit: Payer: Self-pay

## 2020-08-23 ENCOUNTER — Ambulatory Visit (INDEPENDENT_AMBULATORY_CARE_PROVIDER_SITE_OTHER): Payer: Medicare Other | Admitting: Vascular Surgery

## 2020-08-23 ENCOUNTER — Ambulatory Visit (HOSPITAL_COMMUNITY)
Admission: RE | Admit: 2020-08-23 | Discharge: 2020-08-23 | Disposition: A | Discharge status: Home / Self Care | Payer: Medicare Other | Source: Ambulatory Visit | Attending: Vascular Surgery | Admitting: Vascular Surgery

## 2020-08-23 ENCOUNTER — Encounter: Payer: Self-pay | Admitting: Vascular Surgery

## 2020-08-23 VITALS — BP 148/86 | HR 68 | Temp 98.0°F | Resp 24 | Ht 72.0 in | Wt 188.0 lb

## 2020-08-23 DIAGNOSIS — I779 Disorder of arteries and arterioles, unspecified: Secondary | ICD-10-CM | POA: Diagnosis not present

## 2020-08-23 NOTE — Progress Notes (Signed)
REASON FOR VISIT:   Follow-up of peripheral vascular disease  MEDICAL ISSUES:   PERIPHERAL VASCULAR DISEASE: Given that the patient has multiphasic flow in both lower extremities I believe his bypass grafts are patent.  His activity is very much limited by his pulmonary status.  He has severe COPD on home O2.  He has a small wound on the lateral aspect of his right ankle which is quite superficial as documented below.  He will keep a close eye on this and is followed by a dermatologist.  If this were to progress we would have to consider further work-up.  I will see him back in 1 year with ABIs and graft duplex scans.  I have discussed with him the importance of tobacco cessation.  He will call sooner if he has problems.    HPI:   John Ball is a pleasant 80 y.o. male who is a patient of Dr. Luther Parody.  He has had several previous procedures as documented below:  01/10/2014: Right femoral posterior tibial bypass 02/10/2018 Left common iliac artery stent 02/11/2018: Left femoral to below-knee popliteal artery bypass  The patient was last seen by Dr. Donnetta Hutching on 02/09/2019.  That time he was not having a lot of leg pain although his activity was very much limited by his severe COPD.  He apparently had some ulcers on his legs but these had all healed.  He had multiphasic waveforms on his noninvasive studies and was set up for a 1 year follow-up visit.  He has no specific complaints.  He denies claudication although his activity is very much limited by his pulmonary status.  He denies any history of rest pain.  He has a small wound on his right lateral malleolus as documented below.  He thinks this might have been from an insect bite.  He is followed by dermatologist.  According to the records he is allergic to aspirin and statins.   Past Medical History:  Diagnosis Date  . Anxiety   . Arthritis   . Complication of anesthesia    hx prolonged post op oxygen dependent/  hx hallucinations for 2  day post op lobectomy 2012  . COPD, severe ([place])   . Depression   . Essential hypertension   . Hearing loss of both ears    does wear his hearing aids  . History of arterial bypass of lower extremity    RIGHT FEM-POP  . History of CHF (congestive heart failure)   . History of DVT of lower extremity   . History of panic attacks   . Hyperlipidemia with target LDL less than 70   . Left main coronary artery disease 2004   a) Severe LM CAD 2004 -->s/p  cabg x4 (LIMA-LAD, free RIMA-RI, SVG-OM 2, SVG-RPDA); b) 06/2014: Abnormal Myoview --> c)cardiac Cath Jan 2016: pLM 70%, pLAD 40-50%- patent LIMA-LAD.  RI -competitive flow noted from RIMA-RI graft, circumflex, normal caliber with moderate OM1.  OM 2 occluded.  SVG-OM 2 patent. RCA CTO after large RV M, faint R-R and LAD septal-PDA collaterals =--> new  . Neuropathy, peripheral   . 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 Nocturia   . OSA (obstructive sleep apnea)    uses O2  via [place]  --  intolerate to CPAP  . Peripheral arterial occlusive disease ([place])    2011   a) Gore-Tex graft right AK popA-BK popA --> b) 2012: thrombosed graft --> thrombectomy with dacron  patch = 1 V runnof via Peroneal; c) 01/2014: R femoropopliteal bypass with left femoral vein;; followed by dr Early--  per last dopplers 06-28-2014 no change right graft,  50-74% stenosis common and left mid superficial femoral artery  . PTSD (post-traumatic stress disorder)    anxiety attack's---  [place] V[facility]  . Recurrent productive cough    SMOKER'S COUGH  . 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  Medical management  . Smoker unmotivated to quit    Reportedly he came close to having a nervous breakdown when he last tried to quit  . 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 Wears glasses     Family History  Problem Relation Age of Onset  . Other Mother 55       Abdominal Aortic Aneurysm   . Stroke Mother   . Alzheimer's disease Father 16  . Stroke Father     SOCIAL HISTORY: Social History   Tobacco Use  . Smoking status: Current Every Day Smoker    Packs/day: 1.00    Years: 60.00    Pack years: 60.00    Types: Cigarettes    Start date: 09/14/1957  . Smokeless tobacco: Never Used  . Tobacco comment: 1/2 pk per day 01/14/19  Substance Use Topics  . Alcohol use: No    Allergies  Allergen Reactions  . Aspirin Other (See Comments)    Avoids due to severe bruises  . Crestor [Rosuvastatin Calcium] Other (See Comments)    myalgia  . Lipitor [Atorvastatin Calcium] Other (See Comments)    myalgia  . Zetia [Ezetimibe] Other (See Comments)    myalgia  . Zocor [Simvastatin - High Dose] Other (See Comments)    myalgia  . Aripiprazole Other (See Comments)    Altered mental status  . Effexor [Venlafaxine Hydrochloride] Itching  . Morphine And Related Itching    Current Outpatient Medications  Medication Sig Dispense Refill  . acetaminophen (TYLENOL) 500 MG tablet Take 1,000 mg by mouth every 6 (six) hours as needed for moderate pain or headache.     . ADVAIR DISKUS 500-50 MCG/DOSE AEPB Inhale 1 puff into the lungs 2 (two) times daily.     Marland Kitchen albuterol (PROVENTIL HFA;VENTOLIN HFA) 108 (90 BASE) MCG/ACT inhaler Inhale 1 puff into the lungs every 6 (six) hours as needed for wheezing or shortness of breath.    Marland Kitchen albuterol (PROVENTIL) (2.5 MG/3ML) 0.083% nebulizer solution     . Carbomer Gel Base  (HYDROGEL) GEL 1 g by Does not apply route daily. 100 g 0  . Cholecalciferol (VITAMIN D3) 1000 UNITS CAPS Take 1,000 Units by mouth every morning.     . CYMBALTA 60 MG capsule Take 60 mg by mouth daily.    . furosemide (LASIX) 20 MG tablet Take 20 mg by mouth as needed.    . gabapentin (NEURONTIN) 100 MG  capsule Take 100 mg by mouth daily.    Marland Kitchen lidocaine (LIDODERM) 5 % Place 3 patches onto the skin every 12 (twelve) hours. Remove & Discard patch within 12 hours or as directed by MD 90 patch 1  . mupirocin ointment (BACTROBAN) 2 % SMARTSIG:1 Application Topical 2-3 Times Daily    . nitroGLYCERIN (NITROSTAT) 0.4 MG SL tablet Place 1 tablet (0.4 mg total) under the tongue every 5 (five) minutes as needed for chest pain. 25 tablet 6  . OXYGEN Inhale 2 L into the lungs See admin instructions. Uses 2L at bedtime and if needed throughout the day -- intolerant to CPAP    . prazosin (MINIPRESS) 1 MG capsule Take 1 mg by mouth at bedtime.    . pregabalin (LYRICA) 75 MG capsule Take 1 capsule (75 mg total) by mouth 3 (three) times daily. 90 capsule 5  . QUEtiapine (SEROQUEL XR) 300 MG 24 hr tablet Take 150 mg by mouth at bedtime.    . ramipril (ALTACE) 2.5 MG capsule Take 2.5 mg by mouth every morning.     . SYMBICORT 160-4.5 MCG/ACT inhaler Inhale 1 puff into the lungs as needed.    . tiotropium (SPIRIVA) 18 MCG inhalation capsule Place 18 mcg into inhaler and inhale daily.    . traZODone (DESYREL) 150 MG tablet Take 0.5 tablets (75 mg total) by mouth at bedtime as needed for sleep. (Patient taking differently: Take 150 mg by mouth at bedtime.) 10 tablet 0  . vitamin B-12 (CYANOCOBALAMIN) 500 MCG tablet Take 1 tablet by mouth daily.    Marland Kitchen zolpidem (AMBIEN CR) 12.5 MG CR tablet Take 12.5 mg by mouth at bedtime.     No current facility-administered medications for this visit.    REVIEW OF SYSTEMS:  [X]  denotes positive finding, [ ]  denotes negative finding Cardiac  Comments:  Chest pain or chest  pressure:    Shortness of breath upon exertion:    Short of breath when lying flat:    Irregular heart rhythm:        Vascular    Pain in calf, thigh, or hip brought on by ambulation:    Pain in feet at night that wakes you up from your sleep:     Blood clot in your veins:    Leg swelling:         Pulmonary    Oxygen at home:    Productive cough:     Wheezing:         Neurologic    Sudden weakness in arms or legs:     Sudden numbness in arms or legs:     Sudden onset of difficulty speaking or slurred speech:    Temporary loss of vision in one eye:     Problems with dizziness:         Gastrointestinal    Blood in stool:     Vomited blood:         Genitourinary    Burning when urinating:     Blood in urine:        Psychiatric    Major depression:         Hematologic    Bleeding problems:    Problems with blood clotting too easily:        Skin    Rashes or ulcers:        Constitutional    Fever or chills:     PHYSICAL EXAM:   Vitals:   08/23/20 1109  BP: Marland Kitchen)  148/86  Pulse: 68  Resp: (!) 24  Temp: 98 F (36.7 C)  SpO2: 91%  Weight: 85.3 kg  Height: 6' (1.829 m)   GENERAL: The patient is a well-nourished male, in no acute distress. The vital signs are documented above. CARDIAC: There is a regular rate and rhythm.  VASCULAR: I do not detect carotid bruits. He has palpable femoral pulses. I cannot palpate pedal pulses however both feet are warm and well perfused. PULMONARY: There is good air exchange bilaterally without wheezing or rales. ABDOMEN: Soft and non-tender with normal pitched bowel sounds.  MUSCULOSKELETAL: There are no major deformities or cyanosis. NEUROLOGIC: No focal weakness or paresthesias are detected. SKIN: He has a small wound on his lateral malleolus on the right lower extremity as shown below.    PSYCHIATRIC: The patient has a normal affect.  DATA:    ARTERIAL DOPPLER STUDY: I have independently interpreted his arterial Doppler  study today.  On the right side he has a biphasic dorsalis pedis and posterior tibial signal.  ABI is 81%.  Toe pressure is 0.  On the left side he has a triphasic dorsalis pedis and posterior tibial signal.  ABIs 94%.  Toe pressure is 40.   Deitra Mayo [facility] [phone]

## 2020-09-28 ENCOUNTER — Other Ambulatory Visit: Payer: Self-pay

## 2020-09-28 ENCOUNTER — Encounter: Payer: Self-pay | Admitting: Pulmonary Disease

## 2020-09-28 ENCOUNTER — Ambulatory Visit (INDEPENDENT_AMBULATORY_CARE_PROVIDER_SITE_OTHER): Payer: Medicare Other | Admitting: Pulmonary Disease

## 2020-09-28 DIAGNOSIS — J9611 Chronic respiratory failure with hypoxia: Secondary | ICD-10-CM

## 2020-09-28 DIAGNOSIS — J432 Centrilobular emphysema: Secondary | ICD-10-CM | POA: Diagnosis not present

## 2020-09-28 DIAGNOSIS — Z720 Tobacco use: Secondary | ICD-10-CM | POA: Diagnosis not present

## 2020-09-28 DIAGNOSIS — J449 Chronic obstructive pulmonary disease, unspecified: Secondary | ICD-10-CM

## 2020-09-28 NOTE — Progress Notes (Signed)
John Ball, John Ball, John Ball  No chief complaint on file.   Constitutional:  There were no vitals taken for this visit.  Past Medical History:  CAD s/p CABG, PTSD, PAD, OSA intolerant of CPAP, Neuropathy, HLD, DVT, diastolic CHF, HTN, Depression, OA, Anxiety  Past Surgical History:  He  has a past surgical history that includes Femoral-tibial Bypass Graft (Right, 01/10/2014); left heart catheterization with coronary/graft angiogram (N/A, 07/12/2014); Coronary artery bypass graft (07/27/2002); Cardiovascular stress test (last one  06-17-2014   dr  harding); Tonsillectomy; Femoral artery - popliteal artery bypass graft (Right, 10-24-2008   dr early); RIGHT ABOVE KNEE POPLITEAL GRAFT TO BELOW KNEE POPLITEAL ARTERY BYPASS WITH REVERSE SAPHENOUS VEIN (03-02-2010); THROMBECTOMY OF RIGHT FEMORAL TO ABOVE KNEE POPLITEAL GORETEX GRAFT ANGIOPLASTY OF GORETEX John SAPHENOUS VEIN JUNCTION John ABOVE KNEE POPLITEAL ARTERY  (10-06-2010   DR EARLY); Video assisted thoracoscopy (vats)/wedge resection (Right, 05-01-2011    FORSYTH); Cardiac catheterization (07-13-2002); LEFT ANKLE SURGERY (1990's); Hydrocele surgery (Left); Eye surgery (Left, Nov 2013); Colonoscopy (2008    WNL); Transurethral resection of bladder tumor (N/A, 02/21/2015); Cystoscopy w/ ureteral stent placement (N/A, 02/21/2015); NM MYOVIEW LTD (11/'15; 3/'19, 5/'20); Lower Extremity Angiography (Bilateral, 11/12/2017); ABDOMINAL AORTOGRAM (N/A, 11/12/2017); Endarterectomy femoral (Left, 11/17/2017); Wound exploration (Left, 11/28/2017); Application if wound vac (Left, 12/03/2017); ABDOMINAL AORTOGRAM W/LOWER EXTREMITY (Left, 02/10/2018); PERIPHERAL VASCULAR INTERVENTION (Left, 02/10/2018); Femoral-popliteal Bypass Graft (Left, 02/11/2018); John transthoracic echocardiogram (09/2017).  Brief Summary:  John Ball is a 80 y.o. male smoker with COPD with emphysema John chronic bronchitis, John chronic respiratory failure with hypoxia. He has hx  of NSCLC s/p Rt lower lobectomy.      Subjective:  Virtual Visit via Telephone Note  I connected with John Ball on 09/28/20 at 10:45 AM EDT by telephone John verified that I am speaking with the correct person using two identifiers.  Location: Patient: home Provider: medical office   I discussed the limitations, risks, security John privacy concerns of performing an evaluation John management service by telephone John the availability of in person appointments. I also discussed with the patient that there may be a patient responsible charge related to this service. The patient expressed understanding John agreed to proceed.  His wife was on the phone with him.  He is doing okay.  Not having much cough.  Using 4 liters O2.  Inhaler working well.  Down to 1/4 ppd.  Physical Exam:   Deferred.  Ball testing:   Spirometry 03/05/16 >> FEV1 1.21 (36%), FEV1% 54%  ABG 03/07/16 >> pH 7.40, PaCO2 42.6, PaO2 59.3  Chest Imaging:   CT chest 04/20/15 >> atherosclerosis, mild centrilobular/paraseptal emphysema, s/p RLL ectomy  CT angio chest 09/12/17 >> thickened Rt diaphragm, s/p RLLectomy, tree in bud  CT chest 08/19/18 >> centrilobular/paraseptal emphysema, volume loss Rt hemithorax, decreased LAN  CT chest 07/28/19 >> stable 4 mm RML nodule, new 3 mm nodule in lingula  CT chest 07/21/20 >> stable  Cardiac Tests:   Echo 09/26/17 >> EF 50 to 55%, grade 1 DD, PAS 36 mmHg  Social History:  He  reports that he has been smoking cigarettes. He started smoking about 63 years ago. He has a 60.00 pack-year smoking history. He has never used smokeless tobacco. He reports that he does not drink alcohol John does not use drugs.  Family History:  His family history includes Alzheimer's disease (age of onset: 27) in his father; Other (age of onset: 67) in his mother; Stroke in  his father John mother.     Assessment/Plan:   COPD with emphysema John chronic bronchitis. - continue spiriva,  advair, prn albuterol  Chronic respiratory failure with hypoxia. - he has PaO2 < 60 John chronic leg edema in setting of COPD - continue 4 liters 24/7  Lung nodules. - he will need f/u CT chest in January 2023  Tobacco abuse. - encouraged him to keep up with smoking cessation efforts as he gradually quits  Time Spent Involved in Patient Ball on Day of Examination:   I discussed the assessment John treatment plan with the patient. The patient was provided an opportunity to ask questions John all were answered. The patient agreed with the plan John demonstrated an understanding of the instructions.   The patient was advised to call back or seek an in-person evaluation if the symptoms worsen or if the condition fails to improve as anticipated.  I provided 14 minutes of non-face-to-face time during this encounter.   Follow up:  Patient Instructions  Follow up in 6 months   Medication List:   Allergies as of 09/28/2020      Reactions   Aspirin Other (See Comments)   Avoids due to severe bruises   Crestor [rosuvastatin Calcium] Other (See Comments)   myalgia   Lipitor [atorvastatin Calcium] Other (See Comments)   myalgia   Zetia [ezetimibe] Other (See Comments)   myalgia   Zocor [simvastatin - High Dose] Other (See Comments)   myalgia   Aripiprazole Other (See Comments)   Altered mental status   Effexor [venlafaxine Hydrochloride] Itching   Morphine John Related Itching      Medication List       Accurate as of September 28, 2020 11:17 AM. If you have any questions, ask your nurse or doctor.        acetaminophen 500 MG tablet Commonly known as: TYLENOL Take 1,000 mg by mouth every 6 (six) hours as needed for moderate pain or headache.   Advair Diskus 500-50 MCG/DOSE Aepb Generic drug: Fluticasone-Salmeterol Inhale 1 puff into the lungs 2 (two) times daily.   albuterol 108 (90 Base) MCG/ACT inhaler Commonly known as: VENTOLIN HFA Inhale 1 puff into the lungs every 6  (six) hours as needed for wheezing or shortness of breath.   albuterol (2.5 MG/3ML) 0.083% nebulizer solution Commonly known as: PROVENTIL   Cymbalta 60 MG capsule Generic drug: DULoxetine Take 60 mg by mouth daily.   furosemide 20 MG tablet Commonly known as: LASIX Take 20 mg by mouth as needed.   gabapentin 100 MG capsule Commonly known as: NEURONTIN Take 100 mg by mouth daily.   Hydrogel Gel 1 g by Does not apply route daily.   lidocaine 5 % Commonly known as: Lidoderm Place 3 patches onto the skin every 12 (twelve) hours. Remove & Discard patch within 12 hours or as directed by MD   mupirocin ointment 2 % Commonly known as: BACTROBAN SMARTSIG:1 Application Topical 2-3 Times Daily   nitroGLYCERIN 0.4 MG SL tablet Commonly known as: NITROSTAT Place 1 tablet (0.4 mg total) under the tongue every 5 (five) minutes as needed for chest pain.   OXYGEN Inhale 2 L into the lungs See admin instructions. Uses 2L at bedtime John if needed throughout the day -- intolerant to CPAP   prazosin 1 MG capsule Commonly known as: MINIPRESS Take 1 mg by mouth at bedtime.   pregabalin 75 MG capsule Commonly known as: Lyrica Take 1 capsule (75 mg total) by mouth 3 (  three) times daily.   QUEtiapine 300 MG 24 hr tablet Commonly known as: SEROQUEL XR Take 150 mg by mouth at bedtime.   ramipril 2.5 MG capsule Commonly known as: ALTACE Take 2.5 mg by mouth every morning.   Symbicort 160-4.5 MCG/ACT inhaler Generic drug: budesonide-formoterol Inhale 1 puff into the lungs as needed.   tiotropium 18 MCG inhalation capsule Commonly known as: SPIRIVA Place 18 mcg into inhaler John inhale daily.   traZODone 150 MG tablet Commonly known as: DESYREL Take 0.5 tablets (75 mg total) by mouth at bedtime as needed for sleep. What changed:   how much to take  when to take this   vitamin B-12 500 MCG tablet Commonly known as: CYANOCOBALAMIN Take 1 tablet by mouth daily.   Vitamin D3 25  MCG (1000 UT) Caps Take 1,000 Units by mouth every morning.   zolpidem 12.5 MG CR tablet Commonly known as: AMBIEN CR Take 12.5 mg by mouth at bedtime.       Signature:  Chesley Mires, MD [place] Pager - [phone] 09/28/2020, 11:17 AM

## 2020-09-28 NOTE — Patient Instructions (Signed)
Follow up in 6 months 

## 2021-01-04 DIAGNOSIS — Z20822 Contact with and (suspected) exposure to covid-19: Secondary | ICD-10-CM | POA: Diagnosis not present

## 2021-01-05 DIAGNOSIS — Z20822 Contact with and (suspected) exposure to covid-19: Secondary | ICD-10-CM | POA: Diagnosis not present

## 2021-02-05 DIAGNOSIS — Z20822 Contact with and (suspected) exposure to covid-19: Secondary | ICD-10-CM | POA: Diagnosis not present

## 2021-03-01 ENCOUNTER — Other Ambulatory Visit: Payer: Self-pay

## 2021-03-01 ENCOUNTER — Ambulatory Visit (INDEPENDENT_AMBULATORY_CARE_PROVIDER_SITE_OTHER): Payer: Medicare Other | Admitting: Cardiology

## 2021-03-01 ENCOUNTER — Encounter: Payer: Self-pay | Admitting: Cardiology

## 2021-03-01 VITALS — BP 143/67 | HR 85 | Ht 72.0 in | Wt 173.0 lb

## 2021-03-01 DIAGNOSIS — I10 Essential (primary) hypertension: Secondary | ICD-10-CM | POA: Diagnosis not present

## 2021-03-01 DIAGNOSIS — Z951 Presence of aortocoronary bypass graft: Secondary | ICD-10-CM

## 2021-03-01 DIAGNOSIS — J9621 Acute and chronic respiratory failure with hypoxia: Secondary | ICD-10-CM | POA: Diagnosis not present

## 2021-03-01 DIAGNOSIS — I2510 Atherosclerotic heart disease of native coronary artery without angina pectoris: Secondary | ICD-10-CM

## 2021-03-01 DIAGNOSIS — E785 Hyperlipidemia, unspecified: Secondary | ICD-10-CM

## 2021-03-01 DIAGNOSIS — I5032 Chronic diastolic (congestive) heart failure: Secondary | ICD-10-CM | POA: Diagnosis not present

## 2021-03-01 DIAGNOSIS — I779 Disorder of arteries and arterioles, unspecified: Secondary | ICD-10-CM | POA: Diagnosis not present

## 2021-03-01 NOTE — Patient Instructions (Signed)
Medication Instructions:   NO CHANGES *If you need a refill on your cardiac medications before your next appointment, please call your pharmacy*   Lab Work: NOT NEEDED   Testing/Procedures: NOT NEEDED   Follow-Up: At [facility], you and your health needs are our priority.  As part of our continuing mission to provide you with exceptional heart care, we have created designated Provider Care Teams.  These Care Teams include your primary Cardiologist (physician) and Advanced Practice Providers (APPs -  Physician Assistants and Nurse Practitioners) who all work together to provide you with the care you need, when you need it.     Your next appointment:   12 month(s)  The format for your next appointment:   In Person  Provider:   Glenetta Hew, MD

## 2021-03-01 NOTE — Progress Notes (Signed)
Primary Care Provider: Lorrene Reid, PA-C Cardiologist: Glenetta Hew, MD Electrophysiologist: None Vascular Surgeon: Dr. Gae Gallop  Clinic Note: Chief Complaint  Patient presents with   Follow-up    Worsening shortness of breath issues-on more oxygen.   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 Thankfully, no further angina.  Mostly limited by COPD related dyspnea.  He continues to smoke, with no interest in stopping. Not on beta-blocker because of COPD  Plan: On low-dose ramipril.  No longer on amlodipine (may want to reconsider with blood pressures increasing. Not on statin due to intolerance and resistance to using other medications. Still not listed to be on aspirin although he still supposed be on aspirin.  He takes NSAIDs.  At this point with him not having any chest pain and have a progressively worsening COPD, we talked about management going forward.  Would not check another ischemic evaluation unless he were to have symptoms of chest pain or pressure.      Relevant Orders   EKG 12-Lead (Completed)   Hyperlipidemia with target LDL less than 70 (Chronic)    Intolerant to statin.  Does not want to take any medication.  He did not want to go to CVRR to discuss other options.  As such, no need to recheck lipid panel.      Essential hypertension (Chronic)    Blood pressure is low but high today.  Usually lower than this at home, therefore reluctant to add or change medications.  Simply continue low-dose ramipril.      Relevant Orders   EKG 12-Lead (Completed)   Chronic diastolic heart failure (HCC) (Chronic)    Euvolemic on exam.  If anything is a little bit emaciated.  He rarely takes Lasix. On low-dose rare prophylactic reduction  otherwise avoiding excessive medications.        Other   S/P CABG x 4 (Chronic)   Relevant Orders   EKG 12-Lead (Completed)   Acute on chronic respiratory failure with hypoxia (HCC)    Now up to 4 L of oxygen by nasal cannula.  Progressively worsening COPD.  Starting to show signs of failure to thrive with weight loss, decreased p.o. intake and complete lack of mobility.  Multiple medications from pulmonary medicine and PCP.  I recommend PCP and pulmonary medicine initiate discussions reference CODE STATUS and end-of-life decision-making/living well.  With his level of COPD, we have chosen to avoid routine surveillance ischemic evaluations unless symptoms warrant.      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)       Relevant Orders   EKG 12-Lead (Completed)       ===================================  HPI:    John Ball is a 80 y.o. 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  Cardiac Catheterization January 2016 revealed occluded native RCA and occluded vein graft RCA.  No PCI.  Recommended medical management. Myoview Stress Test (September 12, 2017): EF 42%.  Inferior, inferoseptal defect consistent with scar.  No ischemia.  Read as Intermediate Risk because of low EF.  Echo ordered Echo (September 26, 2017): EF 50-55%.  More accurate - Mild  inferolateral HK (c/w known RCA occlusion).  GR 1 DD.  Mild LA dilation mildly increased PA pressures with mild RV/RA dilation. Myoview checked by [facility] ~May 2020 -- NON-ischemic per report. --Unfortunately, report not available.   [facility]  John Ball was last seen on January 31, 2020--noted progressively worsening dyspnea increase up to 3 L of home oxygen.  Uses scooter for doing anything more than just maneuvering around the room.  Has stopped to catch his breath after 10 feet.  Relatively  stable.  Not active.  Mild varicose veins and edema but nothing significant.  No PND or orthopnea.  No chest pain or pressure.  No symptoms of arrhythmia.  Recent Hospitalizations: none  Reviewed  CV studies:    The following studies were reviewed today: (if available, images/films reviewed: From Epic Chart or Care Everywhere) Vascular studies reviewed by Dr. Scot Dock:  Interval History:   John Ball returns for annual visit doing okay from a cardiac standpoint, just notes continued worsening exertional dyspnea.  Now on 4 L of oxygen.  Really limited mobility and activities due to dyspnea.  He uses a scooter for going any further than just around the house.  Although he has dyspnea with exertion, no chest pain or pressure.  Although there are PVCs noted on his EKG, he denies any palpitations. No PND or orthopnea, but does have some mild end of day edema.  Mostly ankle swelling.  Pretty much inactive.  Spends most the day watching television or sitting on the porch.  He had been almost catatonic while oversedated.  Doing much better since they reduced his dose of Seroquel and trazodone.  He is now more stabilized and more active and awake.  CV Review of Symptoms (Summary) Cardiovascular ROS: positive for - dyspnea on exertion, edema, irregular heartbeat, orthopnea, shortness of breath, and this does seem to be worse than it had been.  Also he has lost significant weight.  No energy. negative for - chest pain, paroxysmal nocturnal dyspnea, rapid heart rate, or syncope or near syncope, TIA/amaurosis fugax, does not walk enough to note claudication  REVIEWED OF SYSTEMS   Review of Systems  Constitutional:  Positive for malaise/fatigue (No energy.  Hardly does anything.) and weight loss (15 pounds as last visit).  HENT:  Positive for hearing loss. Negative for congestion and nosebleeds.   Respiratory:  Positive for cough, shortness of breath (Has gotten progressively worse) and wheezing.  Negative for hemoptysis and sputum production.        Now up to 4 L nasal cannula.  Has a concentrator that he takes with him, but uses a full tank at night.  Cardiovascular:  Positive for leg swelling (Trivial). Negative for chest pain.  Gastrointestinal:  Negative for blood in stool and melena.  Genitourinary:  Negative for dysuria, frequency and hematuria.  Musculoskeletal:  Positive for joint pain. Negative for falls.       Very limited mobility due to breathing issues.  Uses scooter for longer distances  Skin: Negative.        Bruising  Neurological:  Positive for weakness (Generalized). Negative for dizziness and focal weakness.  Psychiatric/Behavioral:  Positive for memory loss. Negative for depression (Does not seem quite depressed, but definitely his mood is down.). The patient is not nervous/anxious and does not have insomnia.    I have reviewed and (if needed) personally updated the patient's problem list, medications, allergies, past medical and surgical history, social and family  history.   PAST MEDICAL HISTORY   Past Medical History:  Diagnosis Date   Anxiety    Arthritis    Complication of anesthesia    hx prolonged post op oxygen dependent/  hx hallucinations for 2 day post op lobectomy 2012   COPD, severe ([place])    Depression    Essential hypertension    Hearing loss of both ears    does wear his hearing aids   History of arterial bypass of lower extremity    RIGHT FEM-POP   History of CHF (congestive heart failure)    History of DVT of lower extremity    History of panic attacks    Hyperlipidemia with target LDL less than 70    Left main coronary artery disease 2004   a) Severe LM CAD 2004 -->s/p  cabg x4 (LIMA-LAD, free RIMA-RI, SVG-OM 2, SVG-RPDA); b) 06/2014: Abnormal Myoview --> c)cardiac Cath Jan 2016: pLM 70%, pLAD 40-50%- patent LIMA-LAD.  RI -competitive flow noted from RIMA-RI graft, circumflex, normal caliber with moderate OM1.  OM 2 occluded.  SVG-OM 2  patent. 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    does not wear his dentures   Nocturia    OSA (obstructive sleep apnea)    uses O2  via [place]  --  intolerate to CPAP   Peripheral arterial occlusive disease ([place])    2011   a) Gore-Tex graft right AK popA-BK popA --> b) 2012: thrombosed graft --> thrombectomy with dacron patch = 1 V runnof via Peroneal; c) 01/2014: R femoropopliteal bypass with left femoral vein;; followed by dr Early--  per last dopplers 06-28-2014 no change right graft,  50-74% stenosis common and left mid superficial femoral artery   PTSD (post-traumatic stress disorder)    anxiety attack's---  [place] Vet   Recurrent productive cough    SMOKER'S COUGH   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--- a) 04/2011: R L Lobectomy & med node dissection ([id number]) - [facility] [place], [place]) w/o post-op Rx; b) CT-A Chest Jan 2013: R pl effusion, R hilar LAN (3.3 cm x 2.8 cm & 2.2 cm x 1.3 cm); c) 2/'13 PET-CT Chest: Bilat Hilar LAN, no distant Mets  d) CT chest 9/'14: Stable shotty hilar nodes bilaterally w/ no pathologic sized LAD or suspicious Pulm nodule; ;;  (oncologist at   Wears glasses     PAST SURGICAL HISTORY   Past Surgical History:  Procedure Laterality Date   ABDOMINAL AORTOGRAM N/A 11/12/2017   Procedure: ABDOMINAL AORTOGRAM;  Surgeon: Waynetta Sandy, MD;  Location: [place] [facility];  Service: Cardiovascular;  Laterality: N/A;   ABDOMINAL AORTOGRAM W/LOWER EXTREMITY Left 02/10/2018   Procedure: ABDOMINAL AORTOGRAM W/LOWER  EXTREMITY;  Surgeon: Serafina Mitchell, MD;  Location: [place] [facility];  Service: Cardiovascular;  Laterality: Left;   APPLICATION OF WOUND VAC Left 12/03/2017   Procedure: EXCHANGE OF WOUND VAC LEFT GROIN,;  Surgeon: Rosetta Posner, MD;  Location: [place];  Service:  Vascular;  Laterality: Left;   CARDIAC CATHETERIZATION  07/13/2002   Ischemia RCA region on Cardiolite// 60-70% ostial left main, 50% midCx, 60-70% mid RI,  70% mRCA, 90% JeNu and crux 75% RCA ,  95% PLA//  severe LM and 3Vessel CAD, perserved LV, ef 60%   COLONOSCOPY  2008    WNL   CORONARY ARTERY BYPASS GRAFT  07/27/2002   LIMA-LAD, freeRIMA-RI,SVG-OM2, SVG-rPDA; Dr. Roxan Hockey   CYSTOSCOPY W/ URETERAL STENT PLACEMENT N/A 02/21/2015   Procedure: BILATERAL RETROGRADE PYELOGRAM;  Surgeon: Festus Aloe, MD;  Location: [facility];  Service: Urology;  Laterality: N/A;   ENDARTERECTOMY FEMORAL Left 11/17/2017   Procedure: LEFT Femoral Endarterectomy with Patch Angioplasty;  Surgeon: Rosetta Posner, MD;  Location: [place];  Service: Vascular;  Laterality: Left;   EYE SURGERY Left 05/2012   FEMORAL ARTERY - POPLITEAL ARTERY BYPASS GRAFT Right 10-24-2008   dr early   w/ GoreTex graft   FEMORAL-POPLITEAL BYPASS GRAFT Left 02/11/2018   Procedure: BYPASS GRAFT LOWER EXTREMITY;  Surgeon: Rosetta Posner, MD;  Location: [facility];  Service: Vascular;  Laterality: Left;   FEMORAL-TIBIAL BYPASS GRAFT Right 01/10/2014   Procedure: Right Femoral to Posterior Tibial Bypass Graft using Left Leg Vein, Thrombectomy Right Common Femoral and Profunda of Leg . ;  Surgeon: Rosetta Posner, MD;  Location: [place];  Service: Vascular;  Laterality: Right;   HYDROCELE EXCISION Left    LEFT ANKLE SURGERY  1990's   LEFT HEART CATHETERIZATION WITH CORONARY/GRAFT ANGIOGRAM N/A 07/12/2014   Procedure: LEFT HEART CATHETERIZATION WITH Beatrix Fetters;  Surgeon: Troy Sine, MD;  Location: [facility];  Service: Cardiovascular;  Laterality: N/A;   pLM 70%, pLAD 40-50%- patent LIMA-LAD.  RI -competitive flow noted from fRIMA-RI graft, circumflex, normal caliber with moderate OM1.  OM 2 occluded.  SVG-OM 2 patent. RCA CTO after large RVM, faint R-R and LAD septal-PDA collateral   LOWER EXTREMITY ANGIOGRAPHY Bilateral 11/12/2017   Procedure: LOWER EXTREMITY ANGIOGRAPHY - Left Lower;  Surgeon: Waynetta Sandy, MD;  Location: [place] [facility];  Service: Cardiovascular;  Laterality: Bilateral;   NM MYOVIEW LTD  11/'15; 3/'19, 5/'20   a) Mod sized, mod intensity/partially reversible inferior defect c/w  prior infarct mild/mod peri-infarct ischemia.  Inferoseptal HK.  INTERMEDIATE RISK. --> Cath showed CTO of Native RCA & SVG-rPDA.; b) EF ~42%. Inferior-Inferoseptal defect - likely old scar - INTERMEDIATE RISK b/c low EF (Echo EF 50-55%) - med Rx; c) [place] -by report - Non-ischemic.   PERIPHERAL VASCULAR INTERVENTION Left 02/10/2018   Procedure: PERIPHERAL VASCULAR INTERVENTION;  Surgeon: Serafina Mitchell, MD;  Location: [place] [facility];  Service: Cardiovascular;  Laterality: Left;  left common iliac stent, attempted left SFA stent   RIGHT ABOVE KNEE POPLITEAL GRAFT TO BELOW KNEE POPLITEAL ARTERY BYPASS WITH REVERSE SAPHENOUS VEIN  03/02/2010   THROMBECTOMY OF RIGHT FEMORAL TO ABOVE KNEE POPLITEAL GORETEX GRAFT ANGIOPLASTY OF GORETEX AND SAPHENOUS VEIN JUNCTION AND ABOVE KNEE POPLITEAL ARTERY   10-06-2010   DR EARLY   TONSILLECTOMY     TRANSTHORACIC ECHOCARDIOGRAM  09/2017   EF 50-55%.  More accurate - Mild inferolateral HK (c/w known RCA occlusion).  GR 1 DD.  Mild LA dilation mildly increased PA pressures with mild RV/RA dilation.   TRANSURETHRAL RESECTION OF BLADDER TUMOR N/A 02/21/2015   Procedure: TRANSURETHRAL RESECTION OF BLADDER TUMOR (TURBT);  Surgeon: Festus Aloe, MD;  Location: [facility];  Service: Urology;  Laterality: N/A;  VIDEO ASSISTED THORACOSCOPY (VATS)/WEDGE RESECTION Right 05-01-2011     FORSYTH   LOWER LOBECTOMY W/  NODE DISSECTION   WOUND EXPLORATION Left 11/28/2017   Procedure: WOUND EXPLORATION GROIN, WASHOUT AND WOUND VAC APPLICATION;  Surgeon: Conrad Ferryville, MD;  Location: [place];  Service: Vascular;  Laterality: Left;    Immunization History  Administered Date(s) Administered   Fluad Quad(high Dose 65+) 04/14/2019   Influenza Split 03/14/2017, 03/17/2018, 04/17/2020   Influenza, High Dose Seasonal PF 04/07/2014, 04/27/2015, 04/07/2016, 04/04/2017, 04/03/2018   Influenza,inj,Quad PF,6+ Mos 03/05/2016   Influenza-Unspecified 04/07/2012, 05/08/2013, 04/08/2019, 04/21/2020   Meningococcal Polysaccharide 03/12/2017   Moderna Sars-Covid-2 Vaccination 08/18/2019, 09/15/2019, 04/10/2020, 06/23/2020   Pneumococcal Conjugate-13 04/29/2014   Pneumococcal Polysaccharide-23 11/05/2009, 05/03/2010, 03/12/2017   Td 07/10/2018   Td (Adult) 07/09/2018   Tdap 04/07/2006, 10/30/2006   Zoster Recombinat (Shingrix) 01/27/2020, 05/08/2020    MEDICATIONS/ALLERGIES   Current Meds  Medication Sig   acetaminophen (TYLENOL) 500 MG tablet Take 1,000 mg by mouth every 6 (six) hours as needed for moderate pain or headache.    ADVAIR DISKUS 500-50 MCG/DOSE AEPB Inhale 1 puff into the lungs 2 (two) times daily.    albuterol (PROVENTIL HFA;VENTOLIN HFA) 108 (90 BASE) MCG/ACT inhaler Inhale 1 puff into the lungs every 6 (six) hours as needed for wheezing or shortness of breath.   albuterol (PROVENTIL) (2.5 MG/3ML) 0.083% nebulizer solution    Carbomer Gel Base (HYDROGEL) GEL 1 g by Does not apply route daily.   Cholecalciferol (VITAMIN D3) 1000 UNITS CAPS Take 1,000 Units by mouth every morning.    CYMBALTA 60 MG capsule Take 60 mg by mouth daily.   furosemide (LASIX) 20 MG tablet Take 20 mg by mouth as needed.   mupirocin ointment (BACTROBAN) 2 % SMARTSIG:1 Application Topical 2-3 Times Daily   nitroGLYCERIN (NITROSTAT) 0.4 MG SL tablet Place 1 tablet (0.4 mg total) under the tongue every 5  (five) minutes as needed for chest pain.   OXYGEN Inhale 4 L into the lungs See admin instructions. Uses 4L at bedtime and if needed throughout the day -- intolerant to CPAP   prazosin (MINIPRESS) 1 MG capsule Take 1 mg by mouth at bedtime.   pregabalin (LYRICA) 75 MG capsule Take 1 capsule (75 mg total) by mouth 3 (three) times daily.   QUEtiapine (SEROQUEL XR) 300 MG 24 hr tablet Take 150 mg by mouth at bedtime.   ramipril (ALTACE) 2.5 MG capsule Take 2.5 mg by mouth every morning.    SYMBICORT 160-4.5 MCG/ACT inhaler Inhale 1 puff into the lungs as needed.   tiotropium (SPIRIVA) 18 MCG inhalation capsule Place 18 mcg into inhaler and inhale daily.   traZODone (DESYREL) 100 MG tablet Take 100 mg by mouth at bedtime.   vitamin B-12 (CYANOCOBALAMIN) 500 MCG tablet Take 1 tablet by mouth daily.   zolpidem (AMBIEN CR) 12.5 MG CR tablet Take 12.5 mg by mouth at bedtime.    Allergies  Allergen Reactions   Aspirin Other (See Comments)    Avoids due to severe bruises   Crestor [Rosuvastatin Calcium] Other (See Comments)    myalgia   Lipitor [Atorvastatin Calcium] Other (See Comments)    myalgia   Zetia [Ezetimibe] Other (See Comments)    myalgia   Zocor [Simvastatin - High Dose] Other (See Comments)    myalgia   Aripiprazole Other (See Comments)    Altered mental status   Effexor [Venlafaxine Hydrochloride] Itching   Morphine And Related Itching    SOCIAL  HISTORY/FAMILY HISTORY   Reviewed in Epic:  Pertinent findings:  Social History   Tobacco Use   Smoking status: Every Day    Packs/day: 1.00    Years: 60.00    Pack years: 60.00    Types: Cigarettes    Start date: 09/14/1957   Smokeless tobacco: Never   Tobacco comments:    1/4 ppk 09/28/20  Vaping Use   Vaping Use: Never used  Substance Use Topics   Alcohol use: No   Drug use: No   Social History   Social History Narrative   He is married with 2 children. Does not exercise regularly.   He and his wife have been  recently living in [place], [place] (but just moved back to [place], [place] in November 2018).   He still smokes about a half-pack a day and is not interested in quitting. No alcohol or recreational drug use.      I confirmed with him and his wife that he does have a DNR order signed and displayed.       OBJCTIVE -PE, EKG, labs   Wt Readings from Last 3 Encounters:  03/01/21 173 lb (78.5 kg)  08/23/20 188 lb (85.3 kg)  01/31/20 188 lb 3.2 oz (85.4 kg)    Physical Exam: BP (!) 143/67   Pulse 85   Ht 6' (1.829 m)   Wt 173 lb (78.5 kg)   SpO2 100%   BMI 23.46 kg/m  Physical Exam Constitutional:      General: He is not in acute distress.    Appearance: He is ill-appearing (Chronically ill-notably more prominent than last visit). He is not toxic-appearing.     Comments: Looks notably older than last visit.  Notable weight loss.  Smells like cigarettes.  HENT:     Head: Normocephalic and atraumatic.     Comments: Very poor dentition. Neck:     Vascular: No carotid bruit or JVD.  Cardiovascular:     Rate and Rhythm: Normal rate and regular rhythm. FrequentExtrasystoles are present.    Chest Wall: PMI is not displaced.     Pulses: Decreased pulses (Diminished pedal pulses.).     Heart sounds: S1 normal and S2 normal. Heart sounds are distant. No murmur heard.   No friction rub. No gallop.  Pulmonary:     Comments: Accessory muscle use for breathing with bucket-handle and pursed lip breathing. Sitting in tripod on the exam table;  Diminished breath sounds throughout with mild interstitial crackles but no rales or rhonchi.  Notable expiratory wheeze. Musculoskeletal:        General: Swelling (Trivial ankle swelling) present. Normal range of motion.     Cervical back: Normal range of motion and neck supple.  Skin:    General: Skin is warm.     Coloration: Skin is pale.     Findings: No lesion.  Neurological:     General: No focal deficit present.     Mental Status: He is  alert.     Comments: A&O x 3, but notably less responsive and talkative than last visit.  Psychiatric:        Behavior: Behavior normal.        Judgment: Judgment normal.     Comments: Somewhat flat affect.  Mood seems a little bit more down.    Adult ECG Report  Rate: 85 ;  Rhythm: normal sinus rhythm, premature ventricular contractions (PVC), and PVCs and bigeminy pattern.  Inferior T wave depressions with T wave inversions  also noted in lateral leads.-Cannot exclude ischemia versus repolarization changes. ;   Narrative Interpretation: PVCs more frequent, but otherwise stable.  Recent Labs: Not checked since 2020 Lab Results  Component Value Date   CHOL 166 03/24/2019   HDL 45 03/24/2019   LDLCALC 105 (H) 03/24/2019   TRIG 85 03/24/2019   CHOLHDL 3.8 09/25/2017   Lab Results  Component Value Date   CREATININE 1.22 04/14/2019   BUN 22 04/14/2019   NA 138 04/14/2019   K 4.3 04/14/2019   CL 101 04/14/2019   CO2 31 04/14/2019   CBC Latest Ref Rng & Units 04/14/2019 03/24/2019 02/12/2018  WBC 4.0 - 10.5 K/uL 4.6 4.1 5.8  Hemoglobin 13.0 - 17.0 g/dL 12.9(L) 13.2 11.0(L)  Hematocrit 39.0 - 52.0 % 38.0(L) 38.5 35.3(L)  Platelets 150.0 - 400.0 K/uL 134.0(L) 129(L) 129(L)    Lab Results  Component Value Date   HGBA1C 5.7 (H) 03/24/2019   Lab Results  Component Value Date   TSH 3.260 03/24/2019    ==================================================  COVID-19 Education: The signs and symptoms of COVID-19 were discussed with the patient and how to seek care for testing (follow up with PCP or arrange E-visit).    I spent a total of 20 minutes with the patient spent in direct patient consultation.  Additional time spent with chart review  / charting (studies, outside notes, etc): 12 min Total Time: 32 min  Current medicines are reviewed at length with the patient today.  (+/- concerns) N/A  This visit occurred during the SARS-CoV-2 public health emergency.  Safety protocols were  in place, including screening questions prior to the visit, additional usage of staff PPE, and extensive cleaning of exam room while observing appropriate contact time as indicated for disinfecting solutions.  Notice: This dictation was prepared with Dragon dictation along with smaller phrase technology. Any transcriptional errors that result from this process are unintentional and may not be corrected upon review.  Patient Instructions / Medication Changes & Studies & Tests Ordered   Patient Instructions  Medication Instructions:   NO CHANGES *If you need a refill on your cardiac medications before your next appointment, please call your pharmacy*   Lab Work: NOT NEEDED   Testing/Procedures: NOT NEEDED   Follow-Up: At [facility], you and your health needs are our priority.  As part of our continuing mission to provide you with exceptional heart care, we have created designated Provider Care Teams.  These Care Teams include your primary Cardiologist (physician) and Advanced Practice Providers (APPs -  Physician Assistants and Nurse Practitioners) who all work together to provide you with the care you need, when you need it.     Your next appointment:   12 month(s)  The format for your next appointment:   In Person  Provider:   Glenetta Hew, MD     Studies Ordered:   Orders Placed This Encounter  Procedures   EKG 12-Lead      Glenetta Hew, M.D., M.S. Interventional Cardiologist   Pager # [phone] Phone # [phone] [address] [place], [place] [address]   Thank you for choosing Heartcare at [facility]!!

## 2021-03-08 DIAGNOSIS — Z20822 Contact with and (suspected) exposure to covid-19: Secondary | ICD-10-CM | POA: Diagnosis not present

## 2021-03-11 ENCOUNTER — Encounter: Payer: Self-pay | Admitting: Cardiology

## 2021-03-11 NOTE — Assessment & Plan Note (Signed)
Now up to 4 L of oxygen by nasal cannula.  Progressively worsening COPD.  Starting to show signs of failure to thrive with weight loss, decreased p.o. intake and complete lack of mobility.  Multiple medications from pulmonary medicine and PCP.  I recommend PCP and pulmonary medicine initiate discussions reference CODE STATUS and end-of-life decision-making/living well.  With his level of COPD, we have chosen to avoid routine surveillance ischemic evaluations unless symptoms warrant.

## 2021-03-11 NOTE — Assessment & Plan Note (Signed)
Euvolemic on exam.  If anything is a little bit emaciated.  He rarely takes Lasix. On low-dose rare prophylactic reduction otherwise avoiding excessive medications.

## 2021-03-11 NOTE — Assessment & Plan Note (Signed)
Intolerant to statin.  Does not want to take any medication.  He did not want to go to CVRR to discuss other options.  As such, no need to recheck lipid panel.

## 2021-03-11 NOTE — Assessment & Plan Note (Signed)
Blood pressure is low but high today.  Usually lower than this at home, therefore reluctant to add or change medications.  Simply continue low-dose ramipril.

## 2021-03-11 NOTE — Assessment & Plan Note (Signed)
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 Thankfully, no further angina.  Mostly limited by COPD related dyspnea.  He continues to smoke, with no interest in stopping. Not on beta-blocker because of COPD  Plan: On low-dose ramipril.  No longer on amlodipine (may want to reconsider with blood pressures increasing. Not on statin due to intolerance and resistance to using other medications. Still not listed to be on aspirin although he still supposed be on aspirin.  He takes NSAIDs.  At this point with him not having any chest pain and have a progressively worsening COPD, we talked about management going forward.  Would not check another ischemic evaluation unless he were to have symptoms of chest pain or pressure.

## 2021-03-27 ENCOUNTER — Encounter: Payer: Self-pay | Admitting: Pulmonary Disease

## 2021-03-27 ENCOUNTER — Other Ambulatory Visit: Payer: Self-pay

## 2021-03-27 ENCOUNTER — Ambulatory Visit (INDEPENDENT_AMBULATORY_CARE_PROVIDER_SITE_OTHER): Payer: Medicare Other | Admitting: Pulmonary Disease

## 2021-03-27 VITALS — BP 122/62 | HR 55 | Temp 98.3°F | Ht 72.0 in | Wt 171.2 lb

## 2021-03-27 DIAGNOSIS — J449 Chronic obstructive pulmonary disease, unspecified: Secondary | ICD-10-CM | POA: Diagnosis not present

## 2021-03-27 DIAGNOSIS — I779 Disorder of arteries and arterioles, unspecified: Secondary | ICD-10-CM

## 2021-03-27 DIAGNOSIS — J9611 Chronic respiratory failure with hypoxia: Secondary | ICD-10-CM | POA: Diagnosis not present

## 2021-03-27 DIAGNOSIS — Z720 Tobacco use: Secondary | ICD-10-CM

## 2021-03-27 DIAGNOSIS — R911 Solitary pulmonary nodule: Secondary | ICD-10-CM

## 2021-03-27 MED ORDER — PREDNISONE 10 MG PO TABS: ORAL_TABLET | ORAL | 0 refills | Status: AC

## 2021-03-27 MED ORDER — AZITHROMYCIN 250 MG PO TABS: ORAL_TABLET | ORAL | 0 refills | Status: AC

## 2021-03-27 NOTE — Patient Instructions (Signed)
Zithromax antibiotic for 5 days  Prednisone for 6 days  Will arrange for CT chest in January 2023 and follow up after that

## 2021-03-27 NOTE — Progress Notes (Signed)
Pulmonary, Critical Care, and Sleep Medicine  Chief Complaint  Patient presents with   Follow-up    Sob increased x 2 mths., denies wheeze or cough    Constitutional:  BP 122/62 (BP Location: Left Arm, Cuff Size: Normal)   Pulse (!) 55   Temp 98.3 F (36.8 C) (Temporal)   Ht 6' (1.829 m)   Wt 171 lb 3.2 oz (77.7 kg)   SpO2 99%   BMI 23.22 kg/m   Past Medical History:  CAD s/p CABG, PTSD, PAD, OSA intolerant of CPAP, Neuropathy, HLD, DVT, diastolic CHF, HTN, Depression, OA, Anxiety  Past Surgical History:  He  has a past surgical history that includes Femoral-tibial Bypass Graft (Right, 01/10/2014); left heart catheterization with coronary/graft angiogram (N/A, 07/12/2014); Coronary artery bypass graft (07/27/2002); Tonsillectomy; Femoral artery - popliteal artery bypass graft (Right, 10-24-2008   dr early); RIGHT ABOVE KNEE POPLITEAL GRAFT TO BELOW KNEE POPLITEAL ARTERY BYPASS WITH REVERSE SAPHENOUS VEIN (03/02/2010); THROMBECTOMY OF RIGHT FEMORAL TO ABOVE KNEE POPLITEAL GORETEX GRAFT ANGIOPLASTY OF GORETEX AND SAPHENOUS VEIN JUNCTION AND ABOVE KNEE POPLITEAL ARTERY  (10-06-2010   DR EARLY); Video assisted thoracoscopy (vats)/wedge resection (Right, 05-01-2011    FORSYTH); Cardiac catheterization (07/13/2002); LEFT ANKLE SURGERY (1990's); Hydrocele surgery (Left); Eye surgery (Left, 05/2012); Colonoscopy (2008    WNL); Transurethral resection of bladder tumor (N/A, 02/21/2015); Cystoscopy w/ ureteral stent placement (N/A, 02/21/2015); NM MYOVIEW LTD (11/'15; 3/'19, 5/'20); Lower Extremity Angiography (Bilateral, 11/12/2017); ABDOMINAL AORTOGRAM (N/A, 11/12/2017); Endarterectomy femoral (Left, 11/17/2017); Wound exploration (Left, 11/28/2017); Application if wound vac (Left, 12/03/2017); ABDOMINAL AORTOGRAM W/LOWER EXTREMITY (Left, 02/10/2018); PERIPHERAL VASCULAR INTERVENTION (Left, 02/10/2018); Femoral-popliteal Bypass Graft (Left, 02/11/2018); and transthoracic echocardiogram  (09/2017).  Brief Summary:  John Ball is a 80 y.o. male smoker with COPD with emphysema and chronic bronchitis, and chronic respiratory failure with hypoxia.  He has hx of NSCLC s/p Rt lower lobectomy.      Subjective:   He is here with his wife.  He has been getting more short of breath.  Feels like his breathing is more labored.  Has been losing some weight.  Feels he can still use his inhaler effectively.  He uses 4 liters oxygen, but takes this off when sitting.  His wife says his breathing gets worse and his color changes until he puts his oxygen back on.  Physical Exam:   Appearance - sitting in wheelchair, using some accessory muscles, wearing oxygen  ENMT - no sinus tenderness, no oral exudate, no LAN, Mallampati 2 airway, no stridor  Respiratory - decreased breath sounds bilaterally, no wheezing or rales  CV - s1s2 regular rate and rhythm, no murmurs  Ext - no clubbing, no edema  Skin - no rashes  Psych - normal mood and affect   Pulmonary testing:  Spirometry 03/05/16 >> FEV1 1.21 (36%), FEV1% 54% ABG 03/07/16 >> pH 7.40, PaCO2 42.6, PaO2 59.3  Chest Imaging:  CT chest 04/20/15 >> atherosclerosis, mild centrilobular/paraseptal emphysema, s/p RLL ectomy CT angio chest 09/12/17 >> thickened Rt diaphragm, s/p RLLectomy, tree in bud CT chest 08/19/18 >> centrilobular/paraseptal emphysema, volume loss Rt hemithorax, decreased LAN CT chest 07/28/19 >> stable 4 mm RML nodule, new 3 mm nodule in lingula CT chest 07/21/20 >> stable  Cardiac Tests:  Echo 09/26/17 >> EF 50 to 55%, grade 1 DD, PAS 36 mmHg  Social History:  He  reports that he has been smoking cigarettes. He started smoking about 63 years ago. He has a 60.00 pack-year smoking  history. He has never used smokeless tobacco. He reports that he does not drink alcohol and does not use drugs.  Family History:  His family history includes Alzheimer's disease (age of onset: 70) in his father; Other (age of onset:  8) in his mother; Stroke in his father and mother.     Assessment/Plan:   COPD with emphysema and chronic bronchitis. - he has exacerbation - will give course of prednisone and zithromax - continue advair, spiriva, prn albuterol - if symptoms progress, then might need to switch to nebulizer maintenance therapy   Chronic respiratory failure with hypoxia. - he has PaO2 < 60 and chronic leg edema in setting of COPD - continue 4 liters 24/7   Lung nodules. - f/u CT chest without contrast in January 2023   Tobacco abuse. - he plans to continue gradually cutting down  Time Spent Involved in Patient Care on Day of Examination:  32 minutes   Follow up:   Patient Instructions  Zithromax antibiotic for 5 days  Prednisone for 6 days  Will arrange for CT chest in January 2023 and follow up after that  Medication List:   Allergies as of 03/27/2021       Reactions   Aspirin Other (See Comments)   Avoids due to severe bruises   Crestor [rosuvastatin Calcium] Other (See Comments)   myalgia   Lipitor [atorvastatin Calcium] Other (See Comments)   myalgia   Zetia [ezetimibe] Other (See Comments)   myalgia   Zocor [simvastatin - High Dose] Other (See Comments)   myalgia   Aripiprazole Other (See Comments)   Altered mental status   Effexor [venlafaxine Hydrochloride] Itching   Morphine And Related Itching        Medication List        Accurate as of March 27, 2021 12:10 PM. If you have any questions, ask your nurse or doctor.          STOP taking these medications    gabapentin 100 MG capsule Commonly known as: NEURONTIN Stopped by: Chesley Mires, MD   lidocaine 5 % Commonly known as: Lidoderm Stopped by: Chesley Mires, MD   mupirocin ointment 2 % Commonly known as: BACTROBAN Stopped by: Chesley Mires, MD   pregabalin 75 MG capsule Commonly known as: Lyrica Stopped by: Chesley Mires, MD   Symbicort 160-4.5 MCG/ACT inhaler Generic drug:  budesonide-formoterol Stopped by: Chesley Mires, MD   vitamin B-12 500 MCG tablet Commonly known as: CYANOCOBALAMIN Stopped by: Chesley Mires, MD   Vitamin D3 25 MCG (1000 UT) Caps Stopped by: Chesley Mires, MD   zolpidem 12.5 MG CR tablet Commonly known as: AMBIEN CR Stopped by: Chesley Mires, MD       TAKE these medications    acetaminophen 500 MG tablet Commonly known as: TYLENOL Take 1,000 mg by mouth every 6 (six) hours as needed for moderate pain or headache.   Advair Diskus 500-50 MCG/ACT Aepb Generic drug: fluticasone-salmeterol Inhale 1 puff into the lungs 2 (two) times daily.   albuterol 108 (90 Base) MCG/ACT inhaler Commonly known as: VENTOLIN HFA Inhale 1 puff into the lungs every 6 (six) hours as needed for wheezing or shortness of breath.   albuterol (2.5 MG/3ML) 0.083% nebulizer solution Commonly known as: PROVENTIL   azithromycin 250 MG tablet Commonly known as: ZITHROMAX Take 2 tablets (500 mg total) by mouth daily for 1 day, THEN 1 tablet (250 mg total) daily for 4 days. Start taking on: March 27, 2021 Started by: Elisabeth Cara  Decker Cogdell, MD   Cymbalta 60 MG capsule Generic drug: DULoxetine Take 60 mg by mouth daily.   furosemide 20 MG tablet Commonly known as: LASIX Take 20 mg by mouth as needed.   Hydrogel Gel 1 g by Does not apply route daily.   nitroGLYCERIN 0.4 MG SL tablet Commonly known as: NITROSTAT Place 1 tablet (0.4 mg total) under the tongue every 5 (five) minutes as needed for chest pain.   OXYGEN Inhale 4 L into the lungs See admin instructions. Uses 4L at bedtime and if needed throughout the day -- intolerant to CPAP   prazosin 1 MG capsule Commonly known as: MINIPRESS Take 1 mg by mouth at bedtime.   predniSONE 10 MG tablet Commonly known as: DELTASONE Take 3 tablets (30 mg total) by mouth daily with breakfast for 2 days, THEN 2 tablets (20 mg total) daily with breakfast for 2 days, THEN 1 tablet (10 mg total) daily with breakfast for  2 days. Start taking on: March 27, 2021 Started by: Chesley Mires, MD   QUEtiapine 300 MG 24 hr tablet Commonly known as: SEROQUEL XR Take 150 mg by mouth at bedtime.   ramipril 2.5 MG capsule Commonly known as: ALTACE Take 2.5 mg by mouth every morning.   tiotropium 18 MCG inhalation capsule Commonly known as: SPIRIVA Place 18 mcg into inhaler and inhale daily.   traZODone 100 MG tablet Commonly known as: DESYREL Take 100 mg by mouth at bedtime. What changed: Another medication with the same name was removed. Continue taking this medication, and follow the directions you see here. Changed by: Chesley Mires, MD        Signature:  Chesley Mires, MD [place] Pager - [phone] 03/27/2021, 12:10 PM

## 2021-04-07 DIAGNOSIS — Z20822 Contact with and (suspected) exposure to covid-19: Secondary | ICD-10-CM | POA: Diagnosis not present

## 2021-05-08 DIAGNOSIS — Z20822 Contact with and (suspected) exposure to covid-19: Secondary | ICD-10-CM | POA: Diagnosis not present

## 2021-07-13 ENCOUNTER — Emergency Department (HOSPITAL_COMMUNITY)
Admission: EM | Admit: 2021-07-13 | Discharge: 2021-07-13 | Discharge status: Against Medical Advice | Payer: Medicare Other | Attending: Emergency Medicine | Admitting: Emergency Medicine

## 2021-07-13 ENCOUNTER — Other Ambulatory Visit: Payer: Self-pay

## 2021-07-13 ENCOUNTER — Emergency Department (HOSPITAL_COMMUNITY): Payer: Medicare Other

## 2021-07-13 DIAGNOSIS — I509 Heart failure, unspecified: Secondary | ICD-10-CM | POA: Diagnosis not present

## 2021-07-13 DIAGNOSIS — J449 Chronic obstructive pulmonary disease, unspecified: Secondary | ICD-10-CM | POA: Insufficient documentation

## 2021-07-13 DIAGNOSIS — Z951 Presence of aortocoronary bypass graft: Secondary | ICD-10-CM | POA: Insufficient documentation

## 2021-07-13 DIAGNOSIS — R0902 Hypoxemia: Secondary | ICD-10-CM | POA: Diagnosis not present

## 2021-07-13 DIAGNOSIS — Z20822 Contact with and (suspected) exposure to covid-19: Secondary | ICD-10-CM | POA: Diagnosis not present

## 2021-07-13 DIAGNOSIS — I110 Hypertensive heart disease with heart failure: Secondary | ICD-10-CM | POA: Diagnosis not present

## 2021-07-13 DIAGNOSIS — I2510 Atherosclerotic heart disease of native coronary artery without angina pectoris: Secondary | ICD-10-CM | POA: Insufficient documentation

## 2021-07-13 DIAGNOSIS — J189 Pneumonia, unspecified organism: Secondary | ICD-10-CM | POA: Insufficient documentation

## 2021-07-13 DIAGNOSIS — R079 Chest pain, unspecified: Secondary | ICD-10-CM | POA: Diagnosis not present

## 2021-07-13 DIAGNOSIS — R0602 Shortness of breath: Secondary | ICD-10-CM | POA: Diagnosis not present

## 2021-07-13 LAB — CBC WITH DIFFERENTIAL/PLATELET
Abs Immature Granulocytes: 0.01 10*3/uL (ref 0.00–0.07)
Basophils Absolute: 0 10*3/uL (ref 0.0–0.1)
Basophils Relative: 1 %
Eosinophils Absolute: 0 10*3/uL (ref 0.0–0.5)
Eosinophils Relative: 1 %
HCT: 45 % (ref 39.0–52.0)
Hemoglobin: 14.6 g/dL (ref 13.0–17.0)
Immature Granulocytes: 0 %
Lymphocytes Relative: 26 %
Lymphs Abs: 0.8 10*3/uL (ref 0.7–4.0)
MCH: 35.8 pg — ABNORMAL HIGH (ref 26.0–34.0)
MCHC: 32.4 g/dL (ref 30.0–36.0)
MCV: 110.3 fL — ABNORMAL HIGH (ref 80.0–100.0)
Monocytes Absolute: 0.2 10*3/uL (ref 0.1–1.0)
Monocytes Relative: 8 %
Neutro Abs: 2.1 10*3/uL (ref 1.7–7.7)
Neutrophils Relative %: 64 %
Platelets: UNDETERMINED 10*3/uL (ref 150–400)
RBC: 4.08 MIL/uL — ABNORMAL LOW (ref 4.22–5.81)
RDW: 14.3 % (ref 11.5–15.5)
WBC: 3.2 10*3/uL — ABNORMAL LOW (ref 4.0–10.5)
nRBC: 0 % (ref 0.0–0.2)

## 2021-07-13 LAB — RESP PANEL BY RT-PCR (FLU A&B, COVID) ARPGX2
Influenza A by PCR: NEGATIVE
Influenza B by PCR: NEGATIVE
SARS Coronavirus 2 by RT PCR: NEGATIVE

## 2021-07-13 LAB — COMPREHENSIVE METABOLIC PANEL
ALT: 19 U/L (ref 0–44)
AST: 22 U/L (ref 15–41)
Albumin: 3.9 g/dL (ref 3.5–5.0)
Alkaline Phosphatase: 47 U/L (ref 38–126)
Anion gap: 8 (ref 5–15)
BUN: 23 mg/dL (ref 8–23)
CO2: 28 mmol/L (ref 22–32)
Calcium: 9.2 mg/dL (ref 8.9–10.3)
Chloride: 101 mmol/L (ref 98–111)
Creatinine, Ser: 1.28 mg/dL — ABNORMAL HIGH (ref 0.61–1.24)
GFR, Estimated: 57 mL/min — ABNORMAL LOW (ref >60)
Glucose, Bld: 133 mg/dL — ABNORMAL HIGH (ref 70–99)
Potassium: 3.7 mmol/L (ref 3.5–5.1)
Sodium: 137 mmol/L (ref 135–145)
Total Bilirubin: 0.8 mg/dL (ref 0.3–1.2)
Total Protein: 6.4 g/dL — ABNORMAL LOW (ref 6.5–8.1)

## 2021-07-13 LAB — BRAIN NATRIURETIC PEPTIDE: B Natriuretic Peptide: 1058.3 pg/mL — ABNORMAL HIGH (ref 0.0–100.0)

## 2021-07-13 LAB — TROPONIN I (HIGH SENSITIVITY)
Troponin I (High Sensitivity): 32 ng/L — ABNORMAL HIGH (ref <18)
Troponin I (High Sensitivity): 31 ng/L — ABNORMAL HIGH (ref <18)

## 2021-07-13 MED ORDER — FUROSEMIDE 10 MG/ML IJ SOLN
40.0000 mg | Freq: Once | INTRAMUSCULAR | Status: AC
  Administered 2021-07-13: 40 mg via INTRAVENOUS
  Filled 2021-07-13: qty 4

## 2021-07-13 MED ORDER — METHYLPREDNISOLONE 4 MG PO TBPK: ORAL_TABLET | ORAL | 0 refills | Status: DC

## 2021-07-13 MED ORDER — LEVOFLOXACIN 750 MG PO TABS: 750.0000 mg | ORAL_TABLET | Freq: Every day | ORAL | 0 refills | Status: AC

## 2021-07-13 MED ORDER — METHYLPREDNISOLONE SODIUM SUCC 125 MG IJ SOLR
125.0000 mg | Freq: Once | INTRAMUSCULAR | Status: AC
  Administered 2021-07-13: 125 mg via INTRAVENOUS
  Filled 2021-07-13: qty 2

## 2021-07-13 MED ORDER — IPRATROPIUM-ALBUTEROL 0.5-2.5 (3) MG/3ML IN SOLN
3.0000 mL | Freq: Once | RESPIRATORY_TRACT | Status: AC
  Administered 2021-07-13: 3 mL via RESPIRATORY_TRACT
  Filled 2021-07-13: qty 6

## 2021-07-13 MED ORDER — SODIUM CHLORIDE 0.9 % IV SOLN: 500.0000 mg | Freq: Once | INTRAVENOUS | Status: DC

## 2021-07-13 MED ORDER — SODIUM CHLORIDE 0.9 % IV SOLN: 1.0000 g | Freq: Once | INTRAVENOUS | Status: DC

## 2021-07-13 NOTE — ED Notes (Signed)
Pt out w/ wife via wheelchair. Pt in moderate respiratory distress upon leaving, on Home O2, 6lpm [place] and out to parking lot to smoke cigarette. Respiratory rate in the 40s, last sat before leaving was 78% on ear probe. Accessory muscle use noted, pt tripoding in wheelchair, audible wheezes and rhonchi.

## 2021-07-13 NOTE — ED Triage Notes (Signed)
Pt here POV with c/o SOB X2 weeks, increaseing worse over several days. Pt uses 4 liters [place] at baseline. Smoker. 76% 4 liters in triage. Pt lung sounds diminished.

## 2021-07-13 NOTE — ED Provider Notes (Signed)
[facility] EMERGENCY DEPARTMENT Provider Note   CSN: [id number] Arrival date & time: 07/13/21  1041     History  Chief Complaint  Patient presents with   Shortness of Breath    John Ball is a 81 y.o. male.  HPI  81 year old male with past medical history of CAD status post CABG, CHF, COPD on 4 L nasal cannula baseline presents to the emergency department accompanied by wife for concern of shortness of breath.  Patient is very hard of hearing, history limited secondary to this.  Majority of the history obtained from the wife.  She states for the past couple weeks has had a significant decline.  He has been having worsening swelling of his lower extremities, worsening shortness of breath and intermittent productive cough.  No documented fever.  Home Medications Prior to Admission medications   Medication Sig Start Date End Date Taking? Authorizing Provider  acetaminophen (TYLENOL) 500 MG tablet Take 1,000 mg by mouth every 6 (six) hours as needed for moderate pain or headache.    Yes [provider]  ADVAIR DISKUS 500-50 MCG/DOSE AEPB Inhale 1 puff into the lungs 2 (two) times daily.  03/03/17  Yes [provider]  albuterol (PROVENTIL HFA;VENTOLIN HFA) 108 (90 BASE) MCG/ACT inhaler Inhale 1 puff into the lungs every 6 (six) hours as needed for wheezing or shortness of breath.   Yes [provider]  albuterol (PROVENTIL) (2.5 MG/3ML) 0.083% nebulizer solution Take 2.5 mg by nebulization every 6 (six) hours as needed for wheezing or shortness of breath. 03/15/18  Yes [provider]  cholecalciferol (VITAMIN D) 25 MCG (1000 UNIT) tablet Take 1,000 Units by mouth daily. 04/20/21  Yes [provider]  Cyanocobalamin (VITAMIN B 12) 500 MCG TABS Take 500 mcg by mouth daily. 04/16/21  Yes [provider]  CYMBALTA 60 MG capsule Take 60 mg by mouth daily. 10/31/19  Yes [provider]  furosemide (LASIX) 20 MG  tablet Take 20 mg by mouth daily as needed for fluid or edema. 09/27/19  Yes [provider]  levofloxacin (LEVAQUIN) 750 MG tablet Take 1 tablet (750 mg total) by mouth daily for 5 days. 07/13/21 07/18/21 Yes Kima Malenfant, Alvin Critchley, DO  methylPREDNISolone (MEDROL DOSEPAK) 4 MG TBPK tablet Take as directed 07/13/21  Yes Eliazar Olivar, Alvin Critchley, DO  nitroGLYCERIN (NITROSTAT) 0.4 MG SL tablet Place 1 tablet (0.4 mg total) under the tongue every 5 (five) minutes as needed for chest pain. 08/28/17  Yes Leonie Man, MD  OXYGEN Inhale 4 L into the lungs See admin instructions. Uses 4L at bedtime and if needed throughout the day -- intolerant to CPAP   Yes [provider]  prazosin (MINIPRESS) 1 MG capsule Take 1 mg by mouth at bedtime.   Yes [provider]  QUEtiapine (SEROQUEL XR) 300 MG 24 hr tablet Take 150 mg by mouth at bedtime.   Yes [provider]  ramipril (ALTACE) 2.5 MG capsule Take 2.5 mg by mouth every morning.    Yes [provider]  tiotropium (SPIRIVA) 18 MCG inhalation capsule Place 18 mcg into inhaler and inhale daily.   Yes [provider]  traZODone (DESYREL) 100 MG tablet Take 100 mg by mouth at bedtime.   Yes [provider]  Carbomer Gel Base (HYDROGEL) GEL 1 g by Does not apply route daily. Patient not taking: Reported on 07/13/2021 12/16/17   Dagoberto Ligas, PA-C      Allergies  Aspirin, Crestor [rosuvastatin calcium], Lipitor [atorvastatin calcium], Zetia [ezetimibe], Zocor [simvastatin - high dose], Aripiprazole, Effexor [venlafaxine hydrochloride], and Morphine and related    Review of Systems   Review of Systems  Reason unable to perform ROS: very hard of hearing.   Physical Exam Updated Vital Signs BP (!) 107/93    Pulse 87    Temp (!) 97.5 F (36.4 C) (Oral)    Resp (!) 21    SpO2 (!) 88%  Physical Exam Vitals and nursing note reviewed.  Constitutional:      Appearance: Normal appearance. He is ill-appearing.   HENT:     Head: Normocephalic.     Mouth/Throat:     Mouth: Mucous membranes are moist.  Cardiovascular:     Rate and Rhythm: Normal rate.  Pulmonary:     Effort: Tachypnea and accessory muscle usage present. No respiratory distress.     Breath sounds: Examination of the right-lower field reveals wheezing. Examination of the left-lower field reveals wheezing. Decreased breath sounds, wheezing and rales present.  Abdominal:     Palpations: Abdomen is soft.     Tenderness: There is no abdominal tenderness.  Musculoskeletal:     Right lower leg: Edema present.     Left lower leg: Edema present.     Comments: + Doppler DP pulses  Skin:    General: Skin is warm.  Neurological:     Mental Status: He is alert. Mental status is at baseline.     Comments: Baseline per wife    ED Results / Procedures / Treatments   Labs (all labs ordered are listed, but only abnormal results are displayed) Labs Reviewed  CBC WITH DIFFERENTIAL/PLATELET - Abnormal; Notable for the following components:      Result Value   WBC 3.2 (*)    RBC 4.08 (*)    MCV 110.3 (*)    MCH 35.8 (*)    All other components within normal limits  COMPREHENSIVE METABOLIC PANEL - Abnormal; Notable for the following components:   Glucose, Bld 133 (*)    Creatinine, Ser 1.28 (*)    Total Protein 6.4 (*)    GFR, Estimated 57 (*)    All other components within normal limits  BRAIN NATRIURETIC PEPTIDE - Abnormal; Notable for the following components:   B Natriuretic Peptide 1,058.3 (*)    All other components within normal limits  TROPONIN I (HIGH SENSITIVITY) - Abnormal; Notable for the following components:   Troponin I (High Sensitivity) 32 (*)    All other components within normal limits  TROPONIN I (HIGH SENSITIVITY) - Abnormal; Notable for the following components:   Troponin I (High Sensitivity) 31 (*)    All other components within normal limits  RESP PANEL BY RT-PCR (FLU A&B, COVID) [id number]     EKG None  Radiology DG Chest Port 1 View  Result Date: 07/13/2021 CLINICAL DATA:  Shortness of breath EXAM: PORTABLE CHEST 1 VIEW COMPARISON:  Previous studies including chest radiographs done on 04/14/2019 and CT done on 07/21/2020 FINDINGS: Transverse diameter of heart is increased. There is previous coronary bypass surgery. There are no signs of alveolar pulmonary edema. There is new alveolar density in the right lower lung fields. Blunting of right lateral CP angle may be due to pleural thickening or small effusion. There is no pneumothorax. IMPRESSION: There is new alveolar density in the right lower lung fields suggesting atelectasis/pneumonia or loculated pleural effusion. Linear densities in the right lower lung fields and blunting of right  lateral CP angle have not changed significantly, possibly postsurgical change. There are no signs of alveolar pulmonary edema. Electronically Signed   By: Elmer Picker M.D.   On: 07/13/2021 12:41    Procedures Procedures    Medications Ordered in ED Medications  cefTRIAXone (ROCEPHIN) 1 g in sodium chloride 0.9 % 100 mL IVPB (has no administration in time range)  azithromycin (ZITHROMAX) 500 mg in sodium chloride 0.9 % 250 mL IVPB (has no administration in time range)  ipratropium-albuterol (DUONEB) 0.5-2.5 (3) MG/3ML nebulizer solution 3 mL (3 mLs Nebulization Given 07/13/21 1206)  methylPREDNISolone sodium succinate (SOLU-MEDROL) 125 mg/2 mL injection 125 mg (125 mg Intravenous Given 07/13/21 1206)  furosemide (LASIX) injection 40 mg (40 mg Intravenous Given 07/13/21 1407)    ED Course/ Medical Decision Making/ A&P                           Medical Decision Making   This patient presents to the ED for concern of shortness of breath, this involves an extensive number of treatment options, and is a complaint that carries with it a high risk of complications and morbidity.  The differential diagnosis includes COPD exacerbation, hypoxia, PE,  pneumonia, CHF   Additional history obtained: -Additional history obtained from wife due to patient being so hard of hearing.  Patient refuses to write as a form of interview. -External records from outside source obtained and reviewed including: Chart review including previous notes, labs, imaging, consultation notes   Lab Tests: -I ordered, reviewed, and interpreted labs.  The pertinent results include: Troponin of 31, elevated BNP, baseline CBC and CMP, flu and COVID is negative   EKG -Sinus rhythm with a PAC, otherwise similar to previous   Imaging Studies ordered: -I ordered imaging studies including chest x-ray -I independently visualized and interpreted imaging which showed cardiomegaly with concern for developing infiltrate -I agree with the radiologist interpretation   Medicines ordered and prescription drug management: -I ordered medication including IV steroid, DuoNeb, IV diuretic, antibiotics for COPD exacerbation, pneumonia, CHF -Reevaluation of the patient after these medicines showed that the patient stayed the same -I have reviewed the patients home medicines and have made adjustments as needed   ED Course: 81 year old male presents emergency department with worsening shortness of breath.  Accompanied by his wife who is the primary history giver.  Patient is very hard of hearing, angry at times.  Refuses to write in regards to interview.  She states that over the past couple weeks has had a productive cough, worsening shortness of breath even on his baseline 4 L of nasal cannula.  He is tachypneic, noted to be hypoxic on his 4 L of nasal cannula.  Diminished breath sounds with scattered wheezes.  Treated initially for possible COPD exacerbation.  Chest x-ray shows cardiomegaly, findings of developing infiltrate.  Blood work is concerning for CHF.  Originally plan for COPD exacerbation treatment with diuresis and antibiotics for pneumonia.  After the patient was given a  diuretic he had a brief episode of hypotension that self resolved.  At time of admission patient became very agitated, pulled off all of his monitors.  I was able to converse with the patient by yelling loudly.  He was able to confirm his name, date of birth, current status in the hospital.  He states that he wants to go home and understands that he is leaving [place].  He is currently in respiratory distress and  I expressed my concern that once he leaves this could result in deterioration and even death.  He understands this, he states that death is inevitable.  He states he does not want to stay in the hospital.  He wants to go home and smoke a cigarette.  I offered a nicotine patch which he angrily refused.  Patient is demanding to go home and refusing any sort of admission.  Wife at bedside is understanding of the situation, was able to verbalize to me as well the AMA parameters.  I will send the patient on antibiotics and steroids for home treatment with hopes that he returns.  Wife understands and will drive him home.   Critical Interventions: DuoNeb, IV steroids, IV diuretic   Cardiac Monitoring: The patient was maintained on a cardiac monitor.  I personally viewed and interpreted the cardiac monitored which showed an underlying rhythm of: Sinus   Reevaluation: After the interventions noted above, I reevaluated the patient and found that they have :stayed the same   Dispostion: I have recommended that the patient stays in the department for the completion of their workup however they decline.  I have expressed the importance of staying including the risks of leaving which include worsening condition, permanent disability and death.  Patient accepts these risks.  They are alert and oriented, they have capacity to make this decision.  I have not been able to convince the patient to stay and they understand the risks of leaving [place].          Final  Clinical Impression(s) / ED Diagnoses Final diagnoses:  Hypoxia  Community acquired pneumonia, unspecified laterality  Heart failure, unspecified HF chronicity, unspecified heart failure type [facility])    Rx / DC Orders ED Discharge Orders          Ordered    levofloxacin (LEVAQUIN) 750 MG tablet  Daily        07/13/21 1501    methylPREDNISolone (MEDROL DOSEPAK) 4 MG TBPK tablet        07/13/21 1501              Tayja Manzer, Alvin Critchley, DO 07/13/21 1552

## 2021-07-13 NOTE — ED Notes (Signed)
Pen doppler at bedside.

## 2021-07-13 NOTE — Discharge Instructions (Signed)
Take antibiotic and steroid as directed.  It is very important that you continue to take your furosemide diuretic as well.  I have recommended that the patient stays in the department for the completion of their workup however they decline.  I have expressed the importance of staying including the risks of leaving which include worsening condition, permanent disability and death.  Patient accepts these risks.  They are alert and oriented, they have capacity to make this decision.  I have not been able to convince the patient to stay and they understand the risks of leaving [facility].

## 2021-07-13 NOTE — ED Notes (Signed)
Entered room to assess pt after BP noted to be 69/34. Pt was seated on edge of bed and demanding that all monitoring equipment and IV access be removed. Pt stated adamantly that he is leaving at this time. Educated pt as to risks of leaving, pt will not listen to anything this RN says, MD to bedside. MD explained pt that he has CHF exacerbation and pneumonia, and requires hospitalization. Pt states he does not care and he is leaving. Extensive conversation had with MD, this RN, pt and pt wife about risks of leaving up to and including death. Pt acknowledges possibility of death, states he does not care.

## 2021-07-16 ENCOUNTER — Encounter: Payer: Self-pay | Admitting: Nurse Practitioner

## 2021-07-16 ENCOUNTER — Ambulatory Visit (INDEPENDENT_AMBULATORY_CARE_PROVIDER_SITE_OTHER): Payer: Medicare Other | Admitting: Nurse Practitioner

## 2021-07-16 VITALS — BP 108/72 | HR 75 | Ht 72.0 in | Wt 178.1 lb

## 2021-07-16 DIAGNOSIS — J189 Pneumonia, unspecified organism: Secondary | ICD-10-CM

## 2021-07-16 DIAGNOSIS — J441 Chronic obstructive pulmonary disease with (acute) exacerbation: Secondary | ICD-10-CM | POA: Diagnosis not present

## 2021-07-16 DIAGNOSIS — R0602 Shortness of breath: Secondary | ICD-10-CM | POA: Diagnosis not present

## 2021-07-16 DIAGNOSIS — I509 Heart failure, unspecified: Secondary | ICD-10-CM | POA: Diagnosis not present

## 2021-07-16 MED ORDER — FUROSEMIDE 40 MG PO TABS: 40.0000 mg | ORAL_TABLET | Freq: Every day | ORAL | 2 refills | Status: DC | PRN

## 2021-07-16 NOTE — Progress Notes (Signed)
Established Patient Office Visit  Subjective:  Patient ID: John Ball, male    DOB: 09/25/1940  Age: 81 y.o. MRN: [id number]  CC:  Chief Complaint  Patient presents with   Hospitalization Follow-up    HPI John Ball presents for ER visit. Was seen in ER on 07/13/2021. Went to ER due to shortness of breath. He did have pneumonia and congestive heart failure. Was given IV lasix due to CHF one time.  His BNP was >1000. The patient does see cardiology, but not until a year from now. The patient is severely short of breath. Having to lean over to breath. Chest x-ray did show a possible pneumonia in right lower lobe of the lung. He is currently on levofloxacin 750mg  daily and a medrol taper. Patient states that he is not feeling any better. Feet and lower legs are moderately swollen.   Past Medical History:  Diagnosis Date   Anxiety    Arthritis    Complication of anesthesia    hx prolonged post op oxygen dependent/  hx hallucinations for 2 day post op lobectomy 2012   COPD, severe ([place])    Depression    Essential hypertension    Hearing loss of both ears    does wear his hearing aids   History of arterial bypass of lower extremity    RIGHT FEM-POP   History of CHF (congestive heart failure)    History of DVT of lower extremity    History of panic attacks    Hyperlipidemia with target LDL less than 70    Left main coronary artery disease 2004   a) Severe LM CAD 2004 -->s/p  cabg x4 (LIMA-LAD, free RIMA-RI, SVG-OM 2, SVG-RPDA); b) 06/2014: Abnormal Myoview --> c)cardiac Cath Jan 2016: pLM 70%, pLAD 40-50%- patent LIMA-LAD.  RI -competitive flow noted from RIMA-RI graft, circumflex, normal caliber with moderate OM1.  OM 2 occluded.  SVG-OM 2 patent. 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    does not wear his dentures   Nocturia    OSA (obstructive sleep apnea)    uses O2  via [place]  --  intolerate  to CPAP   Peripheral arterial occlusive disease ([place])    2011   a) Gore-Tex graft right AK popA-BK popA --> b) 2012: thrombosed graft --> thrombectomy with dacron patch = 1 V runnof via Peroneal; c) 01/2014: R femoropopliteal bypass with left femoral vein;; followed by dr Early--  per last dopplers 06-28-2014 no change right graft,  50-74% stenosis common and left mid superficial femoral artery   PTSD (post-traumatic stress disorder)    anxiety attack's---  [place] [facility]   Recurrent productive cough    SMOKER'S COUGH   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--- a) 04/2011: R L Lobectomy & med node dissection ([id number]) - [facility] [place], [place]) w/o post-op Rx; b) CT-A Chest Jan 2013: R pl effusion, R hilar LAN (3.3 cm x 2.8 cm & 2.2 cm x 1.3 cm);  c) 2/'13 PET-CT Chest: Bilat Hilar LAN, no distant Mets  d) CT chest 9/'14: Stable shotty hilar nodes bilaterally w/ no pathologic sized LAD or suspicious Pulm nodule; ;;  (oncologist at   Wears glasses     Past Surgical History:  Procedure Laterality Date   ABDOMINAL AORTOGRAM N/A 11/12/2017   Procedure: ABDOMINAL AORTOGRAM;  Surgeon: Waynetta Sandy, MD;  Location: [facility];  Service: Cardiovascular;  Laterality: N/A;   ABDOMINAL AORTOGRAM W/LOWER EXTREMITY Left 02/10/2018   Procedure: ABDOMINAL AORTOGRAM W/LOWER EXTREMITY;  Surgeon: Serafina Mitchell, MD;  Location: [facility];  Service: Cardiovascular;  Laterality: Left;   APPLICATION OF WOUND VAC Left 12/03/2017   Procedure: EXCHANGE OF WOUND VAC LEFT GROIN,;  Surgeon: Rosetta Posner, MD;  [facility]: [facility];  Service:  Vascular;  Laterality: Left;   CARDIAC CATHETERIZATION  07/13/2002   Ischemia RCA region on Cardiolite// 60-70% ostial left main, 50% midCx, 60-70% mid RI,  70% mRCA, 90% JeNu and crux 75% RCA ,  95% PLA//  severe LM and 3Vessel CAD, perserved LV, ef 60%   COLONOSCOPY  2008    WNL   CORONARY ARTERY BYPASS GRAFT  07/27/2002   LIMA-LAD, freeRIMA-RI,SVG-OM2, SVG-rPDA; Dr. Roxan Hockey   CYSTOSCOPY W/ URETERAL STENT PLACEMENT N/A 02/21/2015   Procedure: BILATERAL RETROGRADE PYELOGRAM;  Surgeon: Festus Aloe, MD;  Location: [facility];  Service: Urology;  Laterality: N/A;   ENDARTERECTOMY FEMORAL Left 11/17/2017   Procedure: LEFT Femoral Endarterectomy with Patch Angioplasty;  Surgeon: Rosetta Posner, MD;  Location: [facility];  Service: Vascular;  Laterality: Left;   EYE SURGERY Left 05/2012   FEMORAL ARTERY - POPLITEAL ARTERY BYPASS GRAFT Right 10-24-2008   dr early   w/ GoreTex graft   FEMORAL-POPLITEAL BYPASS GRAFT Left 02/11/2018   Procedure: BYPASS GRAFT LOWER EXTREMITY;  Surgeon: Rosetta Posner, MD;  [facility];  Service: Vascular;  Laterality: Left;   FEMORAL-TIBIAL BYPASS GRAFT Right 01/10/2014   Procedure: Right Femoral to Posterior Tibial Bypass Graft using Left Leg Vein, Thrombectomy Right Common Femoral and Profunda of Leg . ;  Surgeon: Rosetta Posner, MD;  Location: [facility];  Service: Vascular;  Laterality: Right;   HYDROCELE EXCISION Left    LEFT ANKLE SURGERY  1990's   LEFT HEART CATHETERIZATION WITH CORONARY/GRAFT ANGIOGRAM N/A 07/12/2014   Procedure: LEFT HEART CATHETERIZATION WITH Beatrix Fetters;  Surgeon: Troy Sine, MD;  Location: [facility];  Service: Cardiovascular;  Laterality: N/A;  pLM 70%, pLAD 40-50%- patent LIMA-LAD.  RI -competitive flow noted from fRIMA-RI graft, circumflex, normal caliber with moderate OM1.  OM 2 occluded.  SVG-OM 2 patent. RCA CTO after large RVM, faint R-R and LAD septal-PDA collateral   LOWER EXTREMITY ANGIOGRAPHY  Bilateral 11/12/2017   Procedure: LOWER EXTREMITY ANGIOGRAPHY - Left Lower;  Surgeon: Waynetta Sandy, MD;  Location: [facility];  Service: Cardiovascular;  Laterality: Bilateral;   NM MYOVIEW LTD  11/'15; 3/'19, 5/'20   a) Mod sized, mod intensity/partially reversible inferior defect c/w  prior infarct mild/mod peri-infarct ischemia.  Inferoseptal HK.  INTERMEDIATE RISK. --> Cath showed CTO of Native RCA & SVG-rPDA.; b) EF ~42%. Inferior-Inferoseptal defect - likely old scar - INTERMEDIATE RISK b/c low EF (Echo EF 50-55%) - med Rx; c) [place] -by report - Non-ischemic.   PERIPHERAL VASCULAR INTERVENTION Left 02/10/2018   Procedure: PERIPHERAL VASCULAR INTERVENTION;  Surgeon: Serafina Mitchell, MD;  Location: [facility];  Service: Cardiovascular;  Laterality: Left;  left common iliac stent, attempted left SFA stent   RIGHT ABOVE KNEE POPLITEAL GRAFT TO BELOW KNEE POPLITEAL ARTERY BYPASS WITH REVERSE SAPHENOUS VEIN  03/02/2010   THROMBECTOMY OF RIGHT FEMORAL TO ABOVE KNEE POPLITEAL GORETEX GRAFT ANGIOPLASTY OF GORETEX AND SAPHENOUS VEIN JUNCTION AND ABOVE KNEE POPLITEAL ARTERY   10-06-2010   DR EARLY   TONSILLECTOMY     TRANSTHORACIC ECHOCARDIOGRAM  09/2017   EF 50-55%.  More accurate - Mild inferolateral HK (c/w known RCA occlusion).  GR 1 DD.  Mild LA dilation mildly increased PA pressures with mild RV/RA dilation.   TRANSURETHRAL RESECTION OF BLADDER TUMOR N/A 02/21/2015   Procedure: TRANSURETHRAL RESECTION OF BLADDER TUMOR (TURBT);  Surgeon: Festus Aloe, MD;  Location: [facility];  Service: Urology;  Laterality: N/A;   VIDEO ASSISTED THORACOSCOPY (VATS)/WEDGE RESECTION Right 05-01-2011    [place]   LOWER LOBECTOMY W/  NODE DISSECTION   WOUND EXPLORATION Left 11/28/2017   Procedure: WOUND EXPLORATION GROIN, WASHOUT AND WOUND VAC APPLICATION;  Surgeon: Conrad Salem, MD;  Location: [facility];  Service: Vascular;  Laterality: Left;    Family  History  Problem Relation Age of Onset   Other Mother 81       Abdominal Aortic Aneurysm    Stroke Mother    Alzheimer's disease Father 49   Stroke Father     Social History   Socioeconomic History   Marital status: Married    Spouse name: Not on file   Number of children: 2   Years of education: Not on file   Highest education level: Not on file  Occupational History   Not on file  Tobacco Use   Smoking status: Every Day    Packs/day: 1.00    Years: 60.00    Pack years: 60.00    Types: Cigarettes    Start date: 09/14/1957   Smokeless tobacco: Never   Tobacco comments:    1/4 ppk 09/28/20  Vaping Use   Vaping Use: Never used  Substance and Sexual Activity   Alcohol use: No   Drug use: No   Sexual activity: Not on file  Other Topics Concern   Not on file  Social History Narrative   He is married with 2 children. Does not exercise regularly.   He and his wife have been recently living in [place], [place] (but just moved back to [place], [place] in November 2018).   He still smokes about a half-pack a day and is not interested in quitting. No alcohol or recreational drug use.      I confirmed with him and his wife that he does have a DNR order signed and displayed.      Social Determinants of Health   Financial Resource Strain: Not on file  Food Insecurity: Not on file  Transportation Needs: Not on file  Physical Activity: Not on file  Stress: Not on file  Social Connections: Not on file  Intimate Partner Violence: Not on file    Outpatient Medications Prior to Visit  Medication Sig Dispense Refill   acetaminophen (TYLENOL) 500 MG tablet Take 1,000 mg by mouth every 6 (six) hours as needed for moderate pain or headache.      ADVAIR DISKUS 500-50 MCG/DOSE AEPB Inhale 1 puff into the lungs 2 (two) times daily.      albuterol (PROVENTIL HFA;VENTOLIN HFA) 108 (90 BASE) MCG/ACT inhaler Inhale 1 puff into the lungs every 6 (six) hours as needed for wheezing or  shortness  of breath.     albuterol (PROVENTIL) (2.5 MG/3ML) 0.083% nebulizer solution Take 2.5 mg by nebulization every 6 (six) hours as needed for wheezing or shortness of breath.     cholecalciferol (VITAMIN D) 25 MCG (1000 UNIT) tablet Take 1,000 Units by mouth daily.     CYMBALTA 60 MG capsule Take 60 mg by mouth daily.     levofloxacin (LEVAQUIN) 750 MG tablet Take 1 tablet (750 mg total) by mouth daily for 5 days. 5 tablet 0   methylPREDNISolone (MEDROL DOSEPAK) 4 MG TBPK tablet Take as directed 1 each 0   nitroGLYCERIN (NITROSTAT) 0.4 MG SL tablet Place 1 tablet (0.4 mg total) under the tongue every 5 (five) minutes as needed for chest pain. 25 tablet 6   OXYGEN Inhale 4 L into the lungs See admin instructions. Uses 4L at bedtime and if needed throughout the day -- intolerant to CPAP     prazosin (MINIPRESS) 1 MG capsule Take 1 mg by mouth at bedtime.     QUEtiapine (SEROQUEL XR) 300 MG 24 hr tablet Take 150 mg by mouth at bedtime.     ramipril (ALTACE) 2.5 MG capsule Take 2.5 mg by mouth every morning.      tiotropium (SPIRIVA) 18 MCG inhalation capsule Place 18 mcg into inhaler and inhale daily.     traZODone (DESYREL) 100 MG tablet Take 100 mg by mouth at bedtime.     Cyanocobalamin (VITAMIN B 12) 500 MCG TABS Take 500 mcg by mouth daily. (Patient not taking: Reported on 07/17/2021)     furosemide (LASIX) 20 MG tablet Take 20 mg by mouth daily as needed for fluid or edema.     Carbomer Gel Base (HYDROGEL) GEL 1 g by Does not apply route daily. (Patient not taking: Reported on 07/13/2021) 100 g 0   No facility-administered medications prior to visit.    Allergies  Allergen Reactions   Aspirin Other (See Comments)    Avoids due to severe bruises   Crestor [Rosuvastatin Calcium] Other (See Comments)    myalgia   Lipitor [Atorvastatin Calcium] Other (See Comments)    myalgia   Zetia [Ezetimibe] Other (See Comments)    myalgia   Zocor [Simvastatin - High Dose] Other (See Comments)    myalgia    Aripiprazole Other (See Comments)    Altered mental status   Effexor [Venlafaxine Hydrochloride] Itching   Morphine And Related Itching    ROS Review of Systems  Constitutional:  Positive for activity change and fatigue. Negative for chills and fever.  HENT:  Positive for congestion and postnasal drip. Negative for rhinorrhea, sinus pressure, sinus pain, sneezing and sore throat.   Eyes: Negative.   Respiratory:  Positive for cough, shortness of breath and wheezing.   Cardiovascular:  Positive for leg swelling. Negative for chest pain and palpitations.  Gastrointestinal:  Negative for constipation, diarrhea, nausea and vomiting.  Endocrine: Negative for cold intolerance, heat intolerance, polydipsia and polyuria.  Genitourinary:  Negative for dysuria, frequency and urgency.  Musculoskeletal:  Negative for back pain and myalgias.  Skin:  Negative for rash.  Allergic/Immunologic: Negative for environmental allergies.  Neurological:  Positive for tremors. Negative for dizziness, weakness and headaches.  Psychiatric/Behavioral:  The patient is nervous/anxious.      Objective:    Physical Exam Vitals and nursing note reviewed.  Constitutional:      General: He is in acute distress.     Appearance: Normal appearance. He is well-developed. He is ill-appearing.  HENT:     Head: Normocephalic and atraumatic.     Nose: Nose normal.     Mouth/Throat:     Mouth: Mucous membranes are moist.  Eyes:     Extraocular Movements: Extraocular movements intact.     Conjunctiva/sclera: Conjunctivae normal.     Pupils: Pupils are equal, round, and reactive to light.  Cardiovascular:     Rate and Rhythm: Normal rate and regular rhythm.     Pulses: Normal pulses.     Heart sounds: Normal heart sounds.  Pulmonary:     Effort: Tachypnea and accessory muscle usage present.     Breath sounds: Decreased air movement present. Wheezing present.  Abdominal:     Palpations: Abdomen is soft.   Musculoskeletal:        General: Normal range of motion.     Cervical back: Normal range of motion and neck supple.     Right lower leg: Edema present.     Left lower leg: Edema present.     Comments: There is 1+ pitting edema present bilaterally   Lymphadenopathy:     Cervical: No cervical adenopathy.  Skin:    General: Skin is warm and dry.     Capillary Refill: Capillary refill takes less than 2 seconds.  Neurological:     General: No focal deficit present.     Mental Status: He is alert and oriented to person, place, and time.  Psychiatric:        Mood and Affect: Mood normal.        Behavior: Behavior normal.        Thought Content: Thought content normal.        Judgment: Judgment normal.   Today's Vitals   07/16/21 1357  BP: 108/72  Pulse: 75  SpO2: 98%  Weight: 178 lb 1.9 oz (80.8 kg)  Height: 6' (1.829 m)   Body mass index is 24.16 kg/m.   Wt Readings from Last 3 Encounters:  07/17/21 177 lb 12.8 oz (80.6 kg)  07/16/21 178 lb 1.9 oz (80.8 kg)  03/27/21 171 lb 3.2 oz (77.7 kg)     Health Maintenance Due  Topic Date Due   Hepatitis C Screening  Never done   INFLUENZA VACCINE  02/05/2021   COVID-19 Vaccine (6 - Booster for Moderna series) 03/22/2021    There are no preventive care reminders to display for this patient.  Lab Results  Component Value Date   TSH 3.260 03/24/2019   Lab Results  Component Value Date   WBC 3.2 (L) 07/13/2021   HGB 14.6 07/13/2021   HCT 45.0 07/13/2021   MCV 110.3 (H) 07/13/2021   PLT PLATELET CLUMPS NOTED ON SMEAR, UNABLE TO ESTIMATE 07/13/2021   Lab Results  Component Value Date   NA 137 07/13/2021   K 3.7 07/13/2021   CHLORIDE 107 05/02/2014   CO2 28 07/13/2021   GLUCOSE 133 (H) 07/13/2021   BUN 23 07/13/2021   CREATININE 1.28 (H) 07/13/2021   BILITOT 0.8 07/13/2021   ALKPHOS 47 07/13/2021   AST 22 07/13/2021   ALT 19 07/13/2021   PROT 6.4 (L) 07/13/2021   ALBUMIN 3.9 07/13/2021   CALCIUM 9.2 07/13/2021    ANIONGAP 8 07/13/2021   GFR 57.73 (L) 04/14/2019   Lab Results  Component Value Date   CHOL 166 03/24/2019   Lab Results  Component Value Date   HDL 45 03/24/2019   Lab Results  Component Value Date   LDLCALC 105 (H) 03/24/2019  Lab Results  Component Value Date   TRIG 85 03/24/2019   Lab Results  Component Value Date   CHOLHDL 3.8 09/25/2017   Lab Results  Component Value Date   HGBA1C 5.7 (H) 03/24/2019      Assessment & Plan:  1. Acute on chronic congestive heart failure, unspecified heart failure type [facility]) Reviewed labs drawn during ER visit. BNP >1000. Increased furosemide to 40mg  daily as needed. Contact cardiology for patient to have urgent appointment.   2. SOB (shortness of breath) Patient should continue with medrol taper as prescribed. Increased furosemide to 40mg  daily. Continue to use inhalers and respiratory treatments as prescribed.   3. Pneumonia of right lower lobe due to infectious organism Chest x-ray done in ER suspicious for right lower lobe pneumonia. Currently on levofloxacin 750mg  daily. He should continue to take antibiotics until the prescription is gone. He should follow up with pulmonology as scheduled.   4. COPD exacerbation ([place]) Continue with medrol taper as prescribed. Use inhalers and respiratory medications as prescribed.  Pulmonology follow up as scheduled   Problem List Items Addressed This Visit       Cardiovascular and Mediastinum   Acute on chronic congestive heart failure (HCC) - Primary     Respiratory   COPD exacerbation ([place])   Pneumonia of right lower lobe due to infectious organism     Other   SOB (shortness of breath)    Meds ordered this encounter  Medications   DISCONTD: furosemide (LASIX) 40 MG tablet    Sig: Take 1 tablet (40 mg total) by mouth daily as needed for fluid or edema.    Dispense:  30 tablet    Refill:  2    CHF with fluid collection    Order Specific Question:   Supervising Provider     Answer:   Beatrice Lecher D [[id number]]    Follow-up: Return in about 6 weeks (around 08/27/2021) for medicare wellness, FBW a week prior to visit.    Ronnell Freshwater, NP

## 2021-07-16 NOTE — Progress Notes (Signed)
Cardiology Office Note:    Date:  07/17/2021   ID:  John Ball, DOB 1941/02/15, MRN [id number]  PCP:  Ronnell Freshwater, NP   [facility] Providers Cardiologist:  Glenetta Hew, MD     Referring MD: Ronnell Freshwater, NP   CC:  DOD HF VS COPD  History of Present Illness:    John Ball is a 81 y.o. male with a hx of Severe COPD, prior CABG, active smoking with recent ED eval 07/13/21 for CHF. Seen 07/17/21.  Patient notes that he is doing terribly.  Interval ED visit 07/13/21- BNP 1058.  Given IV lasix, given steroids.    Patient refused admission- noted that he served this [place] for 22 years and when he skipped the line of everyone waiting in the ED to get his breathing under control, he felt it wasn't right.  His wife notes that he has been wanting a magic pill to end all of his breathing issues.  No SI, but patient notes that he has lived for 80 years and just wants to be comfortable.  No chest pain or pressure.   Notes SOB and swelling in the legs.  Wife has increase lasix to 40 mg PO daily that was started this morning.  He has urinated twice through our eval (urinal brought in).  Previously was unable to lay flat, hasn't slept well since last night because he cannot lay flat.  Still smoking but has cut down.    Past Medical History:  Diagnosis Date   Anxiety    Arthritis    Complication of anesthesia    hx prolonged post op oxygen dependent/  hx hallucinations for 2 day post op lobectomy 2012   COPD, severe ([place])    Depression    Essential hypertension    Hearing loss of both ears    does wear his hearing aids   History of arterial bypass of lower extremity    RIGHT FEM-POP   History of CHF (congestive heart failure)    History of DVT of lower extremity    History of panic attacks    Hyperlipidemia with target LDL less than 70    Left main coronary artery disease 2004   a) Severe LM CAD 2004 -->s/p  cabg x4 (LIMA-LAD, free RIMA-RI, SVG-OM 2, SVG-RPDA); b)  06/2014: Abnormal Myoview --> c)cardiac Cath Jan 2016: pLM 70%, pLAD 40-50%- patent LIMA-LAD.  RI -competitive flow noted from RIMA-RI graft, circumflex, normal caliber with moderate OM1.  OM 2 occluded.  SVG-OM 2 patent. 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    does not wear his dentures   Nocturia    OSA (obstructive sleep apnea)    uses O2  via [place]  --  intolerate to CPAP   Peripheral arterial occlusive disease ([place])    2011   a) Gore-Tex graft right AK popA-BK popA --> b) 2012: thrombosed graft --> thrombectomy with dacron patch = 1 V runnof via [place]; c) 01/2014: R femoropopliteal bypass with left femoral vein;; followed by dr Early--  per last dopplers 06-28-2014 no change right graft,  50-74% stenosis common and left mid superficial femoral artery   PTSD (post-traumatic stress disorder)    anxiety attack's---  [place] V[employer]   Recurrent productive cough    SMOKER'S COUGH   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--- a) 04/2011: R L Lobectomy & med node dissection ([id number]) - [facility] [place], [place]) w/o post-op Rx; b) CT-A Chest Jan 2013: R pl effusion, R hilar LAN (3.3 cm x 2.8 cm & 2.2 cm x 1.3 cm); c) 2/'13 PET-CT Chest: Bilat Hilar LAN, no distant Mets  d) CT chest 9/'14: Stable shotty hilar nodes bilaterally w/ no pathologic sized LAD or suspicious Pulm nodule; ;;  (oncologist at   Wears glasses     Past Surgical History:  Procedure Laterality Date   ABDOMINAL AORTOGRAM N/A 11/12/2017   Procedure: ABDOMINAL AORTOGRAM;  Surgeon: Waynetta Sandy, MD;  Location: [facility];  Service: Cardiovascular;  Laterality: N/A;   ABDOMINAL AORTOGRAM W/LOWER EXTREMITY Left 02/10/2018   Procedure: ABDOMINAL AORTOGRAM W/LOWER EXTREMITY;  Surgeon: Serafina Mitchell, MD;  Location: [facility];  Service: Cardiovascular;  Laterality: Left;   APPLICATION OF WOUND VAC Left 12/03/2017   Procedure: EXCHANGE OF WOUND VAC LEFT GROIN,;  Surgeon: Rosetta Posner, MD;  [facility]: [facility];  Service: Vascular;  Laterality: Left;   CARDIAC CATHETERIZATION  07/13/2002   Ischemia RCA region on Cardiolite// 60-70% ostial left main, 50% midCx, 60-70% mid RI,  70% mRCA, 90% JeNu and crux 75% RCA ,  95% PLA//  severe LM and 3Vessel CAD, perserved LV, ef 60%   COLONOSCOPY  2008    WNL   CORONARY ARTERY BYPASS GRAFT  07/27/2002   LIMA-LAD, freeRIMA-RI,SVG-OM2, SVG-rPDA; Dr. Roxan Hockey   CYSTOSCOPY W/ URETERAL STENT PLACEMENT N/A 02/21/2015   Procedure: BILATERAL RETROGRADE PYELOGRAM;  Surgeon: Festus Aloe, MD;  Location[facility];  Service: Urology;  Laterality: N/A;   ENDARTERECTOMY FEMORAL Left 11/17/2017   Procedure: LEFT Femoral Endarterectomy with Patch Angioplasty;  Surgeon: Rosetta Posner, MD;  Location: [facility];  Service: Vascular;  Laterality: Left;   EYE SURGERY Left 05/2012   FEMORAL ARTERY - POPLITEAL ARTERY BYPASS GRAFT Right 10-24-2008   dr early   w/ GoreTex graft   FEMORAL-POPLITEAL BYPASS GRAFT Left 02/11/2018   Procedure: BYPASS GRAFT LOWER EXTREMITY;  Surgeon: Rosetta Posner, MD;  [facility];  Service: Vascular;  Laterality: Left;   FEMORAL-TIBIAL BYPASS GRAFT Right 01/10/2014   Procedure: Right Femoral to Posterior Tibial Bypass Graft using Left Leg Vein, Thrombectomy Right Common Femoral and Profunda of Leg . ;  Surgeon: Rosetta Posner, MD;  Location: [facility];  Service: Vascular;  Laterality: Right;   HYDROCELE EXCISION Left    LEFT ANKLE SURGERY  1990's   LEFT HEART CATHETERIZATION WITH CORONARY/GRAFT  ANGIOGRAM N/A 07/12/2014   Procedure: LEFT HEART CATHETERIZATION WITH Beatrix Fetters;  Surgeon: Troy Sine, MD;  Location: [facility];  Service: Cardiovascular;  Laterality: N/A;  pLM 70%, pLAD 40-50%- patent LIMA-LAD.  RI -competitive flow noted from fRIMA-RI graft, circumflex, normal caliber with moderate OM1.  OM 2 occluded.  SVG-OM 2 patent. RCA CTO after large RVM, faint R-R and LAD septal-PDA collateral   LOWER EXTREMITY ANGIOGRAPHY Bilateral 11/12/2017   Procedure: LOWER EXTREMITY ANGIOGRAPHY - Left Lower;  Surgeon: Waynetta Sandy, MD;  Location: [facility];  Service: Cardiovascular;  Laterality: Bilateral;   NM MYOVIEW LTD  11/'15; 3/'19, 5/'20   a) Mod sized, mod intensity/partially reversible inferior defect c/w  prior infarct mild/mod peri-infarct ischemia.  Inferoseptal HK.  INTERMEDIATE RISK. --> Cath showed CTO of Native RCA & SVG-rPDA.; b) EF ~42%. Inferior-Inferoseptal defect - likely old scar - INTERMEDIATE RISK b/c low EF (Echo EF 50-55%) - med Rx; c) [place] -by report - Non-ischemic.   PERIPHERAL VASCULAR INTERVENTION Left 02/10/2018   Procedure: PERIPHERAL VASCULAR INTERVENTION;  Surgeon: Serafina Mitchell, MD;  Location: [facility];  Service: Cardiovascular;  Laterality: Left;  left common iliac stent, attempted left SFA stent   RIGHT ABOVE KNEE POPLITEAL GRAFT TO BELOW KNEE POPLITEAL ARTERY BYPASS WITH REVERSE SAPHENOUS VEIN  03/02/2010   THROMBECTOMY OF RIGHT FEMORAL TO ABOVE KNEE POPLITEAL GORETEX GRAFT ANGIOPLASTY OF GORETEX AND SAPHENOUS VEIN JUNCTION AND ABOVE KNEE POPLITEAL ARTERY   10-06-2010   DR EARLY   TONSILLECTOMY     TRANSTHORACIC ECHOCARDIOGRAM  09/2017   EF 50-55%.  More accurate - Mild inferolateral HK (c/w known RCA occlusion).  GR 1 DD.  Mild LA dilation mildly increased PA pressures with mild RV/RA dilation.   TRANSURETHRAL RESECTION OF BLADDER TUMOR N/A 02/21/2015   Procedure: TRANSURETHRAL RESECTION OF BLADDER  TUMOR (TURBT);  Surgeon: Festus Aloe, MD;  Location: [facility];  Service: Urology;  Laterality: N/A;   VIDEO ASSISTED THORACOSCOPY (VATS)/WEDGE RESECTION Right 05-01-2011    FOR[place]TH   LOWER LOBECTOMY W/  NODE DISSECTION   WOUND EXPLORATION Left 11/28/2017   Procedure: WOUND EXPLORATION GROIN, WASHOUT AND WOUND VAC APPLICATION;  Surgeon: Conrad Egegik, MD;  Location: [facility];  Service: Vascular;  Laterality: Left;    Current Medications: Current Meds  Medication Sig   acetaminophen (TYLENOL) 500 MG tablet Take 1,000 mg by mouth every 6 (six) hours as needed for moderate pain or headache.    ADVAIR DISKUS 500-50 MCG/DOSE AEPB Inhale 1 puff into the lungs 2 (two) times daily.    albuterol (PROVENTIL HFA;VENTOLIN HFA) 108 (90 BASE) MCG/ACT inhaler Inhale 1 puff into the lungs every 6 (six) hours as needed for wheezing or shortness of breath.   albuterol (PROVENTIL) (2.5 MG/3ML) 0.083% nebulizer solution Take 2.5 mg by nebulization every 6 (six) hours as needed for wheezing or shortness of breath.   cholecalciferol (VITAMIN D) 25 MCG (1000 UNIT) tablet Take 1,000 Units by mouth daily.   CYMBALTA 60 MG capsule Take 60 mg by mouth daily.   furosemide (LASIX) 40 MG tablet Take 1 tablet (40 mg total) by mouth 2 (two) times daily.   levofloxacin (LEVAQUIN) 750 MG tablet Take 1 tablet (750 mg total) by mouth daily for 5 days.   methylPREDNISolone (MEDROL DOSEPAK) 4 MG TBPK tablet Take as directed   metolazone (ZAROXOLYN) 2.5 MG tablet Take 1 tablet on Wednesday 07/18/21 and Friday 07/20/21   nitroGLYCERIN (NITROSTAT) 0.4 MG SL tablet Place 1 tablet (0.4 mg total) under the tongue every 5 (five) minutes as needed for chest pain.   OXYGEN Inhale 4 L into the lungs See admin instructions. Uses 4L at bedtime and if needed throughout the day -- intolerant to CPAP   prazosin (MINIPRESS) 1 MG capsule Take 1 mg by mouth at bedtime.   QUEtiapine (SEROQUEL XR) 300 MG 24 hr tablet Take 150 mg by  mouth at bedtime.   ramipril (ALTACE) 2.5 MG capsule Take 2.5 mg by mouth every morning.    tiotropium (SPIRIVA)  18 MCG inhalation capsule Place 18 mcg into inhaler and inhale daily.   traZODone (DESYREL) 100 MG tablet Take 100 mg by mouth at bedtime.   [DISCONTINUED] furosemide (LASIX) 40 MG tablet Take 1 tablet (40 mg total) by mouth daily as needed for fluid or edema.     Allergies:   Aspirin, Crestor [rosuvastatin calcium], Lipitor [atorvastatin calcium], Zetia [ezetimibe], Zocor [simvastatin - high dose], Aripiprazole, Effexor [venlafaxine hydrochloride], and Morphine and related   Social History   Socioeconomic History   Marital status: Married    Spouse name: Not on file   Number of children: 2   Years of education: Not on file   Highest education level: Not on file  Occupational History   Not on file  Tobacco Use   Smoking status: Every Day    Packs/day: 1.00    Years: 60.00    Pack years: 60.00    Types: Cigarettes    Start date: 09/14/1957   Smokeless tobacco: Never   Tobacco comments:    1/4 ppk 09/28/20  Vaping Use   Vaping Use: Never used  Substance and Sexual Activity   Alcohol use: No   Drug use: No   Sexual activity: Not on file  Other Topics Concern   Not on file  Social History Narrative   He is married with 2 children. Does not exercise regularly.   He and his wife have been recently living in [place], [place] (but just moved back to [place], [place] in November 2018).   He still smokes about a half-pack a day and is not interested in quitting. No alcohol or recreational drug use.      I confirmed with him and his wife that he does have a DNR order signed and displayed.      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  ROS:   Please see the history of present illness.     All other systems reviewed and are negative.  EKGs/Labs/Other Studies Reviewed:    The following studies were reviewed today:  Transthoracic Echocardiogram: Date: 09/26/2017 Results: Study Conclusions   - Left ventricle: The cavity size was normal. Systolic function was    at the lower limits of normal. The estimated ejection fraction    was in the range of 50% to 55%. Possible mild hypokinesis of the    inferolateral myocardium. Doppler parameters are consistent with    abnormal left ventricular relaxation (grade 1 diastolic    dysfunction).  - Left atrium: The atrium was mildly dilated.  - Right ventricle: The cavity size was mildly dilated. Wall    thickness was normal.  - Right atrium: The atrium was mildly dilated.  - Pulmonary arteries: Systolic pressure was mildly increased. PA    peak pressure: 36 mm Hg (S).   Recent Labs: 07/13/2021: ALT 19; B Natriuretic Peptide 1,058.3; BUN 23; Creatinine, Ser 1.28; Hemoglobin 14.6; Platelets PLATELET CLUMPS NOTED ON SMEAR, UNABLE TO ESTIMATE; Potassium 3.7; Sodium 137  Recent Lipid Panel    Component Value Date/Time   CHOL 166 03/24/2019  0831   TRIG 85 03/24/2019 0831   HDL 45 03/24/2019 0831   CHOLHDL 3.8 09/25/2017 1420   CHOLHDL 4.9 07/22/2016 0456   VLDL 16 07/22/2016 0456   LDLCALC 105 (H) 03/24/2019 0831    Physical Exam:    VS:  BP 100/60    Pulse 62    Ht 6' (1.829 m)    Wt 80.6 kg    SpO2 98%    BMI 24.11 kg/m     Wt Readings from Last 3 Encounters:  07/17/21 80.6 kg  07/16/21 80.8 kg  03/27/21 77.7 kg     Gen: Acute distress, Elderly male   Neck: JVD to lower third of neck at 90 degrees Ears: Bilateral Pilar Plate Sign Cardiac: No Rubs or Gallops, no Murmur, distant heart sounds regular  rhythm +2 radial pulses Respiratory: Bilateral expiratory wheezes with tachypnea GI: Soft, nontender, non-distended  MS: +2 bilateral edema;  moves all extremities Integument: Skin feels warm Neuro:  At time of evaluation, alert and oriented to person/place/time/situation  Psych: Distressed affect, patient feels unwell   ASSESSMENT:    1. Chronic obstructive pulmonary disease, unspecified COPD type ([place])   2. Smoker unmotivated to quit   3. COPD exacerbation ([place])   4. Chronic diastolic heart failure ([facility])    PLAN:    Severe COPD on 4 L O2 Recent PNA Acute on chronic diastolic heart failure - breathing appears to be multi-focal in nature, there appears to be a HF component  - we have recommended hospitalization.  Wife is agreement.  Patient refuses is AOX4.  Understands the risks. We have begun to broach his over all goals of care.   - we have reviewed the red flag symptoms that would strongly encourage him seek emergent care - trial of lasix 40 mg PO BID and metolazone 2.5 mg PO on 1/11 and 1/13. - needs TOC follow up next week  Discussed with patient that I am on of the cardiologist on this weekend, and would be happy to assist in his care if he ends up admitted  Time Spent Directly with Patient:   I have spent a total of 40 minutes with the patient reviewing notes, imaging, EKGs, labs and examining the patient as well as establishing an assessment and plan that was discussed personally with the patient.  > 50% of time was spent in direct patient care and family.        Medication Adjustments/Labs and Tests Ordered: Current medicines are reviewed at length with the patient today.  Concerns regarding medicines are outlined above.  No orders of the defined types were placed in this encounter.  Meds ordered this encounter  Medications   furosemide (LASIX) 40 MG tablet    Sig: Take 1 tablet (40 mg total) by mouth 2 (two) times daily.    Dispense:  180 tablet    Refill:   3   metolazone (ZAROXOLYN) 2.5 MG tablet    Sig: Take 1 tablet on Wednesday 07/18/21 and Friday 07/20/21    Dispense:  2 tablet    Refill:  0    Patient Instructions  Medication Instructions:  Your physician has recommended you make the following change in your medication:  INCREASE: furosemide to 40 mg by mouth twice daily  TAKE: metolazone 2.5 mg on Wednesday Jan. 11, 2023 and 1 tablet on Friday Jan. 13, 2023  *If you need a refill on your cardiac medications before your next appointment, please call your pharmacy*   Lab Work:  NONE If you have labs (blood work) drawn today and your tests are completely normal, you will receive your results only by: [place] (if you have MyChart) OR A paper copy in the mail If you have any lab test that is abnormal or we need to change your treatment, we will call you to review the results.   Testing/Procedures: NONE   Follow-Up: At [facility], you and your health needs are our priority.  As part of our continuing mission to provide you with exceptional heart care, we have created designated Provider Care Teams.  These Care Teams include your primary Cardiologist (physician) and Advanced Practice Providers (APPs -  Physician Assistants and Nurse Practitioners) who all work together to provide you with the care you need, when you need it.   Your next appointment:   1 week(s)  The format for your next appointment:   In Person  Provider: Heart Failure TOC or DOD (Dr. Ellyn Hack is primary Cardiologist)  1}    Other Instructions Call 911 if pt develops symptoms discussed with Dr. Gasper Sells.     Signed, Werner Lean, MD  07/17/2021 1:09 PM    [place]

## 2021-07-17 ENCOUNTER — Other Ambulatory Visit: Payer: Self-pay

## 2021-07-17 ENCOUNTER — Encounter: Payer: Self-pay | Admitting: Internal Medicine

## 2021-07-17 ENCOUNTER — Ambulatory Visit (INDEPENDENT_AMBULATORY_CARE_PROVIDER_SITE_OTHER): Payer: Medicare Other | Admitting: Internal Medicine

## 2021-07-17 VITALS — BP 100/60 | HR 62 | Ht 72.0 in | Wt 177.8 lb

## 2021-07-17 DIAGNOSIS — I5032 Chronic diastolic (congestive) heart failure: Secondary | ICD-10-CM

## 2021-07-17 DIAGNOSIS — J441 Chronic obstructive pulmonary disease with (acute) exacerbation: Secondary | ICD-10-CM | POA: Diagnosis not present

## 2021-07-17 DIAGNOSIS — I509 Heart failure, unspecified: Secondary | ICD-10-CM | POA: Insufficient documentation

## 2021-07-17 DIAGNOSIS — F17200 Nicotine dependence, unspecified, uncomplicated: Secondary | ICD-10-CM

## 2021-07-17 DIAGNOSIS — J449 Chronic obstructive pulmonary disease, unspecified: Secondary | ICD-10-CM

## 2021-07-17 MED ORDER — FUROSEMIDE 40 MG PO TABS: 40.0000 mg | ORAL_TABLET | Freq: Two times a day (BID) | ORAL | 3 refills | Status: DC

## 2021-07-17 MED ORDER — METOLAZONE 2.5 MG PO TABS: ORAL_TABLET | ORAL | 0 refills | Status: DC

## 2021-07-17 NOTE — Patient Instructions (Signed)
Medication Instructions:  Your physician has recommended you make the following change in your medication:  INCREASE: furosemide to 40 mg by mouth twice daily  TAKE: metolazone 2.5 mg on Wednesday Jan. 11, 2023 and 1 tablet on Friday Jan. 13, 2023  *If you need a refill on your cardiac medications before your next appointment, please call your pharmacy*   Lab Work: NONE If you have labs (blood work) drawn today and your tests are completely normal, you will receive your results only by: [place] (if you have MyChart) OR A paper copy in the mail If you have any lab test that is abnormal or we need to change your treatment, we will call you to review the results.   Testing/Procedures: NONE   Follow-Up: At [facility], you and your health needs are our priority.  As part of our continuing mission to provide you with exceptional heart care, we have created designated Provider Care Teams.  These Care Teams include your primary Cardiologist (physician) and Advanced Practice Providers (APPs -  Physician Assistants and Nurse Practitioners) who all work together to provide you with the care you need, when you need it.   Your next appointment:   1 week(s)  The format for your next appointment:   In Person  Provider: Heart Failure TOC or DOD (Dr. Ellyn Hack is primary Cardiologist)  1}    Other Instructions Call 911 if pt develops symptoms discussed with Dr. Gasper Sells.

## 2021-07-26 NOTE — Progress Notes (Signed)
Cardiology Office Note:    Date:  07/27/2021   ID:  John Ball, DOB [birth year], MRN [id number]  PCP:  Ronnell Freshwater, NP  Cardiologist:  Donato Heinz, MD  Electrophysiologist:  None   Referring MD: Ronnell Freshwater, NP   Chief Complaint  Patient presents with   Congestive Heart Failure    History of Present Illness:    John Ball is a 81 y.o. male with a hx of CAD status post CABG, severe COPD, hypertension, PAD status post right femoropopliteal bypass, hypertension, DVT, hyperlipidemia, lung cancer who presents for follow-up.  He follows with Dr. Ellyn Hack.  Was seen by Dr. Gasper Sells on 1/10 for shortness of breath.  He was seen in the ED on 07/13/2021 with dyspnea.  BNP 1058.  He was treated with IV Lasix and steroids.  Admission was recommended but he refused.  At clinic visit on 1/10 admission was again recommended but he declined.  He was started on Lasix 40 mg twice daily and metolazone 2.5 mg twice weekly.  His wife states that he is only taking Lasix 40 mg once daily but did take metolazone 2.5 twice last week as prescribed.  Reports had significant diuresis, will weight is down 15 pounds from 1/10.  He reports his breathing is significantly improved.  Always has dyspnea but feels back to baseline. Denies any chest pain, dyspnea, lightheadedness, syncope, or palpitations.   Wt Readings from Last 3 Encounters:  07/27/21 162 lb 6.4 oz (73.7 kg)  07/17/21 177 lb 12.8 oz (80.6 kg)  07/16/21 178 lb 1.9 oz (80.8 kg)      Past Medical History:  Diagnosis Date   Anxiety    Arthritis    Complication of anesthesia    hx prolonged post op oxygen dependent/  hx hallucinations for 2 day post op lobectomy 2012   COPD, severe (HCC)    Depression    Essential hypertension    Hearing loss of both ears    does wear his hearing aids   History of arterial bypass of lower extremity    RIGHT FEM-POP   History of CHF (congestive heart failure)    History of DVT  of lower extremity    History of panic attacks    Hyperlipidemia with target LDL less than 70    Left main coronary artery disease 2004   a) Severe LM CAD 2004 -->s/p  cabg x4 (LIMA-LAD, free RIMA-RI, SVG-OM 2, SVG-RPDA); b) 06/2014: Abnormal Myoview --> c)cardiac Cath Jan 2016: pLM 70%, pLAD 40-50%- patent LIMA-LAD.  RI -competitive flow noted from RIMA-RI graft, circumflex, normal caliber with moderate OM1.  OM 2 occluded.  SVG-OM 2 patent. 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    does not wear his dentures   Nocturia    OSA (obstructive sleep apnea)    uses O2  via [place]  --  intolerate to CPAP   Peripheral arterial occlusive disease ([place])    2011   a) Gore-Tex graft right AK popA-BK popA --> b) 2012: thrombosed graft --> thrombectomy with dacron patch = 1 V runnof via Peroneal; c) 01/2014: R femoropopliteal bypass with left femoral vein;; followed by dr Early--  per last dopplers 06-28-2014 no change right graft,  50-74% stenosis common and left mid superficial femoral artery   PTSD (post-traumatic stress disorder)    anxiety attack's---  [place] Vet  Recurrent productive cough    SMOKER'S COUGH   S/P CABG x 4 07/2002   a) LIMA-LAD, freeRIMA-RI, SVG-OM2, SVG-RPDA ; --> December 2015 Myoview with mostly fixed inferior defect --> cardiac cath January 2016 revealed CTO of native RCA and SVG RCA.  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--- a) 04/2011: R L Lobectomy & med node dissection ([id number]) - [facility], [place]) w/o post-op Rx; b) CT-A Chest Jan 2013: R pl effusion, R hilar LAN (3.3 cm x 2.8 cm & 2.2 cm x 1.3 cm); c) 2/'13 PET-CT Chest: Bilat Hilar LAN, no distant Mets  d) CT chest 9/'14: Stable shotty hilar nodes bilaterally w/ no pathologic  sized LAD or suspicious Pulm nodule; ;;  (oncologist at   Wears glasses     Past Surgical History:  Procedure Laterality Date   ABDOMINAL AORTOGRAM N/A 11/12/2017   Procedure: ABDOMINAL AORTOGRAM;  Surgeon: Waynetta Sandy, MD;  Location: [facility];  Service: Cardiovascular;  Laterality: N/A;   ABDOMINAL AORTOGRAM W/LOWER EXTREMITY Left 02/10/2018   Procedure: ABDOMINAL AORTOGRAM W/LOWER EXTREMITY;  Surgeon: Serafina Mitchell, MD;  Location: [facility];  Service: Cardiovascular;  Laterality: Left;   APPLICATION OF WOUND VAC Left 12/03/2017   Procedure: EXCHANGE OF WOUND VAC LEFT GROIN,;  Surgeon: Rosetta Posner, MD;  Location: [facility];  Service: Vascular;  Laterality: Left;   CARDIAC CATHETERIZATION  07/13/2002   Ischemia RCA region on Cardiolite// 60-70% ostial left main, 50% midCx, 60-70% mid RI,  70% mRCA, 90% JeNu and crux 75% RCA ,  95% PLA//  severe LM and 3Vessel CAD, perserved LV, ef 60%   COLONOSCOPY  2008    WNL   CORONARY ARTERY BYPASS GRAFT  07/27/2002   LIMA-LAD, freeRIMA-RI,SVG-OM2, SVG-rPDA; Dr. Roxan Hockey   CYSTOSCOPY W/ URETERAL STENT PLACEMENT N/A 02/21/2015   Procedure: BILATERAL RETROGRADE PYELOGRAM;  Surgeon: Festus Aloe, MD;  Location: [facility];  Service: Urology;  Laterality: N/A;   ENDARTERECTOMY FEMORAL Left 11/17/2017   Procedure: LEFT Femoral Endarterectomy with Patch Angioplasty;  Surgeon: Rosetta Posner, MD;  Location: [facility];  Service: Vascular;  Laterality: Left;   EYE SURGERY Left 05/2012   FEMORAL ARTERY - POPLITEAL ARTERY BYPASS GRAFT Right 10-24-2008   dr early   w/ GoreTex graft   FEMORAL-POPLITEAL BYPASS GRAFT Left 02/11/2018   Procedure: BYPASS GRAFT LOWER EXTREMITY;  Surgeon: Rosetta Posner, MD;  Location: [facility];  Service: Vascular;  Laterality: Left;   FEMORAL-TIBIAL BYPASS GRAFT Right 01/10/2014   Procedure: Right Femoral to Posterior Tibial Bypass Graft using Left Leg Vein, Thrombectomy Right Common  Femoral and Profunda of Leg . ;  Surgeon: Rosetta Posner, MD;  Location: [facility];  Service: Vascular;  Laterality: Right;   HYDROCELE EXCISION Left    LEFT ANKLE SURGERY  1990's   LEFT HEART CATHETERIZATION WITH CORONARY/GRAFT ANGIOGRAM N/A 07/12/2014   Procedure: LEFT HEART CATHETERIZATION WITH Beatrix Fetters;  Surgeon: Troy Sine, MD;  Location: [facility];  Service: Cardiovascular;  Laterality: N/A;  pLM 70%, pLAD 40-50%- patent LIMA-LAD.  RI -competitive flow noted from fRIMA-RI graft, circumflex, normal caliber with moderate OM1.  OM 2 occluded.  SVG-OM 2 patent. RCA CTO after large RVM, faint R-R and LAD septal-PDA collateral   LOWER EXTREMITY  ANGIOGRAPHY Bilateral 11/12/2017   Procedure: LOWER EXTREMITY ANGIOGRAPHY - Left Lower;  Surgeon: Waynetta Sandy, MD;  Location: [place] [facility];  Service: Cardiovascular;  Laterality: Bilateral;   NM MYOVIEW LTD  11/'15; 3/'19, 5/'20   a) Mod sized, mod intensity/partially reversible inferior defect c/w  prior infarct mild/mod peri-infarct ischemia.  Inferoseptal HK.  INTERMEDIATE RISK. --> Cath showed CTO of Native RCA & SVG-rPDA.; b) EF ~42%. Inferior-Inferoseptal defect - likely old scar - INTERMEDIATE RISK b/c low EF (Echo EF 50-55%) - med Rx; c) [place] -by report - Non-ischemic.   PERIPHERAL VASCULAR INTERVENTION Left 02/10/2018   Procedure: PERIPHERAL VASCULAR INTERVENTION;  Surgeon: Serafina Mitchell, MD;  Location: [place];  Service: Cardiovascular;  Laterality: Left;  left common iliac stent, attempted left SFA stent   RIGHT ABOVE KNEE POPLITEAL GRAFT TO BELOW KNEE POPLITEAL ARTERY BYPASS WITH REVERSE SAPHENOUS VEIN  03/02/2010   THROMBECTOMY OF RIGHT FEMORAL TO ABOVE KNEE POPLITEAL GORETEX GRAFT ANGIOPLASTY OF GORETEX AND SAPHENOUS VEIN JUNCTION AND ABOVE KNEE POPLITEAL ARTERY   10-06-2010   DR EARLY   TONSILLECTOMY     TRANSTHORACIC ECHOCARDIOGRAM  09/2017   EF 50-55%.  More accurate - Mild  inferolateral HK (c/w known RCA occlusion).  GR 1 DD.  Mild LA dilation mildly increased PA pressures with mild RV/RA dilation.   TRANSURETHRAL RESECTION OF BLADDER TUMOR N/A 02/21/2015   Procedure: TRANSURETHRAL RESECTION OF BLADDER TUMOR (TURBT);  Surgeon: Festus Aloe, MD;  Location: [facility];  Service: Urology;  Laterality: N/A;   VIDEO ASSISTED THORACOSCOPY (VATS)/WEDGE RESECTION Right 05-01-2011    FORSYTH   LOWER LOBECTOMY W/  NODE DISSECTION   WOUND EXPLORATION Left 11/28/2017   Procedure: WOUND EXPLORATION GROIN, WASHOUT AND WOUND VAC APPLICATION;  Surgeon: Conrad Castalia, MD;  Location: [facility];  Service: Vascular;  Laterality: Left;    Current Medications: Current Meds  Medication Sig   acetaminophen (TYLENOL) 500 MG tablet Take 1,000 mg by mouth every 6 (six) hours as needed for moderate pain or headache.    ADVAIR DISKUS 500-50 MCG/DOSE AEPB Inhale 1 puff into the lungs 2 (two) times daily.    albuterol (PROVENTIL HFA;VENTOLIN HFA) 108 (90 BASE) MCG/ACT inhaler Inhale 1 puff into the lungs every 6 (six) hours as needed for wheezing or shortness of breath.   albuterol (PROVENTIL) (2.5 MG/3ML) 0.083% nebulizer solution Take 2.5 mg by nebulization every 6 (six) hours as needed for wheezing or shortness of breath.   aspirin EC 81 MG tablet Take 1 tablet (81 mg total) by mouth daily. Swallow whole.   cholecalciferol (VITAMIN D) 25 MCG (1000 UNIT) tablet Take 1,000 Units by mouth daily.   CYMBALTA 60 MG capsule Take 60 mg by mouth daily.   nitroGLYCERIN (NITROSTAT) 0.4 MG SL tablet Place 1 tablet (0.4 mg total) under the tongue every 5 (five) minutes as needed for chest pain.   OXYGEN Inhale 4 L into the lungs See admin instructions. Uses 4L at bedtime and if needed throughout the day -- intolerant to CPAP   prazosin (MINIPRESS) 1 MG capsule Take 1 mg by mouth at bedtime.   QUEtiapine (SEROQUEL XR) 300 MG 24 hr tablet Take 150 mg by mouth at bedtime.   ramipril  (ALTACE) 2.5 MG capsule Take 2.5 mg by mouth every morning.    tiotropium (SPIRIVA) 18 MCG inhalation capsule Place 18 mcg into inhaler and inhale daily.   traZODone (DESYREL) 100 MG tablet Take 100 mg by  mouth at bedtime.   [DISCONTINUED] furosemide (LASIX) 40 MG tablet Take 1 tablet (40 mg total) by mouth 2 (two) times daily. (Patient taking differently: Take 40 mg by mouth daily.)     Allergies:   Aspirin, Crestor [rosuvastatin calcium], Lipitor [atorvastatin calcium], Zetia [ezetimibe], Zocor [simvastatin - high dose], Aripiprazole, Effexor [venlafaxine hydrochloride], and Morphine and related   Social History   Socioeconomic History   Marital status: Married    Spouse name: Not on file   Number of children: 2   Years of education: Not on file   Highest education level: Not on file  Occupational History   Not on file  Tobacco Use   Smoking status: Every Day    Packs/day: 1.00    Years: 60.00    Pack years: 60.00    Types: Cigarettes    Start date: 09/14/1957   Smokeless tobacco: Never   Tobacco comments:    1/4 ppk 09/28/20  Vaping Use   Vaping Use: Never used  Substance and Sexual Activity   Alcohol use: No   Drug use: No   Sexual activity: Not on file  Other Topics Concern   Not on file  Social History Narrative   He is married with 2 children. Does not exercise regularly.   He and his wife have been recently living in [place], [place] (but just moved back to [place], [place] in November 2018).   He still smokes about a half-pack a day and is not interested in quitting. No alcohol or recreational drug use.      I confirmed with him and his wife that he does have a DNR order signed and displayed.      Social Determinants of Health   Financial Resource Strain: Not on file  Food Insecurity: Not on file  Transportation Needs: Not on file  Physical Activity: Not on file  Stress: Not on file  Social Connections: Not on file     Family History: The patient's family  history includes Alzheimer's disease (age of onset: 63) in his father; Other (age of onset: 4) in his mother; Stroke in his father and mother.  ROS:   Please see the history of present illness.     All other systems reviewed and are negative.  EKGs/Labs/Other Studies Reviewed:    The following studies were reviewed today:   EKG:  EKG is not ordered today.  Recent Labs: 07/13/2021: ALT 19; B Natriuretic Peptide 1,058.3; BUN 23; Creatinine, Ser 1.28; Hemoglobin 14.6; Platelets PLATELET CLUMPS NOTED ON SMEAR, UNABLE TO ESTIMATE; Potassium 3.7; Sodium 137  Recent Lipid Panel    Component Value Date/Time   CHOL 166 03/24/2019 0831   TRIG 85 03/24/2019 0831   HDL 45 03/24/2019 0831   CHOLHDL 3.8 09/25/2017 1420   CHOLHDL 4.9 07/22/2016 0456   VLDL 16 07/22/2016 0456   LDLCALC 105 (H) 03/24/2019 0831    Physical Exam:    VS:  BP 100/61    Pulse 78    Ht 6' (1.829 m)    Wt 162 lb 6.4 oz (73.7 kg)    SpO2 93%    BMI 22.03 kg/m     Wt Readings from Last 3 Encounters:  07/27/21 162 lb 6.4 oz (73.7 kg)  07/17/21 177 lb 12.8 oz (80.6 kg)  07/16/21 178 lb 1.9 oz (80.8 kg)     GEN:  in no acute distress HEENT: Normal NECK: No JVD LYMPHATICS: No lymphadenopathy CARDIAC: irregular, normal rate, no murmurs, rubs, gallops  RESPIRATORY:  Clear to auscultation without rales, wheezing or rhonchi  ABDOMEN: Soft, non-tender, non-distended MUSCULOSKELETAL: Trace edema; No deformity  SKIN: Warm and dry NEUROLOGIC:  Alert and oriented x 3 PSYCHIATRIC:  Normal affect   ASSESSMENT:    1. Acute on chronic diastolic (congestive) heart failure ([place])   2. Acute on chronic respiratory failure with hypoxia (HCC)   3. Coronary artery disease involving native coronary artery of native heart without angina pectoris   4. Essential hypertension    PLAN:     Acute on chronic respiratory failure with hypoxia: Likely multifactorial with heart failure and COPD contributing.  Most recent echo in March  2019 with EF 50 to 55%.  He has improved significantly with p.o. diuresis, weight is down 15 pounds and feels back to baseline respiratory status -Echocardiogram -P.o. Lasix 40 mg daily -Check BMET, magnesium  Acute on chronic diastolic heart failure: Significantly improved with diuresis as above, continue Lasix 40 mg daily.  Will check echo  CAD status post CABG: Intolerant to statins.  Previously had declined aspirin due to bruising but willing to try 81 mg daily  Hypertension: On ramipril.  Appears controlled   RTC in 6 weeks   Medication Adjustments/Labs and Tests Ordered: Current medicines are reviewed at length with the patient today.  Concerns regarding medicines are outlined above.  Orders Placed This Encounter  Procedures   Basic metabolic panel   Magnesium   ECHOCARDIOGRAM COMPLETE   Meds ordered this encounter  Medications   furosemide (LASIX) 40 MG tablet    Sig: Take 1 tablet (40 mg total) by mouth daily.    Dispense:  90 tablet    Refill:  1   aspirin EC 81 MG tablet    Sig: Take 1 tablet (81 mg total) by mouth daily. Swallow whole.    Dispense:  90 tablet    Refill:  3    Patient Instructions  Medication Instructions:  Decrease Lasix to 40 mg once daily  Take a baby aspirin 81 mg daily   *If you need a refill on your cardiac medications before your next appointment, please call your pharmacy*   Lab Work: BMET, MAG today   If you have labs (blood work) drawn today and your tests are completely normal, you will receive your results only by: [place] (if you have MyChart) OR A paper copy in the mail If you have any lab test that is abnormal or we need to change your treatment, we will call you to review the results.   Testing/Procedures: Echocardiogram - Your physician has requested that you have an echocardiogram. Echocardiography is a painless test that uses sound waves to create images of your heart. It provides your doctor with information  about the size and shape of your heart and how well your hearts chambers and valves are working. This procedure takes approximately one hour. There are no restrictions for this procedure. This will be performed at either our [facility] location - [address], [place] location [facility] 2nd floor.    Follow-Up: At [facility], you and your health needs are our priority.  As part of our continuing mission to provide you with exceptional heart care, we have created designated Provider Care Teams.  These Care Teams include your primary Cardiologist (physician) and Advanced Practice Providers (APPs -  Physician Assistants and Nurse Practitioners) who all work together to provide you with the care you need, when you need  it.  We recommend signing up for the patient portal called "MyChart".  Sign up information is provided on this After Visit Summary.  MyChart is used to connect with patients for Virtual Visits (Telemedicine).  Patients are able to view lab/test results, encounter notes, upcoming appointments, etc.  Non-urgent messages can be sent to your provider as well.   To learn more about what you can do with MyChart, go to [email or web address].    Your next appointment:   6 week(s)  The format for your next appointment:   In Person  Provider:   Any APP, or Dr.Harding     Signed, Donato Heinz, MD  07/27/2021 10:59 AM    [place]

## 2021-07-27 ENCOUNTER — Other Ambulatory Visit: Payer: Self-pay

## 2021-07-27 ENCOUNTER — Ambulatory Visit (INDEPENDENT_AMBULATORY_CARE_PROVIDER_SITE_OTHER): Payer: Medicare Other | Admitting: Cardiology

## 2021-07-27 ENCOUNTER — Encounter: Payer: Self-pay | Admitting: Cardiology

## 2021-07-27 VITALS — BP 100/61 | HR 78 | Ht 72.0 in | Wt 162.4 lb

## 2021-07-27 DIAGNOSIS — I5033 Acute on chronic diastolic (congestive) heart failure: Secondary | ICD-10-CM

## 2021-07-27 DIAGNOSIS — I2510 Atherosclerotic heart disease of native coronary artery without angina pectoris: Secondary | ICD-10-CM

## 2021-07-27 DIAGNOSIS — I10 Essential (primary) hypertension: Secondary | ICD-10-CM | POA: Diagnosis not present

## 2021-07-27 DIAGNOSIS — J9621 Acute and chronic respiratory failure with hypoxia: Secondary | ICD-10-CM

## 2021-07-27 DIAGNOSIS — I5032 Chronic diastolic (congestive) heart failure: Secondary | ICD-10-CM | POA: Diagnosis not present

## 2021-07-27 LAB — BASIC METABOLIC PANEL
BUN/Creatinine Ratio: 25 — ABNORMAL HIGH (ref 10–24)
BUN: 30 mg/dL — ABNORMAL HIGH (ref 8–27)
CO2: 29 mmol/L (ref 20–29)
Calcium: 9.7 mg/dL (ref 8.6–10.2)
Chloride: 97 mmol/L (ref 96–106)
Creatinine, Ser: 1.18 mg/dL (ref 0.76–1.27)
Glucose: 88 mg/dL (ref 70–99)
Potassium: 4.6 mmol/L (ref 3.5–5.2)
Sodium: 139 mmol/L (ref 134–144)
eGFR: 63 mL/min/{1.73_m2} (ref >59)

## 2021-07-27 LAB — MAGNESIUM: Magnesium: 2.2 mg/dL (ref 1.6–2.3)

## 2021-07-27 MED ORDER — FUROSEMIDE 40 MG PO TABS: 40.0000 mg | ORAL_TABLET | Freq: Every day | ORAL | 1 refills | Status: DC

## 2021-07-27 MED ORDER — ASPIRIN EC 81 MG PO TBEC: 81.0000 mg | DELAYED_RELEASE_TABLET | Freq: Every day | ORAL | 3 refills | Status: DC

## 2021-07-27 NOTE — Patient Instructions (Addendum)
Medication Instructions:  Decrease Lasix to 40 mg once daily  Take a baby aspirin 81 mg daily   *If you need a refill on your cardiac medications before your next appointment, please call your pharmacy*   Lab Work: BMET, MAG today   If you have labs (blood work) drawn today and your tests are completely normal, you will receive your results only by: [place] (if you have MyChart) OR A paper copy in the mail If you have any lab test that is abnormal or we need to change your treatment, we will call you to review the results.   Testing/Procedures: Echocardiogram - Your physician has requested that you have an echocardiogram. Echocardiography is a painless test that uses sound waves to create images of your heart. It provides your doctor with information about the size and shape of your heart and how well your hearts chambers and valves are working. This procedure takes approximately one hour. There are no restrictions for this procedure. This will be performed at either our [facility] location - [address] [place] location [facility] 2nd floor.    Follow-Up: At [facility], you and your health needs are our priority.  As part of our continuing mission to provide you with exceptional heart care, we have created designated Provider Care Teams.  These Care Teams include your primary Cardiologist (physician) and Advanced Practice Providers (APPs -  Physician Assistants and Nurse Practitioners) who all work together to provide you with the care you need, when you need it.  We recommend signing up for the patient portal called "MyChart".  Sign up information is provided on this After Visit Summary.  MyChart is used to connect with patients for Virtual Visits (Telemedicine).  Patients are able to view lab/test results, encounter notes, upcoming appointments, etc.  Non-urgent messages can be sent to your provider as well.   To learn more about what you  can do with MyChart, go to [email or web address].    Your next appointment:   6 week(s)  The format for your next appointment:   In Person  Provider:   Any APP, or Dr.Harding

## 2021-07-30 ENCOUNTER — Other Ambulatory Visit: Payer: Self-pay

## 2021-07-30 ENCOUNTER — Ambulatory Visit (HOSPITAL_COMMUNITY)
Admission: RE | Admit: 2021-07-30 | Discharge: 2021-07-30 | Disposition: A | Discharge status: Home / Self Care | Payer: Medicare Other | Source: Ambulatory Visit | Attending: Pulmonary Disease | Admitting: Pulmonary Disease

## 2021-07-30 DIAGNOSIS — I700 Atherosclerosis of aorta: Secondary | ICD-10-CM | POA: Diagnosis not present

## 2021-07-30 DIAGNOSIS — R918 Other nonspecific abnormal finding of lung field: Secondary | ICD-10-CM | POA: Diagnosis not present

## 2021-07-30 DIAGNOSIS — R911 Solitary pulmonary nodule: Secondary | ICD-10-CM | POA: Insufficient documentation

## 2021-07-30 DIAGNOSIS — J90 Pleural effusion, not elsewhere classified: Secondary | ICD-10-CM | POA: Diagnosis not present

## 2021-07-30 DIAGNOSIS — J439 Emphysema, unspecified: Secondary | ICD-10-CM | POA: Diagnosis not present

## 2021-08-07 ENCOUNTER — Ambulatory Visit (INDEPENDENT_AMBULATORY_CARE_PROVIDER_SITE_OTHER): Payer: Medicare Other | Admitting: Pulmonary Disease

## 2021-08-07 ENCOUNTER — Other Ambulatory Visit: Payer: Self-pay

## 2021-08-07 ENCOUNTER — Encounter: Payer: Self-pay | Admitting: Pulmonary Disease

## 2021-08-07 VITALS — BP 100/60 | HR 68 | Temp 97.5°F | Ht 72.0 in | Wt 167.6 lb

## 2021-08-07 DIAGNOSIS — J449 Chronic obstructive pulmonary disease, unspecified: Secondary | ICD-10-CM

## 2021-08-07 DIAGNOSIS — J432 Centrilobular emphysema: Secondary | ICD-10-CM | POA: Diagnosis not present

## 2021-08-07 DIAGNOSIS — Z720 Tobacco use: Secondary | ICD-10-CM

## 2021-08-07 DIAGNOSIS — I2510 Atherosclerotic heart disease of native coronary artery without angina pectoris: Secondary | ICD-10-CM

## 2021-08-07 DIAGNOSIS — R911 Solitary pulmonary nodule: Secondary | ICD-10-CM | POA: Diagnosis not present

## 2021-08-07 DIAGNOSIS — J9611 Chronic respiratory failure with hypoxia: Secondary | ICD-10-CM

## 2021-08-07 NOTE — Patient Instructions (Signed)
Will schedule CT chest in 4 months  Follow up in 4 months

## 2021-08-07 NOTE — Progress Notes (Signed)
[facility]  Chief Complaint  Patient presents with   Follow-up    CT scan    Constitutional:  BP 100/60 (BP Location: Left Arm, Cuff Size: Normal)    Pulse 68    Temp (!) 97.5 F (36.4 C) (Oral)    Ht 6' (1.829 m)    Wt 167 lb 9.6 oz (76 kg)    SpO2 91% Comment: 4L   BMI 22.73 kg/m   Past Medical History:  CAD s/p CABG, PTSD, PAD, OSA intolerant of CPAP, Neuropathy, HLD, DVT, diastolic CHF, HTN, Depression, OA, Anxiety  Past Surgical History:  He  has a past surgical history that includes Femoral-tibial Bypass Graft (Right, 01/10/2014); left heart catheterization with coronary/graft angiogram (N/A, 07/12/2014); Coronary artery bypass graft (07/27/2002); Tonsillectomy; Femoral artery - popliteal artery bypass graft (Right, 10-24-2008   dr early); RIGHT ABOVE KNEE POPLITEAL GRAFT TO BELOW KNEE POPLITEAL ARTERY BYPASS WITH REVERSE SAPHENOUS VEIN (03/02/2010); THROMBECTOMY OF RIGHT FEMORAL TO ABOVE KNEE POPLITEAL GORETEX GRAFT ANGIOPLASTY OF GORETEX AND SAPHENOUS VEIN JUNCTION AND ABOVE KNEE POPLITEAL ARTERY  (10-06-2010   DR EARLY); Video assisted thoracoscopy (vats)/wedge resection (Right, 05-01-2011    FORSYTH); Cardiac catheterization (07/13/2002); LEFT ANKLE SURGERY (1990's); Hydrocele surgery (Left); Eye surgery (Left, 05/2012); Colonoscopy (2008    WNL); Transurethral resection of bladder tumor (N/A, 02/21/2015); Cystoscopy w/ ureteral stent placement (N/A, 02/21/2015); NM MYOVIEW LTD (11/'15; 3/'19, 5/'20); Lower Extremity Angiography (Bilateral, 11/12/2017); ABDOMINAL AORTOGRAM (N/A, 11/12/2017); Endarterectomy femoral (Left, 11/17/2017); Wound exploration (Left, 11/28/2017); Application if wound vac (Left, 12/03/2017); ABDOMINAL AORTOGRAM W/LOWER EXTREMITY (Left, 02/10/2018); PERIPHERAL VASCULAR INTERVENTION (Left, 02/10/2018); Femoral-popliteal Bypass Graft (Left, 02/11/2018); and transthoracic echocardiogram (09/2017).  Brief Summary:  John Ball is a 81 y.o. male smoker with COPD with emphysema and chronic bronchitis, and chronic respiratory failure with hypoxia.  He has hx of NSCLC s/p Rt lower lobectomy.      Subjective:   He is here with his wife.  CT chest from 07/30/21 showed new nodule in LLL.  Otherwise stable changes of emphysema and atherosclerosis.  He was in the ER recently with CHF exacerbation.  Had follow up with cardiology and has Echo scheduled.  Has cough with clear sputum.  Down to 1/4 ppd of cigarettes.  Using 4 liters oxygen.  Leg swelling better since he was in ER.  Physical Exam:   Appearance - thin, sitting in wheel chair, wearing oxygen  ENMT - no sinus tenderness, no oral exudate, no LAN, Mallampati 3 airway, no stridor  Respiratory - coarse breath sounds b/l that clear with cough, no wheeze  CV - s1s2 regular rate and rhythm, no murmurs  Ext - 1+ lower leg edema  Skin - no rashes  Psych - normal mood and affect    Pulmonary testing:  Spirometry 03/05/16 >> FEV1 1.21 (36%), FEV1% 54% ABG 03/07/16 >> pH 7.40, PaCO2 42.6, PaO2 59.3  Chest Imaging:  CT chest 04/20/15 >> atherosclerosis, mild centrilobular/paraseptal emphysema, s/p RLL ectomy CT angio chest 09/12/17 >> thickened Rt diaphragm, s/p RLLectomy, tree in bud CT chest 08/19/18 >> centrilobular/paraseptal emphysema, volume loss Rt hemithorax, decreased LAN CT chest 07/28/19 >> stable 4 mm RML nodule, new 3 mm nodule in lingula CT chest 07/21/20 >> stable CT chest 07/30/21 >> prior RLL ectomy, severe centrilobular emphysema, new nodule LLL 5 mm, stable nodule RML 4 mm  Cardiac Tests:  Echo 09/26/17 >> EF 50 to 55%, grade 1 DD, PAS 36 mmHg  Social History:  He  reports that he has been smoking cigarettes. He started smoking about 63 years ago. He has a 60.00 pack-year smoking history. He has never used smokeless tobacco. He reports that he does not drink alcohol and does not use drugs.  Family History:  His family history  includes Alzheimer's disease (age of onset: 86) in his father; Other (age of onset: 73) in his mother; Stroke in his father and mother.     Assessment/Plan:   COPD with emphysema and chronic bronchitis. - continue advair 250 bid, spiriva daily - prn albuterol - if symptoms progress, then might need to switch to nebulizer maintenance therapy   Chronic respiratory failure with hypoxia. - he has PaO2 < 60 and chronic leg edema in setting of COPD - continue 4 liters oxygen 24/7   Lung nodules. - new 5 mm nodule noted in Lt lower lobe - he is high risk for development of lung cancer - will schedule follow up CT chest w/o contrast for May 2023   Tobacco abuse. - encouraged him to keep up with his smoking cessation efforts  Time Spent Involved in Patient Care on Day of Examination:  37 minutes  Follow up:   Patient Instructions  Will schedule CT chest in 4 months  Follow up in 4 months  Medication List:   Allergies as of 08/07/2021       Reactions   Aspirin Other (See Comments)   Avoids due to severe bruises   Crestor [rosuvastatin Calcium] Other (See Comments)   myalgia   Lipitor [atorvastatin Calcium] Other (See Comments)   myalgia   Zetia [ezetimibe] Other (See Comments)   myalgia   Zocor [simvastatin - High Dose] Other (See Comments)   myalgia   Aripiprazole Other (See Comments)   Altered mental status   Effexor [venlafaxine Hydrochloride] Itching   Morphine And Related Itching        Medication List        Accurate as of August 07, 2021 11:55 AM. If you have any questions, ask your nurse or doctor.          acetaminophen 500 MG tablet Commonly known as: TYLENOL Take 1,000 mg by mouth every 6 (six) hours as needed for moderate pain or headache.   Advair Diskus 500-50 MCG/ACT Aepb Generic drug: fluticasone-salmeterol Inhale 1 puff into the lungs 2 (two) times daily.   albuterol 108 (90 Base) MCG/ACT inhaler Commonly known as: VENTOLIN HFA Inhale  1 puff into the lungs every 6 (six) hours as needed for wheezing or shortness of breath.   albuterol (2.5 MG/3ML) 0.083% nebulizer solution Commonly known as: PROVENTIL Take 2.5 mg by nebulization every 6 (six) hours as needed for wheezing or shortness of breath.   aspirin EC 81 MG tablet Take 1 tablet (81 mg total) by mouth daily. Swallow whole.   cholecalciferol 25 MCG (1000 UNIT) tablet Commonly known as: VITAMIN D Take 1,000 Units by mouth daily.   Cymbalta 60 MG capsule Generic drug: DULoxetine Take 60 mg by mouth daily.   furosemide 40 MG tablet Commonly known as: LASIX Take 1 tablet (40 mg total) by mouth daily.   methylPREDNISolone 4 MG Tbpk tablet Commonly known as: MEDROL DOSEPAK Take as directed   metolazone 2.5 MG tablet Commonly known as: ZAROXOLYN Take 1 tablet on Wednesday 07/18/21 and Friday 07/20/21   nitroGLYCERIN 0.4 MG SL tablet Commonly known as: NITROSTAT Place 1 tablet (0.4 mg total) under the tongue every 5 (five) minutes as needed for  chest pain.   OXYGEN Inhale 4 L into the lungs See admin instructions. Uses 4L at bedtime and if needed throughout the day -- intolerant to CPAP   prazosin 1 MG capsule Commonly known as: MINIPRESS Take 1 mg by mouth at bedtime.   QUEtiapine 300 MG 24 hr tablet Commonly known as: SEROQUEL XR Take 150 mg by mouth at bedtime.   ramipril 2.5 MG capsule Commonly known as: ALTACE Take 2.5 mg by mouth every morning.   tiotropium 18 MCG inhalation capsule Commonly known as: SPIRIVA Place 18 mcg into inhaler and inhale daily.   traZODone 100 MG tablet Commonly known as: DESYREL Take 100 mg by mouth at bedtime.        Signature:  Chesley Mires, MD [place] Pager - [phone] 08/07/2021, 11:55 AM

## 2021-08-09 ENCOUNTER — Ambulatory Visit (HOSPITAL_COMMUNITY): Payer: Medicare Other | Attending: Internal Medicine

## 2021-08-09 ENCOUNTER — Other Ambulatory Visit: Payer: Self-pay

## 2021-08-09 DIAGNOSIS — I5033 Acute on chronic diastolic (congestive) heart failure: Secondary | ICD-10-CM | POA: Diagnosis not present

## 2021-08-09 LAB — ECHOCARDIOGRAM COMPLETE
Area-P 1/2: 4.08 cm2
MV M vel: 5.15 m/s
MV Peak grad: 105.9 mmHg
Radius: 0.8 cm
S' Lateral: 5 cm

## 2021-08-15 ENCOUNTER — Other Ambulatory Visit: Payer: Self-pay

## 2021-08-15 DIAGNOSIS — R7309 Other abnormal glucose: Secondary | ICD-10-CM

## 2021-08-15 DIAGNOSIS — Z1321 Encounter for screening for nutritional disorder: Secondary | ICD-10-CM

## 2021-08-15 DIAGNOSIS — Z0000 Encounter for general adult medical examination without abnormal findings: Secondary | ICD-10-CM

## 2021-08-15 DIAGNOSIS — Z130 Encounter for screening for diseases of the blood and blood-forming organs and certain disorders involving the immune mechanism: Secondary | ICD-10-CM

## 2021-08-20 ENCOUNTER — Other Ambulatory Visit: Payer: Self-pay | Admitting: Nurse Practitioner

## 2021-08-20 ENCOUNTER — Other Ambulatory Visit: Payer: Medicare Other

## 2021-08-20 ENCOUNTER — Other Ambulatory Visit: Payer: Self-pay

## 2021-08-20 DIAGNOSIS — Z0000 Encounter for general adult medical examination without abnormal findings: Secondary | ICD-10-CM

## 2021-08-20 DIAGNOSIS — Z130 Encounter for screening for diseases of the blood and blood-forming organs and certain disorders involving the immune mechanism: Secondary | ICD-10-CM

## 2021-08-20 DIAGNOSIS — Z1329 Encounter for screening for other suspected endocrine disorder: Secondary | ICD-10-CM | POA: Diagnosis not present

## 2021-08-20 DIAGNOSIS — Z13228 Encounter for screening for other metabolic disorders: Secondary | ICD-10-CM | POA: Diagnosis not present

## 2021-08-20 DIAGNOSIS — R7309 Other abnormal glucose: Secondary | ICD-10-CM | POA: Diagnosis not present

## 2021-08-20 DIAGNOSIS — Z1321 Encounter for screening for nutritional disorder: Secondary | ICD-10-CM | POA: Diagnosis not present

## 2021-08-27 ENCOUNTER — Ambulatory Visit (INDEPENDENT_AMBULATORY_CARE_PROVIDER_SITE_OTHER): Payer: Medicare Other | Admitting: Nurse Practitioner

## 2021-08-27 ENCOUNTER — Other Ambulatory Visit: Payer: Self-pay

## 2021-08-27 ENCOUNTER — Encounter: Payer: Self-pay | Admitting: Nurse Practitioner

## 2021-08-27 VITALS — BP 104/58 | HR 96 | Temp 97.4°F | Ht 72.0 in | Wt 167.1 lb

## 2021-08-27 DIAGNOSIS — J449 Chronic obstructive pulmonary disease, unspecified: Secondary | ICD-10-CM

## 2021-08-27 DIAGNOSIS — R0602 Shortness of breath: Secondary | ICD-10-CM

## 2021-08-27 DIAGNOSIS — Z0000 Encounter for general adult medical examination without abnormal findings: Secondary | ICD-10-CM

## 2021-08-27 DIAGNOSIS — I10 Essential (primary) hypertension: Secondary | ICD-10-CM | POA: Diagnosis not present

## 2021-08-27 DIAGNOSIS — Z9981 Dependence on supplemental oxygen: Secondary | ICD-10-CM

## 2021-08-27 DIAGNOSIS — I5032 Chronic diastolic (congestive) heart failure: Secondary | ICD-10-CM | POA: Diagnosis not present

## 2021-08-27 LAB — COMPREHENSIVE METABOLIC PANEL
ALT: 15 IU/L
AST: 16 IU/L (ref 0–40)
Albumin/Globulin Ratio: 2.1 (ref 1.2–2.2)
Albumin: 4.4 g/dL (ref 3.7–4.7)
Alkaline Phosphatase: 62 IU/L (ref 44–121)
BUN/Creatinine Ratio: 26
BUN: 24 mg/dL
Bilirubin Total: 0.6 mg/dL (ref 0.0–1.2)
CO2: 27 mmol/L (ref 20–29)
Calcium: 9.5 mg/dL
Chloride: 99 mmol/L (ref 96–106)
Creatinine, Ser: 0.92 mg/dL
Globulin, Total: 2.1 g/dL (ref 1.5–4.5)
Glucose: 102 mg/dL — ABNORMAL HIGH (ref 70–99)
Potassium: 4.6 mmol/L (ref 3.5–5.2)
Sodium: 140 mmol/L (ref 134–144)
Total Protein: 6.5 g/dL (ref 6.0–8.5)
eGFR: 64 mL/min/{1.73_m2} (ref >59)

## 2021-08-27 LAB — LIPID PANEL
Chol/HDL Ratio: 3.6 ratio
Cholesterol, Total: 190 mg/dL
HDL: 53 mg/dL (ref >39)
LDL Chol Calc (NIH): 118 mg/dL
Triglycerides: 107 mg/dL
VLDL Cholesterol Cal: 19 mg/dL (ref 5–40)

## 2021-08-27 LAB — CBC
Hematocrit: 41.1 %
Hemoglobin: 14.4 g/dL
MCH: 35.6 pg
MCHC: 35 g/dL
MCV: 102 fL
Platelets: 120 10*3/uL — ABNORMAL LOW (ref 150–450)
RBC: 4.04 x10E6/uL
RDW: 13.1 %
WBC: 3.3 10*3/uL — ABNORMAL LOW (ref 3.4–10.8)

## 2021-08-27 LAB — TSH: TSH: 3.21 u[IU]/mL (ref 0.450–4.500)

## 2021-08-27 LAB — HEMOGLOBIN A1C
Est. average glucose Bld gHb Est-mCnc: 126 mg/dL
Hgb A1c MFr Bld: 6 % — ABNORMAL HIGH (ref 4.8–5.6)

## 2021-08-27 NOTE — Progress Notes (Signed)
Patient sees oncology routinely. Review lab results with patient during [facility] 08/27/2021

## 2021-08-27 NOTE — Progress Notes (Signed)
Subjective:   John Ball is a 81 y.o. male who presents for Medicare Annual/Subsequent preventive examination. He is oxygen dependant. Currently having some fear of running out of oxygen during today's visit. He is mildly short of breath with some accessory muscle use. Oxygen saturation is 99%. -sees cardiology and oncology and pulmonology. -had routine, fasting labs done prior to today's visit.   -low WBC and platelet count - stable over many checks  -mild elevation of glucose at 102 with HgbA1c 6.0. this is elevated from recent check at 5.7   -normal thyroid -patient reports no new concerns or complaints today   Review of Systems    Review of Systems  Constitutional:  Negative for chills, fever, malaise/fatigue and weight loss.  HENT:  Negative for congestion, ear discharge, ear pain and sore throat.   Eyes: Negative.   Respiratory:  Positive for shortness of breath. Negative for cough and wheezing.   Cardiovascular:  Positive for orthopnea. Negative for chest pain, palpitations and claudication.  Gastrointestinal:  Negative for constipation, diarrhea, nausea and vomiting.  Genitourinary:  Negative for frequency and urgency.  Musculoskeletal:  Negative for joint pain and myalgias.  Skin:  Negative for itching and rash.  Neurological:  Negative for dizziness and headaches.  Endo/Heme/Allergies:  Negative for environmental allergies. Does not bruise/bleed easily.  Psychiatric/Behavioral:  Negative for depression, substance abuse and suicidal ideas. The patient is nervous/anxious.           Objective:    Today's Vitals   08/27/21 1002  BP: (!) 104/58  Pulse: 96  Temp: (!) 97.4 F (36.3 C)  SpO2: 99%  Weight: 167 lb 1.9 oz (75.8 kg)  Height: 6' (1.829 m)   Body mass index is 22.67 kg/m. Physical Exam Vitals and nursing note reviewed.  Constitutional:      Appearance: Normal appearance. He is well-developed.  HENT:     Head: Normocephalic and atraumatic.     Nose:  Nose normal.     Mouth/Throat:     Mouth: Mucous membranes are moist.     Pharynx: Oropharynx is clear.  Eyes:     Extraocular Movements: Extraocular movements intact.     Conjunctiva/sclera: Conjunctivae normal.     Pupils: Pupils are equal, round, and reactive to light.  Neck:     Vascular: Carotid bruit present.     Comments: Soft, bilateral carotid bruit auscultated today. Cardiovascular:     Rate and Rhythm: Normal rate and regular rhythm.     Pulses: Normal pulses.     Heart sounds: Normal heart sounds.  Pulmonary:     Effort: Pulmonary effort is normal.     Breath sounds: Normal breath sounds.     Comments: Patient using accessory muscles to aid in work of breathing.  Abdominal:     General: Bowel sounds are normal. There is no distension.     Palpations: Abdomen is soft. There is no mass.     Tenderness: There is no abdominal tenderness. There is no guarding or rebound.     Hernia: No hernia is present.  Musculoskeletal:        General: Normal range of motion.     Cervical back: Normal range of motion and neck supple.  Lymphadenopathy:     Cervical: No cervical adenopathy.  Skin:    General: Skin is warm and dry.     Capillary Refill: Capillary refill takes less than 2 seconds.  Neurological:     General: No focal deficit  present.     Mental Status: He is alert and oriented to person, place, and time.  Psychiatric:        Attention and Perception: Attention and perception normal.        Mood and Affect: Affect normal. Mood is anxious.        Speech: Speech normal.        Behavior: Behavior normal. Behavior is cooperative.        Thought Content: Thought content normal.        Cognition and Memory: Cognition and memory normal.        Judgment: Judgment normal.    Advanced Directives 02/09/2019 02/20/2018 02/10/2018 12/03/2017 11/28/2017 11/28/2017 11/17/2017  Does Patient Have a Medical Advance Directive? Yes Yes Yes Yes Yes No Yes  Type of Research officer, political party of [place];Living will [place];Living will [place];Living will Healthcare Power of [place] of [place] of [place]  Does patient want to make changes to medical advance directive? No - Patient declined - No - Patient declined No - Patient declined No - Patient declined No - Patient declined No - Patient declined  Copy of [place] in Chart? No - copy requested - No - copy requested - No - copy requested No - copy requested No - copy requested  Would patient like information on creating a medical advance directive? - - - - - - -  Pre-existing out of facility DNR order (yellow form or pink MOST form) - - - - - - -    Current Medications (verified) Outpatient Encounter Medications as of 08/27/2021  Medication Sig   acetaminophen (TYLENOL) 500 MG tablet Take 1,000 mg by mouth every 6 (six) hours as needed for moderate pain or headache.    ADVAIR DISKUS 500-50 MCG/DOSE AEPB Inhale 1 puff into the lungs 2 (two) times daily.    albuterol (PROVENTIL HFA;VENTOLIN HFA) 108 (90 BASE) MCG/ACT inhaler Inhale 1 puff into the lungs every 6 (six) hours as needed for wheezing or shortness of breath.   albuterol (PROVENTIL) (2.5 MG/3ML) 0.083% nebulizer solution Take 2.5 mg by nebulization every 6 (six) hours as needed for wheezing or shortness of breath.   aspirin EC 81 MG tablet Take 1 tablet (81 mg total) by mouth daily. Swallow whole.   cholecalciferol (VITAMIN D) 25 MCG (1000 UNIT) tablet Take 1,000 Units by mouth daily.   CYMBALTA 60 MG capsule Take 60 mg by mouth daily.   fluticasone-salmeterol (ADVAIR) 250-50 MCG/ACT AEPB INHALE 1 INHALATION BY MOUTH TWICE A DAY (RINSE MOUTH WELL WITH WATER AFTER EACH USE (CONVERTED FROM SYMBICORT. NOTE NEW DIRECTIONS)   furosemide (LASIX) 40 MG tablet Take 1 tablet (40 mg total) by mouth daily.   nitroGLYCERIN (NITROSTAT) 0.4 MG SL tablet Place  1 tablet (0.4 mg total) under the tongue every 5 (five) minutes as needed for chest pain.   OXYGEN Inhale 4 L into the lungs See admin instructions. Uses 4L at bedtime and if needed throughout the day -- intolerant to CPAP   prazosin (MINIPRESS) 1 MG capsule Take 1 mg by mouth at bedtime.   QUEtiapine (SEROQUEL XR) 300 MG 24 hr tablet Take 150 mg by mouth at bedtime.   ramipril (ALTACE) 2.5 MG capsule Take 2.5 mg by mouth every morning.    tiotropium (SPIRIVA) 18 MCG inhalation capsule Place 18 mcg into inhaler and inhale daily.   traZODone (DESYREL) 100 MG  tablet Take 100 mg by mouth at bedtime.   WIXELA INHUB 250-50 MCG/ACT AEPB Inhale 1 puff into the lungs 2 (two) times daily.   [DISCONTINUED] methylPREDNISolone (MEDROL DOSEPAK) 4 MG TBPK tablet Take as directed (Patient not taking: Reported on 07/27/2021)   [DISCONTINUED] metolazone (ZAROXOLYN) 2.5 MG tablet Take 1 tablet on Wednesday 07/18/21 and Friday 07/20/21 (Patient not taking: Reported on 07/27/2021)   No facility-administered encounter medications on file as of 08/27/2021.    Allergies (verified) Aspirin, Crestor [rosuvastatin calcium], Lipitor [atorvastatin calcium], Zetia [ezetimibe], Zocor [simvastatin - high dose], Aripiprazole, Atorvastatin, Morphine, Simvastatin, Aripiprazole, Effexor [venlafaxine hydrochloride], and Morphine and related   History: Past Medical History:  Diagnosis Date   Anxiety    Arthritis    Complication of anesthesia    hx prolonged post op oxygen dependent/  hx hallucinations for 2 day post op lobectomy 2012   COPD, severe ([place])    Depression    Essential hypertension    Hearing loss of both ears    does wear his hearing aids   History of arterial bypass of lower extremity    RIGHT FEM-POP   History of CHF (congestive heart failure)    History of DVT of lower extremity    History of panic attacks    Hyperlipidemia with target LDL less than 70    Left main coronary artery disease 2004   a) Severe  LM CAD 2004 -->s/p  cabg x4 (LIMA-LAD, free RIMA-RI, SVG-OM 2, SVG-RPDA); b) 06/2014: Abnormal Myoview --> c)cardiac Cath Jan 2016: pLM 70%, pLAD 40-50%- patent LIMA-LAD.  RI -competitive flow noted from RIMA-RI graft, circumflex, normal caliber with moderate OM1.  OM 2 occluded.  SVG-OM 2 patent. 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    does not wear his dentures   Nocturia    OSA (obstructive sleep apnea)    uses O2  via [place]  --  intolerate to CPAP   Peripheral arterial occlusive disease ([place])    2011   a) Gore-Tex graft right AK popA-BK popA --> b) 2012: thrombosed graft --> thrombectomy with dacron patch = 1 V runnof via Peroneal; c) 01/2014: R femoropopliteal bypass with left femoral vein;; followed by dr Early--  per last dopplers 06-28-2014 no change right graft,  50-74% stenosis common and left mid superficial femoral artery   PTSD (post-traumatic stress disorder)    anxiety attack's---  [place] V[facility]   Recurrent productive cough    SMOKER'S COUGH   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--- a) 04/2011: R L Lobectomy & med node dissection ([id number]) - [facility] [place], [place]) w/o post-op Rx; b) CT-A Chest Jan 2013: R pl effusion, R hilar LAN (3.3 cm x 2.8 cm & 2.2 cm x 1.3 cm); c) 2/'13 PET-CT Chest: Bilat Hilar LAN, no distant Mets  d) CT chest 9/'14: Stable shotty hilar nodes bilaterally w/ no pathologic sized LAD or suspicious Pulm nodule; ;;  (oncologist at   Wears glasses    Past  Surgical History:  Procedure Laterality Date   ABDOMINAL AORTOGRAM N/A  11/12/2017   Procedure: ABDOMINAL AORTOGRAM;  Surgeon: Waynetta Sandy, MD;  Location: [facility];  Service: Cardiovascular;  Laterality: N/A;   ABDOMINAL AORTOGRAM W/LOWER EXTREMITY Left 02/10/2018   Procedure: ABDOMINAL AORTOGRAM W/LOWER EXTREMITY;  Surgeon: Serafina Mitchell, MD;  Location: [facility];  Service: Cardiovascular;  Laterality: Left;   APPLICATION OF WOUND VAC Left 12/03/2017   Procedure: EXCHANGE OF WOUND VAC LEFT GROIN,;  Surgeon: Rosetta Posner, MD;  Location: [facility];  Service: Vascular;  Laterality: Left;   CARDIAC CATHETERIZATION  07/13/2002   Ischemia RCA region on Cardiolite// 60-70% ostial left main, 50% midCx, 60-70% mid RI,  70% mRCA, 90% JeNu and crux 75% RCA ,  95% PLA//  severe LM and 3Vessel CAD, perserved LV, ef 60%   COLONOSCOPY  2008    WNL   CORONARY ARTERY BYPASS GRAFT  07/27/2002   LIMA-LAD, freeRIMA-RI,SVG-OM2, SVG-rPDA; Dr. Roxan Hockey   CYSTOSCOPY W/ URETERAL STENT PLACEMENT N/A 02/21/2015   Procedure: BILATERAL RETROGRADE PYELOGRAM;  Surgeon: Festus Aloe, MD;  [facility];  Service: Urology;  Laterality: N/A;   ENDARTERECTOMY FEMORAL Left 11/17/2017   Procedure: LEFT Femoral Endarterectomy with Patch Angioplasty;  Surgeon: Rosetta Posner, MD;  Location: [facility];  Service: Vascular;  Laterality: Left;   EYE SURGERY Left 05/2012   FEMORAL ARTERY - POPLITEAL ARTERY BYPASS GRAFT Right 10-24-2008   dr early   w/ GoreTex graft   FEMORAL-POPLITEAL BYPASS GRAFT Left 02/11/2018   Procedure: BYPASS GRAFT LOWER EXTREMITY;  Surgeon: Rosetta Posner, MD;  Location: [facility];  Service: Vascular;  Laterality: Left;   FEMORAL-TIBIAL BYPASS GRAFT Right 01/10/2014   Procedure: Right Femoral to Posterior Tibial Bypass Graft using Left Leg Vein, Thrombectomy Right Common Femoral and Profunda of Leg . ;  Surgeon: Rosetta Posner, MD;  Location: [facility];  Service: Vascular;  Laterality: Right;   HYDROCELE EXCISION Left    LEFT ANKLE  SURGERY  1990's   LEFT HEART CATHETERIZATION WITH CORONARY/GRAFT ANGIOGRAM N/A 07/12/2014   Procedure: LEFT HEART CATHETERIZATION WITH Beatrix Fetters;  Surgeon: Troy Sine, MD;  [facility];  Service: Cardiovascular;  Laterality: N/A;  pLM 70%, pLAD 40-50%- patent LIMA-LAD.  RI -competitive flow noted from fRIMA-RI graft, circumflex, normal caliber with moderate OM1.  OM 2 occluded.  SVG-OM 2 patent. RCA CTO after large RVM, faint R-R and LAD septal-PDA collateral   LOWER EXTREMITY ANGIOGRAPHY Bilateral 11/12/2017   Procedure: LOWER EXTREMITY ANGIOGRAPHY - Left Lower;  Surgeon: Waynetta Sandy, MD;  Location: [facility];  Service: Cardiovascular;  Laterality: Bilateral;   NM MYOVIEW LTD  11/'15; 3/'19, 5/'20   a) Mod sized, mod intensity/partially reversible inferior defect c/w  prior infarct mild/mod peri-infarct ischemia.  Inferoseptal HK.  INTERMEDIATE RISK. --> Cath showed CTO of Native RCA & SVG-rPDA.; b) EF ~42%. Inferior-Inferoseptal defect - likely old scar - INTERMEDIATE RISK b/c low EF (Echo EF 50-55%) - med Rx; c) [place] -by report - Non-ischemic.   PERIPHERAL VASCULAR INTERVENTION Left 02/10/2018   Procedure: PERIPHERAL VASCULAR INTERVENTION;  Surgeon: Serafina Mitchell, MD;  Location: [facility];  Service: Cardiovascular;  Laterality: Left;  left common iliac stent, attempted left SFA stent   RIGHT ABOVE KNEE POPLITEAL GRAFT TO BELOW KNEE POPLITEAL ARTERY BYPASS WITH REVERSE SAPHENOUS VEIN  03/02/2010   THROMBECTOMY OF RIGHT FEMORAL TO ABOVE KNEE POPLITEAL GORETEX  GRAFT ANGIOPLASTY OF GORETEX AND SAPHENOUS VEIN JUNCTION AND ABOVE KNEE POPLITEAL ARTERY   10-06-2010   DR EARLY   TONSILLECTOMY     TRANSTHORACIC ECHOCARDIOGRAM  09/2017   EF 50-55%.  More accurate - Mild inferolateral HK (c/w known RCA occlusion).  GR 1 DD.  Mild LA dilation mildly increased PA pressures with mild RV/RA dilation.   TRANSURETHRAL RESECTION OF BLADDER  TUMOR N/A 02/21/2015   Procedure: TRANSURETHRAL RESECTION OF BLADDER TUMOR (TURBT);  Surgeon: Festus Aloe, MD;  Location: [facility];  Service: Urology;  Laterality: N/A;   VIDEO ASSISTED THORACOSCOPY (VATS)/WEDGE RESECTION Right 05-01-2011    FORSYTH   LOWER LOBECTOMY W/  NODE DISSECTION   WOUND EXPLORATION Left 11/28/2017   Procedure: WOUND EXPLORATION GROIN, WASHOUT AND WOUND VAC APPLICATION;  Surgeon: Conrad Oaklyn, MD;  Location: [facility];  Service: Vascular;  Laterality: Left;   Family History  Problem Relation Age of Onset   Other Mother 84       Abdominal Aortic Aneurysm    Stroke Mother    Alzheimer's disease Father 34   Stroke Father    Social History   Socioeconomic History   Marital status: Married    Spouse name: Not on file   Number of children: 2   Years of education: Not on file   Highest education level: Not on file  Occupational History   Not on file  Tobacco Use   Smoking status: Every Day    Packs/day: 1.00    Years: 60.00    Pack years: 60.00    Types: Cigarettes    Start date: 09/14/1957   Smokeless tobacco: Never   Tobacco comments:    1/4 ppk 09/28/20  Vaping Use   Vaping Use: Never used  Substance and Sexual Activity   Alcohol use: No   Drug use: No   Sexual activity: Not on file  Other Topics Concern   Not on file  Social History Narrative   He is married with 2 children. Does not exercise regularly.   He and his wife have been recently living in [place], [place] (but just moved back to [place], [place] in November 2018).   He still smokes about a half-pack a day and is not interested in quitting. No alcohol or recreational drug use.      I confirmed with him and his wife that he does have a DNR order signed and displayed.      Social Determinants of Health   Financial Resource Strain: Not on file  Food Insecurity: Not on file  Transportation Needs: Not on file  Physical Activity: Not on file  Stress: Not on file   Social Connections: Not on file    Tobacco Counseling Ready to quit: Not Answered Counseling given: Not Answered Tobacco comments: 1/4 ppk 09/28/20   Diabetic?no    Activities of Daily Living In your present state of health, do you have any difficulty performing the following activities: 08/27/2021 07/16/2021  Hearing? Tempie Donning  Vision? N N  Difficulty concentrating or making decisions? N N  Walking or climbing stairs? N Y  Dressing or bathing? N N  Doing errands, shopping? N N  Some recent data might be hidden    Patient Care Team: Ronnell Freshwater, NP as PCP - General (Family Medicine) Donato Heinz, MD as PCP - Cardiology (Cardiology)  Indicate any recent Medical Services you may have received from other than [facility] providers in the past year (date may  be approximate).     Assessment:  1. Encounter for Medicare annual wellness exam Ancounter for annual Medicare wellness visit   2. Essential hypertension Stable. Conitnue bp medications as prescribed   3. Chronic diastolic heart failure ([place]) Continue regular visits with cardiology as scheduled   4. Chronic obstructive pulmonary disease, unspecified COPD type ([place]) Currently stable. Continue regular visits with pulmonology as scheduled   5. SOB (shortness of breath) Stable. Continue use inhalers and respiratory medications as prescribed   6. Dependence on supplemental oxygen Use oxygen via nasal cannula as prescribed    Hearing/Vision screen No results found.   Depression Screen PHQ 2/9 Scores 08/27/2021 07/16/2021 03/17/2019 02/03/2019 12/07/2018 09/25/2017 05/07/2017  PHQ - 2 Score 0 0 0 4 - 6 0  PHQ- 9 Score 0 2 0 23 - 26 0  Exception Documentation - - - - Patient refusal - -    Fall Risk Fall Risk  08/27/2021 07/16/2021 03/17/2019 12/07/2018 09/25/2017  Falls in the past year? 0 0 0 0 No  Number falls in past yr: 0 0 0 - -  Injury with Fall? 0 0 - - -  Follow up Falls evaluation completed Falls evaluation  completed - - -    FALL RISK PREVENTION PERTAINING TO THE HOME:  Any stairs in or around the home? No  If so, are there any without handrails? No  Home free of loose throw rugs in walkways, pet beds, electrical cords, etc? Yes  Adequate lighting in your home to reduce risk of falls? Yes   ASSISTIVE DEVICES UTILIZED TO PREVENT FALLS:  Life alert? No  Use of a cane, walker or w/c? No  Grab bars in the bathroom? Yes  Shower chair or bench in shower? Yes  Elevated toilet seat or a handicapped toilet? Yes   TIMED UP AND GO:  Was the test performed? Yes .  Length of time to ambulate 10 feet: 15 sec.   Gait slow and steady with assistive device  Cognitive Function:     6CIT Screen 08/27/2021 03/17/2019  What Year? 0 points 0 points  What month? 0 points 0 points  What time? 0 points 0 points  Count back from 20 0 points 0 points  Months in reverse 0 points 0 points  Repeat phrase 2 points 0 points  Total Score 2 0    Immunizations Immunization History  Administered Date(s) Administered   Fluad Quad(high Dose 65+) 04/14/2019   Influenza Split 03/14/2017, 03/17/2018, 04/17/2020   Influenza, High Dose Seasonal PF 04/07/2014, 04/27/2015, 04/07/2016, 04/04/2017, 04/03/2018   Influenza,inj,Quad PF,6+ Mos 03/05/2016   Influenza-Unspecified 04/07/2012, 05/08/2013, 04/08/2019, 04/21/2020   Meningococcal Polysaccharide 03/12/2017   Moderna Sars-Covid-2 Vaccination 08/18/2019, 09/15/2019, 04/10/2020, 06/23/2020, 01/25/2021   Pneumococcal Conjugate-13 04/29/2014   Pneumococcal Polysaccharide-23 11/05/2009, 05/03/2010, 03/12/2017   Td 07/10/2018   Td (Adult) 07/09/2018   Tdap 04/07/2006, 10/30/2006   Zoster Recombinat (Shingrix) 01/27/2020, 05/08/2020    TDAP status: Up to date  Flu Vaccine status: Declined, Education has been provided regarding the importance of this vaccine but patient still declined. Advised may receive this vaccine at local pharmacy or Health Dept. Aware to  provide a copy of the vaccination record if obtained from local pharmacy or Health Dept. Verbalized acceptance and understanding.  Pneumococcal vaccine status: Up to date  Covid-19 vaccine status: Completed vaccines  Qualifies for Shingles Vaccine? Yes   Zostavax completed Yes   Shingrix Completed?: Yes  Screening Tests Health Maintenance  Topic Date Due  COVID-19 Vaccine (6 - Booster for Moderna series) 03/22/2021   INFLUENZA VACCINE  10/05/2021 (Originally 02/05/2021)   Hepatitis C Screening  08/27/2022 (Originally 02/08/1959)   TETANUS/TDAP  07/10/2028   Pneumonia Vaccine 47+ Years old  Completed   Zoster Vaccines- Shingrix  Completed   HPV VACCINES  Aged Out    Health Maintenance  Health Maintenance Due  Topic Date Due   COVID-19 Vaccine (6 - Booster for Moderna series) 03/22/2021    Colorectal cancer screening: Type of screening: Colonoscopy. Completed yes. Repeat every 0 years  Lung Cancer Screening: (Low Dose CT Chest recommended if Age 22-80 years, 30 pack-year currently smoking OR have quit w/in 15years.) does not qualify.   Lung Cancer Screening Referral: no  Additional Screening:  Hepatitis C Screening: does not qualify; Completed no  Vision Screening: Recommended annual ophthalmology exams for early detection of glaucoma and other disorders of the eye. Is the patient up to date with their annual eye exam?  Yes  Who is the provider or what is the name of the office in which the patient attends [facility] exams[facility] If pt is not established with a provider, would they like to be referred to a provider to establish care? No .   Dental Screening: Recommended annual dental exams for proper oral hygiene  Community Resource Referral / Chronic Care Management: CRR required this visit?  No   CCM required this visit?  No      Plan:     I have personally reviewed and noted the following in the patients chart:   Medical and social history Use of alcohol,  tobacco or illicit drugs  Current medications and supplements including opioid prescriptions. Patient is not currently taking opioid prescriptions. Functional ability and status Nutritional status Physical activity Advanced directives List of other physicians Hospitalizations, surgeries, and ER visits in previous 12 months Vitals Screenings to include cognitive, depression, and falls Referrals and appointments  In addition, I have reviewed and discussed with patient certain preventive protocols, quality metrics, and best practice recommendations. A written personalized care plan for preventive services as well as general preventive health recommendations were provided to patient.     Leretha Pol, FNP-c   08/27/2021

## 2021-09-05 NOTE — Progress Notes (Signed)
Cardiology Office Note:    Date:  09/06/2021   ID:  John Ball, DOB December 10, 1940, MRN [id number]  PCP:  Ronnell Freshwater, NP [place] Cardiologist: Donato Heinz, MD / Dr. Ellyn Hack  Reason for visit: 6 week follow-up  History of Present Illness:    MALIIK Ball is a 81 y.o. male with a hx of CAD status post CABG, severe COPD, hypertension, PAD status post right femoropopliteal bypass, hypertension, DVT, hyperlipidemia, lung cancer.  Was seen by Dr. Gasper Sells on 1/10 for shortness of breath.  He was seen in the ED on 07/13/2021 with dyspnea.  BNP 1058.  He was treated with IV Lasix and steroids.  Admission was recommended but he refused.  At clinic visit on 1/10 admission was again recommended but he declined.  He was started on Lasix 40 mg twice daily and metolazone 2.5 mg twice weekly.  Patient is taking Lasix 40 mg once a day and metolazone 2.5 mg twice a week.  He had significant diuresis with weight loss of 15 pounds and shortness of breath improved/back to baseline.  He saw Dr. Gardiner Rhyme on January 20.  He was continued on Lasix 40 mg daily an echo was ordered.   2D echo in February 2023 showed EF 20-25%, global hypokinesis, grade 2 diastolic dysfunction severely reduced RV systolic function, mildly enlarged RV, moderately elevated pulmonary artery pressures (RVSP 59 mmHg), severely dilated left atria, moderate mitral regurgitation, moderate tricuspid rotation, mild AI, mild dilation of the ascending aorta at 43 mm.  Today, he comes with his wife who speaks mostly for him.  She states he is doing poorly.  He has shortness of breath and lower extremity edema.  She felt like when she was giving him Lasix 40 mg every day he was getting too dry.  She she has been using it approximately every other day.  She thinks his weight is up 2 pounds today.  Over the past month he has had 2 laying in the recliner secondary to orthopnea.  He has decreased appetite and no energy  for last 2 weeks.  Patient mentions episode of chest pain on Tuesday.  He has lower extremity edema.  He denies palpitations but has new atrial fibrillation on EKG.  His wife asked about palliative care that was brought up by one of their neighbors.  Patient has been very really resistant for hospitalization and thinks his wife can treat him.  Patient's had to now wear 4 L oxygen all day.  He used to wear oxygen just at nighttime.    Past Medical History:  Diagnosis Date   Anxiety    Arthritis    Complication of anesthesia    hx prolonged post op oxygen dependent/  hx hallucinations for 2 day post op lobectomy 2012   COPD, severe ([place])    Depression    Essential hypertension    Hearing loss of both ears    does wear his hearing aids   History of arterial bypass of lower extremity    RIGHT FEM-POP   History of CHF (congestive heart failure)    History of DVT of lower extremity    History of panic attacks    Hyperlipidemia with target LDL less than 70    Left main coronary artery disease 2004   a) Severe LM CAD 2004 -->s/p  cabg x4 (LIMA-LAD, free RIMA-RI, SVG-OM 2, SVG-RPDA); b) 06/2014: Abnormal Myoview --> c)cardiac Cath Jan 2016: pLM 70%, pLAD 40-50%- patent LIMA-LAD.  RI -competitive flow noted from RIMA-RI graft, circumflex, normal caliber with moderate OM1.  OM 2 occluded.  SVG-OM 2 patent. 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    does not wear his dentures   Nocturia    OSA (obstructive sleep apnea)    uses O2  via [place]  --  intolerate to CPAP   Peripheral arterial occlusive disease ([place])    2011   a) Gore-Tex graft right AK popA-BK popA --> b) 2012: thrombosed graft --> thrombectomy with dacron patch = 1 V runnof via [place]; c) 01/2014: R femoropopliteal bypass with left femoral vein;; followed by dr Early--  per last dopplers 06-28-2014 no change right graft,  50-74% stenosis common and left mid  superficial femoral artery   PTSD (post-traumatic stress disorder)    anxiety attack's---  [place] Vet   Recurrent productive cough    SMOKER'S COUGH   S/P CABG x 4 07/2002   a) LIMA-LAD, freeRIMA-RI, SVG-OM2, SVG-RPDA ; --> December 2015 Myoview with mostly fixed inferior defect --> cardiac cath January 2016 revealed CTO of native RCA and SVG RCA.  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--- a) 04/2011: R L Lobectomy & med node dissection ([id number]) - [facility] [place], [place]) w/o post-op Rx; b) CT-A Chest Jan 2013: R pl effusion, R hilar LAN (3.3 cm x 2.8 cm & 2.2 cm x 1.3 cm); c) 2/'13 PET-CT Chest: Bilat Hilar LAN, no distant Mets  d) CT chest 9/'14: Stable shotty hilar nodes bilaterally w/ no pathologic sized LAD or suspicious Pulm nodule; ;;  (oncologist at   Wears glasses     Past Surgical History:  Procedure Laterality Date   ABDOMINAL AORTOGRAM N/A 11/12/2017   Procedure: ABDOMINAL AORTOGRAM;  Surgeon: Waynetta Sandy, MD;  Location: [facility];  Service: Cardiovascular;  Laterality: N/A;   ABDOMINAL AORTOGRAM W/LOWER EXTREMITY Left 02/10/2018   Procedure: ABDOMINAL AORTOGRAM W/LOWER EXTREMITY;  Surgeon: Serafina Mitchell, MD;  Location: [facility];  Service: Cardiovascular;  Laterality: Left;   APPLICATION OF WOUND VAC Left 12/03/2017   Procedure: EXCHANGE OF WOUND VAC LEFT GROIN,;  Surgeon: Rosetta Posner, MD;  Location: [facility];  Service: Vascular;  Laterality: Left;   CARDIAC CATHETERIZATION  07/13/2002   Ischemia RCA region on Cardiolite// 60-70% ostial left main, 50% midCx, 60-70% mid RI,  70% mRCA, 90% JeNu and crux 75% RCA ,  95% PLA//  severe LM and 3Vessel CAD, perserved LV, ef 60%   COLONOSCOPY  2008    WNL   CORONARY ARTERY BYPASS GRAFT  07/27/2002   LIMA-LAD, freeRIMA-RI,SVG-OM2, SVG-rPDA;  Dr. Roxan Hockey   CYSTOSCOPY W/ URETERAL STENT PLACEMENT N/A 02/21/2015   Procedure: BILATERAL RETROGRADE PYELOGRAM;  Surgeon: Festus Aloe, MD;  Location: [facility];  Service: Urology;  Laterality: N/A;   ENDARTERECTOMY FEMORAL Left 11/17/2017   Procedure: LEFT Femoral Endarterectomy with Patch Angioplasty;  Surgeon: Rosetta Posner, MD;  Location[facility] OR;  Service: Vascular;  Laterality: Left;   EYE SURGERY Left 05/2012   FEMORAL ARTERY - POPLITEAL ARTERY BYPASS GRAFT Right 10-24-2008   dr early   w/ GoreTex graft   FEMORAL-POPLITEAL BYPASS GRAFT Left 02/11/2018  Procedure: BYPASS GRAFT LOWER EXTREMITY;  Surgeon: Rosetta Posner, MD;  Location: [facility] OR;  Service: Vascular;  Laterality: Left;   FEMORAL-TIBIAL BYPASS GRAFT Right 01/10/2014   Procedure: Right Femoral to Posterior Tibial Bypass Graft using Left Leg Vein, Thrombectomy Right Common Femoral and Profunda of Leg . ;  Surgeon: Rosetta Posner, MD;  Location: [facility];  Service: Vascular;  Laterality: Right;   HYDROCELE EXCISION Left    LEFT ANKLE SURGERY  1990's   LEFT HEART CATHETERIZATION WITH CORONARY/GRAFT ANGIOGRAM N/A 07/12/2014   Procedure: LEFT HEART CATHETERIZATION WITH Beatrix Fetters;  Surgeon: Troy Sine, MD;  Location: [facility] CATH LAB;  Service: Cardiovascular;  Laterality: N/A;  pLM 70%, pLAD 40-50%- patent LIMA-LAD.  RI -competitive flow noted from fRIMA-RI graft, circumflex, normal caliber with moderate OM1.  OM 2 occluded.  SVG-OM 2 patent. RCA CTO after large RVM, faint R-R and LAD septal-PDA collateral   LOWER EXTREMITY ANGIOGRAPHY Bilateral 11/12/2017   Procedure: LOWER EXTREMITY ANGIOGRAPHY - Left Lower;  Surgeon: Waynetta Sandy, MD;  Location: [facility];  Service: Cardiovascular;  Laterality: Bilateral;   NM MYOVIEW LTD  11/'15; 3/'19, 5/'20   a) Mod sized, mod intensity/partially reversible inferior defect c/w  prior infarct mild/mod peri-infarct ischemia.  Inferoseptal HK.   INTERMEDIATE RISK. --> Cath showed CTO of Native RCA & SVG-rPDA.; b) EF ~42%. Inferior-Inferoseptal defect - likely old scar - INTERMEDIATE RISK b/c low EF (Echo EF 50-55%) - med Rx; c) [place] -by report - Non-ischemic.   PERIPHERAL VASCULAR INTERVENTION Left 02/10/2018   Procedure: PERIPHERAL VASCULAR INTERVENTION;  Surgeon: Serafina Mitchell, MD;  Location: [facility];  Service: Cardiovascular;  Laterality: Left;  left common iliac stent, attempted left SFA stent   RIGHT ABOVE KNEE POPLITEAL GRAFT TO BELOW KNEE POPLITEAL ARTERY BYPASS WITH REVERSE SAPHENOUS VEIN  03/02/2010   THROMBECTOMY OF RIGHT FEMORAL TO ABOVE KNEE POPLITEAL GORETEX GRAFT ANGIOPLASTY OF GORETEX AND SAPHENOUS VEIN JUNCTION AND ABOVE KNEE POPLITEAL ARTERY   10-06-2010   DR EARLY   TONSILLECTOMY     TRANSTHORACIC ECHOCARDIOGRAM  09/2017   EF 50-55%.  More accurate - Mild inferolateral HK (c/w known RCA occlusion).  GR 1 DD.  Mild LA dilation mildly increased PA pressures with mild RV/RA dilation.   TRANSURETHRAL RESECTION OF BLADDER TUMOR N/A 02/21/2015   Procedure: TRANSURETHRAL RESECTION OF BLADDER TUMOR (TURBT);  Surgeon: Festus Aloe, MD;  Location: [facility];  Service: Urology;  Laterality: N/A;   VIDEO ASSISTED THORACOSCOPY (VATS)/WEDGE RESECTION Right 05-01-2011    FOR[place]TH   LOWER LOBECTOMY W/  NODE DISSECTION   WOUND EXPLORATION Left 11/28/2017   Procedure: WOUND EXPLORATION GROIN, WASHOUT AND WOUND VAC APPLICATION;  Surgeon: [employer], MD;  Location: [facility];  Service: Vascular;  Laterality: Left;    Current Medications: Current Meds  Medication Sig   acetaminophen (TYLENOL) 500 MG tablet Take 1,000 mg by mouth every 6 (six) hours as needed for moderate pain or headache.    ADVAIR DISKUS 500-50 MCG/DOSE AEPB Inhale 1 puff into the lungs 2 (two) times daily.    albuterol (PROVENTIL HFA;VENTOLIN HFA) 108 (90 BASE) MCG/ACT inhaler Inhale 1 puff into the lungs every 6 (six) hours as  needed for wheezing or shortness of breath.   albuterol (PROVENTIL) (2.5 MG/3ML) 0.083% nebulizer solution Take 2.5 mg by nebulization every 6 (six) hours as needed for wheezing or shortness of breath.   aspirin EC 81 MG tablet Take 1 tablet (  81 mg total) by mouth daily. Swallow whole.   cholecalciferol (VITAMIN D) 25 MCG (1000 UNIT) tablet Take 1,000 Units by mouth daily.   CYMBALTA 60 MG capsule Take 60 mg by mouth daily.   fluticasone-salmeterol (ADVAIR) 250-50 MCG/ACT AEPB INHALE 1 INHALATION BY MOUTH TWICE A DAY (RINSE MOUTH WELL WITH WATER AFTER EACH USE (CONVERTED FROM SYMBICORT. NOTE NEW DIRECTIONS)   furosemide (LASIX) 40 MG tablet Take 1 tablet (40 mg total) by mouth daily.   nitroGLYCERIN (NITROSTAT) 0.4 MG SL tablet Place 1 tablet (0.4 mg total) under the tongue every 5 (five) minutes as needed for chest pain.   OXYGEN Inhale 4 L into the lungs See admin instructions. Uses 4L at bedtime and if needed throughout the day -- intolerant to CPAP   prazosin (MINIPRESS) 1 MG capsule Take 1 mg by mouth at bedtime.   QUEtiapine (SEROQUEL XR) 300 MG 24 hr tablet Take 150 mg by mouth at bedtime.   ramipril (ALTACE) 2.5 MG capsule Take 2.5 mg by mouth every morning.    tiotropium (SPIRIVA) 18 MCG inhalation capsule Place 18 mcg into inhaler and inhale daily.   traZODone (DESYREL) 100 MG tablet Take 100 mg by mouth at bedtime.   WIXELA INHUB 250-50 MCG/ACT AEPB Inhale 1 puff into the lungs 2 (two) times daily.     Allergies:   Aspirin, Crestor [rosuvastatin calcium], Lipitor [atorvastatin calcium], Zetia [ezetimibe], Zocor [simvastatin - high dose], Aripiprazole, Atorvastatin, Morphine, Simvastatin, Aripiprazole, Effexor [venlafaxine hydrochloride], and Morphine and related   Social History   Socioeconomic History   Marital status: Married    Spouse name: Not on file   Number of children: 2   Years of education: Not on file   Highest education level: Not on file  Occupational History   Not  on file  Tobacco Use   Smoking status: Every Day    Packs/day: 1.00    Years: 60.00    Pack years: 60.00    Types: Cigarettes    Start date: 09/14/1957   Smokeless tobacco: Never   Tobacco comments:    1/4 ppk 09/28/20  Vaping Use   Vaping Use: Never used  Substance and Sexual Activity   Alcohol use: No   Drug use: No   Sexual activity: Not on file  Other Topics Concern   Not on file  Social History Narrative   He is married with 2 children. Does not exercise regularly.   He and his wife have been recently living in [place], [place] (but just moved back to [place], [place] in November 2018).   He still smokes about a half-pack a day and is not interested in quitting. No alcohol or recreational drug use.      I confirmed with him and his wife that he does have a DNR order signed and displayed.      Social Determinants of Health   Financial Resource Strain: Not on file  Food Insecurity: Not on file  Transportation Needs: Not on file  Physical Activity: Not on file  Stress: Not on file  Social Connections: Not on file     Family History: The patient's family history includes Alzheimer's disease (age of onset: 66) in his father; Other (age of onset: 16) in his mother; Stroke in his father and mother.  ROS:   Please see the history of present illness.     EKGs/Labs/Other Studies Reviewed:    EKG:  The ekg ordered today demonstrates atrial fibrillation (new), heart rate 84.  Recent Labs: 07/13/2021: B Natriuretic Peptide 1,058.3 07/27/2021: Magnesium 2.2 08/20/2021: ALT 15; BUN 24; Creatinine, Ser 0.92; Hemoglobin 14.4; Platelets 120; Potassium 4.6; Sodium 140; TSH 3.210   Recent Lipid Panel Lab Results  Component Value Date/Time   CHOL 190 08/20/2021 12:00 AM   TRIG 107 08/20/2021 12:00 AM   HDL 53 08/20/2021 12:00 AM   LDLCALC 118 08/20/2021 12:00 AM    Physical Exam:    VS:  BP 100/71    Pulse 84    Ht 6' (1.829 m)    Wt 169 lb (76.7 kg)    BMI 22.92 kg/m    No  data found.  Wt Readings from Last 3 Encounters:  09/06/21 169 lb (76.7 kg)  08/27/21 167 lb 1.9 oz (75.8 kg)  08/07/21 167 lb 9.6 oz (76 kg)     GEN: frail, in wheelchair, wearing O2 HEENT: Normal NECK: JVD to mid neck at 90 degrees; No carotid bruits CARDIAC: irreg irreg RESPIRATORY:  Clear to auscultation without rales, wheezing or rhonchi  ABDOMEN: Soft, non-tender, non-distended MUSCULOSKELETAL: 2+ edema mid shin bilaterally SKIN: Warm and dry NEUROLOGIC:  Alert and oriented PSYCHIATRIC:  Normal affect     ASSESSMENT AND PLAN   Acute systolic heart failure, hypervolemic -Echo in 2019 showed EF 50-55%.  2D echo in February 2023 showed EF 20-25%, global hypokinesis, grade 2 diastolic dysfunction severely reduced RV systolic function, mildly enlarged RV, moderately elevated pulmonary artery pressures (RVSP 59 mmHg), severely dilated left atria, moderate mitral regurgitation, moderate tricuspid rotation, mild AI -Not DM, TSH WNL.   -Discussed with Dr. Ellyn Hack.  We recommend outpatient left and right heart catheterization with the heart failure team.  I advised him to take Lasix regularly until his procedure in hopes that he will be able to lay flat for the [facility].  We told the patient he would likely need a hospital stay to get fully optimized, possibly with Swan-Ganz catheter. -Not a candidate for beta-blocker given his decompensation. -Continue Ramipril.  Pressure is soft for Entresto.   -Add spironolactone inpatient.  Pt had normal K & Cr on 08/20/21. -Narrow QRS on EKG. Not sure if life expectancy is over 1 year to qualify for ICD placement if his EF does not recover. -Consider palliative med consult with severe COPD and severe left and right-sided heart failure.    CAD status post CABG -CABG in 2014: LIMA graft to the LAD, a free RIMA graft to the ramus and related vessel, saphenous vein graft to the OM 2 vessel, and saphenous vein graft to the RCA -LHC 07/2014:  -Significant  native coronary obstructive disease with 70% proximal left main stenosis; 50% proximal LAD stenosis, occluded OM 2 branch arising from the distal circumflex and proximal RCA occlusion after anterior RV marginal branch with left to right predominant septal collateralization to the distal RCA territory. -Patent left internal artery graft supplying the mid LAD. -Patent saphenous vein graft supplying the OM 2 vessel. -Patent free RIMA graft supplying the ramus intermediate vessel. -Occluded saphenous vein graft which had previously supplied the RCA. Treatment: -Intolerant to statin -Previously declined aspirin due to bruising, restarted on aspirin 81 mg daily in January 2023  PAD -Duplex 08/2020: : Patent right femoral-PTA bypass graft without evidence of stenosis.  Patent left femoral-AK popliteal bypass graft without evidence of  stenosis.  -Continue statin and aspirin therapy.  Hypertension, controlled -Continue current medications.  Hyperlipidemia -LDL 118 in 08/2021. -Intolerant to statins.  Atrial fibrillation, rate controlled -New diagnosis -Likely  exacerbated by his heart failure and COPD -Consider starting anticoagulation after his heart catheterization  Disposition - Follow-up with [facility] and refer to heart failure clinic.   Shared Decision Making/Informed Consent The risks [stroke (1 in 1000), death (1 in 1000), kidney failure [usually temporary] (1 in 500), bleeding (1 in 200), allergic reaction [possibly serious] (1 in 200)], benefits (diagnostic support and management of coronary artery disease) and alternatives of a cardiac catheterization were discussed in detail with Mr. Laswell and he is willing to proceed.    Medication Adjustments/Labs and Tests Ordered: Current medicines are reviewed at length with the patient today.  Concerns regarding medicines are outlined above.  Orders Placed This Encounter  Procedures   Basic metabolic panel   CBC   AMB referral to CHF  clinic   EKG 12-Lead   No orders of the defined types were placed in this encounter.   Patient Instructions  Medication Instructions:  No Changes *If you need a refill on your cardiac medications before your next appointment, please call your pharmacy*   Lab Work: BMET, CBC If you have labs (blood work) drawn today and your tests are completely normal, you will receive your results only by: [place] (if you have MyChart) OR A paper copy in the mail If you have any lab test that is abnormal or we need to change your treatment, we will call you to review the results.   Testing/Procedures:  [address] [place] [address] Dept: [phone] Loc: [place]  09/06/2021  You are scheduled for a Cardiac Catheterization on Wednesday, March 8 with Dr. Loralie Champagne.  1. Please arrive at the Main Entrance A at [facility]: [place],  [address] at 6:30 AM (This time is two hours before your procedure to ensure your preparation). Free valet parking service is available.   Special note: Every effort is made to have your procedure done on time. Please understand that emergencies sometimes delay scheduled procedures.  2. Diet: Do not eat solid foods after midnight.  You may have clear liquids until 5 AM upon the day of the procedure.  3. Labs: You will need to have blood drawn on Thursday, March 2 at [place] [facility]  Open: [place] (Lunch 12:30 - 1:30)   Phone: [phone]. You do not need to be fasting.  4. Medication instructions in preparation for your procedure:   Contrast Allergy: No    Current Outpatient Medications (Cardiovascular):    furosemide (LASIX) 40 MG tablet, Take 1 tablet (40 mg total) by mouth daily.   nitroGLYCERIN (NITROSTAT) 0.4 MG SL tablet, Place 1 tablet (0.4 mg total)  under the tongue every 5 (five) minutes as needed for chest pain.   prazosin (MINIPRESS) 1 MG capsule, Take 1 mg by mouth at bedtime.   ramipril (ALTACE) 2.5 MG capsule, Take 2.5 mg by mouth every morning.   Current Outpatient Medications (Respiratory):    ADVAIR DISKUS 500-50 MCG/DOSE AEPB, Inhale 1 puff into the lungs 2 (two) times daily.    albuterol (PROVENTIL HFA;VENTOLIN HFA) 108 (90 BASE) MCG/ACT inhaler, Inhale 1 puff into the lungs every 6 (six) hours as needed for wheezing or shortness of breath.   albuterol (PROVENTIL) (2.5 MG/3ML) 0.083% nebulizer solution, Take 2.5 mg by nebulization every 6 (six) hours as needed for wheezing or shortness of breath.   fluticasone-salmeterol (ADVAIR)  250-50 MCG/ACT AEPB, INHALE 1 INHALATION BY MOUTH TWICE A DAY (RINSE MOUTH WELL WITH WATER AFTER EACH USE (CONVERTED FROM SYMBICORT. NOTE NEW DIRECTIONS)   tiotropium (SPIRIVA) 18 MCG inhalation capsule, Place 18 mcg into inhaler and inhale daily.   WIXELA INHUB 250-50 MCG/ACT AEPB, Inhale 1 puff into the lungs 2 (two) times daily.  Current Outpatient Medications (Analgesics):    acetaminophen (TYLENOL) 500 MG tablet, Take 1,000 mg by mouth every 6 (six) hours as needed for moderate pain or headache.    aspirin EC 81 MG tablet, Take 1 tablet (81 mg total) by mouth daily. Swallow whole.   Current Outpatient Medications (Other):    cholecalciferol (VITAMIN D) 25 MCG (1000 UNIT) tablet, Take 1,000 Units by mouth daily.   CYMBALTA 60 MG capsule, Take 60 mg by mouth daily.   OXYGEN, Inhale 4 L into the lungs See admin instructions. Uses 4L at bedtime and if needed throughout the day -- intolerant to CPAP   QUEtiapine (SEROQUEL XR) 300 MG 24 hr tablet, Take 150 mg by mouth at bedtime.   traZODone (DESYREL) 100 MG tablet, Take 100 mg by mouth at bedtime. *For reference purposes while preparing patient instructions.   Delete this med list prior to printing instructions for patient.*  No     On the  morning of your procedure, take Aspirin and any morning medicines NOT listed above.  You may use sips of water.  5. Plan to go home the same day, you will only stay overnight if medically necessary. 6. You MUST have a responsible adult to drive you home. 7. An adult MUST be with you the first 24 hours after you arrive home. 8. Bring a current list of your medications, and the last time and date medication taken. 9. Bring ID and current insurance cards. 10.Please wear clothes that are easy to get on and off and wear slip-on shoes.  Thank you for allowing [place] to care for you!   -- [facility]    Follow-Up: At [facility], you and your health needs are our priority.  As part of our continuing mission to provide you with exceptional heart care, we have created designated Provider Care Teams.  These Care Teams include your primary Cardiologist (physician) and Advanced Practice Providers (APPs -  Physician Assistants and Nurse Practitioners) who all work together to provide you with the care you need, when you need it.  We recommend signing up for the patient portal called "MyChart".  Sign up information is provided on this After Visit Summary.  MyChart is used to connect with patients for Virtual Visits (Telemedicine).  Patients are able to view lab/test results, encounter notes, upcoming appointments, etc.  Non-urgent messages can be sent to your provider as well.   To learn more about what you can do with MyChart, go to [email or web address].    Your next appointment:   6 week(s)  The format for your next appointment:   In Person  Provider:   Oswaldo Milian      Signed, Warren Lacy, PA-C  09/06/2021 1:09 PM    [place]

## 2021-09-06 ENCOUNTER — Other Ambulatory Visit: Payer: Self-pay

## 2021-09-06 ENCOUNTER — Encounter: Payer: Self-pay | Admitting: Physician Assistant

## 2021-09-06 ENCOUNTER — Ambulatory Visit (INDEPENDENT_AMBULATORY_CARE_PROVIDER_SITE_OTHER): Payer: Medicare Other | Admitting: Physician Assistant

## 2021-09-06 ENCOUNTER — Other Ambulatory Visit: Payer: Self-pay | Admitting: Physician Assistant

## 2021-09-06 VITALS — BP 100/71 | HR 84 | Ht 72.0 in | Wt 169.0 lb

## 2021-09-06 DIAGNOSIS — Z951 Presence of aortocoronary bypass graft: Secondary | ICD-10-CM

## 2021-09-06 DIAGNOSIS — I5021 Acute systolic (congestive) heart failure: Secondary | ICD-10-CM | POA: Diagnosis not present

## 2021-09-06 DIAGNOSIS — I2510 Atherosclerotic heart disease of native coronary artery without angina pectoris: Secondary | ICD-10-CM

## 2021-09-06 DIAGNOSIS — I779 Disorder of arteries and arterioles, unspecified: Secondary | ICD-10-CM

## 2021-09-06 DIAGNOSIS — I480 Paroxysmal atrial fibrillation: Secondary | ICD-10-CM

## 2021-09-06 DIAGNOSIS — I10 Essential (primary) hypertension: Secondary | ICD-10-CM | POA: Diagnosis not present

## 2021-09-06 MED ORDER — SODIUM CHLORIDE 0.9% FLUSH: 3.0000 mL | Freq: Two times a day (BID) | INTRAVENOUS | Status: AC

## 2021-09-06 NOTE — Patient Instructions (Signed)
Medication Instructions:  ?No Changes ?*If you need a refill on your cardiac medications before your next appointment, please call your pharmacy* ? ? ?Lab Work: ?BMET, CBC ?If you have labs (blood work) drawn today and your tests are completely normal, you will receive your results only by: ?MyChart Message (if you have MyChart) OR ?A paper copy in the mail ?If you have any lab test that is abnormal or we need to change your treatment, we will call you to review the results. ? ? ?Testing/Procedures: ? ?[facility] ?[place] ?[address] ?[place] [address] ?Dept: [phone] ?Loc: [phone] ? ?John Ball  09/06/2021 ? ?You are scheduled for a Cardiac Catheterization on Wednesday, March 8 with Dr. Loralie Champagne. ? ?1. Please arrive at the Main Entrance A at [facility]: [place], [place] [address] at 6:30 AM (This time is two hours before your procedure to ensure your preparation). Free valet parking service is available.  ? ?Special note: Every effort is made to have your procedure done on time. Please understand that emergencies sometimes delay scheduled procedures. ? ?2. Diet: Do not eat solid foods after midnight.  You may have clear liquids until 5 AM upon the day of the procedure. ? ?3. Labs: You will need to have blood drawn on Thursday, March 2 at [place], [place]  ?Open: 8am - 5pm (Lunch 12:30 - 1:30)   Phone: [phone]. You do not need to be fasting. ? ?4. Medication instructions in preparation for your procedure: ? ? Contrast Allergy: No ? ? ? ?Current Outpatient Medications (Cardiovascular):  ?  furosemide (LASIX) 40 MG tablet, Take 1 tablet (40 mg total) by mouth daily. ?  nitroGLYCERIN (NITROSTAT) 0.4 MG SL tablet, Place 1 tablet (0.4 mg total) under the tongue every 5 (five) minutes as needed for chest pain. ?  prazosin (MINIPRESS) 1 MG capsule, Take 1 mg by  mouth at bedtime. ?  ramipril (ALTACE) 2.5 MG capsule, Take 2.5 mg by mouth every morning.  ? ?Current Outpatient Medications (Respiratory):  ?  ADVAIR DISKUS 500-50 MCG/DOSE AEPB, Inhale 1 puff into the lungs 2 (two) times daily.  ?  albuterol (PROVENTIL HFA;VENTOLIN HFA) 108 (90 BASE) MCG/ACT inhaler, Inhale 1 puff into the lungs every 6 (six) hours as needed for wheezing or shortness of breath. ?  albuterol (PROVENTIL) (2.5 MG/3ML) 0.083% nebulizer solution, Take 2.5 mg by nebulization every 6 (six) hours as needed for wheezing or shortness of breath. ?  fluticasone-salmeterol (ADVAIR) 250-50 MCG/ACT AEPB, INHALE 1 INHALATION BY MOUTH TWICE A DAY (RINSE MOUTH WELL WITH WATER AFTER EACH USE (CONVERTED FROM SYMBICORT. NOTE NEW DIRECTIONS) ?  tiotropium (SPIRIVA) 18 MCG inhalation capsule, Place 18 mcg into inhaler and inhale daily. ?  WIXELA INHUB 250-50 MCG/ACT AEPB, Inhale 1 puff into the lungs 2 (two) times daily. ? ?Current Outpatient Medications (Analgesics):  ?  acetaminophen (TYLENOL) 500 MG tablet, Take 1,000 mg by mouth every 6 (six) hours as needed for moderate pain or headache.  ?  aspirin EC 81 MG tablet, Take 1 tablet (81 mg total) by mouth daily. Swallow whole. ? ? ?Current Outpatient Medications (Other):  ?  cholecalciferol (VITAMIN D) 25 MCG (1000 UNIT) tablet, Take 1,000 Units by mouth daily. ?  CYMBALTA 60 MG capsule, Take 60 mg by mouth daily. ?  OXYGEN, Inhale 4 L into the lungs See admin instructions. Uses 4L at bedtime and  if needed throughout the day -- intolerant to CPAP ?  QUEtiapine (SEROQUEL XR) 300 MG 24 hr tablet, Take 150 mg by mouth at bedtime. ?  traZODone (DESYREL) 100 MG tablet, Take 100 mg by mouth at bedtime. ?*For reference purposes while preparing patient instructions.   ?Delete this med list prior to printing instructions for patient.* ? ?No ? ? ? ? ?On the morning of your procedure, take Aspirin and any morning medicines NOT listed above.  You may use sips of water. ? ?5.  Plan to go home the same day, you will only stay overnight if medically necessary. ?6. You MUST have a responsible adult to drive you home. ?7. An adult MUST be with you the first 24 hours after you arrive home. ?8. Bring a current list of your medications, and the last time and date medication taken. ?9. Bring ID and current insurance cards. ?10.Please wear clothes that are easy to get on and off and wear slip-on shoes. ? ?Thank you for allowing [place] to care for you! ?  -- [facility]  ? ? ?Follow-Up: ?At [facility], you and your health needs are our priority.  As part of our continuing mission to provide you with exceptional heart care, we have created designated Provider Care Teams.  These Care Teams include your primary Cardiologist (physician) and Advanced Practice Providers (APPs -  Physician Assistants and Nurse Practitioners) who all work together to provide you with the care you need, when you need it. ? ?We recommend signing up for the patient portal called "MyChart".  Sign up information is provided on this After Visit Summary.  MyChart is used to connect with patients for Virtual Visits (Telemedicine).  Patients are able to view lab/test results, encounter notes, upcoming appointments, etc.  Non-urgent messages can be sent to your provider as well.   ?To learn more about what you can do with MyChart, go to [email or web address].   ? ?Your next appointment:   ?6 week(s) ? ?The format for your next appointment:   ?In Person ? ?Provider:   ?Oswaldo Milian ? ?  ?

## 2021-09-06 NOTE — Progress Notes (Signed)
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 ?

## 2021-09-06 NOTE — H&P (View-Only) (Signed)
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 ?

## 2021-09-07 ENCOUNTER — Telehealth: Payer: Self-pay | Admitting: Physician Assistant

## 2021-09-07 LAB — CBC
Hematocrit: 44.4 % (ref 37.5–51.0)
Hemoglobin: 15.3 g/dL (ref 13.0–17.7)
MCH: 35.8 pg — ABNORMAL HIGH (ref 26.6–33.0)
MCHC: 34.5 g/dL (ref 31.5–35.7)
MCV: 104 fL — ABNORMAL HIGH (ref 79–97)
Platelets: 142 10*3/uL — ABNORMAL LOW (ref 150–450)
RBC: 4.27 x10E6/uL (ref 4.14–5.80)
RDW: 13.7 % (ref 11.6–15.4)
WBC: 4.9 10*3/uL (ref 3.4–10.8)

## 2021-09-07 LAB — BASIC METABOLIC PANEL
BUN/Creatinine Ratio: 23 (ref 10–24)
BUN: 25 mg/dL (ref 8–27)
CO2: 25 mmol/L (ref 20–29)
Calcium: 10 mg/dL (ref 8.6–10.2)
Chloride: 99 mmol/L (ref 96–106)
Creatinine, Ser: 1.07 mg/dL (ref 0.76–1.27)
Glucose: 122 mg/dL — ABNORMAL HIGH (ref 70–99)
Potassium: 4.9 mmol/L (ref 3.5–5.2)
Sodium: 143 mmol/L (ref 134–144)
eGFR: 71 mL/min/{1.73_m2} (ref >59)

## 2021-09-07 NOTE — Telephone Encounter (Signed)
Spouse had questions about patient's cardiac cath on 3/8. She wanted to know if the doctor performing the cath would place stents if needed and what would the cardiac surgery team do. I explained that the physician doing the cath usually places stents when indicated. She had more questions regarding cardiac surgery and what would happen if an emergency occurs. I informed spouse that I would send her concerns to Caron Presume so she could get clarification. ?

## 2021-09-07 NOTE — Telephone Encounter (Signed)
Patient's wife wants to know if they will do surgery right then after the cath, from her understanding the heart team will be there during the cath.  ?

## 2021-09-10 NOTE — Telephone Encounter (Signed)
Spoke with wife and gave her j. Quentin Ore, PA-C answer: "The plan is for left and right heart cath on Wednesday.  The left heart catheterization will look at the bypass grafts and coronary arteries.  If there is a blockage, they may stent it at the time of the procedure. The right heart catheterization will look at the right heart and lung pressures.  It will tell them how much extra fluid is on board and what medications will help.    ?It is possible that it may take a few days to get the heart pressures as good as they can get.  Please be patient with the process as we want you to feel better and take care of you safely and effectively.    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 I explained that she would have to speak with patient's PCP. She voiced understanding of this conversation. ?

## 2021-09-12 ENCOUNTER — Other Ambulatory Visit: Payer: Self-pay

## 2021-09-12 ENCOUNTER — Inpatient Hospital Stay (HOSPITAL_COMMUNITY)
Admission: AD | Admit: 2021-09-12 | Discharge: 2021-09-17 | DRG: 286 | Disposition: A | Discharge status: Home Health | Payer: Medicare Other | Attending: Cardiology | Admitting: Cardiology

## 2021-09-12 ENCOUNTER — Inpatient Hospital Stay: Payer: Self-pay

## 2021-09-12 ENCOUNTER — Encounter (HOSPITAL_COMMUNITY)
Admission: AD | Disposition: A | Discharge status: Home Health | Payer: Self-pay | Source: Home / Self Care | Attending: Cardiology

## 2021-09-12 ENCOUNTER — Telehealth: Payer: Self-pay

## 2021-09-12 ENCOUNTER — Other Ambulatory Visit (HOSPITAL_COMMUNITY): Payer: Self-pay

## 2021-09-12 ENCOUNTER — Inpatient Hospital Stay (HOSPITAL_COMMUNITY): Payer: Medicare Other

## 2021-09-12 DIAGNOSIS — I4891 Unspecified atrial fibrillation: Secondary | ICD-10-CM | POA: Diagnosis present

## 2021-09-12 DIAGNOSIS — J9622 Acute and chronic respiratory failure with hypercapnia: Secondary | ICD-10-CM | POA: Diagnosis present

## 2021-09-12 DIAGNOSIS — R0603 Acute respiratory distress: Secondary | ICD-10-CM | POA: Diagnosis not present

## 2021-09-12 DIAGNOSIS — Z79899 Other long term (current) drug therapy: Secondary | ICD-10-CM

## 2021-09-12 DIAGNOSIS — I130 Hypertensive heart and chronic kidney disease with heart failure and stage 1 through stage 4 chronic kidney disease, or unspecified chronic kidney disease: Secondary | ICD-10-CM | POA: Diagnosis present

## 2021-09-12 DIAGNOSIS — R64 Cachexia: Secondary | ICD-10-CM | POA: Diagnosis present

## 2021-09-12 DIAGNOSIS — H9193 Unspecified hearing loss, bilateral: Secondary | ICD-10-CM | POA: Diagnosis present

## 2021-09-12 DIAGNOSIS — G4733 Obstructive sleep apnea (adult) (pediatric): Secondary | ICD-10-CM | POA: Diagnosis present

## 2021-09-12 DIAGNOSIS — I081 Rheumatic disorders of both mitral and tricuspid valves: Secondary | ICD-10-CM | POA: Diagnosis present

## 2021-09-12 DIAGNOSIS — I2721 Secondary pulmonary arterial hypertension: Secondary | ICD-10-CM | POA: Diagnosis present

## 2021-09-12 DIAGNOSIS — Z886 Allergy status to analgesic agent status: Secondary | ICD-10-CM

## 2021-09-12 DIAGNOSIS — G629 Polyneuropathy, unspecified: Secondary | ICD-10-CM | POA: Diagnosis present

## 2021-09-12 DIAGNOSIS — Z66 Do not resuscitate: Secondary | ICD-10-CM | POA: Diagnosis present

## 2021-09-12 DIAGNOSIS — I420 Dilated cardiomyopathy: Secondary | ICD-10-CM | POA: Diagnosis present

## 2021-09-12 DIAGNOSIS — Z951 Presence of aortocoronary bypass graft: Secondary | ICD-10-CM

## 2021-09-12 DIAGNOSIS — R0689 Other abnormalities of breathing: Secondary | ICD-10-CM

## 2021-09-12 DIAGNOSIS — I509 Heart failure, unspecified: Secondary | ICD-10-CM

## 2021-09-12 DIAGNOSIS — J449 Chronic obstructive pulmonary disease, unspecified: Secondary | ICD-10-CM | POA: Diagnosis not present

## 2021-09-12 DIAGNOSIS — N1831 Chronic kidney disease, stage 3a: Secondary | ICD-10-CM | POA: Diagnosis present

## 2021-09-12 DIAGNOSIS — Z6821 Body mass index (BMI) 21.0-21.9, adult: Secondary | ICD-10-CM

## 2021-09-12 DIAGNOSIS — D696 Thrombocytopenia, unspecified: Secondary | ICD-10-CM | POA: Diagnosis present

## 2021-09-12 DIAGNOSIS — F4310 Post-traumatic stress disorder, unspecified: Secondary | ICD-10-CM | POA: Diagnosis present

## 2021-09-12 DIAGNOSIS — Z9981 Dependence on supplemental oxygen: Secondary | ICD-10-CM

## 2021-09-12 DIAGNOSIS — F32A Depression, unspecified: Secondary | ICD-10-CM | POA: Diagnosis present

## 2021-09-12 DIAGNOSIS — J432 Centrilobular emphysema: Secondary | ICD-10-CM | POA: Diagnosis present

## 2021-09-12 DIAGNOSIS — I5023 Acute on chronic systolic (congestive) heart failure: Secondary | ICD-10-CM | POA: Diagnosis present

## 2021-09-12 DIAGNOSIS — Z85118 Personal history of other malignant neoplasm of bronchus and lung: Secondary | ICD-10-CM | POA: Diagnosis not present

## 2021-09-12 DIAGNOSIS — I739 Peripheral vascular disease, unspecified: Secondary | ICD-10-CM | POA: Diagnosis present

## 2021-09-12 DIAGNOSIS — Z20822 Contact with and (suspected) exposure to covid-19: Secondary | ICD-10-CM | POA: Diagnosis present

## 2021-09-12 DIAGNOSIS — J9621 Acute and chronic respiratory failure with hypoxia: Secondary | ICD-10-CM | POA: Diagnosis present

## 2021-09-12 DIAGNOSIS — I25810 Atherosclerosis of coronary artery bypass graft(s) without angina pectoris: Secondary | ICD-10-CM | POA: Diagnosis present

## 2021-09-12 DIAGNOSIS — R0600 Dyspnea, unspecified: Secondary | ICD-10-CM | POA: Diagnosis not present

## 2021-09-12 DIAGNOSIS — R0602 Shortness of breath: Secondary | ICD-10-CM

## 2021-09-12 DIAGNOSIS — J90 Pleural effusion, not elsewhere classified: Secondary | ICD-10-CM | POA: Diagnosis not present

## 2021-09-12 DIAGNOSIS — J441 Chronic obstructive pulmonary disease with (acute) exacerbation: Secondary | ICD-10-CM | POA: Diagnosis not present

## 2021-09-12 DIAGNOSIS — Z86718 Personal history of other venous thrombosis and embolism: Secondary | ICD-10-CM

## 2021-09-12 DIAGNOSIS — I2510 Atherosclerotic heart disease of native coronary artery without angina pectoris: Secondary | ICD-10-CM | POA: Diagnosis not present

## 2021-09-12 DIAGNOSIS — Z7982 Long term (current) use of aspirin: Secondary | ICD-10-CM

## 2021-09-12 DIAGNOSIS — F17210 Nicotine dependence, cigarettes, uncomplicated: Secondary | ICD-10-CM | POA: Diagnosis present

## 2021-09-12 DIAGNOSIS — R911 Solitary pulmonary nodule: Secondary | ICD-10-CM | POA: Diagnosis present

## 2021-09-12 DIAGNOSIS — I517 Cardiomegaly: Secondary | ICD-10-CM | POA: Diagnosis not present

## 2021-09-12 DIAGNOSIS — Z902 Acquired absence of lung [part of]: Secondary | ICD-10-CM

## 2021-09-12 DIAGNOSIS — Z888 Allergy status to other drugs, medicaments and biological substances status: Secondary | ICD-10-CM

## 2021-09-12 DIAGNOSIS — I5082 Biventricular heart failure: Secondary | ICD-10-CM | POA: Diagnosis present

## 2021-09-12 DIAGNOSIS — Z885 Allergy status to narcotic agent status: Secondary | ICD-10-CM

## 2021-09-12 DIAGNOSIS — E785 Hyperlipidemia, unspecified: Secondary | ICD-10-CM | POA: Diagnosis present

## 2021-09-12 HISTORY — PX: RIGHT/LEFT HEART CATH AND CORONARY/GRAFT ANGIOGRAPHY: CATH118267

## 2021-09-12 LAB — POCT I-STAT EG7
Acid-Base Excess: 2 mmol/L (ref 0.0–2.0)
Acid-Base Excess: 2 mmol/L (ref 0.0–2.0)
Bicarbonate: 29.7 mmol/L — ABNORMAL HIGH (ref 20.0–28.0)
Bicarbonate: 29.9 mmol/L — ABNORMAL HIGH (ref 20.0–28.0)
Calcium, Ion: 1.25 mmol/L (ref 1.15–1.40)
Calcium, Ion: 1.28 mmol/L (ref 1.15–1.40)
HCT: 44 % (ref 39.0–52.0)
HCT: 45 % (ref 39.0–52.0)
Hemoglobin: 15 g/dL (ref 13.0–17.0)
Hemoglobin: 15.3 g/dL (ref 13.0–17.0)
O2 Saturation: 61 %
O2 Saturation: 63 %
Potassium: 3.7 mmol/L (ref 3.5–5.1)
Potassium: 3.8 mmol/L (ref 3.5–5.1)
Sodium: 139 mmol/L (ref 135–145)
Sodium: 139 mmol/L (ref 135–145)
TCO2: 31 mmol/L (ref 22–32)
TCO2: 32 mmol/L (ref 22–32)
pCO2, Ven: 58 mmHg (ref 44–60)
pCO2, Ven: 59 mmHg (ref 44–60)
pH, Ven: 7.313 (ref 7.25–7.43)
pH, Ven: 7.317 (ref 7.25–7.43)
pO2, Ven: 36 mmHg (ref 32–45)
pO2, Ven: 36 mmHg (ref 32–45)

## 2021-09-12 LAB — COMPREHENSIVE METABOLIC PANEL
ALT: 14 U/L (ref 0–44)
AST: 18 U/L (ref 15–41)
Albumin: 3.7 g/dL (ref 3.5–5.0)
Alkaline Phosphatase: 45 U/L (ref 38–126)
Anion gap: 8 (ref 5–15)
BUN: 24 mg/dL — ABNORMAL HIGH (ref 8–23)
CO2: 31 mmol/L (ref 22–32)
Calcium: 9 mg/dL (ref 8.9–10.3)
Chloride: 98 mmol/L (ref 98–111)
Creatinine, Ser: 1.2 mg/dL (ref 0.61–1.24)
GFR, Estimated: >60 mL/min (ref >60)
Glucose, Bld: 124 mg/dL — ABNORMAL HIGH (ref 70–99)
Potassium: 3.9 mmol/L (ref 3.5–5.1)
Sodium: 137 mmol/L (ref 135–145)
Total Bilirubin: 0.5 mg/dL (ref 0.3–1.2)
Total Protein: 6.1 g/dL — ABNORMAL LOW (ref 6.5–8.1)

## 2021-09-12 LAB — CBC
HCT: 43.3 % (ref 39.0–52.0)
Hemoglobin: 14.3 g/dL (ref 13.0–17.0)
MCH: 36.1 pg — ABNORMAL HIGH (ref 26.0–34.0)
MCHC: 33 g/dL (ref 30.0–36.0)
MCV: 109.3 fL — ABNORMAL HIGH (ref 80.0–100.0)
Platelets: 117 10*3/uL — ABNORMAL LOW (ref 150–400)
RBC: 3.96 MIL/uL — ABNORMAL LOW (ref 4.22–5.81)
RDW: 15 % (ref 11.5–15.5)
WBC: 4 10*3/uL (ref 4.0–10.5)
nRBC: 0 % (ref 0.0–0.2)

## 2021-09-12 LAB — MRSA NEXT GEN BY PCR, NASAL: MRSA by PCR Next Gen: NOT DETECTED

## 2021-09-12 LAB — COOXEMETRY PANEL
Carboxyhemoglobin: 2.7 % — ABNORMAL HIGH (ref 0.5–1.5)
Methemoglobin: 2.5 % — ABNORMAL HIGH (ref 0.0–1.5)
O2 Saturation: 72.4 %
Total hemoglobin: 13.4 g/dL (ref 12.0–16.0)

## 2021-09-12 LAB — BRAIN NATRIURETIC PEPTIDE: B Natriuretic Peptide: 2341 pg/mL — ABNORMAL HIGH (ref 0.0–100.0)

## 2021-09-12 LAB — SARS CORONAVIRUS 2 BY RT PCR (HOSPITAL ORDER, PERFORMED IN ~~LOC~~ HOSPITAL LAB): SARS Coronavirus 2: NEGATIVE

## 2021-09-12 SURGERY — RIGHT/LEFT HEART CATH AND CORONARY/GRAFT ANGIOGRAPHY
Anesthesia: LOCAL

## 2021-09-12 MED ORDER — LIDOCAINE HCL (PF) 1 % IJ SOLN
INTRAMUSCULAR | Status: AC
  Filled 2021-09-12: qty 30

## 2021-09-12 MED ORDER — HEPARIN SODIUM (PORCINE) 1000 UNIT/ML IJ SOLN
INTRAMUSCULAR | Status: AC
  Filled 2021-09-12: qty 10

## 2021-09-12 MED ORDER — HEPARIN (PORCINE) IN NACL 1000-0.9 UT/500ML-% IV SOLN
INTRAVENOUS | Status: DC | PRN
  Administered 2021-09-12 (×2): 500 mL

## 2021-09-12 MED ORDER — NICOTINE 14 MG/24HR TD PT24
14.0000 mg | MEDICATED_PATCH | Freq: Every day | TRANSDERMAL | Status: DC
  Administered 2021-09-12 – 2021-09-17 (×6): 14 mg via TRANSDERMAL
  Filled 2021-09-12 (×7): qty 1

## 2021-09-12 MED ORDER — CHLORHEXIDINE GLUCONATE CLOTH 2 % EX PADS
6.0000 | MEDICATED_PAD | Freq: Every day | CUTANEOUS | Status: DC
  Administered 2021-09-12 – 2021-09-17 (×6): 6 via TOPICAL

## 2021-09-12 MED ORDER — FENTANYL CITRATE (PF) 100 MCG/2ML IJ SOLN
INTRAMUSCULAR | Status: DC | PRN
Start: 2021-09-12 — End: 2021-09-12
  Administered 2021-09-12 (×3): 25 ug via INTRAVENOUS

## 2021-09-12 MED ORDER — FUROSEMIDE 10 MG/ML IJ SOLN
60.0000 mg | Freq: Two times a day (BID) | INTRAMUSCULAR | Status: DC
  Administered 2021-09-12 – 2021-09-14 (×4): 60 mg via INTRAVENOUS
  Filled 2021-09-12 (×4): qty 6

## 2021-09-12 MED ORDER — SODIUM CHLORIDE 0.9 % IV SOLN: 250.0000 mL | INTRAVENOUS | Status: DC | PRN

## 2021-09-12 MED ORDER — ALPRAZOLAM 0.25 MG PO TABS
0.2500 mg | ORAL_TABLET | Freq: Two times a day (BID) | ORAL | Status: DC | PRN
  Administered 2021-09-12 – 2021-09-14 (×3): 0.25 mg via ORAL
  Filled 2021-09-12 (×3): qty 1

## 2021-09-12 MED ORDER — FUROSEMIDE 10 MG/ML IJ SOLN
INTRAMUSCULAR | Status: AC
Start: 2021-09-12 — End: ?
  Filled 2021-09-12: qty 8

## 2021-09-12 MED ORDER — MIDAZOLAM HCL 2 MG/2ML IJ SOLN
INTRAMUSCULAR | Status: AC
  Filled 2021-09-12: qty 2

## 2021-09-12 MED ORDER — SODIUM CHLORIDE 0.9 % IV SOLN: INTRAVENOUS | Status: DC

## 2021-09-12 MED ORDER — IPRATROPIUM BROMIDE 0.02 % IN SOLN
0.5000 mg | Freq: Four times a day (QID) | RESPIRATORY_TRACT | Status: DC | PRN
  Administered 2021-09-13 (×2): 0.5 mg via RESPIRATORY_TRACT
  Filled 2021-09-12 (×2): qty 2.5

## 2021-09-12 MED ORDER — APIXABAN 5 MG PO TABS
5.0000 mg | ORAL_TABLET | Freq: Two times a day (BID) | ORAL | Status: DC
Start: 2021-09-12 — End: 2021-09-17
  Administered 2021-09-12 – 2021-09-17 (×11): 5 mg via ORAL
  Filled 2021-09-12 (×11): qty 1

## 2021-09-12 MED ORDER — SODIUM CHLORIDE 0.9% FLUSH
10.0000 mL | INTRAVENOUS | Status: DC | PRN
  Administered 2021-09-17: 10 mL

## 2021-09-12 MED ORDER — AMIODARONE HCL IN DEXTROSE 360-4.14 MG/200ML-% IV SOLN
60.0000 mg/h | INTRAVENOUS | Status: AC
Start: 2021-09-12 — End: 2021-09-12
  Administered 2021-09-12: 60 mg/h via INTRAVENOUS
  Filled 2021-09-12 (×2): qty 200

## 2021-09-12 MED ORDER — AMIODARONE HCL IN DEXTROSE 360-4.14 MG/200ML-% IV SOLN
30.0000 mg/h | INTRAVENOUS | Status: DC
Start: 2021-09-12 — End: 2021-09-14
  Administered 2021-09-12 – 2021-09-14 (×5): 30 mg/h via INTRAVENOUS
  Filled 2021-09-12 (×4): qty 200

## 2021-09-12 MED ORDER — MIDAZOLAM HCL 2 MG/2ML IJ SOLN
INTRAMUSCULAR | Status: DC | PRN
  Administered 2021-09-12 (×3): 1 mg via INTRAVENOUS

## 2021-09-12 MED ORDER — TIOTROPIUM BROMIDE MONOHYDRATE 18 MCG IN CAPS: 18.0000 ug | ORAL_CAPSULE | Freq: Every day | RESPIRATORY_TRACT | Status: DC

## 2021-09-12 MED ORDER — MILRINONE LACTATE IN DEXTROSE 20-5 MG/100ML-% IV SOLN
0.1250 ug/kg/min | INTRAVENOUS | Status: DC
  Administered 2021-09-12 – 2021-09-14 (×4): 0.25 ug/kg/min via INTRAVENOUS
  Filled 2021-09-12 (×4): qty 100

## 2021-09-12 MED ORDER — SODIUM CHLORIDE 0.9% FLUSH: 3.0000 mL | INTRAVENOUS | Status: DC | PRN

## 2021-09-12 MED ORDER — ASPIRIN 81 MG PO CHEW
81.0000 mg | CHEWABLE_TABLET | Freq: Once | ORAL | Status: AC
  Administered 2021-09-12: 81 mg via ORAL
  Filled 2021-09-12: qty 1

## 2021-09-12 MED ORDER — IOHEXOL 350 MG/ML SOLN
INTRAVENOUS | Status: DC | PRN
  Administered 2021-09-12: 115 mL

## 2021-09-12 MED ORDER — AMIODARONE LOAD VIA INFUSION
150.0000 mg | Freq: Once | INTRAVENOUS | Status: AC
  Administered 2021-09-12: 150 mg via INTRAVENOUS
  Filled 2021-09-12: qty 83.34

## 2021-09-12 MED ORDER — SODIUM CHLORIDE 0.9% FLUSH
10.0000 mL | Freq: Two times a day (BID) | INTRAVENOUS | Status: DC
  Administered 2021-09-12 – 2021-09-17 (×10): 10 mL

## 2021-09-12 MED ORDER — FENTANYL CITRATE (PF) 100 MCG/2ML IJ SOLN
INTRAMUSCULAR | Status: AC
  Filled 2021-09-12: qty 2

## 2021-09-12 MED ORDER — VERAPAMIL HCL 2.5 MG/ML IV SOLN
INTRAVENOUS | Status: AC
  Filled 2021-09-12: qty 2

## 2021-09-12 MED ORDER — DULOXETINE HCL 60 MG PO CPEP
60.0000 mg | ORAL_CAPSULE | Freq: Every day | ORAL | Status: DC
  Administered 2021-09-12 – 2021-09-17 (×6): 60 mg via ORAL
  Filled 2021-09-12 (×7): qty 1

## 2021-09-12 MED ORDER — HEPARIN SODIUM (PORCINE) 1000 UNIT/ML IJ SOLN
INTRAMUSCULAR | Status: DC | PRN
Start: 2021-09-12 — End: 2021-09-12
  Administered 2021-09-12: 4000 [IU] via INTRAVENOUS

## 2021-09-12 MED ORDER — LIDOCAINE HCL (PF) 1 % IJ SOLN
INTRAMUSCULAR | Status: DC | PRN
  Administered 2021-09-12: 2 mL
  Administered 2021-09-12: 1 mL

## 2021-09-12 MED ORDER — HEPARIN (PORCINE) IN NACL 1000-0.9 UT/500ML-% IV SOLN
INTRAVENOUS | Status: AC
  Filled 2021-09-12: qty 1000

## 2021-09-12 MED ORDER — ONDANSETRON HCL 4 MG/2ML IJ SOLN: 4.0000 mg | Freq: Four times a day (QID) | INTRAMUSCULAR | Status: DC | PRN

## 2021-09-12 MED ORDER — VERAPAMIL HCL 2.5 MG/ML IV SOLN
INTRAVENOUS | Status: DC | PRN
  Administered 2021-09-12: 10 mL via INTRA_ARTERIAL

## 2021-09-12 MED ORDER — QUETIAPINE FUMARATE ER 50 MG PO TB24
150.0000 mg | ORAL_TABLET | Freq: Every day | ORAL | Status: DC
  Administered 2021-09-12 – 2021-09-16 (×5): 150 mg via ORAL
  Filled 2021-09-12 (×6): qty 3

## 2021-09-12 MED ORDER — TRAZODONE HCL 100 MG PO TABS
100.0000 mg | ORAL_TABLET | Freq: Every day | ORAL | Status: DC
  Administered 2021-09-12 – 2021-09-16 (×5): 100 mg via ORAL
  Filled 2021-09-12 (×5): qty 1

## 2021-09-12 MED ORDER — UMECLIDINIUM BROMIDE 62.5 MCG/ACT IN AEPB
1.0000 | INHALATION_SPRAY | Freq: Every day | RESPIRATORY_TRACT | Status: DC
  Administered 2021-09-13 – 2021-09-14 (×2): 1 via RESPIRATORY_TRACT
  Filled 2021-09-12: qty 7

## 2021-09-12 MED ORDER — DIGOXIN 125 MCG PO TABS
0.1250 mg | ORAL_TABLET | Freq: Every day | ORAL | Status: DC
  Administered 2021-09-12 – 2021-09-17 (×6): 0.125 mg via ORAL
  Filled 2021-09-12 (×6): qty 1

## 2021-09-12 MED ORDER — PRAZOSIN HCL 1 MG PO CAPS
1.0000 mg | ORAL_CAPSULE | Freq: Every day | ORAL | Status: DC
  Administered 2021-09-12 – 2021-09-16 (×5): 1 mg via ORAL
  Filled 2021-09-12 (×6): qty 1

## 2021-09-12 SURGICAL SUPPLY — 13 items
CATH BALLN WEDGE 5F 110CM (CATHETERS) ×1 IMPLANT
CATH INFINITI 5 FR LCB (CATHETERS) ×1 IMPLANT
CATH INFINITI 5 FR RCB (CATHETERS) ×1 IMPLANT
CATH INFINITI 5FR MULTPACK ANG (CATHETERS) ×1 IMPLANT
DEVICE RAD COMP TR BAND LRG (VASCULAR PRODUCTS) ×1 IMPLANT
GLIDESHEATH SLEND SS 6F .021 (SHEATH) ×1 IMPLANT
GUIDEWIRE INQWIRE 1.5J.035X260 (WIRE) IMPLANT
INQWIRE 1.5J .035X260CM (WIRE) ×2
KIT HEART LEFT (KITS) ×2 IMPLANT
PACK CARDIAC CATHETERIZATION (CUSTOM PROCEDURE TRAY) ×2 IMPLANT
SHEATH GLIDE SLENDER 4/5FR (SHEATH) ×1 IMPLANT
SHEATH PROBE COVER 6X72 (BAG) ×1 IMPLANT
TRANSDUCER W/STOPCOCK (MISCELLANEOUS) ×2 IMPLANT

## 2021-09-12 NOTE — Interval H&P Note (Signed)
History and Physical Interval Note: ? ?09/12/2021 ?9:02 AM ? ?John Ball  has presented today for surgery, with the diagnosis of acute systolic heart failure.  The various methods of treatment have been discussed with the patient and family. After consideration of risks, benefits and other options for treatment, the patient has consented to  Procedure(s): ?RIGHT/LEFT HEART CATH AND CORONARY/GRAFT ANGIOGRAPHY (N/A) as a surgical intervention.  The patient's history has been reviewed, patient examined, no change in status, stable for surgery.  I have reviewed the patient's chart and labs.  Questions were answered to the patient's satisfaction.   ? ? ?John Ball ? ? ?

## 2021-09-12 NOTE — Progress Notes (Signed)
?  Transition of Care (TOC) Screening Note ? ? ?Patient Details  ?Name: John Ball ?Date of Birth: 06-01-1941 ? ? ?Transition of Care (TOC) CM/SW Contact:    ?Dorsey Authement, LCSW ?Phone Number: ?09/12/2021, 11:48 AM ? ? ? ?Transition of Care Department [facility]) has reviewed patient and no TOC needs have been identified at this time. We will continue to monitor patient advancement through interdisciplinary progression rounds. Patient will benefit from PT/OT consult for disposition recommendations. If new patient transition needs arise, please place a TOC consult. ?  ?

## 2021-09-12 NOTE — Procedures (Shared)
MRSA swab sent

## 2021-09-12 NOTE — Telephone Encounter (Addendum)
Called patient regarding results. Spoke with patients spouse. Patients spouse had understanding of results.----- Message from Warren Lacy, PA-C sent at 09/10/2021 12:46 PM EST ----- ?Normal kidney function and electrolytes.  Normal hemoglobin/ red blood cell count.  This is a very good result to have before your left and right heart catheterization.   ?

## 2021-09-12 NOTE — Progress Notes (Signed)
Peripherally Inserted Central Catheter Placement ? ?The IV Nurse has discussed with the patient and/or persons authorized to consent for the patient, the purpose of this procedure and the potential benefits and risks involved with this procedure.  The benefits include less needle sticks, lab draws from the catheter, and the patient may be discharged home with the catheter. Risks include, but not limited to, infection, bleeding, blood clot (thrombus formation), and puncture of an artery; nerve damage and irregular heartbeat and possibility to perform a PICC exchange if needed/ordered by physician.  Alternatives to this procedure were also discussed.  Bard Power PICC patient education guide, fact sheet on infection prevention and patient information card has been provided to patient /or left at bedside.   ? ?PICC Placement Documentation  ?PICC Triple Lumen 16/01/09 Right Basilic 38 cm 0 cm (Active)  ?Indication for Insertion or Continuance of Line Vasoactive infusions 09/12/21 2019  ?Exposed Catheter (cm) 0 cm 09/12/21 2019  ?Site Assessment Clean, Dry, Intact 09/12/21 2019  ?Lumen #1 Status Saline locked;Flushed;Blood return noted 09/12/21 2019  ?Lumen #2 Status Saline locked;Flushed;Blood return noted 09/12/21 2019  ?Lumen #3 Status Saline locked;Flushed;Blood return noted 09/12/21 2019  ?Dressing Type Securing device 09/12/21 2019  ?Dressing Status Antimicrobial disc in place;Clean, Dry, Intact 09/12/21 2019  ?Safety Lock Not Applicable 32/35/57 3220  ?Line Care Connections checked and tightened 09/12/21 2019  ?Line Adjustment (NICU/IV Team Only) No 09/12/21 2019  ?Dressing Intervention New dressing 09/12/21 2019  ?Dressing Change Due 09/19/21 09/12/21 2019  ? ? ? ? ? ?Rosalio Macadamia Chenice ?09/12/2021, 8:20 PM ? ?

## 2021-09-12 NOTE — H&P (Addendum)
Advanced Heart Failure Team History and Physical Note   PCP:  Ronnell Freshwater, NP  PCP-Cardiology: Donato Heinz, MD    AHF: Dr. Aundra Dubin ([facility])   Reason for Admission: Acute on Chronic Systolic Heart Failure w/ Low Output, New Atrial Fibrillation w/ RVR    HPI:    81 y/o male w/ h/o CAD s/p CABG in 2004 w/ LIMA-LAD, free RIMA-RI, SVG-OM2, SVG-RPDA, Tobacco use, severe COPD, h/o NSCLC s/p lobectomy in 2012, OSA on CPAP, PVD s/p Rt Fem-Pop bypass, Stage IIIa CKD, HTN, HLD and LE DVT.   Last LHC in 2016 showed CTO of native RCA and SVG RCA, treated medically.   Prior echo in 2019 showed normal LVEF, 50-55%, RV normal.   Seen in ED 1/23 for SOB and diagnosed w/ CHF. Given IV Lasix, recommended admission but declined. Had post ED f/u w/ cardiology and remained symptomatic w/ volume overload. Admission again recommended but he declined. PO diuretics titrated. He was ordered to get repeat echo which showed drop in LVEF to 20-25%, GIIDD, RV severely reduced. Elevated RVSP 59 mmHg. Moderate MR/TR.  He was referred for [facility] today. Angiography showed 2/4 patient grafts, patent LIMA-LAD and RIMA-RI, occluded SVG-PDA (known) and SVG-OM2 (newly occluded, native LCX is occluded). No interventional targets. RHC showed elevated filling pressures and low output. mRAP 9, mPWP 26, FICK CO 3.29L/min, CI 1.68 L/min/BSA. Also noted to be in atrial fibrillation which is new. He is being directly admitted from the cath lab for IV diuresis and inotropic support w/ milrinone.     2D Echo 2019 LVEF 50-55%, RV normal   2D  Echo 08/2021 Left ventricular ejection fraction, by estimation, is 20 to 25%. The left ventricle has severely decreased function. The left ventricle demonstrates global hypokinesis. Left ventricular diastolic parameters are consistent with Grade II diastolic dysfunction (pseudonormalization). Elevated left ventricular end-diastolic pressure. 1. Right ventricular systolic function  is severely reduced. The right ventricular size is mildly enlarged. There is moderately elevated pulmonary artery systolic pressure. The estimated right ventricular systolic pressure is 09.3 mmHg. 2. 3. Left atrial size was severely dilated. The mitral valve is normal in structure. Moderate mitral valve regurgitation. No evidence of mitral stenosis. 4. 5. Tricuspid valve regurgitation is moderate. The aortic valve is tricuspid. Aortic valve regurgitation is mild. Aortic valve sclerosis/calcification is present, without any evidence of aortic stenosis. 6. Aortic dilatation noted. There is mild dilatation of the aortic root, measuring 39 mm. There is mild dilatation of the ascending aorta, measuring 43 mm. 7. The inferior vena cava is normal in size with greater than 50% respiratory variability, suggesting right atrial pressure of 3 mmHg.  R/LHC 09/12/21    Origin lesion is 100% stenosed.   Origin lesion is 100% stenosed.   Mid RCA lesion is 100% stenosed.   Prox RCA lesion is 70% stenosed.   Prox LAD to Mid LAD lesion is 80% stenosed.   Mid LAD lesion is 100% stenosed.   Prox Cx lesion is 100% stenosed.   Ost LM to Mid LM lesion is 70% stenosed.   RPAV lesion is 100% stenosed.   1. Elevated left > right heart filling pressures.  2. Mixed pulmonary venous/pulmonary arterial hypertension.  The PAH component may be due to COPD with group 3 PH.  3. Low cardiac index.  4. Patent LIMA-LAD and free RIMA-ramus.  The RCA and SVG-RCA were known to the occluded.  The SVG-OM is newly-noted to be occluded (native LCX is occluded). No interventional  target.    Right Heart Pressures RHC Procedural Findings: Hemodynamics (mmHg) RA mean 9 RV 59/11 PA 66/36, mean 41 PCWP mean 26 LV 114/18 AO 118/62  Oxygen saturations: PA 62% AO 100%  Cardiac Output (Fick) 3.29  Cardiac Index (Fick) 1.68 PVR 4.5 WU     Review of Systems: [y] = yes, [ ]  = no   General: Weight gain [ ] ; Weight loss [  ]; Anorexia [ ] ; Fatigue [ ] ; Fever [ ] ; Chills [ ] ; Weakness [ ]   Cardiac: Chest pain/pressure [ ] ; Resting SOB [ Y]; Exertional SOB [ Y]; Orthopnea [ ] ; Pedal Edema [ ] ; Palpitations [ ] ; Syncope [ ] ; Presyncope [ ] ; Paroxysmal nocturnal dyspnea[ ]   Pulmonary: Cough [ ] ; Wheezing[ ] ; Hemoptysis[ ] ; Sputum [ ] ; Snoring [ ]   GI: Vomiting[ ] ; Dysphagia[ ] ; Melena[ ] ; Hematochezia [ ] ; Heartburn[ ] ; Abdominal pain [ ] ; Constipation [ ] ; Diarrhea [ ] ; BRBPR [ ]   GU: Hematuria[ ] ; Dysuria [ ] ; Nocturia[ ]   Vascular: Pain in legs with walking [ ] ; Pain in feet with lying flat [ ] ; Non-healing sores [ ] ; Stroke [ ] ; TIA [ ] ; Slurred speech [ ] ;  Neuro: Headaches[ ] ; Vertigo[ ] ; Seizures[ ] ; Paresthesias[ ] ;Blurred vision [ ] ; Diplopia [ ] ; Vision changes [ ]   Ortho/Skin: Arthritis [ ] ; Joint pain [ ] ; Muscle pain [ ] ; Joint swelling [ ] ; Back Pain [ ] ; Rash [ ]   Psych: Depression[ ] ; Anxiety[ ]   Heme: Bleeding problems [ ] ; Clotting disorders [ ] ; Anemia [ ]   Endocrine: Diabetes [ ] ; Thyroid dysfunction[ ]    Home Medications Prior to Admission medications   Medication Sig Start Date End Date Taking? Authorizing Provider  acetaminophen (TYLENOL) 500 MG tablet Take 1,000 mg by mouth every 6 (six) hours as needed for moderate pain or headache.    Yes [provider]  albuterol (PROVENTIL HFA;VENTOLIN HFA) 108 (90 BASE) MCG/ACT inhaler Inhale 1 puff into the lungs every 6 (six) hours as needed for wheezing or shortness of breath.   Yes [provider]  albuterol (PROVENTIL) (2.5 MG/3ML) 0.083% nebulizer solution Take 2.5 mg by nebulization every 6 (six) hours as needed for wheezing or shortness of breath. 03/15/18  Yes [provider]  aspirin EC 81 MG tablet Take 1 tablet (81 mg total) by mouth daily. Swallow whole. 07/27/21  Yes Donato Heinz, MD  cholecalciferol (VITAMIN D) 25 MCG (1000 UNIT) tablet Take 1,000 Units by mouth in the morning. 04/20/21  Yes [provider]  CYMBALTA 60 MG capsule Take 60 mg by mouth in the morning. 10/31/19  Yes [provider]  furosemide (LASIX) 20 MG tablet Take 40 mg by mouth in the morning.   Yes [provider]  nitroGLYCERIN (NITROSTAT) 0.4 MG SL tablet Place 1 tablet (0.4 mg total) under the tongue every 5 (five) minutes as needed for chest pain. 08/28/17  Yes Leonie Man, MD  prazosin (MINIPRESS) 1 MG capsule Take 1 mg by mouth at bedtime.   Yes [provider]  QUEtiapine Fumarate (SEROQUEL XR) 150 MG 24 hr tablet Take 150 mg by mouth at bedtime.   Yes [provider]  ramipril (ALTACE) 2.5 MG capsule Take 2.5 mg by mouth every morning.    Yes [provider]  Tiotropium Bromide Monohydrate 2.5 MCG/ACT AERS Inhale 2 puffs into the lungs in the morning.   Yes [provider]  traZODone (DESYREL) 100 MG tablet Take 100 mg  by mouth at bedtime.   Yes [provider]  Grant Ruts INHUB 250-50 MCG/ACT AEPB Inhale 1 puff into the lungs 2 (two) times daily. 07/25/21  Yes [provider]  furosemide (LASIX) 40 MG tablet Take 1 tablet (40 mg total) by mouth daily. Patient not taking: Reported on 09/07/2021 07/27/21   Donato Heinz, MD  OXYGEN Inhale 4 L into the lungs continuous. Uses 4L at bedtime and if needed throughout the day -- intolerant to CPAP    [provider]    Past Medical History: Past Medical History:  Diagnosis Date   Anxiety    Arthritis    Complication of anesthesia    hx prolonged post op oxygen dependent/  hx hallucinations for 2 day post op lobectomy 2012   COPD, severe ([place])    Depression    Essential hypertension    Hearing loss of both ears    does wear his hearing aids   History of arterial bypass of lower extremity    RIGHT FEM-POP   History of CHF (congestive heart failure)    History of DVT of lower extremity    History of panic attacks    Hyperlipidemia with target LDL less than 70    Left  main coronary artery disease 2004   a) Severe LM CAD 2004 -->s/p  cabg x4 (LIMA-LAD, free RIMA-RI, SVG-OM 2, SVG-RPDA); b) 06/2014: Abnormal Myoview --> c)cardiac Cath Jan 2016: pLM 70%, pLAD 40-50%- patent LIMA-LAD.  RI -competitive flow noted from RIMA-RI graft, circumflex, normal caliber with moderate OM1.  OM 2 occluded.  SVG-OM 2 patent. 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    does not wear his dentures   Nocturia    OSA (obstructive sleep apnea)    uses O2  via [place]  --  intolerate to CPAP   Peripheral arterial occlusive disease ([place])    2011   a) Gore-Tex graft right AK popA-BK popA --> b) 2012: thrombosed graft --> thrombectomy with dacron patch = 1 V runnof via Peroneal; c) 01/2014: R femoropopliteal bypass with left femoral vein;; followed by dr Early--  per last dopplers 06-28-2014 no change right graft,  50-74% stenosis common and left mid superficial femoral artery   PTSD (post-traumatic stress disorder)    anxiety attack's---  [place] [employer]   Recurrent productive cough    SMOKER'S COUGH   S/P CABG x 4 07/2002   a) LIMA-LAD, freeRIMA-RI, SVG-OM2, SVG-RPDA ; --> December 2015 Myoview with mostly fixed inferior defect --> cardiac cath January 2016 revealed CTO of native RCA and SVG RCA.  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--- a) 04/2011: R L Lobectomy & med node dissection ([id number]) - [facility] [place], [place]) w/o post-op Rx; b) CT-A Chest Jan 2013: R pl effusion, R hilar LAN (3.3 cm x 2.8 cm & 2.2 cm x 1.3 cm); c) 2/'13 PET-CT Chest: Bilat Hilar LAN, no distant Mets  d) CT chest 9/'14: Stable shotty hilar nodes bilaterally w/ no pathologic sized LAD or suspicious Pulm nodule; ;;  (oncologist at   Wears glasses     Past Surgical History: Past  Surgical History:  Procedure Laterality Date   ABDOMINAL AORTOGRAM N/A 11/12/2017  Procedure: ABDOMINAL AORTOGRAM;  Surgeon: Waynetta Sandy, MD;  Location: [facility];  Service: Cardiovascular;  Laterality: N/A;   ABDOMINAL AORTOGRAM W/LOWER EXTREMITY Left 02/10/2018   Procedure: ABDOMINAL AORTOGRAM W/LOWER EXTREMITY;  Surgeon: Serafina Mitchell, MD;  Location: [facility];  Service: Cardiovascular;  Laterality: Left;   APPLICATION OF WOUND VAC Left 12/03/2017   Procedure: EXCHANGE OF WOUND VAC LEFT GROIN,;  Surgeon: Rosetta Posner, MD;  Location: [facility];  Service: Vascular;  Laterality: Left;   CARDIAC CATHETERIZATION  07/13/2002   Ischemia RCA region on Cardiolite// 60-70% ostial left main, 50% midCx, 60-70% mid RI,  70% mRCA, 90% JeNu and crux 75% RCA ,  95% PLA//  severe LM and 3Vessel CAD, perserved LV, ef 60%   COLONOSCOPY  2008    WNL   CORONARY ARTERY BYPASS GRAFT  07/27/2002   LIMA-LAD, freeRIMA-RI,SVG-OM2, SVG-rPDA; Dr. Roxan Hockey   CYSTOSCOPY W/ URETERAL STENT PLACEMENT N/A 02/21/2015   Procedure: BILATERAL RETROGRADE PYELOGRAM;  Surgeon: Festus Aloe, MD;  Location: [facility];  Service: Urology;  Laterality: N/A;   ENDARTERECTOMY FEMORAL Left 11/17/2017   Procedure: LEFT Femoral Endarterectomy with Patch Angioplasty;  Surgeon: Rosetta Posner, MD;  Location: [facility];  Service: Vascular;  Laterality: Left;   EYE SURGERY Left 05/2012   FEMORAL ARTERY - POPLITEAL ARTERY BYPASS GRAFT Right 10-24-2008   dr early   w/ GoreTex graft   FEMORAL-POPLITEAL BYPASS GRAFT Left 02/11/2018   Procedure: BYPASS GRAFT LOWER EXTREMITY;  Surgeon: Rosetta Posner, MD;  Location: [facility];  Service: Vascular;  Laterality: Left;   FEMORAL-TIBIAL BYPASS GRAFT Right 01/10/2014   Procedure: Right Femoral to Posterior Tibial Bypass Graft using Left Leg Vein, Thrombectomy Right Common Femoral and Profunda of Leg . ;  Surgeon: Rosetta Posner, MD;  Location: [facility];  Service:  Vascular;  Laterality: Right;   HYDROCELE EXCISION Left    LEFT ANKLE SURGERY  1990's   LEFT HEART CATHETERIZATION WITH CORONARY/GRAFT ANGIOGRAM N/A 07/12/2014   Procedure: LEFT HEART CATHETERIZATION WITH Beatrix Fetters;  Surgeon: Troy Sine, MD;  Location: [facility];  Service: Cardiovascular;  Laterality: N/A;  pLM 70%, pLAD 40-50%- patent LIMA-LAD.  RI -competitive flow noted from fRIMA-RI graft, circumflex, normal caliber with moderate OM1.  OM 2 occluded.  SVG-OM 2 patent. RCA CTO after large RVM, faint R-R and LAD septal-PDA collateral   LOWER EXTREMITY ANGIOGRAPHY Bilateral 11/12/2017   Procedure: LOWER EXTREMITY ANGIOGRAPHY - Left Lower;  Surgeon: Waynetta Sandy, MD;  Location: [facility];  Service: Cardiovascular;  Laterality: Bilateral;   NM MYOVIEW LTD  11/'15; 3/'19, 5/'20   a) Mod sized, mod intensity/partially reversible inferior defect c/w  prior infarct mild/mod peri-infarct ischemia.  Inferoseptal HK.  INTERMEDIATE RISK. --> Cath showed CTO of Native RCA & SVG-rPDA.; b) EF ~42%. Inferior-Inferoseptal defect - likely old scar - INTERMEDIATE RISK b/c low EF (Echo EF 50-55%) - med Rx; c) [place] -by report - Non-ischemic.   PERIPHERAL VASCULAR INTERVENTION Left 02/10/2018   Procedure: PERIPHERAL VASCULAR INTERVENTION;  Surgeon: Serafina Mitchell, MD;  Location: [facility];  Service: Cardiovascular;  Laterality: Left;  left common iliac stent, attempted left SFA stent   RIGHT ABOVE KNEE POPLITEAL GRAFT TO BELOW KNEE POPLITEAL ARTERY BYPASS WITH REVERSE SAPHENOUS VEIN  03/02/2010   THROMBECTOMY OF RIGHT FEMORAL TO ABOVE KNEE POPLITEAL GORETEX GRAFT ANGIOPLASTY OF GORETEX AND SAPHENOUS VEIN JUNCTION AND ABOVE KNEE POPLITEAL ARTERY  10-06-2010   DR EARLY   TONSILLECTOMY     TRANSTHORACIC ECHOCARDIOGRAM  09/2017   EF 50-55%.  More accurate - Mild inferolateral HK (c/w known RCA occlusion).  GR 1 DD.  Mild LA dilation mildly increased PA  pressures with mild RV/RA dilation.   TRANSURETHRAL RESECTION OF BLADDER TUMOR N/A 02/21/2015   Procedure: TRANSURETHRAL RESECTION OF BLADDER TUMOR (TURBT);  Surgeon: Festus Aloe, MD;  Location: [facility];  Service: Urology;  Laterality: N/A;   VIDEO ASSISTED THORACOSCOPY (VATS)/WEDGE RESECTION Right 05-01-2011    FORSYTH   LOWER LOBECTOMY W/  NODE DISSECTION   WOUND EXPLORATION Left 11/28/2017   Procedure: WOUND EXPLORATION GROIN, WASHOUT AND WOUND VAC APPLICATION;  Surgeon: Conrad , MD;  Location: [facility];  Service: Vascular;  Laterality: Left;    Family History:  Family History  Problem Relation Age of Onset   Other Mother 31       Abdominal Aortic Aneurysm    Stroke Mother    Alzheimer's disease Father 64   Stroke Father     Social History: Social History   Socioeconomic History   Marital status: Married    Spouse name: Not on file   Number of children: 2   Years of education: Not on file   Highest education level: Not on file  Occupational History   Not on file  Tobacco Use   Smoking status: Every Day    Packs/day: 1.00    Years: 60.00    Pack years: 60.00    Types: Cigarettes    Start date: 09/14/1957   Smokeless tobacco: Never   Tobacco comments:    1/4 ppk 09/28/20  Vaping Use   Vaping Use: Never used  Substance and Sexual Activity   Alcohol use: No   Drug use: No   Sexual activity: Not on file  Other Topics Concern   Not on file  Social History Narrative   He is married with 2 children. Does not exercise regularly.   He and his wife have been recently living in [place], [place] (but just moved back to [place], [place] in November 2018).   He still smokes about a half-pack a day and is not interested in quitting. No alcohol or recreational drug use.      I confirmed with him and his wife that he does have a DNR order signed and displayed.      Social Determinants of Health   Financial Resource Strain: Not on file  Food  Insecurity: Not on file  Transportation Needs: Not on file  Physical Activity: Not on file  Stress: Not on file  Social Connections: Not on file    Allergies:  Allergies  Allergen Reactions   Aspirin Other (See Comments)    Avoids due to severe bruises   Crestor [Rosuvastatin Calcium] Other (See Comments)    myalgia   Lipitor [Atorvastatin Calcium] Other (See Comments)    myalgia   Zetia [Ezetimibe] Other (See Comments)    myalgia   Zocor [Simvastatin - High Dose] Other (See Comments)    myalgia   Aripiprazole Other (See Comments)    Altered mental status   Effexor [Venlafaxine Hydrochloride] Itching   Morphine And Related Itching    Objective:    Vital Signs:   Temp:  [97.8 F (36.6 C)] 97.8 F (36.6 C) (03/08 0658) Pulse Rate:  [81-133] 133 (03/08 1035) Resp:  [14-26] 15 (03/08 1035) BP: (104-142)/(64-114) 108/67 (03/08 1035) SpO2:  [72 %-100 %]  72 % (03/08 1035) Weight:  [73.9 kg] 73.9 kg (03/08 0658)   Filed Weights   09/12/21 0658  Weight: 73.9 kg     Physical Exam     General:  elderly WM. No respiratory difficulty HEENT: Normal Neck: Supple. JVD 9 cm. Carotids 2+ bilat; no bruits. No lymphadenopathy or thyromegaly appreciated. Cor: PMI nondisplaced. Irregularly irregular rhythm and rate. 2/6 MR/TR murmur  Lungs: decreased BS at the bases bilatearlly  Abdomen: Soft, nontender, nondistended. No hepatosplenomegaly. No bruits or masses. Good bowel sounds. Extremities: No cyanosis, clubbing, rash, edema Neuro: Alert & oriented x 3, cranial nerves grossly intact. moves all 4 extremities w/o difficulty. Affect pleasant.   Telemetry   Afib 90s-low 100s   EKG   12 lead pending   Labs     Basic Metabolic Panel: Recent Labs  Lab 09/06/21 1159  NA 143  K 4.9  CL 99  CO2 25  GLUCOSE 122*  BUN 25  CREATININE 1.07  CALCIUM 10.0    Liver Function Tests: No results for input(s): AST, ALT, ALKPHOS, BILITOT, PROT, ALBUMIN in the last 168  hours. No results for input(s): LIPASE, AMYLASE in the last 168 hours. No results for input(s): AMMONIA in the last 168 hours.  CBC: Recent Labs  Lab 09/06/21 1159  WBC 4.9  HGB 15.3  HCT 44.4  MCV 104*  PLT 142*    Cardiac Enzymes: No results for input(s): CKTOTAL, CKMB, CKMBINDEX, TROPONINI in the last 168 hours.  BNP: BNP (last 3 results) Recent Labs    07/13/21 1210  BNP 1,058.3*    ProBNP (last 3 results) No results for input(s): PROBNP in the last 8760 hours.   CBG: No results for input(s): GLUCAP in the last 168 hours.  Coagulation Studies: No results for input(s): LABPROT, INR in the last 72 hours.  Imaging: [place] EKG SITE RITE  Result Date: 09/12/2021 If Site Rite image not attached, placement could not be confirmed due to current cardiac rhythm.    Patient Profile   81 y/o male w/ h/o CAD s/p CABG in 2004 w/ LIMA-LAD, freeRIMA-RI, SVG-OM2, SVG-RPDA w/ known CTO of of native RCA and SVG RCA on cath in 2016, Tobacco use, severe COPD, h/o NSCLC s/p lobectomy in 2012, OSA on CPAP, PVD s/p Rt Fem-Pop bypass, Stage IIIa CKD, HTN, HLD, LE DVT and newly diagnosed biventricular heart failure and atrial fibrillation, referred for outpatient R/LHC today showing elevated filling pressures and low output. He is being directly admitted for IV diuresis and inotropic support w/ milrinone.    Assessment/Plan   Acute on Chronic Biventricular Heart Failure - Echo 2019: LVEF 50-55%, RV normal  - Developed new HF symptoms 1/23. Echo 2/23 LVEF 20-25%, RV severely reduced - R/LHC today w/ elevated filling pressures and low output, mRAP 9, mPWP 26,  FICK CO/CI 3.29/1.68. PAPi normal  - Start milrinone 0.25 mg/kg/min - Place PICC to follow co-ox and CVP  - IV Lasix 60 mg bid. Monitor response  - Start digoxin 0.125 mg daily  - Gradual initiation of GDMT   2. Atrial Fibrillation w/ RVR - new diagnosis  - start amio gtt - start Eliquis 5 mg bid  - Plan TEE/DCCV if no  chemical conversion - Keep K > 4.0 and Mg >2.0  - CPAP for OSA   3. CAD  - s/p CABG x 4 in 2004 (LIMA-LAD, freeRIMA-RI, SVG-OM2, SVG-RPDA) - LHC 2016 w/ CTO of of native RCA and SVG RCA, medically management  -  LHC this admit 2/4 patent grafts, LIMA-LAD and RIMA-RI patent.  - SVG-RCA occluded (known) - SVG-OM2 newly occluded. Native LCx occluded - no interventional targets  - continue medical therapy   4. CKD Stage IIIa - baseline SCr ~1.0 - monitor w/ diuresis  - support CO w/ milrinone   5. HTN - controlled currently - will plan GDMT initiation   6. HLD - intolerant to statins/Zetia  - consider PCSK9i  7. PVD - s/p Rt Fem-pop bypass - followed by [facility]   8. H/o Lung Cancer - s/p lobectomy in 2012  7. COPD - smoking cessation advised  - followed by Dr. Halford Chessman  8. OSA - CPAP nightly   9. Moderate MR/TR - likely functional from dilated CM - HF optimization/diuresis per above   10. Pulmonary HTN  - RHC PAP 66/36 (41), PVR 4.5 WU - Mixed pulmonary venous/pulmonary arterial hypertension - The PAH component may be due to COPD with group 3 PH.  - diuresis/O2/CPAP    Lyda Jester, PA-C 09/12/2021, 10:41 AM  Advanced Heart Failure Team Pager [phone] (M-F; 7a - 5p)  Please contact [facility] for night-coverage after hours (4p -7a ) and weekends on [email or web address]   Patient seen with PA, agree with the above note.   Patient was admitted from cath lab with findings as noted above.  He has severe COPD on home oxygen 4 L as well as LV function newly noted to be depressed.  He has been in atrial fibrillation since at least 1/23.   General: NAD Neck: JVP 10 cm, no thyromegaly or thyroid nodule.  Lungs: Distant BS CV: Nondisplaced PMI.  Heart irregular S1/S2, no S3/S4, no murmur.  No peripheral edema.  No carotid bruit.  Normal pedal pulses.  Abdomen: Soft, nontender, no hepatosplenomegaly, no distention.  Skin: Intact without lesions or rashes.  Neurologic: Alert  and oriented x 3.  Psych: Normal affect. Extremities: No clubbing or cyanosis.  HEENT: Normal.   1. Acute on chronic systolic CHF: Echo in 2/42 with EF 20-25%, mildly dilated and severely dysfunctional RV, moderate MR.  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  Since that time, it appears that he has lost an SVG-OM and the LCx is known to be occluded.  He is also in atrial fibrillation which likely contributes to HF as well.  RHC today showed elevated L>R sided filling pressures, PAPI adequate.  CI was low at 1.68.   - Will start milrinone 0.25 mcg/kg/min for now.  - Gently diurese, start with Lasix 60 mg IV bid - Place PICC to follow CVP and co-ox.  - Start digoxin 0.125 - Will need conversion to NSR.  2. Atrial fibrillation: He has been in atrial fibrillation since at least 1/23.  Rate has not been markedly high.  Last ECG with NSR was in 8/22.  AF likely has worsened CHF.  Need to get him back in NSR.  - Start amiodarone gtt.  Not good long-term option with severe COPD but will use at least in the short term.  - Start Eliquis 5 mg bid.  - If he does not convert on his own, will plan TEE-guided DCCV on Friday (hopefully after weaning off milrinone and diuresing him).   3. CAD: Cath today showed occluded RCA and SVG-RCA (known from prior), patent LIMA-LAD, occluded LCx, and occluded SVG-OM (new finding). Free RIMA-ramus was patent.  New ostial occlusion of SVG-OM, no interventional option.  Plan medical management, no chest pain.  -  No ASA given Eliquis use.  - He has not tolerated statins or Zetia, at discharge would refer to pharmacy clinic for [place].  4. PAD: S/p right fem-pop bypass.  5. H/o lung cancer: Lobectomy 2012.  6. COPD: On 4L home oxygen. He is an active smoker though wife says he has cut back.  - nicotine patches in hospital. 7. OSA: Use CPAP.  8. 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  The  PAH component may be due to COPD with group 3 PH.  9. PTSD: Continue home meds.   Loralie Champagne 09/12/2021 2:13 PM

## 2021-09-12 NOTE — Progress Notes (Signed)
TR BAND REMOVAL ? ?LOCATION:    Right radial ? ?DEFLATED PER PROTOCOL:    Yes.   ? ?TIME BAND OFF / DRESSING APPLIED:    1340 A clean dry dressing applied with gauze and tegaderm/[place]  ? ?SITE UPON ARRIVAL:    Level 0 ? ?SITE AFTER BAND REMOVAL:    Level 0 ? ?CIRCULATION SENSATION AND MOVEMENT:    Within Normal Limits   Yes.   ? ?COMMENTS:   Care instructions given to patient  ?

## 2021-09-12 NOTE — Progress Notes (Addendum)
Pt brought back from waiting room with respirations in the 40s.  Pt using all accessory muscles.  Pt was sitting forward using pursed lip breathing.  Pt's color was grey and he was diaphoretic. Pt was not on any O2. His O2 sats were 82.  Pt given 4 L O2 by [place]. Clients wife states his O2 is in the car and that they live 1 hour away and only have one portable tank. Pt returned to his baseline after rest and 10 min on O2.  Resp rate is 20. Sats 97. Color pink.  Social work called to see if they could help get access to more portable tanks.  Dr Aundra Dubin and Gwynne Edinger notified ?

## 2021-09-12 NOTE — TOC Benefit Eligibility Note (Signed)
Patient Advocate Encounter ?  ?Insurance verification completed.   ?  ?The patient is currently admitted and upon discharge could be taking APIXABAN. ?  ?The current 30 day co-pay is, $38.  ? ?The patient is insured through [employer]. ? ? ?  ? ?

## 2021-09-12 NOTE — Progress Notes (Signed)
Patient refused CPAP for the night  

## 2021-09-13 ENCOUNTER — Inpatient Hospital Stay (HOSPITAL_COMMUNITY): Payer: Medicare Other

## 2021-09-13 ENCOUNTER — Encounter (HOSPITAL_COMMUNITY): Payer: Self-pay | Admitting: Cardiology

## 2021-09-13 ENCOUNTER — Other Ambulatory Visit (HOSPITAL_COMMUNITY): Payer: Self-pay

## 2021-09-13 LAB — BASIC METABOLIC PANEL
Anion gap: 7 (ref 5–15)
BUN: 22 mg/dL (ref 8–23)
CO2: 31 mmol/L (ref 22–32)
Calcium: 9 mg/dL (ref 8.9–10.3)
Chloride: 101 mmol/L (ref 98–111)
Creatinine, Ser: 1.25 mg/dL — ABNORMAL HIGH (ref 0.61–1.24)
GFR, Estimated: 59 mL/min — ABNORMAL LOW (ref >60)
Glucose, Bld: 103 mg/dL — ABNORMAL HIGH (ref 70–99)
Potassium: 3.8 mmol/L (ref 3.5–5.1)
Sodium: 139 mmol/L (ref 135–145)

## 2021-09-13 LAB — COOXEMETRY PANEL
Carboxyhemoglobin: 1.8 % — ABNORMAL HIGH (ref 0.5–1.5)
Methemoglobin: 3.3 % — ABNORMAL HIGH (ref 0.0–1.5)
O2 Saturation: 72.4 %
Total hemoglobin: 12.8 g/dL (ref 12.0–16.0)

## 2021-09-13 LAB — MAGNESIUM: Magnesium: 1.9 mg/dL (ref 1.7–2.4)

## 2021-09-13 LAB — CBC
HCT: 36.8 % — ABNORMAL LOW (ref 39.0–52.0)
Hemoglobin: 12.1 g/dL — ABNORMAL LOW (ref 13.0–17.0)
MCH: 36 pg — ABNORMAL HIGH (ref 26.0–34.0)
MCHC: 32.9 g/dL (ref 30.0–36.0)
MCV: 109.5 fL — ABNORMAL HIGH (ref 80.0–100.0)
Platelets: 84 10*3/uL — ABNORMAL LOW (ref 150–400)
RBC: 3.36 MIL/uL — ABNORMAL LOW (ref 4.22–5.81)
RDW: 15.2 % (ref 11.5–15.5)
WBC: 3.8 10*3/uL — ABNORMAL LOW (ref 4.0–10.5)
nRBC: 0 % (ref 0.0–0.2)

## 2021-09-13 MED ORDER — POTASSIUM CHLORIDE CRYS ER 20 MEQ PO TBCR
40.0000 meq | EXTENDED_RELEASE_TABLET | Freq: Once | ORAL | Status: AC
  Administered 2021-09-13: 14:00:00 40 meq via ORAL
  Filled 2021-09-13: qty 2

## 2021-09-13 MED ORDER — LEVALBUTEROL HCL 0.63 MG/3ML IN NEBU
0.6300 mg | INHALATION_SOLUTION | Freq: Once | RESPIRATORY_TRACT | Status: AC
  Administered 2021-09-13: 20:00:00 0.63 mg via RESPIRATORY_TRACT
  Filled 2021-09-13: qty 3

## 2021-09-13 MED ORDER — SPIRONOLACTONE 12.5 MG HALF TABLET
12.5000 mg | ORAL_TABLET | Freq: Every day | ORAL | Status: DC
  Administered 2021-09-13: 11:00:00 12.5 mg via ORAL
  Filled 2021-09-13: qty 1

## 2021-09-13 MED ORDER — ACETAMINOPHEN 325 MG PO TABS
650.0000 mg | ORAL_TABLET | ORAL | Status: DC | PRN
  Administered 2021-09-16 – 2021-09-17 (×2): 650 mg via ORAL
  Filled 2021-09-13 (×2): qty 2

## 2021-09-13 MED ORDER — DAPAGLIFLOZIN PROPANEDIOL 10 MG PO TABS
10.0000 mg | ORAL_TABLET | Freq: Every day | ORAL | Status: DC
  Administered 2021-09-13 – 2021-09-14 (×2): 10 mg via ORAL
  Filled 2021-09-13 (×2): qty 1

## 2021-09-13 MED ORDER — HYDRALAZINE HCL 20 MG/ML IJ SOLN: 10.0000 mg | INTRAMUSCULAR | Status: AC | PRN

## 2021-09-13 MED ORDER — FUROSEMIDE 10 MG/ML IJ SOLN: 80.0000 mg | Freq: Once | INTRAMUSCULAR | Status: AC

## 2021-09-13 MED ORDER — LABETALOL HCL 5 MG/ML IV SOLN: 10.0000 mg | INTRAVENOUS | Status: AC | PRN

## 2021-09-13 MED ORDER — SODIUM CHLORIDE 0.9% FLUSH: 3.0000 mL | INTRAVENOUS | Status: DC | PRN

## 2021-09-13 MED ORDER — FUROSEMIDE 10 MG/ML IJ SOLN
INTRAMUSCULAR | Status: AC
  Administered 2021-09-13: 21:00:00 80 mg via INTRAVENOUS
  Filled 2021-09-13: qty 8

## 2021-09-13 MED ORDER — ORAL CARE MOUTH RINSE
15.0000 mL | Freq: Two times a day (BID) | OROMUCOSAL | Status: DC
  Administered 2021-09-13 – 2021-09-17 (×6): 15 mL via OROMUCOSAL

## 2021-09-13 MED ORDER — MAGNESIUM SULFATE 2 GM/50ML IV SOLN
2.0000 g | Freq: Once | INTRAVENOUS | Status: AC
  Administered 2021-09-13: 14:00:00 2 g via INTRAVENOUS
  Filled 2021-09-13: qty 50

## 2021-09-13 MED ORDER — SODIUM CHLORIDE 0.9% FLUSH: 3.0000 mL | Freq: Two times a day (BID) | INTRAVENOUS | Status: DC

## 2021-09-13 MED ORDER — SODIUM CHLORIDE 0.9 % IV SOLN: 250.0000 mL | INTRAVENOUS | Status: DC | PRN

## 2021-09-13 NOTE — Plan of Care (Signed)
?  Problem: Education: ?Goal: Ability to demonstrate management of disease process will improve ?Outcome: Progressing ?  ?Problem: Education: ?Goal: Ability to verbalize understanding of medication therapies will improve ?Outcome: Progressing ?  ?Problem: Cardiac: ?Goal: Ability to achieve and maintain adequate cardiopulmonary perfusion will improve ?Outcome: Progressing ?  ?

## 2021-09-13 NOTE — Significant Event (Signed)
Rapid Response Event Note  ? ?Reason for Call :  ?Resp distress ? ?RN gave Atrovent breathing tx, xanax, and seroquel prior to RRT arrival ? ?Initial Focused Assessment:  ?Pt sitting on side of be in resp distress, +WOB, +accessory muscle use. Pt denies chest pain. Lungs wheezy t/o with crackles in the bases. Skin warm and dry. ? ?HR-110(Afib), BP-138/92, RR-30, SpO2-91% on 7L HFNC. ? ? ?Interventions:  ?Xopenex tx ?80mg  lasix IV ?PCXR-1. Cardiomegaly with mild pulmonary edema pattern. 2. Small bilateral pleural effusions, new on the left. 3. Can not exclude infection in the lung bases. ?Bipap ? ?Plan of Care:  ?Give xopenex/lasix and monitor response. Place pt on bipap. Continue to monitor pt closely. Call RRT if further assistance needed.  ? ?Event Summary:  ? ?MD Notified: Dr. Renella Cunas notified and came to bedside. ?Call Time:2002 ?Arrival Time:2004 ?End Time:2020 ? ?Dillard Essex, RN ?

## 2021-09-13 NOTE — TOC Benefit Eligibility Note (Addendum)
Patient Advocate Encounter ?  ?Insurance verification completed.   ?  ?The patient is currently admitted and upon discharge could be taking FARXIGA-and will require a PA. ?  ?The patient is currently admitted and upon discharge could be taking JARDIANCE. ?  ?The current 30 day co-pay is, $38.  ? ?The patient is currently admitted and upon discharge could be taking ELIQUIS. ?  ?The current 30 day co-pay is, $38.  ? ?The patient is insured through [employer]. ? ? ?  ? ?

## 2021-09-13 NOTE — Progress Notes (Addendum)
Advanced Heart Failure Rounding Note  PCP-Cardiologist: Donato Heinz, MD   Subjective:   3/8- Started milrinone 0.25 mcg. PICC placed. CO-OX 72%. Diuresing with IV lasix. Ony receive 1 dose of IV lasix. Placed on  amio drip for A fib.   RHC/LHC  1. Elevated left > right heart filling pressures. PCWP 26>RA 9  2. Mixed pulmonary venous/pulmonary arterial hypertension.  The PAH component may be due to COPD with group 3 PH.  3. Low cardiac index.  4. Patent LIMA-LAD and free RIMA-ramus.  The RCA and SVG-RCA were known to the occluded.  The SVG-OM is newly-noted to be occluded (native LCX is occluded). No interventional target.    Sluggish urine output.   Hungry. Denies SOB.   Objective:   Weight Range: 75.7 kg Body mass index is 22.63 kg/m.   Vital Signs:   Temp:  [97.7 F (36.5 C)-98 F (36.7 C)] 98 F (36.7 C) (03/09 0411) Pulse Rate:  [26-133] 93 (03/09 0411) Resp:  [11-26] 20 (03/09 0411) BP: (74-142)/(35-114) 103/68 (03/09 0411) SpO2:  [72 %-100 %] 93 % (03/09 0411) Weight:  [75.7 kg-77.4 kg] 75.7 kg (03/09 0411) Last BM Date : 09/12/21  Weight change: Filed Weights   09/12/21 0658 09/12/21 1510 09/13/21 0411  Weight: 73.9 kg 77.4 kg 75.7 kg    Intake/Output:   Intake/Output Summary (Last 24 hours) at 09/13/2021 0735 Last data filed at 09/13/2021 0525 Gross per 24 hour  Intake 1085.42 ml  Output 650 ml  Net 435.42 ml      Physical Exam   CVP 4-5  General:   No resp difficulty HEENT: Normal Neck: Supple. JVP flat. Carotids 2+ bilat; no bruits. No lymphadenopathy or thyromegaly appreciated. Cor: PMI nondisplaced. Regular rate & rhythm. No rubs, gallops or murmurs. Lungs: Coarse throughout. On 4 liters [place].  Abdomen: Soft, nontender, nondistended. No hepatosplenomegaly. No bruits or masses. Good bowel sounds. Extremities: No cyanosis, clubbing, rash, edema. RUE PICC Neuro: Alert & orientedx3, cranial nerves grossly intact. moves all 4 extremities  w/o difficulty. Affect pleasant   Telemetry  SR 70s -100s but went back in A fib this morning. personally checked.   EKG  N/A  Labs    CBC Recent Labs    09/12/21 1624 09/13/21 0500  WBC 4.0 3.8*  HGB 14.3 12.1*  HCT 43.3 36.8*  MCV 109.3* 109.5*  PLT 117* 84*   Basic Metabolic Panel Recent Labs    09/12/21 1624 09/13/21 0500  NA 137 139  K 3.9 3.8  CL 98 101  CO2 31 31  GLUCOSE 124* 103*  BUN 24* 22  CREATININE 1.20 1.25*  CALCIUM 9.0 9.0  MG  --  1.9   Liver Function Tests Recent Labs    09/12/21 1624  AST 18  ALT 14  ALKPHOS 45  BILITOT 0.5  PROT 6.1*  ALBUMIN 3.7   No results for input(s): LIPASE, AMYLASE in the last 72 hours. Cardiac Enzymes No results for input(s): CKTOTAL, CKMB, CKMBINDEX, TROPONINI in the last 72 hours.  BNP: BNP (last 3 results) Recent Labs    07/13/21 1210 09/12/21 1624  BNP 1,058.3* 2,341.0*    ProBNP (last 3 results) No results for input(s): PROBNP in the last 8760 hours.   D-Dimer No results for input(s): DDIMER in the last 72 hours. Hemoglobin A1C No results for input(s): HGBA1C in the last 72 hours. Fasting Lipid Panel No results for input(s): CHOL, HDL, LDLCALC, TRIG, CHOLHDL, LDLDIRECT in the last 72  hours. Thyroid Function Tests No results for input(s): TSH, T4TOTAL, T3FREE, THYROIDAB in the last 72 hours.  Invalid input(s): FREET3  Other results:   Imaging    DG Chest 2 View  Result Date: 09/12/2021 CLINICAL DATA:  CHF. EXAM: CHEST - 2 VIEW COMPARISON:  Chest x-ray 07/13/2021. CT chest 07/30/2021. FINDINGS: Loculated right pleural effusion in the lower chest appears unchanged. Small layering right pleural effusion is also unchanged. There is new blunting of the left costophrenic angle. There are new patchy airspace opacities in the retrocardiac region. Cardiomegaly persists. No pneumothorax. Patient is status post cardiac surgery. No acute fractures are seen. IMPRESSION: 1. New small left pleural  effusion. 2. New left basilar airspace disease. 3. Stable small right pleural effusion, partially loculated. Electronically Signed   By: Ronney Asters M.D.   On: 09/12/2021 17:41   CARDIAC CATHETERIZATION  Result Date: 09/12/2021   Origin lesion is 100% stenosed.   Origin lesion is 100% stenosed.   Mid RCA lesion is 100% stenosed.   Prox RCA lesion is 70% stenosed.   Prox LAD to Mid LAD lesion is 80% stenosed.   Mid LAD lesion is 100% stenosed.   Prox Cx lesion is 100% stenosed.   Ost LM to Mid LM lesion is 70% stenosed.   RPAV lesion is 100% stenosed. 1. Elevated left > right heart filling pressures. 2. Mixed pulmonary venous/pulmonary arterial hypertension.  The PAH component may be due to COPD with group 3 PH. 3. Low cardiac index. 4. Patent LIMA-LAD and free RIMA-ramus.  The RCA and SVG-RCA were known to the occluded.  The SVG-OM is newly-noted to be occluded (native LCX is occluded). No interventional target. With new atrial fibrillation, volume overload, and low CO, will admit patient.   [place] EKG SITE RITE  Result Date: 09/12/2021 If Site Rite image not attached, placement could not be confirmed due to current cardiac rhythm.    Medications:     Scheduled Medications:  apixaban  5 mg Oral BID   Chlorhexidine Gluconate Cloth  6 each Topical Daily   digoxin  0.125 mg Oral Daily   DULoxetine  60 mg Oral Daily   furosemide  60 mg Intravenous BID   mouth rinse  15 mL Mouth Rinse BID   nicotine  14 mg Transdermal Daily   prazosin  1 mg Oral QHS   QUEtiapine  150 mg Oral QHS   sodium chloride flush  10-40 mL Intracatheter Q12H   traZODone  100 mg Oral QHS   umeclidinium bromide  1 puff Inhalation Daily    Infusions:  amiodarone 30 mg/hr (09/13/21 0716)   milrinone 0.25 mcg/kg/min (09/13/21 0717)    PRN Medications: acetaminophen, ALPRAZolam, hydrALAZINE, ipratropium, labetalol, ondansetron (ZOFRAN) IV, sodium chloride flush    Patient Profile  81 y/o male w/ h/o CAD s/p CABG in  2004 w/ LIMA-LAD, free RIMA-RI, SVG-OM2, SVG-RPDA, Tobacco use, severe COPD, h/o NSCLC s/p lobectomy in 2012, OSA on CPAP, PVD s/p Rt Fem-Pop bypass, Stage IIIa CKD, HTN, HLD and LE DVT.   Admitted with volume overload.    Assessment/Plan    1. Acute on chronic systolic CHF: Echo in 4/09 with EF 20-25%, mildly dilated and severely dysfunctional RV, moderate MR.  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  Since that time, it appears that he has lost an SVG-OM and the LCx is known to be occluded.  He is also in atrial fibrillation which likely contributes to HF as  well.  RHC today showed elevated L>R sided filling pressures, PAPI adequate.  CI was low at 1.68.   - Post cath started on  milrinone 0.25 mcg/kg/min. CO-OX stable 72%. Once diuresed will start to wean milrinone.  - Continue  digoxin 0.125 - No room for GDMT with SBP < 100.   2. Atrial fibrillation: He has been in atrial fibrillation since at least 1/23.  Rate has not been markedly high.  Last ECG with NSR was in 8/22.  AF likely has worsened CHF.   - Started on amio drip with conversion to SR but went back in A fib this morning. Will check EKG now.  - Continue while on milrinone drip.   - Not good long-term option with severe COPD but will use at least in the short term.  - Continue Eliquis 5 mg bid.  - May need TEE/DC-CV if he doesn't convert. 3. CAD: Cath today showed occluded RCA and SVG-RCA (known from prior), patent LIMA-LAD, occluded LCx, and occluded SVG-OM (new finding). Free RIMA-ramus was patent.  New ostial occlusion of SVG-OM, no interventional option.  Plan medical management, no chest pain.   - No ASA given Eliquis use.  - He has not tolerated statins or Zetia, at discharge would refer to pharmacy clinic for [place].  4. PAD: S/p right fem-pop bypass.  5. H/o lung cancer: Lobectomy 2012.  6. COPD: On 4L home oxygen. He is an active smoker though wife says he has cut back.  -  nicotine patches in hospital. 7. OSA: Use CPAP.  8. 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  The PAH component may be due to COPD with group 3 PH.  9. PTSD: Continue home meds.  10. Thrombocytopenia: Mild, this appears chronic.   Check price for eliquis with pharmacy.  Consult PT.    Length of Stay: 1  Amy Clegg, NP  09/13/2021, 7:35 AM  Advanced Heart Failure Team Pager [phone] (M-F; 7a - 5p)  Please contact [facility] for night-coverage after hours (5p -7a ) and weekends on [email or web address]  Patient seen with NP, agree with the above note.   He is in NSR today with occasional PACs.  SBP in 90s-100s.  Co-ox 72%.  I get a CVP around 10 this morning.   No complaints, feels fine.   General: NAD Neck: JVP 8-9 cm, no thyromegaly or thyroid nodule.  Lungs: Clear to auscultation bilaterally with normal respiratory effort. CV: Nondisplaced PMI.  Heart regular S1/S2, no S3/S4, no murmur.  No peripheral edema.   Abdomen: Soft, nontender, no hepatosplenomegaly, no distention.  Skin: Intact without lesions or rashes.  Neurologic: Alert and oriented x 3.  Psych: Normal affect. Extremities: No clubbing or cyanosis.  HEENT: Normal.   I will continue milrinone + IV Lasix for today, then likely back to po diuretic and start milrinone wean tomorrow.  Continue digoxin, can add spironolactone 12.5 mg daily.  Hopefully add dapagliflozin tomorrow.   He converted to NSR overnight, continue amiodarone gtt today while on milrinone.  Continue apixaban.    Ambulate, cardiac rehab.   Loralie Champagne 09/13/2021 12:28 PM

## 2021-09-13 NOTE — Consult Note (Signed)
WOC Nurse Consult Note: ?Reason for Consult:Stage 2 pressure injury with surrounding blanching erythema ?Wound type:pressure ?Pressure Injury POA: Yes ?Measurement:1.4cm x 1cm x 0.1cm ?Wound YVD:PBAQ, moist ?Drainage (amount, consistency, odor) scant serous ?Periwound:blanching erythema ?Dressing procedure/placement/frequency: Patient is in the side lying position at the time of my assessment and reports that the area is too painful upon which to lie, so he has no trouble following my guidance. I ave provided Nursing with Orders for the care of this lesion including cleansing with NS, and covering with a xeroform (antimicrobial nonadherent) then topping with a silicone foam. A pressure redistribution chair cushion is also provided for OOB use. ? ?Patient is in agreement with the POC. ? ?[place] nursing team will not follow, but will remain available to this patient, the nursing and medical teams.  Please re-consult if needed. ?Thanks, ?Maudie Flakes, MSN, RN, [place], [place], CWON-AP, [place]  ?Pager# [phone]  ? ? ? ?  ?

## 2021-09-13 NOTE — Evaluation (Signed)
Physical Therapy Evaluation ?Patient Details ?Name: John Ball ?MRN: [id number] ?DOB: 11-13-1940 ?Today's Date: 09/13/2021 ? ?History of Present Illness ? 81 yo admitted 3/8 with volume overload, CHF, with new Afib s/p heart cath 3/8. PMHx:CAD s/p CABG, Tobacco use, severe COPD, h/o NSCLC s/p lobectomy, OSA on CPAP, PVD, CKD, HTN, HLD, LE DVT, and PTSD  ?Clinical Impression ? Pt pleasant with limited verbalization and generally fatigued. Pt has required assist at home and tolerating very limited mobility for the last month. Today pt only able to stand at EOB but not transfer and required 4L at all times with new probe placed and sats 100%. Pt with decreased strength, function, and mobility  who will benefit from acute therapy to maximize strength and safety to decrease burden of care. Pt unable to transition OOB today but did not want to discuss possibility of SNF and with assist of wife and DME reports he can manage at home. Would maximize [facility] services with return home.  ? ?HR 79-93 ?SPo2 100% on 4L  ?BP 95/78 (85) ?   ? ?Recommendations for follow up therapy are one component of a multi-disciplinary discharge planning process, led by the attending physician.  Recommendations may be updated based on patient status, additional functional criteria and insurance authorization. ? ?Follow Up Recommendations Home health PT ? ?  ?Assistance Recommended at Discharge Frequent or constant Supervision/Assistance  ?Patient can return home with the following ? A little help with bathing/dressing/bathroom;A little help with walking and/or transfers;Assistance with cooking/housework;Assist for transportation;Direct supervision/assist for medications management ? ?  ?Equipment Recommendations None recommended by PT  ?Recommendations for Other Services ?    ?  ?Functional Status Assessment Patient has had a recent decline in their functional status and demonstrates the ability to make significant improvements in function in a  reasonable and predictable amount of time.  ? ?  ?Precautions / Restrictions Precautions ?Precautions: Fall;Other (comment) ?Precaution Comments: watch sats. LUE post heart cath 3/8  ? ?  ? ?Mobility ? Bed Mobility ?Overal bed mobility: Needs Assistance ?Bed Mobility: Supine to Sit, Sit to Supine ?  ?  ?Supine to sit: Supervision, HOB elevated ?  ?  ?General bed mobility comments: HOB 35 degrees to rise from surface. return to flat bed without physical assist. Supervision for lines ?  ? ?Transfers ?Overall transfer level: Needs assistance ?  ?Transfers: Sit to/from Stand ?Sit to Stand: Min guard ?  ?  ?  ?  ?  ?General transfer comment: cues not to push with LUE with pt able to rise and take a side step toward HOB. Pt denied OOB to chair due to fatigue ?  ? ?Ambulation/Gait ?  ?  ?  ?  ?  ?  ?  ?  ? ?Stairs ?  ?  ?  ?  ?  ? ?Wheelchair Mobility ?  ? ?Modified Rankin (Stroke Patients Only) ?  ? ?  ? ?Balance Overall balance assessment: Needs assistance ?  ?Sitting balance-Leahy Scale: Fair ?Sitting balance - Comments: EOB without support ?  ?Standing balance support: Single extremity supported ?Standing balance-Leahy Scale: Poor ?Standing balance comment: single UE support for limited standing ?  ?  ?  ?  ?  ?  ?  ?  ?  ?  ?  ?   ? ? ? ?Pertinent Vitals/Pain Pain Assessment ?Pain Assessment: No/denies pain  ? ? ?Home Living Family/patient expects to be discharged to:: Private residence ?Living Arrangements: Spouse/significant other ?Available Help at Discharge: Family;Available  24 hours/day ?Type of Home: House ?Home Access: Level entry ?  ?  ?  ?Home Layout: One level ?Home Equipment: Engineer, maintenance (IT) (2 wheels);Rollator (4 wheels);Transport chair;Shower seat;Cane - single point;Adaptive equipment ?   ?  ?Prior Function Prior Level of Function : Needs assist ?  ?  ?  ?  ?  ?  ?Mobility Comments: limited walking in the house at baseline holding furniture, scooter outside. For the last month staying the  recliner day and night and only walking the bathroom for BM ?ADLs Comments: was independent for ADLS but the last month has required assist for all ADLs, Wife has always done the homemaking.  Hasn't been driving for 3 weeks ?  ? ? ?Hand Dominance  ?   ? ?  ?Extremity/Trunk Assessment  ? Upper Extremity Assessment ?Upper Extremity Assessment: Generalized weakness ?  ? ?Lower Extremity Assessment ?Lower Extremity Assessment: Generalized weakness ?  ? ?Cervical / Trunk Assessment ?Cervical / Trunk Assessment: Kyphotic  ?Communication  ? Communication: HOH  ?Cognition Arousal/Alertness: Awake/alert ?Behavior During Therapy: Flat affect ?Overall Cognitive Status: Difficult to assess ?  ?  ?  ?  ?  ?  ?  ?  ?  ?  ?  ?  ?  ?  ?  ?  ?General Comments: pt answering questions with delay, wife providing all PLOF and home setup ?  ?  ? ?  ?General Comments   ? ?  ?Exercises    ? ?Assessment/Plan  ?  ?PT Assessment Patient needs continued PT services  ?PT Problem List Decreased strength;Decreased mobility;Decreased activity tolerance;Decreased balance;Decreased knowledge of use of DME;Cardiopulmonary status limiting activity ? ?   ?  ?PT Treatment Interventions Gait training;Balance training;Functional mobility training;Therapeutic activities;Patient/family education;Therapeutic exercise;DME instruction   ? ?PT Goals (Current goals can be found in the Care Plan section)  ?Acute Rehab PT Goals ?Patient Stated Goal: return home ?PT Goal Formulation: With patient/family ?Time For Goal Achievement: 09/27/21 ?Potential to Achieve Goals: Fair ? ?  ?Frequency Min 3X/week ?  ? ? ?Co-evaluation   ?  ?  ?  ?  ? ? ?  ?AM-PAC PT "6 Clicks" Mobility  ?Outcome Measure Help needed turning from your back to your side while in a flat bed without using bedrails?: A Little ?Help needed moving from lying on your back to sitting on the side of a flat bed without using bedrails?: A Little ?Help needed moving to and from a bed to a chair (including a  wheelchair)?: A Little ?Help needed standing up from a chair using your arms (e.g., wheelchair or bedside chair)?: A Little ?Help needed to walk in hospital room?: Total ?Help needed climbing 3-5 steps with a railing? : Total ?6 Click Score: 14 ? ?  ?End of Session Equipment Utilized During Treatment: Oxygen ?Activity Tolerance: Patient limited by fatigue ?Patient left: in bed;with call bell/phone within reach ?Nurse Communication: Mobility status ?PT Visit Diagnosis: Other abnormalities of gait and mobility (R26.89);Difficulty in walking, not elsewhere classified (R26.2);Muscle weakness (generalized) (M62.81) ?  ? ?Time: 1829-9371 ?PT Time Calculation (min) (ACUTE ONLY): 43 min ? ? ?Charges:   PT Evaluation ?$PT Eval Moderate Complexity: 1 Mod ?PT Treatments ?$Therapeutic Activity: 8-22 mins ?  ?   ? ? ?Shikara Mcauliffe P, PT ?Acute Rehabilitation Services ?Pager: [phone] ?Office: [phone] ? ? ?Jathen Sudano B Doloros Kwolek ?09/13/2021, 1:34 PM ? ?

## 2021-09-14 ENCOUNTER — Inpatient Hospital Stay (HOSPITAL_COMMUNITY): Payer: Medicare Other

## 2021-09-14 ENCOUNTER — Encounter (HOSPITAL_COMMUNITY)
Admission: AD | Disposition: A | Discharge status: Home Health | Payer: Self-pay | Source: Home / Self Care | Attending: Cardiology

## 2021-09-14 ENCOUNTER — Other Ambulatory Visit (HOSPITAL_COMMUNITY): Payer: Self-pay

## 2021-09-14 DIAGNOSIS — J441 Chronic obstructive pulmonary disease with (acute) exacerbation: Secondary | ICD-10-CM

## 2021-09-14 LAB — BASIC METABOLIC PANEL
Anion gap: 10 (ref 5–15)
BUN: 21 mg/dL (ref 8–23)
CO2: 32 mmol/L (ref 22–32)
Calcium: 8.7 mg/dL — ABNORMAL LOW (ref 8.9–10.3)
Chloride: 92 mmol/L — ABNORMAL LOW (ref 98–111)
Creatinine, Ser: 1.26 mg/dL — ABNORMAL HIGH (ref 0.61–1.24)
GFR, Estimated: 58 mL/min — ABNORMAL LOW (ref >60)
Glucose, Bld: 234 mg/dL — ABNORMAL HIGH (ref 70–99)
Potassium: 3.7 mmol/L (ref 3.5–5.1)
Sodium: 134 mmol/L — ABNORMAL LOW (ref 135–145)

## 2021-09-14 LAB — CBC
HCT: 33.6 % — ABNORMAL LOW (ref 39.0–52.0)
Hemoglobin: 11 g/dL — ABNORMAL LOW (ref 13.0–17.0)
MCH: 35.7 pg — ABNORMAL HIGH (ref 26.0–34.0)
MCHC: 32.7 g/dL (ref 30.0–36.0)
MCV: 109.1 fL — ABNORMAL HIGH (ref 80.0–100.0)
Platelets: 79 10*3/uL — ABNORMAL LOW (ref 150–400)
RBC: 3.08 MIL/uL — ABNORMAL LOW (ref 4.22–5.81)
RDW: 15 % (ref 11.5–15.5)
WBC: 3.6 10*3/uL — ABNORMAL LOW (ref 4.0–10.5)
nRBC: 0 % (ref 0.0–0.2)

## 2021-09-14 LAB — COOXEMETRY PANEL
Carboxyhemoglobin: 1.3 % (ref 0.5–1.5)
Methemoglobin: 3.7 % — ABNORMAL HIGH (ref 0.0–1.5)
O2 Saturation: 82.5 %
Total hemoglobin: 12 g/dL (ref 12.0–16.0)

## 2021-09-14 LAB — MAGNESIUM: Magnesium: 1.9 mg/dL (ref 1.7–2.4)

## 2021-09-14 SURGERY — ECHOCARDIOGRAM, TRANSESOPHAGEAL
Anesthesia: Monitor Anesthesia Care

## 2021-09-14 MED ORDER — SPIRONOLACTONE 25 MG PO TABS
25.0000 mg | ORAL_TABLET | Freq: Every day | ORAL | Status: DC
  Administered 2021-09-14 – 2021-09-17 (×4): 25 mg via ORAL
  Filled 2021-09-14 (×4): qty 1

## 2021-09-14 MED ORDER — EMPAGLIFLOZIN 10 MG PO TABS
10.0000 mg | ORAL_TABLET | Freq: Every day | ORAL | Status: DC
  Administered 2021-09-15 – 2021-09-17 (×3): 10 mg via ORAL
  Filled 2021-09-14 (×3): qty 1

## 2021-09-14 MED ORDER — IPRATROPIUM BROMIDE 0.02 % IN SOLN: 0.5000 mg | Freq: Four times a day (QID) | RESPIRATORY_TRACT | Status: DC

## 2021-09-14 MED ORDER — MAGNESIUM SULFATE 2 GM/50ML IV SOLN
2.0000 g | Freq: Once | INTRAVENOUS | Status: AC
  Administered 2021-09-14: 2 g via INTRAVENOUS
  Filled 2021-09-14: qty 50

## 2021-09-14 MED ORDER — METHYLPREDNISOLONE SODIUM SUCC 40 MG IJ SOLR: 40.0000 mg | INTRAMUSCULAR | Status: DC

## 2021-09-14 MED ORDER — ALBUTEROL SULFATE (2.5 MG/3ML) 0.083% IN NEBU
INHALATION_SOLUTION | RESPIRATORY_TRACT | Status: AC
  Administered 2021-09-14: 2.5 mg
  Filled 2021-09-14: qty 3

## 2021-09-14 MED ORDER — REVEFENACIN 175 MCG/3ML IN SOLN
175.0000 ug | Freq: Every day | RESPIRATORY_TRACT | Status: DC
  Administered 2021-09-15 – 2021-09-17 (×3): 175 ug via RESPIRATORY_TRACT
  Filled 2021-09-14 (×4): qty 3

## 2021-09-14 MED ORDER — METHYLPREDNISOLONE SODIUM SUCC 125 MG IJ SOLR: 120.0000 mg | INTRAMUSCULAR | Status: DC

## 2021-09-14 MED ORDER — TORSEMIDE 20 MG PO TABS
20.0000 mg | ORAL_TABLET | Freq: Every day | ORAL | Status: DC
  Administered 2021-09-14 – 2021-09-17 (×4): 20 mg via ORAL
  Filled 2021-09-14 (×4): qty 1

## 2021-09-14 MED ORDER — ARFORMOTEROL TARTRATE 15 MCG/2ML IN NEBU
15.0000 ug | INHALATION_SOLUTION | Freq: Two times a day (BID) | RESPIRATORY_TRACT | Status: DC
  Administered 2021-09-14 – 2021-09-17 (×7): 15 ug via RESPIRATORY_TRACT
  Filled 2021-09-14 (×7): qty 2

## 2021-09-14 MED ORDER — METHYLPREDNISOLONE SODIUM SUCC 40 MG IJ SOLR
40.0000 mg | Freq: Every day | INTRAMUSCULAR | Status: DC
  Administered 2021-09-14 – 2021-09-15 (×2): 40 mg via INTRAVENOUS
  Filled 2021-09-14 (×2): qty 1

## 2021-09-14 MED ORDER — AMIODARONE HCL 200 MG PO TABS
200.0000 mg | ORAL_TABLET | Freq: Every day | ORAL | Status: DC
Start: 2021-09-14 — End: 2021-09-14

## 2021-09-14 MED ORDER — POTASSIUM CHLORIDE CRYS ER 20 MEQ PO TBCR
40.0000 meq | EXTENDED_RELEASE_TABLET | Freq: Once | ORAL | Status: AC
Start: 2021-09-14 — End: 2021-09-14
  Administered 2021-09-14: 40 meq via ORAL
  Filled 2021-09-14: qty 2

## 2021-09-14 MED ORDER — BUDESONIDE 0.5 MG/2ML IN SUSP
0.5000 mg | Freq: Two times a day (BID) | RESPIRATORY_TRACT | Status: DC
  Administered 2021-09-14 – 2021-09-17 (×7): 0.5 mg via RESPIRATORY_TRACT
  Filled 2021-09-14 (×7): qty 2

## 2021-09-14 MED ORDER — AMIODARONE HCL 200 MG PO TABS
200.0000 mg | ORAL_TABLET | Freq: Two times a day (BID) | ORAL | Status: DC
  Administered 2021-09-14 – 2021-09-17 (×7): 200 mg via ORAL
  Filled 2021-09-14 (×7): qty 1

## 2021-09-14 NOTE — Progress Notes (Signed)
Called to patient's room by family stating that patient was having a seizure. Upon arrival, no tremors noted and patient was alert and oriented x4. Wife states that his head was "shaking like a seizure." While in the room the patient's head begin shaking again while he was falling asleep. Did not appear as seizure-like activity and maintains neuro status. NP and rapid response made aware. ?

## 2021-09-14 NOTE — Care Management Important Message (Signed)
Important Message ? ?Patient Details  ?Name: John Ball ?MRN: [id number] ?Date of Birth: October 08, 1940 ? ? ?Medicare Important Message Given:  Yes ? ? ? ? ?Shelda Altes ?09/14/2021, 9:25 AM ?

## 2021-09-14 NOTE — Progress Notes (Addendum)
Advanced Heart Failure Rounding Note  PCP-Cardiologist: Donato Heinz, MD   Subjective:   3/8- Started milrinone 0.25 mcg. PICC placed. CO-OX 72%. Diuresing with IV lasix. Placed on  amio drip for A fib.   RHC/LHC  1. Elevated left > right heart filling pressures. PCWP 26>RA 9  2. Mixed pulmonary venous/pulmonary arterial hypertension.  The PAH component may be due to COPD with group 3 PH.  3. Low cardiac index.  4. Patent LIMA-LAD and free RIMA-ramus.  The RCA and SVG-RCA were known to the occluded.  The SVG-OM is newly-noted to be occluded (native LCX is occluded). No interventional target.    Yesterday diuresed with IV lasix. Negative 2 liters. CVP 6   Increased WOB this morning. Placed on Bipap.    Objective:   Weight Range: 73.1 kg Body mass index is 21.86 kg/m.   Vital Signs:   Temp:  [97.5 F (36.4 C)-98.4 F (36.9 C)] 97.5 F (36.4 C) (03/10 0729) Pulse Rate:  [56-126] 83 (03/10 0729) Resp:  [13-37] 16 (03/10 0729) BP: (97-138)/(53-92) 113/71 (03/10 0729) SpO2:  [86 %-100 %] 92 % (03/10 0840) FiO2 (%):  [50 %-100 %] 50 % (03/10 0840) Weight:  [73.1 kg] 73.1 kg (03/10 0414) Last BM Date : 09/12/21  Weight change: Filed Weights   09/12/21 1510 09/13/21 0411 09/14/21 0414  Weight: 77.4 kg 75.7 kg 73.1 kg    Intake/Output:   Intake/Output Summary (Last 24 hours) at 09/14/2021 0849 Last data filed at 09/14/2021 5697 Gross per 24 hour  Intake 622.85 ml  Output 2675 ml  Net -2052.15 ml      Physical Exam   CVP 6-7  General:  On Bipap  HEENT: normal Neck: supple. no JVD. Carotids 2+ bilat; no bruits. No lymphadenopathy or thryomegaly appreciated. Cor: PMI nondisplaced. Regular rate & rhythm. No rubs, gallops or murmurs. Lungs: crackles in the bases on Bipap  Abdomen: soft, nontender, nondistended. No hepatosplenomegaly. No bruits or masses. Good bowel sounds. Extremities: no cyanosis, clubbing, rash, edema. RUE PICC  Neuro: alert &  orientedx3, cranial nerves grossly intact. moves all 4 extremities w/o difficulty. Affect pleasant  Telemetry  SR 70-80s with PACS personally checked.   EKG  N/A  Labs    CBC Recent Labs    09/13/21 0500 09/14/21 0522  WBC 3.8* 3.6*  HGB 12.1* 11.0*  HCT 36.8* 33.6*  MCV 109.5* 109.1*  PLT 84* 79*   Basic Metabolic Panel Recent Labs    09/13/21 0500 09/14/21 0522  NA 139 134*  K 3.8 3.7  CL 101 92*  CO2 31 32  GLUCOSE 103* 234*  BUN 22 21  CREATININE 1.25* 1.26*  CALCIUM 9.0 8.7*  MG 1.9 1.9   Liver Function Tests Recent Labs    09/12/21 1624  AST 18  ALT 14  ALKPHOS 45  BILITOT 0.5  PROT 6.1*  ALBUMIN 3.7   No results for input(s): LIPASE, AMYLASE in the last 72 hours. Cardiac Enzymes No results for input(s): CKTOTAL, CKMB, CKMBINDEX, TROPONINI in the last 72 hours.  BNP: BNP (last 3 results) Recent Labs    07/13/21 1210 09/12/21 1624  BNP 1,058.3* 2,341.0*    ProBNP (last 3 results) No results for input(s): PROBNP in the last 8760 hours.   D-Dimer No results for input(s): DDIMER in the last 72 hours. Hemoglobin A1C No results for input(s): HGBA1C in the last 72 hours. Fasting Lipid Panel No results for input(s): CHOL, HDL, LDLCALC, TRIG, CHOLHDL, LDLDIRECT  in the last 72 hours. Thyroid Function Tests No results for input(s): TSH, T4TOTAL, T3FREE, THYROIDAB in the last 72 hours.  Invalid input(s): FREET3  Other results:   Imaging    DG CHEST PORT 1 VIEW  Result Date: 09/13/2021 CLINICAL DATA:  Acute respiratory distress. EXAM: PORTABLE CHEST 1 VIEW COMPARISON:  Chest x-ray 09/12/2021.  CT of the chest 07/30/2021. FINDINGS: There is stable blunting of the right costophrenic angle. There is a new small left pleural effusion. There are diffuse bilateral interstitial opacities and some patchy airspace opacities in the left lower lung. Surgical staple line again seen in the right lower lung. Right-sided central venous catheter tip projects  over the mid SVC. Patient is status post cardiac surgery. The heart is enlarged, unchanged. There is no pneumothorax or acute fracture. IMPRESSION: 1. Cardiomegaly with mild pulmonary edema pattern. 2. Small bilateral pleural effusions, new on the left. 3. Can not exclude infection in the lung bases. Electronically Signed   By: Ronney Asters M.D.   On: 09/13/2021 20:32     Medications:     Scheduled Medications:  apixaban  5 mg Oral BID   Chlorhexidine Gluconate Cloth  6 each Topical Daily   dapagliflozin propanediol  10 mg Oral Daily   digoxin  0.125 mg Oral Daily   DULoxetine  60 mg Oral Daily   furosemide  60 mg Intravenous BID   mouth rinse  15 mL Mouth Rinse BID   nicotine  14 mg Transdermal Daily   prazosin  1 mg Oral QHS   QUEtiapine  150 mg Oral QHS   sodium chloride flush  10-40 mL Intracatheter Q12H   spironolactone  12.5 mg Oral Daily   traZODone  100 mg Oral QHS   umeclidinium bromide  1 puff Inhalation Daily    Infusions:  amiodarone 30 mg/hr (09/14/21 0725)   milrinone 0.25 mcg/kg/min (09/14/21 0507)    PRN Medications: acetaminophen, ALPRAZolam, ipratropium, ondansetron (ZOFRAN) IV, sodium chloride flush    Patient Profile  81 y/o male w/ h/o CAD s/p CABG in 2004 w/ LIMA-LAD, free RIMA-RI, SVG-OM2, SVG-RPDA, Tobacco use, severe COPD, h/o NSCLC s/p lobectomy in 2012, OSA on CPAP, PVD s/p Rt Fem-Pop bypass, Stage IIIa CKD, HTN, HLD and LE DVT.   Admitted with volume overload.    Assessment/Plan    1. Acute on chronic systolic CHF: Echo in 7/84 with EF 20-25%, mildly dilated and severely dysfunctional RV, moderate MR.  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  Since that time, it appears that he has lost an SVG-OM and the LCx is known to be occluded.  He is also in atrial fibrillation which likely contributes to HF as well.  RHC today showed elevated L>R sided filling pressures, PAPI adequate.  CI was low at 1.68.   -  Post cath started on  milrinone 0.25 mcg/kg/min. CO-OX stable 82%.  Stop milrinone  - CVP 6-7. Switch to po diuretics. Start torsemide 20 mg daily. Renal function stable.   - Continue farxiga 10 mg daily  - Continue  digoxin 0.125 - Increase spironolactone to 25 mg daily.  2. Atrial fibrillation: He has been in atrial fibrillation since at least 1/23.  Rate has not been markedly high.  Last ECG with NSR was in 8/22.  AF likely has worsened CHF.   - Started on amio drip with conversion to SR - Stop amio drip . Place on amio 200 mg daily  - Not good  long-term option with severe COPD but will use at least in the short term.  - Continue Eliquis 5 mg bid.  - May need TEE/DC-CV if he doesn't convert. 3. CAD: Cath today showed occluded RCA and SVG-RCA (known from prior), patent LIMA-LAD, occluded LCx, and occluded SVG-OM (new finding). Free RIMA-ramus was patent.  New ostial occlusion of SVG-OM, no interventional option.  Plan medical management, no chest pain.   - No ASA given Eliquis use.  - He has not tolerated statins or Zetia, at discharge would refer to pharmacy clinic for [place].  4. PAD: S/p right fem-pop bypass.  5. H/o lung cancer: Lobectomy 2012.  6. COPD: On 4L home oxygen. He is an active smoker though wife says he has cut back.  - nicotine patches in hospital. 7. OSA: Use CPAP.  - On Bipap now.  8. 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  The PAH component may be due to COPD with group 3 PH.  9. PTSD: Continue home meds.  10. Thrombocytopenia: Mild, this appears chronic.   Check CXR now. Ask pulmonary to evaluate for COPD.  Dr Aundra Dubin at the bedside- Start solumedrol 60mg  IV twice a day. Pulmonary consulted.   Length of Stay: 2  Darrick Grinder, NP  09/14/2021, 8:49 AM  Advanced Heart Failure Team Pager [phone] (M-F; 7a - 5p)  Please contact [facility] for night-coverage after hours (5p -7a ) and weekends on [email or web address]  Patient seen  with NP, agree with the above note.   I/Os -2062 net yesterday and CVP down to 6-7.  Co-ox 83%.  He remains in NSR.  SBP 90s-100s.    Despite good diuresis and management of volume, he is short of breath with wheezing and required Bipap overnight.   General: NAD Neck: No JVD, no thyromegaly or thyroid nodule.  Lungs: Decreased BS bilaterally with wheezes.  CV: Nondisplaced PMI.  Heart regular S1/S2, no S3/S4, no murmur.  No peripheral edema.   Abdomen: Soft, nontender, no hepatosplenomegaly, no distention.  Skin: Intact without lesions or rashes.  Neurologic: Alert and oriented x 3.  Psych: Normal affect. Extremities: No clubbing or cyanosis.  HEENT: Normal.   Volume much improved.  Will stop milrinone and Lasix today.  Start torsemide 20 mg daily.  Increase spironolactone to 25 mg daily, continue [place] and digoxin.  BP too soft for Entresto at this time.   I think COPD is his major issue now.  Wheezing and hypoxemic overnight, required Bipap.  He is on 4L [place] at home.   - Start Solumedrol 60 mg IV bid.  - Atrovent nebs, try to avoid albuterol with recent conversion from AF to NSR.  - Will ask pulmonary to see him for further evaluation.   He remains in NSR today, stop IV amiodarone and convert to po.  Continue apixaban.   Loralie Champagne 09/14/2021 9:59 AM

## 2021-09-14 NOTE — Evaluation (Signed)
Occupational Therapy Evaluation ?Patient Details ?Name: John Ball ?MRN: [id number] ?DOB: 1940/08/08 ?Today's Date: 09/14/2021 ? ? ?History of Present Illness 81 yo admitted 3/8 with volume overload, CHF, with new Afib s/p heart cath 3/8. PMHx:CAD s/p CABG, Tobacco use, severe COPD, h/o NSCLC s/p lobectomy, OSA on CPAP, PVD, CKD, HTN, HLD, LE DVT, and PTSD  ? ?Clinical Impression ?  ?Prior to this admission, patient was living with his wife and on 4L of oxygen. Patient typically sleeping in a recliner, but able to complete basic ADLs independently and with extra time. Currently patient is on 8L HFNC oxygen with increased work of breathing for basic transfers. Patient is set up for ADLs, and min guard to march in place in front of the recliner. RR noted to get up to 38 while seated, but remaining in the 20s when marching in place. Session limited due to increased fatigue and and WOB. Noted inaccurate pleth line for the majority of the session, despite switching out O2 sensor (RN notified). OT recommending HHOT at discharge, therapy will continue to follow acutely in order to address problem list outlined below.  ?   ? ?Recommendations for follow up therapy are one component of a multi-disciplinary discharge planning process, led by the attending physician.  Recommendations may be updated based on patient status, additional functional criteria and insurance authorization.  ? ?Follow Up Recommendations ? Home health OT  ?  ?Assistance Recommended at Discharge Intermittent Supervision/Assistance  ?Patient can return home with the following A little help with walking and/or transfers;A little help with bathing/dressing/bathroom;Assistance with cooking/housework;Assist for transportation;Help with stairs or ramp for entrance ? ?  ?Functional Status Assessment ? Patient has had a recent decline in their functional status and demonstrates the ability to make significant improvements in function in a reasonable and  predictable amount of time.  ?Equipment Recommendations ? BSC/3in1  ?  ?Recommendations for Other Services   ? ? ?  ?Precautions / Restrictions Precautions ?Precautions: Fall;Other (comment) ?Precaution Comments: watch sats. LUE post heart cath 3/8  ? ?  ? ?Mobility Bed Mobility ?  ?  ?  ?  ?  ?  ?  ?General bed mobility comments: Up in chair upon arrival ?  ? ?Transfers ?Overall transfer level: Needs assistance ?Equipment used: 1 person hand held assist ?Transfers: Sit to/from Stand ?Sit to Stand: Min guard ?  ?  ?  ?  ?  ?General transfer comment: able to come into standing with minimal pushing through LUE, able to march in place prior to fatiguing, with increased WOB ?  ? ?  ?Balance Overall balance assessment: Needs assistance ?  ?Sitting balance-Leahy Scale: Fair ?  ?  ?Standing balance support: Single extremity supported ?Standing balance-Leahy Scale: Poor ?Standing balance comment: single UE support for limited standing ?  ?  ?  ?  ?  ?  ?  ?  ?  ?  ?  ?   ? ?ADL either performed or assessed with clinical judgement  ? ?ADL Overall ADL's : Needs assistance/impaired ?Eating/Feeding: Set up;Sitting ?  ?Grooming: Set up;Sitting ?  ?Upper Body Bathing: Set up;Sitting ?  ?Lower Body Bathing: Set up;Sitting/lateral leans ?  ?Upper Body Dressing : Set up;Sitting ?  ?Lower Body Dressing: Set up;Sitting/lateral leans ?  ?Toilet Transfer: Minimal assistance;Ambulation ?  ?Toileting- Clothing Manipulation and Hygiene: Minimal assistance;Sit to/from stand ?  ?  ?  ?Functional mobility during ADLs: Minimal assistance;Rolling walker (2 wheels) ?General ADL Comments: Patient presenting with decreased activity  tolerance and increased WOB completing basic transfers  ? ? ? ?Vision Baseline Vision/History: 1 Wears glasses ?Ability to See in Adequate Light: 0 Adequate ?Patient Visual Report: No change from baseline ?   ?   ?Perception   ?  ?Praxis   ?  ? ?Pertinent Vitals/Pain Pain Assessment ?Pain Assessment: No/denies pain   ? ? ? ?Hand Dominance Right ?  ?Extremity/Trunk Assessment Upper Extremity Assessment ?Upper Extremity Assessment: Generalized weakness;Overall [facility] for tasks assessed ?  ?Lower Extremity Assessment ?Lower Extremity Assessment: Defer to PT evaluation ?  ?Cervical / Trunk Assessment ?Cervical / Trunk Assessment: Kyphotic ?  ?Communication Communication ?Communication: HOH ?  ?Cognition Arousal/Alertness: Awake/alert ?Behavior During Therapy: Flat affect ?Overall Cognitive Status: Within Functional Limits for tasks assessed ?  ?  ?  ?  ?  ?  ?  ?  ?  ?  ?  ?  ?  ?  ?  ?  ?General Comments:  (patient able to respond appropriately to date, A&O x4) ?  ?  ?General Comments  Inaccurate pleth line ? ?  ?Exercises   ?  ?Shoulder Instructions    ? ? ?Home Living Family/patient expects to be discharged to:: Private residence ?Living Arrangements: Spouse/significant other ?Available Help at Discharge: Family;Available 24 hours/day ?Type of Home: House ?Home Access: Level entry ?  ?  ?Home Layout: One level ?  ?  ?Bathroom Shower/Tub: Walk-in shower ?  ?Bathroom Toilet: Handicapped height ?  ?  ?Home Equipment: Engineer, maintenance (IT) (2 wheels);Rollator (4 wheels);Transport chair;Shower seat;Cane - single point;Adaptive equipment ?Adaptive Equipment: Reacher;Long-handled sponge;Sock aid ?  ?  ? ?  ?Prior Functioning/Environment Prior Level of Function : Needs assist ?  ?  ?  ?  ?  ?  ?Mobility Comments: limited walking in the house at baseline holding furniture, scooter outside. For the last month staying the recliner day and night and only walking the bathroom for BM ?ADLs Comments: was independent for ADLS but the last month has required assist for all ADLs, Wife has always done the homemaking.  Hasn't been driving for 3 weeks ?  ? ?  ?  ?OT Problem List: Decreased strength;Decreased activity tolerance;Impaired balance (sitting and/or standing);Cardiopulmonary status limiting activity;Decreased coordination ?  ?   ?OT  Treatment/Interventions: Self-care/ADL training;Therapeutic exercise;Energy conservation;DME and/or AE instruction;Therapeutic activities;Balance training;Patient/family education  ?  ?OT Goals(Current goals can be found in the care plan section) Acute Rehab OT Goals ?Patient Stated Goal: to smoke a cigarette ?OT Goal Formulation: With patient ?Time For Goal Achievement: 09/28/21 ?Potential to Achieve Goals: Fair  ?OT Frequency: Min 2X/week ?  ? ?Co-evaluation   ?  ?  ?  ?  ? ?  ?AM-PAC OT "6 Clicks" Daily Activity     ?Outcome Measure Help from another person eating meals?: A Little ?Help from another person taking care of personal grooming?: A Little ?Help from another person toileting, which includes using toliet, bedpan, or urinal?: A Little ?Help from another person bathing (including washing, rinsing, drying)?: A Little ?Help from another person to put on and taking off regular upper body clothing?: A Little ?Help from another person to put on and taking off regular lower body clothing?: A Little ?6 Click Score: 18 ?  ?End of Session Equipment Utilized During Treatment: Oxygen ?Nurse Communication: Other (comment) (Poor) ? ?Activity Tolerance: Patient limited by fatigue;Patient limited by lethargy ?Patient left: in chair;with call bell/phone within reach;with chair alarm set ? ?OT Visit Diagnosis: Unsteadiness on feet (R26.81);Other abnormalities of  gait and mobility (R26.89);Muscle weakness (generalized) (M62.81)  ?              ?Time: 9390-3009 ?OT Time Calculation (min): 15 min ?Charges:  OT General Charges ?$OT Visit: 1 Visit ?OT Evaluation ?$OT Eval Moderate Complexity: 1 Mod ? ?Corinne Ports E. Lousie Calico, OTR/L ?Acute Rehabilitation Services ?[phone] ?[phone]  ? ?Corinne Ports Gala Padovano ?09/14/2021, 11:46 AM ?

## 2021-09-14 NOTE — Progress Notes (Signed)
PRN ALB was given to Pt due to exp.wheezes and increased WOB. Placed Pt on BiPAP due to increased altered mental status. RT will continue to monitor. ?

## 2021-09-14 NOTE — Discharge Instructions (Signed)
Information on my medicine - ELIQUIS? (apixaban) ? ?This medication education was reviewed with me or my healthcare representative as part of my discharge preparation.  ? ?Why was Eliquis? prescribed for you? ?Eliquis? was prescribed for you to reduce the risk of forming blood clots that can cause a stroke if you have a medical condition called atrial fibrillation (a type of irregular heartbeat) OR to reduce the risk of a blood clots forming after orthopedic surgery. ? ?What do You need to know about Eliquis? ? ?Take your Eliquis? TWICE DAILY - one tablet in the morning and one tablet in the evening with or without food.  It would be best to take the doses about the same time each day. ? ?If you have difficulty swallowing the tablet whole please discuss with your pharmacist how to take the medication safely. ? ?Take Eliquis? exactly as prescribed by your doctor and DO NOT stop taking Eliquis? without talking to the doctor who prescribed the medication.  Stopping may increase your risk of developing a new clot or stroke.  Refill your prescription before you run out. ? ?After discharge, you should have regular check-up appointments with your healthcare provider that is prescribing your Eliquis?.  In the future your dose may need to be changed if your kidney function or weight changes by a significant amount or as you get older. ? ?What do you do if you miss a dose? ?If you miss a dose, take it as soon as you remember on the same day and resume taking twice daily.  Do not take more than one dose of ELIQUIS at the same time. ? ?Important Safety Information ?A possible side effect of Eliquis? is bleeding. You should call your healthcare provider right away if you experience any of the following: ?Bleeding from an injury or your nose that does not stop. ?Unusual colored urine (red or dark brown) or unusual colored stools (red or black). ?Unusual bruising for unknown reasons. ?A serious fall or if you hit your head (even  if there is no bleeding). ? ?Some medicines may interact with Eliquis? and might increase your risk of bleeding or clotting while on Eliquis?Marland Kitchen To help avoid this, consult your healthcare provider or pharmacist prior to using any new prescription or non-prescription medications, including herbals, vitamins, non-steroidal anti-inflammatory drugs (NSAIDs) and supplements. ? ?This website has more information on Eliquis? (apixaban): http://www.eliquis.com/eliquis/home ?  ?

## 2021-09-14 NOTE — Consult Note (Addendum)
NAME:  John Ball, MRN:  [id number], DOB:  1941/05/21, LOS: 2 ADMISSION DATE:  09/12/2021, CONSULTATION DATE: 3/10 REFERRING MD: Dr. Aundra Dubin, CHIEF COMPLAINT: Shortness of breath  History of Present Illness:  81 year old male who presented to [facility] on 3/8 for planned left and right heart cath.  Baseline pulmonary history-John Ball has known COPD with FEV1 of 36%, ratio 54% in 2017 on Wixela and Spiriva.  He is 4 L oxygen dependent.  He has known history of right lower lobe lobectomy for non-small cell lung cancer.  He has sleep apnea and uses CPAP.  The patient continues to smoke approximately quarter pack per day.  In January 2023 CT imaging revealed a new left lower lobe nodule that is being followed by Dr. Halford Chessman.  On arrival the patient reportedly had traveled for 1 hour without an oxygen tank.  He was hypoxic, diaphoretic on presentation was placed on 4 L per nasal cannula (patient's baseline) with improvement in symptoms.  He was admitted per the heart failure service with new onset atrial fibrillation.  R/LHC with elevated filling pressures and low output.  Mixed pulmonary venous/pulmonary arterial hypertension.  RCA and SVG-RCA with known occlusion and new occlusion of SVG-OM.  There were no targets for intervention.  The patient was started on milrinone, amiodarone infusion and Lasix.  The patient was noted to have increasing O2 requirements 3/10   PCCM consulted for evaluation.  Pertinent  Medical History  CAD - s/p CABG HTN PVD - s/p R Fem-pop bypass OSA - on CPAP COPD - 2017 spirometry with FEV1 of 1.21 / 36%, ratio 54% CKD 3A DVT  Significant Hospital Events: Including procedures, antibiotic start and stop dates in addition to other pertinent events   3/8 Admit after [facility].  New findings of SVG-OM occlusion, atrial fibrillation. 3/9 NSR.  On milrinone and Lasix.  Spironolactone added.  Seen by rapid response overnight for respiratory distress and increased O2 needs. 3/10  PCCM consulted for evaluation. Rx with 120 mg solumedrol  Interim History / Subjective:  Pt asking to sit on side of bed  Afebrile  Denies increased sputum production, change in color of sputum, fevers/chills Given 120 mg IV solumedrol   Objective   Blood pressure 103/60, pulse 65, temperature (!) 97.5 F (36.4 C), temperature source Oral, resp. rate 18, height 6' (1.829 m), weight 73.1 kg, SpO2 100 %. CVP:  [5 mmHg-10 mmHg] 6 mmHg  FiO2 (%):  [50 %-100 %] 50 %   Intake/Output Summary (Last 24 hours) at 09/14/2021 0941 Last data filed at 09/14/2021 0857 Gross per 24 hour  Intake 740.85 ml  Output 2675 ml  Net -1934.15 ml   Filed Weights   09/12/21 1510 09/13/21 0411 09/14/21 0414  Weight: 77.4 kg 75.7 kg 73.1 kg    Examination: General: cachectic adult male lying in bed in NAD  HENT: BiPAP mask in place on entry to room but straps not tight > mask lifting off face with significant air leak, changed back to sal[place] O2, sat probe moved from ear (sats 86%) to finger with sats 100%, anicteric, edentulous. No jvd.  Lungs: prolonged exp phase, lungs bilaterally with coarse rhonchi, wheezing, crackles, moist cough  Cardiovascular: S1S2 regular, SR, distant due to lung sounds Abdomen: soft, non-tender  Extremities: warm/dry, no edema  Neuro: AAOx4, pleasant, MAE  Resolved Hospital Problem list     Assessment & Plan:   Acute on Chronic Hypoxic Respiratory Failure  Exacerbated by not wearing O2, baseline 4L  dependent -wean O2 for sats 88-94%  -assess ambulatory O2 needs prior to discharge -need to ensure he has adequate tanks at home for travel to/from MD visits  Moderate-Severe COPD with Acute Exacerbation  Centrilobular Emphysema  Tobacco Abuse Baseline FEV1 1.21 (36%) in 2017 with ratio 54% > suspect he has progressed to severe since that time with ongoing tobacco abuse.  Exacerbated by hypoxia, drove to hospital for [facility] without oxygen.   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 Halford Chessman   Acute on Chronic Systolic CHF Atrial Fibrillation  CAD  PAD  -per primary team    Best Practice (right click and "Reselect all SmartList Selections" daily)  Per Primary   Labs   CBC: Recent Labs  Lab 09/12/21 0917 09/12/21 1624 09/13/21 0500 09/14/21 0522  WBC  --  4.0 3.8* 3.6*  HGB 15.0   15.3 14.3 12.1* 11.0*  HCT 44.0   45.0 43.3 36.8* 33.6*  MCV  --  109.3* 109.5* 109.1*  PLT  --  117* 84* 79*    Basic Metabolic Panel: Recent Labs  Lab 09/12/21 0917 09/12/21 1624 09/13/21 0500 09/14/21 0522  NA 139   139 137 139 134*  K 3.7   3.8 3.9 3.8 3.7  CL  --  98 101 92*  CO2  --  31 31 32  GLUCOSE  --  124* 103* 234*  BUN  --  24* 22 21  CREATININE  --  1.20 1.25* 1.26*  CALCIUM  --  9.0 9.0 8.7*  MG  --   --  1.9 1.9   GFR: Estimated Creatinine Clearance: 50 mL/min (A) (by C-G formula based on SCr of 1.26 mg/dL (H)). Recent Labs  Lab 09/12/21 1624 09/13/21 0500 09/14/21 0522  WBC 4.0 3.8* 3.6*    Liver Function Tests: Recent Labs  Lab 09/12/21 1624  AST 18  ALT 14  ALKPHOS 45  BILITOT 0.5  PROT 6.1*  ALBUMIN 3.7   No results for input(s): LIPASE, AMYLASE in the last 168 hours. No results for input(s): AMMONIA in the last 168 hours.  ABG    Component Value Date/Time   PHART 7.344 (L) 07/21/2016 1849   PCO2ART 55.4 (H) 07/21/2016 1849   PO2ART 183.0 (H) 07/21/2016 1849    HCO3 29.7 (H) 09/12/2021 0917   HCO3 29.9 (H) 09/12/2021 0917   TCO2 31 09/12/2021 0917   TCO2 32 09/12/2021 0917   O2SAT 82.5 09/14/2021 0522     Coagulation Profile: No results for input(s): INR, PROTIME in the last 168 hours.  Cardiac Enzymes: No results for input(s): CKTOTAL, CKMB, CKMBINDEX, TROPONINI in the last 168 hours.  HbA1C: Hgb A1c MFr Bld  Date/Time Value Ref Range Status  08/20/2021 12:00 AM 6.0 (H) 4.8 - 5.6 % Final    Comment:             Prediabetes: 5.7 - 6.4  Diabetes: >6.4          Glycemic control for adults with diabetes: <7.0   03/24/2019 08:31 AM 5.7 (H) 4.8 - 5.6 % Final    Comment:             Prediabetes: 5.7 - 6.4          Diabetes: >6.4          Glycemic control for adults with diabetes: <7.0     CBG: No results for input(s): GLUCAP in the last 168 hours.  Review of Systems: Positives in [place]   Gen: Denies fever, chills, weight change, fatigue, night sweats HEENT: Denies blurred vision, double vision, hearing loss, tinnitus, sinus congestion, rhinorrhea, sore throat, neck stiffness, dysphagia PULM: Denies shortness of breath, cough, sputum production, hemoptysis, wheezing CV: Denies chest pain, edema, orthopnea, paroxysmal nocturnal dyspnea, palpitations GI: Denies abdominal pain, nausea, vomiting, diarrhea, hematochezia, melena, constipation, change in bowel habits GU: Denies dysuria, hematuria, polyuria, oliguria, urethral discharge Endocrine: Denies hot or cold intolerance, polyuria, polyphagia or appetite change Derm: Denies rash, dry skin, scaling or peeling skin change Heme: Denies easy bruising, bleeding, bleeding gums Neuro: Denies headache, numbness, weakness, slurred speech, loss of memory or consciousness   Past Medical History:  He,  has a past medical history of Anxiety, Arthritis, Complication of anesthesia, COPD, severe ([place]), Depression, Essential hypertension, Hearing loss of both ears, History of arterial bypass of  lower extremity, History of CHF (congestive heart failure), History of DVT of lower extremity, History of panic attacks, Hyperlipidemia with target LDL less than 70, Left main coronary artery disease (2004), Neuropathy, peripheral, No natural teeth, Nocturia, OSA (obstructive sleep apnea), Peripheral arterial occlusive disease ([place]), PTSD (post-traumatic stress disorder), Recurrent productive cough, S/P CABG x 4 (07/2002), Smoker unmotivated to quit, 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   Surgical History:   Past Surgical History:  Procedure Laterality Date   ABDOMINAL AORTOGRAM N/A 11/12/2017   Procedure: ABDOMINAL AORTOGRAM;  Surgeon: Waynetta Sandy, MD;  Location: [place] [facility];  Service: Cardiovascular;  Laterality: N/A;   ABDOMINAL AORTOGRAM W/LOWER EXTREMITY Left 02/10/2018   Procedure: ABDOMINAL AORTOGRAM W/LOWER EXTREMITY;  Surgeon: Serafina Mitchell, MD;  Location: [facility];  Service: Cardiovascular;  Laterality: Left;   APPLICATION OF WOUND VAC Left 12/03/2017   Procedure: EXCHANGE OF WOUND VAC LEFT GROIN,;  Surgeon: Rosetta Posner, MD;  Location: [facility];  Service: Vascular;  Laterality: Left;   CARDIAC CATHETERIZATION  07/13/2002   Ischemia RCA region on Cardiolite// 60-70% ostial left main, 50% midCx, 60-70% mid RI,  70% mRCA, 90% JeNu and crux 75% RCA ,  95% PLA//  severe LM and 3Vessel CAD, perserved LV, ef 60%   COLONOSCOPY  2008    WNL   CORONARY ARTERY BYPASS GRAFT  07/27/2002   LIMA-LAD, freeRIMA-RI,SVG-OM2, SVG-rPDA; Dr. Roxan Hockey   CYSTOSCOPY W/ URETERAL STENT PLACEMENT N/A 02/21/2015   Procedure: BILATERAL RETROGRADE PYELOGRAM;  Surgeon: Festus Aloe, MD;  Location: [facility];  Service: Urology;  Laterality: N/A;   ENDARTERECTOMY FEMORAL Left 11/17/2017   Procedure: LEFT Femoral Endarterectomy with Patch Angioplasty;  Surgeon: Rosetta Posner, MD;  Location: [facility] OR;  Service: Vascular;  Laterality: Left;    EYE SURGERY Left 05/2012   FEMORAL ARTERY - POPLITEAL ARTERY BYPASS GRAFT Right 10-24-2008   dr early   w/ GoreTex graft   FEMORAL-POPLITEAL BYPASS GRAFT Left 02/11/2018   Procedure: BYPASS GRAFT LOWER EXTREMITY;  Surgeon: Donnetta Hutching,  Arvilla Meres, MD;  Location: [place];  Service: Vascular;  Laterality: Left;   FEMORAL-TIBIAL BYPASS GRAFT Right 01/10/2014   Procedure: Right Femoral to Posterior Tibial Bypass Graft using Left Leg Vein, Thrombectomy Right Common Femoral and Profunda of Leg . ;  Surgeon: Rosetta Posner, MD;  Location: [place];  Service: Vascular;  Laterality: Right;   HYDROCELE EXCISION Left    LEFT ANKLE SURGERY  1990's   LEFT HEART CATHETERIZATION WITH CORONARY/GRAFT ANGIOGRAM N/A 07/12/2014   Procedure: LEFT HEART CATHETERIZATION WITH Beatrix Fetters;  Surgeon: Troy Sine, MD;  Location: [facility] C[facility] LAB;  Service: Cardiovascular;  Laterality: N/A;  pLM 70%, pLAD 40-50%- patent LIMA-LAD.  RI -competitive flow noted from fRIMA-RI graft, circumflex, normal caliber with moderate OM1.  OM 2 occluded.  SVG-OM 2 patent. RCA CTO after large RVM, faint R-R and LAD septal-PDA collateral   LOWER EXTREMITY ANGIOGRAPHY Bilateral 11/12/2017   Procedure: LOWER EXTREMITY ANGIOGRAPHY - Left Lower;  Surgeon: Waynetta Sandy, MD;  Location: [place] [facility];  Service: Cardiovascular;  Laterality: Bilateral;   NM MYOVIEW LTD  11/'15; 3/'19, 5/'20   a) Mod sized, mod intensity/partially reversible inferior defect c/w  prior infarct mild/mod peri-infarct ischemia.  Inferoseptal HK.  INTERMEDIATE RISK. --> Cath showed CTO of Native RCA & SVG-rPDA.; b) EF ~42%. Inferior-Inferoseptal defect - likely old scar - INTERMEDIATE RISK b/c low EF (Echo EF 50-55%) - med Rx; c) [place] -by report - Non-ischemic.   PERIPHERAL VASCULAR INTERVENTION Left 02/10/2018   Procedure: PERIPHERAL VASCULAR INTERVENTION;  Surgeon: Serafina Mitchell, MD;  Location: [facility];  Service: Cardiovascular;   Laterality: Left;  left common iliac stent, attempted left SFA stent   RIGHT ABOVE KNEE POPLITEAL GRAFT TO BELOW KNEE POPLITEAL ARTERY BYPASS WITH REVERSE SAPHENOUS VEIN  03/02/2010   RIGHT/LEFT HEART CATH AND CORONARY/GRAFT ANGIOGRAPHY N/A 09/12/2021   Procedure: RIGHT/LEFT HEART CATH AND CORONARY/GRAFT ANGIOGRAPHY;  Surgeon: Larey Dresser, MD;  Location: [facility];  Service: Cardiovascular;  Laterality: N/A;   THROMBECTOMY OF RIGHT FEMORAL TO ABOVE KNEE POPLITEAL GORETEX GRAFT ANGIOPLASTY OF GORETEX AND SAPHENOUS VEIN JUNCTION AND ABOVE KNEE POPLITEAL ARTERY   10-06-2010   DR EARLY   TONSILLECTOMY     TRANSTHORACIC ECHOCARDIOGRAM  09/2017   EF 50-55%.  More accurate - Mild inferolateral HK (c/w known RCA occlusion).  GR 1 DD.  Mild LA dilation mildly increased PA pressures with mild RV/RA dilation.   TRANSURETHRAL RESECTION OF BLADDER TUMOR N/A 02/21/2015   Procedure: TRANSURETHRAL RESECTION OF BLADDER TUMOR (TURBT);  Surgeon: Festus Aloe, MD;  Location: [facility];  Service: Urology;  Laterality: N/A;   VIDEO ASSISTED THORACOSCOPY (VATS)/WEDGE RESECTION Right 05-01-2011    FORSYTH   LOWER LOBECTOMY W/  NODE DISSECTION   WOUND EXPLORATION Left 11/28/2017   Procedure: WOUND EXPLORATION GROIN, WASHOUT AND WOUND VAC APPLICATION;  Surgeon: Conrad Rowesville, MD;  Location: [place];  Service: Vascular;  Laterality: Left;     Social History:   reports that he has been smoking cigarettes. He started smoking about 64 years ago. He has a 60.00 pack-year smoking history. He has never used smokeless tobacco. He reports that he does not drink alcohol and does not use drugs.   Family History:  His family history includes Alzheimer's disease (age of onset: 57) in his father; Other (age of onset: 33) in his mother; Stroke in his father and mother.   Allergies Allergies  Allergen Reactions  Aspirin Other (See Comments)    Avoids due to severe bruises   Crestor [Rosuvastatin  Calcium] Other (See Comments)    myalgia   Lipitor [Atorvastatin Calcium] Other (See Comments)    myalgia   Zetia [Ezetimibe] Other (See Comments)    myalgia   Zocor [Simvastatin - High Dose] Other (See Comments)    myalgia   Aripiprazole Other (See Comments)    Altered mental status   Effexor [Venlafaxine Hydrochloride] Itching   Morphine And Related Itching     Home Medications  Prior to Admission medications   Medication Sig Start Date End Date Taking? Authorizing Provider  acetaminophen (TYLENOL) 500 MG tablet Take 1,000 mg by mouth every 6 (six) hours as needed for moderate pain or headache.    Yes [provider]  albuterol (PROVENTIL HFA;VENTOLIN HFA) 108 (90 BASE) MCG/ACT inhaler Inhale 1 puff into the lungs every 6 (six) hours as needed for wheezing or shortness of breath.   Yes [provider]  albuterol (PROVENTIL) (2.5 MG/3ML) 0.083% nebulizer solution Take 2.5 mg by nebulization every 6 (six) hours as needed for wheezing or shortness of breath. 03/15/18  Yes [provider]  aspirin EC 81 MG tablet Take 1 tablet (81 mg total) by mouth daily. Swallow whole. 07/27/21  Yes Donato Heinz, MD  cholecalciferol (VITAMIN D) 25 MCG (1000 UNIT) tablet Take 1,000 Units by mouth in the morning. 04/20/21  Yes [provider]  CYMBALTA 60 MG capsule Take 60 mg by mouth in the morning. 10/31/19  Yes [provider]  furosemide (LASIX) 20 MG tablet Take 40 mg by mouth in the morning.   Yes [provider]  nitroGLYCERIN (NITROSTAT) 0.4 MG SL tablet Place 1 tablet (0.4 mg total) under the tongue every 5 (five) minutes as needed for chest pain. 08/28/17  Yes Leonie Man, MD  prazosin (MINIPRESS) 1 MG capsule Take 1 mg by mouth at bedtime.   Yes [provider]  QUEtiapine Fumarate (SEROQUEL XR) 150 MG 24 hr tablet Take 150 mg by mouth at bedtime.   Yes [provider]  ramipril (ALTACE) 2.5 MG capsule Take 2.5 mg  by mouth every morning.    Yes [provider]  Tiotropium Bromide Monohydrate 2.5 MCG/ACT AERS Inhale 2 puffs into the lungs in the morning.   Yes [provider]  traZODone (DESYREL) 100 MG tablet Take 100 mg by mouth at bedtime.   Yes [provider]  Grant Ruts INHUB 250-50 MCG/ACT AEPB Inhale 1 puff into the lungs 2 (two) times daily. 07/25/21  Yes [provider]  furosemide (LASIX) 40 MG tablet Take 1 tablet (40 mg total) by mouth daily. Patient not taking: Reported on 09/07/2021 07/27/21   Donato Heinz, MD  OXYGEN Inhale 4 L into the lungs continuous. Uses 4L at bedtime and if needed throughout the day -- intolerant to CPAP    [provider]     Critical care time: n/a    Noe Gens, MSN, APRN, NP-C, AGACNP-BC [facility] 09/14/2021, 9:41 AM   Please see [email or web address] for pager details.   From 7A-7P if no response, please call [phone] After hours, please call Warren Lacy [phone]   Pulmonary critical care attending:  This is a 81 year old gentleman admitted for respiratory failure, has chronic hypoxemic respiratory failure at baseline acute exacerbation of COPD, hypertension secondary to lung disease, acute on chronic systolic heart failure atrial fibrillation.  BP 103/60    Pulse Marland Kitchen)  117    Temp (!) 97.5 F (36.4 C) (Oral)    Resp (!) 28    Ht 6' (1.829 m)    Wt 73.1 kg    SpO2 98%    BMI 21.86 kg/m    General: Notably frail gentleman, resting comfortably in bed on nasal cannula HEENT: Alert oriented tracking appropriately, NCAT Heart: Regular rhythm S1-S2 Lungs: Diminished breath sounds bilaterally no wheeze Abdomen: Soft nontender nondistended  Labs: Reviewed  Assessment: Acute on chronic hypoxemic respiratory failure Severe COPD with acute exacerbation Severe centrilobular Fatima History of tobacco abuse History of pulmonary hypertension secondary to COPD Lung nodule followed outpatient by  Dr. Halford Chessman Acute on chronic systolic heart failure Atrial fibrillation  Plan: Start triple therapy nebs. Continue to wean from baseline. Start Brovana Pulmicort and Yupelri Continue IV steroids, reduced to 40 mg IV daily. Up with PT, OT with assistance only. BiPAP nightly Heart failure regimen per cardiology. We appreciate consultation.  [facility] 09/14/2021 11:05 AM

## 2021-09-14 NOTE — TOC Initial Note (Addendum)
Transition of Care (TOC) - Initial/Assessment Note  ? ? ?Patient Details  ?Name: John Ball ?MRN: [id number] ?Date of Birth: 1941/03/03 ? ?Transition of Care [facility]) CM/SW Contact:    ?Marcheta Grammes Rexene Alberts, RN ?Phone Number: [phone] ?09/14/2021, 10:59 AM ? ?Clinical Narrative:                 ?HF TOC CM spoke to pt at bedside. Requested CM speak to wife. Attempted call to wife, left message for return call.  ? ?09/14/2021 1105 am Spoke to wife and offered choice for [facility]. Wife declines HH at this time. States she weighs him daily, checks his ankles, pulse ox, and blood pressure. Explains she can follow up with PCP if she decides on [facility] and they can order for office. States she will call his PCP if she decides on [facility]. States pt has refillable portable tanks at home but they do not last long. Will have [facility] deliver a portable to room for dc. Wife states they use [facility] for meds or [facility]. T[employer] covers his meds. Pt had CPAP at home in the past but did not wear. Pt wears his oxygen at night.  ? ?Will check copay for Eliquis.  Eliquis returns a copay of $38 #60/30 days ? ?Will have [employer] rep, Zach evaluate for Home Bipap.  ? ?Expected Discharge Plan: [place] ?Barriers to Discharge: Continued Medical Work up ? ? ?Patient Goals and CMS Choice ?Patient states their goals for this hospitalization and ongoing recovery are:: wants to feel better ?CMS Medicare.gov Compare Post Acute Care list provided to:: Patient Represenative (must comment) ?Choice offered to / list presented to : Spouse ? ?Expected Discharge Plan and Services ?Expected Discharge Plan: [place] ?  ?Discharge Planning Services: CM Consult ?Post Acute Care Choice: Home Health ?Living arrangements for the past 2 months: [place] ?                ?  ?  ?  ?  ?  ?  ?  ?  ?  ?  ? ?Prior Living Arrangements/Services ?Living arrangements for the past 2 months: [place] ?Lives with::  Spouse ?Patient language and need for interpreter reviewed:: Yes ?Do you feel safe going back to the place where you live?: Yes      ?Need for Family Participation in Patient Care: Yes (Comment) ?Care giver support system in place?: Yes (comment) ?Current home services: DME (rolling walker, bedside commode, cane, oxygen ([employer]), wheelchair, nebulizer machine) ?Criminal Activity/Legal Involvement Pertinent to Current Situation/Hospitalization: No - Comment as needed ? ?Activities of Daily Living ?  ?  ? ?Permission Sought/Granted ?Permission sought to share information with : Case Manager, PCP, Family Supports ?Permission granted to share information with : Yes, Verbal Permission Granted ? Share Information with NAME: John Ball ? Permission granted to share info w AGENCY: [place], DME, [place] ? Permission granted to share info w Relationship: wife ? Permission granted to share info w Contact Information: [phone] ? ?Emotional Assessment ?Appearance:: Appears stated age ?Attitude/Demeanor/Rapport: Engaged ?Affect (typically observed): Accepting ?Orientation: : Oriented to Self, Oriented to Place, Oriented to  Time, Oriented to Situation ?  ?Psych Involvement: No (comment) ? ?Admission diagnosis:  CHF (congestive heart failure) ([place]) [I50.9] ?Patient Active Problem List  ? Diagnosis Date Noted  ? CHF (congestive heart failure) ([place]) 09/12/2021  ? Dependence on supplemental oxygen 08/27/2021  ? Acute  on chronic congestive heart failure ([place]) 07/17/2021  ? Postherpetic neuralgia 03/05/2019  ? Shingles 03/05/2019  ? Personal history of noncompliance with medical treatment 12/07/2018  ? Chronic respiratory failure with hypoxia ([place]) 09/15/2018  ? 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)   ? post op Wound infection 11/28/2017  ? Sepsis ([place]) 11/28/2017  ? PAD (peripheral artery disease) ([place]) 11/17/2017  ? Pneumonia of right lower lobe due to infectious  organism 09/22/2017  ? SOB (shortness of breath) 09/12/2017  ? PTSD (post-traumatic stress disorder) 09/10/2017  ? OSA (obstructive sleep apnea) 09/10/2017  ? No natural teeth 09/10/2017  ? Hearing loss of both ears 09/10/2017  ? 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 Bipolar I disorder ([place]) 05/07/2017  ? COPD exacerbation ([place]) 07/21/2016  ? Acute on chronic respiratory failure with hypoxia ([place]) 07/21/2016  ? Chronic diastolic heart failure ([place]) 07/21/2016  ? 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 Abnormal stress test   ? Coronary artery disease involving coronary bypass graft of native heart with angina pectoris ([place])   ? Peripheral arterial occlusive disease ([place])   ? Hyperlipidemia with target LDL less than 70   ? Smoker unmotivated to quit   ? Essential hypertension   ? COPD (chronic obstructive pulmonary disease) ([place])   ? Pain of left lower extremity 02/01/2014  ? Lung cancer ([place]) 05/15/2011  ? Squamous cell lung cancer ([place]) 05/05/2011  ? Left main coronary artery disease 07/08/2002  ? 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/2002  ? ?PCP:  Ronnell Freshwater, NP ?Pharmacy:   ?[id number] [place] [place] ?[place] ?[place] [address] ?Phone: [phone] Fax: [phone] ? ?[place], [place] ?[address] ?[place] [address] ?Phone: [phone] Fax: [phone] ? ? ? ? ?Social Determinants of Health (SDOH) Interventions ?  ? ?Readmission Risk Interventions ?No flowsheet data found. ? ? ?

## 2021-09-15 DIAGNOSIS — J449 Chronic obstructive pulmonary disease, unspecified: Secondary | ICD-10-CM | POA: Diagnosis not present

## 2021-09-15 DIAGNOSIS — J9622 Acute and chronic respiratory failure with hypercapnia: Secondary | ICD-10-CM

## 2021-09-15 DIAGNOSIS — J441 Chronic obstructive pulmonary disease with (acute) exacerbation: Secondary | ICD-10-CM | POA: Diagnosis not present

## 2021-09-15 DIAGNOSIS — J9621 Acute and chronic respiratory failure with hypoxia: Secondary | ICD-10-CM | POA: Diagnosis not present

## 2021-09-15 DIAGNOSIS — R0603 Acute respiratory distress: Secondary | ICD-10-CM

## 2021-09-15 LAB — BASIC METABOLIC PANEL
Anion gap: 11 (ref 5–15)
BUN: 26 mg/dL — ABNORMAL HIGH (ref 8–23)
CO2: 33 mmol/L — ABNORMAL HIGH (ref 22–32)
Calcium: 9.6 mg/dL (ref 8.9–10.3)
Chloride: 91 mmol/L — ABNORMAL LOW (ref 98–111)
Creatinine, Ser: 1.4 mg/dL — ABNORMAL HIGH (ref 0.61–1.24)
GFR, Estimated: 51 mL/min — ABNORMAL LOW (ref >60)
Glucose, Bld: 133 mg/dL — ABNORMAL HIGH (ref 70–99)
Potassium: 4.7 mmol/L (ref 3.5–5.1)
Sodium: 135 mmol/L (ref 135–145)

## 2021-09-15 LAB — COOXEMETRY PANEL
Carboxyhemoglobin: 1.6 % — ABNORMAL HIGH (ref 0.5–1.5)
Methemoglobin: <0.7 % (ref 0.0–1.5)
O2 Saturation: 53.3 %
Total hemoglobin: 13.2 g/dL (ref 12.0–16.0)

## 2021-09-15 LAB — CBC
HCT: 39.1 % (ref 39.0–52.0)
Hemoglobin: 12.9 g/dL — ABNORMAL LOW (ref 13.0–17.0)
MCH: 35.8 pg — ABNORMAL HIGH (ref 26.0–34.0)
MCHC: 33 g/dL (ref 30.0–36.0)
MCV: 108.6 fL — ABNORMAL HIGH (ref 80.0–100.0)
Platelets: 89 10*3/uL — ABNORMAL LOW (ref 150–400)
RBC: 3.6 MIL/uL — ABNORMAL LOW (ref 4.22–5.81)
RDW: 14.6 % (ref 11.5–15.5)
WBC: 3.8 10*3/uL — ABNORMAL LOW (ref 4.0–10.5)
nRBC: 0 % (ref 0.0–0.2)

## 2021-09-15 LAB — MAGNESIUM: Magnesium: 2.3 mg/dL (ref 1.7–2.4)

## 2021-09-15 LAB — DIGOXIN LEVEL: Digoxin Level: 0.4 ng/mL — ABNORMAL LOW (ref 0.8–2.0)

## 2021-09-15 MED ORDER — PREDNISONE 20 MG PO TABS
30.0000 mg | ORAL_TABLET | Freq: Every day | ORAL | Status: DC
Start: 2021-09-16 — End: 2021-09-17
  Administered 2021-09-16 – 2021-09-17 (×2): 30 mg via ORAL
  Filled 2021-09-15 (×2): qty 1

## 2021-09-15 NOTE — Progress Notes (Addendum)
? ? Advanced Heart Failure Rounding Note ? ?PCP-Cardiologist: Donato Heinz, MD  ? ?Subjective:   ?3/8- Started milrinone 0.25 mcg. PICC placed. CO-OX 72%. Diuresing with IV lasix. Placed on  amio drip for A fib.  ? ?RHC/LHC  ?1. Elevated left > right heart filling pressures. PCWP 26>RA 9  ?2. Mixed pulmonary venous/pulmonary arterial hypertension.  The PAH component may be due to COPD with group 3 PH.  ?3. Low cardiac index.  ?4. Patent LIMA-LAD and free RIMA-ramus.  The RCA and SVG-RCA were known to the occluded.  The SVG-OM is newly-noted to be occluded (native LCX is occluded). No interventional target.  ? ?Increased WOB yesterday. Seen by Pulmonary. Started on steroids/nebs. Breathing better now. Denies CP or SOB. CVP 5  ? ? ?Objective:   ?Weight Range: ?73.1 kg ?Body mass index is 21.86 kg/m?.  ? ?Vital Signs:   ?Temp:  [97.7 ?F (36.5 ?C)-98.1 ?F (36.7 ?C)] 97.8 ?F (36.6 ?C) (03/11 1115) ?Pulse Rate:  [62-87] 77 (03/11 1115) ?Resp:  [17-24] 18 (03/11 1115) ?BP: (94-102)/(52-79) 94/79 (03/11 1115) ?SpO2:  [93 %-100 %] 96 % (03/11 1115) ?Last BM Date : 09/12/21 ? ?Weight change: ?Filed Weights  ? 09/12/21 1510 09/13/21 0411 09/14/21 0414  ?Weight: 77.4 kg 75.7 kg 73.1 kg  ? ? ?Intake/Output:  ? ?Intake/Output Summary (Last 24 hours) at 09/15/2021 1256 ?Last data filed at 09/15/2021 0310 ?Gross per 24 hour  ?Intake 298.63 ml  ?Output 2350 ml  ?Net -2051.37 ml  ? ?  ? ? ?Physical Exam  ? ?General:  Sitting in bed on HFLNC  ?HEENT: normal ?Neck: supple. no JVD. Carotids 2+ bilat; no bruits. No lymphadenopathy or thryomegaly appreciated. ?Cor: PMI nondisplaced. Regular rate & rhythm. No rubs, gallops or murmurs. ?Lungs: clear ?Abdomen: soft, nontender, nondistended. No hepatosplenomegaly. No bruits or masses. Good bowel sounds. ?Extremities: no cyanosis, clubbing, rash, 1+ ankle edema ?Neuro: alert & orientedx3, cranial nerves grossly intact. moves all 4 extremities w/o difficulty. Affect  pleasant ? ?Telemetry  ?SR 70-80s with APCs Personally reviewed ? ?Labs  ?  ?CBC ?Recent Labs  ?  09/14/21 ?0522 09/15/21 ?0510  ?WBC 3.6* 3.8*  ?HGB 11.0* 12.9*  ?HCT 33.6* 39.1  ?MCV 109.1* 108.6*  ?PLT 79* 89*  ? ? ?Basic Metabolic Panel ?Recent Labs  ?  09/14/21 ?0522 09/15/21 ?0510  ?NA 134* 135  ?K 3.7 4.7  ?CL 92* 91*  ?CO2 32 33*  ?GLUCOSE 234* 133*  ?BUN 21 26*  ?CREATININE 1.26* 1.40*  ?CALCIUM 8.7* 9.6  ?MG 1.9 2.3  ? ? ?Liver Function Tests ?Recent Labs  ?  09/12/21 ?1624  ?AST 18  ?ALT 14  ?ALKPHOS 45  ?BILITOT 0.5  ?PROT 6.1*  ?ALBUMIN 3.7  ? ? ?No results for input(s): LIPASE, AMYLASE in the last 72 hours. ?Cardiac Enzymes ?No results for input(s): CKTOTAL, CKMB, CKMBINDEX, TROPONINI in the last 72 hours. ? ?BNP: ?BNP (last 3 results) ?Recent Labs  ?  07/13/21 ?1210 09/12/21 ?1624  ?BNP 1,058.3* 2,341.0*  ? ? ? ?ProBNP (last 3 results) ?No results for input(s): PROBNP in the last 8760 hours. ? ? ?D-Dimer ?No results for input(s): DDIMER in the last 72 hours. ?Hemoglobin A1C ?No results for input(s): HGBA1C in the last 72 hours. ?Fasting Lipid Panel ?No results for input(s): CHOL, HDL, LDLCALC, TRIG, CHOLHDL, LDLDIRECT in the last 72 hours. ?Thyroid Function Tests ?No results for input(s): TSH, T4TOTAL, T3FREE, THYROIDAB in the last 72 hours. ? ?Invalid input(s): FREET3 ? ?Other  results: ? ? ?Imaging  ? ? ?No results found. ? ? ?Medications:   ? ? ?Scheduled Medications: ? amiodarone  200 mg Oral BID  ? apixaban  5 mg Oral BID  ? arformoterol  15 mcg Nebulization BID  ? budesonide (PULMICORT) nebulizer solution  0.5 mg Nebulization BID  ? Chlorhexidine Gluconate Cloth  6 each Topical Daily  ? digoxin  0.125 mg Oral Daily  ? DULoxetine  60 mg Oral Daily  ? empagliflozin  10 mg Oral Daily  ? mouth rinse  15 mL Mouth Rinse BID  ? methylPREDNISolone (SOLU-MEDROL) injection  40 mg Intravenous Daily  ? nicotine  14 mg Transdermal Daily  ? prazosin  1 mg Oral QHS  ? QUEtiapine  150 mg Oral QHS  ? revefenacin   175 mcg Nebulization Daily  ? sodium chloride flush  10-40 mL Intracatheter Q12H  ? spironolactone  25 mg Oral Daily  ? torsemide  20 mg Oral Daily  ? traZODone  100 mg Oral QHS  ? ? ?Infusions: ? ? ? ?PRN Medications: ?acetaminophen, ALPRAZolam, ondansetron (ZOFRAN) IV, sodium chloride flush ? ? ? ?Patient Profile  ?81 y/o male w/ h/o CAD s/p CABG in 2004 w/ LIMA-LAD, free RIMA-RI, SVG-OM2, SVG-RPDA, Tobacco use, severe COPD, h/o NSCLC s/p lobectomy in 2012, OSA on CPAP, PVD s/p Rt Fem-Pop bypass, Stage IIIa CKD, HTN, HLD and LE DVT.  ? ?Admitted with volume overload.  ? ? ?Assessment/Plan  ?  ?1. Acute on chronic systolic CHF: Echo in 3/76 with EF 20-25%, mildly dilated and severely dysfunctional RV, moderate MR.  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  Since that time, it appears that he has lost an SVG-OM and the LCx is known to be occluded.  He is also in atrial fibrillation which likely contributes to HF as well.  RHC today showed elevated L>R sided filling pressures, PAPI adequate.  CI was low at 1.68.   ?- Post cath started on  milrinone 0.25 mcg/kg/min. Now off milrinone Co-ox 53%. Continue to follow. Will not restart at this point  ?- CVP 5. Continue torsemide 20 mg daily. Renal function stable.   ?- Continue farxiga 10 mg daily  ?- Continue  digoxin 0.125 ?- Continue spiro 25 mg daily.  ?- No b-blocker yet with COPD and low output ?- Place TED hose ?2. Atrial fibrillation: He has been in atrial fibrillation since at least 1/23.  Rate has not been markedly high.  Last ECG with NSR was in 8/22.  AF likely has worsened CHF.   ?- Started on amio drip with conversion to SR ?- Maintaining NSR. Continue po amio  ? - Not good long-term option with severe COPD but will use at least in the short term.  ?- Continue Eliquis 5 mg bid.  ?3. CAD: Cath showed occluded RCA and SVG-RCA (known from prior), patent LIMA-LAD, occluded LCx, and occluded SVG-OM (new finding). Free  RIMA-ramus was patent.  New ostial occlusion of SVG-OM, no interventional option.  Plan medical management, no s/s angina ?- No ASA given Eliquis use.  ?- He has not tolerated statins or Zetia, at discharge would refer to pharmacy clinic for [place].  ?4. PAD: S/p right fem-pop bypass.  ?5. H/o lung cancer: Lobectomy 2012.  ?6. COPD: On 4L home oxygen. He is an active smoker though wife says he has cut back.  ?- nicotine patches in hospital. ?7. OSA: Use CPAP.  ?- continue ?8. 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  The PAH component may be due to COPD with group 3 PH.  ?9. PTSD: Continue home meds.  ?10. Thrombocytopenia: Mild, this appears chronic.  ?11. Acute on chronic hypoxic respiratory failure. On 4l O2 at home ?- suspect COPD main issue now ?- continue HFNC. Steroids and nebs.  ?- Appreciate Pulmonary input.  ? ? ?Length of Stay: 3 ? ?Glori Bickers, MD  ?09/15/2021, 12:56 PM ? ?Advanced Heart Failure Team ?Pager [phone] (M-F; 7a - 5p)  ?Please contact [facility] for night-coverage after hours (5p -7a ) and weekends on [email or web address] ? ?

## 2021-09-15 NOTE — Progress Notes (Signed)
? ?NAME:  John Ball, MRN:  [id number], DOB:  Nov 07, 1940, LOS: 3 ?ADMISSION DATE:  09/12/2021, CONSULTATION DATE: 3/10 ?REFERRING MD: John Ball, CHIEF COMPLAINT: Shortness of breath ? ?History of Present Illness:  ?81 year old male who presented to [facility] on 3/8 for planned left and right heart cath. ? ?Baseline pulmonary history-John Ball has known COPD with FEV1 of 36%, ratio 54% in 2017 on Wixela and Spiriva.  He is 4 L oxygen dependent.  He has known history of right lower lobe lobectomy for non-small cell lung cancer.  He has sleep apnea and uses CPAP.  The patient continues to smoke approximately quarter pack per day.  In January 2023 CT imaging revealed a new left lower lobe nodule that is being followed by John Ball. ? ?On arrival the patient reportedly had traveled for 1 hour without an oxygen tank.  He was hypoxic, diaphoretic on presentation was placed on 4 L per nasal cannula (patient's baseline) with improvement in symptoms.  He was admitted per the heart failure service with new onset atrial fibrillation.  R/LHC with elevated filling pressures and low output.  Mixed pulmonary venous/pulmonary arterial hypertension.  RCA and SVG-RCA with known occlusion and new occlusion of SVG-OM.  There were no targets for intervention.  The patient was started on milrinone, amiodarone infusion and Lasix.  The patient was noted to have increasing O2 requirements 3/10  ? ?PCCM consulted for evaluation. ? ?Pertinent  Medical History  ?CAD - s/p CABG ?HTN ?PVD - s/p R Fem-pop bypass ?OSA - on CPAP ?COPD - 2017 spirometry with FEV1 of 1.21 / 36%, ratio 54% ?CKD 3A ?DVT ? ?Significant Hospital Events: ?Including procedures, antibiotic start and stop dates in addition to other pertinent events   ?3/8 Admit after R/LHC.  New findings of SVG-OM occlusion, atrial fibrillation. ?3/9 NSR.  On milrinone and Lasix.  Spironolactone added.  Seen by rapid response overnight for respiratory distress and increased O2 needs. ?3/10  PCCM consulted for evaluation. Rx with 120 mg solumedrol ?3/11 No acute issues overnight remains on 8L HFNC ? ?Interim History / Subjective:  ?States he is not back to his baseline breathing yet  ? ?Objective   ?Blood pressure 102/60, pulse 85, temperature 97.8 ?F (36.6 ?C), temperature source Oral, resp. rate 19, height 6' (1.829 m), weight 73.1 kg, SpO2 94 %. ?CVP:  [5 mmHg-33 mmHg] 5 mmHg  ?FiO2 (%):  [50 %] 50 %  ? ?Intake/Output Summary (Last 24 hours) at 09/15/2021 0759 ?Last data filed at 09/15/2021 0310 ?Gross per 24 hour  ?Intake 616.44 ml  ?Output 2950 ml  ?Net -2333.56 ml  ? ? ?Filed Weights  ? 09/12/21 1510 09/13/21 0411 09/14/21 0414  ?Weight: 77.4 kg 75.7 kg 73.1 kg  ? ? ?Examination: ?General: Acute on chronically ill appearing elderly male sitting up in bedside recliner, in NAD ?HEENT: [place]/AT, MM pink/moist, PERRL,  ?Neuro: Alert and oriented x3, non-focal  ?CV: s1s2 regular rate and rhythm, no murmur, rubs, or gallops,  ?PULM:  Diminished bilaterally, very mild accessory muscle use seen with speaking, oxygen saturations mid 90' on 8L HFNC ?GI: soft, bowel sounds active in all 4 quadrants, non-tender, non-distended, tolerating oral diet  ?Extremities: warm/dry, no edema  ?Skin: no rashes or lesions ? ?Resolved Hospital Problem list   ? ? ?Assessment & Plan:  ? ?Acute on Chronic Hypoxic Respiratory Failure  ?-Exacerbated by not wearing O2, baseline 4L dependent ?Moderate-Severe COPD with Acute Exacerbation  ?Centrilobular Emphysema  ?Tobacco Abuse ?-Baseline FEV1 1.21 (  36%) in 2017 with ratio 54% > suspect he has progressed to severe since that time with ongoing tobacco abuse.  Exacerbated by hypoxia, drove to hospital for [facility] without oxygen.   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 ?OSA  ?P: ?Encouraged BIPAP at night and during periods of rest, states he did not use overnight  ? ?LLL Lung Nodule  ?P: ?Outpatient follow up, primary pulmonologist John Ball ? ?Acute on Chronic Systolic CHF ?Atrial Fibrillation  ?CAD  ?PAD  ?P: ?Management per primary team  ? ? ?Best Practice (right click and "Reselect all SmartList Selections" daily)  ?Per Primary  ? ?Critical care time: n/a  ?John Neuhaus D. Harris, NP-C ?[facility] ?Personal contact information can be found on Amion  ?09/15/2021, 8:06 AM ? ? ? ?

## 2021-09-15 NOTE — Plan of Care (Signed)

## 2021-09-15 NOTE — Plan of Care (Signed)
  Problem: Education: Goal: Ability to demonstrate management of disease process will improve Outcome: Progressing Goal: Individualized Educational Video(s) Outcome: Progressing   Problem: Cardiac: Goal: Ability to achieve and maintain adequate cardiopulmonary perfusion will improve Outcome: Progressing   

## 2021-09-16 DIAGNOSIS — J441 Chronic obstructive pulmonary disease with (acute) exacerbation: Secondary | ICD-10-CM | POA: Diagnosis not present

## 2021-09-16 LAB — CBC
HCT: 38.9 % — ABNORMAL LOW (ref 39.0–52.0)
Hemoglobin: 13.1 g/dL (ref 13.0–17.0)
MCH: 36.1 pg — ABNORMAL HIGH (ref 26.0–34.0)
MCHC: 33.7 g/dL (ref 30.0–36.0)
MCV: 107.2 fL — ABNORMAL HIGH (ref 80.0–100.0)
Platelets: 95 10*3/uL — ABNORMAL LOW (ref 150–400)
RBC: 3.63 MIL/uL — ABNORMAL LOW (ref 4.22–5.81)
RDW: 14.6 % (ref 11.5–15.5)
WBC: 4.8 10*3/uL (ref 4.0–10.5)
nRBC: 0 % (ref 0.0–0.2)

## 2021-09-16 LAB — COOXEMETRY PANEL
Carboxyhemoglobin: 1.6 % — ABNORMAL HIGH (ref 0.5–1.5)
Methemoglobin: <0.7 % (ref 0.0–1.5)
O2 Saturation: 69.8 %
Total hemoglobin: 13.2 g/dL (ref 12.0–16.0)

## 2021-09-16 LAB — BASIC METABOLIC PANEL
Anion gap: 13 (ref 5–15)
BUN: 28 mg/dL — ABNORMAL HIGH (ref 8–23)
CO2: 31 mmol/L (ref 22–32)
Calcium: 9.5 mg/dL (ref 8.9–10.3)
Chloride: 86 mmol/L — ABNORMAL LOW (ref 98–111)
Creatinine, Ser: 1.44 mg/dL — ABNORMAL HIGH (ref 0.61–1.24)
GFR, Estimated: 50 mL/min — ABNORMAL LOW (ref >60)
Glucose, Bld: 118 mg/dL — ABNORMAL HIGH (ref 70–99)
Potassium: 4.5 mmol/L (ref 3.5–5.1)
Sodium: 130 mmol/L — ABNORMAL LOW (ref 135–145)

## 2021-09-16 LAB — MAGNESIUM: Magnesium: 2.3 mg/dL (ref 1.7–2.4)

## 2021-09-16 NOTE — Progress Notes (Signed)
? ? Advanced Heart Failure Rounding Note ? ?PCP-Cardiologist: Donato Heinz, MD  ? ?Subjective:   ?3/8- Started milrinone 0.25 mcg. PICC placed. CO-OX 72%. Diuresing with IV lasix. Placed on  amio drip for A fib.  ? ?RHC/LHC  ?1. Elevated left > right heart filling pressures. PCWP 26>RA 9  ?2. Mixed pulmonary venous/pulmonary arterial hypertension.  The PAH component may be due to COPD with group 3 PH.  ?3. Low cardiac index.  ?4. Patent LIMA-LAD and free RIMA-ramus.  The RCA and SVG-RCA were known to the occluded.  The SVG-OM is newly-noted to be occluded (native LCX is occluded). No interventional target.  ? ?Milrinone stopped on 3/10. Co-ox 70% today.  ? ?Increased WOB yesterday. Seen by Pulmonary. Started on steroids/nebs. Breathing better now. Remains on 8L HFNC. Denies CP or SOB. CVP 5  Say he wants to go home  ? ? ?Objective:   ?Weight Range: ?75.1 kg ?Body mass index is 22.45 kg/m?.  ? ?Vital Signs:   ?Temp:  [97.5 ?F (36.4 ?C)-98 ?F (36.7 ?C)] 97.9 ?F (36.6 ?C) (03/12 0726) ?Pulse Rate:  [67-82] 82 (03/12 0726) ?Resp:  [14-18] 17 (03/12 0726) ?BP: (94-114)/(59-79) 108/76 (03/12 0726) ?SpO2:  [94 %-100 %] 95 % (03/12 0839) ?Weight:  [75.1 kg] 75.1 kg (03/12 0016) ?Last BM Date : 09/12/21 ? ?Weight change: ?Filed Weights  ? 09/13/21 0411 09/14/21 0414 09/16/21 0016  ?Weight: 75.7 kg 73.1 kg 75.1 kg  ? ? ?Intake/Output:  ? ?Intake/Output Summary (Last 24 hours) at 09/16/2021 1051 ?Last data filed at 09/16/2021 0920 ?Gross per 24 hour  ?Intake 660 ml  ?Output 1950 ml  ?Net -1290 ml  ? ?  ? ? ?Physical Exam  ? ?General:  Sitting in chair on HFLNC. Chronically ill appearing   ?HEENT: normal ?Neck: supple. no JVD. Carotids 2+ bilat; no bruits. No lymphadenopathy or thryomegaly appreciated. ?Cor: PMI nondisplaced. Regular rate & rhythm. No rubs, gallops or murmurs. ?Lungs: Decreased throughout  ?Abdomen: soft, nontender, nondistended. No hepatosplenomegaly. No bruits or masses. Good bowel  sounds. ?Extremities: no cyanosis, clubbing, rash, tr edema + TED hose ?Neuro: alert & orientedx3, cranial nerves grossly intact. moves all 4 extremities w/o difficulty. Affect pleasant ? ? ?Telemetry  ? SR 80s Personally reviewed ? ? ?Labs  ?  ?CBC ?Recent Labs  ?  09/15/21 ?0510 09/16/21 ?0355  ?WBC 3.8* 4.8  ?HGB 12.9* 13.1  ?HCT 39.1 38.9*  ?MCV 108.6* 107.2*  ?PLT 89* 95*  ? ? ?Basic Metabolic Panel ?Recent Labs  ?  09/15/21 ?0510 09/16/21 ?0355  ?NA 135 130*  ?K 4.7 4.5  ?CL 91* 86*  ?CO2 33* 31  ?GLUCOSE 133* 118*  ?BUN 26* 28*  ?CREATININE 1.40* 1.44*  ?CALCIUM 9.6 9.5  ?MG 2.3 2.3  ? ? ?Liver Function Tests ?No results for input(s): AST, ALT, ALKPHOS, BILITOT, PROT, ALBUMIN in the last 72 hours. ? ?No results for input(s): LIPASE, AMYLASE in the last 72 hours. ?Cardiac Enzymes ?No results for input(s): CKTOTAL, CKMB, CKMBINDEX, TROPONINI in the last 72 hours. ? ?BNP: ?BNP (last 3 results) ?Recent Labs  ?  07/13/21 ?1210 09/12/21 ?1624  ?BNP 1,058.3* 2,341.0*  ? ? ? ?ProBNP (last 3 results) ?No results for input(s): PROBNP in the last 8760 hours. ? ? ?D-Dimer ?No results for input(s): DDIMER in the last 72 hours. ?Hemoglobin A1C ?No results for input(s): HGBA1C in the last 72 hours. ?Fasting Lipid Panel ?No results for input(s): CHOL, HDL, LDLCALC, TRIG, CHOLHDL, LDLDIRECT in the  last 72 hours. ?Thyroid Function Tests ?No results for input(s): TSH, T4TOTAL, T3FREE, THYROIDAB in the last 72 hours. ? ?Invalid input(s): FREET3 ? ?Other results: ? ? ?Imaging  ? ? ?No results found. ? ? ?Medications:   ? ? ?Scheduled Medications: ? amiodarone  200 mg Oral BID  ? apixaban  5 mg Oral BID  ? arformoterol  15 mcg Nebulization BID  ? budesonide (PULMICORT) nebulizer solution  0.5 mg Nebulization BID  ? Chlorhexidine Gluconate Cloth  6 each Topical Daily  ? digoxin  0.125 mg Oral Daily  ? DULoxetine  60 mg Oral Daily  ? empagliflozin  10 mg Oral Daily  ? mouth rinse  15 mL Mouth Rinse BID  ? nicotine  14 mg Transdermal  Daily  ? prazosin  1 mg Oral QHS  ? predniSONE  30 mg Oral Q breakfast  ? QUEtiapine  150 mg Oral QHS  ? revefenacin  175 mcg Nebulization Daily  ? sodium chloride flush  10-40 mL Intracatheter Q12H  ? spironolactone  25 mg Oral Daily  ? torsemide  20 mg Oral Daily  ? traZODone  100 mg Oral QHS  ? ? ?Infusions: ? ? ? ?PRN Medications: ?acetaminophen, ALPRAZolam, ondansetron (ZOFRAN) IV, sodium chloride flush ? ? ? ?Patient Profile  ?81 y/o male w/ h/o CAD s/p CABG in 2004 w/ LIMA-LAD, free RIMA-RI, SVG-OM2, SVG-RPDA, Tobacco use, severe COPD, h/o NSCLC s/p lobectomy in 2012, OSA on CPAP, PVD s/p Rt Fem-Pop bypass, Stage IIIa CKD, HTN, HLD and LE DVT.  ? ?Admitted with volume overload.  ? ? ?Assessment/Plan  ?  ?1. Acute on chronic systolic CHF: Echo in 3/29 with EF 20-25%, mildly dilated and severely dysfunctional RV, moderate MR.  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  Since that time, it appears that he has lost an SVG-OM and the LCx is known to be occluded.  He is also in atrial fibrillation which likely contributes to HF as well.  RHC today showed elevated L>R sided filling pressures, PAPI adequate.  CI was low at 1.68.   ?- Post cath started on  milrinone 0.25 mcg/kg/min. Now off milrinone Co-ox 70%. Continue to follow. ?- CVP stable at  5. Continue torsemide 20 mg daily. Renal function stable.   ?- Continue farxiga 10 mg daily  ?- Continue  digoxin 0.125 ?- Continue spiro 25 mg daily.  ?- No b-blocker yet with COPD and low output ?- Continue TED hose ?2. Atrial fibrillation: He has been in atrial fibrillation since at least 1/23.  Rate has not been markedly high.  Last ECG with NSR was in 8/22.  AF likely has worsened CHF.   ?- Started on amio drip with conversion to SR ?- Maintaining NSR. Continue po amio  ? - Not good long-term option with severe COPD but will use at least in the short term.  ?- Continue Eliquis 5 mg bid. No bleeding ?3. CAD: Cath showed  occluded RCA and SVG-RCA (known from prior), patent LIMA-LAD, occluded LCx, and occluded SVG-OM (new finding). Free RIMA-ramus was patent.  New ostial occlusion of SVG-OM, no interventional option.  Plan medical management, no s/s angina ?- No ASA given Eliquis use.  ?- He has not tolerated statins or Zetia, at discharge would refer to pharmacy clinic for [place].  ?4. PAD: S/p right fem-pop bypass.  ?5. H/o lung cancer: Lobectomy 2012.  ?6. COPD: On 4L home oxygen. He is an active smoker though wife says  he has cut back.  ?- nicotine patches in hospital. ?7. OSA: Use CPAP.  ?- continue ?8. 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  The PAH component may be due to COPD with group 3 PH.  ?9. PTSD: Continue home meds.  ?10. Thrombocytopenia: Mild, this appears chronic.  ?11. Acute on chronic hypoxic respiratory failure. On 4l O2 at home ?- suspect COPD main issue now ?- continue HFNC. Steroids and nebs.  ?- Appreciate Pulmonary input.  ?- Continue current management. ?- D/w Dr. Halford Chessman.  ? ? ?Length of Stay: 4 ? ?Glori Bickers, MD  ?09/16/2021, 10:51 AM ? ?Advanced Heart Failure Team ?Pager [phone] (M-F; 7a - 5p)  ?Please contact [facility] for night-coverage after hours (5p -7a ) and weekends on [email or web address] ? ?

## 2021-09-16 NOTE — Progress Notes (Signed)
? ?NAME:  John Ball, MRN:  [id number], DOB:  Feb 08, 1941, LOS: 4 ?ADMISSION DATE:  09/12/2021, CONSULTATION DATE: 3/10 ?REFERRING MD: Dr. Aundra Dubin, CHIEF COMPLAINT: Shortness of breath ? ?History of Present Illness:  ?81 year old male who presented to [facility] on 3/[facility] for planned left and right heart cath. ? ?Baseline pulmonary history-[name] has known COPD with FEV1 of 36%, ratio 54% in 2017 on Wixela and Spiriva.  He is 4 L oxygen dependent.  He has known history of right lower lobe lobectomy for non-small cell lung cancer.  He has sleep apnea and uses CPAP.  The patient continues to smoke approximately quarter pack per day.  In January 2023 CT imaging revealed a new left lower lobe nodule that is being followed by Dr. Halford Chessman. ? ?On arrival the patient reportedly had traveled for 1 hour without an oxygen tank.  He was hypoxic, diaphoretic on presentation was placed on 4 L per nasal cannula (patient's baseline) with improvement in symptoms.  He was admitted per the heart failure service with new onset atrial fibrillation.  R/LHC with elevated filling pressures and low output.  Mixed pulmonary venous/pulmonary arterial hypertension.  RCA and SVG-RCA with known occlusion and new occlusion of SVG-OM.  There were no targets for intervention.  The patient was started on milrinone, amiodarone infusion and Lasix.  The patient was noted to have increasing O2 requirements 3/10  ? ?PCCM consulted for evaluation. ? ?Pertinent  Medical History  ?CAD - s/p CABG ?HTN ?PVD - s/p R Fem-pop bypass ?OSA - on CPAP ?COPD - 2017 spirometry with FEV1 of 1.21 / 36%, ratio 54% ?CKD 3A ?DVT ? ?Significant Hospital Events: ?Including procedures, antibiotic start and stop dates in addition to other pertinent events   ?3/8 Admit after R/LHC.  New findings of SVG-OM occlusion, atrial fibrillation. ?3/9 NSR.  On milrinone and Lasix.  Spironolactone added.  Seen by rapid response overnight for respiratory distress and increased O2 needs. ?3/10  PCCM consulted for evaluation. Rx with 120 mg solumedrol ?3/11 No acute issues overnight remains on 8L HFNC ? ?Interim History / Subjective:  ?Broke his cvp tubing. Has ripped of leads, monitoring equipment  ? ?Changing from neb to HFNC ?Objective   ?Blood pressure 108/76, pulse 82, temperature 97.9 ?F (36.6 ?C), temperature source Oral, resp. rate 17, height 6' (1.829 m), weight 75.1 kg, SpO2 95 %. ?CVP:  [4 mmHg-5 mmHg] 4 mmHg  ?   ? ?Intake/Output Summary (Last 24 hours) at 09/16/2021 1136 ?Last data filed at 09/16/2021 0920 ?Gross per 24 hour  ?Intake 660 ml  ?Output 1950 ml  ?Net -1290 ml  ? ?Filed Weights  ? 09/13/21 0411 09/14/21 0414 09/16/21 0016  ?Weight: 75.7 kg 73.1 kg 75.1 kg  ? ? ?Examination: ?General: Chronicall ill elderly M standing NAD ?HEENT: NCAT pink mm ?Neuro: AAOx2 following commands. Hard of hearing.  ?CV: cap refill < 3 sec  ?PULM: diminished but clear bilaterally. Finishing neb.  ?GI: soft ndnt  ?Extremities: no acute deformity no cyanosis or clubbing  ?Skin: c/d/w ? ?Resolved Hospital Problem list   ? ? ?Assessment & Plan:  ? ? ?Acute on chronic hypoxic respiratory failure ?AECOPD with underlying severe COPD due to emphysema  ?Tobacco abuse ?OSA  ?LLL lung nodule  ?P: ?-comt brovana, pulmicort, yupelri + PRN albuterol ?-beginning pred wean 3/12 (pred down to 30mg  qD)  ?PRN BiPAP ?-wean O2 for SpO2 goal 88-92%  ? ?Acute on Chronic Systolic CHF ?Atrial Fibrillation  ?CAD  ?PAD  ?P: ?  Management per primary team  ? ? ?Best Practice (right click and "Reselect all SmartList Selections" daily)  ?Per Primary  ? ?Critical care time: n/a  ? ?Eliseo Gum MSN, AGACNP-BC ?[facility] ?Amion for pager  ?09/16/2021, 11:36 AM ? ? ? ?

## 2021-09-17 ENCOUNTER — Other Ambulatory Visit (HOSPITAL_COMMUNITY): Payer: Self-pay

## 2021-09-17 ENCOUNTER — Telehealth: Payer: Self-pay | Admitting: Student

## 2021-09-17 DIAGNOSIS — R0603 Acute respiratory distress: Secondary | ICD-10-CM | POA: Diagnosis not present

## 2021-09-17 LAB — COOXEMETRY PANEL
Carboxyhemoglobin: 1.8 % — ABNORMAL HIGH (ref 0.5–1.5)
Methemoglobin: 0.8 % (ref 0.0–1.5)
O2 Saturation: 69 %
Total hemoglobin: 12.9 g/dL (ref 12.0–16.0)

## 2021-09-17 LAB — BASIC METABOLIC PANEL
Anion gap: 10 (ref 5–15)
BUN: 33 mg/dL — ABNORMAL HIGH (ref 8–23)
CO2: 34 mmol/L — ABNORMAL HIGH (ref 22–32)
Calcium: 9.8 mg/dL (ref 8.9–10.3)
Chloride: 91 mmol/L — ABNORMAL LOW (ref 98–111)
Creatinine, Ser: 1.44 mg/dL — ABNORMAL HIGH (ref 0.61–1.24)
GFR, Estimated: 50 mL/min — ABNORMAL LOW (ref >60)
Glucose, Bld: 112 mg/dL — ABNORMAL HIGH (ref 70–99)
Potassium: 4.5 mmol/L (ref 3.5–5.1)
Sodium: 135 mmol/L (ref 135–145)

## 2021-09-17 LAB — CBC
HCT: 37.4 % — ABNORMAL LOW (ref 39.0–52.0)
Hemoglobin: 12.7 g/dL — ABNORMAL LOW (ref 13.0–17.0)
MCH: 36.7 pg — ABNORMAL HIGH (ref 26.0–34.0)
MCHC: 34 g/dL (ref 30.0–36.0)
MCV: 108.1 fL — ABNORMAL HIGH (ref 80.0–100.0)
Platelets: 94 10*3/uL — ABNORMAL LOW (ref 150–400)
RBC: 3.46 MIL/uL — ABNORMAL LOW (ref 4.22–5.81)
RDW: 14.5 % (ref 11.5–15.5)
WBC: 5.2 10*3/uL (ref 4.0–10.5)
nRBC: 0 % (ref 0.0–0.2)

## 2021-09-17 LAB — MAGNESIUM: Magnesium: 2.4 mg/dL (ref 1.7–2.4)

## 2021-09-17 MED ORDER — AMIODARONE HCL 200 MG PO TABS
200.0000 mg | ORAL_TABLET | Freq: Two times a day (BID) | ORAL | 1 refills | Status: DC
  Filled 2021-09-17: qty 45, 30d supply, fill #0

## 2021-09-17 MED ORDER — APIXABAN 5 MG PO TABS: 5.0000 mg | ORAL_TABLET | Freq: Two times a day (BID) | ORAL | 5 refills | Status: DC

## 2021-09-17 MED ORDER — SPIRONOLACTONE 25 MG PO TABS: 25.0000 mg | ORAL_TABLET | Freq: Every day | ORAL | 5 refills | Status: DC

## 2021-09-17 MED ORDER — AMIODARONE HCL 200 MG PO TABS: 200.0000 mg | ORAL_TABLET | Freq: Two times a day (BID) | ORAL | 1 refills | Status: DC

## 2021-09-17 MED ORDER — SPIRONOLACTONE 25 MG PO TABS
25.0000 mg | ORAL_TABLET | Freq: Every day | ORAL | 5 refills | Status: DC
  Filled 2021-09-17: qty 30, 30d supply, fill #0

## 2021-09-17 MED ORDER — LOSARTAN POTASSIUM 25 MG PO TABS
12.5000 mg | ORAL_TABLET | Freq: Every day | ORAL | Status: DC
Start: 2021-09-17 — End: 2021-09-17
  Administered 2021-09-17: 12.5 mg via ORAL
  Filled 2021-09-17: qty 1

## 2021-09-17 MED ORDER — TORSEMIDE 20 MG PO TABS
10.0000 mg | ORAL_TABLET | Freq: Every day | ORAL | 5 refills | Status: DC
  Filled 2021-09-17: qty 30, 60d supply, fill #0

## 2021-09-17 MED ORDER — TORSEMIDE 20 MG PO TABS: 10.0000 mg | ORAL_TABLET | Freq: Every day | ORAL | 5 refills | Status: DC

## 2021-09-17 MED ORDER — EMPAGLIFLOZIN 10 MG PO TABS
10.0000 mg | ORAL_TABLET | Freq: Every day | ORAL | 5 refills | Status: DC
  Filled 2021-09-17: qty 14, 14d supply, fill #0

## 2021-09-17 MED ORDER — DIGOXIN 125 MCG PO TABS: 0.1250 mg | ORAL_TABLET | Freq: Every day | ORAL | 5 refills | Status: DC

## 2021-09-17 MED ORDER — DIGOXIN 125 MCG PO TABS
0.1250 mg | ORAL_TABLET | Freq: Every day | ORAL | 5 refills | Status: DC
  Filled 2021-09-17: qty 30, 30d supply, fill #0

## 2021-09-17 MED ORDER — LOSARTAN POTASSIUM 25 MG PO TABS
12.5000 mg | ORAL_TABLET | Freq: Every day | ORAL | 5 refills | Status: DC
Start: 2021-09-18 — End: 2021-09-17
  Filled 2021-09-17: qty 30, 60d supply, fill #0

## 2021-09-17 MED ORDER — SODIUM CHLORIDE 0.9 % IV SOLN
INTRAVENOUS | Status: DC
Start: 2021-09-17 — End: 2021-09-17

## 2021-09-17 MED ORDER — APIXABAN 5 MG PO TABS
5.0000 mg | ORAL_TABLET | Freq: Two times a day (BID) | ORAL | 5 refills | Status: DC
  Filled 2021-09-17: qty 60, 30d supply, fill #0

## 2021-09-17 MED ORDER — PREDNISONE 10 MG PO TABS
ORAL_TABLET | ORAL | 0 refills | Status: AC
  Filled 2021-09-17 (×2): qty 15, 8d supply, fill #0

## 2021-09-17 MED ORDER — LOSARTAN POTASSIUM 25 MG PO TABS: 12.5000 mg | ORAL_TABLET | Freq: Every day | ORAL | 5 refills | Status: DC

## 2021-09-17 MED ORDER — EMPAGLIFLOZIN 10 MG PO TABS: 10.0000 mg | ORAL_TABLET | Freq: Every day | ORAL | 5 refills | Status: DC

## 2021-09-17 NOTE — Consult Note (Signed)
? ?  [facility] CM Inpatient Consult ? ? ?09/17/2021 ? ?John Ball ?[birth year] ?[id number] ? ?[facility] [ACO] Patient: Medicare ACO Reach ? ?Primary Care Provider:  Ronnell Freshwater, NP, Primary Care at [facility] ? ? ?Patient evaluated for community based chronic disease management services with [place] [facility] as a benefit of patient's [employer].  Patient will receive post hospital discharge call  for [facility] and for assessments fo community needs for care/disease management.   ? ? ?Plan: Referral request for Transition of Care call and follow up ? ?Of note, [facility] Care Management services does not replace or interfere with any services that are arranged by inpatient Transition of Care [TOC] team    ? ?For additional questions or referrals please contact:   ? ?Natividad Brood, RN BSN CCM ?[facility] Liaison ? [phone] business mobile phone ?Toll free office [phone]  ?Fax number: [phone] ?[email or web address]  [email or web address] ? ? ?

## 2021-09-17 NOTE — TOC Progression Note (Signed)
Transition of Care [facility]) - Progression Note    Patient Details  Name: John Ball MRN: [id number] Date of Birth: 1940/11/01  Transition of Care [facility]) CM/SW Contact  Sherrilyn Rist [facility] Supervisor Phone Number: [phone] 09/17/2021, 1:42 PM  Clinical Narrative:    Patient is for possible discharge home today. Has home 02 through [facility]. Zack with [employer] called to deliver a portable 02 tank to the room for discharge.   Expected Discharge Plan: [place] Barriers to Discharge: Continued Medical Work up  Expected Discharge Plan and Services Expected Discharge Plan: [place]   Discharge Planning Services: CM Consult Post Acute Care Choice: [place] arrangements for the past 2 months: Single Family Home                                       Social Determinants of Health (SDOH) Interventions    Readmission Risk Interventions No flowsheet data found.

## 2021-09-17 NOTE — Progress Notes (Signed)
Pulmonary doctor John Ball just left stated he will return when he can get a better assessment of his oxygenation status. ? ?Wife arrives afterwards.  ? ?Nicotine patch placed on right deltoid. He is hard of hearing but his wife states his hearing aids are at home ?Dentures are at home because patient does not wear them. ?

## 2021-09-17 NOTE — Progress Notes (Signed)
Central Telemetry Alischa called twice about leads off patient. I put them back one time and again patient pulled them off. At this time NT Otila Kluver is in room replacing leads. Alinda [employer] controller was notified as well.  ? ?Pt is stable and alert and sitting up in the chair. No signs nor symptoms of distress. ?

## 2021-09-17 NOTE — Progress Notes (Signed)
Patient wanted to take a nap in chair. Placed on CPAP with 6 LPM oxygen bled in. Told patient to call when ready to come off so we could place him back on nasal cannula. Vitals stable. Patient comfortable.  ?

## 2021-09-17 NOTE — Progress Notes (Signed)
Physical Therapy Treatment ?Patient Details ?Name: John Ball ?MRN: [id number] ?DOB: 05/10/1941 ?Today's Date: 09/17/2021 ? ? ?History of Present Illness 81 yo admitted 3/8 with volume overload, CHF, with new Afib s/p heart cath 3/8. PMHx:CAD s/p CABG, Tobacco use, severe COPD, h/o NSCLC s/p lobectomy, OSA on CPAP, PVD, CKD, HTN, HLD, LE DVT, and PTSD ? ?  ?PT Comments  ? ? The pt was received sitting in the recliner on 4L O2. He is eager to return home at this time, and asked repeatedly about leaving hospital through session. Attempted to elicit SpO2 reading with mobility to determine if 4L is sufficient for mobility in the home. However, pt limited to ~15 ft ambulation at a time with increased work of breathing and minA to steady while walking with BUE support. No reliable pleth during mobility, but SpO2 low of 85% after mobility with decent pleth. Will continue to benefit from HHPT as pt with poor endurance, power, and dynamic stability at this time.  ?  ?Recommendations for follow up therapy are one component of a multi-disciplinary discharge planning process, led by the attending physician.  Recommendations may be updated based on patient status, additional functional criteria and insurance authorization. ? ?Follow Up Recommendations ? Home health PT ?  ?  ?Assistance Recommended at Discharge Frequent or constant Supervision/Assistance  ?Patient can return home with the following A little help with bathing/dressing/bathroom;A little help with walking and/or transfers;Assistance with cooking/housework;Assist for transportation;Direct supervision/assist for medications management ?  ?Equipment Recommendations ? None recommended by PT  ?  ?Recommendations for Other Services   ? ? ?  ?Precautions / Restrictions Precautions ?Precautions: Fall;Other (comment) ?Precaution Comments: watch sats. LUE post heart cath 3/8 ?Restrictions ?Weight Bearing Restrictions: No  ?  ? ?Mobility ? Bed Mobility ?Overal bed mobility:  Needs Assistance ?  ?  ?  ?  ?  ?  ?General bed mobility comments: Up in chair upon arrival ?  ? ?Transfers ?Overall transfer level: Needs assistance ?Equipment used: 1 person hand held assist ?Transfers: Sit to/from Stand ?Sit to Stand: Min guard ?  ?  ?  ?  ?  ?General transfer comment: standing with BUE on armrests. then minA to steady in standing while pt reaching for RW ?  ? ?Ambulation/Gait ?Ambulation/Gait assistance: Min assist ?Gait Distance (Feet): 15 Feet (+ 75ft) ?Assistive device: Rolling walker (2 wheels) ?Gait Pattern/deviations: Step-through pattern, Decreased stride length ?Gait velocity: decreased ?Gait velocity interpretation: <1.31 ft/sec, indicative of household ambulator ?  ?General Gait Details: pt with lateral sway and inconsistent step length. reaching for UE support on door, counter, and not responding to cues to maintain RW close by ? ? ? ? ? ?  ?Balance Overall balance assessment: Needs assistance ?Sitting-balance support: Feet supported ?Sitting balance-Leahy Scale: Fair ?Sitting balance - Comments: EOB without support ?  ?Standing balance support: Single extremity supported ?Standing balance-Leahy Scale: Fair ?Standing balance comment: single UE support for limited standing ?  ?  ?  ?  ?  ?  ?  ?  ?  ?  ?  ?  ? ?  ?Cognition Arousal/Alertness: Awake/alert ?Behavior During Therapy: Restless, Impulsive ?Overall Cognitive Status: Within Functional Limits for tasks assessed ?  ?  ?  ?  ?  ?  ?  ?  ?  ?  ?  ?  ?  ?  ?  ?  ?General Comments: Extremely hard of hearing, pt stuck on leaving and asking PT how he is getting to the  car. Pt also asking for a cigarette at end of session ?  ?  ? ?  ?Exercises   ? ?  ?General Comments General comments (skin integrity, edema, etc.): unable to get reliable pleth. SpO2 fro m79-98% without reliable reading. pt with increased work of breathing, on 4L ?  ?  ? ?Pertinent Vitals/Pain Pain Assessment ?Pain Assessment: No/denies pain  ? ? ? ?PT Goals (current  goals can now be found in the care plan section) Acute Rehab PT Goals ?Patient Stated Goal: return home ?PT Goal Formulation: With patient/family ?Time For Goal Achievement: 09/27/21 ?Potential to Achieve Goals: Fair ?Progress towards PT goals: Progressing toward goals ? ?  ?Frequency ? ? ? Min 3X/week ? ? ? ?  ?PT Plan Current plan remains appropriate  ? ? ?   ?AM-PAC PT "6 Clicks" Mobility   ?Outcome Measure ? Help needed turning from your back to your side while in a flat bed without using bedrails?: A Little ?Help needed moving from lying on your back to sitting on the side of a flat bed without using bedrails?: A Little ?Help needed moving to and from a bed to a chair (including a wheelchair)?: A Little ?Help needed standing up from a chair using your arms (e.g., wheelchair or bedside chair)?: A Little ?Help needed to walk in hospital room?: Total ?Help needed climbing 3-5 steps with a railing? : Total ?6 Click Score: 14 ? ?  ?End of Session Equipment Utilized During Treatment: Oxygen ?Activity Tolerance: Patient limited by fatigue ?Patient left: with call bell/phone within reach;in chair;with nursing/sitter in room;with family/visitor present ?Nurse Communication: Mobility status ?PT Visit Diagnosis: Other abnormalities of gait and mobility (R26.89);Difficulty in walking, not elsewhere classified (R26.2);Muscle weakness (generalized) (M62.81) ?  ? ? ?Time: 1561-5379 ?PT Time Calculation (min) (ACUTE ONLY): 26 min ? ?Charges:  $Therapeutic Exercise: 8-22 mins ?$Therapeutic Activity: 8-22 mins          ?          ? ?West Carbo, PT, DPT  ? ?Acute Rehabilitation Department ?Pager #: [phone] - [phone] ? ? ?Sandra Cockayne ?09/17/2021, 2:19 PM ? ?

## 2021-09-17 NOTE — Progress Notes (Signed)
Patient ID: John Ball, male   DOB: [birth year], 81 y.o.   MRN: [id number] ?  ? ? Advanced Heart Failure Rounding Note ? ?PCP-Cardiologist: Donato Heinz, MD  ? ?Subjective:   ? ?RHC/LHC  ?1. Elevated left > right heart filling pressures. PCWP 26>RA 9  ?2. Mixed pulmonary venous/pulmonary arterial hypertension.  The PAH component may be due to COPD with group 3 PH.  ?3. Low cardiac index.  ?4. Patent LIMA-LAD and free RIMA-ramus.  The RCA and SVG-RCA were known to the occluded.  The SVG-OM is newly-noted to be occluded (native LCX is occluded). No interventional target.  ? ?Milrinone stopped on 3/10. Co-ox 69% today. He is in NSR. CVP 7-8, no po diuretics.  ? ?He says that breathing feels better. Currently on prednisone 30.  On oxygen 5L [place], has been wearing 4L at home.  Wants to go home.  ? ? ?Objective:   ?Weight Range: ?73.6 kg ?Body mass index is 22 kg/m?.  ? ?Vital Signs:   ?Temp:  [97.5 ?F (36.4 ?C)-98.3 ?F (36.8 ?C)] 97.9 ?F (36.6 ?C) (03/13 4854) ?Pulse Rate:  [63-90] 84 (03/13 0723) ?Resp:  [20-21] 20 (03/13 0723) ?BP: (108-122)/(73-82) 120/73 (03/13 0723) ?SpO2:  [91 %-100 %] 91 % (03/13 0723) ?Weight:  [73.6 kg] 73.6 kg (03/13 0246) ?Last BM Date : 09/15/21 ? ?Weight change: ?Filed Weights  ? 09/14/21 0414 09/16/21 0016 09/17/21 0246  ?Weight: 73.1 kg 75.1 kg 73.6 kg  ? ? ?Intake/Output:  ? ?Intake/Output Summary (Last 24 hours) at 09/17/2021 1031 ?Last data filed at 09/17/2021 [phone] ?Gross per 24 hour  ?Intake 480 ml  ?Output 1275 ml  ?Net -795 ml  ?  ? ? ?Physical Exam  ? ?General: NAD ?Neck: No JVD, no thyromegaly or thyroid nodule.  ?Lungs: Decreased BS bilaterally.  ?CV: Nondisplaced PMI.  Heart regular S1/S2, no S3/S4, no murmur.  1+ edema 1/2 to knees bilaterally.  ?Abdomen: Soft, nontender, no hepatosplenomegaly, no distention.  ?Skin: Intact without lesions or rashes.  ?Neurologic: Alert and oriented x 3.  ?Psych: Normal affect. ?Extremities: No clubbing or cyanosis.  ?HEENT: Normal.   ? ?Telemetry  ? ?SR 80s Personally reviewed ? ?Labs  ?  ?CBC ?Recent Labs  ?  09/16/21 ?0355 09/17/21 ?0441  ?WBC 4.8 5.2  ?HGB 13.1 12.7*  ?HCT 38.9* 37.4*  ?MCV 107.2* 108.1*  ?PLT 95* 94*  ? ?Basic Metabolic Panel ?Recent Labs  ?  09/16/21 ?0355 09/17/21 ?0441  ?NA 130* 135  ?K 4.5 4.5  ?CL 86* 91*  ?CO2 31 34*  ?GLUCOSE 118* 112*  ?BUN 28* 33*  ?CREATININE 1.44* 1.44*  ?CALCIUM 9.5 9.8  ?MG 2.3 2.4  ? ?Liver Function Tests ?No results for input(s): AST, ALT, ALKPHOS, BILITOT, PROT, ALBUMIN in the last 72 hours. ? ?No results for input(s): LIPASE, AMYLASE in the last 72 hours. ?Cardiac Enzymes ?No results for input(s): CKTOTAL, CKMB, CKMBINDEX, TROPONINI in the last 72 hours. ? ?BNP: ?BNP (last 3 results) ?Recent Labs  ?  07/13/21 ?1210 09/12/21 ?1624  ?BNP 1,058.3* 2,341.0*  ? ? ?ProBNP (last 3 results) ?No results for input(s): PROBNP in the last 8760 hours. ? ? ?D-Dimer ?No results for input(s): DDIMER in the last 72 hours. ?Hemoglobin A1C ?No results for input(s): HGBA1C in the last 72 hours. ?Fasting Lipid Panel ?No results for input(s): CHOL, HDL, LDLCALC, TRIG, CHOLHDL, LDLDIRECT in the last 72 hours. ?Thyroid Function Tests ?No results for input(s): TSH, T4TOTAL, T3FREE, THYROIDAB in the last 72  hours. ? ?Invalid input(s): FREET3 ? ?Other results: ? ? ?Imaging  ? ? ?No results found. ? ? ?Medications:   ? ? ?Scheduled Medications: ? amiodarone  200 mg Oral BID  ? apixaban  5 mg Oral BID  ? arformoterol  15 mcg Nebulization BID  ? budesonide (PULMICORT) nebulizer solution  0.5 mg Nebulization BID  ? Chlorhexidine Gluconate Cloth  6 each Topical Daily  ? digoxin  0.125 mg Oral Daily  ? DULoxetine  60 mg Oral Daily  ? empagliflozin  10 mg Oral Daily  ? losartan  12.5 mg Oral Daily  ? mouth rinse  15 mL Mouth Rinse BID  ? nicotine  14 mg Transdermal Daily  ? prazosin  1 mg Oral QHS  ? predniSONE  30 mg Oral Q breakfast  ? QUEtiapine  150 mg Oral QHS  ? revefenacin  175 mcg Nebulization Daily  ? sodium  chloride flush  10-40 mL Intracatheter Q12H  ? spironolactone  25 mg Oral Daily  ? torsemide  20 mg Oral Daily  ? traZODone  100 mg Oral QHS  ? ? ?Infusions: ? ? ? ?PRN Medications: ?acetaminophen, ALPRAZolam, ondansetron (ZOFRAN) IV, sodium chloride flush ? ? ? ?Patient Profile  ?81 y/o male w/ h/o CAD s/p CABG in 2004 w/ LIMA-LAD, free RIMA-RI, SVG-OM2, SVG-RPDA, Tobacco use, severe COPD, h/o NSCLC s/p lobectomy in 2012, OSA on CPAP, PVD s/p Rt Fem-Pop bypass, Stage IIIa CKD, HTN, HLD and LE DVT.  ? ?Admitted with volume overload.  ? ? ?Assessment/Plan  ?  ?1. Acute on chronic systolic CHF: Echo in 7/93 with EF 20-25%, mildly dilated and severely dysfunctional RV, moderate MR.  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  Since that time, it appears that he has lost an SVG-OM and the LCx is known to be occluded.  He was also in atrial fibrillation which likely contributes to HF as well.  RHC last week showed elevated L>R sided filling pressures, PAPI adequate. CI was low at 1.68.  Post cath started on milrinone 0.25 mcg/kg/min. Now off milrinone, co-ox 69%. CVP 7-8 today.  ?- Continue torsemide 20 mg daily.   ?- Continue farxiga 10 mg daily  ?- Continue  digoxin 0.125 ?- Continue spiro 25 mg daily.  ?- No b-blocker yet with COPD and low output ?- Continue TED hose => compression stockings at home.  ?- Will not start Entresto yet as he is going home today.  Will start losartan 12.5 mg daily with plan to transition to Entresto at followup appt if BP stable.  ?2. Atrial fibrillation: He has been in atrial fibrillation since at least 1/23.  Rate has not been markedly high.  Last ECG with NSR was in 8/22.  AF likely has worsened CHF.  He converted to NSR on amiodarone, in NSR today.  ?- Continue amiodarone 200 mg bid x 10 days then 200 mg daily. Not good long-term option with severe COPD but will use at least in the short term.  ?- Continue Eliquis 5 mg bid. No bleeding ?3.  CAD: Cath showed occluded RCA and SVG-RCA (known from prior), patent LIMA-LAD, occluded LCx, and occluded SVG-OM (new finding). Free RIMA-ramus was patent.  New ostial occlusion of SVG-OM, no interventional option.  Plan medical management, no chest pain.  ?- No ASA given Eliquis use.  ?- He has not tolerated statins or Zetia, at discharge would refer to pharmacy clinic for [place].  ?4. PAD: S/p right fem-pop  bypass.  ?5. H/o lung cancer: Lobectomy 2012.  ?6. COPD: On 4L home oxygen. He is an active smoker though wife says he has cut back. AECOPD this admission. Breathing improved, on steroids per pulmonary.  ?- nicotine patches in hospital. ?- Prednisone taper per pulmonary.  ?- Will need close followup with Dr. Halford Chessman.  ?7. OSA: Does not tolerate CPAP well.  ?- Followup with pulmonary as outpatient on this issue.  ?8. 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  The PAH component may be due to COPD with group 3 PH.  ?9. PTSD: Continue home meds.  ?10. Thrombocytopenia: Mild, this appears chronic.  ?11. Disposition: Can go home today on 4L home oxygen.  Will need close followup (7-10 days) CHF clinic.  Will need followup with Dr. Halford Chessman in pulmonary clinic.  Cardiac meds for home: Eliquis 5 mg bid, amiodarone 200 mg bid x 10 days then 200 mg daily, torsemide 10 mg daily, spironolactone 25 mg daily, Farxiga 10 mg daily, digoxin 0.125 daily, losartan 12.5 daily, prednisone taper per pulmonary.  ? ?Length of Stay: 5 ? ?Loralie Champagne, MD  ?09/17/2021, 10:31 AM ? ?Advanced Heart Failure Team ?Pager [phone] (M-F; 7a - 5p)  ?Please contact [facility] for night-coverage after hours (5p -7a ) and weekends on [email or web address] ? ?

## 2021-09-17 NOTE — Telephone Encounter (Signed)
10/09/21 with Sarah at 9:30 am.  ?

## 2021-09-17 NOTE — Care Management Important Message (Signed)
Important Message ? ?Patient Details  ?Name: John Ball ?MRN: [id number] ?Date of Birth: [birth year] ? ? ?Medicare Important Message Given:  Yes ? ? ? ? ?Shelda Altes ?09/17/2021, 11:19 AM ?

## 2021-09-17 NOTE — Telephone Encounter (Signed)
Can we set up an appointment for him with Dr. Soon in 2-4 weeks? Hospital follow up. ? ?Thanks! ?Nate ?

## 2021-09-17 NOTE — TOC Benefit Eligibility Note (Signed)
Patient Advocate Encounter ? ?Insurance verification completed.   ? ?The patient is currently admitted and upon discharge could be taking Entresto 24-26 mg. ? ?The current 30 day co-pay is, $38.00.  ? ?The patient is insured through [facility] is the only one who accepts T[employer]are)  ? ? ? ?Lyndel Safe, CPhT ?Pharmacy Patient Advocate Specialist ?[place] Patient Advocate Team ?Direct Number: [phone]  Fax: [phone] ? ? ? ? ? ?  ?

## 2021-09-17 NOTE — Progress Notes (Addendum)
? ?NAME:  John Ball, MRN:  [id number], DOB:  [birth year], LOS: 5 ?ADMISSION DATE:  09/12/2021, CONSULTATION DATE: 3/10 ?REFERRING MD: Dr. Aundra Dubin, CHIEF COMPLAINT: Shortness of breath ? ?History of Present Illness:  ?81 year old male who presented to [facility] on 3/8 for planned left and right heart cath. ? ?Baseline pulmonary history-John Ball has known COPD with FEV1 of 36%, ratio 54% in 2017 on Wixela and Spiriva.  He is 4 L oxygen dependent.  He has known history of right lower lobe lobectomy for non-small cell lung cancer.  He has sleep apnea and uses CPAP.  The patient continues to smoke approximately quarter pack per day.  In January 2023 CT imaging revealed a new left lower lobe nodule that is being followed by Dr. Halford Chessman. ? ?On arrival the patient reportedly had traveled for 1 hour without an oxygen tank.  He was hypoxic, diaphoretic on presentation was placed on 4 L per nasal cannula (patient's baseline) with improvement in symptoms.  He was admitted per the heart failure service with new onset atrial fibrillation.  R/LHC with elevated filling pressures and low output.  Mixed pulmonary venous/pulmonary arterial hypertension.  RCA and SVG-RCA with known occlusion and new occlusion of SVG-OM.  There were no targets for intervention.  The patient was started on milrinone, amiodarone infusion and Lasix.  The patient was noted to have increasing O2 requirements 3/10  ? ?PCCM consulted for evaluation. ? ?Pertinent  Medical History  ?CAD - s/p CABG ?HTN ?PVD - s/p R Fem-pop bypass ?OSA - on CPAP ?COPD - 2017 spirometry with FEV1 of 1.21 / 36%, ratio 54% ?CKD 3A ?DVT ? ?Significant Hospital Events: ?Including procedures, antibiotic start and stop dates in addition to other pertinent events   ?3/8 Admit after R/LHC.  New findings of SVG-OM occlusion, atrial fibrillation. ?3/9 NSR.  On milrinone and Lasix.  Spironolactone added.  Seen by rapid response overnight for respiratory distress and increased O2 needs. ?3/10  PCCM consulted for evaluation. Rx with 120 mg solumedrol ?3/11 No acute issues overnight remains on 8L HFNC ?3/13 90-92% on 4L O2. Liked CPAP  ? ?Interim History / Subjective:  ?Sitting up in chair comfortably this morning. Having trouble finding a pulse oximeter that works for him - cold hands and pulls off probes taped to his forehead, ear. ? ?Objective   ?Blood pressure 120/73, pulse 84, temperature 97.9 ?F (36.6 ?C), temperature source Oral, resp. rate 20, height 6' (1.829 m), weight 73.6 kg, SpO2 91 %. ?CVP:  [7 mmHg-9 mmHg] 7 mmHg  ?   ? ?Intake/Output Summary (Last 24 hours) at 09/17/2021 0927 ?Last data filed at 09/17/2021 7681 ?Gross per 24 hour  ?Intake 360 ml  ?Output 775 ml  ?Net -415 ml  ? ?Filed Weights  ? 09/14/21 0414 09/16/21 0016 09/17/21 0246  ?Weight: 73.1 kg 75.1 kg 73.6 kg  ? ? ?Examination: ?General: Chronicall ill elderly male seated upright in nad ?HEENT: NCAT pink mm ?Neuro: grossly nonfocal, attentive ?CV: BLE edema, cool ?PULM: faint rhonchi bilaterally. Normal WOB ?GI: soft ndnt  ?Extremities: no acute deformity no cyanosis or clubbing  ?Skin: c/d/w ? ?No new imaging ? ?BMP, CBC stable ? ?Resolved Hospital Problem list   ? ? ?Assessment & Plan:  ? ? 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 Halford Chessman can assess candidacy for either NIV for advanced COPD with hypercapnia or pursue PSG to eval for OSA in clinic in more stable state ?-wean O2 for SpO2 goal 88-92%, walking oximetry before discharge ?-appointment has been requested with Dr. Halford Chessman ? ?Acute on Chronic Systolic CHF ?Atrial Fibrillation  ?CAD   ?PAD  ?P: ?Management per primary team  ? ? ?Best Practice (right click and "Reselect all SmartList Selections" daily)  ?Per Primary  ? ?Critical care time: n/a  ? ?Will sign off, glad to be reinvolved as condition changes  ? ?[place]  ?[place] ? ?Amion for pager  ?09/17/2021, 9:27 AM ? ? ? ?

## 2021-09-17 NOTE — Discharge Summary (Cosign Needed)
Advanced Heart Failure Team  Discharge Summary   Patient ID: John Ball MRN: [id number], DOB/AGE: [birth year] 81 y.o. Admit date: 09/12/2021 D/C date:     09/17/2021   Primary Discharge Diagnoses:  Acute on Chronic Biventricular Heart Failure>>Low output Atrial Fibrillation  CAD h/o CABG Acute COPDE  Pulmonary HTN OSA PAD Thrombocytopenia H/o Lung Cancer  PTSD     Hospital Course:   81 y/o male w/ h/o CAD s/p CABG in 2004 w/ LIMA-LAD, free RIMA-RI, SVG-OM2, SVG-RPDA, Tobacco use, severe COPD, h/o NSCLC s/p lobectomy in 2012, OSA on CPAP, PVD s/p Rt Fem-Pop bypass, Stage IIIa CKD, HTN, HLD and LE DVT.    Had [place] in 2016 showed CTO of native RCA and SVG RCA, treated medically.    Prior echo in 2019 showed normal LVEF, 50-55%, RV normal.    Seen in ED 1/23 for SOB and diagnosed w/ CHF. Given IV Lasix, recommended admission but declined. Had post ED f/u w/ cardiology and remained symptomatic w/ volume overload. Admission again recommended but he declined. PO diuretics titrated. He was ordered to get repeat echo which showed drop in LVEF to 20-25%, GIIDD, RV severely reduced. Elevated RVSP 59 mmHg. Moderate MR/TR.   He was referred for subsequent R/LHC on 09/12/21. Angiography showed 2/4 patient grafts, patent LIMA-LAD and RIMA-RI, occluded SVG-PDA (known) and SVG-OM2 (newly occluded, native LCX was occluded). No interventional targets (medical management recommended). RHC showed elevated filling pressures and low output. mRAP 9, mPWP 26, FICK CO 3.29L/min, CI 1.68 L/min/BSA. Also noted to be in atrial fibrillation which was new. He was directly admitted from the cath lab for IV diuresis and inotropic support w/ milrinone. Also placed on IV amiodarone and Eliquis for atrial fibrillation.   He diuresed well w/ IV Lasix. He was weaned off milrinone and Co-ox remained stable. GDMT added. Transitioned to PO diuretic. He converted to NSR on IV amio and was transitioned to PO w/ plans to continue  200 mg bid x 10 days, followed by 200 mg daily thereafter.   He was also treated during admission for an acute COPD flare. Pulmonology was consulted to assist w/ management. He was treated w/ steroids and placed on a prednisone taper at discharge. Bronchodilators continued. Continuous Home O2 was arranged. Plan is to follow-up w/ Dr. Halford Chessman post discharge.    On 09/17/21, he was last seen and examined by Dr. Aundra Dubin and felt stable for d/c home. Post hospital f/u in the AHF clinic has been arranged on 09/27/21. F/u also arranged in outpatient pulmonology clinic.   See Detailed Hospital Problem List below 1. Acute on chronic systolic CHF: Echo in 6/31 with EF 20-25%, mildly dilated and severely dysfunctional RV, moderate MR.  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  Since that time, it appears that he has lost an SVG-OM and the LCx is known to be occluded.  He was also in atrial fibrillation which likely contributes to HF as well.  RHC last week showed elevated L>R sided filling pressures, PAPI adequate. CI was low at 1.68.  Post cath started on milrinone 0.25 mcg/kg/min. Now off milrinone, co-ox 69%. CVP 7-8 today.  - Continue torsemide 20 mg daily.   - Continue farxiga 10 mg daily  - Continue  digoxin 0.125 - Continue spiro 25 mg daily.  - No b-blocker yet with COPD and low output - Continue TED hose => compression stockings at home.  - Will not start Entresto yet as he  is going home today.  Will start losartan 12.5 mg daily with plan to transition to Entresto at followup appt if BP stable.  2. Atrial fibrillation: He has been in atrial fibrillation since at least 1/23.  Rate has not been markedly high.  Last ECG with NSR was in 8/22.  AF likely has worsened CHF.  He converted to NSR on amiodarone, in NSR today.  - Continue amiodarone 200 mg bid x 10 days then 200 mg daily. Not good long-term option with severe COPD but will use at least in the short term.   - Continue Eliquis 5 mg bid. No bleeding 3. CAD: Cath showed occluded RCA and SVG-RCA (known from prior), patent LIMA-LAD, occluded LCx, and occluded SVG-OM (new finding). Free RIMA-ramus was patent.  New ostial occlusion of SVG-OM, no interventional option.  Plan medical management, no chest pain.  - No ASA given Eliquis use.  - He has not tolerated statins or Zetia, at discharge would refer to pharmacy clinic for [place].  4. PAD: S/p right fem-pop bypass.  5. H/o lung cancer: Lobectomy 2012.  6. COPD: On 4L home oxygen. He is an active smoker though wife says he has cut back. AECOPD this admission. Breathing improved, on steroids per pulmonary.  - nicotine patches in hospital. - Prednisone taper per pulmonary.  - Will need close followup with Dr. Halford Chessman.  7. OSA: Does not tolerate CPAP well.  - Followup with pulmonary as outpatient on this issue.  8. 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  The PAH component may be due to COPD with group 3 PH.  9. PTSD: Continue home meds.  10. Thrombocytopenia: Mild, this appears chronic.  11. Disposition: Can go home today on 4L home oxygen.  Will need close followup (7-10 days) CHF clinic.  Will need followup with Dr. Halford Chessman in pulmonary clinic.  Cardiac meds for home: Eliquis 5 mg bid, amiodarone 200 mg bid x 10 days then 200 mg daily, torsemide 10 mg daily, spironolactone 25 mg daily, Farxiga 10 mg daily, digoxin 0.125 daily, losartan 12.5 daily, prednisone taper per pulmonary.     Discharge Weight Range: 162 lb Discharge Vitals: Blood pressure 111/60, pulse 66, temperature 97.7 F (36.5 C), temperature source Oral, resp. rate 20, height 6' (1.829 m), weight 73.6 kg, SpO2 99 %.  Labs: Lab Results  Component Value Date   WBC 5.2 09/17/2021   HGB 12.7 (L) 09/17/2021   HCT 37.4 (L) 09/17/2021   MCV 108.1 (H) 09/17/2021   PLT 94 (L) 09/17/2021    Recent Labs  Lab 09/12/21 1624 09/13/21 0500 09/17/21 0441   NA 137   < > 135  K 3.9   < > 4.5  CL 98   < > 91*  CO2 31   < > 34*  BUN 24*   < > 33*  CREATININE 1.20   < > 1.44*  CALCIUM 9.0   < > 9.8  PROT 6.1*  --   --   BILITOT 0.5  --   --   ALKPHOS 45  --   --   ALT 14  --   --   AST 18  --   --   GLUCOSE 124*   < > 112*   < > = values in this interval not displayed.   Lab Results  Component Value Date   CHOL 190 08/20/2021   HDL 53 08/20/2021   LDLCALC 118 08/20/2021   TRIG 107 08/20/2021   BNP (last  3 results) Recent Labs    07/13/21 1210 09/12/21 1624  BNP 1,058.3* 2,341.0*    ProBNP (last 3 results) No results for input(s): PROBNP in the last 8760 hours.   Diagnostic Studies/Procedures   2D  Echo 08/2021 Left ventricular ejection fraction, by estimation, is 20 to 25%. The left ventricle has severely decreased function. The left ventricle demonstrates global hypokinesis. Left ventricular diastolic parameters are consistent with Grade II diastolic dysfunction (pseudonormalization). Elevated left ventricular end-diastolic pressure. 1. Right ventricular systolic function is severely reduced. The right ventricular size is mildly enlarged. There is moderately elevated pulmonary artery systolic pressure. The estimated right ventricular systolic pressure is 48.2 mmHg. 2. 3. Left atrial size was severely dilated. The mitral valve is normal in structure. Moderate mitral valve regurgitation. No evidence of mitral stenosis. 4. 5. Tricuspid valve regurgitation is moderate. The aortic valve is tricuspid. Aortic valve regurgitation is mild. Aortic valve sclerosis/calcification is present, without any evidence of aortic stenosis. 6. Aortic dilatation noted. There is mild dilatation of the aortic root, measuring 39 mm. There is mild dilatation of the ascending aorta, measuring 43 mm. 7. The inferior vena cava is normal in size with greater than 50% respiratory variability, suggesting right atrial pressure of 3 mmHg.   R/LHC  09/12/21     Origin lesion is 100% stenosed.   Origin lesion is 100% stenosed.   Mid RCA lesion is 100% stenosed.   Prox RCA lesion is 70% stenosed.   Prox LAD to Mid LAD lesion is 80% stenosed.   Mid LAD lesion is 100% stenosed.   Prox Cx lesion is 100% stenosed.   Ost LM to Mid LM lesion is 70% stenosed.   RPAV lesion is 100% stenosed.   1. Elevated left > right heart filling pressures.  2. Mixed pulmonary venous/pulmonary arterial hypertension.  The PAH component may be due to COPD with group 3 PH.  3. Low cardiac index.  4. Patent LIMA-LAD and free RIMA-ramus.  The RCA and SVG-RCA were known to the occluded.  The SVG-OM is newly-noted to be occluded (native LCX is occluded). No interventional target.    Right Heart Pressures RHC Procedural Findings: Hemodynamics (mmHg) RA mean 9 RV 59/11 PA 66/36, mean 41 PCWP mean 26 LV 114/18 AO 118/62  Oxygen saturations: PA 62% AO 100%  Cardiac Output (Fick) 3.29  Cardiac Index (Fick) 1.68 PVR 4.5 WU    Discharge Medications   Allergies as of 09/17/2021       Reactions   Aspirin Other (See Comments)   Avoids due to severe bruises   Crestor [rosuvastatin Calcium] Other (See Comments)   myalgia   Lipitor [atorvastatin Calcium] Other (See Comments)   myalgia   Zetia [ezetimibe] Other (See Comments)   myalgia   Zocor [simvastatin - High Dose] Other (See Comments)   myalgia   Aripiprazole Other (See Comments)   Altered mental status   Effexor [venlafaxine Hydrochloride] Itching   Morphine And Related Itching        Medication List     STOP taking these medications    aspirin EC 81 MG tablet   furosemide 20 MG tablet Commonly known as: LASIX   furosemide 40 MG tablet Commonly known as: LASIX   ramipril 2.5 MG capsule Commonly known as: ALTACE       TAKE these medications    acetaminophen 500 MG tablet Commonly known as: TYLENOL Take 1,000 mg by mouth every 6 (six) hours as needed for moderate pain or  headache.   albuterol 108 (90 Base) MCG/ACT inhaler Commonly known as: VENTOLIN HFA Inhale 1 puff into the lungs every 6 (six) hours as needed for wheezing or shortness of breath.   albuterol (2.5 MG/3ML) 0.083% nebulizer solution Commonly known as: PROVENTIL Take 2.5 mg by nebulization every 6 (six) hours as needed for wheezing or shortness of breath.   amiodarone 200 MG tablet Commonly known as: PACERONE Take 1 tablet twice a day for 10 days, then reduce to 1 tablet once daily   cholecalciferol 25 MCG (1000 UNIT) tablet Commonly known as: VITAMIN D Take 1,000 Units by mouth in the morning.   Cymbalta 60 MG capsule Generic drug: DULoxetine Take 60 mg by mouth in the morning.   digoxin 0.125 MG tablet Commonly known as: LANOXIN Take 1 tablet (0.125 mg total) by mouth daily. Start taking on: September 18, 2021   Eliquis 5 MG Tabs tablet Generic drug: apixaban Take 1 tablet (5 mg total) by mouth 2 (two) times daily.   apixaban 5 MG Tabs tablet Commonly known as: ELIQUIS Take 1 tablet (5 mg total) by mouth 2 (two) times daily.   Jardiance 10 MG Tabs tablet Generic drug: empagliflozin Take 1 tablet (10 mg total) by mouth daily. Start taking on: September 18, 2021   empagliflozin 10 MG Tabs tablet Commonly known as: JARDIANCE Take 1 tablet (10 mg total) by mouth daily. Start taking on: September 18, 2021   losartan 25 MG tablet Commonly known as: COZAAR Take 0.5 tablets (12.5 mg total) by mouth daily. Start taking on: September 18, 2021   nitroGLYCERIN 0.4 MG SL tablet Commonly known as: NITROSTAT Place 1 tablet (0.4 mg total) under the tongue every 5 (five) minutes as needed for chest pain.   OXYGEN Inhale 4 L into the lungs continuous. Uses 4L at bedtime and if needed throughout the day -- intolerant to CPAP   prazosin 1 MG capsule Commonly known as: MINIPRESS Take 1 mg by mouth at bedtime.   predniSONE 10 MG tablet Commonly known as: DELTASONE Take 3 tablets (30 mg total)  by mouth daily with breakfast for 2 days, THEN 2 tablets (20 mg total) daily with breakfast for 3 days, THEN 1 tablet (10 mg total) daily with breakfast for 3 days. Start taking on: September 18, 2021   QUEtiapine Fumarate 150 MG 24 hr tablet Commonly known as: SEROQUEL XR Take 150 mg by mouth at bedtime.   spironolactone 25 MG tablet Commonly known as: ALDACTONE Take 1 tablet (25 mg total) by mouth daily. Start taking on: September 18, 2021   Tiotropium Bromide Monohydrate 2.5 MCG/ACT Aers Inhale 2 puffs into the lungs in the morning.   torsemide 20 MG tablet Commonly known as: DEMADEX Take 0.5 tablets (10 mg total) by mouth daily. Start taking on: September 18, 2021   traZODone 100 MG tablet Commonly known as: DESYREL Take 100 mg by mouth at bedtime.   Wixela Inhub 250-50 MCG/ACT Aepb Generic drug: fluticasone-salmeterol Inhale 1 puff into the lungs 2 (two) times daily.               Durable Medical Equipment  (From admission, onward)           Start     Ordered   09/17/21 1344  For home use only DME Eelevated commode seat  Once       Comments: 3 in 1   09/17/21 1343            Disposition  The patient will be discharged in stable condition to home.   Follow-up Information     Ronnell Freshwater, NP. Go on 09/19/2021.   Specialty: Family Medicine Why: @1 :15pm Contact information: [place] [address] [phone]         [facility] Follow up.   Specialty: Cardiology Why: 3/23 at 10:00 am [facility] at [facility] [place] [facility] (Dr. Claris Gladden office). Contact information: [address] [id number] [place]        Magdalen Spatz, NP Follow up.   Specialty: Pulmonary Disease Why: 10/09/21 at 9:30 AM Dr. Juanetta Gosling office (pulmonary follow-up) Contact information: [place] [address] [place]  [address] [phone]                   Duration of Discharge Encounter: Greater than 35 minutes   Signed, Lyda Jester, PA-C  09/17/2021, 4:26 PM

## 2021-09-17 NOTE — Progress Notes (Signed)
Occupational Therapy Treatment ?Patient Details ?Name: John Ball ?MRN: [id number] ?DOB: 1941-04-02 ?Today's Date: 09/17/2021 ? ? ?History of present illness 81 yo admitted 3/8 with volume overload, CHF, with new Afib s/p heart cath 3/8. PMHx:CAD s/p CABG, Tobacco use, severe COPD, h/o NSCLC s/p lobectomy, OSA on CPAP, PVD, CKD, HTN, HLD, LE DVT, and PTSD ?  ?OT comments ? Patient continues to make steady progress towards goals in skilled OT session. Upon therapist's entry, patient attempting to remove primofit with increased effort as wife assisted with lower body dressing. OT assisting in removal of primofit (with RN following OT in shortly after instructing patient not to remove IV or heart leads without IV team). Educated patient and family on energy conservation and lower body techniques with wife providing min A to patient in standing to don underwear and pants. Wife and patient both able to verbalize strategies for energy conservation and understanding need for patient to wear oxygen 24/7. Discharge remains appropriate, therapy will continue to follow acutely.   ? ?Recommendations for follow up therapy are one component of a multi-disciplinary discharge planning process, led by the attending physician.  Recommendations may be updated based on patient status, additional functional criteria and insurance authorization. ?   ?Follow Up Recommendations ? Home health OT  ?  ?Assistance Recommended at Discharge Intermittent Supervision/Assistance  ?Patient can return home with the following ? A little help with walking and/or transfers;A little help with bathing/dressing/bathroom;Assistance with cooking/housework;Assist for transportation;Help with stairs or ramp for entrance ?  ?Equipment Recommendations ? BSC/3in1  ?  ?Recommendations for Other Services   ? ?  ?Precautions / Restrictions Precautions ?Precautions: Fall;Other (comment) ?Precaution Comments: watch sats. LUE post heart cath 3/8 ?Restrictions ?Weight  Bearing Restrictions: No  ? ? ?  ? ?Mobility Bed Mobility ?  ?  ?  ?  ?  ?  ?  ?General bed mobility comments: Up in chair upon arrival ?  ? ?Transfers ?Overall transfer level: Needs assistance ?Equipment used: 1 person hand held assist ?Transfers: Sit to/from Stand ?Sit to Stand: Min guard ?  ?  ?  ?  ?  ?General transfer comment: bracing on table or using therapist's hand in order to maintain balance ?  ?  ?Balance Overall balance assessment: Needs assistance ?Sitting-balance support: Feet supported ?Sitting balance-Leahy Scale: Fair ?  ?  ?Standing balance support: Single extremity supported ?Standing balance-Leahy Scale: Fair ?Standing balance comment: single UE support for limited standing ?  ?  ?  ?  ?  ?  ?  ?  ?  ?  ?  ?   ? ?ADL either performed or assessed with clinical judgement  ? ?ADL Overall ADL's : Needs assistance/impaired ?  ?  ?  ?  ?  ?  ?  ?  ?  ?  ?Lower Body Dressing: Sitting/lateral leans;Minimal assistance ?Lower Body Dressing Details (indicate cue type and reason): wife providing min A in order to complete in standing, more than likely could complete independently from sitting/lateral leans ?  ?  ?  ?  ?  ?  ?Functional mobility during ADLs: Minimal assistance;Rolling walker (2 wheels) ?General ADL Comments: Session focus on lower body dressing and family education for safe discharge home ?  ? ?Extremity/Trunk Assessment   ?  ?  ?  ?  ?  ? ?Vision   ?  ?  ?Perception   ?  ?Praxis   ?  ? ?Cognition Arousal/Alertness: Awake/alert ?Behavior During Therapy: Restless, Impulsive ?Overall  Cognitive Status: Within Functional Limits for tasks assessed ?  ?  ?  ?  ?  ?  ?  ?  ?  ?  ?  ?  ?  ?  ?  ?  ?General Comments: Extremely hard of hearing, entered room with patient attempting to remove primofit by himself ?  ?  ?   ?Exercises   ? ?  ?Shoulder Instructions   ? ? ?  ?General Comments    ? ? ?Pertinent Vitals/ Pain       Pain Assessment ?Pain Assessment: No/denies pain ? ?Home Living   ?  ?  ?  ?  ?   ?  ?  ?  ?  ?  ?  ?  ?  ?  ?  ?  ?  ?  ? ?  ?Prior Functioning/Environment    ?  ?  ?  ?   ? ?Frequency ? Min 2X/week  ? ? ? ? ?  ?Progress Toward Goals ? ?OT Goals(current goals can now be found in the care plan section) ? Progress towards OT goals: Progressing toward goals ? ?Acute Rehab OT Goals ?Patient Stated Goal: to get home ?OT Goal Formulation: With patient ?Time For Goal Achievement: 09/28/21 ?Potential to Achieve Goals: Fair  ?Plan Discharge plan remains appropriate   ? ?Co-evaluation ? ? ?   ?  ?  ?  ?  ? ?  ?AM-PAC OT "6 Clicks" Daily Activity     ?Outcome Measure ? ? Help from another person eating meals?: A Little ?Help from another person taking care of personal grooming?: A Little ?Help from another person toileting, which includes using toliet, bedpan, or urinal?: A Little ?Help from another person bathing (including washing, rinsing, drying)?: A Little ?Help from another person to put on and taking off regular upper body clothing?: A Little ?Help from another person to put on and taking off regular lower body clothing?: A Little ?6 Click Score: 18 ? ?  ?End of Session Equipment Utilized During Treatment: Oxygen ? ?OT Visit Diagnosis: Unsteadiness on feet (R26.81);Other abnormalities of gait and mobility (R26.89);Muscle weakness (generalized) (M62.81) ?  ?Activity Tolerance Patient tolerated treatment well ?  ?Patient Left in chair;with call bell/phone within reach ?  ?Nurse Communication Mobility status ?  ? ?   ? ?Time: 5726-2035 ?OT Time Calculation (min): 12 min ? ?Charges: OT General Charges ?$OT Visit: 1 Visit ?OT Treatments ?$Self Care/Home Management : 8-22 mins ? ?[name], OTR/L ?Acute Rehabilitation Services ?[phone] ?[phone]  ? ?[name] ?09/17/2021, 12:32 PM ?

## 2021-09-17 NOTE — Progress Notes (Signed)
Patient went home with oxygen and wife picked him up. NT Otila Kluver escorted patient downstairs with volunteer transporter. ? ?Janett Billow stated that she completed transition of care on her end. Pharmacy brought meds up to the unit and gave them to the patient.  ? ?Pt went home on 4 liters of oxygen as ordered. Denies pain. ?

## 2021-09-18 ENCOUNTER — Ambulatory Visit: Payer: Medicare Other | Admitting: Student

## 2021-09-18 ENCOUNTER — Other Ambulatory Visit (HOSPITAL_COMMUNITY): Payer: Self-pay | Admitting: *Deleted

## 2021-09-18 ENCOUNTER — Other Ambulatory Visit (HOSPITAL_COMMUNITY): Payer: Self-pay

## 2021-09-18 MED ORDER — TORSEMIDE 20 MG PO TABS: 10.0000 mg | ORAL_TABLET | Freq: Every day | ORAL | 5 refills | Status: DC

## 2021-09-18 MED ORDER — LOSARTAN POTASSIUM 25 MG PO TABS: 12.5000 mg | ORAL_TABLET | Freq: Every day | ORAL | 5 refills | Status: DC

## 2021-09-18 MED ORDER — EMPAGLIFLOZIN 10 MG PO TABS: 10.0000 mg | ORAL_TABLET | Freq: Every day | ORAL | 5 refills | Status: DC

## 2021-09-18 MED ORDER — APIXABAN 5 MG PO TABS: 5.0000 mg | ORAL_TABLET | Freq: Two times a day (BID) | ORAL | 5 refills | Status: DC

## 2021-09-18 MED ORDER — SPIRONOLACTONE 25 MG PO TABS: 25.0000 mg | ORAL_TABLET | Freq: Every day | ORAL | 5 refills | Status: DC

## 2021-09-18 MED ORDER — AMIODARONE HCL 200 MG PO TABS: 200.0000 mg | ORAL_TABLET | Freq: Two times a day (BID) | ORAL | 1 refills | Status: DC

## 2021-09-18 MED ORDER — APIXABAN 5 MG PO TABS: 5.0000 mg | ORAL_TABLET | Freq: Two times a day (BID) | ORAL | 5 refills | Status: AC

## 2021-09-18 NOTE — Patient Outreach (Signed)
[facility]) Care Management ? ?09/18/2021 ? ?John Ball ?06-30-1941 ?[id number] ? ? ?Received hospital referral from Natividad Brood, RN for Care Coordinator for [facility] calls and assess for further needs. Assigned patient to Joellyn Quails, RN Care Coordinator for follow up. ? ?John Ball ?[place] Management Assistant ?[phone] ? ?

## 2021-09-19 ENCOUNTER — Other Ambulatory Visit: Payer: Self-pay

## 2021-09-19 ENCOUNTER — Ambulatory Visit (INDEPENDENT_AMBULATORY_CARE_PROVIDER_SITE_OTHER): Payer: Medicare Other | Admitting: Nurse Practitioner

## 2021-09-19 ENCOUNTER — Encounter: Payer: Self-pay | Admitting: Nurse Practitioner

## 2021-09-19 VITALS — BP 124/79 | HR 89 | Temp 97.5°F | Ht 72.0 in | Wt 153.1 lb

## 2021-09-19 DIAGNOSIS — Z09 Encounter for follow-up examination after completed treatment for conditions other than malignant neoplasm: Secondary | ICD-10-CM

## 2021-09-19 DIAGNOSIS — I509 Heart failure, unspecified: Secondary | ICD-10-CM | POA: Diagnosis not present

## 2021-09-19 DIAGNOSIS — I10 Essential (primary) hypertension: Secondary | ICD-10-CM

## 2021-09-19 DIAGNOSIS — R0602 Shortness of breath: Secondary | ICD-10-CM

## 2021-09-19 DIAGNOSIS — J449 Chronic obstructive pulmonary disease, unspecified: Secondary | ICD-10-CM

## 2021-09-19 NOTE — Progress Notes (Signed)
Established patient visit ? ? ?Patient: John Ball   DOB: [birth year]   81 y.o. Male  MRN: [id number] ?Visit Date: 09/19/2021 ? ? ?Chief Complaint  ?Patient presents with  ? Hospitalization Follow-up  ? ?Subjective  ?  ?HPI  ?The patient is here for hospital follow up. Was hospitalized from 09/12/2021 through 09/18/2021 due to congestive heart failure. He was taken to the hospital on Wednesday for heart catheterization. Has blockage they were hoping to stent. Were unable to stent the blockage. He was kept in hospital until Monday.  The patient was started on several new medications. Will see heart doctor next week. Will see lung doctor 10/16/2021.  ?-patient very fatigued. Using urinal at home to urinate as he is too fatigued to get up to use the bathroom  ?-patient short of breath.  ?Discharge disposition was as follows: ? ?Eliquis 5 mg bid, amiodarone 200 mg bid x 10 days then 200 mg daily, torsemide 10 mg daily, spironolactone 25 mg daily, Farxiga 10 mg daily, digoxin 0.125 daily, losartan 12.5 daily, prednisone taper per pulmonary.  ? ?The patient's wife states that she just got these medications filled today per the [facility] outpatient pharmacy.  ? ?Medications: ?Outpatient Medications Prior to Visit  ?Medication Sig  ? acetaminophen (TYLENOL) 500 MG tablet Take 1,000 mg by mouth every 6 (six) hours as needed for moderate pain or headache.   ? albuterol (PROVENTIL HFA;VENTOLIN HFA) 108 (90 BASE) MCG/ACT inhaler Inhale 1 puff into the lungs every 6 (six) hours as needed for wheezing or shortness of breath.  ? albuterol (PROVENTIL) (2.5 MG/3ML) 0.083% nebulizer solution Take 2.5 mg by nebulization every 6 (six) hours as needed for wheezing or shortness of breath.  ? apixaban (ELIQUIS) 5 MG TABS tablet Take 1 tablet (5 mg total) by mouth 2 (two) times daily.  ? cholecalciferol (VITAMIN D) 25 MCG (1000 UNIT) tablet Take 1,000 Units by mouth in the morning.  ? CYMBALTA 60 MG capsule Take 60 mg by mouth in the morning.  ?  digoxin (LANOXIN) 0.125 MG tablet Take 1 tablet (0.125 mg total) by mouth daily.  ? empagliflozin (JARDIANCE) 10 MG TABS tablet Take 1 tablet (10 mg total) by mouth daily.  ? losartan (COZAAR) 25 MG tablet Take 0.5 tablets (12.5 mg total) by mouth daily.  ? nitroGLYCERIN (NITROSTAT) 0.4 MG SL tablet Place 1 tablet (0.4 mg total) under the tongue every 5 (five) minutes as needed for chest pain.  ? OXYGEN Inhale 4 L into the lungs continuous. Uses 4L at bedtime and if needed throughout the day -- intolerant to CPAP  ? prazosin (MINIPRESS) 1 MG capsule Take 1 mg by mouth at bedtime.  ? [EXPIRED] predniSONE (DELTASONE) 10 MG tablet Take 3 tablets (30 mg total) by mouth daily with breakfast for 2 days, THEN 2 tablets (20 mg total) daily with breakfast for 3 days, THEN 1 tablet (10 mg total) daily with breakfast for 3 days.  ? QUEtiapine Fumarate (SEROQUEL XR) 150 MG 24 hr tablet Take 150 mg by mouth at bedtime.  ? spironolactone (ALDACTONE) 25 MG tablet Take 1 tablet (25 mg total) by mouth daily.  ? Tiotropium Bromide Monohydrate 2.5 MCG/ACT AERS Inhale 2 puffs into the lungs in the morning.  ? torsemide (DEMADEX) 20 MG tablet Take 0.5 tablets (10 mg total) by mouth daily.  ? traZODone (DESYREL) 100 MG tablet Take 100 mg by mouth at bedtime.  ? WIXELA INHUB 250-50 MCG/ACT AEPB Inhale 1 puff into the lungs  2 (two) times daily.  ? [DISCONTINUED] amiodarone (PACERONE) 200 MG tablet Take 1 tablet twice a day for 10 days, then reduce to 1 tablet once daily  ? [DISCONTINUED] apixaban (ELIQUIS) 5 MG TABS tablet Take 1 tablet (5 mg total) by mouth 2 (two) times daily.  ? [DISCONTINUED] empagliflozin (JARDIANCE) 10 MG TABS tablet Take 1 tablet (10 mg total) by mouth daily.  ? ?Facility-Administered Medications Prior to Visit  ?Medication Dose Route Frequency Provider  ? sodium chloride flush (NS) 0.9 % injection 3 mL  3 mL Intravenous Q12H Warren Lacy, PA-C  ? ? ?Review of Systems  ?Constitutional:  Positive for activity  change and fatigue. Negative for chills and fever.  ?HENT:  Negative for congestion, postnasal drip, rhinorrhea, sinus pressure, sinus pain, sneezing and sore throat.   ?Eyes: Negative.   ?Respiratory:  Positive for cough, shortness of breath and wheezing.   ?Cardiovascular:  Positive for leg swelling. Negative for chest pain and palpitations.  ?Gastrointestinal:  Negative for constipation, diarrhea, nausea and vomiting.  ?Endocrine: Negative for cold intolerance, heat intolerance, polydipsia and polyuria.  ?     Elevated blood sugar  ?Genitourinary:  Negative for dysuria, frequency and urgency.  ?Musculoskeletal:  Negative for back pain and myalgias.  ?Skin:  Negative for rash.  ?Allergic/Immunologic: Negative for environmental allergies.  ?Neurological:  Negative for dizziness, weakness and headaches.  ?Psychiatric/Behavioral:  The patient is not nervous/anxious.   ? ?Last CBC ?Lab Results  ?Component Value Date  ? WBC 6.6 09/27/2021  ? HGB 13.8 09/27/2021  ? HCT 42.8 09/27/2021  ? MCV 109.7 (H) 09/27/2021  ? MCH 35.4 (H) 09/27/2021  ? RDW 14.2 09/27/2021  ? PLT 154 09/27/2021  ? ?Last metabolic panel ?Lab Results  ?Component Value Date  ? GLUCOSE 108 (H) 09/27/2021  ? NA 138 09/27/2021  ? K 4.9 09/27/2021  ? CL 98 09/27/2021  ? CO2 33 (H) 09/27/2021  ? BUN 42 (H) 09/27/2021  ? CREATININE 1.63 (H) 09/27/2021  ? GFRNONAA 43 (L) 09/27/2021  ? CALCIUM 9.6 09/27/2021  ? PROT 6.1 (L) 09/12/2021  ? ALBUMIN 3.7 09/12/2021  ? LABGLOB 2.1 08/20/2021  ? AGRATIO 2.1 08/20/2021  ? BILITOT 0.5 09/12/2021  ? ALKPHOS 45 09/12/2021  ? AST 18 09/12/2021  ? ALT 14 09/12/2021  ? ANIONGAP 7 09/27/2021  ? ?Last lipids ?Lab Results  ?Component Value Date  ? CHOL 190 08/20/2021  ? HDL 53 08/20/2021  ? [place] 118 08/20/2021  ? TRIG 107 08/20/2021  ? CHOLHDL 3.6 08/20/2021  ? ?Last hemoglobin A1c ?Lab Results  ?Component Value Date  ? HGBA1C 6.0 (H) 08/20/2021  ? ?Last thyroid functions ?Lab Results  ?Component Value Date  ? TSH 3.210  08/20/2021  ? T3TOTAL 91 03/24/2019  ? ?Last vitamin B12 and Folate ?Lab Results  ?Component Value Date  ? [id number] 690 09/25/2017  ? ?  ? ? Objective  ?  ? ?Today's Vitals  ? 09/19/21 1329  ?BP: 124/79  ?Pulse: 89  ?Temp: (!) 97.5 ?F (36.4 ?C)  ?SpO2: 94%  ?Weight: 153 lb 1.9 oz (69.5 kg)  ?Height: 6' (1.829 m)  ? ?Body mass index is 20.77 kg/m?.  ? ?BP Readings from Last 3 Encounters:  ?09/27/21 120/60  ?09/19/21 124/79  ?09/17/21 111/60  ?  ?Wt Readings from Last 3 Encounters:  ?09/27/21 149 lb 12.8 oz (67.9 kg)  ?09/20/21 153 lb (69.4 kg)  ?09/19/21 153 lb 1.9 oz (69.5 kg)  ?  ?Physical Exam ?Vitals  and nursing note reviewed.  ?Constitutional:   ?   Appearance: Normal appearance. He is well-developed. He is ill-appearing.  ?HENT:  ?   Head: Normocephalic and atraumatic.  ?Eyes:  ?   Pupils: Pupils are equal, round, and reactive to light.  ?Cardiovascular:  ?   Rate and Rhythm: Normal rate. Rhythm irregular.  ?   Pulses: Normal pulses.  ?   Heart sounds: Murmur heard.  ?Pulmonary:  ?   Effort: Pulmonary effort is normal.  ?   Breath sounds: Wheezing present.  ?   Comments: Patient using accessory muscles to breath. Currently on oxygen 5 lpm per nasal cannula  ?Abdominal:  ?   Palpations: Abdomen is soft.  ?Musculoskeletal:     ?   General: Normal range of motion.  ?   Cervical back: Normal range of motion and neck supple.  ?Lymphadenopathy:  ?   Cervical: No cervical adenopathy.  ?Skin: ?   General: Skin is warm and dry.  ?   Capillary Refill: Capillary refill takes less than 2 seconds.  ?Neurological:  ?   General: No focal deficit present.  ?   Mental Status: He is alert and oriented to person, place, and time. Mental status is at baseline.  ?Psychiatric:     ?   Mood and Affect: Mood normal.     ?   Behavior: Behavior normal.     ?   Thought Content: Thought content normal.     ?   Judgment: Judgment normal.  ?  ? Assessment & Plan  ?  ?1. Hospital discharge follow-up ?Patient hospitalized from 09/12/2021  through 09/18/2021 due to acute on chronic heart failure. Reviewed progress notes, labs, and imaging studies done during his hospitalization  ? ?2. Acute on chronic congestive heart failure, unspecified heart

## 2021-09-20 ENCOUNTER — Encounter: Payer: Self-pay | Admitting: *Deleted

## 2021-09-20 ENCOUNTER — Other Ambulatory Visit: Payer: Self-pay | Admitting: *Deleted

## 2021-09-20 DIAGNOSIS — E785 Hyperlipidemia, unspecified: Secondary | ICD-10-CM | POA: Insufficient documentation

## 2021-09-20 DIAGNOSIS — I70213 Atherosclerosis of native arteries of extremities with intermittent claudication, bilateral legs: Secondary | ICD-10-CM | POA: Insufficient documentation

## 2021-09-20 DIAGNOSIS — F17200 Nicotine dependence, unspecified, uncomplicated: Secondary | ICD-10-CM | POA: Insufficient documentation

## 2021-09-20 DIAGNOSIS — K8080 Other cholelithiasis without obstruction: Secondary | ICD-10-CM | POA: Insufficient documentation

## 2021-09-20 DIAGNOSIS — F330 Major depressive disorder, recurrent, mild: Secondary | ICD-10-CM | POA: Insufficient documentation

## 2021-09-20 NOTE — Patient Outreach (Signed)
[facility]) Care Management Telephonic RN Care Manager Note   09/20/2021 Name:  John Ball MRN:  [id number] DOB:  09/12/1940  Summary: Post hospital follow up/transition of care outreach John Ball reports John Ball is doing well today  She reports she has bathe & dress him and is presently filling his medicine containers She and RN CM review the discharge instructions to clarify that Norvasc was discontinued Norvasc bottle was removed John Ball reviewed medications with pcp on 09/19/21 and will re review with cardiologist soon Medications all obtained from [facility] administration ([place]) on 09/19/21 after a wife [facility] on the prescription needs for the [place]  congestive Heart Failure (CHF) Weighing at home Now wt 153 lbs was 187 lbs at the heaviest  [place] appointment on 09/26/21  Cardiology and heart failure clinic appointment on 09/27/21  Pulmonology clinic appointment on 10/16/21   Recommendations/Changes made from today'Ball visit: Completion of transition of care and initial assessment Review discharged instructions with wife  Commended wife for being patient'Ball best advocate Reviewed [facility] program, transition of care outreaches and next follow up   Subjective: John Ball is an 81 y.o. year old male who is a primary patient of John Jews, NP. The care management team was consulted for assistance with care management and/or care coordination needs.    Telephonic RN Care Manager completed Telephone Visit today.   Objective:  Medications Reviewed Today     Reviewed by John Gallant, RN (Registered Nurse) on 09/20/21 at 1256  Med List Status: <None>   Medication Order Taking? Sig Documenting Provider Last Dose Status Informant  acetaminophen (TYLENOL) 500 MG tablet [id number]  Take 1,000 mg by mouth every 6 (six) hours as needed for moderate pain or headache.  [provider]   Active Spouse/Significant Other           Med Note John Ball, John Ball   Fri Jul 13, 2021 11:49 AM)    albuterol (PROVENTIL HFA;VENTOLIN HFA) 108 (90 BASE) MCG/ACT inhaler [id number]  Inhale 1 puff into the lungs every 6 (six) hours as needed for wheezing or shortness of breath. [provider]  Active Spouse/Significant Other  albuterol (PROVENTIL) (2.5 MG/3ML) 0.083% nebulizer solution [id number]  Take 2.5 mg by nebulization every 6 (six) hours as needed for wheezing or shortness of breath. [provider]  Active Spouse/Significant Other  amiodarone (PACERONE) 200 MG tablet [id number]  Take 1 tablet twice a day for 10 days, then reduce to 1 tablet once daily John Morale, MD  Active   apixaban (ELIQUIS) 5 MG TABS tablet [id number]  Take 1 tablet (5 mg total) by mouth 2 (two) times daily. John Morale, MD  Active   apixaban (ELIQUIS) 5 MG TABS tablet [id number]  Take 1 tablet (5 mg total) by mouth 2 (two) times daily. John Morale, MD  Active   cholecalciferol (VITAMIN D) 25 MCG (1000 UNIT) tablet [id number]  Take 1,000 Units by mouth in the morning. [provider]  Active Spouse/Significant Other  CYMBALTA 60 MG capsule [id number]  Take 60 mg by mouth in the morning. [provider]  Active Spouse/Significant Other  digoxin (LANOXIN) 0.125 MG tablet [id number]  Take 1 tablet (0.125 mg total) by mouth daily. John Morale, MD  Active   empagliflozin (JARDIANCE) 10 MG TABS tablet [id number]  Take 1 tablet (10 mg total) by mouth daily. John Morale, MD  Active  empagliflozin (JARDIANCE) 10 MG TABS tablet [id number]  Take 1 tablet (10 mg total) by mouth daily. John Morale, MD  Active   losartan (COZAAR) 25 MG tablet [id number]  Take 0.5 tablets (12.5 mg total) by mouth daily. John Morale, MD  Active   nitroGLYCERIN (NITROSTAT) 0.4 MG SL tablet [id number]  Place 1 tablet (0.4 mg total) under the tongue every 5 (five) minutes as needed for chest pain.  John Lex, MD  Active Spouse/Significant Other           Med Note John Ball   Thu Mar 01, 2021  9:14 AM)    OXYGEN [id number]  Inhale 4 L into the lungs continuous. Uses 4L at bedtime and if needed throughout the day -- intolerant to CPAP [provider]  Active Spouse/Significant Other  prazosin (MINIPRESS) 1 MG capsule [id number]  Take 1 mg by mouth at bedtime. [provider]  Active Spouse/Significant Other  predniSONE (DELTASONE) 10 MG tablet [id number]  Take 3 tablets (30 mg total) by mouth daily with breakfast for 2 days, THEN 2 tablets (20 mg total) daily with breakfast for 3 days, THEN 1 tablet (10 mg total) daily with breakfast for 3 days. John Ball  Active   QUEtiapine Fumarate (SEROQUEL XR) 150 MG 24 hr tablet [id number]  Take 150 mg by mouth at bedtime. [provider]  Active Spouse/Significant Other  sodium chloride flush (NS) 0.9 % injection 3 mL [id number]   John Ball  Active   spironolactone (ALDACTONE) 25 MG tablet [id number]  Take 1 tablet (25 mg total) by mouth daily. John Morale, MD  Active   Tiotropium Bromide Monohydrate 2.5 MCG/ACT AERS [id number]  Inhale 2 puffs into the lungs in the morning. [provider]  Active Spouse/Significant Other  torsemide (DEMADEX) 20 MG tablet [id number]  Take 0.5 tablets (10 mg total) by mouth daily. John Morale, MD  Active   traZODone (DESYREL) 100 MG tablet [id number]  Take 100 mg by mouth at bedtime. [provider]  Active Spouse/Significant Other  WIXELA INHUB 250-50 MCG/ACT AEPB [id number]  Inhale 1 puff into the lungs 2 (two) times daily. [provider]  Active Spouse/Significant Other  Med List Note John Ball, CPhT 07/13/21 1224): [facility] (if [place] MD prescribes)             SDOH:  (Social Determinants of Health) assessments and interventions performed:  SDOH Interventions    Flowsheet Row Most Recent Value  SDOH  Interventions   Food Insecurity Interventions Intervention Not Indicated  Financial Strain Interventions Intervention Not Indicated  Housing Interventions Intervention Not Indicated  Intimate Partner Violence Interventions Intervention Not Indicated  Stress Interventions Intervention Not Indicated  Social Connections Interventions Intervention Not Indicated  Transportation Interventions Intervention Not Indicated       Care Plan  Review of patient past medical history, allergies, medications, health status, including review of consultants reports, laboratory and other test data, was performed as part of comprehensive evaluation for care management services.   Care Plan : RN Care Manager Plan of Care  Updates made by John Gallant, RN since 09/20/2021 12:00 AM     Problem: Complex Care Coordination Needs and disease management in patient with CHF   Priority: High  Onset Date: 09/20/2021     Long-Range Goal: Establish Plan of Care for Management Complex SDOH Barriers, disease management and Care Coordination Needs in patient with CHF   Start Date: 09/20/2021  This Visit'Ball Progress: On track  Priority: High  Note:   Current Barriers:  Knowledge Deficits related to plan of care for management of CHF  Care Coordination needs related to Limited education about CHF home management*  RN CM Clinical Goal(Ball):  Patient will demonstrate Improved adherence to prescribed treatment plan for CHF as evidenced by decrease re admission risk in next 30 days  through collaboration with RN Care manager, provider, and care team.   Interventions: Outreaches for care coordination, disease management, resources, home care/education needs with wife and patient Inter-disciplinary care team collaboration (see longitudinal plan of care) Evaluation of current treatment plan related to  self management and patient'Ball adherence to plan as established by provider   Heart Failure Interventions:  (Status:   New goal.) Long Term Goal Assessed need for readable accurate scales in home Discussed importance of daily weight and advised patient to weigh and record daily Reviewed role of diuretics in prevention of fluid overload and management of heart failure; Discussed the importance of keeping all appointments with provider Screening for signs and symptoms of depression related to chronic disease state  Assessed social determinant of health barriers  09/20/21 Review discharged instructions with wife  Commended wife for being patient'Ball best advocate Reviewed [facility] program, transition of care outreaches and next follow up  Patient Goals/Self-Care Activities: Take all medications as prescribed Attend all scheduled provider appointments Call provider office for new concerns or questions   Follow Up Plan:  The patient has been provided with contact information for the care management team and has been advised to call with any health related questions or concerns.  The care management team will reach out to the patient again over the next 15+ business  days.       Plan: The patient has been provided with contact information for the care management team and has been advised to call with any health related questions or concerns.  The care management team will reach out to the patient again over the next 15+ business  days.  Dejean Tribby L. Noelle Penner, RN, BSN, CCM [facility] Telephonic Care Management Care Coordinator Office number [phone] [facility] number [phone] Fax number [phone]

## 2021-09-24 ENCOUNTER — Telehealth (HOSPITAL_COMMUNITY): Payer: Self-pay

## 2021-09-24 ENCOUNTER — Other Ambulatory Visit (HOSPITAL_COMMUNITY): Payer: Self-pay

## 2021-09-24 NOTE — Telephone Encounter (Signed)
Pharmacy Transitions of Care Follow-up Telephone Call ? ?Date of discharge: 09/17/21  ?Discharge Diagnosis: Heart failure ? ?How have you been since you were released from the hospital? Patient had difficult time getting meds at discharge due to having [employer]. Instructed patient's wife that patient's heart doctor can send new Rxs to the [place] pharmacy to be filled. Patient's wife believes he is on too much medication and is having trouble sleeping and talking in his sleep since discharge. She will bring all his medications to Cardiology f/u at the end of this week. No questions about meds at this time.  ? ?Medication changes made at discharge: ?    START taking: ?digoxin (LANOXIN)  ?empagliflozin (JARDIANCE)  ?predniSONE (DELTASONE)  ?Start taking on: September 18, 2021 ?STOP taking: ?aspirin EC 81 MG tablet  ?furosemide 20 MG tablet (LASIX)  ?furosemide 40 MG tablet (LASIX)  ?ramipril 2.5 MG capsule (ALTACE)  ? ?Medication changes verified by the patient? Yes ?  ? ?Medication Accessibility: ? ?[facility]  ? ?Was the patient provided with refills on discharged medications? Yes  ? ?Have all prescriptions been transferred from [facility] to home pharmacy? No, unable to transfer to [place]  ? ?Is the patient able to afford medications? Has insurance ?  ? ?Medication Review: ?Patient's wife confirmed he received counseling on Eliquis before discharge. ?APIXABAN (ELIQUIS)  ?Apixaban 5 mg BID initiated on 09/17/21.  ?- Discussed importance of taking medication around the same time everyday  ?- Advised patient of medications to avoid (NSAIDs, ASA)  ?- Educated that Tylenol (acetaminophen) will be the preferred analgesic to prevent risk of bleeding  ?- Emphasized importance of monitoring for signs and symptoms of bleeding (abnormal bruising, prolonged bleeding, nose bleeds, bleeding from gums, discolored urine, black tarry stools)  ?- Advised patient to alert all providers of anticoagulation therapy prior to starting a new  medication or having a procedure  ? ?Follow-up Appointments: ? ?PCP Hospital f/u appt confirmed? Scheduled to see Dr. Junius Creamer on 12/20/21 @ 11:10PM.  ? ?[facility] f/u appt confirmed? Scheduled to see Cardiology on 09/27/21 @ 10AM.  ? ?If their condition worsens, is the pt aware to call PCP or go to the Emergency Dept.? Yes ? ?Final Patient Assessment: ?Patient has f/u scheduled and knows to get refills sent to the [place] ? ?

## 2021-09-25 ENCOUNTER — Telehealth (HOSPITAL_COMMUNITY): Payer: Self-pay | Admitting: *Deleted

## 2021-09-25 NOTE — Telephone Encounter (Signed)
This is not something our office handles, will need to come from PCP,  ? ?Eliezer Lofts will reach out to her to f/u, thanks ?

## 2021-09-25 NOTE — Telephone Encounter (Signed)
Pts wife called stating she will need an SL-2 form filled out along with office note and hospital discharge note so that she can get pt in a facility. She asked that this form be filled out and ready by his appt on 3/24. ? ?Routed to [employer] to see if this is a form we can fill out ?

## 2021-09-26 ENCOUNTER — Telehealth (HOSPITAL_COMMUNITY): Payer: Self-pay | Admitting: Licensed Clinical Social Worker

## 2021-09-26 NOTE — Telephone Encounter (Signed)
CSW consulted to speak with pt wife regarding process to get pt into SNF.  CSW called pt wife to discuss.  Pt was just in the hospital and recommended for home health services which pt denied- pt wife reports this is due to him being particular about things and not wanting people in his house. ? ?Pt wife is wanting pt to go into rehab for the 20 days that his Medicare would cover if pt found eligible so he can get back to normal function.  Currently pt wife reports that she is helping with with ADLs at home- she feels safe doing this at this time but thinks rehab would be best for him. ? ?CSW explained that required fl2 paperwork would need to be completed by community PCP.  Pt sees Leretha Pol in community but wife reports he has only seen her twice and that he actually has a PCP through the [place] who he is seeing tomorrow.  CSW encouraged [facility] to [facility] as he might be eligible for SNF through [place] benefit as well which would not have the same 20 day limit for SNF.   ? ?CSW offered to make referral to [facility] case manager (already has phone visit set for tomorrow with one) about getting someone involved to help them further with SNF placement but she declines at this time- will plan [facility] with [facility]. ? ?No further needs at this time will continue to follow and assist as needed ? ?Jorge Ny, LCSW ?Clinical Social Worker ?[facility] ?Desk#: [phone] ?Cell#: [phone] ? ?

## 2021-09-27 ENCOUNTER — Other Ambulatory Visit: Payer: Self-pay | Admitting: *Deleted

## 2021-09-27 ENCOUNTER — Ambulatory Visit (HOSPITAL_COMMUNITY)
Admission: RE | Admit: 2021-09-27 | Discharge: 2021-09-27 | Disposition: A | Discharge status: Home / Self Care | Payer: Medicare Other | Source: Ambulatory Visit | Attending: Adult Health | Admitting: Adult Health

## 2021-09-27 ENCOUNTER — Telehealth: Payer: Self-pay | Admitting: Nurse Practitioner

## 2021-09-27 ENCOUNTER — Encounter (HOSPITAL_COMMUNITY): Payer: Self-pay

## 2021-09-27 ENCOUNTER — Other Ambulatory Visit: Payer: Self-pay

## 2021-09-27 VITALS — BP 120/60 | Wt 149.8 lb

## 2021-09-27 DIAGNOSIS — I4891 Unspecified atrial fibrillation: Secondary | ICD-10-CM | POA: Insufficient documentation

## 2021-09-27 DIAGNOSIS — I2582 Chronic total occlusion of coronary artery: Secondary | ICD-10-CM | POA: Diagnosis not present

## 2021-09-27 DIAGNOSIS — I480 Paroxysmal atrial fibrillation: Secondary | ICD-10-CM | POA: Insufficient documentation

## 2021-09-27 DIAGNOSIS — E785 Hyperlipidemia, unspecified: Secondary | ICD-10-CM | POA: Insufficient documentation

## 2021-09-27 DIAGNOSIS — I5022 Chronic systolic (congestive) heart failure: Secondary | ICD-10-CM | POA: Diagnosis not present

## 2021-09-27 DIAGNOSIS — Z7901 Long term (current) use of anticoagulants: Secondary | ICD-10-CM | POA: Insufficient documentation

## 2021-09-27 DIAGNOSIS — G4733 Obstructive sleep apnea (adult) (pediatric): Secondary | ICD-10-CM | POA: Diagnosis not present

## 2021-09-27 DIAGNOSIS — I739 Peripheral vascular disease, unspecified: Secondary | ICD-10-CM | POA: Insufficient documentation

## 2021-09-27 DIAGNOSIS — J449 Chronic obstructive pulmonary disease, unspecified: Secondary | ICD-10-CM | POA: Diagnosis not present

## 2021-09-27 DIAGNOSIS — Z86718 Personal history of other venous thrombosis and embolism: Secondary | ICD-10-CM | POA: Insufficient documentation

## 2021-09-27 DIAGNOSIS — Z95820 Peripheral vascular angioplasty status with implants and grafts: Secondary | ICD-10-CM | POA: Diagnosis not present

## 2021-09-27 DIAGNOSIS — N1831 Chronic kidney disease, stage 3a: Secondary | ICD-10-CM | POA: Diagnosis not present

## 2021-09-27 DIAGNOSIS — Z7984 Long term (current) use of oral hypoglycemic drugs: Secondary | ICD-10-CM | POA: Diagnosis not present

## 2021-09-27 DIAGNOSIS — F4310 Post-traumatic stress disorder, unspecified: Secondary | ICD-10-CM | POA: Diagnosis not present

## 2021-09-27 DIAGNOSIS — I5020 Unspecified systolic (congestive) heart failure: Secondary | ICD-10-CM | POA: Diagnosis not present

## 2021-09-27 DIAGNOSIS — I25810 Atherosclerosis of coronary artery bypass graft(s) without angina pectoris: Secondary | ICD-10-CM | POA: Insufficient documentation

## 2021-09-27 DIAGNOSIS — Z79899 Other long term (current) drug therapy: Secondary | ICD-10-CM | POA: Diagnosis not present

## 2021-09-27 DIAGNOSIS — I2721 Secondary pulmonary arterial hypertension: Secondary | ICD-10-CM | POA: Insufficient documentation

## 2021-09-27 DIAGNOSIS — I482 Chronic atrial fibrillation, unspecified: Secondary | ICD-10-CM | POA: Insufficient documentation

## 2021-09-27 DIAGNOSIS — F17210 Nicotine dependence, cigarettes, uncomplicated: Secondary | ICD-10-CM | POA: Diagnosis not present

## 2021-09-27 DIAGNOSIS — Z720 Tobacco use: Secondary | ICD-10-CM

## 2021-09-27 DIAGNOSIS — I2510 Atherosclerotic heart disease of native coronary artery without angina pectoris: Secondary | ICD-10-CM

## 2021-09-27 DIAGNOSIS — D696 Thrombocytopenia, unspecified: Secondary | ICD-10-CM | POA: Insufficient documentation

## 2021-09-27 DIAGNOSIS — R0609 Other forms of dyspnea: Secondary | ICD-10-CM | POA: Diagnosis not present

## 2021-09-27 DIAGNOSIS — I130 Hypertensive heart and chronic kidney disease with heart failure and stage 1 through stage 4 chronic kidney disease, or unspecified chronic kidney disease: Secondary | ICD-10-CM | POA: Insufficient documentation

## 2021-09-27 DIAGNOSIS — I340 Nonrheumatic mitral (valve) insufficiency: Secondary | ICD-10-CM | POA: Insufficient documentation

## 2021-09-27 DIAGNOSIS — Z85118 Personal history of other malignant neoplasm of bronchus and lung: Secondary | ICD-10-CM | POA: Insufficient documentation

## 2021-09-27 DIAGNOSIS — Z9981 Dependence on supplemental oxygen: Secondary | ICD-10-CM | POA: Diagnosis not present

## 2021-09-27 LAB — BASIC METABOLIC PANEL
Anion gap: 7 (ref 5–15)
BUN: 42 mg/dL — ABNORMAL HIGH (ref 8–23)
CO2: 33 mmol/L — ABNORMAL HIGH (ref 22–32)
Calcium: 9.6 mg/dL (ref 8.9–10.3)
Chloride: 98 mmol/L (ref 98–111)
Creatinine, Ser: 1.63 mg/dL — ABNORMAL HIGH (ref 0.61–1.24)
GFR, Estimated: 43 mL/min — ABNORMAL LOW (ref >60)
Glucose, Bld: 108 mg/dL — ABNORMAL HIGH (ref 70–99)
Potassium: 4.9 mmol/L (ref 3.5–5.1)
Sodium: 138 mmol/L (ref 135–145)

## 2021-09-27 LAB — CBC
HCT: 42.8 % (ref 39.0–52.0)
Hemoglobin: 13.8 g/dL (ref 13.0–17.0)
MCH: 35.4 pg — ABNORMAL HIGH (ref 26.0–34.0)
MCHC: 32.2 g/dL (ref 30.0–36.0)
MCV: 109.7 fL — ABNORMAL HIGH (ref 80.0–100.0)
Platelets: 154 10*3/uL (ref 150–400)
RBC: 3.9 MIL/uL — ABNORMAL LOW (ref 4.22–5.81)
RDW: 14.2 % (ref 11.5–15.5)
WBC: 6.6 10*3/uL (ref 4.0–10.5)
nRBC: 0 % (ref 0.0–0.2)

## 2021-09-27 MED ORDER — AMIODARONE HCL 200 MG PO TABS: 200.0000 mg | ORAL_TABLET | Freq: Every day | ORAL | 3 refills | Status: DC
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
## 2021-09-27 NOTE — Telephone Encounter (Signed)
Patient's wife called and stated she needed and FL2 form filled out so John Ball can be placed at [facility] home. Please advise. [phone] ?

## 2021-09-27 NOTE — Telephone Encounter (Signed)
The nursing home needs to send [place] the FL-2 form. I just saw him last week, so I don't need appointment with him.

## 2021-09-27 NOTE — Addendum Note (Signed)
Encounter addended by: Conrad [place], NP on: 09/27/2021 1:15 PM ? Actions taken: Clinical Note Signed

## 2021-09-27 NOTE — Patient Instructions (Addendum)
Thank you for coming in today ? ?Labs were done today, if any labs are abnormal the clinic will call you ?No news is good news ? ?You have been referred to Hospice care and their office will contact you for further details  ? ?DECREASE Amiodarone 200 mg 1 tablet daily  ? ?EKG was done today ? ?Your physician recommends that you schedule a follow-up appointment in:  ?6 weeks in clinic ? ?At the [facility], you and your health needs are our priority. As part of our continuing mission to provide you with exceptional heart care, we have created designated Provider Care Teams. These Care Teams include your primary Cardiologist (physician) and Advanced Practice Providers (APPs- Physician Assistants and Nurse Practitioners) who all work together to provide you with the care you need, when you need it.  ? ?You may see any of the following providers on your designated Care Team at your next follow up: ?Dr Glori Bickers ?Dr Loralie Champagne ?Darrick Grinder, NP ?Lyda Jester, PA ?Jessica Milford,NP ?Marlyce Huge, PA ?Audry Riles, PharmD ? ? ?Please be sure to bring in all your medications bottles to every appointment.  ? ?If you have any questions or concerns before your next appointment please send [place] a message through [place] or call our office at [phone].   ? ?TO LEAVE A MESSAGE FOR THE NURSE SELECT OPTION 2, PLEASE LEAVE A MESSAGE INCLUDING: ?YOUR NAME ?DATE OF BIRTH ?CALL BACK NUMBER ?REASON FOR CALL**this is important as we prioritize the call backs ? ?YOU WILL RECEIVE A CALL BACK THE SAME DAY AS LONG AS YOU CALL BEFORE 4:00 PM ? ?

## 2021-09-27 NOTE — Telephone Encounter (Signed)
lmtc
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
## 2021-10-01 ENCOUNTER — Telehealth (HOSPITAL_COMMUNITY): Payer: Self-pay | Admitting: *Deleted

## 2021-10-01 NOTE — Telephone Encounter (Signed)
Called pt LVM stating the paper work is ready for pick up ?

## 2021-10-01 NOTE — Telephone Encounter (Signed)
Pts wife called stating pt was c/o more shortness of breath. Pt said this is how he feels when his fluid is up. Wife said pt doesn't have any swelling and his weight is stable. Pt takes 1/2 tablet (10mg )of torsemide daily. Per Dr.McLean ok to give a whole tablet ( 20mg  )of torsemide today. Wife said she thinks its just anxiety.  ?

## 2021-10-02 ENCOUNTER — Telehealth: Payer: Self-pay | Admitting: Pulmonary Disease

## 2021-10-02 DIAGNOSIS — J9611 Chronic respiratory failure with hypoxia: Secondary | ICD-10-CM

## 2021-10-02 NOTE — Telephone Encounter (Signed)
Patients wife states he has a concentrator that only goes to 5 and he needs 6 or 7. Wife was told that we need to call [employer] to request a bigger machine.  ? ?Please advise ?

## 2021-10-02 NOTE — Telephone Encounter (Signed)
Order placed to [employer] for 10L concentrator.  ?Patient's spouse, Rebecca(DPR) is aware and voiced her understanding.  ?Nothing further needed.  ? ? ?

## 2021-10-02 NOTE — Telephone Encounter (Signed)
Lm for patient's spouse, Rebecca(DPR) ?

## 2021-10-02 NOTE — Telephone Encounter (Signed)
Spoke to patient's spouse, Rebecca(DPR). ?She is requesting an order for larger concentrator. His current concentrator only goes to 5L and patient was discharged on 6L. ? ?Dr. Halford Chessman, please advise. Thanks ?

## 2021-10-02 NOTE — Telephone Encounter (Signed)
Okay to send order for larger oxygen concentrator. ?

## 2021-10-02 NOTE — Telephone Encounter (Signed)
Patient's wife called back returning call. [phone] ?

## 2021-10-04 ENCOUNTER — Encounter: Payer: Self-pay | Admitting: *Deleted

## 2021-10-04 ENCOUNTER — Other Ambulatory Visit: Payer: Self-pay | Admitting: *Deleted

## 2021-10-04 ENCOUNTER — Telehealth: Payer: Self-pay | Admitting: Pulmonary Disease

## 2021-10-04 NOTE — Patient Outreach (Signed)
[facility]) Care Management ?Telephonic RN Care Manager Note ? ? ?10/04/2021 ?Name:  John Ball MRN:  [id number] DOB:  [birth year] ? ?Summary: ?Successful outreach for further transition of care assessment ?John Ball reports John Ball is doing well ?She confirms she did visit [facility] related to respite services (pending) and did receive outreaches & visit from palliative & hospice. [place] [facility] ([facility]) SW continues to be involve with possible offer of services for home or respite prn. ? ?She confirms she is still John Ball primary care giver and still has the options of respite care but has agreed with him to continue to provide care as long as she can. She continues to voice her concern with her fear of John Ball being verbally aggressive towards any visiting home health care staff or caregiver.  She confirms his voiced preference not to have others in the home except her. ?Palliative care  ?The [facility] ([facility]) ordered a palliative care consult on 09/26/21 ?Palliative care informed John Ball that John Ball is a hospice candidate. Hospice staff visited them on Sunday September 30 2021. Hospice visits are available for home visits but pt is not meeting for hospice facility placement at this time.  Pt does not prefer home visits. Palliative care staff to return a call to wife ? 10/10/21 ? ?He has developed two stage two pressure ulcers at coccycx and at 11 oclock of intergluteal cleft . One approx quarter size and intergluteal cleftsacral noted to be approx 2 inches in diamenter. ?The [facility] (VA) ordered wound care consult 09/26/21. John Ball completes wound care at home  ? ?[facility] provides John Ball's oxygen durable medical equipment (DME) needs ?John John Ball reports the patient is noted to be needing more than 5 L of oxygen at home. She reports he is getting short of breath with minimal exertion. Pt continues to smoke. ~ 1/2 ppd  States she recalls he was provided 6 L at the hospital. She states she will outreach to [facility] about this ?Confirm he has a follow up appointment pending with pulmonologist  ? ?Behavior/post traumatic stress disorder (PTSD) ?John Ball reports John Ball has refused counsel services and classes for PTSD via Veteran's administration ([place]) ?He is seen by a [facility] psychiatrist who manages his Seroquel, cymbalta ?She confirms John Ball would not benefit from a telephonic counselor option as he is very Hard of hearing [facility]) ?John Ball has a follow up visit at the [place] on 10/26/21  ?  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 ?John Ball denying needs at this time for care coordination ? ?Recommendations/Changes made from today's visit: ?Assessed for worsening symptoms, care coordination needs, update on level of care options ?Allowed John Sayas to ventilate  ?Discussed telephonic and virtual appointment availability if office visits become tasking ?RN CM assessed for care giver safety and coping. Encouraged caregiver/wife to outreach for assist as needed  ?Discussed [facility] progression and follow up outreach  ? ?Subjective: ?John Ball is an 81 y.o. year old male who is a primary patient of Boscia, Greer Ee, NP. The care management team was consulted for assistance with care management and/or care coordination needs.   ? ?Telephonic RN Care Manager completed Telephone Visit today.  ? ?Objective: ? ?Medications Reviewed Today   ? ? Reviewed by Barbaraann Faster, RN (Registered Nurse) on 09/20/21 at 1256  Med List Status: <None>  ? ?Medication Order Taking? Sig Documenting  Provider Last Dose Status Informant  ?acetaminophen (TYLENOL) 500 MG tablet [id number]  Take 1,000 mg by mouth every 6 (six) hours as needed for moderate pain or headache.  [provider]  Active Spouse/Significant Other  ?         ?Med Note Nash Mantis, [name]   Fri Jul 13, 2021  11:49 AM)    ?albuterol (PROVENTIL HFA;VENTOLIN HFA) 108 (90 BASE) MCG/ACT inhaler [id number]  Inhale 1 puff into the lungs every 6 (six) hours as needed for wheezing or shortness of breath. [provider]  Active Spouse/Significant Other  ?albuterol (PROVENTIL) (2.5 MG/3ML) 0.083% nebulizer solution [id number]  Take 2.5 mg by nebulization every 6 (six) hours as needed for wheezing or shortness of breath. [provider]  Active Spouse/Significant Other  ?amiodarone (PACERONE) 200 MG tablet [id number]  Take 1 tablet twice a day for 10 days, then reduce to 1 tablet once daily Larey Dresser, MD  Active   ?apixaban (ELIQUIS) 5 MG TABS tablet [id number]  Take 1 tablet (5 mg total) by mouth 2 (two) times daily. Larey Dresser, MD  Active   ?apixaban (ELIQUIS) 5 MG TABS tablet [id number]  Take 1 tablet (5 mg total) by mouth 2 (two) times daily. Larey Dresser, MD  Active   ?cholecalciferol (VITAMIN D) 25 MCG (1000 UNIT) tablet [id number]  Take 1,000 Units by mouth in the morning. [provider]  Active Spouse/Significant Other  ?CYMBALTA 60 MG capsule [id number]  Take 60 mg by mouth in the morning. [provider]  Active Spouse/Significant Other  ?digoxin (LANOXIN) 0.125 MG tablet [id number]  Take 1 tablet (0.125 mg total) by mouth daily. Larey Dresser, MD  Active   ?empagliflozin (JARDIANCE) 10 MG TABS tablet [id number]  Take 1 tablet (10 mg total) by mouth daily. Larey Dresser, MD  Active   ?empagliflozin (JARDIANCE) 10 MG TABS tablet [id number]  Take 1 tablet (10 mg total) by mouth daily. Larey Dresser, MD  Active   ?losartan (COZAAR) 25 MG tablet [id number]  Take 0.5 tablets (12.5 mg total) by mouth daily. Larey Dresser, MD  Active   ?nitroGLYCERIN (NITROSTAT) 0.4 MG SL tablet [id number]  Place 1 tablet (0.4 mg total) under the tongue every 5 (five) minutes as needed for chest pain. Leonie Man, MD  Active Spouse/Significant Other  ?         ?Med Note Lia Hopping, MINDY L    Thu Mar 01, 2021  9:14 AM)    ?OXYGEN [id number]  Inhale 4 L into the lungs continuous. Uses 4L at bedtime and if needed throughout the day -- intolerant to CPAP [provider]  Active Spouse/Significant Other  ?prazosin (MINIPRESS) 1 MG capsule [id number]  Take 1 mg by mouth at bedtime. [provider]  Active Spouse/Significant Other  ?predniSONE (DELTASONE) 10 MG tablet [id number]  Take 3 tablets (30 mg total) by mouth daily with breakfast for 2 days, THEN 2 tablets (20 mg total) daily with breakfast for 3 days, THEN 1 tablet (10 mg total) daily with breakfast for 3 days. Consuelo Pandy, PA-C  Active   ?QUEtiapine Fumarate (SEROQUEL XR) 150 MG 24 hr tablet [id number]  Take 150 mg by mouth at bedtime. [provider]  Active Spouse/Significant Other  ?sodium chloride flush (NS) 0.9 % injection 3 mL [id number]   Warren Lacy, PA-C  Active   ?spironolactone (ALDACTONE) 25 MG tablet [id number]  Take 1 tablet (25 mg total) by mouth daily. Aundra Dubin,  Elby Showers, MD  Active   ?Tiotropium Bromide Monohydrate 2.5 MCG/ACT AERS [id number]  Inhale 2 puffs into the lungs in the morning. [provider]  Active Spouse/Significant Other  ?torsemide (DEMADEX) 20 MG tablet [id number]  Take 0.5 tablets (10 mg total) by mouth daily. Larey Dresser, MD  Active   ?traZODone (DESYREL) 100 MG tablet [id number]  Take 100 mg by mouth at bedtime. [provider]  Active Spouse/Significant Other  ?WIXELA INHUB 250-50 MCG/ACT AEPB [id number]  Inhale 1 puff into the lungs 2 (two) times daily. [provider]  Active Spouse/Significant Other  ?Med List Note Tery Sanfilippo, CPhT 07/13/21 1224): [facility] Jule Ser (if [place] MD prescribes)  ? ?  ?  ? ?  ? ? ? ?SDOH:  (Social Determinants of Health) assessments and interventions performed:  ?SDOH Interventions   ? ?Flowsheet Row Most Recent Value  ?SDOH Interventions   ?Intimate Partner Violence Interventions Other (Comment)  [PMH PTSD bipolar HOH  seen by  [facility] psychiatrist Wife states pt can be verbally aggressive]  ?Physical Activity Interventions Other (Comments)  [PTSD bipolar pt with [place] candidate]  ? ?  ? ? ?Care Plan ?

## 2021-10-04 NOTE — Telephone Encounter (Signed)
Called and spoke with patient's wife John Ball. She stated that she had a convo recently with the patient and he had mentioned that he had a convo with Dr. Halford Chessman about his "end of life" care. I asked her if she was asking about a Hospice referral but she stated no. He had an Hospice evaluation on 09/30/2021 and was told that he did not qualify unless he had 10 days or less to live. Per John Ball, the patient stated "Dr. Halford Chessman told me he would prescribe medication to take me out". I advised John Ball that is illegal in [place], she stated that she was aware that is was illegal but the patient insisted that this convo took place.  ? ?Dr. Halford Chessman, please advise? I did try to speak with the patient but she stated that the patient did not want to talk anyone.  ?

## 2021-10-04 NOTE — Telephone Encounter (Signed)
I spoke to his wife.  Reviewed her concerns.  Explained that he really is at the point of hospice care, but not ready for residential hospice at this point.  Explained that there are also resources to help her while caring for Mr. Hennes.  She will contact the hospice team again and see about getting home hospice set up. ?

## 2021-10-09 ENCOUNTER — Inpatient Hospital Stay: Payer: Medicare Other | Admitting: Acute Care

## 2021-10-16 ENCOUNTER — Encounter: Payer: Self-pay | Admitting: Acute Care

## 2021-10-16 ENCOUNTER — Ambulatory Visit (INDEPENDENT_AMBULATORY_CARE_PROVIDER_SITE_OTHER): Payer: Medicare Other | Admitting: Acute Care

## 2021-10-16 VITALS — BP 112/68 | HR 74 | Temp 98.4°F | Ht 72.0 in | Wt 150.0 lb

## 2021-10-16 DIAGNOSIS — I779 Disorder of arteries and arterioles, unspecified: Secondary | ICD-10-CM | POA: Diagnosis not present

## 2021-10-16 DIAGNOSIS — I509 Heart failure, unspecified: Secondary | ICD-10-CM | POA: Diagnosis not present

## 2021-10-16 DIAGNOSIS — F17210 Nicotine dependence, cigarettes, uncomplicated: Secondary | ICD-10-CM | POA: Diagnosis not present

## 2021-10-16 DIAGNOSIS — Z9229 Personal history of other drug therapy: Secondary | ICD-10-CM | POA: Diagnosis not present

## 2021-10-16 DIAGNOSIS — I4891 Unspecified atrial fibrillation: Secondary | ICD-10-CM | POA: Diagnosis not present

## 2021-10-16 DIAGNOSIS — J449 Chronic obstructive pulmonary disease, unspecified: Secondary | ICD-10-CM

## 2021-10-16 NOTE — Progress Notes (Signed)
? ?History of Present Illness ?John Ball is a 81 y.o. male current every day smoker ( 90 pack year hsitory) with history of CAD s/p CABG in 2004 w/ LIMA-LAD, free RIMA-RI, SVG-OM2, SVG-RPDA, Tobacco use, severe COPD, h/o NSCLC s/p lobectomy in 2012, OSA on CPAP, PVD s/p Rt Fem-Pop bypass, Stage IIIa CKD, HTN, HLD and LE DVT. Marland KitchenHe requires home oxygen at 6 L  L [place]. He is followed by Dr. Halford Chessman. ? ?Maintenance : Advair and Spiriva and has been using Symbicort prn ?Rescue: Albuterol Inhaler and Nebulizer solution ?Last oxygen qualification 10/2020with  ? ?Pt was hospitalized with CHF exacerbation and COPD exacerbation 09/12/2021-09/17/2021. ? ? ?10/16/2021 ?Pt. Presents for hospital follow up. He was admitted 09/12/2021-09/17/2021. He was directly admitted from the cath lab after a failed stent attempt and cath, and new onset atrial fibrillation. He was diuresed, and he was placed on a milrinone drip and amiodarone drip. He converted from fib to NSR on amiodarone. He was weaned from both and converted to PO demedex  and amiodarone.He is on Eliquis. He is  compliant with these medications He is getting them filled at the [place] as the cost is high.   ? ?He also had a COPD flare while in the hospital. While in the hospital he required up to 11 L [place]. He is now on 6 L. He and his wife are working on getting him back  to  3-4 L as his oxygen saturations allow.  ?Pt actually looks better than he has in  the last several visits to the office. He has no ankle swelling. He is able to wear shoes he has not worn in a while. He is less winded.  ? ?Pt. Was trial ed on CPAP in the hospital which he did not tolerate it. With his history of OSA and now CHF we need to reassess degree of OSA with another in lab sleep study. We will discuss this at patients 1 month follow up. I have discussed this with patient's wife and she is in agreement with this.  ? ?Pt did ask his wife to reach out to Hospice when he was in the hospital and in the middle  of his CHF/ COPD  exacerbation.Palliative care and Hospice contacted both the patient and his wife.  ?Once he started feeling better, he decided he was not yet ready for Hospice.  ? ?Test Results: ?Cardiac Cath 09/12/2021 ? Origin lesion is 100% stenosed. ?  Origin lesion is 100% stenosed. ?  Mid RCA lesion is 100% stenosed. ?  Prox RCA lesion is 70% stenosed. ?  Prox LAD to Mid LAD lesion is 80% stenosed. ?  Mid LAD lesion is 100% stenosed. ?  Prox Cx lesion is 100% stenosed. ?  Ost LM to Mid LM lesion is 70% stenosed. ?  RPAV lesion is 100% stenosed. ?  ?1. Elevated left > right heart filling pressures.  ?2. Mixed pulmonary venous/pulmonary arterial hypertension.  The PAH component may be due to COPD with group 3 PH.  ?3. Low cardiac index.  ?4. Patent LIMA-LAD and free RIMA-ramus.  The RCA and SVG-RCA were known to the occluded.  The SVG-OM is newly-noted to be occluded (native LCX is occluded). No interventional target.  ?  ?With new atrial fibrillation, volume overload, and low CO, will admit patient.  ? ?08/09/2021 Echo ?Left ventricular ejection fraction, by estimation, is 20 to 25%. The  ?left ventricle has severely decreased function. The left ventricle  ?demonstrates global hypokinesis.  Left ventricular diastolic parameters are  ?consistent with Grade II diastolic  ?dysfunction (pseudonormalization). Elevated left ventricular end-diastolic  ?pressure.  ? 2. Right ventricular systolic function is severely reduced. The right  ?ventricular size is mildly enlarged. There is moderately elevated  ?pulmonary artery systolic pressure. The estimated right ventricular  ?systolic pressure is 91.4 mmHg.  ? 3. Left atrial size was severely dilated.  ? 4. The mitral valve is normal in structure. Moderate mitral valve  ?regurgitation. No evidence of mitral stenosis.  ? 5. Tricuspid valve regurgitation is moderate.  ? 6. The aortic valve is tricuspid. Aortic valve regurgitation is mild.  ?Aortic valve sclerosis/calcification  is present, without any evidence of  ?aortic stenosis.  ? 7. Aortic dilatation noted. There is mild dilatation of the aortic root,  ?measuring 39 mm. There is mild dilatation of the ascending aorta,  ?measuring 43 mm.  ? 8. The inferior vena cava is normal in size with greater than 50%  ?respiratory variability, suggesting right atrial pressure of 3 mmHg.  ? ? ?  Latest Ref Rng & Units 09/27/2021  ? 10:52 AM 09/17/2021  ?  4:41 AM 09/16/2021  ?  3:55 AM  ?CBC  ?WBC 4.0 - 10.5 K/uL 6.6   5.2   4.8    ?Hemoglobin 13.0 - 17.0 g/dL 13.8   12.7   13.1    ?Hematocrit 39.0 - 52.0 % 42.8   37.4   38.9    ?Platelets 150 - 400 K/uL 154   94   95    ? ? ? ?  Latest Ref Rng & Units 09/27/2021  ? 10:52 AM 09/17/2021  ?  4:41 AM 09/16/2021  ?  3:55 AM  ?BMP  ?Glucose 70 - 99 mg/dL 108   112   118    ?BUN 8 - 23 mg/dL 42   33   28    ?Creatinine 0.61 - 1.24 mg/dL 1.63   1.44   1.44    ?Sodium 135 - 145 mmol/L 138   135   130    ?Potassium 3.5 - 5.1 mmol/L 4.9   4.5   4.5    ?Chloride 98 - 111 mmol/L 98   91   86    ?CO2 22 - 32 mmol/L 33   34   31    ?Calcium 8.9 - 10.3 mg/dL 9.6   9.8   9.5    ? ? ?BNP ?   ?Component Value Date/Time  ? BNP 2,341.0 (H) 09/12/2021 1624  ? ? ?ProBNP ?No results found for: PROBNP ? ?PFT ?   ?Component Value Date/Time  ? FEV1PRE 1.83 02/25/2014 1610  ? FVCPRE 3.08 02/25/2014 1610  ? DLCOUNC 12.29 02/25/2014 1610  ? PREFEV1FVCRT 59 02/25/2014 1610  ? ? ?No results found. ? ? ?Past medical hx ?Past Medical History:  ?Diagnosis Date  ? Anxiety   ? Arthritis   ? Complication of anesthesia   ? hx prolonged post op oxygen dependent/  hx hallucinations for 2 day post op lobectomy 2012  ? COPD, severe ([place])   ? Depression   ? Essential hypertension   ? Hearing loss of both ears   ? does wear his hearing aids  ? History of arterial bypass of lower extremity   ? RIGHT FEM-POP  ? History of CHF (congestive heart failure)   ? History of DVT of lower extremity   ? History of panic attacks   ? Hyperlipidemia with target  LDL less than  61   ? Left main coronary artery disease 2004  ? a) Severe LM CAD 2004 -->s/p  cabg x4 (LIMA-LAD, free RIMA-RI, SVG-OM 2, SVG-RPDA); b) 06/2014: Abnormal Myoview --> c)cardiac Cath Jan 2016: pLM 70%, pLAD 40-50%- patent LIMA-LAD.  RI -competitive flow noted from RIMA-RI graft, circumflex, normal caliber with moderate OM1.  OM 2 occluded.  SVG-OM 2 patent. RCA CTO after large RV M, faint R-R and LAD septal-PDA collaterals =--> new  ? Neuropathy, peripheral   ? No natural teeth   ? does not wear his dentures  ? Nocturia   ? OSA (obstructive sleep apnea)   ? uses O2  via [place]  --  intolerate to CPAP  ? Peripheral arterial occlusive disease ([place])   ? 2011   a) Gore-Tex graft right AK popA-BK popA --> b) 2012: thrombosed graft --> thrombectomy with dacron patch = 1 V runnof via Peroneal; c) 01/2014: R femoropopliteal bypass with left femoral vein;; followed by dr Early--  per last dopplers 06-28-2014 no change right graft,  50-74% stenosis common and left mid superficial femoral artery  ? PTSD (post-traumatic stress disorder)   ? anxiety attack's---  [place] Vet  ? Recurrent productive cough   ? SMOKER'S COUGH  ? S/P CABG x 4 07/2002  ? a) LIMA-LAD, freeRIMA-RI, SVG-OM2, SVG-RPDA ; --> December 2015 Myoview with mostly fixed inferior defect --> cardiac cath January 2016 revealed CTO of native RCA and SVG RCA.  Medical management  ? Smoker unmotivated to quit   ? Reportedly he came close to having a nervous breakdown when he last tried to quit  ? 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 Stage IA  Non-small cell--- a) 04/2011: R L Lobectomy & med node dissection ([id number]) - [facility] [place], [place]) w/o post-op Rx; b) CT-A Chest Jan 2013: R pl effusion, R hilar LAN (3.3 cm x 2.8 cm & 2.2 cm x 1.3 cm); c) 2/'13 PET-CT Chest: Bilat Hilar LAN, no distant Mets  d) CT chest 9/'14: Stable shotty hilar nodes bilaterally w/ no pathologic sized LAD or suspicious Pulm nodule; ;;  (oncologist at  ? Wears glasses   ?   ? ?Social History  ? ?Tobacco Use  ? Smoking status: Every Day  ?  Packs/day: 1.00  ?  Years: 60.00  ?  Pack years: 60.00  ?  Types: Cigarettes  ?  Start date: 09/14/1957  ? Smokeless tobacco: Never  ? Tob

## 2021-10-16 NOTE — Patient Instructions (Addendum)
It is good to see you today. ?Continue your Pacerone, Eliquis  and Demedex as you have been doing.  ?Follow up with cardiology as is scheduled 11/09/2021 ?Continue Wixela  and Spiriva as you have been doing. ?Continue Albuterol as needed for shortness of breath or wheezing.  ?Wear oxygen at 6 Liters as you have been doing, work on getting this back to tour 3-4 L  ?Saturation goals are > 88% at all times ?Please continue to work on quitting smoking.  ?Do not wear oxygen if you are smoking.  ?Call cardiology for weight gain or ankle swelling.  ?Wake up, void, and then weigh.  ?Follow up in 1 months with Dr. Halford Ball or John Roch NP.  ?Call if you need [place] sooner.  ?Please contact office for sooner follow up if symptoms do not improve or worsen or seek emergency care   ? ?

## 2021-10-18 ENCOUNTER — Other Ambulatory Visit: Payer: Self-pay | Admitting: *Deleted

## 2021-10-18 NOTE — Patient Outreach (Signed)
[facility]) Care Management ?Telephonic RN Care Manager Note ? ? ?10/18/2021 ?Name:  John Ball MRN:  [id number] DOB:  13-Jun-1941 ? ?Summary: ?Completion of post hospital transition of care services ?[facility] Unsuccessful outreach to pt and wife numbers ?No answer of wife, Wells Guiles number [phone] & voice mail box full unable to leave a voice message ?Outreach attempt to the listed at the preferred outreach number in EPIC [phone] ?No answer. [facility] RN CM left HIPAA [facility] Portability and Accountability Act) compliant voicemail message along with CM?s contact info.  ? ? ?Subjective: ?John Ball is an 81 y.o. year old male who is a primary patient of Boscia, Greer Ee, NP. The care management team was consulted for assistance with care management and/or care coordination needs.   ?Mr John Ball was referred to [facility] on 09/18/21 post [place] hospital discharge (09/12/21 to 09/17/21) for COPD, CHF, Afib  ? ? ?Telephonic RN Care Manager completed Telephone Visit today.  ? ?Objective: ? ?Medications Reviewed Today   ? ? Reviewed by Magdalen Spatz, NP (Nurse Practitioner) on 10/16/21 at [place] List Status: <None>  ? ?Medication Order Taking? Sig Documenting Provider Last Dose Status Informant  ?acetaminophen (TYLENOL) 500 MG tablet [id number] Yes Take 1,000 mg by mouth every 6 (six) hours as needed for moderate pain or headache.  [provider] Taking Active Spouse/Significant Other  ?         ?Med Note Nash Mantis, [name]   Fri Jul 13, 2021 11:49 AM)    ?albuterol (PROVENTIL HFA;VENTOLIN HFA) 108 (90 BASE) MCG/ACT inhaler [id number] Yes Inhale 1 puff into the lungs every 6 (six) hours as needed for wheezing or shortness of breath. [provider] Taking Active Spouse/Significant Other  ?albuterol (PROVENTIL) (2.5 MG/3ML) 0.083% nebulizer solution [id number] Yes Take 2.5 mg by nebulization every 6 (six) hours as needed for wheezing or shortness of breath. [provider] Taking Active Spouse/Significant Other  ?amiodarone (PACERONE) 200 MG tablet [id number] Yes Take 1 tablet (200 mg total) by mouth daily. Conrad Co[place], NP Taking Active   ?apixaban (ELIQUIS) 5 MG TABS tablet [id number] Yes Take 1 tablet (5 mg total) by mouth 2 (two) times daily. Larey Dresser, MD Taking Active   ?cholecalciferol (VITAMIN D) 25 MCG (1000 UNIT) tablet [id number] Yes Take 1,000 Units by mouth in the morning. [provider] Taking Active Spouse/Significant Other  ?CYMBALTA 60 MG capsule [id number] Yes Take 60 mg by mouth in the morning. [provider] Taking Active Spouse/Significant Other  ?digoxin (LANOXIN) 0.125 MG tablet [id number] Yes Take 1 tablet (0.125 mg total) by mouth daily. Larey Dresser, MD Taking Active   ?empagliflozin (JARDIANCE) 10 MG TABS tablet [id number] Yes Take 1 tablet (10 mg total) by mouth daily. Larey Dresser, MD Taking Active   ?losartan (COZAAR) 25 MG tablet [id number] Yes Take 0.5 tablets (12.5 mg total) by mouth daily. Larey Dresser, MD Taking Active   ?nitroGLYCERIN (NITROSTAT) 0.4 MG SL tablet [id number] Yes Place 1 tablet (0.4 mg total) under the tongue every 5 (five) minutes as needed for chest pain. Leonie Man, MD Taking Active Spouse/Significant Other  ?         ?Med Note Lia Hopping, MINDY L   Thu Mar 01, 2021  9:14 AM)    ?OXYGEN [id number] Yes Inhale 4 L into the lungs continuous. Uses 4L at bedtime and if needed throughout the day -- intolerant to  CPAP [provider] Taking Active Spouse/Significant Other  ?prazosin (MINIPRESS) 1 MG capsule [id number] Yes Take 1 mg by mouth at bedtime. [provider] Taking Active Spouse/Significant Other  ?QUEtiapine Fumarate (SEROQUEL XR) 150 MG 24 hr tablet [id number] Yes Take 150 mg by mouth at bedtime. [provider] Taking Active Spouse/Significant Other  ?ramipril (ALTACE) 2.5 MG capsule [id number] Yes Take 1 capsule by mouth daily. [provider]  Taking Active   ?sodium chloride flush (NS) 0.9 % injection 3 mL [id number]   Warren Lacy, PA-C  Active   ?spironolactone (ALDACTONE) 25 MG tablet [id number] Yes Take 1 tablet (25 mg total) by mouth daily. Larey Dresser, MD Taking Active   ?Tiotropium Bromide Monohydrate 2.5 MCG/ACT AERS [id number] Yes Inhale 2 puffs into the lungs in the morning. [provider] Taking Active Spouse/Significant Other  ?torsemide (DEMADEX) 20 MG tablet [id number] Yes Take 0.5 tablets (10 mg total) by mouth daily. Larey Dresser, MD Taking Active   ?traZODone (DESYREL) 100 MG tablet [id number] Yes Take 100 mg by mouth at bedtime. [provider] Taking Active Spouse/Significant Other  ?WIXELA INHUB 250-50 MCG/ACT AEPB [id number] Yes Inhale 1 puff into the lungs 2 (two) times daily. [provider] Taking Active Spouse/Significant Other  ?Med List Note Tery Sanfilippo, CPhT 07/13/21 1224): [facility] Jule Ser (if [place] MD prescribes)  ? ?  ?  ? ?  ? ? ? ?SDOH:  (Social Determinants of Health) assessments and interventions performed:  ? ? ?Care Plan ? ?Review of patient past medical history, allergies, medications, health status, including review of consultants reports, laboratory and other test data, was performed as part of comprehensive evaluation for care management services.  ? ?There are no care plans that you recently modified to display for this patient. ?  ? ?Plan: The patient has been provided with contact information for the care management team and has been advised to call with any health related questions or concerns.  ?The care management team will reach out to the patient again over the next 30+ business days. ? ?Unsuccessful outreach on 10/18/21 ? ? ?Evva Din L. Lavina Hamman, RN, BSN, CCM ?[place] Management Care Coordinator ?Office number [phone] ?[facility] number [phone] ?Fax number [phone] ?

## 2021-10-18 NOTE — Progress Notes (Signed)
Reviewed and agree with assessment/plan. ? ? ?Denorris Reust, MD ?[facility] ?10/18/2021, 11:15 AM ?Pager:  [phone] ? ?

## 2021-11-09 ENCOUNTER — Telehealth (HOSPITAL_COMMUNITY): Payer: Self-pay

## 2021-11-09 ENCOUNTER — Encounter (HOSPITAL_COMMUNITY): Payer: Self-pay

## 2021-11-09 ENCOUNTER — Other Ambulatory Visit (HOSPITAL_COMMUNITY): Payer: Self-pay | Admitting: Family Medicine

## 2021-11-09 ENCOUNTER — Ambulatory Visit (HOSPITAL_COMMUNITY)
Admission: RE | Admit: 2021-11-09 | Discharge: 2021-11-09 | Disposition: A | Discharge status: Home / Self Care | Payer: Medicare Other | Source: Ambulatory Visit | Attending: Family Medicine | Admitting: Family Medicine

## 2021-11-09 VITALS — BP 100/60 | HR 78 | Wt 146.7 lb

## 2021-11-09 DIAGNOSIS — I25810 Atherosclerosis of coronary artery bypass graft(s) without angina pectoris: Secondary | ICD-10-CM | POA: Diagnosis not present

## 2021-11-09 DIAGNOSIS — I2510 Atherosclerotic heart disease of native coronary artery without angina pectoris: Secondary | ICD-10-CM | POA: Insufficient documentation

## 2021-11-09 DIAGNOSIS — I739 Peripheral vascular disease, unspecified: Secondary | ICD-10-CM | POA: Diagnosis not present

## 2021-11-09 DIAGNOSIS — J449 Chronic obstructive pulmonary disease, unspecified: Secondary | ICD-10-CM | POA: Diagnosis not present

## 2021-11-09 DIAGNOSIS — I779 Disorder of arteries and arterioles, unspecified: Secondary | ICD-10-CM

## 2021-11-09 DIAGNOSIS — Z9981 Dependence on supplemental oxygen: Secondary | ICD-10-CM | POA: Diagnosis not present

## 2021-11-09 DIAGNOSIS — Z79899 Other long term (current) drug therapy: Secondary | ICD-10-CM | POA: Diagnosis not present

## 2021-11-09 DIAGNOSIS — D696 Thrombocytopenia, unspecified: Secondary | ICD-10-CM | POA: Insufficient documentation

## 2021-11-09 DIAGNOSIS — Z7189 Other specified counseling: Secondary | ICD-10-CM

## 2021-11-09 DIAGNOSIS — Z902 Acquired absence of lung [part of]: Secondary | ICD-10-CM | POA: Insufficient documentation

## 2021-11-09 DIAGNOSIS — G4733 Obstructive sleep apnea (adult) (pediatric): Secondary | ICD-10-CM | POA: Diagnosis not present

## 2021-11-09 DIAGNOSIS — I5022 Chronic systolic (congestive) heart failure: Secondary | ICD-10-CM | POA: Insufficient documentation

## 2021-11-09 DIAGNOSIS — I272 Pulmonary hypertension, unspecified: Secondary | ICD-10-CM

## 2021-11-09 DIAGNOSIS — Z85118 Personal history of other malignant neoplasm of bronchus and lung: Secondary | ICD-10-CM | POA: Diagnosis not present

## 2021-11-09 DIAGNOSIS — I130 Hypertensive heart and chronic kidney disease with heart failure and stage 1 through stage 4 chronic kidney disease, or unspecified chronic kidney disease: Secondary | ICD-10-CM | POA: Diagnosis not present

## 2021-11-09 DIAGNOSIS — I480 Paroxysmal atrial fibrillation: Secondary | ICD-10-CM | POA: Diagnosis not present

## 2021-11-09 DIAGNOSIS — Z7984 Long term (current) use of oral hypoglycemic drugs: Secondary | ICD-10-CM | POA: Diagnosis not present

## 2021-11-09 DIAGNOSIS — R443 Hallucinations, unspecified: Secondary | ICD-10-CM | POA: Insufficient documentation

## 2021-11-09 DIAGNOSIS — Z7901 Long term (current) use of anticoagulants: Secondary | ICD-10-CM | POA: Diagnosis not present

## 2021-11-09 DIAGNOSIS — I2721 Secondary pulmonary arterial hypertension: Secondary | ICD-10-CM | POA: Insufficient documentation

## 2021-11-09 DIAGNOSIS — Z720 Tobacco use: Secondary | ICD-10-CM | POA: Diagnosis not present

## 2021-11-09 DIAGNOSIS — F4310 Post-traumatic stress disorder, unspecified: Secondary | ICD-10-CM | POA: Diagnosis not present

## 2021-11-09 DIAGNOSIS — I5032 Chronic diastolic (congestive) heart failure: Secondary | ICD-10-CM

## 2021-11-09 LAB — BASIC METABOLIC PANEL
Anion gap: 11 (ref 5–15)
BUN: 32 mg/dL — ABNORMAL HIGH (ref 8–23)
CO2: 33 mmol/L — ABNORMAL HIGH (ref 22–32)
Calcium: 9.9 mg/dL (ref 8.9–10.3)
Chloride: 95 mmol/L — ABNORMAL LOW (ref 98–111)
Creatinine, Ser: 1.23 mg/dL (ref 0.61–1.24)
GFR, Estimated: >60 mL/min (ref >60)
Glucose, Bld: 98 mg/dL (ref 70–99)
Potassium: 5.2 mmol/L — ABNORMAL HIGH (ref 3.5–5.1)
Sodium: 139 mmol/L (ref 135–145)

## 2021-11-09 LAB — DIGOXIN LEVEL: Digoxin Level: 1.2 ng/mL (ref 0.8–2.0)

## 2021-11-09 MED ORDER — SPIRONOLACTONE 25 MG PO TABS: 12.5000 mg | ORAL_TABLET | Freq: Every day | ORAL | 5 refills | Status: AC

## 2021-11-09 MED ORDER — AMIODARONE HCL 200 MG PO TABS: 100.0000 mg | ORAL_TABLET | Freq: Every day | ORAL | 2 refills | Status: DC
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
## 2021-11-09 NOTE — Telephone Encounter (Signed)
Patients wife called to report that patient has never been on amiodarone. Do you want him to restart it at 200mg  QD? Or still decrease?  ?

## 2021-11-09 NOTE — Patient Instructions (Addendum)
Thank you for coming in today ? ?Labs were done today, if any labs are abnormal the clinic will call you ?No news is good news ? ?STOP Digoxin ? ?DECREASE Amiodarone to 100 mg 1/2 tablet daily  ? ?Your physician recommends that you schedule a follow-up appointment in:  ?3 months with echocardiogram with Dr. Aundra Dubin ? ?At the [facility], you and your health needs are our priority. As part of our continuing mission to provide you with exceptional heart care, we have created designated Provider Care Teams. These Care Teams include your primary Cardiologist (physician) and Advanced Practice Providers (APPs- Physician Assistants and Nurse Practitioners) who all work together to provide you with the care you need, when you need it.  ? ?You may see any of the following providers on your designated Care Team at your next follow up: ?Dr Glori Bickers ?Dr Loralie Champagne ?Darrick Grinder, NP ?Lyda Jester, PA ?Jessica Milford,NP ?Marlyce Huge, PA ?Audry Riles, PharmD ? ? ?Please be sure to bring in all your medications bottles to every appointment.  ? ?If you have any questions or concerns before your next appointment please send [place] a message through [place] or call our office at [phone].   ? ?TO LEAVE A MESSAGE FOR THE NURSE SELECT OPTION 2, PLEASE LEAVE A MESSAGE INCLUDING: ?YOUR NAME ?DATE OF BIRTH ?CALL BACK NUMBER ?REASON FOR CALL**this is important as we prioritize the call backs ? ?YOU WILL RECEIVE A CALL BACK THE SAME DAY AS LONG AS YOU CALL BEFORE 4:00 PM ? ?

## 2021-11-16 ENCOUNTER — Ambulatory Visit (HOSPITAL_COMMUNITY)
Admission: RE | Admit: 2021-11-16 | Discharge: 2021-11-16 | Disposition: A | Discharge status: Home / Self Care | Payer: Medicare Other | Source: Ambulatory Visit | Attending: Pulmonary Disease | Admitting: Pulmonary Disease

## 2021-11-16 DIAGNOSIS — R911 Solitary pulmonary nodule: Secondary | ICD-10-CM | POA: Insufficient documentation

## 2021-11-16 DIAGNOSIS — I5032 Chronic diastolic (congestive) heart failure: Secondary | ICD-10-CM | POA: Diagnosis not present

## 2021-11-16 DIAGNOSIS — J90 Pleural effusion, not elsewhere classified: Secondary | ICD-10-CM | POA: Diagnosis not present

## 2021-11-16 DIAGNOSIS — J439 Emphysema, unspecified: Secondary | ICD-10-CM | POA: Diagnosis not present

## 2021-11-16 LAB — BASIC METABOLIC PANEL
Anion gap: 7 (ref 5–15)
BUN: 27 mg/dL — ABNORMAL HIGH (ref 8–23)
CO2: 32 mmol/L (ref 22–32)
Calcium: 9.4 mg/dL (ref 8.9–10.3)
Chloride: 100 mmol/L (ref 98–111)
Creatinine, Ser: 1.28 mg/dL — ABNORMAL HIGH (ref 0.61–1.24)
GFR, Estimated: 57 mL/min — ABNORMAL LOW (ref >60)
Glucose, Bld: 127 mg/dL — ABNORMAL HIGH (ref 70–99)
Potassium: 4.4 mmol/L (ref 3.5–5.1)
Sodium: 139 mmol/L (ref 135–145)

## 2021-11-20 ENCOUNTER — Telehealth (HOSPITAL_COMMUNITY): Payer: Self-pay | Admitting: *Deleted

## 2021-11-20 ENCOUNTER — Encounter: Payer: Self-pay | Admitting: Acute Care

## 2021-11-20 ENCOUNTER — Ambulatory Visit (INDEPENDENT_AMBULATORY_CARE_PROVIDER_SITE_OTHER): Payer: Medicare Other | Admitting: Acute Care

## 2021-11-20 VITALS — BP 98/58 | HR 79 | Temp 98.2°F | Ht 72.0 in | Wt 148.0 lb

## 2021-11-20 DIAGNOSIS — I509 Heart failure, unspecified: Secondary | ICD-10-CM

## 2021-11-20 DIAGNOSIS — F17210 Nicotine dependence, cigarettes, uncomplicated: Secondary | ICD-10-CM

## 2021-11-20 DIAGNOSIS — J9611 Chronic respiratory failure with hypoxia: Secondary | ICD-10-CM

## 2021-11-20 DIAGNOSIS — I779 Disorder of arteries and arterioles, unspecified: Secondary | ICD-10-CM | POA: Diagnosis not present

## 2021-11-20 DIAGNOSIS — Z720 Tobacco use: Secondary | ICD-10-CM

## 2021-11-20 DIAGNOSIS — R911 Solitary pulmonary nodule: Secondary | ICD-10-CM

## 2021-11-20 DIAGNOSIS — J449 Chronic obstructive pulmonary disease, unspecified: Secondary | ICD-10-CM | POA: Diagnosis not present

## 2021-11-20 DIAGNOSIS — J432 Centrilobular emphysema: Secondary | ICD-10-CM

## 2021-11-20 NOTE — Telephone Encounter (Signed)
Pts wife called stating pts bp has been running low 90's/50's and he's been c/o dizziness. Wife said pt saw pulmonology today and was told to contact our office to decrease meds. ? ?Routed to [facility] for advice  ?

## 2021-11-20 NOTE — Progress Notes (Signed)
Reviewed and agree with assessment/plan. ? ? ?Chesley Mires, MD ?[facility] ?11/20/2021, 1:46 PM ?Pager:  [phone] ? ?

## 2021-11-20 NOTE — Progress Notes (Signed)
? ?History of Present Illness ?John Ball is a 81 y.o. male current every day smoker ( 90 pack year hsitory) with history of CAD s/p CABG in 2004 w/ LIMA-LAD, free RIMA-RI, SVG-OM2, SVG-RPDA, Tobacco use, severe COPD, h/o NSCLC s/p lobectomy in 2012, OSA on CPAP, PVD s/p Rt Fem-Pop bypass, Stage IIIa CKD, HTN, HLD and LE DVT. Marland KitchenHe requires home oxygen at 6 L  L [place]. He is followed by Dr. Halford Chessman. ?  ?Maintenance : Advair and Spiriva and has been using Symbicort prn ?Rescue: Albuterol Inhaler and Nebulizer solution ?Last oxygen qualification 10/2020with  ?  ?Pt was hospitalized with CHF exacerbation and COPD exacerbation 09/12/2021-09/17/2021. ?  ? ? ?11/20/2021 ?Pt. Presents for follow up. He states he has been doing well. His BP is low today in the office. He is on multiple BP meds as well as diuretics. He is compliant with these. I checked BP in both arms and it was 88/40 in the right arm, 98/58 in the left.  ?I have asked his wife to call his cardiologist/ heart failure clinic  today to see if they need to make a medication adjustment.  ?He is tolerating the switch from Advair to Symbicort. He states his breathing is about the same. He is still smoking 3-4 cigarettes a day. He states he uses his rescue inhaler about 4 times a day.  ?CT chest shows increase in pulmonary nodule size from 5 mm to 9 mm. He is not a candidate for biopsy as risk of anesthetic event is too high with his underlying lung disease. He has had weight gain , which is reassuring. We will do a 3 month follow up. If the nodule has had  additional growth, we will see if radiation oncology will consider radiation tx without diagnosis of malignancy. No lower extremity edema, he can finally wear his shoes again.  ? ?Test Results ?:Observations/Objective: ?CT Chest without contrast 11/16/2021 ?Left lower lobe pulmonary nodule now measuring 9 mm versus 5 mm ?on the prior exam. Consider one of the following in 3 months for ?both low-risk and high-risk  individuals: (a) repeat chest CT, (b) ?follow-up PET-CT, or (c) tissue sampling ?Stable 4 mm right middle lobe nodule. ?Status post right lower lobectomy with small loculated pleural ?effusion, unchanged. ?Multi-vessel coronary artery calcifications. ?Aortic atherosclerosis. ? ?Echo 09/26/17 >> EF 50 to 55%, grade 1 DD, PAS 36 mmHg ?  ?Spirometry 03/05/16 >> FEV1 1.21 (36%), FEV1% 54% ?ABG 03/07/16 >> pH 7.40, PaCO2 42.6, PaO2 59.3 ?  ?CT chest 04/20/15 >> atherosclerosis, mild centrilobular/paraseptal emphysema, s/p RLL ectomy ?CT angio chest 09/12/17 >> thickened Rt diaphragm, s/p RLLectomy, tree in bud ?CT chest 08/19/18 >> centrilobular/paraseptal emphysema, volume loss Rt hemithorax, decreased LAN ?CT chest 07/28/19 >> stable 4 mm RML nodule, new 3 mm nodule in lingula ?CT Chest 07/21/2020>> Status post right lower lobectomy, without findings of metastatic or recurrent disease. ?The lingular nodule is no longer identified. Right middle lobe and right apical pulmonary nodules are unchanged, likely benign.Hepatic morphology for which cirrhosis is possible. Correlate ?with risk factors ? ? ?  Latest Ref Rng & Units 09/27/2021  ? 10:52 AM 09/17/2021  ?  4:41 AM 09/16/2021  ?  3:55 AM  ?CBC  ?WBC 4.0 - 10.5 K/uL 6.6   5.2   4.8    ?Hemoglobin 13.0 - 17.0 g/dL 13.8   12.7   13.1    ?Hematocrit 39.0 - 52.0 % 42.8   37.4   38.9    ?  Platelets 150 - 400 K/uL 154   94   95    ? ? ? ?  Latest Ref Rng & Units 11/16/2021  ? 11:52 AM 11/09/2021  ? 10:39 AM 09/27/2021  ? 10:52 AM  ?BMP  ?Glucose 70 - 99 mg/dL 127   98   108    ?BUN 8 - 23 mg/dL 27   32   42    ?Creatinine 0.61 - 1.24 mg/dL 1.28   1.23   1.63    ?Sodium 135 - 145 mmol/L 139   139   138    ?Potassium 3.5 - 5.1 mmol/L 4.4   5.2   4.9    ?Chloride 98 - 111 mmol/L 100   95   98    ?CO2 22 - 32 mmol/L 32   33   33    ?Calcium 8.9 - 10.3 mg/dL 9.4   9.9   9.6    ? ? ?BNP ?   ?Component Value Date/Time  ? BNP 2,341.0 (H) 09/12/2021 1624  ? ? ?ProBNP ?No results found for:  PROBNP ? ?PFT ?   ?Component Value Date/Time  ? FEV1PRE 1.83 02/25/2014 1610  ? FVCPRE 3.08 02/25/2014 1610  ? DLCOUNC 12.29 02/25/2014 1610  ? PREFEV1FVCRT 59 02/25/2014 1610  ? ? ?CT Chest Wo Contrast ? ?Result Date: 11/19/2021 ?CLINICAL DATA:  Lung nodule, history of lung cancer with lobectomy. Shortness of breath. EXAM: CT CHEST WITHOUT CONTRAST TECHNIQUE: Multidetector CT imaging of the chest was performed following the standard protocol without IV contrast. RADIATION DOSE REDUCTION: This exam was performed according to the departmental dose-optimization program which includes automated exposure control, adjustment of the mA and/or kV according to patient size and/or use of iterative reconstruction technique. COMPARISON:  07/30/2021. FINDINGS: Cardiovascular: The heart is normal in size and there is no pericardial effusion. Multi-vessel coronary artery calcifications are noted. There is atherosclerotic calcification of the aorta without evidence of aneurysm. The pulmonary trunk is normal in caliber. Mediastinum/Nodes: No mediastinal or axillary lymphadenopathy. Evaluation of the hila is limited due to lack of IV contrast. The thyroid gland, trachea, and esophagus are within normal limits. Lungs/Pleura: Emphysematous changes are present in the lungs and mild apical pleural scarring is noted bilaterally. Postsurgical changes are noted at the right lung base with mild atelectasis or scarring related to prior right lower lobectomy. A small loculated right pleural effusion is noted, unchanged. There is a 4 mm nodule in the right middle lobe, axial image 89, unchanged. There is a 9 mm spiculated nodule with central lucency in the left lower lobe, axial image 90, versus 5 mm on the prior exam. No new nodule is identified. Upper Abdomen: Nonobstructive left renal calculus. Vascular calcifications are noted at the renal hila bilaterally. No acute osseous abnormality. Musculoskeletal: Sternotomy wires are present over the  midline. Degenerative changes are noted in the thoracic spine. No acute or suspicious osseous abnormality is seen. IMPRESSION: 1. Left lower lobe pulmonary nodule now measuring 9 mm versus 5 mm on the prior exam. Consider one of the following in 3 months for both low-risk and high-risk individuals: (a) repeat chest CT, (b) follow-up PET-CT, or (c) tissue sampling. This recommendation follows the consensus statement: Guidelines for Management of Incidental Pulmonary Nodules Detected on CT Images: From the Fleischner Society 2017; Radiology 2017; 284:228-243. 2. Stable 4 mm right middle lobe nodule. 3. Status post right lower lobectomy with small loculated pleural effusion, unchanged. 4. Multi-vessel coronary artery calcifications. 5. Aortic atherosclerosis.  Electronically Signed   By: Brett Fairy M.D.   On: 11/19/2021 01:38   ? ? ?Past medical hx ?Past Medical History:  ?Diagnosis Date  ? Anxiety   ? Arthritis   ? Complication of anesthesia   ? hx prolonged post op oxygen dependent/  hx hallucinations for 2 day post op lobectomy 2012  ? COPD, severe ([place])   ? Depression   ? Essential hypertension   ? Hearing loss of both ears   ? does wear his hearing aids  ? History of arterial bypass of lower extremity   ? RIGHT FEM-POP  ? History of CHF (congestive heart failure)   ? History of DVT of lower extremity   ? History of panic attacks   ? Hyperlipidemia with target LDL less than 70   ? Left main coronary artery disease 2004  ? a) Severe LM CAD 2004 -->s/p  cabg x4 (LIMA-LAD, free RIMA-RI, SVG-OM 2, SVG-RPDA); b) 06/2014: Abnormal Myoview --> c)cardiac Cath Jan 2016: pLM 70%, pLAD 40-50%- patent LIMA-LAD.  RI -competitive flow noted from RIMA-RI graft, circumflex, normal caliber with moderate OM1.  OM 2 occluded.  SVG-OM 2 patent. RCA CTO after large RV M, faint R-R and LAD septal-PDA collaterals =--> new  ? Neuropathy, peripheral   ? No natural teeth   ? does not wear his dentures  ? Nocturia   ? OSA (obstructive  sleep apnea)   ? uses O2  via [place]  --  intolerate to CPAP  ? Peripheral arterial occlusive disease ([place])   ? 2011   a) Gore-Tex graft right [place] popA-BK popA --> b) 2012: thrombosed graft --> thrombectomy with dacron p

## 2021-11-20 NOTE — Patient Instructions (Addendum)
It is good to see you today. ?Continue Symbicort 2 puffs twice daily as you have been doing ?Rinse mouth after use ?Use rescue inhaler as needed for breakthrough shortness of breath or wheezing ?Follow up CT Chest without in 3 months to re-evaluate LLL pulmonary nodule( this has grown from 5 mm to 9 mm on current imaging)  ?We will change July appointment to August 2023 after the 3 month  follow up CT Chest without contrast ?Keep working on trying to quit smoking completely.  ?Please call heart failure clinic, let them know your BP and that you have been dizzy.  ?Fall precautions, hold onto counter tops,use walker, or always have assistance when going from sitting to standing or lying to standing. ?Note your daily symptoms > remember "red flags" for COPD:  Increase in cough, increase in sputum production, increase in shortness of breath or activity intolerance. If you notice these symptoms, please call to be seen.    ?Please contact office for sooner follow up if symptoms do not improve or worsen or seek emergency care   ?  ? ?

## 2021-11-21 ENCOUNTER — Other Ambulatory Visit: Payer: Self-pay | Admitting: *Deleted

## 2021-11-21 MED ORDER — LOSARTAN POTASSIUM 25 MG PO TABS
12.5000 mg | ORAL_TABLET | Freq: Every day | ORAL | 5 refills | Status: AC
Start: 2021-11-21 — End: ?

## 2021-11-21 NOTE — Telephone Encounter (Signed)
Wife aware

## 2021-11-21 NOTE — Patient Outreach (Signed)
[facility]) Care Management ?Telephonic RN Care Manager Note ? ? ?11/21/2021 ?Name:  John Ball MRN:  [id number] DOB:  Nov 14, 1940 ? ?Summary: ?Successful second follow up attempt to wife John Ball ?John Ball reports John Ball is improving ?She reports he has gone to his follow up appointments to include his pcp, cardiology and pulmonology ?She reports recent low BPs and hypertension medications decreased by cardiology ?John Ball voiced understanding and appreciation of future [facility] outreach availability and scheduled outreaches  ? ?Recommendations/Changes made from today's visit: ?Assessed for care coordination and disease management home needs ?Reviewed the importance of BP monitoring and prevention of hypotension episodes for confusion, dizziness, and potential for safety and fall concerns  ?Encouragement provided ?Reviewed process from transition of care to complex care [place] & prn calls to RN CM encouraged to continue to understand home disease management and handle any care coordination ? ? ?Subjective: ?John Ball is an 81 y.o. year old male who is a primary patient of Boscia, Greer Ee, NP. The care management team was consulted for assistance with care management and/or care coordination needs.   ? ?Telephonic RN Care Manager completed Telephone Visit today.  ? ?Objective: ? ?Medications Reviewed Today   ? ? Reviewed by Magdalen Spatz, NP (Nurse Practitioner) on 11/20/21 at 1334  Med List Status: <None>  ? ?Medication Order Taking? Sig Documenting Provider Last Dose Status Informant  ?acetaminophen (TYLENOL) 500 MG tablet [id number] Yes Take 1,000 mg by mouth every 6 (six) hours as needed for moderate pain or headache.  [provider] Taking Active Spouse/Significant Other  ?         ?Med Note Nash Mantis, [name]   Fri Jul 13, 2021 11:49 AM)    ?albuterol (PROVENTIL HFA;VENTOLIN HFA) 108 (90 BASE) MCG/ACT inhaler [id number] Yes Inhale 1 puff into the lungs every 6 (six)  hours as needed for wheezing or shortness of breath. [provider] Taking Active Spouse/Significant Other  ?albuterol (PROVENTIL) (2.5 MG/3ML) 0.083% nebulizer solution [id number] Yes Take 2.5 mg by nebulization every 6 (six) hours as needed for wheezing or shortness of breath. [provider] Taking Active Spouse/Significant Other  ?amiodarone (PACERONE) 200 MG tablet [id number] Yes Take 0.5 tablets (100 mg total) by mouth daily. Milford, Maricela Bo, FNP Taking Active   ?apixaban (ELIQUIS) 5 MG TABS tablet [id number] Yes Take 1 tablet (5 mg total) by mouth 2 (two) times daily. Larey Dresser, MD Taking Active   ?cholecalciferol (VITAMIN D) 25 MCG (1000 UNIT) tablet [id number] Yes Take 1,000 Units by mouth in the morning. [provider] Taking Active Spouse/Significant Other  ?CYMBALTA 60 MG capsule [id number] Yes Take 60 mg by mouth in the morning. [provider] Taking Active Spouse/Significant Other  ?empagliflozin (JARDIANCE) 10 MG TABS tablet [id number] Yes Take 1 tablet (10 mg total) by mouth daily. Larey Dresser, MD Taking Active   ?losartan (COZAAR) 25 MG tablet [id number] Yes Take 0.5 tablets (12.5 mg total) by mouth daily. Larey Dresser, MD Taking Active   ?nitroGLYCERIN (NITROSTAT) 0.4 MG SL tablet [id number] Yes Place 1 tablet (0.4 mg total) under the tongue every 5 (five) minutes as needed for chest pain. Leonie Man, MD Taking Active Spouse/Significant Other  ?         ?Med Note Lia Hopping, MINDY L   Thu Mar 01, 2021  9:14 AM)    ?OXYGEN [id number] Yes Inhale 5 L into the lungs continuous. Uses 4L at bedtime and if needed throughout the  day -- intolerant to CPAP [provider] Taking Active Spouse/Significant Other  ?prazosin (MINIPRESS) 1 MG capsule [id number] Yes Take 1 mg by mouth at bedtime. [provider] Taking Active Spouse/Significant Other  ?QUEtiapine Fumarate (SEROQUEL XR) 150 MG 24 hr tablet [id number] Yes Take 150 mg by mouth at  bedtime. [provider] Taking Active Spouse/Significant Other  ?sodium chloride flush (NS) 0.9 % injection 3 mL [id number]   Warren Lacy, PA-C  Active   ?spironolactone (ALDACTONE) 25 MG tablet [id number] Yes Take 0.5 tablets (12.5 mg total) by mouth daily. Milford, Maricela Bo, FNP Taking Active   ?Tiotropium Bromide Monohydrate 2.5 MCG/ACT AERS [id number] Yes Inhale 2 puffs into the lungs in the morning. [provider] Taking Active Spouse/Significant Other  ?torsemide (DEMADEX) 20 MG tablet [id number] Yes Take 0.5 tablets (10 mg total) by mouth daily. Larey Dresser, MD Taking Active   ?traZODone (DESYREL) 100 MG tablet [id number] Yes Take 100 mg by mouth at bedtime. [provider] Taking Active Spouse/Significant Other  ?WIXELA INHUB 250-50 MCG/ACT AEPB [id number] Yes Inhale 1 puff into the lungs 2 (two) times daily. [provider] Taking Active Spouse/Significant Other  ?Med List Note Tery Sanfilippo, CPhT 07/13/21 1224): [facility] Ser (if [place] MD prescribes)  ? ?  ?  ? ?  ? ? ? ?SDOH:  (Social Determinants of Health) assessments and interventions performed:  ? ? ?Care Plan ? ?Review of patient past medical history, allergies, medications, health status, including review of consultants reports, laboratory and other test data, was performed as part of comprehensive evaluation for care management services.  ? ?Care Plan : RN Care Manager Plan of Care  ?Updates made by Barbaraann Faster, RN since 11/21/2021 12:00 AM  ?  ? ?Problem: Complex Care Coordination Needs and disease management in patient with CHF   ?Priority: High  ?Onset Date: 09/20/2021  ?  ? ?Long-Range Goal: Establish Plan of Care for Management Complex SDOH Barriers, disease management and Care Coordination Needs in patient with CHF   ?Start Date: 09/20/2021  ?This Visit's Progress: On track  ?Recent Progress: On track  ?Priority: High  ?Note:   ?Current Barriers:  ?Knowledge Deficits related to plan of care for  management of CHF  ?Care Coordination needs related to Limited education about CHF home management* ?Barriers: knowledge, health behaviors, mental health (PTSD, Depression, Bipolar 1, anxiety), patient interest in palliative care/hospice, preference to not have health staff in the home/preference of only having his wife as his caregiver ? ?RN CM Clinical Goal(s):  ?Patient will demonstrate Improved adherence to prescribed treatment plan for CHF as evidenced by decrease re admission risk in next 30 days  through collaboration with RN Care manager, provider, and care team.  ? ?Interventions: ?Outreaches for care coordination, disease management, resources, home care/education needs with wife and patient ?Inter-disciplinary care team collaboration (see longitudinal plan of care) ?Evaluation of current treatment plan related to  self management and patient's adherence to plan as established by provider ?10/04/21 EMMI education on post- traumatic stress disoder e-mailed ? ?Heart Failure Interventions:  (Status:  Goal on track:  Yes.) Long Term Goal 11/21/21 Denies wt increases but with sob with minimal exertion  ?Assessed need for readable accurate scales in home ?Discussed importance of daily weight and advised patient to weigh and record daily ?Reviewed role of diuretics in prevention of fluid overload and management of heart failure; ?Discussed the importance of keeping all appointments with provider ?Screening for signs and symptoms of depression  related to chronic disease state  ?Assessed social determinant of health barriers  ?11/21/21 Reviewed the importance of BP monitoring and prevention of hypotension episodes for confusion, dizziness, and potential for safety and fall concerns  ?09/20/21 Review discharged instructions with wife  ?Commended wife for being patient's best advocate ?Reviewed [facility] program, transition of care outreaches and next follow up ?10/04/21 EMMI education on Heart failure: full program  e-mailed ? ?Interdisciplinary Collaboration Interventions:  (Status: Goal on track:  Yes.) Short Term Goal   ?Collaborated with RN CM to initiate plan of care to address needs related to Level of care concerns and [place]

## 2021-11-21 NOTE — Telephone Encounter (Signed)
Pt wife said patient already take spiro 12.5mg  daily ?

## 2021-12-05 ENCOUNTER — Encounter: Payer: Self-pay | Admitting: Nurse Practitioner

## 2021-12-20 ENCOUNTER — Ambulatory Visit: Payer: Medicare Other | Admitting: Nurse Practitioner

## 2022-01-09 ENCOUNTER — Other Ambulatory Visit: Payer: Self-pay | Admitting: *Deleted

## 2022-01-09 DIAGNOSIS — H538 Other visual disturbances: Secondary | ICD-10-CM | POA: Insufficient documentation

## 2022-01-09 NOTE — Patient Outreach (Signed)
[facility]) Care Management  01/09/2022  John Ball [birth year] [id number]   [facility] Unsuccessful outreach with case closure  Outreach attempt to the listed at the preferred outreach number in [place] mobile number Voice mailbox full No answer.    No answer at [phone] home number [facility] RN CM left HIPAA ([place] and Accountability Act) compliant voicemail message along with CM's contact info to include info that RN CM will be re assign and a new [facility] RN CM outreach is pending in future Encouraged a return call if any questions       Plan: Case closure with pending further outreach from another [facility] RN CM ([place] pod) Letters to pt and [employer] L. Lavina Hamman, RN, BSN, [place] Coordinator Office number [phone]

## 2022-01-15 ENCOUNTER — Ambulatory Visit: Payer: Medicare Other | Admitting: Acute Care

## 2022-01-17 ENCOUNTER — Inpatient Hospital Stay (HOSPITAL_COMMUNITY)
Admission: EM | Admit: 2022-01-17 | Discharge: 2022-01-19 | DRG: 291 | Disposition: A | Discharge status: Home / Self Care | Payer: Medicare Other | Attending: Internal Medicine | Admitting: Internal Medicine

## 2022-01-17 ENCOUNTER — Emergency Department (HOSPITAL_COMMUNITY): Payer: Medicare Other

## 2022-01-17 DIAGNOSIS — F319 Bipolar disorder, unspecified: Secondary | ICD-10-CM | POA: Diagnosis not present

## 2022-01-17 DIAGNOSIS — I10 Essential (primary) hypertension: Secondary | ICD-10-CM | POA: Diagnosis present

## 2022-01-17 DIAGNOSIS — J449 Chronic obstructive pulmonary disease, unspecified: Secondary | ICD-10-CM | POA: Diagnosis present

## 2022-01-17 DIAGNOSIS — Z9981 Dependence on supplemental oxygen: Secondary | ICD-10-CM | POA: Diagnosis not present

## 2022-01-17 DIAGNOSIS — M1990 Unspecified osteoarthritis, unspecified site: Secondary | ICD-10-CM | POA: Diagnosis present

## 2022-01-17 DIAGNOSIS — I5043 Acute on chronic combined systolic (congestive) and diastolic (congestive) heart failure: Secondary | ICD-10-CM | POA: Diagnosis not present

## 2022-01-17 DIAGNOSIS — I110 Hypertensive heart disease with heart failure: Secondary | ICD-10-CM | POA: Diagnosis not present

## 2022-01-17 DIAGNOSIS — J9621 Acute and chronic respiratory failure with hypoxia: Secondary | ICD-10-CM | POA: Diagnosis present

## 2022-01-17 DIAGNOSIS — I739 Peripheral vascular disease, unspecified: Secondary | ICD-10-CM | POA: Diagnosis not present

## 2022-01-17 DIAGNOSIS — E785 Hyperlipidemia, unspecified: Secondary | ICD-10-CM | POA: Diagnosis not present

## 2022-01-17 DIAGNOSIS — Z888 Allergy status to other drugs, medicaments and biological substances status: Secondary | ICD-10-CM | POA: Diagnosis not present

## 2022-01-17 DIAGNOSIS — F332 Major depressive disorder, recurrent severe without psychotic features: Secondary | ICD-10-CM

## 2022-01-17 DIAGNOSIS — I5023 Acute on chronic systolic (congestive) heart failure: Secondary | ICD-10-CM | POA: Diagnosis not present

## 2022-01-17 DIAGNOSIS — Z86718 Personal history of other venous thrombosis and embolism: Secondary | ICD-10-CM

## 2022-01-17 DIAGNOSIS — C7A090 Malignant carcinoid tumor of the bronchus and lung: Secondary | ICD-10-CM | POA: Diagnosis not present

## 2022-01-17 DIAGNOSIS — I779 Disorder of arteries and arterioles, unspecified: Secondary | ICD-10-CM | POA: Diagnosis present

## 2022-01-17 DIAGNOSIS — I482 Chronic atrial fibrillation, unspecified: Secondary | ICD-10-CM | POA: Diagnosis not present

## 2022-01-17 DIAGNOSIS — G629 Polyneuropathy, unspecified: Secondary | ICD-10-CM | POA: Diagnosis present

## 2022-01-17 DIAGNOSIS — Z886 Allergy status to analgesic agent status: Secondary | ICD-10-CM

## 2022-01-17 DIAGNOSIS — J90 Pleural effusion, not elsewhere classified: Secondary | ICD-10-CM | POA: Diagnosis not present

## 2022-01-17 DIAGNOSIS — R0602 Shortness of breath: Secondary | ICD-10-CM | POA: Diagnosis not present

## 2022-01-17 DIAGNOSIS — Z951 Presence of aortocoronary bypass graft: Secondary | ICD-10-CM | POA: Diagnosis not present

## 2022-01-17 DIAGNOSIS — I255 Ischemic cardiomyopathy: Secondary | ICD-10-CM | POA: Diagnosis present

## 2022-01-17 DIAGNOSIS — J9622 Acute and chronic respiratory failure with hypercapnia: Secondary | ICD-10-CM | POA: Diagnosis present

## 2022-01-17 DIAGNOSIS — Z85118 Personal history of other malignant neoplasm of bronchus and lung: Secondary | ICD-10-CM

## 2022-01-17 DIAGNOSIS — Z885 Allergy status to narcotic agent status: Secondary | ICD-10-CM | POA: Diagnosis not present

## 2022-01-17 DIAGNOSIS — I5032 Chronic diastolic (congestive) heart failure: Secondary | ICD-10-CM | POA: Diagnosis present

## 2022-01-17 DIAGNOSIS — I2510 Atherosclerotic heart disease of native coronary artery without angina pectoris: Secondary | ICD-10-CM | POA: Diagnosis not present

## 2022-01-17 DIAGNOSIS — R0609 Other forms of dyspnea: Secondary | ICD-10-CM | POA: Diagnosis not present

## 2022-01-17 DIAGNOSIS — Z7984 Long term (current) use of oral hypoglycemic drugs: Secondary | ICD-10-CM

## 2022-01-17 DIAGNOSIS — Z823 Family history of stroke: Secondary | ICD-10-CM

## 2022-01-17 DIAGNOSIS — Z79899 Other long term (current) drug therapy: Secondary | ICD-10-CM | POA: Diagnosis not present

## 2022-01-17 DIAGNOSIS — F4310 Post-traumatic stress disorder, unspecified: Secondary | ICD-10-CM | POA: Diagnosis present

## 2022-01-17 DIAGNOSIS — Z7901 Long term (current) use of anticoagulants: Secondary | ICD-10-CM

## 2022-01-17 DIAGNOSIS — Z7951 Long term (current) use of inhaled steroids: Secondary | ICD-10-CM

## 2022-01-17 DIAGNOSIS — G4733 Obstructive sleep apnea (adult) (pediatric): Secondary | ICD-10-CM | POA: Diagnosis present

## 2022-01-17 DIAGNOSIS — I509 Heart failure, unspecified: Secondary | ICD-10-CM

## 2022-01-17 DIAGNOSIS — F17210 Nicotine dependence, cigarettes, uncomplicated: Secondary | ICD-10-CM | POA: Diagnosis present

## 2022-01-17 DIAGNOSIS — Z20822 Contact with and (suspected) exposure to covid-19: Secondary | ICD-10-CM | POA: Diagnosis present

## 2022-01-17 DIAGNOSIS — I4891 Unspecified atrial fibrillation: Secondary | ICD-10-CM

## 2022-01-17 DIAGNOSIS — J811 Chronic pulmonary edema: Secondary | ICD-10-CM | POA: Diagnosis not present

## 2022-01-17 DIAGNOSIS — Z5982 Transportation insecurity: Secondary | ICD-10-CM

## 2022-01-17 DIAGNOSIS — Z820 Family history of epilepsy and other diseases of the nervous system: Secondary | ICD-10-CM

## 2022-01-17 DIAGNOSIS — F339 Major depressive disorder, recurrent, unspecified: Secondary | ICD-10-CM | POA: Diagnosis present

## 2022-01-17 LAB — SARS CORONAVIRUS 2 BY RT PCR: SARS Coronavirus 2 by RT PCR: NEGATIVE

## 2022-01-17 LAB — CBC WITH DIFFERENTIAL/PLATELET
Abs Immature Granulocytes: 0.03 10*3/uL (ref 0.00–0.07)
Basophils Absolute: 0 10*3/uL (ref 0.0–0.1)
Basophils Relative: 1 %
Eosinophils Absolute: 0.1 10*3/uL (ref 0.0–0.5)
Eosinophils Relative: 1 %
HCT: 39.3 % (ref 39.0–52.0)
Hemoglobin: 12.3 g/dL — ABNORMAL LOW (ref 13.0–17.0)
Immature Granulocytes: 1 %
Lymphocytes Relative: 13 %
Lymphs Abs: 0.8 10*3/uL (ref 0.7–4.0)
MCH: 36.1 pg — ABNORMAL HIGH (ref 26.0–34.0)
MCHC: 31.3 g/dL (ref 30.0–36.0)
MCV: 115.2 fL — ABNORMAL HIGH (ref 80.0–100.0)
Monocytes Absolute: 0.4 10*3/uL (ref 0.1–1.0)
Monocytes Relative: 7 %
Neutro Abs: 4.8 10*3/uL (ref 1.7–7.7)
Neutrophils Relative %: 77 %
Platelets: 187 10*3/uL (ref 150–400)
RBC: 3.41 MIL/uL — ABNORMAL LOW (ref 4.22–5.81)
RDW: 12.7 % (ref 11.5–15.5)
WBC: 6.1 10*3/uL (ref 4.0–10.5)
nRBC: 0 % (ref 0.0–0.2)

## 2022-01-17 LAB — BASIC METABOLIC PANEL
Anion gap: 13 (ref 5–15)
BUN: 25 mg/dL — ABNORMAL HIGH (ref 8–23)
CO2: 28 mmol/L (ref 22–32)
Calcium: 9.5 mg/dL (ref 8.9–10.3)
Chloride: 99 mmol/L (ref 98–111)
Creatinine, Ser: 1.3 mg/dL — ABNORMAL HIGH (ref 0.61–1.24)
GFR, Estimated: 56 mL/min — ABNORMAL LOW (ref >60)
Glucose, Bld: 108 mg/dL — ABNORMAL HIGH (ref 70–99)
Potassium: 4.7 mmol/L (ref 3.5–5.1)
Sodium: 140 mmol/L (ref 135–145)

## 2022-01-17 LAB — I-STAT ARTERIAL BLOOD GAS, ED
Acid-Base Excess: 5 mmol/L — ABNORMAL HIGH (ref 0.0–2.0)
Bicarbonate: 31.4 mmol/L — ABNORMAL HIGH (ref 20.0–28.0)
Calcium, Ion: 1.24 mmol/L (ref 1.15–1.40)
HCT: 39 % (ref 39.0–52.0)
Hemoglobin: 13.3 g/dL (ref 13.0–17.0)
O2 Saturation: 99 %
Patient temperature: 98.6
Potassium: 4.3 mmol/L (ref 3.5–5.1)
Sodium: 137 mmol/L (ref 135–145)
TCO2: 33 mmol/L — ABNORMAL HIGH (ref 22–32)
pCO2 arterial: 52.3 mmHg — ABNORMAL HIGH (ref 32–48)
pH, Arterial: 7.386 (ref 7.35–7.45)
pO2, Arterial: 152 mmHg — ABNORMAL HIGH (ref 83–108)

## 2022-01-17 LAB — TROPONIN I (HIGH SENSITIVITY)
Troponin I (High Sensitivity): 23 ng/L — ABNORMAL HIGH (ref <18)
Troponin I (High Sensitivity): 26 ng/L — ABNORMAL HIGH (ref <18)

## 2022-01-17 LAB — BRAIN NATRIURETIC PEPTIDE: B Natriuretic Peptide: 1222.6 pg/mL — ABNORMAL HIGH (ref 0.0–100.0)

## 2022-01-17 LAB — MAGNESIUM: Magnesium: 2.1 mg/dL (ref 1.7–2.4)

## 2022-01-17 MED ORDER — LOSARTAN POTASSIUM 25 MG PO TABS
12.5000 mg | ORAL_TABLET | Freq: Every day | ORAL | Status: DC
  Administered 2022-01-17 – 2022-01-18 (×2): 12.5 mg via ORAL
  Filled 2022-01-17: qty 0.5
  Filled 2022-01-17 (×2): qty 1

## 2022-01-17 MED ORDER — EMPAGLIFLOZIN 10 MG PO TABS
10.0000 mg | ORAL_TABLET | Freq: Every day | ORAL | Status: DC
  Administered 2022-01-18 – 2022-01-19 (×2): 10 mg via ORAL
  Filled 2022-01-17 (×2): qty 1

## 2022-01-17 MED ORDER — DULOXETINE HCL 60 MG PO CPEP
60.0000 mg | ORAL_CAPSULE | Freq: Every morning | ORAL | Status: DC
  Administered 2022-01-18 – 2022-01-19 (×2): 60 mg via ORAL
  Filled 2022-01-17 (×2): qty 1

## 2022-01-17 MED ORDER — SODIUM CHLORIDE 0.9% FLUSH
3.0000 mL | Freq: Two times a day (BID) | INTRAVENOUS | Status: DC
  Administered 2022-01-18 – 2022-01-19 (×3): 3 mL via INTRAVENOUS

## 2022-01-17 MED ORDER — QUETIAPINE FUMARATE ER 50 MG PO TB24
150.0000 mg | ORAL_TABLET | Freq: Every day | ORAL | Status: DC
  Administered 2022-01-17 – 2022-01-18 (×2): 150 mg via ORAL
  Filled 2022-01-17 (×3): qty 3

## 2022-01-17 MED ORDER — TRAZODONE HCL 100 MG PO TABS
100.0000 mg | ORAL_TABLET | Freq: Every day | ORAL | Status: DC
  Administered 2022-01-17 – 2022-01-18 (×2): 100 mg via ORAL
  Filled 2022-01-17 (×2): qty 1

## 2022-01-17 MED ORDER — MOMETASONE FURO-FORMOTEROL FUM 200-5 MCG/ACT IN AERO
2.0000 | INHALATION_SPRAY | Freq: Two times a day (BID) | RESPIRATORY_TRACT | Status: DC
  Administered 2022-01-17 – 2022-01-19 (×4): 2 via RESPIRATORY_TRACT
  Filled 2022-01-17: qty 8.8

## 2022-01-17 MED ORDER — APIXABAN 5 MG PO TABS
5.0000 mg | ORAL_TABLET | Freq: Two times a day (BID) | ORAL | Status: DC
  Administered 2022-01-17 – 2022-01-19 (×4): 5 mg via ORAL
  Filled 2022-01-17 (×4): qty 1

## 2022-01-17 MED ORDER — FUROSEMIDE 10 MG/ML IJ SOLN
40.0000 mg | Freq: Once | INTRAMUSCULAR | Status: AC
  Administered 2022-01-17: 40 mg via INTRAVENOUS
  Filled 2022-01-17: qty 4

## 2022-01-17 MED ORDER — ACETAMINOPHEN 650 MG RE SUPP: 650.0000 mg | Freq: Four times a day (QID) | RECTAL | Status: DC | PRN

## 2022-01-17 MED ORDER — SPIRONOLACTONE 12.5 MG HALF TABLET
12.5000 mg | ORAL_TABLET | Freq: Every day | ORAL | Status: DC
  Administered 2022-01-18 – 2022-01-19 (×2): 12.5 mg via ORAL
  Filled 2022-01-17 (×2): qty 1

## 2022-01-17 MED ORDER — POLYETHYLENE GLYCOL 3350 17 G PO PACK: 17.0000 g | PACK | Freq: Every day | ORAL | Status: DC | PRN

## 2022-01-17 MED ORDER — PRAZOSIN HCL 1 MG PO CAPS
1.0000 mg | ORAL_CAPSULE | Freq: Every day | ORAL | Status: DC
  Administered 2022-01-17 – 2022-01-18 (×2): 1 mg via ORAL
  Filled 2022-01-17 (×2): qty 1

## 2022-01-17 MED ORDER — UMECLIDINIUM BROMIDE 62.5 MCG/ACT IN AEPB
1.0000 | INHALATION_SPRAY | Freq: Every day | RESPIRATORY_TRACT | Status: DC
Start: 2022-01-18 — End: 2022-01-19
  Administered 2022-01-18 – 2022-01-19 (×2): 1 via RESPIRATORY_TRACT
  Filled 2022-01-17: qty 7

## 2022-01-17 MED ORDER — FUROSEMIDE 10 MG/ML IJ SOLN
40.0000 mg | Freq: Two times a day (BID) | INTRAMUSCULAR | Status: DC
  Administered 2022-01-18 – 2022-01-19 (×3): 40 mg via INTRAVENOUS
  Filled 2022-01-17 (×3): qty 4

## 2022-01-17 MED ORDER — AMIODARONE HCL 100 MG PO TABS
100.0000 mg | ORAL_TABLET | Freq: Every day | ORAL | Status: DC
  Administered 2022-01-18 – 2022-01-19 (×2): 100 mg via ORAL
  Filled 2022-01-17 (×2): qty 1

## 2022-01-17 MED ORDER — ACETAMINOPHEN 325 MG PO TABS
650.0000 mg | ORAL_TABLET | Freq: Four times a day (QID) | ORAL | Status: DC | PRN
  Administered 2022-01-18: 650 mg via ORAL
  Filled 2022-01-17: qty 2

## 2022-01-17 MED ORDER — TIOTROPIUM BROMIDE MONOHYDRATE 2.5 MCG/ACT IN AERS: 2.0000 | INHALATION_SPRAY | Freq: Every morning | RESPIRATORY_TRACT | Status: DC

## 2022-01-17 MED ORDER — ALBUTEROL SULFATE (2.5 MG/3ML) 0.083% IN NEBU: INHALATION_SOLUTION | Freq: Four times a day (QID) | RESPIRATORY_TRACT | Status: DC | PRN
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
## 2022-01-17 NOTE — ED Triage Notes (Signed)
Patient here for evaluation of ongoing shortness of breath that has been getting progressively worse over the last few days. Patient tachypneic maintaining SpO2 or 95% on 6L O2 [place]. History of CHF.

## 2022-01-17 NOTE — Progress Notes (Signed)
RT NOTE  Pt taken off BIPAP. Pt tolerating 6L [place] well at this time. MD at bedside. BIPAP PRN if needed.

## 2022-01-17 NOTE — ED Notes (Signed)
Respiratory at bedside to place patient on bipap

## 2022-01-17 NOTE — ED Notes (Signed)
Condom catheter applied to pt

## 2022-01-17 NOTE — ED Provider Triage Note (Signed)
Emergency Medicine Provider Triage Evaluation Note  John Ball , a 81 y.o. male  was evaluated in triage.  Pt complains of shortness of breath acutely worsening over the past several days.  Patient has a history of CHF.  Compliant with medications.  Typically wears 5 L of oxygen however patient's wife has had to increase oxygen levels.  Patient has associated chest pain.  Review of Systems  Positive: As per HPI Negative:   Physical Exam  BP 102/72   Pulse 62   Temp 98.3 F (36.8 C) (Oral)   Resp (!) 34   SpO2 95%  Gen:   Awake, no distress, patient on 6 L oxygen via nasal cannula Resp:  Normal effort, increased work of breathing, labored breathing. MSK:   Moves extremities without difficulty  Other:  No chest wall TTP.   Medical Decision Making  Medically screening exam initiated at 4:38 PM.  Appropriate orders placed.  [name] was informed that the remainder of the evaluation will be completed by another provider, this initial triage assessment does not replace that evaluation, and the importance of remaining in the ED until their evaluation is complete.  4:44 PM - Discussed with RN that patient is in need of a room immediately. RN aware and working on room placement.    Tylin Stradley A, PA-C 01/17/22 1644

## 2022-01-17 NOTE — H&P (Signed)
History and Physical   John Ball XVQ:[id number] DOB: Sep 29, 1940 DOA: 01/17/2022  PCP: Ronnell Freshwater, NP   Patient coming from: Home  Chief Complaint: Shortness of breath  HPI: John Ball is a 81 y.o. male with medical history significant of CAD status post CABG, hyperlipidemia, hypertension, CHF, bipolar disorder, depression, carcinoid tumor of the lung 2015, PAD, COPD, OSA, chronic respiratory failure, atrial fibrillation presenting with shortness of breath.  Patient reports 3 to 4 days of increasing shortness of breath.  Is chronically on 4 L home oxygen has had to increase this to 5 to 6 L.  Does report increased cough with frothy sputum production.  Reports some chronic myalgias for the last few months as well as some weight loss.  Denies fevers, chills, chest pain, abdominal pain, constipation, diarrhea, nausea, vomiting.  ED Course: Vital signs in the ED significant for heart rate in the 60-1 100s, respiratory rate initially in the 40s improved to the teens, initially on 6 L and then placed on BiPAP.  Lab work-up included BMP with BUN 25 and creatinine stable at 1.3 and glucose of 108.  CBC with hemoglobin stable at 12.3.  Troponin mildly elevated at 23 with repeat pending.  BNP elevated to 1222.  COVID screen pending.  ABG with normal pH and mildly elevated PCO2 at 52.3.  Chest x-ray showed cardiomegaly, central vascular congestion, bilateral airspace disease consistent with edema versus infection.  Patient received dose of Lasix IV in the ED and was placed on BiPAP.  Review of Systems: As per HPI otherwise all other systems reviewed and are negative.  Past Medical History:  Diagnosis Date   Anxiety    Arthritis    Complication of anesthesia    hx prolonged post op oxygen dependent/  hx hallucinations for 2 day post op lobectomy 2012   COPD, severe ([place])    Depression    Essential hypertension    Hearing loss of both ears    does wear his hearing aids   History of  arterial bypass of lower extremity    RIGHT FEM-POP   History of CHF (congestive heart failure)    History of DVT of lower extremity    History of panic attacks    Hyperlipidemia with target LDL less than 70    Left main coronary artery disease 2004   a) Severe LM CAD 2004 -->s/p  cabg x4 (LIMA-LAD, free RIMA-RI, SVG-OM 2, SVG-RPDA); b) 06/2014: Abnormal Myoview --> c)cardiac Cath Jan 2016: pLM 70%, pLAD 40-50%- patent LIMA-LAD.  RI -competitive flow noted from RIMA-RI graft, circumflex, normal caliber with moderate OM1.  OM 2 occluded.  SVG-OM 2 patent. 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    does not wear his dentures   Nocturia    OSA (obstructive sleep apnea)    uses O2  via [place]  --  intolerate to CPAP   Peripheral arterial occlusive disease ([place])    2011   a) Gore-Tex graft right AK popA-BK popA --> b) 2012: thrombosed graft --> thrombectomy with dacron patch = 1 V runnof via Peroneal; c) 01/2014: R femoropopliteal bypass with left femoral vein;; followed by dr Early--  per last dopplers 06-28-2014 no change right graft,  50-74% stenosis common and left mid superficial femoral artery   PTSD (post-traumatic stress disorder)    anxiety attack's---  [place] Vet   Recurrent productive cough  SMOKER'S COUGH   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--- a) 04/2011: R L Lobectomy & med node dissection ([id number]) - [facility] [place], [place]) w/o post-op Rx; b) CT-A Chest Jan 2013: R pl effusion, R hilar LAN (3.3 cm x 2.8 cm & 2.2 cm x 1.3 cm); c) 2/'13 PET-CT  Chest: Bilat Hilar LAN, no distant Mets  d) CT chest 9/'14: Stable shotty hilar nodes bilaterally w/ no pathologic sized LAD or suspicious Pulm nodule; ;;  (oncologist at   Wears glasses     Past Surgical History:  Procedure Laterality Date   ABDOMINAL AORTOGRAM N/A 11/12/2017   Procedure: ABDOMINAL AORTOGRAM;  Surgeon: Waynetta Sandy, MD;  Location: [facility];  Service: Cardiovascular;  Laterality: N/A;   ABDOMINAL AORTOGRAM W/LOWER EXTREMITY Left 02/10/2018   Procedure: ABDOMINAL AORTOGRAM W/LOWER EXTREMITY;  Surgeon: Serafina Mitchell, MD;  Location: [facility];  Service: Cardiovascular;  Laterality: Left;   APPLICATION OF WOUND VAC Left 12/03/2017   Procedure: EXCHANGE OF WOUND VAC LEFT GROIN,;  Surgeon: Rosetta Posner, MD;  [facility];  Service: Vascular;  Laterality: Left;   CARDIAC CATHETERIZATION  07/13/2002   Ischemia RCA region on Cardiolite// 60-70% ostial left main, 50% midCx, 60-70% mid RI,  70% mRCA, 90% JeNu and crux 75% RCA ,  95% PLA//  severe LM and 3Vessel CAD, perserved LV, ef 60%   COLONOSCOPY  2008    WNL   CORONARY ARTERY BYPASS GRAFT  07/27/2002   LIMA-LAD, freeRIMA-RI,SVG-OM2, SVG-rPDA; Dr. Roxan Hockey   CYSTOSCOPY W/ URETERAL STENT PLACEMENT N/A 02/21/2015   Procedure: BILATERAL RETROGRADE PYELOGRAM;  Surgeon: Festus Aloe, MD;  Location: [facility];  Service: Urology;  Laterality: N/A;   ENDARTERECTOMY FEMORAL Left 11/17/2017   Procedure: LEFT Femoral Endarterectomy with Patch Angioplasty;  Surgeon: Rosetta Posner, MD;  Location: [facility];  Service: Vascular;  Laterality: Left;   EYE SURGERY Left 05/2012   FEMORAL ARTERY - POPLITEAL ARTERY BYPASS GRAFT Right 10-24-2008   dr early   w/ GoreTex graft   FEMORAL-POPLITEAL BYPASS GRAFT Left 02/11/2018   Procedure: BYPASS GRAFT LOWER EXTREMITY;  Surgeon: Rosetta Posner, MD;  [facility];  Service: Vascular;  Laterality: Left;   FEMORAL-TIBIAL BYPASS GRAFT Right  01/10/2014   Procedure: Right Femoral to Posterior Tibial Bypass Graft using Left Leg Vein, Thrombectomy Right Common Femoral and Profunda of Leg . ;  Surgeon: Rosetta Posner, MD;  Location: [facility];  Service: Vascular;  Laterality: Right;   HYDROCELE EXCISION Left    LEFT ANKLE SURGERY  1990's   LEFT HEART CATHETERIZATION WITH CORONARY/GRAFT ANGIOGRAM N/A 07/12/2014   Procedure: LEFT HEART CATHETERIZATION WITH Beatrix Fetters;  Surgeon: Troy Sine, MD;  Location: [facility];  Service: Cardiovascular;  Laterality: N/A;  pLM 70%, pLAD 40-50%- patent LIMA-LAD.  RI -competitive flow noted from fRIMA-RI graft, circumflex, normal caliber with moderate OM1.  OM 2 occluded.  SVG-OM 2 patent. RCA CTO after large RVM, faint R-R and LAD septal-PDA collateral   LOWER EXTREMITY ANGIOGRAPHY Bilateral 11/12/2017   Procedure:  LOWER EXTREMITY ANGIOGRAPHY - Left Lower;  Surgeon: Waynetta Sandy, MD;  Location: [facility];  Service: Cardiovascular;  Laterality: Bilateral;   NM MYOVIEW LTD  11/'15; 3/'19, 5/'20   a) Mod sized, mod intensity/partially reversible inferior defect c/w  prior infarct mild/mod peri-infarct ischemia.  Inferoseptal HK.  INTERMEDIATE RISK. --> Cath showed CTO of Native RCA & SVG-rPDA.; b) EF ~42%. Inferior-Inferoseptal defect - likely old scar - INTERMEDIATE RISK b/c low EF (Echo EF 50-55%) - med Rx; c) [place] -by report - Non-ischemic.   PERIPHERAL VASCULAR INTERVENTION Left 02/10/2018   Procedure: PERIPHERAL VASCULAR INTERVENTION;  Surgeon: Serafina Mitchell, MD;  Location: [facility];  Service: Cardiovascular;  Laterality: Left;  left common iliac stent, attempted left SFA stent   RIGHT ABOVE KNEE POPLITEAL GRAFT TO BELOW KNEE POPLITEAL ARTERY BYPASS WITH REVERSE SAPHENOUS VEIN  03/02/2010   RIGHT/LEFT HEART CATH AND CORONARY/GRAFT ANGIOGRAPHY N/A 09/12/2021   Procedure: RIGHT/LEFT HEART CATH AND CORONARY/GRAFT ANGIOGRAPHY;  Surgeon: Larey Dresser,  MD;  Location: [facility];  Service: Cardiovascular;  Laterality: N/A;   THROMBECTOMY OF RIGHT FEMORAL TO ABOVE KNEE POPLITEAL GORETEX GRAFT ANGIOPLASTY OF GORETEX AND SAPHENOUS VEIN JUNCTION AND ABOVE KNEE POPLITEAL ARTERY   10-06-2010   DR EARLY   TONSILLECTOMY     TRANSTHORACIC ECHOCARDIOGRAM  09/2017   EF 50-55%.  More accurate - Mild inferolateral HK (c/w known RCA occlusion).  GR 1 DD.  Mild LA dilation mildly increased PA pressures with mild RV/RA dilation.   TRANSURETHRAL RESECTION OF BLADDER TUMOR N/A 02/21/2015   Procedure: TRANSURETHRAL RESECTION OF BLADDER TUMOR (TURBT);  Surgeon: Festus Aloe, MD;  Location: [facility];  Service: Urology;  Laterality: N/A;   VIDEO ASSISTED THORACOSCOPY (VATS)/WEDGE RESECTION Right 05-01-2011    [place]   LOWER LOBECTOMY W/  NODE DISSECTION   WOUND EXPLORATION Left 11/28/2017   Procedure: WOUND EXPLORATION GROIN, WASHOUT AND WOUND VAC APPLICATION;  Surgeon: Conrad Weston, MD;  Location: [facility];  Service: Vascular;  Laterality: Left;    Social History  reports that he has been smoking cigarettes. He started smoking about 64 years ago. He has a 60.00 pack-year smoking history. He has never used smokeless tobacco. He reports that he does not drink alcohol and does not use drugs.  Allergies  Allergen Reactions   Aspirin Other (See Comments)    Avoids due to severe bruises   Crestor [Rosuvastatin Calcium] Other (See Comments)    myalgia   Lipitor [Atorvastatin Calcium] Other (See Comments)    myalgia   Zetia [Ezetimibe] Other (See Comments)    myalgia   Zocor [Simvastatin - High Dose] Other (See Comments)    myalgia   Atorvastatin Itching    Other reaction(s): Joint swelling Other reaction(s): Joint swelling   Atorvastatin Calcium    Aripiprazole Other (See Comments)    Altered mental status   Effexor [Venlafaxine Hydrochloride] Itching   Morphine And Related Itching    Family History  Problem Relation Age of  Onset   Other Mother 43       Abdominal Aortic Aneurysm    Stroke Mother    Alzheimer's disease Father 60   Stroke Father   Reviewed on admission  Prior to Admission medications   Medication Sig Start Date End Date Taking? Authorizing Provider  acetaminophen (TYLENOL) 500 MG tablet Take 1,000 mg by mouth every 6 (six) hours as needed for moderate pain or headache.     [provider]  albuterol (PROVENTIL HFA;VENTOLIN HFA) 108 (90 BASE) MCG/ACT inhaler Inhale 1 puff into the lungs every 6 (six) hours as needed for wheezing or shortness of breath.    [provider]  albuterol (PROVENTIL) (2.5 MG/3ML) 0.083% nebulizer solution Take 2.5 mg by nebulization every 6 (six) hours as needed for wheezing or shortness of breath. 03/15/18   [provider]  amiodarone (PACERONE) 200 MG tablet Take 0.5 tablets (100 mg total) by mouth daily. 11/09/21   [place], Maricela Bo, FNP  apixaban (ELIQUIS) 5 MG TABS tablet Take 1 tablet (5 mg total) by mouth 2 (two) times daily. 09/18/21   Larey Dresser, MD  cholecalciferol (VITAMIN D) 25 MCG (1000 UNIT) tablet Take 1,000 Units by mouth in the morning. 04/20/21   [provider]  CYMBALTA 60 MG capsule Take 60 mg by mouth in the morning. 10/31/19   [provider]  empagliflozin (JARDIANCE) 10 MG TABS tablet Take 1 tablet (10 mg total) by mouth daily. 09/18/21   Larey Dresser, MD  losartan (COZAAR) 25 MG tablet Take 0.5 tablets (12.5 mg total) by mouth at bedtime. 11/21/21   [place], Maricela Bo, FNP  nitroGLYCERIN (NITROSTAT) 0.4 MG SL tablet Place 1 tablet (0.4 mg total) under the tongue every 5 (five) minutes as needed for chest pain. 08/28/17   Leonie Man, MD  OXYGEN Inhale 5 L into the lungs continuous. Uses 4L at bedtime and if needed throughout the day -- intolerant to CPAP    [provider]  prazosin (MINIPRESS) 1 MG capsule Take 1 mg by mouth at bedtime.    [provider]  QUEtiapine Fumarate  (SEROQUEL XR) 150 MG 24 hr tablet Take 150 mg by mouth at bedtime.    [provider]  spironolactone (ALDACTONE) 25 MG tablet Take 0.5 tablets (12.5 mg total) by mouth daily. 11/09/21   [place], Maricela Bo, FNP  Tiotropium Bromide Monohydrate 2.5 MCG/ACT AERS Inhale 2 puffs into the lungs in the morning.    [provider]  traZODone (DESYREL) 100 MG tablet Take 100 mg by mouth at bedtime.    [provider]  Grant Ruts INHUB 250-50 MCG/ACT AEPB Inhale 1 puff into the lungs 2 (two) times daily. 07/25/21   [provider]    Physical Exam: Vitals:   01/17/22 1639 01/17/22 1715 01/17/22 1746 01/17/22 1815  BP:  127/78  (!) 127/108  Pulse:  (!) 113 (!) 108 72  Resp: (!) 40 19  20  Temp:      TempSrc:      SpO2:  91%  91%    Physical Exam Constitutional:      General: He is not in acute distress.    Appearance: Normal appearance.  HENT:     Head: Normocephalic and atraumatic.     Mouth/Throat:     Mouth: Mucous membranes are moist.     Pharynx: Oropharynx is clear.  Eyes:     Extraocular Movements: Extraocular movements intact.     Pupils: Pupils are equal, round, and reactive to light.  Cardiovascular:     Rate and Rhythm: Regular rhythm. Tachycardia present.     Pulses: Normal pulses.     Heart sounds: Normal heart sounds.  Pulmonary:     Effort: Pulmonary effort is normal. No respiratory distress.     Comments: Distant breath sounds Abdominal:     General: Bowel sounds are normal. There is no distension.     Palpations: Abdomen is  soft.     Tenderness: There is no abdominal tenderness.  Musculoskeletal:        General: No swelling or deformity.  Skin:    General: Skin is warm and dry.  Neurological:     General: No focal deficit present.     Mental Status: Mental status is at baseline.    Labs on Admission: I have personally reviewed following labs and imaging studies  CBC: Recent Labs  Lab 01/17/22 1644 01/17/22 1831  WBC 6.1  --    NEUTROABS 4.8  --   HGB 12.3* 13.3  HCT 39.3 39.0  MCV 115.2*  --   PLT 187  --     Basic Metabolic Panel: Recent Labs  Lab 01/17/22 1644 01/17/22 1831  NA 140 137  K 4.7 4.3  CL 99  --   CO2 28  --   GLUCOSE 108*  --   BUN 25*  --   CREATININE 1.30*  --   CALCIUM 9.5  --     GFR: CrCl cannot be calculated (Unknown ideal weight.).  Liver Function Tests: No results for input(s): "AST", "ALT", "ALKPHOS", "BILITOT", "PROT", "ALBUMIN" in the last 168 hours.  Urine analysis:    Component Value Date/Time   COLORURINE YELLOW 11/12/2017 1344   APPEARANCEUR CLEAR 11/12/2017 1344   LABSPEC >1.046 (H) 11/12/2017 1344   PHURINE 7.0 11/12/2017 1344   GLUCOSEU NEGATIVE 11/12/2017 1344   HGBUR NEGATIVE 11/12/2017 1344   BILIRUBINUR NEGATIVE 11/12/2017 1344   KETONESUR NEGATIVE 11/12/2017 1344   PROTEINUR NEGATIVE 11/12/2017 1344   UROBILINOGEN 0.2 02/23/2010 1546   NITRITE NEGATIVE 11/12/2017 1344   LEUKOCYTESUR NEGATIVE 11/12/2017 1344    Radiological Exams on Admission: DG Chest Port 1 View  Result Date: 01/17/2022 CLINICAL DATA:  Shortness of breath. EXAM: PORTABLE CHEST 1 VIEW COMPARISON:  Chest x-ray 09/14/2021 FINDINGS: Patient is status post cardiac surgery. The cardiac silhouette is mildly enlarged, unchanged. PICC line has been removed. There is central pulmonary vascular congestion. There is some patchy airspace opacities in both lung bases. Again seen is elevation of the right hemidiaphragm. Small right pleural effusion is likely present. Small left pleural effusion is present. There is no evidence for pneumothorax or acute fracture. IMPRESSION: 1. Cardiomegaly with central pulmonary vascular congestion and small pleural effusions. 2. Bilateral airspace opacities may represent infection or edema. Follow-up chest x-ray recommended in 4-6 weeks to confirm resolution. Electronically Signed   By: Ronney Asters M.D.   On: 01/17/2022 17:30    EKG: Independently reviewed.   Sinus tachycardia at 107 bpm.  PACs noted.  Nonspecific T wave changes.  Similar to previous.  The  Assessment/Plan Principal Problem:   Acute on chronic respiratory failure with hypoxia (HCC) Active Problems:   Malignant carcinoid tumor of lung (HCC)   Coronary arteriosclerosis   S/P CABG x 4   Peripheral arterial occlusive disease (HCC)   Hyperlipidemia with target LDL less than 70   Essential hypertension   Chronic obstructive pulmonary disease (HCC)   Chronic diastolic heart failure (HCC)   Recurrent major depression (HCC)   Bipolar I disorder (HCC)   OSA (obstructive sleep apnea)   PAD (peripheral artery disease) (HCC)   Unspecified atrial fibrillation (HCC)   Acute on chronic combined systolic and diastolic CHF (congestive heart failure) (HCC)   Acute on chronic respiratory failure with hypoxia Acute on chronic combined systolic and diastolic heart failure > Last echo in 5 to 6 months ago with EF 20-25%, grade 2  diastolic dysfunction, severely reduced RV function. > Patient presenting with increasing shortness of breath having to increase chronic oxygen from 4 L to 5 to 6 L to maintain saturations.  Also placed on BiPAP in the ED due to tachypnea and mildly increased PCO2. > Patient's respiratory failure due primarily to CHF exacerbation with BNP elevated to greater than 1200.  Troponin mildly elevated at 23 with repeat pending.  Chest x-ray showing cardiomegaly and vascular congestion with evidence of edema as well. > Patient likely has poor reserve considering he also has history of COPD, OSA, chronic respiratory failure. - Monitor on progressive unit - Continue with BiPAP for now, plan for trial off - Wean oxygen as tolerated - Lasix 40 mg IV twice daily - Daily weights, strict I's and O's - Check magnesium - Trend renal function and electrolytes - Echocardiogram  COPD - Replace home [place] with formulary [place] - Continue home tiotropium - As needed  albuterol  Hyperlipidemia PAD CAD status post CABG x4 with 2 bypass grafts occluded - Continue home losartan, Eliquis  Atrial fibrillation - Continue home Eliquis, amiodarone  Bipolar disorder Depression - Continue home Cymbalta, Seroquel, trazodone  OSA - Currently on BiPAP  History of carcinoid tumor of the lung > Patient also reporting weight loss and some fatigue recently. - May need to get CT scan for further evaluation, especially if respiratory status is not improving  DVT prophylaxis: Eliquis Code Status:   Full Family Communication:  Wife updated by phone  Disposition Plan:   Patient is from:  Home  Anticipated DC to:  Home  Anticipated DC date:  1 to 4 days  Anticipated DC barriers: None  Consults called:  None Admission status:  Observation, progressive  Severity of Illness: The appropriate patient status for this patient is OBSERVATION. Observation status is judged to be reasonable and necessary in order to provide the required intensity of service to ensure the patient's safety. The patient's presenting symptoms, physical exam findings, and initial radiographic and laboratory data in the context of their medical condition is felt to place them at decreased risk for further clinical deterioration. Furthermore, it is anticipated that the patient will be medically stable for discharge from the hospital within 2 midnights of admission.    Marcelyn Bruins MD [facility]  How to contact the [facility] Attending or Consulting provider [place] or covering provider during after hours [place], for this patient?   Check the care team in [facility] and look for a) attending/consulting TR[facility] provider listed and b) the [facility] team listed Log into [email or web address] and use [place]'s universal password to access. If you do not have the password, please contact the hospital operator. Locate the [facility] provider you are looking for under [facility] and page to a number that you can be  directly reached. If you still have difficulty reaching the provider, please page the [facility] (Director on Call) for the Hospitalists listed on amion for assistance.  01/17/2022, 7:26 PM

## 2022-01-18 ENCOUNTER — Observation Stay (HOSPITAL_COMMUNITY): Payer: Medicare Other

## 2022-01-18 DIAGNOSIS — J449 Chronic obstructive pulmonary disease, unspecified: Secondary | ICD-10-CM | POA: Diagnosis present

## 2022-01-18 DIAGNOSIS — I2510 Atherosclerotic heart disease of native coronary artery without angina pectoris: Secondary | ICD-10-CM | POA: Diagnosis present

## 2022-01-18 DIAGNOSIS — Z888 Allergy status to other drugs, medicaments and biological substances status: Secondary | ICD-10-CM | POA: Diagnosis not present

## 2022-01-18 DIAGNOSIS — J9621 Acute and chronic respiratory failure with hypoxia: Secondary | ICD-10-CM | POA: Diagnosis present

## 2022-01-18 DIAGNOSIS — I110 Hypertensive heart disease with heart failure: Secondary | ICD-10-CM | POA: Diagnosis present

## 2022-01-18 DIAGNOSIS — C7A090 Malignant carcinoid tumor of the bronchus and lung: Secondary | ICD-10-CM

## 2022-01-18 DIAGNOSIS — Z7951 Long term (current) use of inhaled steroids: Secondary | ICD-10-CM | POA: Diagnosis not present

## 2022-01-18 DIAGNOSIS — G629 Polyneuropathy, unspecified: Secondary | ICD-10-CM | POA: Diagnosis present

## 2022-01-18 DIAGNOSIS — Z7984 Long term (current) use of oral hypoglycemic drugs: Secondary | ICD-10-CM | POA: Diagnosis not present

## 2022-01-18 DIAGNOSIS — Z20822 Contact with and (suspected) exposure to covid-19: Secondary | ICD-10-CM | POA: Diagnosis present

## 2022-01-18 DIAGNOSIS — I779 Disorder of arteries and arterioles, unspecified: Secondary | ICD-10-CM | POA: Diagnosis not present

## 2022-01-18 DIAGNOSIS — I739 Peripheral vascular disease, unspecified: Secondary | ICD-10-CM | POA: Diagnosis present

## 2022-01-18 DIAGNOSIS — R0609 Other forms of dyspnea: Secondary | ICD-10-CM

## 2022-01-18 DIAGNOSIS — I10 Essential (primary) hypertension: Secondary | ICD-10-CM | POA: Diagnosis not present

## 2022-01-18 DIAGNOSIS — Z7901 Long term (current) use of anticoagulants: Secondary | ICD-10-CM | POA: Diagnosis not present

## 2022-01-18 DIAGNOSIS — F319 Bipolar disorder, unspecified: Secondary | ICD-10-CM | POA: Diagnosis present

## 2022-01-18 DIAGNOSIS — I5043 Acute on chronic combined systolic (congestive) and diastolic (congestive) heart failure: Secondary | ICD-10-CM | POA: Diagnosis present

## 2022-01-18 DIAGNOSIS — G4733 Obstructive sleep apnea (adult) (pediatric): Secondary | ICD-10-CM | POA: Diagnosis not present

## 2022-01-18 DIAGNOSIS — I482 Chronic atrial fibrillation, unspecified: Secondary | ICD-10-CM | POA: Diagnosis present

## 2022-01-18 DIAGNOSIS — Z9981 Dependence on supplemental oxygen: Secondary | ICD-10-CM | POA: Diagnosis not present

## 2022-01-18 DIAGNOSIS — I255 Ischemic cardiomyopathy: Secondary | ICD-10-CM | POA: Diagnosis present

## 2022-01-18 DIAGNOSIS — J9622 Acute and chronic respiratory failure with hypercapnia: Secondary | ICD-10-CM | POA: Diagnosis present

## 2022-01-18 DIAGNOSIS — Z79899 Other long term (current) drug therapy: Secondary | ICD-10-CM | POA: Diagnosis not present

## 2022-01-18 DIAGNOSIS — Z85118 Personal history of other malignant neoplasm of bronchus and lung: Secondary | ICD-10-CM | POA: Diagnosis not present

## 2022-01-18 DIAGNOSIS — F17210 Nicotine dependence, cigarettes, uncomplicated: Secondary | ICD-10-CM | POA: Diagnosis present

## 2022-01-18 DIAGNOSIS — Z951 Presence of aortocoronary bypass graft: Secondary | ICD-10-CM | POA: Diagnosis not present

## 2022-01-18 DIAGNOSIS — I5023 Acute on chronic systolic (congestive) heart failure: Secondary | ICD-10-CM | POA: Diagnosis not present

## 2022-01-18 DIAGNOSIS — R0602 Shortness of breath: Secondary | ICD-10-CM | POA: Diagnosis present

## 2022-01-18 DIAGNOSIS — Z886 Allergy status to analgesic agent status: Secondary | ICD-10-CM | POA: Diagnosis not present

## 2022-01-18 DIAGNOSIS — Z885 Allergy status to narcotic agent status: Secondary | ICD-10-CM | POA: Diagnosis not present

## 2022-01-18 DIAGNOSIS — E785 Hyperlipidemia, unspecified: Secondary | ICD-10-CM | POA: Diagnosis present

## 2022-01-18 LAB — COMPREHENSIVE METABOLIC PANEL
ALT: 15 U/L (ref 0–44)
AST: 20 U/L (ref 15–41)
Albumin: 3.8 g/dL (ref 3.5–5.0)
Alkaline Phosphatase: 47 U/L (ref 38–126)
Anion gap: 8 (ref 5–15)
BUN: 24 mg/dL — ABNORMAL HIGH (ref 8–23)
CO2: 29 mmol/L (ref 22–32)
Calcium: 9.2 mg/dL (ref 8.9–10.3)
Chloride: 101 mmol/L (ref 98–111)
Creatinine, Ser: 1.31 mg/dL — ABNORMAL HIGH (ref 0.61–1.24)
GFR, Estimated: 56 mL/min — ABNORMAL LOW (ref >60)
Glucose, Bld: 98 mg/dL (ref 70–99)
Potassium: 4.2 mmol/L (ref 3.5–5.1)
Sodium: 138 mmol/L (ref 135–145)
Total Bilirubin: 0.5 mg/dL (ref 0.3–1.2)
Total Protein: 6.2 g/dL — ABNORMAL LOW (ref 6.5–8.1)

## 2022-01-18 LAB — ECHOCARDIOGRAM COMPLETE
Area-P 1/2: 4.65 cm2
Calc EF: 22.2 %
Height: 72 in
MV M vel: 4.24 m/s
MV Peak grad: 71.8 mmHg
Radius: 0.2 cm
S' Lateral: 6.1 cm
Single Plane A2C EF: 15.5 %
Single Plane A4C EF: 31 %
Weight: 2391.55 oz

## 2022-01-18 LAB — CBC
HCT: 35.7 % — ABNORMAL LOW (ref 39.0–52.0)
Hemoglobin: 11.5 g/dL — ABNORMAL LOW (ref 13.0–17.0)
MCH: 36.1 pg — ABNORMAL HIGH (ref 26.0–34.0)
MCHC: 32.2 g/dL (ref 30.0–36.0)
MCV: 111.9 fL — ABNORMAL HIGH (ref 80.0–100.0)
Platelets: 149 10*3/uL — ABNORMAL LOW (ref 150–400)
RBC: 3.19 MIL/uL — ABNORMAL LOW (ref 4.22–5.81)
RDW: 12.8 % (ref 11.5–15.5)
WBC: 4.6 10*3/uL (ref 4.0–10.5)
nRBC: 0 % (ref 0.0–0.2)

## 2022-01-18 MED ORDER — NICOTINE 21 MG/24HR TD PT24
21.0000 mg | MEDICATED_PATCH | Freq: Every day | TRANSDERMAL | Status: DC
  Administered 2022-01-18 – 2022-01-19 (×2): 21 mg via TRANSDERMAL
  Filled 2022-01-18 (×2): qty 1

## 2022-01-18 MED ORDER — CLONAZEPAM 0.25 MG PO TBDP
0.2500 mg | ORAL_TABLET | Freq: Every day | ORAL | Status: DC
  Administered 2022-01-18: 0.25 mg via ORAL
  Filled 2022-01-18: qty 1

## 2022-01-18 NOTE — Assessment & Plan Note (Addendum)
No clinical signs of exacerbation.  Continue bronchodilator therapy and supplemental 02 per [place]. He has C02 retention consistent with chronic respiratory failure, hypercapnic.  Out of bed to chair tid with meals, PT and Ot. \  Tobacco abuse Smoking cessation counseling, added nicotine patch.

## 2022-01-18 NOTE — Consult Note (Signed)
[facility] Inpatient Consult   01/18/2022  RAYBURN MUNDIS 01-04-1941 [id number]  [place] [facility] [ACO] Patient: Medicare ACO REACH  Primary Care Provider:  Ronnell Freshwater, NP, [facility]:  Discussed in progression meeting currently observation status 11:28 am met with patient and wife just arriving to room and explained reason for rounding with [place] Management.  Currently, denies any new needs for post hospital transition. Wife endorses PCP noted above she also states they follow up at the [place].  Left an appointment reminder card at the bedside.  Review of patient's medical record for post hospital transition needs.  Plan:  Continue to follow progress and disposition to assess for post hospital care management needs.    For questions contact:   Natividad Brood, RN BSN [facility] Liaison  [phone] business mobile phone Toll free office [phone]  Fax number: [phone] [email or web address]

## 2022-01-18 NOTE — Hospital Course (Addendum)
John Ball was admitted to the hospital with the working diagnosis of decompensated heart failure.   81 yo male with the past medical history of coronary artery disease, dyslipidemia, hypertension, heart failure, atrial fibrillation and COPD. Reported 3 to 4 days of worsening dyspnea with increase 02 requirements to 5 and 6 L per min from 4 L per min. Positive weight gain 5 lbs in 2 weeks. Patient at home with increase po intake, likely salt included.  On his initial physical examination his blood pressure was 127/78, HR 113 and RR 40 with 02 saturation 91%, heart with S1 and S2 present with no gallops, lungs with distant breath sounds, abdomen with no distention and positive lower extremity edema.   Na 140, K 4,7 CL 99, bicarbonate 28, glucose 108 bun 25 and cr 1,30  BNP 1,222 High sensitive troponin 23  Wbc 6,1 hgb 12,3 plt 187  Sars covid 19 negative   Chest radiograph with cardiomegaly, loculated right pleural effusion, small pleural effusion on the left, bilateral interstitial infiltrates more on the right than left.   EKG 107 bpm with right axis deviation, normal intervals, sinus rhythm with PAC, no significant ST segment or T wave changes.   Patient was placed on IV furosemide with improvement in volume status. He will continue diuresis at home with oral furosemide with instructions to increase the dose in case of volume overload. Follow up with primary care as outpatient.

## 2022-01-18 NOTE — Progress Notes (Signed)
  Echocardiogram 2D Echocardiogram has been performed.  [name] 01/18/2022, 10:48 AM

## 2022-01-18 NOTE — Assessment & Plan Note (Signed)
Continue rate control with amiodarone and anticoagulation with apixaban. Continue telemetry monitoring.  

## 2022-01-18 NOTE — Assessment & Plan Note (Addendum)
Continue with seroquel, duloxetine and trazodone. In the past patient has been on clonazepam but discontinue due to risk of respiratory compromise.  His wife would like to resume lower dose due to uncontrolled anxiety at night  Will add 0,25 mg clonazepam at night with close monitoring.

## 2022-01-18 NOTE — Assessment & Plan Note (Addendum)
Follow up echocardiogram with mild improvement in LV systolic function to 30 to 35%, global hypokinesis, RV systolic function with mild reduction. Mild to moderate MR.   Documented urine output is 1,708 with improvement in his symptoms.  Systolic blood pressure 814 to 100 mmHg.   Plan to continue diuresis with furosemide 40 mg IV q12 hrs. Losartan and spironolactone . On SGLT 2 inh.   Acute on chronic hypoxemic respiratory failure due to acute cardiogenic pulmonary edema and bilateral pleural effusion.   02 saturation today is 99% on 4 L/min per [place].  Continue diuresis with furosemide

## 2022-01-18 NOTE — Progress Notes (Signed)
Progress Note   Patient: John Ball LKG:[id number] DOB: 1941/05/09 DOA: 01/17/2022     0 DOS: the patient was seen and examined on 01/18/2022   Brief hospital course: John Ball was admitted to the hospital with the working diagnosis of decompensated heart failure.   81 yo male with the past medical history of coronary artery disease, dyslipidemia, hypertension, heart failure, atrial fibrillation and COPD. Reported 3 to 4 days of worsening dyspnea with increase 02 requirements to 5 and 6 L per min from 4 L per min. On his initial physical examination his blood pressure was 127/78, HR 113 and RR 40 with 02 saturation 91%, heart with S1 and S2 present with no gallops, lungs with distant breath sounds, abdomen with no distention and positive lower extremity edema.   Na 140, K 4,7 CL 99, bicarbonate 28, glucose 108 bun 25 and cr 1,30  BNP 1,222 High sensitive troponin 23  Wbc 6,1 hgb 12,3 plt 187  Sars covid 19 negative   Chest radiograph with cardiomegaly, loculated right pleural effusion, small pleural effusion on the left, bilateral interstitial infiltrates more on the right than left.   EKG 107 bpm with right axis deviation, normal intervals, sinus rhythm with PAC, no significant ST segment or T wave changes.   Assessment and Plan: Acute on chronic combined systolic and diastolic CHF (congestive heart failure) (HCC) Follow up echocardiogram with mild improvement in LV systolic function to 30 to 35%, global hypokinesis, RV systolic function with mild reduction. Mild to moderate MR.   Documented urine output is 1,708 with improvement in his symptoms.  Systolic blood pressure 664 to 100 mmHg.   Plan to continue diuresis with furosemide 40 mg IV q12 hrs. Losartan and spironolactone . On SGLT 2 inh.   Acute on chronic hypoxemic respiratory failure due to acute cardiogenic pulmonary edema and bilateral pleural effusion.   02 saturation today is 99% on 4 L/min per [name].  Continue  diuresis with furosemide  Chronic obstructive pulmonary disease (HCC) No clinical signs of exacerbation.  Continue bronchodilator therapy and supplemental 02 per [name]. He has C02 retention consistent with chronic respiratory failure, hypercapnic.  Out of bed to chair tid with meals, PT and Ot. \  Tobacco abuse Smoking cessation counseling, added nicotine patch.   Essential hypertension Continue blood pressure control with losartan.  Continue diuresis with furosemide.   Atrial fibrillation, chronic (HCC) Continue rate control with amiodarone and anticoagulation with apixaban.  Continue telemetry monitoring.   Hyperlipidemia with target LDL less than 70 Continue with statin therapy.   Bipolar I disorder ([name]) Continue with seroquel, duloxetine and trazodone. In the past patient has been on clonazepam but discontinue due to risk of respiratory compromise.  His wife would like to resume lower dose due to uncontrolled anxiety at night  Will add 0,25 mg clonazepam at night with close monitoring.         Subjective: Patient with improvement in dyspnea but not back to baseline, no chest pain.   Physical Exam: Vitals:   01/18/22 0300 01/18/22 0400 01/18/22 0745 01/18/22 1128  BP:  115/77 112/80 100/65  Pulse:  100  87  Resp:  19 17 16   Temp:  97.8 F (36.6 C) 98 F (36.7 C) 98.4 F (36.9 C)  TempSrc:  Oral Oral Oral  SpO2: 100% 99% 99% 100%  Weight:  67.8 kg    Height:       Neurology awake and alert ENT with no pallor Cardiovascular with S1  and S2 present irregularly irregular with no gallops, rubs or murmurs Respiratory with scattered rales but not wheezing Abdomen not distended [place] lower extremity edema  Data Reviewed:   Family Communication: I spoke with patient's wife at the bedside, we talked in detail about patient's condition, plan of care and prognosis and all questions were addressed.   Disposition: Status is: inpatient   Planned Discharge  Destination: Home    Author: Tawni Millers, MD 01/18/2022 2:15 PM  For on call review [email or web address].

## 2022-01-18 NOTE — TOC Progression Note (Addendum)
Transition of Care [facility]) - Progression Note    Patient Details  Name: EVON LOPEZPEREZ MRN: [id number] Date of Birth: 1940-08-03  Transition of Care [facility]) CM/SW Contact  Zenon Mayo, RN Phone Number: 01/18/2022, 12:06 PM  Clinical Narrative:    Patient goes to [facility], PCP is [facility] fax number is [phone], CSW is [employer] [phone].  [place] notification ID is [id number].  [facility] patient, [facility], indep, from home alone, confused today dyspneic on exertion, conts on iv lasix, uses 4 liters at home. TOC following.        Expected Discharge Plan and Services                                                 Social Determinants of Health (SDOH) Interventions    Readmission Risk Interventions    09/17/2021    2:35 PM  Readmission Risk Prevention Plan  Transportation Screening Complete  PCP or Specialist Appt within 3-5 Days Complete  HRI or [place] Complete  Social Work Consult for [place] Planning/Counseling Complete  Palliative Care Screening Complete  Medication Review [employer]) Complete

## 2022-01-18 NOTE — Assessment & Plan Note (Signed)
Continue blood pressure control with losartan.  Continue diuresis with furosemide.

## 2022-01-18 NOTE — Assessment & Plan Note (Signed)
Continue with statin therapy.  ?

## 2022-01-19 DIAGNOSIS — I5023 Acute on chronic systolic (congestive) heart failure: Secondary | ICD-10-CM

## 2022-01-19 DIAGNOSIS — F319 Bipolar disorder, unspecified: Secondary | ICD-10-CM | POA: Diagnosis not present

## 2022-01-19 DIAGNOSIS — I482 Chronic atrial fibrillation, unspecified: Secondary | ICD-10-CM | POA: Diagnosis not present

## 2022-01-19 DIAGNOSIS — I5043 Acute on chronic combined systolic (congestive) and diastolic (congestive) heart failure: Secondary | ICD-10-CM | POA: Diagnosis not present

## 2022-01-19 LAB — BASIC METABOLIC PANEL
Anion gap: 9 (ref 5–15)
BUN: 21 mg/dL (ref 8–23)
CO2: 35 mmol/L — ABNORMAL HIGH (ref 22–32)
Calcium: 9.1 mg/dL (ref 8.9–10.3)
Chloride: 95 mmol/L — ABNORMAL LOW (ref 98–111)
Creatinine, Ser: 1.16 mg/dL (ref 0.61–1.24)
GFR, Estimated: >60 mL/min (ref >60)
Glucose, Bld: 96 mg/dL (ref 70–99)
Potassium: 3.5 mmol/L (ref 3.5–5.1)
Sodium: 139 mmol/L (ref 135–145)

## 2022-01-19 MED ORDER — FUROSEMIDE 20 MG PO TABS: 20.0000 mg | ORAL_TABLET | Freq: Every day | ORAL | 0 refills | Status: AC

## 2022-01-19 MED ORDER — POTASSIUM CHLORIDE CRYS ER 10 MEQ PO TBCR
10.0000 meq | EXTENDED_RELEASE_TABLET | Freq: Every day | ORAL | Status: DC
Start: 2022-01-19 — End: 2022-01-19

## 2022-01-19 MED ORDER — FUROSEMIDE 20 MG PO TABS: 20.0000 mg | ORAL_TABLET | Freq: Every day | ORAL | Status: DC

## 2022-01-19 MED ORDER — POTASSIUM CHLORIDE CRYS ER 10 MEQ PO TBCR
10.0000 meq | EXTENDED_RELEASE_TABLET | Freq: Every day | ORAL | 0 refills | Status: AC
Start: 2022-01-19 — End: 2022-02-18

## 2022-01-19 MED ORDER — FUROSEMIDE 40 MG PO TABS: 40.0000 mg | ORAL_TABLET | Freq: Every day | ORAL | Status: DC

## 2022-01-19 MED ORDER — CLONAZEPAM 0.25 MG PO TBDP: 0.2500 mg | ORAL_TABLET | Freq: Every day | ORAL | 0 refills | Status: AC

## 2022-01-19 NOTE — Discharge Summary (Signed)
Physician Discharge Summary   Patient: John Ball MRN: [id number] DOB: 1941-06-04  Admit date:     01/17/2022  Discharge date: 01/19/22  Discharge Physician: Jimmy Picket Lativia Velie   PCP: Ronnell Freshwater, NP   Recommendations at discharge:    Patient has been placed back on loop diuretic with furosemide 20 mg daily and instructions to increase to 40 mg daily in case of weigh gain 2 to 3 lbs in 24 hrs or 5 lbs in 7 days. Added K supplementation and follow up renal function in 7 days as outpatient.  Added clonazepam at night.    Discharge Diagnoses: Principal Problem:   Heart failure ([place]) Active Problems:   Acute on chronic combined systolic and diastolic CHF (congestive heart failure) (HCC)   Chronic obstructive pulmonary disease (HCC)   Essential hypertension   Hyperlipidemia with target LDL less than 70   Atrial fibrillation, chronic (HCC)   Malignant carcinoid tumor of lung (HCC)   OSA (obstructive sleep apnea)   Bipolar I disorder (HCC)   S/P CABG x 4   Peripheral arterial occlusive disease ([place])  Resolved Problems:   * No resolved hospital problems. [facility] Course: Mr. John Ball was admitted to the hospital with the working diagnosis of decompensated heart failure.   81 yo male with the past medical history of coronary artery disease, dyslipidemia, hypertension, heart failure, atrial fibrillation and COPD. Reported 3 to 4 days of worsening dyspnea with increase 02 requirements to 5 and 6 L per min from 4 L per min. Positive weight gain 5 lbs in 2 weeks. Patient at home with increase po intake, likely salt included.  On his initial physical examination his blood pressure was 127/78, HR 113 and RR 40 with 02 saturation 91%, heart with S1 and S2 present with no gallops, lungs with distant breath sounds, abdomen with no distention and positive lower extremity edema.   Na 140, K 4,7 CL 99, bicarbonate 28, glucose 108 bun 25 and cr 1,30  BNP 1,222 High sensitive  troponin 23  Wbc 6,1 hgb 12,3 plt 187  Sars covid 19 negative   Chest radiograph with cardiomegaly, loculated right pleural effusion, small pleural effusion on the left, bilateral interstitial infiltrates more on the right than left.   EKG 107 bpm with right axis deviation, normal intervals, sinus rhythm with PAC, no significant ST segment or T wave changes.   Patient was placed on IV furosemide with improvement in volume status. He will continue diuresis at home with oral furosemide with instructions to increase the dose in case of volume overload. Follow up with primary care as outpatient.   Assessment and Plan: Acute on chronic combined systolic and diastolic CHF (congestive heart failure) (HCC) Coronary artery disease with ischemic cardiomyopathy.  Follow up echocardiogram with mild improvement in LV systolic function to 30 to 35%, global hypokinesis, RV systolic function with mild reduction. Mild to moderate MR.   Acute on chronic core pulmonale.   His LV systolic function has improved compared to prior echocardiogram in 02/23 with 20 to 25%.   Continue medical therapy with spironolactone, losartan and empagliflozin Patient not on B blocker because COPD and low output heart failure.   Added back loop diuretic with furosemide 20 mg and instructions to increase to 40 in case of volume overload. Old records personally reviewed outpatient cardiology visit in March patient was no torsemide   Acute on chronic hypoxemic respiratory failure due to acute cardiogenic pulmonary edema and bilateral pleural effusion.  02 saturation at the time of his discharge is 99% on 4 L/min per [place].   Portable ultrasound was used to evaluate: Ejection fraction and Other pulmonary edema and heart function.  Noted A lines on lung examination bilaterally, no B lines, chronic low LV systolic function.     Chronic obstructive pulmonary disease (HCC) No clinical signs of exacerbation.  Continue  bronchodilator therapy and supplemental 02 per [place]. He has C02 retention consistent with chronic respiratory failure, hypercapnic.   Tobacco abuse Smoking cessation counseling, added nicotine patch.   Essential hypertension Continue blood pressure control with losartan.  Continue diuresis with furosemide and empagliflozin.   Atrial fibrillation, chronic (HCC) Continue rate control with amiodarone and anticoagulation with apixaban.   Hyperlipidemia with target LDL less than 70 Continue with statin therapy.   Malignant carcinoid tumor of lung [facility]) Old records personally reviewed, he had Hx of NSCLC sp right lower lobectomy 2012.  LLL nodule with increased size from 5 to 9 mm.  Not candidate for biopsy, plan to follow CT chest as outpatient, may need PET scan if continue growing. Continue outpatient follow up with Pulmonary.   Bipolar I disorder ([place]) Continue with seroquel, duloxetine and trazodone. In the past patient has been on clonazepam but discontinue due to risk of respiratory compromise.  His wife would like to resume lower dose due to uncontrolled anxiety at night  Patient had clonazepam 0,25 mg at night with good toleration. Plan to continue as outpatient.          Consultants: none  Procedures performed: none   Disposition: Home Diet recommendation:  Cardiac diet DISCHARGE MEDICATION: Allergies as of 01/19/2022       Reactions   Aspirin Other (See Comments)   Avoids due to severe bruises   Crestor [rosuvastatin Calcium] Other (See Comments)   myalgia   Lipitor [atorvastatin Calcium] Other (See Comments)   myalgia   Zetia [ezetimibe] Other (See Comments)   myalgia   Zocor [simvastatin - High Dose] Other (See Comments)   myalgia   Atorvastatin Itching   Other reaction(s): Joint swelling Other reaction(s): Joint swelling   Atorvastatin Calcium Itching, Swelling   Joint swelling   Aripiprazole Other (See Comments)   Altered mental status   Effexor  [venlafaxine Hydrochloride] Itching   Morphine And Related Itching        Medication List     TAKE these medications    acetaminophen 500 MG tablet Commonly known as: TYLENOL Take 1,000 mg by mouth every 6 (six) hours as needed for moderate pain or headache.   albuterol 108 (90 Base) MCG/ACT inhaler Commonly known as: VENTOLIN HFA Inhale 1 puff into the lungs every 6 (six) hours as needed for wheezing or shortness of breath.   apixaban 5 MG Tabs tablet Commonly known as: ELIQUIS Take 1 tablet (5 mg total) by mouth 2 (two) times daily.   cholecalciferol 25 MCG (1000 UNIT) tablet Commonly known as: VITAMIN D Take 1,000 Units by mouth in the morning.   clonazePAM 0.25 MG disintegrating tablet Commonly known as: KLONOPIN Take 1 tablet (0.25 mg total) by mouth at bedtime.   Cymbalta 60 MG capsule Generic drug: DULoxetine Take 60 mg by mouth in the morning.   empagliflozin 10 MG Tabs tablet Commonly known as: JARDIANCE Take 1 tablet (10 mg total) by mouth daily.   furosemide 20 MG tablet Commonly known as: LASIX Take 1 tablet (20 mg total) by mouth daily. Please increase to 40 mg daily in case  of weight increase 2 to 3 lbs in 24 hrs or 5 lbs in 7 days. Start taking on: January 20, 2022   losartan 25 MG tablet Commonly known as: COZAAR Take 0.5 tablets (12.5 mg total) by mouth at bedtime.   nitroGLYCERIN 0.4 MG SL tablet Commonly known as: NITROSTAT Place 1 tablet (0.4 mg total) under the tongue every 5 (five) minutes as needed for chest pain.   OXYGEN Inhale 5 L into the lungs continuous. Uses 4L at bedtime and if needed throughout the day -- intolerant to CPAP   potassium chloride 10 MEQ tablet Commonly known as: KLOR-CON M Take 1 tablet (10 mEq total) by mouth daily.   prazosin 1 MG capsule Commonly known as: MINIPRESS Take 1 mg by mouth at bedtime.   QUEtiapine Fumarate 150 MG 24 hr tablet Commonly known as: SEROQUEL XR Take 150 mg by mouth at bedtime.    spironolactone 25 MG tablet Commonly known as: ALDACTONE Take 0.5 tablets (12.5 mg total) by mouth daily.   Tiotropium Bromide Monohydrate 2.5 MCG/ACT Aers Inhale 2 puffs into the lungs in the morning.   traZODone 100 MG tablet Commonly known as: DESYREL Take 100 mg by mouth at bedtime.   Wixela Inhub 250-50 MCG/ACT Aepb Generic drug: fluticasone-salmeterol Inhale 1 puff into the lungs 2 (two) times daily.        Discharge Exam: Filed Weights   01/17/22 2048 01/18/22 0400 01/19/22 0349  Weight: 69.1 kg 67.8 kg 64.1 kg   BP 98/60 (BP Location: Left Arm)   Pulse 75   Temp 97.6 F (36.4 C) (Oral)   Resp 19   Ht 6' (1.829 m)   Wt 64.1 kg   SpO2 94%   BMI 19.16 kg/m   Patient is feeling better, dyspnea is back to baseline and edema has resolved.   Neurology awake and alert ENT with no pallor Cardiovascular with S1 and S2 present and rhythmic, no gallops, or rubs No JVD Lungs with no rales or wheezing Abdomen not distended  No  lower extremity edema   Condition at discharge: stable  The results of significant diagnostics from this hospitalization (including imaging, microbiology, ancillary and laboratory) are listed below for reference.   Imaging Studies: ECHOCARDIOGRAM COMPLETE  Result Date: 01/18/2022    ECHOCARDIOGRAM REPORT   Patient Name:   LENWOOD BALSAM Date of Exam: 01/18/2022 Medical Rec #:  [id number]        Height:       72.0 in Accession #:    [id number]       Weight:       149.5 lb Date of Birth:  1941/06/04         BSA:          1.883 m Patient Age:    81 years         BP:           112/80 mmHg Patient Gender: M                HR:           79 bpm. Exam Location:  Inpatient Procedure: 2D Echo, Cardiac Doppler and Color Doppler Indications:    Dyspnea  History:        Patient has prior history of Echocardiogram examinations, most                 recent 08/09/2021. CHF, CAD, Prior CABG, COPD and PAD; Risk  Factors:Current Smoker, Sleep Apnea,  Dyslipidemia and                 Hypertension. [place].  Sonographer:    [name] Referring Phys: [id number] Candace Gallus MELVIN  Sonographer Comments: Image acquisition challenging due to uncooperative patient. IMPRESSIONS  1. Left ventricular ejection fraction, by estimation, is 30 to 35%. The left ventricle has moderately decreased function. The left ventricle demonstrates global hypokinesis. Left ventricular diastolic parameters are consistent with Grade II diastolic dysfunction (pseudonormalization).  2. Right ventricular systolic function is mildly reduced. The right ventricular size is normal.  3. The mitral valve is normal in structure. Mild to moderate mitral valve regurgitation. No evidence of mitral stenosis.  4. The aortic valve is tricuspid. There is mild calcification of the aortic valve. Aortic valve regurgitation is not visualized. Aortic valve sclerosis is present, with no evidence of aortic valve stenosis.  5. The inferior vena cava is dilated in size with <50% respiratory variability, suggesting right atrial pressure of 15 mmHg. Comparison(s): Prior images reviewed side by side. Prior EF 25%. FINDINGS  Left Ventricle: Left ventricular ejection fraction, by estimation, is 30 to 35%. The left ventricle has moderately decreased function. The left ventricle demonstrates global hypokinesis. The left ventricular internal cavity size was normal in size. There is no left ventricular hypertrophy. Left ventricular diastolic parameters are consistent with Grade II diastolic dysfunction (pseudonormalization). Right Ventricle: The right ventricular size is normal. No increase in right ventricular wall thickness. Right ventricular systolic function is mildly reduced. Left Atrium: Left atrial size was normal in size. Right Atrium: Right atrial size was normal in size. Pericardium: There is no evidence of pericardial effusion. Mitral Valve: The mitral valve is normal in structure. Mild to moderate mitral valve  regurgitation. No evidence of mitral valve stenosis. Tricuspid Valve: The tricuspid valve is normal in structure. Tricuspid valve regurgitation is not demonstrated. No evidence of tricuspid stenosis. Aortic Valve: The aortic valve is tricuspid. There is mild calcification of the aortic valve. Aortic valve regurgitation is not visualized. Aortic valve sclerosis is present, with no evidence of aortic valve stenosis. Pulmonic Valve: The pulmonic valve was normal in structure. Pulmonic valve regurgitation is not visualized. No evidence of pulmonic stenosis. Aorta: The aortic root is normal in size and structure. Venous: The inferior vena cava is dilated in size with less than 50% respiratory variability, suggesting right atrial pressure of 15 mmHg. IAS/Shunts: No atrial level shunt detected by color flow Doppler.  LEFT VENTRICLE PLAX 2D LVIDd:         6.45 cm      Diastology LVIDs:         6.10 cm      LV e' medial:    5.52 cm/s LV PW:         0.70 cm      LV E/e' medial:  20.8 LV IVS:        0.85 cm      LV e' lateral:   4.38 cm/s LVOT diam:     2.40 cm      LV E/e' lateral: 26.3 LV SV:         70 LV SV Index:   37 LVOT Area:     4.52 cm  LV Volumes (MOD) LV vol d, MOD A2C: 200.0 ml LV vol d, MOD A4C: 184.0 ml LV vol s, MOD A2C: 169.0 ml LV vol s, MOD A4C: 127.0 ml LV SV MOD A2C:     31.0 ml  LV SV MOD A4C:     184.0 ml LV SV MOD BP:      44.1 ml RIGHT VENTRICLE            IVC RV S prime:     8.59 cm/s  IVC diam: 2.70 cm TAPSE (M-mode): 1.4 cm LEFT ATRIUM             Index LA diam:        3.90 cm 2.07 cm/m LA Vol (A2C):   71.3 ml 37.87 ml/m LA Vol (A4C):   80.8 ml 42.92 ml/m LA Biplane Vol: 76.8 ml 40.80 ml/m  AORTIC VALVE LVOT Vmax:   104.00 cm/s LVOT Vmean:  63.300 cm/s LVOT VTI:    0.155 m  AORTA Ao Root diam: 3.80 cm Ao Asc diam:  3.90 cm MITRAL VALVE MV Area (PHT): 4.65 cm       SHUNTS MV Decel Time: 163 msec       Systemic VTI:  0.16 m MR Peak grad:    71.8 mmHg    Systemic Diam: 2.40 cm MR Mean grad:     55.0 mmHg MR Vmax:         423.67 cm/s MR Vmean:        352.0 cm/s MR PISA:         0.25 cm MR PISA Eff ROA: 2 mm MR PISA Radius:  0.20 cm MV E velocity: 115.00 cm/s MV A velocity: 39.70 cm/s MV E/A ratio:  2.90 Candee Furbish MD Electronically signed by Candee Furbish MD Signature Date/Time: 01/18/2022/11:59:52 AM    Final    DG Chest Port 1 View  Result Date: 01/17/2022 CLINICAL DATA:  Shortness of breath. EXAM: PORTABLE CHEST 1 VIEW COMPARISON:  Chest x-ray 09/14/2021 FINDINGS: Patient is status post cardiac surgery. The cardiac silhouette is mildly enlarged, unchanged. PICC line has been removed. There is central pulmonary vascular congestion. There is some patchy airspace opacities in both lung bases. Again seen is elevation of the right hemidiaphragm. Small right pleural effusion is likely present. Small left pleural effusion is present. There is no evidence for pneumothorax or acute fracture. IMPRESSION: 1. Cardiomegaly with central pulmonary vascular congestion and small pleural effusions. 2. Bilateral airspace opacities may represent infection or edema. Follow-up chest x-ray recommended in 4-6 weeks to confirm resolution. Electronically Signed   By: Ronney Asters M.D.   On: 01/17/2022 17:30    Microbiology: Results for orders placed or performed during the hospital encounter of 01/17/22  SARS Coronavirus 2 by RT PCR (hospital order, performed in [facility] hospital lab) *cepheid single result test* Anterior Nasal Swab     Status: None   Collection Time: 01/17/22  5:48 PM   Specimen: Anterior Nasal Swab  Result Value Ref Range Status   SARS Coronavirus 2 by RT PCR NEGATIVE NEGATIVE Final    Comment: (NOTE) SARS-CoV-2 target nucleic acids are NOT DETECTED.  The SARS-CoV-2 RNA is generally detectable in upper and lower respiratory specimens during the acute phase of infection. The lowest concentration of SARS-CoV-2 viral copies this assay can detect is 250 copies / mL. A negative result does not  preclude SARS-CoV-2 infection and should not be used as the sole basis for treatment or other patient management decisions.  A negative result may occur with improper specimen collection / handling, submission of specimen other than nasopharyngeal swab, presence of viral mutation(s) within the areas targeted by this assay, and inadequate number of viral copies (<250 copies / mL). A negative  result must be combined with clinical observations, patient history, and epidemiological information.  Fact Sheet for Patients:   https://www.patel.info/  Fact Sheet for Healthcare Providers: https://hall.com/  This test is not yet approved or  cleared by the [place] FDA and has been authorized for detection and/or diagnosis of SARS-CoV-2 by FDA under an Emergency Use Authorization (EUA).  This EUA will remain in effect (meaning this test can be used) for the duration of the COVID-19 declaration under Section 564(b)(1) of the Act, 21 U.S.C. section 360bbb-3(b)(1), unless the authorization is terminated or revoked sooner.  Performed at [facility], [place] [address], [place], [place] [address]     Labs: CBC: Recent Labs  Lab 01/17/22 1644 01/17/22 1831 01/18/22 0432  WBC 6.1  --  4.6  NEUTROABS 4.8  --   --   HGB 12.3* 13.3 11.5*  HCT 39.3 39.0 35.7*  MCV 115.2*  --  111.9*  PLT 187  --  132*   Basic Metabolic Panel: Recent Labs  Lab 01/17/22 1644 01/17/22 1831 01/17/22 1948 01/18/22 0432 01/19/22 0322  NA 140 137  --  138 139  K 4.7 4.3  --  4.2 3.5  CL 99  --   --  101 95*  CO2 28  --   --  29 35*  GLUCOSE 108*  --   --  98 96  BUN 25*  --   --  24* 21  CREATININE 1.30*  --   --  1.31* 1.16  CALCIUM 9.5  --   --  9.2 9.1  MG  --   --  2.1  --   --    Liver Function Tests: Recent Labs  Lab 01/18/22 0432  AST 20  ALT 15  ALKPHOS 47  BILITOT 0.5  PROT 6.2*  ALBUMIN 3.8   CBG: No results for input(s): "GLUCAP"  in the last 168 hours.  Discharge time spent: greater than 30 minutes.  Signed: Tawni Millers, MD [facility] 01/19/2022

## 2022-01-19 NOTE — Assessment & Plan Note (Signed)
Old records personally reviewed, he had Hx of NSCLC sp right lower lobectomy 2012.  LLL nodule with increased size from 5 to 9 mm.  Not candidate for biopsy, plan to follow CT chest as outpatient, may need PET scan if continue growing. Continue outpatient follow up with Pulmonary.

## 2022-01-26 ENCOUNTER — Emergency Department (HOSPITAL_COMMUNITY): Payer: Medicare Other

## 2022-01-26 ENCOUNTER — Encounter (HOSPITAL_COMMUNITY): Payer: Self-pay | Admitting: Emergency Medicine

## 2022-01-26 ENCOUNTER — Other Ambulatory Visit: Payer: Self-pay

## 2022-01-26 ENCOUNTER — Inpatient Hospital Stay (HOSPITAL_COMMUNITY)
Admission: EM | Admit: 2022-01-26 | Discharge: 2022-01-28 | DRG: 291 | Disposition: A | Discharge status: Hospice | Payer: Medicare Other | Attending: Family Medicine | Admitting: Family Medicine

## 2022-01-26 DIAGNOSIS — R6889 Other general symptoms and signs: Secondary | ICD-10-CM | POA: Diagnosis present

## 2022-01-26 DIAGNOSIS — J449 Chronic obstructive pulmonary disease, unspecified: Secondary | ICD-10-CM | POA: Diagnosis present

## 2022-01-26 DIAGNOSIS — F4310 Post-traumatic stress disorder, unspecified: Secondary | ICD-10-CM | POA: Diagnosis not present

## 2022-01-26 DIAGNOSIS — G4733 Obstructive sleep apnea (adult) (pediatric): Secondary | ICD-10-CM | POA: Diagnosis not present

## 2022-01-26 DIAGNOSIS — Z720 Tobacco use: Secondary | ICD-10-CM | POA: Diagnosis present

## 2022-01-26 DIAGNOSIS — F17210 Nicotine dependence, cigarettes, uncomplicated: Secondary | ICD-10-CM | POA: Diagnosis present

## 2022-01-26 DIAGNOSIS — Z7901 Long term (current) use of anticoagulants: Secondary | ICD-10-CM | POA: Diagnosis not present

## 2022-01-26 DIAGNOSIS — E785 Hyperlipidemia, unspecified: Secondary | ICD-10-CM | POA: Diagnosis present

## 2022-01-26 DIAGNOSIS — Z66 Do not resuscitate: Secondary | ICD-10-CM | POA: Diagnosis not present

## 2022-01-26 DIAGNOSIS — I5043 Acute on chronic combined systolic (congestive) and diastolic (congestive) heart failure: Secondary | ICD-10-CM | POA: Diagnosis not present

## 2022-01-26 DIAGNOSIS — Z7189 Other specified counseling: Secondary | ICD-10-CM | POA: Diagnosis not present

## 2022-01-26 DIAGNOSIS — Z888 Allergy status to other drugs, medicaments and biological substances status: Secondary | ICD-10-CM

## 2022-01-26 DIAGNOSIS — E1122 Type 2 diabetes mellitus with diabetic chronic kidney disease: Secondary | ICD-10-CM | POA: Diagnosis present

## 2022-01-26 DIAGNOSIS — I509 Heart failure, unspecified: Secondary | ICD-10-CM

## 2022-01-26 DIAGNOSIS — Z951 Presence of aortocoronary bypass graft: Secondary | ICD-10-CM

## 2022-01-26 DIAGNOSIS — Z85118 Personal history of other malignant neoplasm of bronchus and lung: Secondary | ICD-10-CM | POA: Diagnosis not present

## 2022-01-26 DIAGNOSIS — Z885 Allergy status to narcotic agent status: Secondary | ICD-10-CM

## 2022-01-26 DIAGNOSIS — R627 Adult failure to thrive: Secondary | ICD-10-CM | POA: Diagnosis present

## 2022-01-26 DIAGNOSIS — N179 Acute kidney failure, unspecified: Secondary | ICD-10-CM | POA: Diagnosis present

## 2022-01-26 DIAGNOSIS — R0603 Acute respiratory distress: Secondary | ICD-10-CM

## 2022-01-26 DIAGNOSIS — I2510 Atherosclerotic heart disease of native coronary artery without angina pectoris: Secondary | ICD-10-CM | POA: Diagnosis present

## 2022-01-26 DIAGNOSIS — I482 Chronic atrial fibrillation, unspecified: Secondary | ICD-10-CM | POA: Diagnosis present

## 2022-01-26 DIAGNOSIS — N1831 Chronic kidney disease, stage 3a: Secondary | ICD-10-CM | POA: Diagnosis present

## 2022-01-26 DIAGNOSIS — Z20822 Contact with and (suspected) exposure to covid-19: Secondary | ICD-10-CM | POA: Diagnosis present

## 2022-01-26 DIAGNOSIS — I130 Hypertensive heart and chronic kidney disease with heart failure and stage 1 through stage 4 chronic kidney disease, or unspecified chronic kidney disease: Principal | ICD-10-CM | POA: Diagnosis present

## 2022-01-26 DIAGNOSIS — Z79899 Other long term (current) drug therapy: Secondary | ICD-10-CM

## 2022-01-26 DIAGNOSIS — Z9981 Dependence on supplemental oxygen: Secondary | ICD-10-CM

## 2022-01-26 DIAGNOSIS — I480 Paroxysmal atrial fibrillation: Secondary | ICD-10-CM | POA: Diagnosis present

## 2022-01-26 DIAGNOSIS — H9193 Unspecified hearing loss, bilateral: Secondary | ICD-10-CM | POA: Diagnosis present

## 2022-01-26 DIAGNOSIS — Z6820 Body mass index (BMI) 20.0-20.9, adult: Secondary | ICD-10-CM

## 2022-01-26 DIAGNOSIS — R0602 Shortness of breath: Secondary | ICD-10-CM | POA: Diagnosis not present

## 2022-01-26 DIAGNOSIS — J90 Pleural effusion, not elsewhere classified: Secondary | ICD-10-CM | POA: Diagnosis not present

## 2022-01-26 DIAGNOSIS — F319 Bipolar disorder, unspecified: Secondary | ICD-10-CM | POA: Diagnosis not present

## 2022-01-26 DIAGNOSIS — C3490 Malignant neoplasm of unspecified part of unspecified bronchus or lung: Secondary | ICD-10-CM

## 2022-01-26 DIAGNOSIS — R079 Chest pain, unspecified: Secondary | ICD-10-CM | POA: Diagnosis not present

## 2022-01-26 DIAGNOSIS — Z823 Family history of stroke: Secondary | ICD-10-CM

## 2022-01-26 DIAGNOSIS — E1151 Type 2 diabetes mellitus with diabetic peripheral angiopathy without gangrene: Secondary | ICD-10-CM | POA: Diagnosis not present

## 2022-01-26 DIAGNOSIS — J9621 Acute and chronic respiratory failure with hypoxia: Secondary | ICD-10-CM | POA: Diagnosis not present

## 2022-01-26 DIAGNOSIS — I10 Essential (primary) hypertension: Secondary | ICD-10-CM | POA: Diagnosis not present

## 2022-01-26 DIAGNOSIS — E43 Unspecified severe protein-calorie malnutrition: Secondary | ICD-10-CM | POA: Diagnosis not present

## 2022-01-26 DIAGNOSIS — Z515 Encounter for palliative care: Secondary | ICD-10-CM

## 2022-01-26 DIAGNOSIS — F410 Panic disorder [episodic paroxysmal anxiety] without agoraphobia: Secondary | ICD-10-CM | POA: Diagnosis present

## 2022-01-26 DIAGNOSIS — F411 Generalized anxiety disorder: Secondary | ICD-10-CM | POA: Diagnosis present

## 2022-01-26 DIAGNOSIS — Z820 Family history of epilepsy and other diseases of the nervous system: Secondary | ICD-10-CM

## 2022-01-26 DIAGNOSIS — Z86718 Personal history of other venous thrombosis and embolism: Secondary | ICD-10-CM

## 2022-01-26 DIAGNOSIS — I110 Hypertensive heart disease with heart failure: Secondary | ICD-10-CM | POA: Diagnosis not present

## 2022-01-26 HISTORY — DX: Chronic respiratory failure, unspecified whether with hypoxia or hypercapnia: J96.10

## 2022-01-26 HISTORY — DX: Unspecified systolic (congestive) heart failure: I50.20

## 2022-01-26 HISTORY — DX: Nonrheumatic mitral (valve) insufficiency: I34.0

## 2022-01-26 HISTORY — DX: Atherosclerotic heart disease of native coronary artery without angina pectoris: I25.10

## 2022-01-26 HISTORY — DX: Chronic kidney disease, stage 3a: N18.31

## 2022-01-26 HISTORY — DX: Paroxysmal atrial fibrillation: I48.0

## 2022-01-26 HISTORY — DX: Hallucinations, unspecified: R44.3

## 2022-01-26 LAB — COMPREHENSIVE METABOLIC PANEL
ALT: 26 U/L (ref 0–44)
AST: 25 U/L (ref 15–41)
Albumin: 4 g/dL (ref 3.5–5.0)
Alkaline Phosphatase: 59 U/L (ref 38–126)
Anion gap: 10 (ref 5–15)
BUN: 33 mg/dL — ABNORMAL HIGH (ref 8–23)
CO2: 29 mmol/L (ref 22–32)
Calcium: 9.5 mg/dL (ref 8.9–10.3)
Chloride: 99 mmol/L (ref 98–111)
Creatinine, Ser: 1.42 mg/dL — ABNORMAL HIGH (ref 0.61–1.24)
GFR, Estimated: 51 mL/min — ABNORMAL LOW (ref >60)
Glucose, Bld: 125 mg/dL — ABNORMAL HIGH (ref 70–99)
Potassium: 4.6 mmol/L (ref 3.5–5.1)
Sodium: 138 mmol/L (ref 135–145)
Total Bilirubin: 0.3 mg/dL (ref 0.3–1.2)
Total Protein: 6.9 g/dL (ref 6.5–8.1)

## 2022-01-26 LAB — CBC
HCT: 39.2 % (ref 39.0–52.0)
Hemoglobin: 12.3 g/dL — ABNORMAL LOW (ref 13.0–17.0)
MCH: 36.1 pg — ABNORMAL HIGH (ref 26.0–34.0)
MCHC: 31.4 g/dL (ref 30.0–36.0)
MCV: 115 fL — ABNORMAL HIGH (ref 80.0–100.0)
Platelets: 198 10*3/uL (ref 150–400)
RBC: 3.41 MIL/uL — ABNORMAL LOW (ref 4.22–5.81)
RDW: 12.8 % (ref 11.5–15.5)
WBC: 6.5 10*3/uL (ref 4.0–10.5)
nRBC: 0 % (ref 0.0–0.2)

## 2022-01-26 LAB — I-STAT VENOUS BLOOD GAS, ED
Acid-Base Excess: 7 mmol/L — ABNORMAL HIGH (ref 0.0–2.0)
Bicarbonate: 34.4 mmol/L — ABNORMAL HIGH (ref 20.0–28.0)
Calcium, Ion: 1.2 mmol/L (ref 1.15–1.40)
HCT: 40 % (ref 39.0–52.0)
Hemoglobin: 13.6 g/dL (ref 13.0–17.0)
O2 Saturation: 90 %
Potassium: 4.9 mmol/L (ref 3.5–5.1)
Sodium: 138 mmol/L (ref 135–145)
TCO2: 36 mmol/L — ABNORMAL HIGH (ref 22–32)
pCO2, Ven: 58.6 mmHg (ref 44–60)
pH, Ven: 7.376 (ref 7.25–7.43)
pO2, Ven: 61 mmHg — ABNORMAL HIGH (ref 32–45)

## 2022-01-26 LAB — TROPONIN I (HIGH SENSITIVITY): Troponin I (High Sensitivity): 31 ng/L — ABNORMAL HIGH (ref <18)

## 2022-01-26 LAB — SARS CORONAVIRUS 2 BY RT PCR: SARS Coronavirus 2 by RT PCR: NEGATIVE

## 2022-01-26 LAB — BRAIN NATRIURETIC PEPTIDE: B Natriuretic Peptide: 1322.4 pg/mL — ABNORMAL HIGH (ref 0.0–100.0)

## 2022-01-26 MED ORDER — ACETAMINOPHEN 325 MG PO TABS
650.0000 mg | ORAL_TABLET | Freq: Four times a day (QID) | ORAL | Status: DC | PRN
  Administered 2022-01-28: 650 mg via ORAL
  Filled 2022-01-26: qty 2

## 2022-01-26 MED ORDER — FUROSEMIDE 10 MG/ML IJ SOLN
40.0000 mg | Freq: Once | INTRAMUSCULAR | Status: AC
  Administered 2022-01-26: 40 mg via INTRAVENOUS
  Filled 2022-01-26: qty 4

## 2022-01-26 MED ORDER — ACETAMINOPHEN 650 MG RE SUPP: 650.0000 mg | Freq: Four times a day (QID) | RECTAL | Status: DC | PRN

## 2022-01-26 MED ORDER — IPRATROPIUM BROMIDE 0.02 % IN SOLN
0.5000 mg | Freq: Once | RESPIRATORY_TRACT | Status: DC
  Filled 2022-01-26: qty 2.5

## 2022-01-26 MED ORDER — FUROSEMIDE 10 MG/ML IJ SOLN
40.0000 mg | Freq: Every day | INTRAMUSCULAR | Status: DC
  Administered 2022-01-27 – 2022-01-28 (×2): 40 mg via INTRAVENOUS
  Filled 2022-01-26 (×3): qty 4

## 2022-01-26 MED ORDER — APIXABAN 5 MG PO TABS
5.0000 mg | ORAL_TABLET | Freq: Two times a day (BID) | ORAL | Status: DC
  Administered 2022-01-27 – 2022-01-28 (×4): 5 mg via ORAL
  Filled 2022-01-26 (×4): qty 1

## 2022-01-26 MED ORDER — ONDANSETRON HCL 4 MG/2ML IJ SOLN: 4.0000 mg | Freq: Four times a day (QID) | INTRAMUSCULAR | Status: DC | PRN

## 2022-01-26 MED ORDER — LORAZEPAM 2 MG/ML IJ SOLN
1.0000 mg | Freq: Once | INTRAMUSCULAR | Status: AC
  Administered 2022-01-26: 1 mg via INTRAVENOUS
  Filled 2022-01-26: qty 1

## 2022-01-26 MED ORDER — TRAZODONE HCL 50 MG PO TABS
100.0000 mg | ORAL_TABLET | Freq: Every day | ORAL | Status: DC
  Administered 2022-01-26 – 2022-01-27 (×2): 100 mg via ORAL
  Filled 2022-01-26 (×2): qty 2

## 2022-01-26 MED ORDER — ALBUTEROL SULFATE (2.5 MG/3ML) 0.083% IN NEBU
5.0000 mg | INHALATION_SOLUTION | Freq: Once | RESPIRATORY_TRACT | Status: DC
  Filled 2022-01-26: qty 6

## 2022-01-26 MED ORDER — DULOXETINE HCL 60 MG PO CPEP
60.0000 mg | ORAL_CAPSULE | Freq: Every morning | ORAL | Status: DC
  Administered 2022-01-27 – 2022-01-28 (×2): 60 mg via ORAL
  Filled 2022-01-26: qty 2
  Filled 2022-01-26: qty 1

## 2022-01-26 MED ORDER — CLONAZEPAM 0.25 MG PO TBDP
0.5000 mg | ORAL_TABLET | Freq: Every day | ORAL | Status: DC
  Administered 2022-01-26 – 2022-01-27 (×2): 0.5 mg via ORAL
  Filled 2022-01-26: qty 2
  Filled 2022-01-26: qty 4

## 2022-01-26 MED ORDER — ONDANSETRON HCL 4 MG PO TABS: 4.0000 mg | ORAL_TABLET | Freq: Four times a day (QID) | ORAL | Status: DC | PRN

## 2022-01-26 MED ORDER — HYDROCODONE-ACETAMINOPHEN 5-325 MG PO TABS
1.0000 | ORAL_TABLET | ORAL | Status: DC | PRN
  Administered 2022-01-27: 2 via ORAL
  Filled 2022-01-26: qty 2

## 2022-01-26 MED ORDER — MOMETASONE FURO-FORMOTEROL FUM 200-5 MCG/ACT IN AERO
2.0000 | INHALATION_SPRAY | Freq: Two times a day (BID) | RESPIRATORY_TRACT | Status: DC
  Administered 2022-01-27 – 2022-01-28 (×2): 2 via RESPIRATORY_TRACT
  Filled 2022-01-26: qty 8.8

## 2022-01-26 MED ORDER — UMECLIDINIUM BROMIDE 62.5 MCG/ACT IN AEPB
1.0000 | INHALATION_SPRAY | Freq: Every morning | RESPIRATORY_TRACT | Status: DC
  Filled 2022-01-26: qty 7

## 2022-01-26 MED ORDER — GUAIFENESIN ER 600 MG PO TB12
600.0000 mg | ORAL_TABLET | Freq: Two times a day (BID) | ORAL | Status: DC
  Administered 2022-01-27 – 2022-01-28 (×3): 600 mg via ORAL
  Filled 2022-01-26 (×3): qty 1

## 2022-01-26 MED ORDER — SODIUM CHLORIDE 0.9% FLUSH: 3.0000 mL | INTRAVENOUS | Status: DC | PRN

## 2022-01-26 MED ORDER — QUETIAPINE FUMARATE ER 50 MG PO TB24
150.0000 mg | ORAL_TABLET | Freq: Every day | ORAL | Status: DC
  Administered 2022-01-27 (×2): 150 mg via ORAL
  Filled 2022-01-26 (×3): qty 3

## 2022-01-26 MED ORDER — PRAZOSIN HCL 1 MG PO CAPS
1.0000 mg | ORAL_CAPSULE | Freq: Every day | ORAL | Status: DC
  Administered 2022-01-27: 1 mg via ORAL
  Filled 2022-01-26 (×3): qty 1

## 2022-01-26 MED ORDER — NICOTINE 21 MG/24HR TD PT24
21.0000 mg | MEDICATED_PATCH | Freq: Every day | TRANSDERMAL | Status: DC
Start: 2022-01-27 — End: 2022-01-28
  Administered 2022-01-27 – 2022-01-28 (×2): 21 mg via TRANSDERMAL
  Filled 2022-01-26 (×2): qty 1

## 2022-01-26 MED ORDER — SODIUM CHLORIDE 0.9 % IV SOLN: 250.0000 mL | INTRAVENOUS | Status: DC | PRN

## 2022-01-26 MED ORDER — SODIUM CHLORIDE 0.9% FLUSH
3.0000 mL | Freq: Two times a day (BID) | INTRAVENOUS | Status: DC
  Administered 2022-01-27 – 2022-01-28 (×3): 3 mL via INTRAVENOUS

## 2022-01-26 MED ORDER — ALBUTEROL SULFATE (2.5 MG/3ML) 0.083% IN NEBU: 3.0000 mL | INHALATION_SOLUTION | Freq: Four times a day (QID) | RESPIRATORY_TRACT | Status: DC | PRN

## 2022-01-26 NOTE — ED Triage Notes (Signed)
Patient reports worsening central chest pain with SOB /chest congestion onset today , no emesis or diaphoresis , history of CHF/CAD/COPD .

## 2022-01-26 NOTE — ED Provider Notes (Signed)
[facility] EMERGENCY DEPARTMENT Provider Note   CSN: [id number] Arrival date & time: 01/26/22  2017     History  Chief Complaint  Patient presents with   Chest Pain   Shortness of Breath    Hx. CHF    John Ball is a 81 y.o. male.  Pt is a 80y/o male with hx of coronary artery disease, dyslipidemia, hypertension, heart failure, atrial fibrillation and COPD, carcinoid tumor of the lung and PAD s/p fempop bypass who was recently discharged on 01/19/2022 after having CHF exacerbation  with echo at that time with EF of 30-35% who wears 4 L of oxygen at home chronically and presents with his wife due to worsening shortness of breath over the last 2 days.  She has been following his weight and it has not changed significantly and she continues to give him 20 mg of Lasix per day.  He has had a gradually worsening cough and worsening shortness of breath that did not even seem to help when she increased him to 8 L of oxygen.  When he is having productive cough it is white.  He has felt chilled occasionally but they are unaware of him having a fever.  He has not been confused, abdominal pain or vomiting but does complain of pain in his chest.  He has been gradually worsening over the last 2 days which prompted her to bring him this evening.  He has not had significant swelling in his legs and since his weight had not changed she did not increase his Lasix.  He has been using his breathing treatments at home but they have not improved his symptoms.  Any type of exertion makes his shortness of breath worse.  He complains of pain that is worse when he takes a deep breath.  The history is provided by the spouse and medical records.  Chest Pain Associated symptoms: shortness of breath   Shortness of Breath Associated symptoms: chest pain        Home Medications Prior to Admission medications   Medication Sig Start Date End Date Taking? Authorizing Provider  acetaminophen  (TYLENOL) 500 MG tablet Take 1,000 mg by mouth every 6 (six) hours as needed for moderate pain or headache.    Yes [provider]  albuterol (ACCUNEB) 1.25 MG/3ML nebulizer solution Take 1 ampule by nebulization every 6 (six) hours as needed for wheezing. 2.5 mg/3 ml   Yes [provider]  albuterol (PROVENTIL HFA;VENTOLIN HFA) 108 (90 BASE) MCG/ACT inhaler Inhale 1 puff into the lungs every 6 (six) hours as needed for wheezing or shortness of breath.   Yes [provider]  apixaban (ELIQUIS) 5 MG TABS tablet Take 1 tablet (5 mg total) by mouth 2 (two) times daily. 09/18/21  Yes Larey Dresser, MD  cholecalciferol (VITAMIN D) 25 MCG (1000 UNIT) tablet Take 1,000 Units by mouth in the morning. 04/20/21  Yes [provider]  clonazePAM (KLONOPIN) 0.25 MG disintegrating tablet Take 1 tablet (0.25 mg total) by mouth at bedtime. Patient taking differently: Take 0.5 mg by mouth at bedtime. 01/19/22 02/18/22 Yes Arrien, Jimmy Picket, MD  CYMBALTA 60 MG capsule Take 60 mg by mouth in the morning. 10/31/19  Yes [provider]  digoxin (LANOXIN) 0.125 MG tablet Take 0.125 mg by mouth daily.   Yes [provider]  furosemide (LASIX) 20 MG tablet Take 1 tablet (20 mg total) by mouth daily. Please increase to 40 mg daily in case  of weight increase 2 to 3 lbs in 24 hrs or 5 lbs in 7 days. 01/20/22  Yes Arrien, Jimmy Picket, MD  ibuprofen (ADVIL) 200 MG tablet Take 400 mg by mouth every 6 (six) hours as needed for moderate pain.   Yes [provider]  losartan (COZAAR) 25 MG tablet Take 0.5 tablets (12.5 mg total) by mouth at bedtime. 11/21/21  Yes Milford, Maricela Bo, FNP  nitroGLYCERIN (NITROSTAT) 0.4 MG SL tablet Place 1 tablet (0.4 mg total) under the tongue every 5 (five) minutes as needed for chest pain. 08/28/17  Yes Leonie Man, MD  OXYGEN Inhale 5 L into the lungs continuous.   intolerant to CPAP   Yes [provider]  potassium  chloride (KLOR-CON M) 10 MEQ tablet Take 1 tablet (10 mEq total) by mouth daily. 01/19/22 02/18/22 Yes Arrien, Jimmy Picket, MD  prazosin (MINIPRESS) 1 MG capsule Take 1 mg by mouth at bedtime.   Yes [provider]  QUEtiapine Fumarate (SEROQUEL XR) 150 MG 24 hr tablet Take 150 mg by mouth at bedtime.   Yes [provider]  spironolactone (ALDACTONE) 25 MG tablet Take 0.5 tablets (12.5 mg total) by mouth daily. 11/09/21  Yes Milford, Maricela Bo, FNP  Tiotropium Bromide Monohydrate 2.5 MCG/ACT AERS Inhale 2 puffs into the lungs in the morning.   Yes [provider]  traZODone (DESYREL) 100 MG tablet Take 100 mg by mouth at bedtime.   Yes [provider]  Grant Ruts INHUB 250-50 MCG/ACT AEPB Inhale 1 puff into the lungs 2 (two) times daily. 07/25/21  Yes [provider]  empagliflozin (JARDIANCE) 10 MG TABS tablet Take 1 tablet (10 mg total) by mouth daily. 09/18/21   Larey Dresser, MD  torsemide (DEMADEX) 20 MG tablet Take 10 mg by mouth daily.    [provider]      Allergies    Aspirin, Crestor [rosuvastatin calcium], Lipitor [atorvastatin calcium], Zetia [ezetimibe], Zocor [simvastatin - high dose], Atorvastatin, Atorvastatin calcium, Aripiprazole, Effexor [venlafaxine hydrochloride], and Morphine and related    Review of Systems   Review of Systems  Respiratory:  Positive for shortness of breath.   Cardiovascular:  Positive for chest pain.    Physical Exam Updated Vital Signs BP 127/80   Pulse 100   Temp 98.7 F (37.1 C) (Oral)   Resp 18   SpO2 97%  Physical Exam Vitals and nursing note reviewed.  Constitutional:      General: He is in acute distress.     Appearance: He is well-developed. He is ill-appearing.  HENT:     Head: Normocephalic and atraumatic.     Mouth/Throat:     Mouth: Mucous membranes are dry.  Eyes:     Conjunctiva/sclera: Conjunctivae normal.     Pupils: Pupils are equal, round, and reactive to light.   Cardiovascular:     Rate and Rhythm: Regular rhythm. Tachycardia present.     Heart sounds: No murmur heard. Pulmonary:     Effort: Tachypnea and accessory muscle usage present. No respiratory distress.     Breath sounds: Rhonchi and rales present. No wheezing.  Abdominal:     General: There is no distension.     Palpations: Abdomen is soft.     Tenderness: There is no abdominal tenderness. There is no guarding or rebound.  Musculoskeletal:        General: No tenderness. Normal range of motion.     Cervical back: Normal range of motion and neck supple.  Comments: Trace edema in bilateral lower extremities with less than 3-second capillary refill  Skin:    General: Skin is warm and dry.     Findings: No erythema or rash.  Neurological:     Mental Status: He is alert and oriented to person, place, and time. Mental status is at baseline.  Psychiatric:     Comments: Calm and cooperative     ED Results / Procedures / Treatments   Labs (all labs ordered are listed, but only abnormal results are displayed) Labs Reviewed  CBC - Abnormal; Notable for the following components:      Result Value   RBC 3.41 (*)    Hemoglobin 12.3 (*)    MCV 115.0 (*)    MCH 36.1 (*)    All other components within normal limits  BRAIN NATRIURETIC PEPTIDE - Abnormal; Notable for the following components:   B Natriuretic Peptide 1,322.4 (*)    All other components within normal limits  COMPREHENSIVE METABOLIC PANEL - Abnormal; Notable for the following components:   Glucose, Bld 125 (*)    BUN 33 (*)    Creatinine, Ser 1.42 (*)    GFR, Estimated 51 (*)    All other components within normal limits  I-STAT VENOUS BLOOD GAS, ED - Abnormal; Notable for the following components:   pO2, Ven 61 (*)    Bicarbonate 34.4 (*)    TCO2 36 (*)    Acid-Base Excess 7.0 (*)    All other components within normal limits  TROPONIN I (HIGH SENSITIVITY) - Abnormal; Notable for the following components:   Troponin  I (High Sensitivity) 31 (*)    All other components within normal limits  SARS CORONAVIRUS 2 BY RT PCR  TROPONIN I (HIGH SENSITIVITY)    EKG EKG Interpretation  Date/Time:  Saturday January 26 2022 20:33:10 EDT Ventricular Rate:  103 PR Interval:  182 QRS Duration: 80 QT Interval:  342 QTC Calculation: 448 R Axis:   87 Text Interpretation: Sinus tachycardia with Premature atrial complexes ST & T wave abnormality, consider inferolateral ischemia No significant change since last tracing When compared with ECG of 17-Jan-2022 16:38, PREVIOUS ECG IS PRESENT Confirmed by Blanchie Dessert [phone]) on 01/26/2022 9:01:50 PM  Radiology DG Chest Port 1 View  Result Date: 01/26/2022 CLINICAL DATA:  Chest pain, shortness of breath EXAM: PORTABLE CHEST 1 VIEW COMPARISON:  01/17/2022 FINDINGS: Postsurgical changes in the right hemithorax with eventration of the right hemidiaphragm and small right pleural effusion (partially loculated), chronic. Left lower lobe scarring. No frank interstitial edema. No pleural effusion or pneumothorax. Cardiomegaly.  Postsurgical changes related to prior CABG. Median sternotomy. IMPRESSION: Postsurgical changes in the right hemithorax with small right pleural effusion (partially loculated), chronic. Electronically Signed   By: Julian Hy M.D.   On: 01/26/2022 21:17    Procedures Procedures    Medications Ordered in ED Medications  furosemide (LASIX) injection 40 mg (has no administration in time range)  LORazepam (ATIVAN) injection 1 mg (has no administration in time range)    ED Course/ Medical Decision Making/ A&P                           Medical Decision Making Amount and/or Complexity of Data Reviewed Independent Historian: spouse External Data Reviewed: notes.    Details: recent hospitalization Labs: ordered. Decision-making details documented in ED Course. Radiology: ordered and independent interpretation performed. Decision-making details  documented in ED Course. ECG/medicine tests: ordered  and independent interpretation performed. Decision-making details documented in ED Course.  Risk Prescription drug management. Decision regarding hospitalization.   Pt with multiple medical problems and comorbidities and presenting today with a complaint that caries a high risk for morbidity and mortality.  Here today with worsening shortness of breath.  Patient is on chronic oxygen at home and has multiple possibilities for his shortness of breath.  CHF exacerbation, COPD exacerbation, pneumonia and MI are of highest concern.  Lower suspicion for PE as patient is anticoagulated.   On exam patient is tachypneic, in mild respiratory distress but sats are maintained at 5 L of oxygen.  Patient has rhonchi diffusely but does not have specific wheezing.  Labs are pending, x-ray is pending.  I independently interpreted patient's EKG which shows sinus tachycardia with occasional PVCs but no acute changes.  Patient was placed on BiPAP due to his work of breathing.  Will reassess.  10:25 PM Patient was on BiPAP for short time but then pulled it off because he reports it was uncomfortable and causing him to be anxious.  Patient is currently on his nasal cannula oxygen but still has diffuse rhonchi.   I independently interpreted patient's labs today and VBG was normal with normal pH and CO2, BNP is elevated at 1300 which is similar to when he was admitted last time for CHF exacerbation, CBC without acute findings, CMP with mild AKI with creatinine of 1.4 today but normal LFTs.  Troponin mildly elevated at 31. I have independently visualized and interpreted pt's images today.  Chest x-ray with evidence of a right-sided pleural effusion and some pulmonary edema.  Patient was given 40 IV Lasix here.  Suspect 20 mg of oral Lasix may not be enough for him and 25mg  of spironolactone.  Potassium levels are normal today and he did stop the oral potassium he was  discharged home with.  Based on patient's symptoms feel that he will need admission for further care.  Pt still tachypneic here will try some ativan and place bipap back on   CRITICAL CARE Performed by: Merian Wroe Total critical care time: 40 minutes Critical care time was exclusive of separately billable procedures and treating other patients. Critical care was necessary to treat or prevent imminent or life-threatening deterioration. Critical care was time spent personally by me on the following activities: development of treatment plan with patient and/or surrogate as well as nursing, discussions with consultants, evaluation of patient's response to treatment, examination of patient, obtaining history from patient or surrogate, ordering and performing treatments and interventions, ordering and review of laboratory studies, ordering and review of radiographic studies, pulse oximetry and re-evaluation of patient's condition.          Final Clinical Impression(s) / ED Diagnoses Final diagnoses:  Acute on chronic congestive heart failure, unspecified heart failure type [facility])  Respiratory distress    Rx / DC Orders ED Discharge Orders     None         Blanchie Dessert, MD 01/26/22 2225

## 2022-01-26 NOTE — Assessment & Plan Note (Signed)
Continue home medications Klonopin 0.5 mg nightly Cymbalta 60 mg a day Seroquel 150 mg nightly

## 2022-01-26 NOTE — Assessment & Plan Note (Signed)
this patient has acute respiratory failure with Hypoxia   as documented by the presence of following: O2 saturatio< 90% on RA Likely due to: , CHF exacerbation, COPD exacerbation,   Provide O2 therapy and titrate as needed  Continuous pulse ox   check Pulse ox with ambulation prior to discharge   may need  TC consult for home O2 set up   Prone if able  flutter valve ordered

## 2022-01-26 NOTE — Assessment & Plan Note (Signed)
Chronic stable check lipid panel

## 2022-01-26 NOTE — H&P (Signed)
[name]:[id number] DOB: Jul 11, 1940 DOA: 01/26/2022     PCP: Ronnell Freshwater, NP   Outpatient Specialists:   CARDS:  Amy NP    Pulmonary Eric Form PA Chesley Mires, MD      Patient arrived to ER on 01/26/22 at 2017 Referred by Attending Blanchie Dessert, MD   Patient coming from:    home Lives With family    Chief Complaint:   Chief Complaint  Patient presents with   Chest Pain   Shortness of Breath    Hx. CHF    HPI: John Ball is a 81 y.o. male with medical history significant of combined systolic diastolic CHF, paroxysmal atrial fibrillation, ongoing tobacco abuse, COPD on chronic oxygen 4 L, CAD, PAD status post bypass, carcinoid tumor of the lung    Presented with   shortness of breath chest pressure Presents with worsening chest pain shortness of breath and chest congestion started today no associated nausea or vomiting he does have known history of CHF CAD and COPD As well as history of carcinoid tumor of the lung just got discharged on 15 July after CHF exacerbation ED echogram at that time showed EF 30-45% at baseline he is on 4 L of oxygen Per wife he continues to take Lasix 20 mg/day no change in his weight. Has been having some and worsening cough shortness of breath they bumped him up to 8 L of oxygen.  Cough is productive of white sputum reports some chills but no fever has been getting much worse over the past 2 days Try to use breathing treatments at home but did not help Describes, pleuritic component to his chest pain   Still smokes  Initial COVID TEST   in house  PCR testing  Pending  Lab Results  Component Value Date   [place] NEGATIVE 01/17/2022   [place] NEGATIVE 09/12/2021   [place] NEGATIVE 07/13/2021     Regarding pertinent Chronic problems:     Hyperlipidemia - not on statins   Lipid Panel     Component Value Date/Time   CHOL 190 08/20/2021 0000   TRIG 107 08/20/2021 0000   HDL 53 08/20/2021 0000    CHOLHDL 3.6 08/20/2021 0000   CHOLHDL 4.9 07/22/2016 0456   VLDL 16 07/22/2016 0456   LDLCALC 118 08/20/2021 0000   LABVLDL 19 08/20/2021 0000     HTN on losartan spironolactone   chronic CHF diastolic/systolic/ combined -  Echo 08/2021 EF 20-25% RV severely reduced LA severely dilated, TV regurgitation moderate On lasix empagliflozin.    CAD  - On Aspirin, statin, betablocker, Plavix                 -  followed by cardiology          COPD -  followed by pulmonology on baseline oxygen  4L,      OSA -on nocturnal oxygen,  CPAP,       A. 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  Lab Results  Component Value Date   CREATININE 1.42 (H) 01/26/2022   CREATININE 1.16 01/19/2022   CREATININE 1.31 (H) 01/18/2022    Chronic anemia - baseline hg Hemoglobin & Hematocrit  Recent Labs    01/18/22 0432 01/26/22 2032 01/26/22 2128  HGB 11.5* 12.3* 13.6    While in ER:   CXR unchanged Placed on BIPAP but did not tolerate  Had to be given a dose of Ativan And put him back on BIPAP  Was given lasix 40 mg  CXR - Postsurgical changes in the right hemithorax with small right pleural effusion (partially loculated), chronic.     Following Medications were ordered in ER: Medications  furosemide (LASIX) injection 40 mg (40 mg Intravenous Given 01/26/22 2237)  LORazepam (ATIVAN) injection 1 mg (1 mg Intravenous Given 01/26/22 2230)       ED Triage Vitals  Enc Vitals Group     BP 01/26/22 2021 (!) 140/100     Pulse Rate 01/26/22 2021 (!) 116      Resp 01/26/22 2021 (!) 24     Temp 01/26/22 2021 98.7 F (37.1 C)     Temp Source 01/26/22 2021 Oral     SpO2 01/26/22 2021 99 %     Weight --      Height --      Head Circumference --      Peak Flow --      Pain Score 01/26/22 2027 8     Pain Loc --      Pain Edu? --      Excl. in [place]? --   TMAX(24)@     _________________________________________ Significant initial  Findings: Abnormal Labs Reviewed  CBC - Abnormal; Notable for the following components:      Result Value   RBC 3.41 (*)    Hemoglobin 12.3 (*)    MCV 115.0 (*)    MCH 36.1 (*)    All other components within normal limits  BRAIN NATRIURETIC PEPTIDE - Abnormal; Notable for the following components:   B Natriuretic Peptide 1,322.4 (*)    All other components within normal limits  COMPREHENSIVE METABOLIC PANEL - Abnormal; Notable for the following components:   Glucose, Bld 125 (*)    BUN 33 (*)    Creatinine, Ser 1.42 (*)    GFR, Estimated 51 (*)    All other components within normal limits  I-STAT VENOUS BLOOD GAS, ED - Abnormal; Notable for the following components:   pO2, Ven 61 (*)    Bicarbonate 34.4 (*)    TCO2 36 (*)    Acid-Base Excess 7.0 (*)    All other components within normal limits  TROPONIN I (HIGH SENSITIVITY) - Abnormal; Notable for the following components:   Troponin I (High Sensitivity) 31 (*)    All other components within normal limits     _________________________ Troponin 31 -  ECG: Ordered Personally reviewed and interpreted by me showing: HR : 103 Rhythm:Sinus tachycardia with Premature atrial complexes ST & T wave abnormality, consider inferolateral ischemia No significant change since last tracing QTC 447    The recent clinical data is shown below. Vitals:   01/26/22 2021 01/26/22 2130 01/26/22 2200  BP: (!) 140/100 127/79 127/80  Pulse: (!) 116 (!) 102 100  Resp: (!) 24 20 18   Temp: 98.7 F (37.1 C)    TempSrc: Oral  SpO2: 99% 97% 97%     WBC     Component  Value Date/Time   WBC 6.5 01/26/2022 2032   LYMPHSABS 0.8 01/17/2022 1644   LYMPHSABS 1.2 03/24/2019 0831   LYMPHSABS 1.6 05/02/2014 1156   MONOABS 0.4 01/17/2022 1644   MONOABS 0.4 05/02/2014 1156   EOSABS 0.1 01/17/2022 1644   EOSABS 0.2 03/24/2019 0831   BASOSABS 0.0 01/17/2022 1644   BASOSABS 0.0 03/24/2019 0831   BASOSABS 0.0 05/02/2014 1156         UA not ordered     Results for orders placed or performed during the hospital encounter of 01/26/22  SARS Coronavirus 2 by RT PCR (hospital order, performed in [facility] hospital lab) *cepheid single result test* Anterior Nasal Swab     Status: None   Collection Time: 01/26/22 10:28 PM   Specimen: Anterior Nasal Swab  Result Value Ref Range Status   SARS Coronavirus 2 by RT PCR NEGATIVE NEGATIVE Final           _______________________________________________ Hospitalist was called for admission for   Acute on chronic congestive heart failure, unspecified heart failure type      The following Work up has been ordered so far:  Orders Placed This Encounter  Procedures   SARS Coronavirus 2 by RT PCR (hospital order, performed in [place] hospital lab) *cepheid single result test* Anterior Nasal Swab   DG Chest Port 1 View   CBC   Brain natriuretic peptide   Comprehensive metabolic panel   Document Height and Actual Weight   Consult to hospitalist   Bipap   I-Stat venous blood gas, [facility] ED only)   ED EKG   EKG 12-Lead     OTHER Significant initial  Findings:  labs showing:    Recent Labs  Lab 01/26/22 2032 01/26/22 2128  NA 138 138  K 4.6 4.9  CO2 29  --   GLUCOSE 125*  --   BUN 33*  --   CREATININE 1.42*  --   CALCIUM 9.5  --     Cr     Up from baseline see below Lab Results  Component Value Date   CREATININE 1.42 (H) 01/26/2022   CREATININE 1.16 01/19/2022   CREATININE 1.31 (H) 01/18/2022    Recent Labs  Lab 01/26/22 2032  AST 25  ALT 26  ALKPHOS 59  BILITOT 0.3  PROT 6.9  ALBUMIN 4.0    Lab Results  Component Value Date   CALCIUM 9.5 01/26/2022          Plt: Lab Results  Component Value Date   PLT 198 01/26/2022       Venous  Blood Gas result:  pH   Latest Reference Range & Units 01/26/22 21:28  pH, Ven 7.25 - 7.43  7.376  pCO2, Ven 44 - 60 mmHg 58.6  pO2, Ven 32 - 45 mmHg 61 (H)  (H): Data is abnormally high  ABG    Component Value Date/Time   PHART 7.386 01/17/2022 1831   PCO2ART 52.3 (H) 01/17/2022 1831   PO2ART 152 (H) 01/17/2022 1831   HCO3 34.4 (H) 01/26/2022 2128   TCO2 36 (H) 01/26/2022 2128   O2SAT 90 01/26/2022 2128         Recent Labs  Lab 01/26/22 2032 01/26/22 2128  WBC 6.5  --   HGB 12.3* 13.6  HCT 39.2 40.0  MCV 115.0*  --   PLT 198  --     HG/HCT *  stable,  Down *Up from baseline see below    Component Value Date/Time   HGB 13.6 01/26/2022 2128   HGB 15.3 09/06/2021 1159   HGB 14.4 05/02/2014 1156   HCT 40.0 01/26/2022 2128   HCT 44.4 09/06/2021 1159   HCT 43.9 05/02/2014 1156   MCV 115.0 (H) 01/26/2022 2032   MCV 104 (H) 09/06/2021 1159   MCV 96.1 05/02/2014 1156      Cardiac Panel (last 3 results) No results for input(s): "CKTOTAL", "CKMB", "TROPONINI", "RELINDX" in the last 72 hours.  .car BNP (last 3 results) Recent Labs    09/12/21 1624 01/17/22 1644 01/26/22 2033  BNP 2,341.0* 1,222.6* 1,322.4*     DM  labs:  HbA1C: Recent Labs    08/20/21 0000  HGBA1C 6.0*       CBG (last 3)  No results for input(s): "GLUCAP" in the last 72 hours.        Cultures:    Component Value Date/Time   SDES WOUND LEFT GROIN 11/28/2017 1940   SPECREQUEST PATIENT ON VANCOMYCIN 11/28/2017 1940   CULT  11/28/2017 1940    FEW METHICILLIN RESISTANT STAPHYLOCOCCUS AUREUS NO ANAEROBES ISOLATED Performed at [facility], [place] [address], [place], [place] [address]    REPTSTATUS 12/04/2017 FINAL 11/28/2017 1940     Radiological Exams on Admission: DG Chest Port 1 View  Result Date: 01/26/2022 CLINICAL  DATA:  Chest pain, shortness of breath EXAM: PORTABLE CHEST 1 VIEW COMPARISON:  01/17/2022 FINDINGS: Postsurgical changes in the right hemithorax with eventration of the right hemidiaphragm and small right pleural effusion (partially loculated), chronic. Left lower lobe scarring. No frank interstitial edema. No pleural effusion or pneumothorax. Cardiomegaly.  Postsurgical changes related to prior CABG. Median sternotomy. IMPRESSION: Postsurgical changes in the right hemithorax with small right pleural effusion (partially loculated), chronic. Electronically Signed   By: Julian Hy M.D.   On: 01/26/2022 21:17   _______________________________________________________________________________________________________ Latest  Blood pressure 127/80, pulse 100, temperature 98.7 F (37.1 C), temperature source Oral, resp. rate 18, SpO2 97 %.   Vitals  labs and radiology finding personally reviewed  Review of Systems:    Pertinent positives include:  fatigue, chest pain,   Constitutional:  No weight loss, night sweats, Fevers, chills, weight loss  HEENT:  No headaches, Difficulty swallowing,Tooth/dental problems,Sore throat,  No sneezing, itching, ear ache, nasal congestion, post nasal drip,  Cardio-vascular:  No Orthopnea, PND, anasarca, dizziness, palpitations.no Bilateral lower extremity swelling  GI:  No heartburn, indigestion, abdominal pain, nausea, vomiting, diarrhea, change in bowel habits, loss of appetite, melena, blood in stool, hematemesis Resp:  no shortness of breath at rest. No dyspnea on exertion, No excess mucus, no productive cough, No non-productive cough, No coughing up of blood.No change in color of mucus.No wheezing. Skin:  no rash or lesions. No jaundice GU:  no dysuria, change in color of urine, no urgency or frequency. No straining to urinate.  No flank pain.  Musculoskeletal:  No joint pain or no joint swelling. No decreased range of motion. No back pain.  Psych:   No change in mood or affect. No depression or anxiety. No memory loss.  Neuro: no localizing neurological complaints, no tingling, no weakness, no double vision, no gait abnormality, no slurred speech, no confusion  All systems reviewed and apart from [place] all are negative _______________________________________________________________________________________________ Past Medical History:   Past Medical History:  Diagnosis Date   Anxiety    Arthritis    Complication of anesthesia  hx prolonged post op oxygen dependent/  hx hallucinations for 2 day post op lobectomy 2012   COPD, severe (HCC)    Depression    Essential hypertension    Hearing loss of both ears    does wear his hearing aids   History of arterial bypass of lower extremity    RIGHT FEM-POP   History of CHF (congestive heart failure)    History of DVT of lower extremity    History of panic attacks    Hyperlipidemia with target LDL less than 70    Left main coronary artery disease 2004   a) Severe LM CAD 2004 -->s/p  cabg x4 (LIMA-LAD, free RIMA-RI, SVG-OM 2, SVG-RPDA); b) 06/2014: Abnormal Myoview --> c)cardiac Cath Jan 2016: pLM 70%, pLAD 40-50%- patent LIMA-LAD.  RI -competitive flow noted from RIMA-RI graft, circumflex, normal caliber with moderate OM1.  OM 2 occluded.  SVG-OM 2 patent. 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    does not wear his dentures   Nocturia    OSA (obstructive sleep apnea)    uses O2  via   --  intolerate to CPAP   Peripheral arterial occlusive disease ([place])    2011   a) Gore-Tex graft right AK popA-BK popA --> b) 2012: thrombosed graft --> thrombectomy with dacron patch = 1 V runnof via Peroneal; c) 01/2014: R femoropopliteal bypass with left femoral vein;; followed by dr Early--  per last dopplers 06-28-2014 no change right graft,  50-74% stenosis common and left mid superficial femoral artery   PTSD  (post-traumatic stress disorder)    anxiety attack's---  [place] Vet   Recurrent productive cough    SMOKER'S COUGH   S/P CABG x 4 07/2002   a) LIMA-LAD, freeRIMA-RI, SVG-OM2, SVG-RPDA ; --> December 2015 Myoview with mostly fixed inferior defect --> cardiac cath January 2016 revealed CTO of native RCA and SVG RCA.  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  Non-small cell--- a) 04/2011: R L Lobectomy & med node dissection ([id number]) - [facility] [place], [place]) w/o post-op Rx; b) CT-A Chest Jan 2013: R pl effusion, R hilar LAN (3.3 cm x 2.8 cm & 2.2 cm x 1.3 cm); c) 2/'13 PET-CT Chest: Bilat Hilar LAN, no distant Mets  d) CT chest 9/'14: Stable shotty hilar nodes bilaterally w/ no pathologic sized LAD or suspicious Pulm nodule; ;;  (oncologist at   Wears glasses       Past Surgical History:  Procedure Laterality Date   ABDOMINAL AORTOGRAM N/A 11/12/2017   Procedure: ABDOMINAL AORTOGRAM;  Surgeon: Waynetta Sandy, MD;  Location: [place] [facility];  Service: Cardiovascular;  Laterality: N/A;   ABDOMINAL AORTOGRAM W/LOWER EXTREMITY Left 02/10/2018   Procedure: ABDOMINAL AORTOGRAM W/LOWER EXTREMITY;  Surgeon: Serafina Mitchell, MD;  Location: [place] [facility];  Service: Cardiovascular;  Laterality: Left;   APPLICATION OF WOUND VAC Left 12/03/2017   Procedure: EXCHANGE OF WOUND VAC LEFT GROIN,;  Surgeon: Rosetta Posner, MD;  Location: [facility];  Service: Vascular;  Laterality: Left;   CARDIAC CATHETERIZATION  07/13/2002   Ischemia RCA region on Cardiolite// 60-70% ostial left main, 50% midCx, 60-70% mid RI,  70% mRCA, 90% JeNu and crux 75%  RCA ,  95% PLA//  severe LM and 3Vessel CAD, perserved LV, ef 60%   COLONOSCOPY  2008    WNL   CORONARY ARTERY BYPASS GRAFT  07/27/2002   LIMA-LAD, freeRIMA-RI,SVG-OM2, SVG-rPDA; Dr. Roxan Hockey   CYSTOSCOPY W/  URETERAL STENT PLACEMENT N/A 02/21/2015   Procedure: BILATERAL RETROGRADE PYELOGRAM;  Surgeon: Festus Aloe, MD;  [facility];  Service: Urology;  Laterality: N/A;   ENDARTERECTOMY FEMORAL Left 11/17/2017   Procedure: LEFT Femoral Endarterectomy with Patch Angioplasty;  Surgeon: Rosetta Posner, MD;  Location: [place];  Service: Vascular;  Laterality: Left;   EYE SURGERY Left 05/2012   FEMORAL ARTERY - POPLITEAL ARTERY BYPASS GRAFT Right 10-24-2008   dr early   w/ GoreTex graft   FEMORAL-POPLITEAL BYPASS GRAFT Left 02/11/2018   Procedure: BYPASS GRAFT LOWER EXTREMITY;  Surgeon: Rosetta Posner, MD;  Location: [facility];  Service: Vascular;  Laterality: Left;   FEMORAL-TIBIAL BYPASS GRAFT Right 01/10/2014   Procedure: Right Femoral to Posterior Tibial Bypass Graft using Left Leg Vein, Thrombectomy Right Common Femoral and Profunda of Leg . ;  Surgeon: Rosetta Posner, MD;  Location: [place];  Service: Vascular;  Laterality: Right;   HYDROCELE EXCISION Left    LEFT ANKLE SURGERY  1990's   LEFT HEART CATHETERIZATION WITH CORONARY/GRAFT ANGIOGRAM N/A 07/12/2014   Procedure: LEFT HEART CATHETERIZATION WITH Beatrix Fetters;  Surgeon: Troy Sine, MD;  Location: [facility];  Service: Cardiovascular;  Laterality: N/A;  pLM 70%, pLAD 40-50%- patent LIMA-LAD.  RI -competitive flow noted from fRIMA-RI graft, circumflex, normal caliber with moderate OM1.  OM 2 occluded.  SVG-OM 2 patent. RCA CTO after large RVM, faint R-R and LAD septal-PDA collateral   LOWER EXTREMITY ANGIOGRAPHY Bilateral 11/12/2017   Procedure: LOWER EXTREMITY ANGIOGRAPHY - Left Lower;  Surgeon: Waynetta Sandy, MD;  Location: [place] [facility];  Service: Cardiovascular;  Laterality: Bilateral;   NM MYOVIEW LTD  11/'15; 3/'19, 5/'20   a) Mod sized, mod intensity/partially reversible inferior defect c/w  prior infarct mild/mod peri-infarct ischemia.  Inferoseptal HK.  INTERMEDIATE RISK. --> Cath showed  CTO of Native RCA & SVG-rPDA.; b) EF ~42%. Inferior-Inferoseptal defect - likely old scar - INTERMEDIATE RISK b/c low EF (Echo EF 50-55%) - med Rx; c) [place] -by report - Non-ischemic.   PERIPHERAL VASCULAR INTERVENTION Left 02/10/2018   Procedure: PERIPHERAL VASCULAR INTERVENTION;  Surgeon: Serafina Mitchell, MD;  Location: [place] [facility];  Service: Cardiovascular;  Laterality: Left;  left common iliac stent, attempted left SFA stent   RIGHT ABOVE KNEE POPLITEAL GRAFT TO BELOW KNEE POPLITEAL ARTERY BYPASS WITH REVERSE SAPHENOUS VEIN  03/02/2010   RIGHT/LEFT HEART CATH AND CORONARY/GRAFT ANGIOGRAPHY N/A 09/12/2021   Procedure: RIGHT/LEFT HEART CATH AND CORONARY/GRAFT ANGIOGRAPHY;  Surgeon: Larey Dresser, MD;  Location: [place] [facility];  Service: Cardiovascular;  Laterality: N/A;   THROMBECTOMY OF RIGHT FEMORAL TO ABOVE KNEE POPLITEAL GORETEX GRAFT ANGIOPLASTY OF GORETEX AND SAPHENOUS VEIN JUNCTION AND ABOVE KNEE POPLITEAL ARTERY   10-06-2010   DR EARLY   TONSILLECTOMY     TRANSTHORACIC ECHOCARDIOGRAM  09/2017   EF 50-55%.  More accurate - Mild inferolateral HK (c/w known RCA occlusion).  GR 1 DD.  Mild LA dilation mildly increased PA pressures with mild RV/RA dilation.   TRANSURETHRAL RESECTION OF BLADDER TUMOR N/A 02/21/2015   Procedure: TRANSURETHRAL RESECTION OF BLADDER TUMOR (TURBT);  Surgeon: Festus Aloe, MD;  Location: [facility];  Service: Urology;  Laterality: N/A;   VIDEO ASSISTED THORACOSCOPY (VATS)/WEDGE RESECTION Right 05-01-2011    [place]   LOWER LOBECTOMY W/  NODE DISSECTION   WOUND EXPLORATION Left 11/28/2017   Procedure: WOUND EXPLORATION GROIN, WASHOUT AND WOUND VAC APPLICATION;  Surgeon: Conrad [place], MD;  Location: [place];  Service: Vascular;  Laterality: Left;    Social History:  Ambulatory  walker       reports that he has been smoking cigarettes. He started smoking about 64 years ago. He has a 60.00 pack-year smoking history. He has  never used smokeless tobacco. He reports that he does not drink alcohol and does not use drugs.     Family History:    Family History  Problem Relation Age of Onset   Other Mother 82       Abdominal Aortic Aneurysm    Stroke Mother    Alzheimer's disease Father 88   Stroke Father    ______________________________________________________________________________________________ Allergies: Allergies  Allergen Reactions   Aspirin Other (See Comments)    Avoids due to severe bruises   Crestor [Rosuvastatin Calcium] Other (See Comments)    myalgia   Lipitor [Atorvastatin Calcium] Other (See Comments)    myalgia   Zetia [Ezetimibe] Other (See Comments)    myalgia   Zocor [Simvastatin - High Dose] Other (See Comments)    myalgia   Atorvastatin Itching    Other reaction(s): Joint swelling Other reaction(s): Joint swelling   Atorvastatin Calcium Itching and Swelling    Joint swelling   Aripiprazole Other (See Comments)    Altered mental status   Effexor [Venlafaxine Hydrochloride] Itching   Morphine And Related Itching     Prior to Admission medications   Medication Sig Start Date End Date Taking? Authorizing Provider  acetaminophen (TYLENOL) 500 MG tablet Take 1,000 mg by mouth every 6 (six) hours as needed for moderate pain or headache.    Yes [provider]  albuterol (ACCUNEB) 1.25 MG/3ML nebulizer solution Take 1 ampule by nebulization every 6 (six) hours as needed for wheezing. 2.5 mg/3 ml   Yes [provider]  albuterol (PROVENTIL HFA;VENTOLIN HFA) 108 (90 BASE) MCG/ACT inhaler Inhale 1 puff into the lungs every 6 (six) hours as needed for wheezing or shortness of breath.   Yes [provider]  apixaban (ELIQUIS) 5 MG TABS tablet Take 1 tablet (5 mg total) by mouth 2 (two) times daily. 09/18/21  Yes Larey Dresser, MD  cholecalciferol (VITAMIN D) 25 MCG (1000 UNIT) tablet Take 1,000 Units by mouth in the morning. 04/20/21  Yes [provider]  clonazePAM (KLONOPIN) 0.25 MG disintegrating tablet Take 1 tablet (0.25 mg total) by mouth at bedtime. Patient taking differently: Take 0.5 mg by mouth at bedtime. 01/19/22 02/18/22 Yes Arrien, Jimmy Picket, MD  CYMBALTA 60 MG capsule Take 60 mg by mouth in the morning. 10/31/19  Yes [provider]  digoxin (LANOXIN) 0.125 MG tablet Take 0.125 mg by mouth daily.   Yes [provider]  furosemide (LASIX) 20 MG tablet Take 1 tablet (20 mg total) by mouth daily. Please increase to 40 mg daily in case of weight increase 2 to 3 lbs in 24 hrs or 5 lbs in 7 days. 01/20/22  Yes Arrien, Jimmy Picket, MD  ibuprofen (ADVIL) 200 MG tablet Take 400 mg by mouth every 6 (six) hours as needed for moderate pain.   Yes [provider]  losartan (COZAAR) 25 MG tablet Take 0.5 tablets (12.5 mg total) by mouth  at bedtime. 11/21/21  Yes [place], Maricela Bo, FNP  nitroGLYCERIN (NITROSTAT) 0.4 MG SL tablet Place 1 tablet (0.4 mg total) under the tongue every 5 (five) minutes as needed for chest pain. 08/28/17  Yes Leonie Man, MD  OXYGEN Inhale 5 L into the lungs continuous.   intolerant to CPAP   Yes [provider]  potassium chloride (KLOR-CON M) 10 MEQ tablet Take 1 tablet (10 mEq total) by mouth daily. 01/19/22 02/18/22 Yes Arrien, Jimmy Picket, MD  prazosin (MINIPRESS) 1 MG capsule Take 1 mg by mouth at bedtime.   Yes [provider]  QUEtiapine Fumarate (SEROQUEL XR) 150 MG 24 hr tablet Take 150 mg by mouth at bedtime.   Yes [provider]  spironolactone (ALDACTONE) 25 MG tablet Take 0.5 tablets (12.5 mg total) by mouth daily. 11/09/21  Yes [place], Maricela Bo, FNP  Tiotropium Bromide Monohydrate 2.5 MCG/ACT AERS Inhale 2 puffs into the lungs in the morning.   Yes [provider]  traZODone (DESYREL) 100 MG tablet Take 100 mg by mouth at bedtime.   Yes [provider]  [place] Ruts INHUB 250-50 MCG/ACT AEPB Inhale 1 puff into the  lungs 2 (two) times daily. 07/25/21  Yes [provider]  empagliflozin (JARDIANCE) 10 MG TABS tablet Take 1 tablet (10 mg total) by mouth daily. 09/18/21   Larey Dresser, MD  torsemide (DEMADEX) 20 MG tablet Take 10 mg by mouth daily.    [provider]    ___________________________________________________________________________________________________ Physical Exam:    01/26/2022   10:00 PM 01/26/2022    9:30 PM 01/26/2022    8:21 PM  Vitals with BMI  Systolic [phone]  Diastolic 80 79 270  Pulse 100 102 116     1. General:  in No  Acute distress    Chronically ill   -appearing 2. Psychological: Alert and   Oriented 3. Head/ENT:     Dry Mucous Membranes                          Head Non traumatic, neck supple                           Poor Dentition 4. SKIN: normal  Skin turgor,  Skin clean Dry and intact no rash 5. Heart: Regular rate and rhythm no  Murmur, no Rub or gallop 6. Lungs:  no wheezes some crackles   7. Abdomen: Soft,  non-tender, Non distended  bowel sounds present 8. Lower extremities: no clubbing, cyanosis, no  edema 9. Neurologically Grossly intact, moving all 4 extremities equally   10. MSK: Normal range of motion    Chart has been reviewed  ______________________________________________________________________________________________  Assessment/Plan  81 y.o. male with medical history significant of combined systolic diastolic CHF, paroxysmal atrial fibrillation, ongoing tobacco abuse, COPD on chronic oxygen 4 L, CAD, PAD status post bypass, carcinoid tumor of the lung    Admitted for   Acute on chronic congestive heart failure, unspecified heart failure type      Present on Admission:  Acute on chronic combined systolic and diastolic CHF (congestive heart failure) ([place])  Chronic obstructive pulmonary disease (HCC)  Essential hypertension  Atrial fibrillation, chronic (HCC)  Hyperlipidemia with target LDL less than 70  OSA  (obstructive sleep apnea)  Acute on chronic respiratory failure with hypoxia (HCC)  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 Last BNP BNP (last 3 results) Recent Labs    09/12/21 1624 01/17/22 1644 01/26/22 2033  BNP 2,341.0* 1,222.6* 1,322.4*      diurese with IV lasix and monitor orthostatics and creatinine to avoid over diuresis. Had echo 2 wks ago will not repeat  ACE/ARBi  Contraindicated    cardiology consulted    Chronic obstructive pulmonary disease ([place]) COPD could be contributing to current respiratory failure although no wheezing noted Hold off on steroids for tonight Obtain respiratory panel VBG showed well compensated Patient is on BiPAP for increased work of breathing we will continue for now   Essential hypertension Given worsening AKI hold losartan for now if BP allows restart Minipress  Atrial fibrillation, chronic ([place]) Occasionally patient has heart rate going up about 100 Continue amiodarone check digoxin level If uncontrolled A-fib persists and becomes severe and symptomatic can try amiodarone bolus as per cardiology  Hyperlipidemia with target LDL less than 70 Chronic stable check lipid panel  OSA (obstructive sleep apnea) For now on BiPAP continue to monitor if respirations improved may need to switch back to CPAP nightly  Acute on chronic respiratory failure with hypoxia (HCC)  this patient has acute respiratory failure with Hypoxia   as  documented by the presence of following: O2 saturatio< 90% on RA Likely due to: , CHF exacerbation, COPD exacerbation,   Provide O2 therapy and titrate as needed  Continuous pulse ox   check Pulse ox with ambulation prior to discharge   may need  TC consult for home O2 set up   Prone if able  flutter valve ordered   Malignant neoplasm of bronchus and lung ([place]) Need to follow-up as an outpatient plan was to repeat PET scan  Tobacco abuse  - Spoke about importance of quitting spent 5 minutes discussing options for treatment, prior attempts at quitting, and dangers of smoking  -At this point patient is  interested in quitting  - order nicotine patch   - nursing tobacco cessation protocol   Bipolar I disorder ([place]) Continue home medications Klonopin 0.5 mg nightly Cymbalta 60 mg a day Seroquel 150 mg nightly  Pleural effusion Loculated patient would probably will benefit from further imaging To evaluate severity of effusion and if it needs to be addressed may need IR consult versus CT surgery if severe and contributing to hypoxia    Other plan as per orders.  DVT prophylaxis:  on eliquis    Code Status:    Code Status: Prior FULL CODE   as per patient   I had personally discussed CODE STATUS with patient     Family Communication:   Family not at  Bedside    Disposition Plan:      To home once workup is complete and patient is stable   Following barriers for discharge:  Will need consultants to evaluate patient prior to discharge                        Would benefit from PT/OT eval prior to DC  Ordered                   Swallow eval - SLP ordered                                       Transition of care consulted                   Nutrition    consulted                                                    Consults called: cardiology is aware  Admission status:  ED Disposition     ED Disposition  Admit    Condition  --   [place]: [place] [[id number]]  Level of Care: Progressive [102]  Admit to Progressive based on following criteria: RESPIRATORY PROBLEMS hypoxemic/hypercapnic respiratory failure that is responsive to NIPPV (BiPAP) or High Flow Nasal Cannula (6-80 lpm). Frequent assessment/intervention, no > Q2 hrs < Q4 hrs, to maintain oxygenation and pulmonary hygiene.  May admit patient to [facility] or [facility] if equivalent level of care is available:: No  Covid Evaluation: Asymptomatic - no recent exposure (last 10 days) testing not required  Diagnosis: Acute on chronic combined systolic and diastolic CHF (congestive heart failure) [facility]) [[id number]]  Admitting Physician: Toy Baker [[id number]]  Attending Physician: Toy Baker [[id number]]  Certification:: I certify this patient will need inpatient services for at least 2 midnights  Estimated Length of Stay: 2            inpatient     I Expect 2 midnight stay secondary to severity of patient's current illness need for inpatient interventions justified by the following:  hemodynamic instability despite optimal treatment (tachycardia * )  Severe lab/radiological/exam abnormalities including:    Hypoxia, CHF and extensive comorbidities including:     CHF  CAD    COPD/asthma  malignancy,   Chronic anticoagulation  That are currently affecting medical management.   I expect  patient to be hospitalized for 2 midnights requiring inpatient medical care.  Patient is at high risk for adverse outcome (such as loss of life or disability) if not treated.  Indication for inpatient stay as follows:    Need for operative/procedural  intervention New or worsening hypoxia   Need for  need for biPAP    Level of care       progressive tele indefinitely please discontinue once patient no longer qualifies COVID-19 Labs    Lab Results  Component Value Date   [place] 01/26/2022      Precautions: admitted as   Covid Negative   Salena Ortlieb 01/26/2022, 11:49 PM    [facility]     after 2 AM please page floor coverage PA If 7AM-7PM, please contact the day team taking care of the patient using [email or web address]   Patient was evaluated in the context of the global COVID-19 pandemic, which necessitated consideration that the patient might be at risk  for infection with the SARS-CoV-2 virus that causes COVID-19. Institutional protocols and algorithms that pertain to the evaluation of patients at risk for COVID-19 are in a state of rapid change based on information released by regulatory bodies including the CDC and federal and state organizations. These policies and algorithms were followed during the patient's care.

## 2022-01-26 NOTE — Assessment & Plan Note (Signed)
Need to follow-up as an outpatient plan was to repeat PET scan

## 2022-01-26 NOTE — Subjective & Objective (Signed)
Presents with worsening chest pain shortness of breath and chest congestion started today no associated nausea or vomiting he does have known history of CHF CAD and COPD As well as history of carcinoid tumor of the lung just got discharged on 15 July after CHF exacerbation ED echogram at that time showed EF 30-45% at baseline he is on 4 L of oxygen Per wife he continues to take Lasix 20 mg/day no change in his weight. Has been having some and worsening cough shortness of breath they bumped him up to 8 L of oxygen.  Cough is productive of white sputum reports some chills but no fever has been getting much worse over the past 2 days Try to use breathing treatments at home but did not help Describes, pleuritic component to his chest pain

## 2022-01-26 NOTE — Assessment & Plan Note (Signed)
For now on BiPAP continue to monitor if respirations improved may need to switch back to CPAP nightly

## 2022-01-26 NOTE — Assessment & Plan Note (Signed)
Loculated patient would probably will benefit from further imaging To evaluate severity of effusion and if it needs to be addressed may need IR consult versus CT surgery if severe and contributing to hypoxia

## 2022-01-26 NOTE — Assessment & Plan Note (Signed)
Given worsening AKI hold losartan for now if BP allows restart Minipress

## 2022-01-26 NOTE — Assessment & Plan Note (Signed)
COPD could be contributing to current respiratory failure although no wheezing noted Hold off on steroids for tonight Obtain respiratory panel VBG showed well compensated Patient is on BiPAP for increased work of breathing we will continue for now

## 2022-01-26 NOTE — Assessment & Plan Note (Signed)
Occasionally patient has heart rate going up about 100 Continue amiodarone check digoxin level If uncontrolled A-fib persists and becomes severe and symptomatic can try amiodarone bolus as per cardiology

## 2022-01-26 NOTE — Assessment & Plan Note (Signed)
-   Spoke about importance of quitting spent 5 minutes discussing options for treatment, prior attempts at quitting, and dangers of smoking ? -At this point patient is    interested in quitting ? - order nicotine patch  ? - nursing tobacco cessation protocol ? ?

## 2022-01-26 NOTE — Progress Notes (Signed)
Pt on Bipap for approximately 20 minutes before removing it. Pt on 4L [place] resting comfortably at this time. BBS CRS SpO2 99% RR 18

## 2022-01-26 NOTE — Assessment & Plan Note (Addendum)
-   Pt diagnosed with CHF based on presence of the following  Pulmonary edema on CXR,  DOE  hepatomegaly, pleural effusion  With noted response to IV diuretic in ER  admit on telemetry,  cycle cardiac enzymes, Troponin   obtain serial ECG  to evaluate for ischemia as a cause of heart failure  monitor daily weight: There were no vitals filed for this visit. Last BNP BNP (last 3 results) Recent Labs    09/12/21 1624 01/17/22 1644 01/26/22 2033  BNP 2,341.0* 1,222.6* 1,322.4*      diurese with IV lasix and monitor orthostatics and creatinine to avoid over diuresis. Had echo 2 wks ago will not repeat  ACE/ARBi  Contraindicated    cardiology consulted

## 2022-01-27 ENCOUNTER — Encounter (HOSPITAL_COMMUNITY): Payer: Self-pay | Admitting: Internal Medicine

## 2022-01-27 DIAGNOSIS — J9621 Acute and chronic respiratory failure with hypoxia: Secondary | ICD-10-CM | POA: Diagnosis not present

## 2022-01-27 DIAGNOSIS — I482 Chronic atrial fibrillation, unspecified: Secondary | ICD-10-CM | POA: Diagnosis not present

## 2022-01-27 DIAGNOSIS — J449 Chronic obstructive pulmonary disease, unspecified: Secondary | ICD-10-CM | POA: Diagnosis not present

## 2022-01-27 DIAGNOSIS — I5043 Acute on chronic combined systolic (congestive) and diastolic (congestive) heart failure: Secondary | ICD-10-CM | POA: Diagnosis not present

## 2022-01-27 DIAGNOSIS — F319 Bipolar disorder, unspecified: Secondary | ICD-10-CM | POA: Diagnosis not present

## 2022-01-27 LAB — COMPREHENSIVE METABOLIC PANEL
ALT: 22 U/L (ref 0–44)
AST: 24 U/L (ref 15–41)
Albumin: 3.9 g/dL (ref 3.5–5.0)
Alkaline Phosphatase: 56 U/L (ref 38–126)
Anion gap: 9 (ref 5–15)
BUN: 31 mg/dL — ABNORMAL HIGH (ref 8–23)
CO2: 31 mmol/L (ref 22–32)
Calcium: 9.5 mg/dL (ref 8.9–10.3)
Chloride: 98 mmol/L (ref 98–111)
Creatinine, Ser: 1.25 mg/dL — ABNORMAL HIGH (ref 0.61–1.24)
GFR, Estimated: 59 mL/min — ABNORMAL LOW (ref >60)
Glucose, Bld: 139 mg/dL — ABNORMAL HIGH (ref 70–99)
Potassium: 4.1 mmol/L (ref 3.5–5.1)
Sodium: 138 mmol/L (ref 135–145)
Total Bilirubin: 0.5 mg/dL (ref 0.3–1.2)
Total Protein: 6.7 g/dL (ref 6.5–8.1)

## 2022-01-27 LAB — RESPIRATORY PANEL BY PCR

## 2022-01-27 LAB — CBC
HCT: 37.1 % — ABNORMAL LOW (ref 39.0–52.0)
Hemoglobin: 11.9 g/dL — ABNORMAL LOW (ref 13.0–17.0)
MCH: 36.2 pg — ABNORMAL HIGH (ref 26.0–34.0)
MCHC: 32.1 g/dL (ref 30.0–36.0)
MCV: 112.8 fL — ABNORMAL HIGH (ref 80.0–100.0)
Platelets: 179 10*3/uL (ref 150–400)
RBC: 3.29 MIL/uL — ABNORMAL LOW (ref 4.22–5.81)
RDW: 12.7 % (ref 11.5–15.5)
WBC: 5.9 10*3/uL (ref 4.0–10.5)
nRBC: 0 % (ref 0.0–0.2)

## 2022-01-27 LAB — PHOSPHORUS
Phosphorus: 4 mg/dL (ref 2.5–4.6)
Phosphorus: 3.9 mg/dL (ref 2.5–4.6)

## 2022-01-27 LAB — MAGNESIUM
Magnesium: 2.2 mg/dL (ref 1.7–2.4)
Magnesium: 2.1 mg/dL (ref 1.7–2.4)

## 2022-01-27 LAB — DIGOXIN LEVEL: Digoxin Level: <0.2 ng/mL — ABNORMAL LOW (ref 0.8–2.0)

## 2022-01-27 LAB — CK: Total CK: 43 U/L — ABNORMAL LOW (ref 49–397)

## 2022-01-27 LAB — ETHANOL: Alcohol, Ethyl (B): <10 mg/dL (ref <10)

## 2022-01-27 LAB — TROPONIN I (HIGH SENSITIVITY)
Troponin I (High Sensitivity): 30 ng/L — ABNORMAL HIGH (ref <18)
Troponin I (High Sensitivity): 37 ng/L — ABNORMAL HIGH (ref <18)

## 2022-01-27 LAB — TSH: TSH: 3.419 u[IU]/mL (ref 0.350–4.500)

## 2022-01-27 MED ORDER — SPIRONOLACTONE 12.5 MG HALF TABLET
12.5000 mg | ORAL_TABLET | Freq: Every day | ORAL | Status: DC
  Administered 2022-01-27 – 2022-01-28 (×2): 12.5 mg via ORAL
  Filled 2022-01-27 (×2): qty 1

## 2022-01-27 MED ORDER — ENSURE ENLIVE PO LIQD: 237.0000 mL | Freq: Two times a day (BID) | ORAL | Status: DC

## 2022-01-27 MED ORDER — LOSARTAN POTASSIUM 25 MG PO TABS
12.5000 mg | ORAL_TABLET | Freq: Every day | ORAL | Status: DC
  Administered 2022-01-27: 12.5 mg via ORAL
  Filled 2022-01-27: qty 1

## 2022-01-27 NOTE — ED Notes (Signed)
Chaz Mcglasson wife [phone] requesting an update on the patient

## 2022-01-27 NOTE — Evaluation (Signed)
Physical Therapy Evaluation Patient Details Name: John Ball MRN: [id number] DOB: 03/20/1941 Today's Date: 01/27/2022  History of Present Illness  Pt is a 81 y.o. male who presented 01/26/22 with SOB and chest pressure. Admitted with acute on chronic combined systolic and diastolic CHF and acute exacerbation of severe baseline COPD. PMH: combined systolic diastolic CHF, paroxysmal afib, ongoing tobacco abuse, COPD on chronic oxygen 5 L, CAD, PAD, hx of s/p CABG x4, carcinoid tumor of the lung, HTN, hx of leg DVT, OSA   Clinical Impression  Pt presents with condition above and deficits mentioned below, see PT Problem List. PTA, he was living with his wife in a 1-level house with a level entry. At baseline, pt was limited in walking in the house holding furniture and using his scooter outside due to poor endurance. Currently, pt appears to be close to his baseline, benefiting from a RW for improved stability and energy conservation with mobility. He displays deficits in strength, endurance, and balance, but this appears to be close to his baseline and his wife already encourages/instructs pt in mobility exercises daily, therefore pt's wife reporting no need for follow-up after d/c. Will continue to follow acutely to address his deficits and provide further education on energy conservation techniques.     Recommendations for follow up therapy are one component of a multi-disciplinary discharge planning process, led by the attending physician.  Recommendations may be updated based on patient status, additional functional criteria and insurance authorization.  Follow Up Recommendations No PT follow up (wife declining HHPT as she already manages his exercises daily)      Assistance Recommended at Discharge Intermittent Supervision/Assistance  Patient can return home with the following  A little help with bathing/dressing/bathroom;Assistance with cooking/housework;Direct supervision/assist for  medications management;Direct supervision/assist for financial management;Assist for transportation    Equipment Recommendations None recommended by PT  Recommendations for Other Services       Functional Status Assessment Patient has had a recent decline in their functional status and demonstrates the ability to make significant improvements in function in a reasonable and predictable amount of time.     Precautions / Restrictions Precautions Precautions: Fall;Other (comment) Precaution Comments: watch SpO2 (on 5L baseline) Restrictions Weight Bearing Restrictions: No      Mobility  Bed Mobility Overal bed mobility: Needs Assistance Bed Mobility: Supine to Sit, Sit to Supine     Supine to sit: Min guard, HOB elevated Sit to supine: Min guard, HOB elevated   General bed mobility comments: Min guard for safety with HOB elevated    Transfers Overall transfer level: Needs assistance Equipment used: Rolling walker (2 wheels) Transfers: Sit to/from Stand Sit to Stand: Min guard, +2 safety/equipment           General transfer comment: Min guard assist to power up to stand, no LOB    Ambulation/Gait Ambulation/Gait assistance: Min guard, +2 safety/equipment Gait Distance (Feet): 80 Feet Assistive device: Rolling walker (2 wheels) Gait Pattern/deviations: Step-through pattern, Decreased stride length Gait velocity: reduced Gait velocity interpretation: <1.31 ft/sec, indicative of household ambulator   General Gait Details: Pt with slow, but mostly steady gait with no LOB, min guard for safety  Stairs            Wheelchair Mobility    Modified Rankin (Stroke Patients Only)       Balance Overall balance assessment: Needs assistance Sitting-balance support: No upper extremity supported, Feet supported Sitting balance-Leahy Scale: Fair     Standing balance support: Bilateral  upper extremity supported, During functional activity, Reliant on assistive device  for balance Standing balance-Leahy Scale: Poor Standing balance comment: Reliant on RW                             Pertinent Vitals/Pain Pain Assessment Pain Assessment: Faces Faces Pain Scale: Hurts a little bit Pain Location: generalized Pain Descriptors / Indicators: Grimacing Pain Intervention(s): Limited activity within patient's tolerance, Monitored during session, Repositioned    Home Living Family/patient expects to be discharged to:: Private residence Living Arrangements: Spouse/significant other Available Help at Discharge: Family;Available 24 hours/day Type of Home: House Home Access: Level entry       Home Layout: One level Home Equipment: Engineer, maintenance (IT) (2 wheels);Rollator (4 wheels);Transport chair;Shower seat;Cane - single point;Adaptive equipment Additional Comments: wears 5L O2 at home, intermittently increases up to 8L as needed    Prior Function Prior Level of Function : Needs assist             Mobility Comments: limited walking in the house at baseline holding furniture, scooter outside. For the last month staying the recliner day and night and only walking the bathroom for BM ADLs Comments: Wife assists with ADLs, house keeping, meds, cooking, and finances     Hand Dominance        Extremity/Trunk Assessment   Upper Extremity Assessment Upper Extremity Assessment: Defer to OT evaluation    Lower Extremity Assessment Lower Extremity Assessment: Generalized weakness    Cervical / Trunk Assessment Cervical / Trunk Assessment: Kyphotic  Communication   Communication: HOH  Cognition Arousal/Alertness: Awake/alert Behavior During Therapy: WFL for tasks assessed/performed Overall Cognitive Status: Within Functional Limits for tasks assessed                                 General Comments: HOH impacting pt's ability to follow commands at times        General Comments General comments (skin  integrity, edema, etc.): unreliable pleth throughout session, on 5L O2 during session; educated pt and wife on energy conservation using a rollator at home and to reduce risk for falls; educated them on frequent short bouts of activity to improve endurance    Exercises     Assessment/Plan    PT Assessment Patient needs continued PT services  PT Problem List Decreased strength;Decreased activity tolerance;Decreased balance;Decreased mobility;Cardiopulmonary status limiting activity       PT Treatment Interventions DME instruction;Gait training;Functional mobility training;Therapeutic activities;Therapeutic exercise;Balance training;Neuromuscular re-education;Patient/family education    PT Goals (Current goals can be found in the Care Plan section)  Acute Rehab PT Goals Patient Stated Goal: to get better PT Goal Formulation: With patient/family Time For Goal Achievement: 02/10/22 Potential to Achieve Goals: Good    Frequency Min 3X/week     Co-evaluation PT/OT/SLP Co-Evaluation/Treatment: Yes Reason for Co-Treatment: Other (comment) (was unsure of pt's capability to participate) PT goals addressed during session: Mobility/safety with mobility;Balance;Proper use of DME         AM-PAC PT "6 Clicks" Mobility  Outcome Measure Help needed turning from your back to your side while in a flat bed without using bedrails?: A Little Help needed moving from lying on your back to sitting on the side of a flat bed without using bedrails?: A Little Help needed moving to and from a bed to a chair (including a wheelchair)?: A Little Help needed standing up  from a chair using your arms (e.g., wheelchair or bedside chair)?: A Little Help needed to walk in hospital room?: A Little Help needed climbing 3-5 steps with a railing? : A Lot 6 Click Score: 17    End of Session Equipment Utilized During Treatment: Gait belt;Oxygen Activity Tolerance: Patient tolerated treatment well Patient left: in  bed;with call bell/phone within reach;with family/visitor present Nurse Communication: Mobility status PT Visit Diagnosis: Unsteadiness on feet (R26.81);Other abnormalities of gait and mobility (R26.89);Muscle weakness (generalized) (M62.81)    Time: 1610-9604 PT Time Calculation (min) (ACUTE ONLY): 22 min   Charges:   PT Evaluation $PT Eval Moderate Complexity: 1 Mod          Moishe Spice, PT, DPT Acute Rehabilitation Services  Office: [phone]   Orvan Falconer 01/27/2022, 2:00 PM

## 2022-01-27 NOTE — Progress Notes (Signed)
Patient resting comfortably on [place] at this time, with no respiratory distress noted.  Bipap currently not indicated.  Will continue to monitor.

## 2022-01-27 NOTE — Plan of Care (Signed)
  Problem: Clinical Measurements: Goal: Respiratory complications will improve Outcome: Progressing Goal: Cardiovascular complication will be avoided Outcome: Progressing   Problem: Coping: Goal: Level of anxiety will decrease Outcome: Progressing   Problem: Elimination: Goal: Will not experience complications related to bowel motility Outcome: Progressing Goal: Will not experience complications related to urinary retention Outcome: Progressing

## 2022-01-27 NOTE — Evaluation (Signed)
Occupational Therapy Evaluation Patient Details Name: John Ball MRN: [id number] DOB: 05-Aug-1940 Today's Date: 01/27/2022   History of Present Illness Pt is a 81 y.o. male who presented 01/26/22 with SOB and chest pressure. Admitted with acute on chronic combined systolic and diastolic CHF and acute exacerbation of severe baseline COPD. PMH: combined systolic diastolic CHF, paroxysmal afib, ongoing tobacco abuse, COPD on chronic oxygen 5 L, CAD, PAD, hx of s/p CABG x4, carcinoid tumor of the lung, HTN, hx of leg DVT, OSA   Clinical Impression   Pt admitted for concerns listed above. PTA pt reported that he was fairly independent with aDL's and functional mobility, however he was limited to short distances due to quick fatigue and SoB. This session, pt presents most likely near baseline with mild increased weakness and increased need for assist with LB ADL's. He is requiring min guard for functional mobility and up to mod A for LB ADL's. Recommending HHOT, however wife may refuse, as she states that they can not do anything that she can't do for him at home. OT will follow acutely.      Recommendations for follow up therapy are one component of a multi-disciplinary discharge planning process, led by the attending physician.  Recommendations may be updated based on patient status, additional functional criteria and insurance authorization.   Follow Up Recommendations  Home health OT    Assistance Recommended at Discharge Intermittent Supervision/Assistance  Patient can return home with the following A little help with walking and/or transfers;A lot of help with bathing/dressing/bathroom;Assistance with cooking/housework;Direct supervision/assist for medications management;Direct supervision/assist for financial management;Assist for transportation;Help with stairs or ramp for entrance    Functional Status Assessment  Patient has had a recent decline in their functional status and demonstrates  the ability to make significant improvements in function in a reasonable and predictable amount of time.  Equipment Recommendations  BSC/3in1    Recommendations for Other Services       Precautions / Restrictions Precautions Precautions: Fall;Other (comment) Precaution Comments: watch SpO2 (on 5L baseline) Restrictions Weight Bearing Restrictions: No      Mobility Bed Mobility Overal bed mobility: Needs Assistance Bed Mobility: Supine to Sit, Sit to Supine     Supine to sit: Min guard, HOB elevated Sit to supine: Min guard, HOB elevated   General bed mobility comments: Min guard for safety with HOB elevated    Transfers Overall transfer level: Needs assistance Equipment used: Rolling walker (2 wheels) Transfers: Sit to/from Stand Sit to Stand: Min guard, +2 safety/equipment           General transfer comment: Min guard assist to power up to stand, no LOB      Balance Overall balance assessment: Needs assistance Sitting-balance support: No upper extremity supported, Feet supported Sitting balance-Leahy Scale: Fair     Standing balance support: Bilateral upper extremity supported, During functional activity, Reliant on assistive device for balance Standing balance-Leahy Scale: Poor Standing balance comment: Reliant on RW                           ADL either performed or assessed with clinical judgement   ADL Overall ADL's : Needs assistance/impaired Eating/Feeding: Set up;Sitting   Grooming: Set up;Sitting   Upper Body Bathing: Min guard;Sitting   Lower Body Bathing: Moderate assistance;Sitting/lateral leans;Sit to/from stand   Upper Body Dressing : Min guard;Sitting   Lower Body Dressing: Moderate assistance;Sitting/lateral leans;Sit to/from stand   Toilet Transfer: Min  guard;Ambulation   Toileting- Clothing Manipulation and Hygiene: Minimal assistance;Sitting/lateral lean;Sit to/from stand       Functional mobility during ADLs: Min  guard;Rolling walker (2 wheels) General ADL Comments: Pt requiring increased assist for ADL's due to weakness     Vision Baseline Vision/History: 1 Wears glasses Ability to See in Adequate Light: 0 Adequate Patient Visual Report: No change from baseline Vision Assessment?: No apparent visual deficits     Perception     Praxis      Pertinent Vitals/Pain Pain Assessment Pain Assessment: Faces Faces Pain Scale: Hurts a little bit Pain Location: generalized Pain Descriptors / Indicators: Grimacing Pain Intervention(s): Limited activity within patient's tolerance     Hand Dominance Right   Extremity/Trunk Assessment Upper Extremity Assessment Upper Extremity Assessment: Generalized weakness   Lower Extremity Assessment Lower Extremity Assessment: Generalized weakness   Cervical / Trunk Assessment Cervical / Trunk Assessment: Kyphotic   Communication Communication Communication: HOH   Cognition Arousal/Alertness: Awake/alert Behavior During Therapy: WFL for tasks assessed/performed Overall Cognitive Status: Within Functional Limits for tasks assessed                                 General Comments: HOH impacting pt's ability to follow commands at times     General Comments  VSS on 5L - pleth unreliable at times, typically maintained above 90%, no SoB noted. Educated pt and wife on energy conservation techniques for ADL's.    Exercises     Shoulder Instructions      Home Living Family/patient expects to be discharged to:: Private residence Living Arrangements: Spouse/significant other Available Help at Discharge: Family;Available 24 hours/day Type of Home: House Home Access: Level entry     Home Layout: One level     Bathroom Shower/Tub: Occupational psychologist: Handicapped height     Home Equipment: Engineer, maintenance (IT) (2 wheels);Rollator (4 wheels);Transport chair;Shower seat;Cane - single point;Adaptive  equipment Adaptive Equipment: Reacher;Long-handled sponge;Sock aid Additional Comments: wears 5L O2 at home, intermittently increases up to 8L as needed      Prior Functioning/Environment Prior Level of Function : Needs assist             Mobility Comments: limited walking in the house at baseline holding furniture, scooter outside. For the last month staying the recliner day and night and only walking the bathroom for BM ADLs Comments: Wife assists with ADLs, house keeping, meds, cooking, and finances        OT Problem List: Decreased strength;Decreased activity tolerance;Impaired balance (sitting and/or standing);Cardiopulmonary status limiting activity      OT Treatment/Interventions:      OT Goals(Current goals can be found in the care plan section) Acute Rehab OT Goals Patient Stated Goal: To go home OT Goal Formulation: With patient/family Time For Goal Achievement: 02/10/22 Potential to Achieve Goals: Good ADL Goals Pt Will Perform Grooming: with modified independence;standing Pt Will Perform Lower Body Bathing: with modified independence;sitting/lateral leans;sit to/from stand Pt Will Perform Lower Body Dressing: with modified independence;sitting/lateral leans;sit to/from stand Pt Will Transfer to Toilet: ambulating Pt Will Perform Toileting - Clothing Manipulation and hygiene: with modified independence;sitting/lateral leans;sit to/from stand Additional ADL Goal #1: Pt will recall 3 energy conservation techniques that he can do at home.  OT Frequency:      Co-evaluation PT/OT/SLP Co-Evaluation/Treatment: Yes Reason for Co-Treatment: Other (comment) (Unsure of pt's ability to participate) PT goals addressed during session: Mobility/safety  with mobility;Balance;Proper use of DME OT goals addressed during session: ADL's and self-care      AM-PAC OT "6 Clicks" Daily Activity     Outcome Measure Help from another person eating meals?: A Little Help from another  person taking care of personal grooming?: A Little Help from another person toileting, which includes using toliet, bedpan, or urinal?: A Lot Help from another person bathing (including washing, rinsing, drying)?: A Lot Help from another person to put on and taking off regular upper body clothing?: A Little Help from another person to put on and taking off regular lower body clothing?: A Lot 6 Click Score: 15   End of Session Equipment Utilized During Treatment: Gait belt;Rolling walker (2 wheels) Nurse Communication: Mobility status  Activity Tolerance: Patient tolerated treatment well Patient left: in bed;with call bell/phone within reach;with family/visitor present  OT Visit Diagnosis: Unsteadiness on feet (R26.81);Other abnormalities of gait and mobility (R26.89);Muscle weakness (generalized) (M62.81)                Time: 1504-1364 OT Time Calculation (min): 22 min Charges:  OT General Charges $OT Visit: 1 Visit OT Evaluation $OT Eval Moderate Complexity: 1 Mod  Broderick Fonseca H., OTR/L Acute Rehabilitation  Joanne Brander Elane Yolanda Bonine 01/27/2022, 4:10 PM

## 2022-01-27 NOTE — Consult Note (Addendum)
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 right hemithorax with eventration of the right hemidiaphragm and small right pleural effusion (partially loculated), chronic. Left lower lobe scarring. No frank interstitial edema. No pleural effusion or pneumothorax. Cardiomegaly.  Postsurgical changes related to prior CABG. Median sternotomy. IMPRESSION: Postsurgical changes in the right hemithorax with small right pleural effusion (partially loculated), chronic. Electronically Signed   By: Julian Hy M.D.   On: 01/26/2022 21:17     Assessment and Plan:   1. Acute on chronic respiratory failure - suspect  multifactorial  2. Acute exacerbation of severe baseline COPD - pulm management per IM  3. Acute on chronic HFrEF - very complex clinical case with progressive failure to thrive over the last several months - patient previously has said he is ready to die and hospice was considered; wife felt their involvement was disruptive to their home regimen and felt capable to helping to manage symptoms at home - suspect they would benefit from speaking with palliative care again here at the hospital, wife contemplating - not on BB due to prior concerns for low output, hypotension per notes - seems to be clinically improving with IV Lasix - needs daily weights, I/O's - resume low dose spironolactone and low dose losartan - hold off Jardiance as wife was not sure if he was taking,  follow Cr/BP with above meds - historically would benefit from close attention to medication bottles in follow-up as there seems to be some disconnect between what's in the chart and what they report he's taking  4. CAD s/p remote CABG, last cath 09/2021 for medical therapy, mildly elevated troponin - troponin elevation is c/w prior - reports generalized chest pressure without pain, may be exacerbated by pulmonary status - continue medical therapy - not on ASA given concomitant Eliquis, not on BB due to prior concern for low output, intolerant of statins/Zetia  5. CKD stage 3a - Cr appears stable, baseline appears 1.2-1.4 - avoid ibuprofen  6. Paroxysmal atrial fibrillation, maintaining NSR - continue apixaban - needs weight this admission to ensure dosing criteria OK, also continue to follow Cr carefully - no longer on amiodarone, digoxin as above - keep off for now  7. H/o NSCLC with known enlarging pulmonary nodule of uncertain significance - followed by pulm  8. Mild-moderate MR by echocardiogram  - follow clinically  Risk Assessment/Risk Scores:      New York Heart Association (NYHA) Functional Class NYHA Class  IV  CHA2DS2-VASc Score = 5   This indicates a 7.2% annual risk of stroke. The patient's score is based upon: CHF History: 1 HTN History: 1 Diabetes History: 0 Stroke History: 0 Vascular Disease History: 1 Age Score: 2 Gender Score: 0       For questions or updates, please contact [facility] Please consult [email or web address] for contact info under    Signed, Charlie Pitter, PA-C  01/27/2022 10:34 AM   Attending Note:   The patient was seen and examined.  Agree with assessment and plan as noted above.  Changes made to the above note as needed.  Patient seen and independently examined with Melina Copa, PA .   We discussed all aspects of the encounter. I agree with the assessment and plan as stated above.    Acute on chronic respiratory failure .  Multifactorial .  Has severe , end stage COPD ,  lung cancer,  chronic combined CHF.   Will try IV lasix although he does not appear particularly volume overloaded  I have discussed with his wife I have told her that we cannot correct all that is going on with him and that a hospice approach   I told her that we can relieve his suffering , even if we cannot cure him of his illness.     I have spent a total of 40 minutes with patient reviewing hospital  notes , telemetry, EKGs, labs and examining patient as well as establishing an assessment and plan that was discussed with the patient.  > 50% of time was spent in direct patient care.  [facility] will sign off.   Medication Recommendations:   per medical team  Other recommendations (labs, testing, etc):   Follow up as an outpatient:  with primary care.    Thayer Headings, Brooke Bonito., MD, [facility] 01/27/2022, 11:13 AM [address],  [place] Pager [phone]

## 2022-01-27 NOTE — Progress Notes (Signed)
PROGRESS NOTE    John Ball  POE:[id number] DOB: 1941-01-04 DOA: 01/26/2022 PCP: Ronnell Freshwater, NP   Brief Narrative: John Ball is a 81 y.o. male with a history of heart failure, paroxysmal atrial fibrillation, tobacco abuse, COPD, chronic respiratory failure, CAD, PAD, lung cancer. Patient presented secondary to chest pain and shortness of breath, found to have acute on chronic respiratory failure and evidence of acute heart failure. Patient required BiPAP on admission and was started on IV diuresis. Cardiology consulted and have recommended hospice referral.   Assessment and Plan:  Acute on chronic combined systolic and diastolic heart failure Most recent LVEF of 30-35% with grade 2 diastolic dysfunction from 01/18/22.  Lasix diuresis initiated. Cardiology consulted on admission and have recommended hospice/end of life care; cardiology signed off.   Chronic obstructive pulmonary disease No wheezing but severe disease on chronic oxygen. Likely contributing to respiratory failure. -Continue Dulera, Incruse Ellipta, albuterol  Primary hypertension Patient is on losartan which is held secondary to AKI.  AKI Baseline creatinine of about 1.1. Creatinine of 1.42 on admission. Trended down with Lasix.  OSA BiPAP PRN  Acute on chronic respiratory failure with hypoxia Multifactorial in setting of heart failure and COPD.   Malignant neoplasm of bronchus and lung Patient with a history of non-small cell lung cancer s/p right lower lobectomy per pulmonology notes. Patient continues to smoke.  Tobacco abuse Cessation discussed. Per H&P, patient is interested in quitting. Nicotine patch order.  Bipolar 1 disorder -Continue Klonopin   Pleural effusion Chronic. Stable.  DVT prophylaxis: Eliquis Code Status:   Code Status: Full Code Family Communication: None at bedside Disposition Plan: Discharge home pending transition to oral Lasix and PT/OT  recommendations   Consultants:  Cardiology Palliative care medicine  Procedures:  BiPAP  Antimicrobials: None    Subjective: Patient reports breathing is improved. Patient reports that he is chronically on 6 L/min of oxygen, although documentation states 4 L/min.  Objective: BP 101/63   Pulse 84   Temp 98.1 F (36.7 C)   Resp 16   SpO2 100%   Examination:  General exam: Appears calm and comfortable Respiratory system: Transmitted upper airway sounds bilaterally. Respiratory effort normal. Cardiovascular system: S1 & S2 heard, RRR. No murmurs, rubs, gallops or clicks. Gastrointestinal system: Abdomen is nondistended, soft and nontender. Normal bowel sounds heard. Central nervous system: Alert and oriented. Significant hearing loss Musculoskeletal: No edema. No calf tenderness Skin: No cyanosis. No rashes Psychiatry: Judgement and insight appear normal. Mood & affect appropriate.    Data Reviewed: I have personally reviewed following labs and imaging studies  CBC Lab Results  Component Value Date   WBC 5.9 01/27/2022   RBC 3.29 (L) 01/27/2022   HGB 11.9 (L) 01/27/2022   HCT 37.1 (L) 01/27/2022   MCV 112.8 (H) 01/27/2022   MCH 36.2 (H) 01/27/2022   PLT 179 01/27/2022   MCHC 32.1 01/27/2022   RDW 12.7 01/27/2022   LYMPHSABS 0.8 01/17/2022   MONOABS 0.4 01/17/2022   EOSABS 0.1 01/17/2022   BASOSABS 0.0 31/54/0086     Last metabolic panel Lab Results  Component Value Date   NA 138 01/27/2022   K 4.1 01/27/2022   CL 98 01/27/2022   CO2 31 01/27/2022   BUN 31 (H) 01/27/2022   CREATININE 1.25 (H) 01/27/2022   GLUCOSE 139 (H) 01/27/2022   GFRNONAA 59 (L) 01/27/2022   GFRAA 92 03/24/2019   CALCIUM 9.5 01/27/2022   PHOS 3.9 01/27/2022  PROT 6.7 01/27/2022   ALBUMIN 3.9 01/27/2022   LABGLOB 2.1 08/20/2021   AGRATIO 2.1 08/20/2021   BILITOT 0.5 01/27/2022   ALKPHOS 56 01/27/2022   AST 24 01/27/2022   ALT 22 01/27/2022   ANIONGAP 9 01/27/2022     GFR: Estimated Creatinine Clearance: 44.2 mL/min (A) (by C-G formula based on SCr of 1.25 mg/dL (H)).  Recent Results (from the past 240 hour(s))  SARS Coronavirus 2 by RT PCR (hospital order, performed in [facility] hospital lab) *cepheid single result test* Anterior Nasal Swab     Status: None   Collection Time: 01/17/22  5:48 PM   Specimen: Anterior Nasal Swab  Result Value Ref Range Status   SARS Coronavirus 2 by RT PCR NEGATIVE NEGATIVE Final    Comment: (NOTE) SARS-CoV-2 target nucleic acids are NOT DETECTED.  The SARS-CoV-2 RNA is generally detectable in upper and lower respiratory specimens during the acute phase of infection. The lowest concentration of SARS-CoV-2 viral copies this assay can detect is 250 copies / mL. A negative result does not preclude SARS-CoV-2 infection and should not be used as the sole basis for treatment or other patient management decisions.  A negative result may occur with improper specimen collection / handling, submission of specimen other than nasopharyngeal swab, presence of viral mutation(s) within the areas targeted by this assay, and inadequate number of viral copies (<250 copies / mL). A negative result must be combined with clinical observations, patient history, and epidemiological information.  Fact Sheet for Patients:   https://www.patel.info/  Fact Sheet for Healthcare Providers: https://hall.com/  This test is not yet approved or  cleared by the [place] FDA and has been authorized for detection and/or diagnosis of SARS-CoV-2 by FDA under an Emergency Use Authorization (EUA).  This EUA will remain in effect (meaning this test can be used) for the duration of the COVID-19 declaration under Section 564(b)(1) of the Act, 21 U.S.C. section 360bbb-3(b)(1), unless the authorization is terminated or revoked sooner.  Performed at [facility], [place] [address], [place],  [place] [address]   SARS Coronavirus 2 by RT PCR (hospital order, performed in [facility] hospital lab) *cepheid single result test* Anterior Nasal Swab     Status: None   Collection Time: 01/26/22 10:28 PM   Specimen: Anterior Nasal Swab  Result Value Ref Range Status   SARS Coronavirus 2 by RT PCR NEGATIVE NEGATIVE Final    Comment: (NOTE) SARS-CoV-2 target nucleic acids are NOT DETECTED.  The SARS-CoV-2 RNA is generally detectable in upper and lower respiratory specimens during the acute phase of infection. The lowest concentration of SARS-CoV-2 viral copies this assay can detect is 250 copies / mL. A negative result does not preclude SARS-CoV-2 infection and should not be used as the sole basis for treatment or other patient management decisions.  A negative result may occur with improper specimen collection / handling, submission of specimen other than nasopharyngeal swab, presence of viral mutation(s) within the areas targeted by this assay, and inadequate number of viral copies (<250 copies / mL). A negative result must be combined with clinical observations, patient history, and epidemiological information.  Fact Sheet for Patients:   https://www.patel.info/  Fact Sheet for Healthcare Providers: https://hall.com/  This test is not yet approved or  cleared by the [place] FDA and has been authorized for detection and/or diagnosis of SARS-CoV-2 by FDA under an Emergency Use Authorization (EUA).  This EUA will remain in effect (meaning this test can  be used) for the duration of the COVID-19 declaration under Section 564(b)(1) of the Act, 21 U.S.C. section 360bbb-3(b)(1), unless the authorization is terminated or revoked sooner.  Performed at [facility], [place] [address], [place], [place] [address]   Respiratory (~20 pathogens) panel by PCR     Status: None   Collection Time: 01/26/22 10:28 PM   Specimen: Nasopharyngeal Swab;  Respiratory  Result Value Ref Range Status   Adenovirus NOT DETECTED NOT DETECTED Final   Coronavirus 229E NOT DETECTED NOT DETECTED Final    Comment: (NOTE) The Coronavirus on the Respiratory Panel, DOES NOT test for the novel  Coronavirus (2019 nCoV)    Coronavirus HKU1 NOT DETECTED NOT DETECTED Final   Coronavirus NL63 NOT DETECTED NOT DETECTED Final   Coronavirus OC43 NOT DETECTED NOT DETECTED Final   Metapneumovirus NOT DETECTED NOT DETECTED Final   Rhinovirus / Enterovirus NOT DETECTED NOT DETECTED Final   Influenza A NOT DETECTED NOT DETECTED Final   Influenza B NOT DETECTED NOT DETECTED Final   Parainfluenza Virus 1 NOT DETECTED NOT DETECTED Final   Parainfluenza Virus 2 NOT DETECTED NOT DETECTED Final   Parainfluenza Virus 3 NOT DETECTED NOT DETECTED Final   Parainfluenza Virus 4 NOT DETECTED NOT DETECTED Final   Respiratory Syncytial Virus NOT DETECTED NOT DETECTED Final   Bordetella pertussis NOT DETECTED NOT DETECTED Final   Bordetella Parapertussis NOT DETECTED NOT DETECTED Final   Chlamydophila pneumoniae NOT DETECTED NOT DETECTED Final   Mycoplasma pneumoniae NOT DETECTED NOT DETECTED Final    Comment: Performed at [facility], [place], [place], [place] [address]      Radiology Studies: DG Chest Port 1 View  Result Date: 01/26/2022 CLINICAL DATA:  Chest pain, shortness of breath EXAM: PORTABLE CHEST 1 VIEW COMPARISON:  01/17/2022 FINDINGS: Postsurgical changes in the right hemithorax with eventration of the right hemidiaphragm and small right pleural effusion (partially loculated), chronic. Left lower lobe scarring. No frank interstitial edema. No pleural effusion or pneumothorax. Cardiomegaly.  Postsurgical changes related to prior CABG. Median sternotomy. IMPRESSION: Postsurgical changes in the right hemithorax with small right pleural effusion (partially loculated), chronic. Electronically Signed   By: Julian Hy M.D.   On: 01/26/2022 21:17       LOS: 1 day    Cordelia Poche, MD [facility] 01/27/2022, 8:27 AM   If 7PM-7AM, please contact night-coverage [email or web address]

## 2022-01-27 NOTE — ED Notes (Signed)
This RN heard alarm going home, RN went into room and O2 was off. Pt sats were 70% on a good wave pleth. RN put pt back on O2 and pts sats are at 93% Primary Rn notified.

## 2022-01-27 NOTE — Progress Notes (Signed)
     Referral received for [name] :goals of care discussion. Chart reviewed and updates received from RN in ED and on 2C. Patient assessed and is unable to engage appropriately in discussions. Lethargic and quickly falls back asleep. Attempted to contact patient's wife Wells Guiles. She stated she was parking and on her way to the bedside. Unfortunately, we have just missed each other several times during multiple attempts to visit in the ED and after transfer to [facility].   Called Wells Guiles again but was unable to reach. Voicemail left with contact information given.   PMT will re-attempt to contact family at a later time/date. Detailed note and recommendations to follow once GOC has been completed.   Thank you for your referral and allowing PMT to assist in Mr. VICTOR LANGENBACH care.   Dorthy Cooler, [facility] Palliative Medicine Team  Team Phone # [phone]   NO CHARGE

## 2022-01-27 NOTE — Progress Notes (Signed)
Pt resting comfortably on [place] at this time, no respiratory distress noted. BiPAP held for now. Will continue to monitor.

## 2022-01-28 ENCOUNTER — Other Ambulatory Visit (HOSPITAL_COMMUNITY): Payer: Self-pay

## 2022-01-28 ENCOUNTER — Telehealth: Payer: Self-pay | Admitting: Acute Care

## 2022-01-28 DIAGNOSIS — Z7189 Other specified counseling: Secondary | ICD-10-CM

## 2022-01-28 DIAGNOSIS — E43 Unspecified severe protein-calorie malnutrition: Secondary | ICD-10-CM | POA: Insufficient documentation

## 2022-01-28 DIAGNOSIS — I5043 Acute on chronic combined systolic (congestive) and diastolic (congestive) heart failure: Secondary | ICD-10-CM | POA: Diagnosis not present

## 2022-01-28 LAB — CBC
HCT: 31.8 % — ABNORMAL LOW (ref 39.0–52.0)
Hemoglobin: 10.2 g/dL — ABNORMAL LOW (ref 13.0–17.0)
MCH: 35.4 pg — ABNORMAL HIGH (ref 26.0–34.0)
MCHC: 32.1 g/dL (ref 30.0–36.0)
MCV: 110.4 fL — ABNORMAL HIGH (ref 80.0–100.0)
Platelets: 144 10*3/uL — ABNORMAL LOW (ref 150–400)
RBC: 2.88 MIL/uL — ABNORMAL LOW (ref 4.22–5.81)
RDW: 12.6 % (ref 11.5–15.5)
WBC: 4.9 10*3/uL (ref 4.0–10.5)
nRBC: 0 % (ref 0.0–0.2)

## 2022-01-28 LAB — BASIC METABOLIC PANEL
Anion gap: 8 (ref 5–15)
BUN: 27 mg/dL — ABNORMAL HIGH (ref 8–23)
CO2: 34 mmol/L — ABNORMAL HIGH (ref 22–32)
Calcium: 9.2 mg/dL (ref 8.9–10.3)
Chloride: 99 mmol/L (ref 98–111)
Creatinine, Ser: 0.94 mg/dL (ref 0.61–1.24)
GFR, Estimated: >60 mL/min (ref >60)
Glucose, Bld: 92 mg/dL (ref 70–99)
Potassium: 3.9 mmol/L (ref 3.5–5.1)
Sodium: 141 mmol/L (ref 135–145)

## 2022-01-28 MED ORDER — LORAZEPAM 2 MG/ML PO CONC: 1.0000 mg | ORAL | Status: DC | PRN

## 2022-01-28 MED ORDER — GLYCOPYRROLATE 1 MG PO TABS: 1.0000 mg | ORAL_TABLET | ORAL | Status: DC | PRN

## 2022-01-28 MED ORDER — ENSURE ENLIVE PO LIQD
237.0000 mL | Freq: Three times a day (TID) | ORAL | Status: DC
Start: 2022-01-28 — End: 2022-01-28

## 2022-01-28 MED ORDER — HYDROMORPHONE HCL 1 MG/ML IJ SOLN: 0.5000 mg | INTRAMUSCULAR | Status: DC | PRN

## 2022-01-28 MED ORDER — BIOTENE DRY MOUTH MT LIQD: 15.0000 mL | OROMUCOSAL | Status: DC | PRN

## 2022-01-28 MED ORDER — GLYCOPYRROLATE 0.2 MG/ML IJ SOLN: 0.2000 mg | INTRAMUSCULAR | Status: DC | PRN

## 2022-01-28 MED ORDER — MORPHINE SULFATE (CONCENTRATE) 20 MG/ML PO SOLN
5.0000 mg | ORAL | 0 refills | Status: AC | PRN
  Filled 2022-01-28: qty 30, 10d supply, fill #0

## 2022-01-28 MED ORDER — LORAZEPAM 2 MG/ML IJ SOLN: 1.0000 mg | INTRAMUSCULAR | Status: DC | PRN

## 2022-01-28 MED ORDER — ADULT MULTIVITAMIN W/MINERALS CH: 1.0000 | ORAL_TABLET | Freq: Every day | ORAL | Status: DC

## 2022-01-28 MED ORDER — POLYVINYL ALCOHOL 1.4 % OP SOLN: 1.0000 [drp] | Freq: Four times a day (QID) | OPHTHALMIC | Status: DC | PRN

## 2022-01-28 MED ORDER — LORAZEPAM 1 MG PO TABS: 1.0000 mg | ORAL_TABLET | ORAL | Status: DC | PRN

## 2022-01-28 NOTE — Telephone Encounter (Signed)
Both CT and follow up appt have already been cancelled. Will keep him on my list to check back in a month.

## 2022-01-28 NOTE — Consult Note (Signed)
Consultation Note Date: 01/28/2022   Patient Name: John Ball  DOB: [birth year]  MRN: [id number]  Age / Sex: 81 y.o., male  PCP: Ronnell Freshwater, NP Referring Physician: Mariel Aloe, MD  Reason for Consultation: Establishing goals of care  HPI/Patient Profile: 81 y.o. male  with past medical history of  combined systolic diastolic CHF, paroxysmal atrial fibrillation, ongoing tobacco abuse, COPD on chronic oxygen 4 L, CAD, PAD status post bypass, carcinoid tumor of the lung admitted on 01/26/2022 with shortness of breath and chest pressure.    Patient was admitted for acute CHF exacerbation and acute on chronic respiratory failure with hypoxia. This is patient's 3rd admission in the past 6 months. PMT has been consulted to assist with goals of care conversation.  Clinical Assessment and Goals of Care:  I have reviewed medical records including EPIC notes, labs and imaging, assessed the patient and then met at the bedside with patient's wife [name] to discuss diagnosis prognosis, GOC, EOL wishes, disposition and options.  I introduced Palliative Medicine as specialized medical care for people living with serious illness. It focuses on providing relief from the symptoms and stress of a serious illness. The goal is to improve quality of life for both the patient and the family.  We discussed a brief life review of the patient and then focused on their current illness.  The natural disease trajectory and expectations at EOL were discussed.  I attempted to elicit values and goals of care important to the patient.    Medical History Review and Understanding:  Reviewed patient's acute and chronic comorbidities including CHF, COPD, ongoing tobacco use, malignancy of the lung, overall failure to thrive.  His wife understands the severity of his illness and the limited treatment options available to him.  Social  History: Patient lives at home with his wife who he has been married to for 17 years.  He has a daughter and a son, 3 grandchildren, and is a [place].  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  He likes to eat and fish.  Patient enjoys going out to breakfast daily and watching TV.  Palliative Symptoms: Pain, dyspnea, anxiety  Code Status: Concepts specific to code status, artifical feeding and hydration, and rehospitalization were considered and discussed. Recommended consideration of DNR status, understanding evidenced-based poor outcomes in similar hospitalized patients, as the cause of the arrest is likely associated with chronic/terminal disease rather than a reversible acute cardio-pulmonary event.   Discussion: Patient is more alert and able to participate intermittently in the conversation today.  His wife tells me he has been stating on multiple occasions that he is ready to die and does not want to live this way anymore.  We reviewed patient's ongoing suffering despite ongoing treatment of all of his chronic comorbidities.  His wife explains that she has had home health support with her mother in the past and did not find this very helpful.  This is why she initially declined hospice to come into the home.  We reviewed the available resources from hospice at home, including expert symptom management as the natural disease process continues.  She is concerned about whether insurance will cover this and I reassured her, explaining the eligibility and certification process.  I explained that life-prolonging medications would no longer be provided as the focus would be entirely on comfort.  She is agreeable and eager to take him home soon as possible.  She does want hospice RN to admit him so that she does not bring him right back to the hospital if his symptoms flareup again.  I explore patient's thoughts and feelings and he is agreeable,  although slightly irritated with the conversation and "arguing over him."    The difference between aggressive medical intervention and comfort care was considered in light of the patient's goals of care.  Patient's wife is agreeable to transition to comfort care while hospice is being arranged at home.  Discussed the importance of continued conversation with family and the medical providers regarding overall plan of care and treatment options, ensuring decisions are within the context of the patient's values and GOCs.   Questions and concerns were addressed.  Hard Choices booklet left for review. The family was encouraged to call with questions or concerns.  PMT will continue to support holistically.   SUMMARY OF RECOMMENDATIONS   -CODE STATUS changed to DNR -Transition to comfort care today to ensure relief of suffering while arranging for hospice at home Dilaudid PRN for pain/air hunger/comfort Robinul PRN for excessive secretions Ativan PRN for agitation/anxiety Zofran PRN for nausea Liquifilm tears PRN for dry eyes Haldol PRN for agitation/anxiety May have comfort feeding Comfort cart for family Unrestricted visitations in the setting of EOL (per policy) Oxygen PRN 2L or less for comfort. No escalation.   -Discussed with Dr. Alvester Chou, [facility] care hospital liaison -Psychosocial emotional support provided -PMT will continue to follow and support  Prognosis:  < 6 months  Discharge Planning: Home with Hospice      Primary Diagnoses: Present on Admission:  Acute on chronic combined systolic and diastolic CHF (congestive heart failure) (HCC)  Chronic obstructive pulmonary disease ([place])  Essential hypertension  Atrial fibrillation, chronic (HCC)  Hyperlipidemia with target LDL less than 70  OSA (obstructive sleep apnea)  Acute on chronic respiratory failure with hypoxia (HCC)  Malignant neoplasm of bronchus and lung (HCC)  Tobacco abuse  Bipolar I disorder ([place])  Pleural  effusion   Physical Exam Vitals and nursing note reviewed.  Constitutional:      General: He is not in acute distress.    Appearance: He is ill-appearing.     Interventions: Nasal cannula in place.  Cardiovascular:     Rate and Rhythm: Normal rate.  Pulmonary:     Effort: Pulmonary effort is normal. No respiratory distress.  Skin:    General: Skin is warm and dry.  Neurological:     Mental Status: He is alert. Mental status is at baseline.  Psychiatric:        Behavior: Behavior is cooperative.     Vital Signs: BP 120/68   Pulse 87   Temp 97.8 F (36.6 C) (Oral)   Resp (!) 22   Wt 67.3 kg   SpO2 91%   BMI 20.12 kg/m  Pain Scale: 0-10   Pain Score: Asleep   SpO2: SpO2: 91 % O2 Device:SpO2: 91 % O2 Flow Rate: .O2 Flow Rate (L/min): 6 L/min   Palliative Assessment/Data:     MDM: High    Joanthony Hamza Johnnette Litter, PA-C  Palliative Medicine Team Team phone # [phone]  Thank you for allowing the Palliative Medicine Team to assist in the care of this patient. Please utilize secure chat with additional questions, if there is no response within 30 minutes please call the above phone number.  Palliative Medicine Team providers are available by phone from 7am to 7pm daily and can be reached through the team  cell phone.  Should this patient require assistance outside of these hours, please call the patient's attending physician.

## 2022-01-28 NOTE — Evaluation (Signed)
Clinical/Bedside Swallow Evaluation Patient Details  Name: John Ball MRN: [id number] Date of Birth: 11/13/1940  Today's Date: 01/28/2022 Time: SLP Start Time (ACUTE ONLY): 29 SLP Stop Time (ACUTE ONLY): 1120 SLP Time Calculation (min) (ACUTE ONLY): 10 min  Past Medical History:  Past Medical History:  Diagnosis Date   Anxiety    Arthritis    CAD in native artery    Chronic kidney disease, stage 3a ([place])    Chronic respiratory failure (HCC)    Complication of anesthesia    hx prolonged post op oxygen dependent/  hx hallucinations for 2 day post op lobectomy 2012   COPD, severe ([place])    Depression    Essential hypertension    Hallucinations    Hearing loss of both ears    does wear his hearing aids   HFrEF (heart failure with reduced ejection fraction) ([place])    History of arterial bypass of lower extremity    RIGHT FEM-POP   History of DVT of lower extremity    History of panic attacks    Hyperlipidemia with target LDL less than 70    Left main coronary artery disease 2004   a) Severe LM CAD 2004 -->s/p  cabg x4 (LIMA-LAD, free RIMA-RI, SVG-OM 2, SVG-RPDA); b) 06/2014: Abnormal Myoview --> c)cardiac Cath Jan 2016: pLM 70%, pLAD 40-50%- patent LIMA-LAD.  RI -competitive flow noted from RIMA-RI graft, circumflex, normal caliber with moderate OM1.  OM 2 occluded.  SVG-OM 2 patent. RCA CTO after large RV M, faint R-R and LAD septal-PDA collaterals =--> new   Mitral regurgitation    Neuropathy, peripheral    No natural teeth    does not wear his dentures   Nocturia    OSA (obstructive sleep apnea)    uses O2  via [place]  --  intolerate to CPAP   PAF (paroxysmal atrial fibrillation) ([place])    Peripheral arterial occlusive disease (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--- a) 04/2011: R L Lobectomy & med node dissection ([id number]) - [facility] [place], [place]) w/o post-op Rx; b) CT-A Chest Jan 2013: R pl effusion, R hilar LAN (3.3 cm x 2.8 cm & 2.2 cm x 1.3 cm); c) 2/'13 PET-CT Chest: Bilat Hilar LAN, no distant Mets  d) CT chest 9/'14: Stable shotty hilar nodes bilaterally w/ no pathologic sized LAD or suspicious Pulm nodule; ;;  (oncologist at   Wears glasses    Past Surgical History:  Past Surgical History:  Procedure Laterality Date   ABDOMINAL AORTOGRAM N/A 11/12/2017   Procedure: ABDOMINAL AORTOGRAM;  Surgeon: Donzetta Matters,  Georgia Dom, MD;  Location: [facility];  Service: Cardiovascular;  Laterality: N/A;   ABDOMINAL AORTOGRAM W/LOWER EXTREMITY Left 02/10/2018   Procedure: ABDOMINAL AORTOGRAM W/LOWER EXTREMITY;  Surgeon: Serafina Mitchell, MD;  Location: [facility];  Service: Cardiovascular;  Laterality: Left;   APPLICATION OF WOUND VAC Left 12/03/2017   Procedure: EXCHANGE OF WOUND VAC LEFT GROIN,;  Surgeon: Rosetta Posner, MD;  Location: [facility];  Service: Vascular;  Laterality: Left;   CARDIAC CATHETERIZATION  07/13/2002   Ischemia RCA region on Cardiolite// 60-70% ostial left main, 50% midCx, 60-70% mid RI,  70% mRCA, 90% JeNu and crux 75% RCA ,  95% PLA//  severe LM and 3Vessel CAD, perserved LV, ef 60%   COLONOSCOPY  2008     WNL   CORONARY ARTERY BYPASS GRAFT  07/27/2002   LIMA-LAD, freeRIMA-RI,SVG-OM2, SVG-rPDA; Dr. Roxan Hockey   CYSTOSCOPY W/ URETERAL STENT PLACEMENT N/A 02/21/2015   Procedure: BILATERAL RETROGRADE PYELOGRAM;  Surgeon: Festus Aloe, MD;  [facility];  Service: Urology;  Laterality: N/A;   ENDARTERECTOMY FEMORAL Left 11/17/2017   Procedure: LEFT Femoral Endarterectomy with Patch Angioplasty;  Surgeon: Rosetta Posner, MD;  Location: [facility];  Service: Vascular;  Laterality: Left;   EYE SURGERY Left 05/2012   FEMORAL ARTERY - POPLITEAL ARTERY BYPASS GRAFT Right 10-24-2008   dr early   w/ GoreTex graft   FEMORAL-POPLITEAL BYPASS GRAFT Left 02/11/2018   Procedure: BYPASS GRAFT LOWER EXTREMITY;  Surgeon: Rosetta Posner, MD;  Location: [facility];  Service: Vascular;  Laterality: Left;   FEMORAL-TIBIAL BYPASS GRAFT Right 01/10/2014   Procedure: Right Femoral to Posterior Tibial Bypass Graft using Left Leg Vein, Thrombectomy Right Common Femoral and Profunda of Leg . ;  Surgeon: Rosetta Posner, MD;  Location: [facility];  Service: Vascular;  Laterality: Right;   HYDROCELE EXCISION Left    LEFT ANKLE SURGERY  1990's   LEFT HEART CATHETERIZATION WITH CORONARY/GRAFT ANGIOGRAM N/A 07/12/2014   Procedure: LEFT HEART CATHETERIZATION WITH Beatrix Fetters;  Surgeon: Troy Sine, MD;  Location: [facility];  Service: Cardiovascular;  Laterality: N/A;  pLM 70%, pLAD 40-50%- patent LIMA-LAD.  RI -competitive flow noted from fRIMA-RI graft, circumflex, normal caliber with moderate OM1.  OM 2 occluded.  SVG-OM 2 patent. RCA CTO after large RVM, faint R-R and LAD septal-PDA collateral   LOWER EXTREMITY ANGIOGRAPHY Bilateral 11/12/2017   Procedure: LOWER EXTREMITY ANGIOGRAPHY - Left Lower;  Surgeon: Waynetta Sandy, MD;  Location: [facility];  Service: Cardiovascular;  Laterality: Bilateral;   NM MYOVIEW LTD  11/'15; 3/'19, 5/'20   a) Mod sized, mod intensity/partially  reversible inferior defect c/w  prior infarct mild/mod peri-infarct ischemia.  Inferoseptal HK.  INTERMEDIATE RISK. --> Cath showed CTO of Native RCA & SVG-rPDA.; b) EF ~42%. Inferior-Inferoseptal defect - likely old scar - INTERMEDIATE RISK b/c low EF (Echo EF 50-55%) - med Rx; c) [place] -by report - Non-ischemic.   PERIPHERAL VASCULAR INTERVENTION Left 02/10/2018   Procedure: PERIPHERAL VASCULAR INTERVENTION;  Surgeon: Serafina Mitchell, MD;  Location: [facility];  Service: Cardiovascular;  Laterality: Left;  left common iliac stent, attempted left SFA stent   RIGHT ABOVE KNEE POPLITEAL GRAFT TO BELOW KNEE POPLITEAL ARTERY BYPASS WITH REVERSE SAPHENOUS VEIN  03/02/2010   RIGHT/LEFT HEART CATH AND CORONARY/GRAFT ANGIOGRAPHY N/A 09/12/2021   Procedure: RIGHT/LEFT HEART CATH AND CORONARY/GRAFT ANGIOGRAPHY;  Surgeon: Larey Dresser, MD;  Location: [facility];  Service: Cardiovascular;  Laterality: N/A;   THROMBECTOMY OF RIGHT FEMORAL TO ABOVE KNEE POPLITEAL GORETEX GRAFT ANGIOPLASTY OF GORETEX AND SAPHENOUS VEIN JUNCTION AND ABOVE KNEE POPLITEAL ARTERY   10-06-2010   DR EARLY   TONSILLECTOMY     TRANSTHORACIC ECHOCARDIOGRAM  09/2017   EF 50-55%.  More accurate - Mild inferolateral HK (c/w known RCA occlusion).  GR 1 DD.  Mild LA dilation mildly increased PA pressures with mild RV/RA dilation.   TRANSURETHRAL RESECTION OF BLADDER TUMOR N/A 02/21/2015   Procedure: TRANSURETHRAL RESECTION OF BLADDER TUMOR (TURBT);  Surgeon: Festus Aloe, MD;  [facility];  Service: Urology;  Laterality: N/A;   VIDEO ASSISTED THORACOSCOPY (VATS)/WEDGE RESECTION Right 05-01-2011    [place]   LOWER LOBECTOMY W/  NODE DISSECTION   WOUND EXPLORATION Left 11/28/2017   Procedure: WOUND EXPLORATION GROIN, WASHOUT AND WOUND VAC APPLICATION;  Surgeon: Conrad Hickory, MD;  Location: [facility];  Service: Vascular;  Laterality: Left;   HPI:  Pt is a 81 y.o. male who presented 01/26/22  with SOB and chest pressure. Admitted with acute on chronic combined systolic and diastolic CHF and acute exacerbation of severe baseline COPD. PMH: combined systolic diastolic CHF, paroxysmal afib, ongoing tobacco abuse, COPD on chronic oxygen 5 L, CAD, PAD, hx of s/p CABG x4, carcinoid tumor of the lung, HTN, hx of leg DVT, OSA    Assessment / Plan / Recommendation  Clinical Impression  Pt participated in a clinical swallow evaluation. He presented with normal swallow with thorough mastication despite absence of teeth, the appearance of a brisk swallow response, and no s/s of aspiration when taxed with sequential liquid boluses or mixed solid/liquid consistencies.  Recommend continuing current diet. No dysphagia identified. SLP will sign off. D/W RN. SLP Visit Diagnosis: Dysphagia, unspecified (R13.10)    Aspiration Risk  No limitations    Diet Recommendation   Regular consistency diet, thin liquids  Medication Administration: Whole meds with liquid    Other  Recommendations Oral Care Recommendations: Oral care BID    Recommendations for follow up therapy are one component of a multi-disciplinary discharge planning process, led by the attending physician.  Recommendations may be updated based on patient status, additional functional criteria and insurance authorization.  Follow up Recommendations No SLP follow up      Assistance Recommended at Discharge    Functional Status Assessment    Frequency and Duration            Prognosis        Swallow Study   General HPI: Pt is a 81 y.o. male who presented 01/26/22 with SOB and chest pressure. Admitted with acute on chronic combined systolic and diastolic CHF and acute exacerbation of severe baseline COPD. PMH: combined systolic diastolic CHF, paroxysmal afib, ongoing tobacco abuse, COPD on chronic oxygen 5 L, CAD, PAD, hx of s/p CABG x4, carcinoid tumor of the lung, HTN, hx of leg DVT, OSA Type of Study: Bedside Swallow  Evaluation Previous Swallow Assessment: no Diet Prior to this Study: Regular;Thin liquids Temperature Spikes Noted: No Respiratory Status: Nasal cannula History of Recent Intubation: No Behavior/Cognition: Alert;Cooperative Oral Cavity Assessment: Within Functional Limits Oral Care Completed by SLP: No Oral Cavity - Dentition: Edentulous Vision: Functional for self-feeding Self-Feeding Abilities: Able to feed self Patient Positioning: Upright in bed Baseline Vocal Quality: Normal Volitional Cough: Strong Volitional Swallow: Able to elicit    Oral/Motor/Sensory Function Overall Oral Motor/Sensory Function: Within functional limits   Ice Chips Ice chips:  Within functional limits   Thin Liquid Thin Liquid: Within functional limits    Nectar Thick Nectar Thick Liquid: Not tested   Honey Thick Honey Thick Liquid: Not tested   Puree Puree: Within functional limits   Solid     Solid: Within functional limits      Juan Quam Laurice 01/28/2022,11:25 AM   Estill Bamberg L. Tivis Ringer, MA CCC/SLP Clinical Specialist - [place] Office number [phone]

## 2022-01-28 NOTE — Progress Notes (Signed)
Heart Failure Navigator Progress Note  Assessed for Heart & Vascular TOC clinic readiness.  Patient does not meet criteria due to now a hospice patient.Earnestine Leys, BSN, RN Heart Failure Transport planner Only

## 2022-01-28 NOTE — Consult Note (Addendum)
   [name] Inpatient Consult   01/28/2022  John Ball [birth year] [id number]    [facility] Organization [ACO] Patient: Medicare ACO REACH  Primary Care Provider:  Ronnell Freshwater, NP, [facility]  Patient was recently active with [facility]  Reviewed for post hospital follow up needs and patient now for comfort measure.  Plan: Patient will be managed with hospice post hospital plans.   Of note, [facility] Care Management services does not replace or interfere with any services that are needed or arranged by inpatient [facility] care management team.  For additional questions or referrals please contact:  Natividad Brood, RN BSN [facility] Liaison  [phone] business mobile phone Toll free office [phone]  Fax number: [phone] [email or web address]

## 2022-01-28 NOTE — Discharge Summary (Addendum)
Physician Discharge Summary   Patient: John Ball MRN: [id number] DOB: 09/21/1940  Admit date:     01/26/2022  Discharge date: 01/28/22  Discharge Physician: Cordelia Poche, MD   PCP: Ronnell Freshwater, NP   Recommendations at discharge:  Home hospice  Discharge Diagnoses: Principal Problem:   Acute on chronic combined systolic and diastolic CHF (congestive heart failure) (John Ball) Active Problems:   Chronic obstructive pulmonary disease ([place])   Essential hypertension   Hyperlipidemia with target LDL less than 70   Atrial fibrillation, chronic (HCC)   OSA (obstructive sleep apnea)   Bipolar I disorder (HCC)   Tobacco abuse   Malignant neoplasm of bronchus and lung (HCC)   Acute on chronic respiratory failure with hypoxia (HCC)   Pleural effusion   Protein-calorie malnutrition, severe  Resolved Problems:   * No resolved hospital problems. *  Hospital Course: John Ball is a 81 y.o. male with a history of heart failure, paroxysmal atrial fibrillation, tobacco abuse, COPD, chronic respiratory failure, CAD, PAD, lung cancer. Patient presented secondary to chest pain and shortness of breath, found to have acute on chronic respiratory failure and evidence of acute heart failure. Patient required BiPAP on admission and was started on IV diuresis. Cardiology consulted and have recommended hospice referral. Palliative care consulted and set up patient for home hospice. Patient is now DNR and focus on comfort care.  Assessment and Plan:  Acute on chronic combined systolic and diastolic heart failure Most recent LVEF of 30-35% with grade 2 diastolic dysfunction from 01/18/22.  Lasix diuresis initiated. Cardiology consulted on admission and have recommended hospice/end of life care; cardiology signed off.    Chronic obstructive pulmonary disease No wheezing but severe disease on chronic oxygen. Likely contributing to respiratory failure. -Continue Dulera, Incruse Ellipta, albuterol    Primary hypertension Patient is on losartan which is held secondary to AKI.   AKI Baseline creatinine of about 1.1. Creatinine of 1.42 on admission. Trended down with Lasix.   OSA BiPAP PRN   Acute on chronic respiratory failure with hypoxia Multifactorial in setting of heart failure and COPD.    Malignant neoplasm of bronchus and lung Patient with a history of non-small cell lung cancer s/p right lower lobectomy per pulmonology notes. Patient continues to smoke.   Tobacco abuse Cessation discussed. Per H&P, patient is interested in quitting. Nicotine patch order.   Bipolar 1 disorder -Continue Klonopin    Pleural effusion Chronic. Stable.   Consultants: Cardiology, Palliative care medicine Procedures performed: BiPAP  Disposition:  Home with hospice care Diet recommendation: As tolerated  DISCHARGE MEDICATION: Allergies as of 01/28/2022       Reactions   Aspirin Other (See Comments)   Avoids due to severe bruises   Crestor [rosuvastatin Calcium] Other (See Comments)   myalgia   Lipitor [atorvastatin Calcium] Other (See Comments)   myalgia   Zetia [ezetimibe] Other (See Comments)   myalgia   Zocor [simvastatin - High Dose] Other (See Comments)   myalgia   Atorvastatin Itching   Other reaction(s): Joint swelling Other reaction(s): Joint swelling   Atorvastatin Calcium Itching, Swelling   Joint swelling   Aripiprazole Other (See Comments)   Altered mental status   Effexor [venlafaxine Hydrochloride] Itching   Morphine And Related Itching        Medication List     STOP taking these medications    digoxin 0.125 MG tablet Commonly known as: LANOXIN   empagliflozin 10 MG Tabs tablet Commonly known as: JARDIANCE  ibuprofen 200 MG tablet Commonly known as: ADVIL   torsemide 20 MG tablet Commonly known as: DEMADEX       TAKE these medications    acetaminophen 500 MG tablet Commonly known as: TYLENOL Take 1,000 mg by mouth every 6 (six) hours  as needed for moderate pain or headache.   albuterol 108 (90 Base) MCG/ACT inhaler Commonly known as: VENTOLIN HFA Inhale 1 puff into the lungs every 6 (six) hours as needed for wheezing or shortness of breath.   albuterol 1.25 MG/3ML nebulizer solution Commonly known as: ACCUNEB Take 1 ampule by nebulization every 6 (six) hours as needed for wheezing. 2.5 mg/3 ml   apixaban 5 MG Tabs tablet Commonly known as: ELIQUIS Take 1 tablet (5 mg total) by mouth 2 (two) times daily.   cholecalciferol 25 MCG (1000 UNIT) tablet Commonly known as: VITAMIN D Take 1,000 Units by mouth in the morning.   clonazePAM 0.25 MG disintegrating tablet Commonly known as: KLONOPIN Take 1 tablet (0.25 mg total) by mouth at bedtime. What changed: how much to take   Cymbalta 60 MG capsule Generic drug: DULoxetine Take 60 mg by mouth in the morning.   furosemide 20 MG tablet Commonly known as: LASIX Take 1 tablet (20 mg total) by mouth daily. Please increase to 40 mg daily in case of weight increase 2 to 3 lbs in 24 hrs or 5 lbs in 7 days.   losartan 25 MG tablet Commonly known as: COZAAR Take 0.5 tablets (12.5 mg total) by mouth at bedtime.   morphine 20 MG/ML concentrated solution Commonly known as: ROXANOL Take 0.25-0.5 mLs (5-10 mg total) by mouth every 4 (four) hours as needed for severe pain, shortness of breath or moderate pain. May give sublingually if needed.   nitroGLYCERIN 0.4 MG SL tablet Commonly known as: NITROSTAT Place 1 tablet (0.4 mg total) under the tongue every 5 (five) minutes as needed for chest pain.   OXYGEN Inhale 5 L into the lungs continuous.   intolerant to CPAP   potassium chloride 10 MEQ tablet Commonly known as: KLOR-CON M Take 1 tablet (10 mEq total) by mouth daily.   prazosin 1 MG capsule Commonly known as: MINIPRESS Take 1 mg by mouth at bedtime.   QUEtiapine Fumarate 150 MG 24 hr tablet Commonly known as: SEROQUEL XR Take 150 mg by mouth at bedtime.    spironolactone 25 MG tablet Commonly known as: ALDACTONE Take 0.5 tablets (12.5 mg total) by mouth daily.   Tiotropium Bromide Monohydrate 2.5 MCG/ACT Aers Inhale 2 puffs into the lungs in the morning.   traZODone 100 MG tablet Commonly known as: DESYREL Take 100 mg by mouth at bedtime.   Wixela Inhub 250-50 MCG/ACT Aepb Generic drug: fluticasone-salmeterol Inhale 1 puff into the lungs 2 (two) times daily.        Discharge Exam: BP 110/77   Pulse 91   Temp 97.8 F (36.6 C) (Oral)   Resp 20   Wt 67.3 kg   SpO2 (!) 86%   BMI 20.12 kg/m   General exam: Appears calm Respiratory system: Diminished. Some increased work of breathing after walking from the commode. Cardiovascular system: S1 & S2 heard, RRR. No murmurs, rubs, gallops or clicks. Gastrointestinal system: Abdomen is nondistended, soft and nontender. No organomegaly or masses felt. Normal bowel sounds heard. Central nervous system: Alert and oriented.    Condition at discharge: poor  The results of significant diagnostics from this hospitalization (including imaging, microbiology, ancillary and laboratory) are  listed below for reference.   Imaging Studies: DG Chest Port 1 View  Result Date: 01/26/2022 CLINICAL DATA:  Chest pain, shortness of breath EXAM: PORTABLE CHEST 1 VIEW COMPARISON:  01/17/2022 FINDINGS: Postsurgical changes in the right hemithorax with eventration of the right hemidiaphragm and small right pleural effusion (partially loculated), chronic. Left lower lobe scarring. No frank interstitial edema. No pleural effusion or pneumothorax. Cardiomegaly.  Postsurgical changes related to prior CABG. Median sternotomy. IMPRESSION: Postsurgical changes in the right hemithorax with small right pleural effusion (partially loculated), chronic. Electronically Signed   By: Julian Hy M.D.   On: 01/26/2022 21:17   ECHOCARDIOGRAM COMPLETE  Result Date: 01/18/2022    ECHOCARDIOGRAM REPORT   Patient Name:    BRICE KOSSMAN Date of Exam: 01/18/2022 Medical Rec #:  [id number]        Height:       72.0 in Accession #:    [id number]       Weight:       149.5 lb Date of Birth:  July 13, 1940         BSA:          1.883 m Patient Age:    89 years         BP:           112/80 mmHg Patient Gender: M                HR:           79 bpm. Exam Location:  Inpatient Procedure: 2D Echo, Cardiac Doppler and Color Doppler Indications:    Dyspnea  History:        Patient has prior history of Echocardiogram examinations, most                 recent 08/09/2021. CHF, CAD, Prior CABG, COPD and PAD; Risk                 Factors:Current Smoker, Sleep Apnea, Dyslipidemia and                 Hypertension. [place].  Sonographer:    [name] Referring Phys: [id number] Candace Gallus MELVIN  Sonographer Comments: Image acquisition challenging due to uncooperative patient. IMPRESSIONS  1. Left ventricular ejection fraction, by estimation, is 30 to 35%. The left ventricle has moderately decreased function. The left ventricle demonstrates global hypokinesis. Left ventricular diastolic parameters are consistent with Grade II diastolic dysfunction (pseudonormalization).  2. Right ventricular systolic function is mildly reduced. The right ventricular size is normal.  3. The mitral valve is normal in structure. Mild to moderate mitral valve regurgitation. No evidence of mitral stenosis.  4. The aortic valve is tricuspid. There is mild calcification of the aortic valve. Aortic valve regurgitation is not visualized. Aortic valve sclerosis is present, with no evidence of aortic valve stenosis.  5. The inferior vena cava is dilated in size with <50% respiratory variability, suggesting right atrial pressure of 15 mmHg. Comparison(s): Prior images reviewed side by side. Prior EF 25%. FINDINGS  Left Ventricle: Left ventricular ejection fraction, by estimation, is 30 to 35%. The left ventricle has moderately decreased function. The left ventricle demonstrates global  hypokinesis. The left ventricular internal cavity size was normal in size. There is no left ventricular hypertrophy. Left ventricular diastolic parameters are consistent with Grade II diastolic dysfunction (pseudonormalization). Right Ventricle: The right ventricular size is normal. No increase in right ventricular wall thickness. Right ventricular systolic function is mildly reduced. Left Atrium: Left  atrial size was normal in size. Right Atrium: Right atrial size was normal in size. Pericardium: There is no evidence of pericardial effusion. Mitral Valve: The mitral valve is normal in structure. Mild to moderate mitral valve regurgitation. No evidence of mitral valve stenosis. Tricuspid Valve: The tricuspid valve is normal in structure. Tricuspid valve regurgitation is not demonstrated. No evidence of tricuspid stenosis. Aortic Valve: The aortic valve is tricuspid. There is mild calcification of the aortic valve. Aortic valve regurgitation is not visualized. Aortic valve sclerosis is present, with no evidence of aortic valve stenosis. Pulmonic Valve: The pulmonic valve was normal in structure. Pulmonic valve regurgitation is not visualized. No evidence of pulmonic stenosis. Aorta: The aortic root is normal in size and structure. Venous: The inferior vena cava is dilated in size with less than 50% respiratory variability, suggesting right atrial pressure of 15 mmHg. IAS/Shunts: No atrial level shunt detected by color flow Doppler.  LEFT VENTRICLE PLAX 2D LVIDd:         6.45 cm      Diastology LVIDs:         6.10 cm      LV e' medial:    5.52 cm/s LV PW:         0.70 cm      LV E/e' medial:  20.8 LV IVS:        0.85 cm      LV e' lateral:   4.38 cm/s LVOT diam:     2.40 cm      LV E/e' lateral: 26.3 LV SV:         70 LV SV Index:   37 LVOT Area:     4.52 cm  LV Volumes (MOD) LV vol d, MOD A2C: 200.0 ml LV vol d, MOD A4C: 184.0 ml LV vol s, MOD A2C: 169.0 ml LV vol s, MOD A4C: 127.0 ml LV SV MOD A2C:     31.0 ml LV  SV MOD A4C:     184.0 ml LV SV MOD BP:      44.1 ml RIGHT VENTRICLE            IVC RV S prime:     8.59 cm/s  IVC diam: 2.70 cm TAPSE (M-mode): 1.4 cm LEFT ATRIUM             Index LA diam:        3.90 cm 2.07 cm/m LA Vol (A2C):   71.3 ml 37.87 ml/m LA Vol (A4C):   80.8 ml 42.92 ml/m LA Biplane Vol: 76.8 ml 40.80 ml/m  AORTIC VALVE LVOT Vmax:   104.00 cm/s LVOT Vmean:  63.300 cm/s LVOT VTI:    0.155 m  AORTA Ao Root diam: 3.80 cm Ao Asc diam:  3.90 cm MITRAL VALVE MV Area (PHT): 4.65 cm       SHUNTS MV Decel Time: 163 msec       Systemic VTI:  0.16 m MR Peak grad:    71.8 mmHg    Systemic Diam: 2.40 cm MR Mean grad:    55.0 mmHg MR Vmax:         423.67 cm/s MR Vmean:        352.0 cm/s MR PISA:         0.25 cm MR PISA Eff ROA: 2 mm MR PISA Radius:  0.20 cm MV E velocity: 115.00 cm/s MV A velocity: 39.70 cm/s MV E/A ratio:  2.90 Candee Furbish MD Electronically signed by Candee Furbish MD Signature  Date/Time: 01/18/2022/11:59:52 AM    Final    DG Chest Port 1 View  Result Date: 01/17/2022 CLINICAL DATA:  Shortness of breath. EXAM: PORTABLE CHEST 1 VIEW COMPARISON:  Chest x-ray 09/14/2021 FINDINGS: Patient is status post cardiac surgery. The cardiac silhouette is mildly enlarged, unchanged. PICC line has been removed. There is central pulmonary vascular congestion. There is some patchy airspace opacities in both lung bases. Again seen is elevation of the right hemidiaphragm. Small right pleural effusion is likely present. Small left pleural effusion is present. There is no evidence for pneumothorax or acute fracture. IMPRESSION: 1. Cardiomegaly with central pulmonary vascular congestion and small pleural effusions. 2. Bilateral airspace opacities may represent infection or edema. Follow-up chest x-ray recommended in 4-6 weeks to confirm resolution. Electronically Signed   By: Ronney Asters M.D.   On: 01/17/2022 17:30    Microbiology: Results for orders placed or performed during the hospital encounter of  01/26/22  SARS Coronavirus 2 by RT PCR (hospital order, performed in [facility] hospital lab) *cepheid single result test* Anterior Nasal Swab     Status: None   Collection Time: 01/26/22 10:28 PM   Specimen: Anterior Nasal Swab  Result Value Ref Range Status   SARS Coronavirus 2 by RT PCR NEGATIVE NEGATIVE Final    Comment: (NOTE) SARS-CoV-2 target nucleic acids are NOT DETECTED.  The SARS-CoV-2 RNA is generally detectable in upper and lower respiratory specimens during the acute phase of infection. The lowest concentration of SARS-CoV-2 viral copies this assay can detect is 250 copies / mL. A negative result does not preclude SARS-CoV-2 infection and should not be used as the sole basis for treatment or other patient management decisions.  A negative result may occur with improper specimen collection / handling, submission of specimen other than nasopharyngeal swab, presence of viral mutation(s) within the areas targeted by this assay, and inadequate number of viral copies (<250 copies / mL). A negative result must be combined with clinical observations, patient history, and epidemiological information.  Fact Sheet for Patients:   https://www.patel.info/  Fact Sheet for Healthcare Providers: https://hall.com/  This test is not yet approved or  cleared by the [place] FDA and has been authorized for detection and/or diagnosis of SARS-CoV-2 by FDA under an Emergency Use Authorization (EUA).  This EUA will remain in effect (meaning this test can be used) for the duration of the COVID-19 declaration under Section 564(b)(1) of the Act, 21 U.S.C. section 360bbb-3(b)(1), unless the authorization is terminated or revoked sooner.  Performed at [facility], [place] [address] [place],  [address]   Respiratory (~20 pathogens) panel by PCR     Status: None   Collection Time: 01/26/22 10:28 PM   Specimen: Nasopharyngeal Swab;  Respiratory  Result Value Ref Range Status   Adenovirus NOT DETECTED NOT DETECTED Final   Coronavirus 229E NOT DETECTED NOT DETECTED Final    Comment: (NOTE) The Coronavirus on the Respiratory Panel, DOES NOT test for the novel  Coronavirus (2019 nCoV)    Coronavirus HKU1 NOT DETECTED NOT DETECTED Final   Coronavirus NL63 NOT DETECTED NOT DETECTED Final   Coronavirus OC43 NOT DETECTED NOT DETECTED Final   Metapneumovirus NOT DETECTED NOT DETECTED Final   Rhinovirus / Enterovirus NOT DETECTED NOT DETECTED Final   Influenza A NOT DETECTED NOT DETECTED Final   Influenza B NOT DETECTED NOT DETECTED Final   Parainfluenza Virus 1 NOT DETECTED NOT DETECTED Final   Parainfluenza Virus 2 NOT DETECTED NOT DETECTED  Final   Parainfluenza Virus 3 NOT DETECTED NOT DETECTED Final   Parainfluenza Virus 4 NOT DETECTED NOT DETECTED Final   Respiratory Syncytial Virus NOT DETECTED NOT DETECTED Final   Bordetella pertussis NOT DETECTED NOT DETECTED Final   Bordetella Parapertussis NOT DETECTED NOT DETECTED Final   Chlamydophila pneumoniae NOT DETECTED NOT DETECTED Final   Mycoplasma pneumoniae NOT DETECTED NOT DETECTED Final    Comment: Performed at [facility], [place] [address] [place], [place] [address]    Labs: CBC: Recent Labs  Lab 01/26/22 2032 01/26/22 2128 01/27/22 0354 01/28/22 0032  WBC 6.5  --  5.9 4.9  HGB 12.3* 13.6 11.9* 10.2*  HCT 39.2 40.0 37.1* 31.8*  MCV 115.0*  --  112.8* 110.4*  PLT 198  --  179 004*   Basic Metabolic Panel: Recent Labs  Lab 01/26/22 2032 01/26/22 2128 01/26/22 2323 01/27/22 0354 01/28/22 0032  NA 138 138  --  138 141  K 4.6 4.9  --  4.1 3.9  CL 99  --   --  98 99  CO2 29  --   --  31 34*  GLUCOSE 125*  --   --  139* 92  BUN 33*  --   --  31* 27*  CREATININE 1.42*  --   --  1.25* 0.94  CALCIUM 9.5  --   --  9.5 9.2  MG  --   --  2.2 2.1  --   PHOS  --   --  4.0 3.9  --    Liver Function Tests: Recent Labs  Lab 01/26/22 2032  01/27/22 0354  AST 25 24  ALT 26 22  ALKPHOS 59 56  BILITOT 0.3 0.5  PROT 6.9 6.7  ALBUMIN 4.0 3.9   Discharge time spent: 35 minutes.  Signed: Cordelia Poche, MD [facility] 01/28/2022

## 2022-01-28 NOTE — Telephone Encounter (Signed)
John Ball, I just passed Mr Manuele wife in the hall. She said he has been placed on hospice due to his heart failure. She has asked that we cancel his 8/10 CT Chest and also cancel his appointment 8/18 as follow up. Can we check on him in a month and make sure they are still good with Hospice and do not want further CT's? Thanks

## 2022-01-28 NOTE — Progress Notes (Signed)
Occupational Therapy Treatment Patient Details Name: John Ball MRN: [id number] DOB: 02/01/1941 Today's Date: 01/28/2022   History of present illness Pt is a 81 y.o. male who presented 01/26/22 with SOB and chest pressure. Admitted with acute on chronic combined systolic and diastolic CHF and acute exacerbation of severe baseline COPD. PMH: combined systolic diastolic CHF, paroxysmal afib, ongoing tobacco abuse, COPD on chronic oxygen 5 L, CAD, PAD, hx of s/p CABG x4, carcinoid tumor of the lung, HTN, hx of leg DVT, OSA   OT comments  Pt supine in bed and agreeable to OT session.  Pt disoriented to time (month, day of week) and place (reports [place]) today, demonstrates decreased recall, awareness and problem solving.  Asking for a cigarette multiple times during session (RN did apply new nicotine patch), but educated on safety. Pt on 5L during session with SPO2 desaturating into low 80s during mobility, poor waveform but appears to decrease with increased RR; at rest maintained >90%.  Pt completing bed mobility with min guard, transfers using RW with min guard and grooming with setup assist seated. Cueing for PLB techniques throughout session and requires increased time.  Will follow acutely, continue to recommend [place] services.    Recommendations for follow up therapy are one component of a multi-disciplinary discharge planning process, led by the attending physician.  Recommendations may be updated based on patient status, additional functional criteria and insurance authorization.    Follow Up Recommendations  Home health OT    Assistance Recommended at Discharge Frequent or constant Supervision/Assistance  Patient can return home with the following  A little help with walking and/or transfers;A lot of help with bathing/dressing/bathroom;Assistance with cooking/housework;Direct supervision/assist for medications management;Direct supervision/assist for financial management;Assist for  transportation;Help with stairs or ramp for entrance   Equipment Recommendations  BSC/3in1    Recommendations for Other Services      Precautions / Restrictions Precautions Precautions: Fall;Other (comment) Precaution Comments: watch SpO2 (on 5L baseline) Restrictions Weight Bearing Restrictions: No       Mobility Bed Mobility Overal bed mobility: Needs Assistance Bed Mobility: Supine to Sit     Supine to sit: Min guard, HOB elevated     General bed mobility comments: safety    Transfers Overall transfer level: Needs assistance Equipment used: Rolling walker (2 wheels) Transfers: Sit to/from Stand Sit to Stand: Min guard           General transfer comment: min guard using RW     Balance Overall balance assessment: Needs assistance Sitting-balance support: No upper extremity supported, Feet supported Sitting balance-Leahy Scale: Fair     Standing balance support: Bilateral upper extremity supported, During functional activity Standing balance-Leahy Scale: Poor Standing balance comment: Reliant on RW                           ADL either performed or assessed with clinical judgement   ADL Overall ADL's : Needs assistance/impaired     Grooming: Set up;Sitting               Lower Body Dressing: Moderate assistance;Sitting/lateral leans;Sit to/from stand   Toilet Transfer: Min guard;Ambulation;Rolling walker (2 wheels) Toilet Transfer Details (indicate cue type and reason): simulated to recliner         Functional mobility during ADLs: Min guard;Rolling walker (2 wheels) General ADL Comments: pt limited by decreased activity tolerance and endurance, generalized weakness.  Pt on 5L [employer] during session with notable increased RR  but poor waveform with Spo2 desaturationing to low 80s (unclear on accuracy), briefly bumped to 6L but able to maintain saturation on 5L at rest.    Extremity/Trunk Assessment              Vision        Perception     Praxis      Cognition Arousal/Alertness: Awake/alert Behavior During Therapy: WFL for tasks assessed/performed Overall Cognitive Status: Impaired/Different from baseline Area of Impairment: Orientation, Awareness, Problem solving, Memory, Safety/judgement                 Orientation Level: Disoriented to, Time, Place   Memory: Decreased short-term memory   Safety/Judgement: Decreased awareness of safety Awareness: Emergent Problem Solving: Slow processing, Difficulty sequencing, Requires verbal cues General Comments: pt reports at [place], with cueing able to correct to [place]; reports correct year but unable to state month and voices thursday.  Reoriented during session.  Pt able to follow simple commands with increased time as HOH, but demonstrates decreased awareness to safety, deficits.  Asking for cigarette mulitple times during session.        Exercises      Shoulder Instructions       General Comments provided handout ofr energy conservation in room, did not review with pt.  Montiored SpO2 throughout session    Pertinent Vitals/ Pain       Pain Assessment Pain Assessment: Faces Faces Pain Scale: No hurt Pain Intervention(s): Monitored during session  Home Living                                          Prior Functioning/Environment              Frequency  Min 2X/week        Progress Toward Goals  OT Goals(current goals can now be found in the care plan section)  Progress towards OT goals: Progressing toward goals  Acute Rehab OT Goals Patient Stated Goal: to get better OT Goal Formulation: With patient Time For Goal Achievement: 02/10/22 Potential to Achieve Goals: Good  Plan Discharge plan remains appropriate;Frequency remains appropriate    Co-evaluation                 AM-PAC OT "6 Clicks" Daily Activity     Outcome Measure   Help from another person eating meals?: A  Little Help from another person taking care of personal grooming?: A Little Help from another person toileting, which includes using toliet, bedpan, or urinal?: A Lot Help from another person bathing (including washing, rinsing, drying)?: A Lot Help from another person to put on and taking off regular upper body clothing?: A Little Help from another person to put on and taking off regular lower body clothing?: A Lot 6 Click Score: 15    End of Session Equipment Utilized During Treatment: Gait belt;Rolling walker (2 wheels)  OT Visit Diagnosis: Unsteadiness on feet (R26.81);Other abnormalities of gait and mobility (R26.89);Muscle weakness (generalized) (M62.81)   Activity Tolerance Patient tolerated treatment well   Patient Left with call bell/phone within reach;in chair;with chair alarm set;with nursing/sitter in room   Nurse Communication Mobility status        Time: 2025-4270 OT Time Calculation (min): 27 min  Charges: OT General Charges $OT Visit: 1 Visit OT Treatments $Self Care/Home Management : 23-37 mins  Adonis Brook B, OT Acute  Rehabilitation Services Office [phone]   Delight Stare 01/28/2022, 10:30 AM

## 2022-01-28 NOTE — Progress Notes (Addendum)
Initial Nutrition Assessment  DOCUMENTATION CODES:   Severe malnutrition in context of chronic illness  INTERVENTION:   -Ensure Enlive po TID, each supplement provides 350 kcal and 20 grams of protein -Liberalize diet to regular -MVI with minerals daily  NUTRITION DIAGNOSIS:   Increased nutrient needs related to chronic illness (CHF) as evidenced by estimated needs.  GOAL:   Patient will meet greater than or equal to 90% of their needs  MONITOR:   PO intake, Supplement acceptance  REASON FOR ASSESSMENT:   Consult Assessment of nutrition requirement/status  ASSESSMENT:   Pt with medical history significant of combined systolic diastolic CHF, paroxysmal atrial fibrillation, ongoing tobacco abuse, COPD on chronic oxygen 4 L, CAD, PAD status post bypass, carcinoid tumor of the lung  Pt admitted with CHF.   Reviewed I/O's: -475 ml x 24 hours and -1.7 L x 24 hours   Spoke with pt wife at bedside. Pt was somnolent at time of visit, but arose briefly during interview and smiled at this RD. Per wife, pt with fair appetite. He ate most of his breakfast this morning (noted meal completions 100%) as well as a box of cookies from [employer].  PTA, pt wife shares that appetite has been improving. Pt usually consumes 2-3 meals per day (Breakfast: eggs, bacon, toast, tomatoes, beans, and oatmeal; Lunch: snack (bag of chips); Dinner: Secretary/administrator from [employer]). Pt also drinks 2-3 Ensure supplements daily.   Pt weighs daily. His UBW is around 145#. Reviewed wt hx; pt has experienced a 1% wt loss over the past 3 months, which is not significant for time frame. Pt wife endorses wt loss this admission secondary to diuresis.   Discussed importance of good meal and supplement intake to promote healing. Pt wife amenable to ensure and requests diet liberalization.   Per MD notes, plan to discharge home with hospice services today.   Medications reviewed and include lasix and aldactone.   Labs  reviewed: CBGS: 154.   NUTRITION - FOCUSED PHYSICAL EXAM:  Flowsheet Row Most Recent Value  Orbital Region Moderate depletion  Upper Arm Region Severe depletion  Thoracic and Lumbar Region Severe depletion  Buccal Region Moderate depletion  Temple Region Severe depletion  Clavicle Bone Region Severe depletion  Clavicle and Acromion Bone Region Severe depletion  Scapular Bone Region Severe depletion  Dorsal Hand Severe depletion  Patellar Region Severe depletion  Anterior Thigh Region Severe depletion  Posterior Calf Region Severe depletion  Edema (RD Assessment) None  Hair Reviewed  Eyes Reviewed  Mouth Reviewed  Skin Reviewed  Nails Reviewed       Diet Order:   Diet Order             Diet regular Room service appropriate? Yes; Fluid consistency: Thin  Diet effective now                   EDUCATION NEEDS:   Education needs have been addressed  Skin:  Skin Assessment: Reviewed RN Assessment  Last BM:  01/28/22 (type 1)  Height:   Ht Readings from Last 1 Encounters:  01/17/22 6' (1.829 m)    Weight:   Wt Readings from Last 1 Encounters:  01/28/22 67.3 kg    Ideal Body Weight:  80.9 kg  BMI:  Body mass index is 20.12 kg/m.  Estimated Nutritional Needs:   Kcal:  2150-2350  Protein:  120-135 grams  Fluid:  > 2 L    Loistine Chance, RD, LDN, [place] Registered Dietitian II Certified  Diabetes Care and Education Specialist Please refer to [facility] for RD and/or RD on-call/weekend/after hours pager

## 2022-01-28 NOTE — Progress Notes (Signed)
Manufacturing engineer [facility]) Hospital Liaison Note  Referral received for patient/family interest in hospice at home. [place] liaison spoke with patient's wife John Ball to confirm interest. Interest confirmed.   Hospice eligibility pending.   Plan is to discharge home today or tomorrow via private vehicle.   DME in the home: oxygen through [employer]  DME needs: none  Please send patient home with comfort medications/prescriptions at discharge.   Please call with any questions or concerns. Thank you  Roselee Nova, [facility] Liaison [phone]

## 2022-01-29 DIAGNOSIS — G4733 Obstructive sleep apnea (adult) (pediatric): Secondary | ICD-10-CM | POA: Diagnosis not present

## 2022-01-29 DIAGNOSIS — J449 Chronic obstructive pulmonary disease, unspecified: Secondary | ICD-10-CM | POA: Diagnosis not present

## 2022-01-29 DIAGNOSIS — I4891 Unspecified atrial fibrillation: Secondary | ICD-10-CM | POA: Diagnosis not present

## 2022-01-29 DIAGNOSIS — I25119 Atherosclerotic heart disease of native coronary artery with unspecified angina pectoris: Secondary | ICD-10-CM | POA: Diagnosis not present

## 2022-01-29 DIAGNOSIS — I70209 Unspecified atherosclerosis of native arteries of extremities, unspecified extremity: Secondary | ICD-10-CM | POA: Diagnosis not present

## 2022-01-29 DIAGNOSIS — E785 Hyperlipidemia, unspecified: Secondary | ICD-10-CM | POA: Diagnosis not present

## 2022-01-29 DIAGNOSIS — I110 Hypertensive heart disease with heart failure: Secondary | ICD-10-CM | POA: Diagnosis not present

## 2022-01-29 DIAGNOSIS — Z9981 Dependence on supplemental oxygen: Secondary | ICD-10-CM | POA: Diagnosis not present

## 2022-01-29 DIAGNOSIS — I509 Heart failure, unspecified: Secondary | ICD-10-CM | POA: Diagnosis not present

## 2022-01-29 DIAGNOSIS — F4310 Post-traumatic stress disorder, unspecified: Secondary | ICD-10-CM | POA: Diagnosis not present

## 2022-01-29 DIAGNOSIS — F319 Bipolar disorder, unspecified: Secondary | ICD-10-CM | POA: Diagnosis not present

## 2022-02-01 DIAGNOSIS — I4891 Unspecified atrial fibrillation: Secondary | ICD-10-CM | POA: Diagnosis not present

## 2022-02-01 DIAGNOSIS — I110 Hypertensive heart disease with heart failure: Secondary | ICD-10-CM | POA: Diagnosis not present

## 2022-02-01 DIAGNOSIS — F319 Bipolar disorder, unspecified: Secondary | ICD-10-CM | POA: Diagnosis not present

## 2022-02-01 DIAGNOSIS — I509 Heart failure, unspecified: Secondary | ICD-10-CM | POA: Diagnosis not present

## 2022-02-01 DIAGNOSIS — G4733 Obstructive sleep apnea (adult) (pediatric): Secondary | ICD-10-CM | POA: Diagnosis not present

## 2022-02-01 DIAGNOSIS — E785 Hyperlipidemia, unspecified: Secondary | ICD-10-CM | POA: Diagnosis not present

## 2022-02-04 DIAGNOSIS — I4891 Unspecified atrial fibrillation: Secondary | ICD-10-CM | POA: Diagnosis not present

## 2022-02-04 DIAGNOSIS — F319 Bipolar disorder, unspecified: Secondary | ICD-10-CM | POA: Diagnosis not present

## 2022-02-04 DIAGNOSIS — G4733 Obstructive sleep apnea (adult) (pediatric): Secondary | ICD-10-CM | POA: Diagnosis not present

## 2022-02-04 DIAGNOSIS — I509 Heart failure, unspecified: Secondary | ICD-10-CM | POA: Diagnosis not present

## 2022-02-04 DIAGNOSIS — I110 Hypertensive heart disease with heart failure: Secondary | ICD-10-CM | POA: Diagnosis not present

## 2022-02-04 DIAGNOSIS — E785 Hyperlipidemia, unspecified: Secondary | ICD-10-CM | POA: Diagnosis not present

## 2022-02-05 DIAGNOSIS — I110 Hypertensive heart disease with heart failure: Secondary | ICD-10-CM | POA: Diagnosis not present

## 2022-02-05 DIAGNOSIS — I4891 Unspecified atrial fibrillation: Secondary | ICD-10-CM | POA: Diagnosis not present

## 2022-02-05 DIAGNOSIS — G4733 Obstructive sleep apnea (adult) (pediatric): Secondary | ICD-10-CM | POA: Diagnosis not present

## 2022-02-05 DIAGNOSIS — F319 Bipolar disorder, unspecified: Secondary | ICD-10-CM | POA: Diagnosis not present

## 2022-02-05 DIAGNOSIS — I509 Heart failure, unspecified: Secondary | ICD-10-CM | POA: Diagnosis not present

## 2022-02-05 DIAGNOSIS — Z9981 Dependence on supplemental oxygen: Secondary | ICD-10-CM | POA: Diagnosis not present

## 2022-02-05 DIAGNOSIS — F4310 Post-traumatic stress disorder, unspecified: Secondary | ICD-10-CM | POA: Diagnosis not present

## 2022-02-05 DIAGNOSIS — I70209 Unspecified atherosclerosis of native arteries of extremities, unspecified extremity: Secondary | ICD-10-CM | POA: Diagnosis not present

## 2022-02-05 DIAGNOSIS — E785 Hyperlipidemia, unspecified: Secondary | ICD-10-CM | POA: Diagnosis not present

## 2022-02-05 DIAGNOSIS — I25119 Atherosclerotic heart disease of native coronary artery with unspecified angina pectoris: Secondary | ICD-10-CM | POA: Diagnosis not present

## 2022-02-05 DIAGNOSIS — J449 Chronic obstructive pulmonary disease, unspecified: Secondary | ICD-10-CM | POA: Diagnosis not present

## 2022-02-07 ENCOUNTER — Other Ambulatory Visit (HOSPITAL_COMMUNITY): Payer: Self-pay

## 2022-02-07 DIAGNOSIS — F319 Bipolar disorder, unspecified: Secondary | ICD-10-CM | POA: Diagnosis not present

## 2022-02-07 DIAGNOSIS — G4733 Obstructive sleep apnea (adult) (pediatric): Secondary | ICD-10-CM | POA: Diagnosis not present

## 2022-02-07 DIAGNOSIS — I5032 Chronic diastolic (congestive) heart failure: Secondary | ICD-10-CM

## 2022-02-07 DIAGNOSIS — I110 Hypertensive heart disease with heart failure: Secondary | ICD-10-CM | POA: Diagnosis not present

## 2022-02-07 DIAGNOSIS — I4891 Unspecified atrial fibrillation: Secondary | ICD-10-CM | POA: Diagnosis not present

## 2022-02-07 DIAGNOSIS — E785 Hyperlipidemia, unspecified: Secondary | ICD-10-CM | POA: Diagnosis not present

## 2022-02-07 DIAGNOSIS — I509 Heart failure, unspecified: Secondary | ICD-10-CM | POA: Diagnosis not present

## 2022-02-07 NOTE — Progress Notes (Signed)
Orders Placed This Encounter  Procedures   ECHOCARDIOGRAM COMPLETE    Standing Status:   Future    Standing Expiration Date:   02/08/2023    Order Specific Question:   Where should this test be performed    Answer:   [facility]    Order Specific Question:   Perflutren DEFINITY (image enhancing agent) should be administered unless hypersensitivity or allergy exist    Answer:   Administer Perflutren    Order Specific Question:   Reason for exam-Echo    Answer:   Congestive Heart Failure  I50.9    Order Specific Question:   Release to patient    Answer:   Immediate

## 2022-02-07 NOTE — Progress Notes (Signed)
Orders Placed This Encounter  Procedures   ECHOCARDIOGRAM COMPLETE    Standing Status:   Future    Standing Expiration Date:   02/08/2023    Order Specific Question:   Where should this test be performed    Answer:   [facility]    Order Specific Question:   Perflutren DEFINITY (image enhancing agent) should be administered unless hypersensitivity or allergy exist    Answer:   Administer Perflutren    Order Specific Question:   Reason for exam-Echo    Answer:   Congestive Heart Failure  I50.9    Order Specific Question:   Release to patient    Answer:   Immediate

## 2022-02-08 DIAGNOSIS — I110 Hypertensive heart disease with heart failure: Secondary | ICD-10-CM | POA: Diagnosis not present

## 2022-02-08 DIAGNOSIS — F319 Bipolar disorder, unspecified: Secondary | ICD-10-CM | POA: Diagnosis not present

## 2022-02-08 DIAGNOSIS — E785 Hyperlipidemia, unspecified: Secondary | ICD-10-CM | POA: Diagnosis not present

## 2022-02-08 DIAGNOSIS — I509 Heart failure, unspecified: Secondary | ICD-10-CM | POA: Diagnosis not present

## 2022-02-08 DIAGNOSIS — G4733 Obstructive sleep apnea (adult) (pediatric): Secondary | ICD-10-CM | POA: Diagnosis not present

## 2022-02-08 DIAGNOSIS — I4891 Unspecified atrial fibrillation: Secondary | ICD-10-CM | POA: Diagnosis not present

## 2022-02-09 DIAGNOSIS — G4733 Obstructive sleep apnea (adult) (pediatric): Secondary | ICD-10-CM | POA: Diagnosis not present

## 2022-02-09 DIAGNOSIS — I110 Hypertensive heart disease with heart failure: Secondary | ICD-10-CM | POA: Diagnosis not present

## 2022-02-09 DIAGNOSIS — I4891 Unspecified atrial fibrillation: Secondary | ICD-10-CM | POA: Diagnosis not present

## 2022-02-09 DIAGNOSIS — F319 Bipolar disorder, unspecified: Secondary | ICD-10-CM | POA: Diagnosis not present

## 2022-02-09 DIAGNOSIS — E785 Hyperlipidemia, unspecified: Secondary | ICD-10-CM | POA: Diagnosis not present

## 2022-02-09 DIAGNOSIS — I509 Heart failure, unspecified: Secondary | ICD-10-CM | POA: Diagnosis not present

## 2022-02-12 ENCOUNTER — Ambulatory Visit (HOSPITAL_COMMUNITY): Admission: RE | Admit: 2022-02-12 | Payer: Medicare Other | Source: Ambulatory Visit

## 2022-02-12 ENCOUNTER — Encounter (HOSPITAL_COMMUNITY): Payer: Medicare Other | Admitting: Cardiology

## 2022-02-14 ENCOUNTER — Other Ambulatory Visit (HOSPITAL_COMMUNITY): Payer: Medicare Other

## 2022-02-22 ENCOUNTER — Ambulatory Visit: Payer: Medicare Other | Admitting: Acute Care

## 2022-02-26 ENCOUNTER — Ambulatory Visit: Payer: Medicare Other | Admitting: Nurse Practitioner

## 2022-03-08 DEATH — deceased
